# Patient Record
Sex: Male | Born: 1940 | Race: White | Hispanic: No | Marital: Married | Smoking: Former smoker
Health system: Southern US, Community
[De-identification: ages and names within clinical notes are randomized; demographics above are authoritative.]

## PROBLEM LIST (undated history)

## (undated) DIAGNOSIS — I5042 Chronic combined systolic (congestive) and diastolic (congestive) heart failure: Secondary | ICD-10-CM

## (undated) DIAGNOSIS — J439 Emphysema, unspecified: Secondary | ICD-10-CM

## (undated) DIAGNOSIS — F329 Major depressive disorder, single episode, unspecified: Secondary | ICD-10-CM

## (undated) DIAGNOSIS — I2510 Atherosclerotic heart disease of native coronary artery without angina pectoris: Secondary | ICD-10-CM

## (undated) DIAGNOSIS — I429 Cardiomyopathy, unspecified: Secondary | ICD-10-CM

## (undated) DIAGNOSIS — F419 Anxiety disorder, unspecified: Secondary | ICD-10-CM

## (undated) DIAGNOSIS — M109 Gout, unspecified: Secondary | ICD-10-CM

## (undated) DIAGNOSIS — E785 Hyperlipidemia, unspecified: Secondary | ICD-10-CM

## (undated) DIAGNOSIS — N200 Calculus of kidney: Secondary | ICD-10-CM

## (undated) DIAGNOSIS — R102 Pelvic and perineal pain: Secondary | ICD-10-CM

## (undated) DIAGNOSIS — M1990 Unspecified osteoarthritis, unspecified site: Secondary | ICD-10-CM

## (undated) DIAGNOSIS — N183 Chronic kidney disease, stage 3 unspecified: Secondary | ICD-10-CM

## (undated) DIAGNOSIS — R634 Abnormal weight loss: Secondary | ICD-10-CM

## (undated) DIAGNOSIS — I480 Paroxysmal atrial fibrillation: Secondary | ICD-10-CM

## (undated) DIAGNOSIS — E119 Type 2 diabetes mellitus without complications: Secondary | ICD-10-CM

## (undated) DIAGNOSIS — I10 Essential (primary) hypertension: Secondary | ICD-10-CM

## (undated) DIAGNOSIS — C61 Malignant neoplasm of prostate: Secondary | ICD-10-CM

## (undated) DIAGNOSIS — D649 Anemia, unspecified: Secondary | ICD-10-CM

## (undated) DIAGNOSIS — F32A Depression, unspecified: Secondary | ICD-10-CM

## (undated) DIAGNOSIS — K259 Gastric ulcer, unspecified as acute or chronic, without hemorrhage or perforation: Secondary | ICD-10-CM

## (undated) HISTORY — PX: APPENDECTOMY: SHX54

## (undated) HISTORY — PX: GASTRIC BYPASS: SHX52

## (undated) HISTORY — PX: BACK SURGERY: SHX140

## (undated) HISTORY — PX: CHOLECYSTECTOMY: SHX55

## (undated) HISTORY — PX: HEMORRHOID SURGERY: SHX153

## (undated) HISTORY — PX: OTHER SURGICAL HISTORY: SHX169

---

## 2003-11-17 ENCOUNTER — Other Ambulatory Visit: Payer: Self-pay

## 2004-01-22 ENCOUNTER — Ambulatory Visit: Payer: Self-pay | Admitting: Internal Medicine

## 2004-02-22 ENCOUNTER — Ambulatory Visit: Payer: Self-pay | Admitting: Internal Medicine

## 2004-07-31 ENCOUNTER — Ambulatory Visit: Payer: Self-pay | Admitting: Internal Medicine

## 2004-08-21 ENCOUNTER — Ambulatory Visit: Payer: Self-pay | Admitting: Internal Medicine

## 2006-06-25 ENCOUNTER — Other Ambulatory Visit: Payer: Self-pay

## 2006-06-25 ENCOUNTER — Ambulatory Visit: Payer: Self-pay | Admitting: Urology

## 2006-07-03 ENCOUNTER — Inpatient Hospital Stay: Payer: Self-pay | Admitting: Urology

## 2007-05-07 ENCOUNTER — Ambulatory Visit: Payer: Self-pay | Admitting: Rheumatology

## 2007-08-15 ENCOUNTER — Ambulatory Visit: Payer: Self-pay | Admitting: Internal Medicine

## 2007-10-09 ENCOUNTER — Ambulatory Visit: Payer: Self-pay | Admitting: Gastroenterology

## 2008-11-21 ENCOUNTER — Ambulatory Visit: Payer: Self-pay | Admitting: Radiation Oncology

## 2008-12-07 ENCOUNTER — Ambulatory Visit: Payer: Self-pay | Admitting: Urology

## 2008-12-08 ENCOUNTER — Ambulatory Visit: Payer: Self-pay | Admitting: Urology

## 2008-12-13 ENCOUNTER — Ambulatory Visit: Payer: Self-pay | Admitting: Radiation Oncology

## 2008-12-22 ENCOUNTER — Ambulatory Visit: Payer: Self-pay | Admitting: Radiation Oncology

## 2009-01-21 ENCOUNTER — Ambulatory Visit: Payer: Self-pay | Admitting: Radiation Oncology

## 2009-02-21 ENCOUNTER — Ambulatory Visit: Payer: Self-pay | Admitting: Radiation Oncology

## 2009-03-23 ENCOUNTER — Ambulatory Visit: Payer: Self-pay | Admitting: Radiation Oncology

## 2009-04-23 ENCOUNTER — Ambulatory Visit: Payer: Self-pay | Admitting: Radiation Oncology

## 2009-07-22 ENCOUNTER — Ambulatory Visit: Payer: Self-pay | Admitting: Radiation Oncology

## 2009-07-25 ENCOUNTER — Ambulatory Visit: Payer: Self-pay | Admitting: Radiation Oncology

## 2009-08-21 ENCOUNTER — Ambulatory Visit: Payer: Self-pay | Admitting: Radiation Oncology

## 2009-12-22 ENCOUNTER — Ambulatory Visit: Payer: Self-pay | Admitting: Radiation Oncology

## 2010-01-11 ENCOUNTER — Ambulatory Visit: Payer: Self-pay | Admitting: Radiation Oncology

## 2010-01-21 ENCOUNTER — Ambulatory Visit: Payer: Self-pay | Admitting: Radiation Oncology

## 2011-01-10 ENCOUNTER — Ambulatory Visit: Payer: Self-pay | Admitting: Radiation Oncology

## 2011-01-22 ENCOUNTER — Ambulatory Visit: Payer: Self-pay | Admitting: Radiation Oncology

## 2012-01-11 ENCOUNTER — Ambulatory Visit: Payer: Self-pay | Admitting: Radiation Oncology

## 2012-01-22 ENCOUNTER — Ambulatory Visit: Payer: Self-pay | Admitting: Radiation Oncology

## 2013-01-12 ENCOUNTER — Ambulatory Visit: Payer: Self-pay | Admitting: Radiation Oncology

## 2013-01-21 ENCOUNTER — Ambulatory Visit: Payer: Self-pay | Admitting: Radiation Oncology

## 2014-01-13 ENCOUNTER — Ambulatory Visit: Payer: Self-pay | Admitting: Radiation Oncology

## 2014-01-15 LAB — PSA: PSA: <0.1 ng/mL (ref 0.0–4.0)

## 2014-01-21 ENCOUNTER — Ambulatory Visit: Payer: Self-pay | Admitting: Radiation Oncology

## 2014-02-24 ENCOUNTER — Ambulatory Visit: Payer: Self-pay | Admitting: Internal Medicine

## 2015-01-11 ENCOUNTER — Other Ambulatory Visit: Payer: Self-pay | Admitting: *Deleted

## 2015-01-11 DIAGNOSIS — C61 Malignant neoplasm of prostate: Secondary | ICD-10-CM

## 2015-01-13 ENCOUNTER — Other Ambulatory Visit: Payer: Self-pay | Admitting: *Deleted

## 2015-01-13 ENCOUNTER — Encounter: Payer: Self-pay | Admitting: Radiation Oncology

## 2015-01-13 ENCOUNTER — Inpatient Hospital Stay: Payer: Medicare Other | Attending: Radiation Oncology

## 2015-01-13 ENCOUNTER — Ambulatory Visit
Admission: RE | Admit: 2015-01-13 | Discharge: 2015-01-13 | Disposition: A | Discharge status: Home / Self Care | Payer: Medicare Other | Source: Ambulatory Visit | Attending: Radiation Oncology | Admitting: Radiation Oncology

## 2015-01-13 VITALS — BP 155/84 | HR 87 | Temp 97.5°F | Ht 68.0 in | Wt 182.4 lb

## 2015-01-13 DIAGNOSIS — C61 Malignant neoplasm of prostate: Secondary | ICD-10-CM

## 2015-01-13 HISTORY — DX: Type 2 diabetes mellitus without complications: E11.9

## 2015-01-13 HISTORY — DX: Unspecified osteoarthritis, unspecified site: M19.90

## 2015-01-13 HISTORY — DX: Malignant neoplasm of prostate: C61

## 2015-01-13 LAB — PSA: PSA: 0.01 ng/mL (ref 0.00–4.00)

## 2015-01-13 NOTE — Progress Notes (Signed)
Radiation Oncology Follow up Note  Name: Gregory Tyler   Date:   01/13/2015 MRN:  [id number] DOB: [birth year]    This 74 y.o. male presents to the clinic today for follow-up for prostate cancer.  REFERRING Davon Abdelaziz: No ref. 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 He is seen today in routine follow-up and is doing fairly well. His last PSA was less than 0.01 we have repeated that today. His major complaint is some urinary incontinence which seems to be stress related but is worsening over time. He's having no diarrhea..  COMPLICATIONS OF TREATMENT: none  FOLLOW UP COMPLIANCE: keeps appointments   PHYSICAL EXAM:  BP 155/84 mmHg  Pulse 87  Temp(Src) 97.5 F (36.4 C)  Ht 5\' 8"  (1.727 m)  Wt 182 lb 6.9 oz (82.75 kg)  BMI 27.74 kg/m2 On rectal exam rectal sphincter tone is good prostatic fossa is clear without evidence of nodularity or mass no other rectal abnormalities identified. Well-developed well-nourished patient in NAD. HEENT reveals PERLA, EOMI, discs not visualized.  Oral cavity is clear. No oral mucosal lesions are identified. Neck is clear without evidence of cervical or supraclavicular adenopathy. Lungs are clear to A&P. Cardiac examination is essentially unremarkable with regular rate and rhythm without murmur rub or thrill. Abdomen is benign with no organomegaly or masses noted. Motor sensory and DTR levels are equal and symmetric in the upper and lower extremities. Cranial nerves II through XII are grossly intact. Proprioception is intact. No peripheral adenopathy or edema is identified. No motor or sensory levels are noted. Crude visual fields are within normal range.   RADIOLOGY RESULTS: No current films for review  PLAN: Present time he is doing well under good biochemical control of his prostate cancer. I have repeated PSA  level today and will report that separately. I have suggested he sees Dr. Jacqlyn Larsen in follow-up which she has been requested to do to discuss some of his urinary symptoms. Otherwise I've asked to see him back in 1 year for follow-up. Patient knows to call with any concerns.  I would like to take this opportunity for allowing me to participate in the care of your patient.Armstead Peaks., MD

## 2015-02-08 ENCOUNTER — Emergency Department: Payer: Medicare Other

## 2015-02-08 ENCOUNTER — Emergency Department
Admission: EM | Admit: 2015-02-08 | Discharge: 2015-02-08 | Disposition: A | Discharge status: Home / Self Care | Payer: Medicare Other | Attending: Emergency Medicine | Admitting: Emergency Medicine

## 2015-02-08 ENCOUNTER — Encounter: Payer: Self-pay | Admitting: *Deleted

## 2015-02-08 DIAGNOSIS — W268XXA Contact with other sharp object(s), not elsewhere classified, initial encounter: Secondary | ICD-10-CM | POA: Insufficient documentation

## 2015-02-08 DIAGNOSIS — E119 Type 2 diabetes mellitus without complications: Secondary | ICD-10-CM | POA: Insufficient documentation

## 2015-02-08 DIAGNOSIS — S90112A Contusion of left great toe without damage to nail, initial encounter: Secondary | ICD-10-CM | POA: Insufficient documentation

## 2015-02-08 DIAGNOSIS — L03032 Cellulitis of left toe: Secondary | ICD-10-CM | POA: Diagnosis not present

## 2015-02-08 DIAGNOSIS — Y9289 Other specified places as the place of occurrence of the external cause: Secondary | ICD-10-CM | POA: Diagnosis not present

## 2015-02-08 DIAGNOSIS — S99922A Unspecified injury of left foot, initial encounter: Secondary | ICD-10-CM | POA: Diagnosis present

## 2015-02-08 DIAGNOSIS — Y9389 Activity, other specified: Secondary | ICD-10-CM | POA: Diagnosis not present

## 2015-02-08 DIAGNOSIS — Z79899 Other long term (current) drug therapy: Secondary | ICD-10-CM | POA: Insufficient documentation

## 2015-02-08 DIAGNOSIS — I10 Essential (primary) hypertension: Secondary | ICD-10-CM | POA: Diagnosis not present

## 2015-02-08 DIAGNOSIS — Y998 Other external cause status: Secondary | ICD-10-CM | POA: Diagnosis not present

## 2015-02-08 DIAGNOSIS — R0609 Other forms of dyspnea: Secondary | ICD-10-CM | POA: Insufficient documentation

## 2015-02-08 DIAGNOSIS — Z87891 Personal history of nicotine dependence: Secondary | ICD-10-CM | POA: Diagnosis not present

## 2015-02-08 HISTORY — DX: Essential (primary) hypertension: I10

## 2015-02-08 LAB — BASIC METABOLIC PANEL
Anion gap: 8 (ref 5–15)
BUN: 21 mg/dL — AB (ref 6–20)
CHLORIDE: 110 mmol/L (ref 101–111)
CO2: 23 mmol/L (ref 22–32)
CREATININE: 1.33 mg/dL — AB (ref 0.61–1.24)
Calcium: 9.3 mg/dL (ref 8.9–10.3)
GFR calc Af Amer: 59 mL/min — ABNORMAL LOW (ref >60)
GFR calc non Af Amer: 51 mL/min — ABNORMAL LOW (ref >60)
Glucose, Bld: 214 mg/dL — ABNORMAL HIGH (ref 65–99)
Potassium: 4.8 mmol/L (ref 3.5–5.1)
SODIUM: 141 mmol/L (ref 135–145)

## 2015-02-08 LAB — CBC
HCT: 38 % — ABNORMAL LOW (ref 40.0–52.0)
HEMOGLOBIN: 12.4 g/dL — AB (ref 13.0–18.0)
MCH: 30.1 pg (ref 26.0–34.0)
MCHC: 32.6 g/dL (ref 32.0–36.0)
MCV: 92.2 fL (ref 80.0–100.0)
PLATELETS: 203 10*3/uL (ref 150–440)
RBC: 4.13 MIL/uL — ABNORMAL LOW (ref 4.40–5.90)
RDW: 15.2 % — ABNORMAL HIGH (ref 11.5–14.5)
WBC: 6.7 10*3/uL (ref 3.8–10.6)

## 2015-02-08 LAB — BRAIN NATRIURETIC PEPTIDE: B NATRIURETIC PEPTIDE 5: 589 pg/mL — AB (ref 0.0–100.0)

## 2015-02-08 MED ORDER — CLINDAMYCIN HCL 150 MG PO CAPS: 450.0000 mg | ORAL_CAPSULE | Freq: Three times a day (TID) | ORAL | Status: DC

## 2015-02-08 NOTE — Discharge Instructions (Signed)
Cellulitis Cellulitis is an infection of the skin and the tissue beneath it. The infected area is usually red and tender. Cellulitis occurs most often in the arms and lower legs.  CAUSES  Cellulitis is caused by bacteria that enter the skin through cracks or cuts in the skin. The most common types of bacteria that cause cellulitis are staphylococci and streptococci. SIGNS AND SYMPTOMS   Redness and warmth.  Swelling.  Tenderness or pain.  Fever. DIAGNOSIS  Your health care provider can usually determine what is wrong based on a physical exam. Blood tests may also be done. TREATMENT  Treatment usually involves taking an antibiotic medicine. HOME CARE INSTRUCTIONS   Take your antibiotic medicine as directed by your health care provider. Finish the antibiotic even if you start to feel better.  Keep the infected arm or leg elevated to reduce swelling.  Apply a warm cloth to the affected area up to 4 times per day to relieve pain.  Take medicines only as directed by your health care provider.  Keep all follow-up visits as directed by your health care provider. SEEK MEDICAL CARE IF:   You notice red streaks coming from the infected area.  Your red area gets larger or turns dark in color.  Your bone or joint underneath the infected area becomes painful after the skin has healed.  Your infection returns in the same area or another area.  You notice a swollen bump in the infected area.  You develop new symptoms.  You have a fever. SEEK IMMEDIATE MEDICAL CARE IF:   You feel very sleepy.  You develop vomiting or diarrhea.  You have a general ill feeling (malaise) with muscle aches and pains.   This information is not intended to replace advice given to you by your health care provider. Make sure you discuss any questions you have with your health care provider.   Document Released: 01/17/2005 Document Revised: 12/29/2014 Document Reviewed: 06/25/2011 Elsevier Interactive  Patient Education 2016 [place] A contusion is a deep bruise. Contusions are the result of a blunt injury to tissues and muscle fibers under the skin. The injury causes bleeding under the skin. The skin overlying the contusion may turn blue, purple, or yellow. Minor injuries will give you a painless contusion, but more severe contusions may stay painful and swollen for a few weeks.  CAUSES  This condition is usually caused by a blow, trauma, or direct force to an area of the body. SYMPTOMS  Symptoms of this condition include:  Swelling of the injured area.  Pain and tenderness in the injured area.  Discoloration. The area may have redness and then turn blue, purple, or yellow. DIAGNOSIS  This condition is diagnosed based on a physical exam and medical history. An X-ray, CT scan, or MRI may be needed to determine if there are any associated injuries, such as broken bones (fractures). TREATMENT  Specific treatment for this condition depends on what area of the body was injured. In general, the best treatment for a contusion is resting, icing, applying pressure to (compression), and elevating the injured area. This is often called the RICE strategy. Over-the-counter anti-inflammatory medicines may also be recommended for pain control.  HOME CARE INSTRUCTIONS   Rest the injured area.  If directed, apply ice to the injured area:  Put ice in a plastic bag.  Place a towel between your skin and the bag.  Leave the ice on for 20 minutes, 2-3 times per day.  If  directed, apply light compression to the injured area using an elastic bandage. Make sure the bandage is not wrapped too tightly. Remove and reapply the bandage as directed by your health care provider.  If possible, raise (elevate) the injured area above the level of your heart while you are sitting or lying down.  Take over-the-counter and prescription medicines only as told by your health care provider. SEEK  MEDICAL CARE IF:  Your symptoms do not improve after several days of treatment.  Your symptoms get worse.  You have difficulty moving the injured area. SEEK IMMEDIATE MEDICAL CARE IF:   You have severe pain.  You have numbness in a hand or foot.  Your hand or foot turns pale or cold.   This information is not intended to replace advice given to you by your health care provider. Make sure you discuss any questions you have with your health care provider.   Document Released: 01/17/2005 Document Revised: 12/29/2014 Document Reviewed: 08/25/2014 Elsevier Interactive Patient Education 2016 [place] of Breath Shortness of breath means you have trouble breathing. It could also mean that you have a medical problem. You should get immediate medical care for shortness of breath. CAUSES   Not enough oxygen in the air such as with high altitudes or a smoke-filled room.  Certain lung diseases, infections, or problems.  Heart disease or conditions, such as angina or heart failure.  Low red blood cells (anemia).  Poor physical fitness, which can cause shortness of breath when you exercise.  Chest or back injuries or stiffness.  Being overweight.  Smoking.  Anxiety, which can make you feel like you are not getting enough air. DIAGNOSIS  Serious medical problems can often be found during your physical exam. Tests may also be done to determine why you are having shortness of breath. Tests may include:  Chest X-rays.  Lung function tests.  Blood tests.  An electrocardiogram (ECG).  An ambulatory electrocardiogram. An ambulatory ECG records your heartbeat patterns over a 24-hour period.  Exercise testing.  A transthoracic echocardiogram (TTE). During echocardiography, sound waves are used to evaluate how blood flows through your heart.  A transesophageal echocardiogram (TEE).  Imaging scans. Your health care provider may not be able to find a cause for your  shortness of breath after your exam. In this case, it is important to have a follow-up exam with your health care provider as directed.  TREATMENT  Treatment for shortness of breath depends on the cause of your symptoms and can vary greatly. HOME CARE INSTRUCTIONS   Do not smoke. Smoking is a common cause of shortness of breath. If you smoke, ask for help to quit.  Avoid being around chemicals or things that may bother your breathing, such as paint fumes and dust.  Rest as needed. Slowly resume your usual activities.  If medicines were prescribed, take them as directed for the full length of time directed. This includes oxygen and any inhaled medicines.  Keep all follow-up appointments as directed by your health care provider. SEEK MEDICAL CARE IF:   Your condition does not improve in the time expected.  You have a hard time doing your normal activities even with rest.  You have any new symptoms. SEEK IMMEDIATE MEDICAL CARE IF:   Your shortness of breath gets worse.  You feel light-headed, faint, or develop a cough not controlled with medicines.  You start coughing up blood.  You have pain with breathing.  You have chest pain or  pain in your arms, shoulders, or abdomen.  You have a fever.  You are unable to walk up stairs or exercise the way you normally do. MAKE SURE YOU:  Understand these instructions.  Will watch your condition.  Will get help right away if you are not doing well or get worse.   This information is not intended to replace advice given to you by your health care provider. Make sure you discuss any questions you have with your health care provider.   Document Released: 01/02/2001 Document Revised: 04/14/2013 Document Reviewed: 06/25/2011 Elsevier Interactive Patient Education [employer].

## 2015-02-08 NOTE — ED Provider Notes (Signed)
[facility] Emergency Department Provider Note  ____________________________________________  Time seen: 5:25 PM  I have reviewed the triage vital signs and the nursing notes.   HISTORY  Chief Complaint Shortness of Breath and Toe Injury    HPI Gregory Tyler is a 74 y.o. male who complains of pain in the left great toe for the past 3 days. He was driving a stake in the ground with a sledgehammer when he missed and hit his left foot. He was only wearing a tennis shoe at the time. Initially was painful and swollen, which got better, but since yesterday he has had worsening redness swelling and pain in the left great toe. He does have diabetes.  No other acute symptoms. He has chronic exertional dyspnea which has gradually progressed over the last months to year. He does still do yard work and was recently building a Physicist, medical outside. He is a poor historian and unable to quantify any change in exercise tolerance, but does feel like it is subjectively diminished. He sleeps on 3 pillows at home for the past several weeks, but I'm unable to ascertain whether he has paroxysmal nocturnal dyspnea or frank orthopnea     Past Medical History  Diagnosis Date  . Prostate cancer ([place])   . Arthritis   . Diabetes mellitus without complication ([place])   . Hypertension   . CHF (congestive heart failure) ([place])      There are no active problems to display for this patient.    Past Surgical History  Procedure Laterality Date  . Cholecystectomy    . Appendectomy    . Gastric bypass    . Hemorrhoid surgery    . Back surgery    . Pilonidal cyst       Current Outpatient Rx  Name  Route  Sig  Dispense  Refill  . allopurinol (ZYLOPRIM) 300 MG tablet   Oral   Take by mouth.         . benazepril (LOTENSIN) 20 MG tablet   Oral   Take by mouth.         . clindamycin (CLEOCIN) 150 MG capsule   Oral   Take 3 capsules (450 mg total) by mouth 3 (three) times  daily.   90 capsule   0   . HYDROcodone-acetaminophen (NORCO/VICODIN) 5-325 MG per tablet   Oral   Take by mouth.         . hydrocortisone (ANUSOL-HC) 25 MG suppository   Rectal   Place rectally.         . isosorbide mononitrate (IMDUR) 30 MG 24 hr tablet   Oral   Take by mouth.         . magnesium oxide (MAG-OX) 400 MG tablet   Oral   Take by mouth.         . metFORMIN (GLUCOPHAGE) 500 MG tablet   Oral   Take by mouth.         . EXPIRED: metoprolol tartrate (LOPRESSOR) 25 MG tablet   Oral   Take by mouth.         . simvastatin (ZOCOR) 20 MG tablet   Oral   Take by mouth.            Allergies Codeine sulfate   No family history on file.  Social History Social History  Substance Use Topics  . Smoking status: Former Research scientist (life sciences)  . Smokeless tobacco: Current User  . Alcohol Use: No    Review of  Systems  Constitutional:   No fever or chills. No weight changes Eyes:   No blurry vision or double vision.  ENT:   No sore throat. Cardiovascular:   No chest pain. Respiratory:   Chronic dyspnea on exertion. No cough Gastrointestinal:   Negative for abdominal pain, vomiting and diarrhea.  No BRBPR or melena. Genitourinary:   Negative for dysuria, urinary retention, bloody urine, or difficulty urinating. Musculoskeletal:   Negative for back pain. Left great toe pain and swelling as above. Skin:   Negative for rash. Neurological:   Negative for headaches, focal weakness or numbness. Psychiatric:  No anxiety or depression.   Endocrine:  No hot/cold intolerance, changes in energy, or sleep difficulty.  10-point ROS otherwise negative.  ____________________________________________   PHYSICAL EXAM:  VITAL SIGNS: ED Triage Vitals  Enc Vitals Group     BP 02/08/15 1513 161/95 mmHg     Pulse Rate 02/08/15 1513 98     Resp 02/08/15 1513 18     Temp 02/08/15 1513 98.4 F (36.9 C)     Temp Source 02/08/15 1513 Oral     SpO2 02/08/15 1513 98 %      Weight 02/08/15 1513 182 lb (82.555 kg)     Height 02/08/15 1513 5\' 8"  (1.727 m)     Head Cir --      Peak Flow --      Pain Score 02/08/15 1513 10     Pain Loc --      Pain Edu? --      Excl. in [place]? --      Constitutional:   Alert and oriented. Well appearing and in no distress. Eyes:   No scleral icterus. No conjunctival pallor. PERRL. EOMI ENT   Head:   Normocephalic and atraumatic.   Nose:   No congestion/rhinnorhea. No septal hematoma   Mouth/Throat:   MMM, no pharyngeal erythema. No peritonsillar mass. No uvula shift.   Neck:   No stridor. No SubQ emphysema. No meningismus. No JVD, no hepatojugular reflex Hematological/Lymphatic/Immunilogical:   No cervical lymphadenopathy. Cardiovascular:   Irregularly irregular rhythm, rate controlled. Normal and symmetric distal pulses are present in all extremities. No murmurs, rubs, or gallops. Respiratory:   Normal respiratory effort without tachypnea nor retractions. Breath sounds are clear and equal bilaterally. No wheezes/rales/rhonchi. Tolerates lying supine without symptoms or distress. Gastrointestinal:   Soft and nontender. No distention. There is no CVA tenderness.  No rebound, rigidity, or guarding. Genitourinary:   deferred Musculoskeletal:  There is erythema tenderness and warmth of the distal left great toe just at the tip around the pulp. There is diffuse onychomycosis. The left great toenail is dusky and loose and likely to eminently fall off. Full range of motion, no bony tenderness. The rest of the left lower shoulder and other chemistries are unremarkable. Neurologic:   Normal speech and language.  CN 2-10 normal. Motor grossly intact. No pronator drift.  Normal gait. No gross focal neurologic deficits are appreciated.  Skin:    Skin is warm, dry and intact. No rash noted.  No petechiae, purpura, or bullae. Psychiatric:   Mood and affect are normal. Speech and behavior are normal. Patient exhibits appropriate  insight and judgment.  ____________________________________________    LABS (pertinent positives/negatives) (all labs ordered are listed, but only abnormal results are displayed) Labs Reviewed  CBC - Abnormal; Notable for the following:    RBC 4.13 (*)    Hemoglobin 12.4 (*)    HCT 38.0 (*)  RDW 15.2 (*)    All other components within normal limits  BASIC METABOLIC PANEL - Abnormal; Notable for the following:    Glucose, Bld 214 (*)    BUN 21 (*)    Creatinine, Ser 1.33 (*)    GFR calc non Af Amer 51 (*)    GFR calc Af Amer 59 (*)    All other components within normal limits  BRAIN NATRIURETIC PEPTIDE - Abnormal; Notable for the following:    B Natriuretic Peptide 589.0 (*)    All other components within normal limits   ____________________________________________   EKG  Interpreted by me Atrial fibrillation rate of 95. Left axis, normal intervals. Poor R progression in anterior precordial leads. Normal ST segments and T waves  ____________________________________________    RADIOLOGY  Chest x-ray unremarkable X-ray left foot unremarkable  ____________________________________________   PROCEDURES   ____________________________________________   INITIAL IMPRESSION / ASSESSMENT AND PLAN / ED COURSE  Pertinent labs & imaging results that were available during my care of the patient were reviewed by me and considered in my medical decision making (see chart for details).  Patient presents with chronic exertional dyspnea which may be related to some degree of heart failure, but does not have any evidence on exam or workup of acute heart failure or ACS today. Suspicion for PE or pneumothorax or other cardiopulmonary pathology acutely. With his diabetes and worsened symptoms of the left great toe, concerned that he is developing a superimposed cellulitis due to the recent injury. We'll start him on clindamycin. He has a follow-up plan with his primary care doctor,  Dr. Doy Hutching in 6 days, so he went to be reassessed at that point. There is no open laceration or other wound and is to be repaired. The patient that the toenails likely to fall off. No sepsis, otherwise no distress nontoxic well-appearing.     ____________________________________________   FINAL CLINICAL IMPRESSION(S) / ED DIAGNOSES  Final diagnoses:  Contusion of great toe, left, initial encounter  Cellulitis of toe of left foot  DOE (dyspnea on exertion)      Carrie Mew, MD 02/08/15 1751

## 2015-02-08 NOTE — ED Notes (Signed)
Pt reports that over the past two weeks he has felt shortness of breath, when he lays down at night to go to bed and with exertional activities. Pt also hit his left foot great toe with a sledge hammer on Saturday, c/o pain and redness in his foot.

## 2015-04-24 ENCOUNTER — Inpatient Hospital Stay
Admission: EM | Admit: 2015-04-24 | Discharge: 2015-04-27 | DRG: 291 | Disposition: A | Discharge status: Home / Self Care | Payer: Medicare Other | Attending: Specialist | Admitting: Specialist

## 2015-04-24 DIAGNOSIS — Z720 Tobacco use: Secondary | ICD-10-CM

## 2015-04-24 DIAGNOSIS — R0600 Dyspnea, unspecified: Secondary | ICD-10-CM | POA: Diagnosis not present

## 2015-04-24 DIAGNOSIS — Z9884 Bariatric surgery status: Secondary | ICD-10-CM

## 2015-04-24 DIAGNOSIS — I4891 Unspecified atrial fibrillation: Secondary | ICD-10-CM

## 2015-04-24 DIAGNOSIS — I4819 Other persistent atrial fibrillation: Secondary | ICD-10-CM | POA: Insufficient documentation

## 2015-04-24 DIAGNOSIS — Z79899 Other long term (current) drug therapy: Secondary | ICD-10-CM

## 2015-04-24 DIAGNOSIS — I10 Essential (primary) hypertension: Secondary | ICD-10-CM | POA: Insufficient documentation

## 2015-04-24 DIAGNOSIS — I481 Persistent atrial fibrillation: Secondary | ICD-10-CM | POA: Diagnosis present

## 2015-04-24 DIAGNOSIS — E1122 Type 2 diabetes mellitus with diabetic chronic kidney disease: Secondary | ICD-10-CM | POA: Diagnosis present

## 2015-04-24 DIAGNOSIS — Z8711 Personal history of peptic ulcer disease: Secondary | ICD-10-CM

## 2015-04-24 DIAGNOSIS — N183 Chronic kidney disease, stage 3 (moderate): Secondary | ICD-10-CM | POA: Diagnosis present

## 2015-04-24 DIAGNOSIS — Z9111 Patient's noncompliance with dietary regimen: Secondary | ICD-10-CM

## 2015-04-24 DIAGNOSIS — M4800 Spinal stenosis, site unspecified: Secondary | ICD-10-CM | POA: Diagnosis present

## 2015-04-24 DIAGNOSIS — E785 Hyperlipidemia, unspecified: Secondary | ICD-10-CM | POA: Diagnosis present

## 2015-04-24 DIAGNOSIS — I482 Chronic atrial fibrillation, unspecified: Secondary | ICD-10-CM | POA: Diagnosis present

## 2015-04-24 DIAGNOSIS — R0602 Shortness of breath: Secondary | ICD-10-CM | POA: Insufficient documentation

## 2015-04-24 DIAGNOSIS — M1990 Unspecified osteoarthritis, unspecified site: Secondary | ICD-10-CM | POA: Diagnosis present

## 2015-04-24 DIAGNOSIS — J9601 Acute respiratory failure with hypoxia: Secondary | ICD-10-CM | POA: Diagnosis present

## 2015-04-24 DIAGNOSIS — M109 Gout, unspecified: Secondary | ICD-10-CM | POA: Diagnosis present

## 2015-04-24 DIAGNOSIS — R0682 Tachypnea, not elsewhere classified: Secondary | ICD-10-CM | POA: Diagnosis present

## 2015-04-24 DIAGNOSIS — J449 Chronic obstructive pulmonary disease, unspecified: Secondary | ICD-10-CM | POA: Diagnosis present

## 2015-04-24 DIAGNOSIS — N179 Acute kidney failure, unspecified: Secondary | ICD-10-CM | POA: Diagnosis present

## 2015-04-24 DIAGNOSIS — Z8546 Personal history of malignant neoplasm of prostate: Secondary | ICD-10-CM

## 2015-04-24 DIAGNOSIS — I429 Cardiomyopathy, unspecified: Secondary | ICD-10-CM | POA: Diagnosis present

## 2015-04-24 DIAGNOSIS — R000 Tachycardia, unspecified: Secondary | ICD-10-CM | POA: Diagnosis present

## 2015-04-24 DIAGNOSIS — Z9049 Acquired absence of other specified parts of digestive tract: Secondary | ICD-10-CM

## 2015-04-24 DIAGNOSIS — J810 Acute pulmonary edema: Secondary | ICD-10-CM

## 2015-04-24 DIAGNOSIS — I509 Heart failure, unspecified: Secondary | ICD-10-CM

## 2015-04-24 DIAGNOSIS — Z886 Allergy status to analgesic agent status: Secondary | ICD-10-CM

## 2015-04-24 DIAGNOSIS — Z9889 Other specified postprocedural states: Secondary | ICD-10-CM

## 2015-04-24 DIAGNOSIS — I5043 Acute on chronic combined systolic (congestive) and diastolic (congestive) heart failure: Secondary | ICD-10-CM | POA: Diagnosis not present

## 2015-04-24 DIAGNOSIS — Z87442 Personal history of urinary calculi: Secondary | ICD-10-CM

## 2015-04-24 DIAGNOSIS — I1310 Hypertensive heart and chronic kidney disease without heart failure, with stage 1 through stage 4 chronic kidney disease, or unspecified chronic kidney disease: Secondary | ICD-10-CM | POA: Diagnosis present

## 2015-04-24 DIAGNOSIS — I5042 Chronic combined systolic (congestive) and diastolic (congestive) heart failure: Secondary | ICD-10-CM | POA: Diagnosis present

## 2015-04-24 DIAGNOSIS — I2510 Atherosclerotic heart disease of native coronary artery without angina pectoris: Secondary | ICD-10-CM | POA: Diagnosis present

## 2015-04-24 HISTORY — DX: Anxiety disorder, unspecified: F41.9

## 2015-04-24 HISTORY — DX: Major depressive disorder, single episode, unspecified: F32.9

## 2015-04-24 HISTORY — DX: Gout, unspecified: M10.9

## 2015-04-24 HISTORY — DX: Gastric ulcer, unspecified as acute or chronic, without hemorrhage or perforation: K25.9

## 2015-04-24 HISTORY — DX: Calculus of kidney: N20.0

## 2015-04-24 HISTORY — DX: Depression, unspecified: F32.A

## 2015-04-24 HISTORY — DX: Hyperlipidemia, unspecified: E78.5

## 2015-04-24 HISTORY — DX: Anemia, unspecified: D64.9

## 2015-04-24 HISTORY — DX: Emphysema, unspecified: J43.9

## 2015-04-24 HISTORY — DX: Atherosclerotic heart disease of native coronary artery without angina pectoris: I25.10

## 2015-04-24 MED ORDER — FUROSEMIDE 10 MG/ML IJ SOLN
40.0000 mg | Freq: Once | INTRAMUSCULAR | Status: AC
  Administered 2015-04-24: 40 mg via INTRAVENOUS

## 2015-04-25 ENCOUNTER — Inpatient Hospital Stay (HOSPITAL_COMMUNITY)
Admit: 2015-04-25 | Discharge: 2015-04-25 | Disposition: A | Discharge status: Home / Self Care | Payer: Medicare Other | Attending: Cardiovascular Disease | Admitting: Cardiovascular Disease

## 2015-04-25 ENCOUNTER — Emergency Department: Payer: Medicare Other

## 2015-04-25 DIAGNOSIS — Z9884 Bariatric surgery status: Secondary | ICD-10-CM | POA: Diagnosis not present

## 2015-04-25 DIAGNOSIS — M109 Gout, unspecified: Secondary | ICD-10-CM | POA: Diagnosis present

## 2015-04-25 DIAGNOSIS — I4891 Unspecified atrial fibrillation: Secondary | ICD-10-CM | POA: Diagnosis not present

## 2015-04-25 DIAGNOSIS — I481 Persistent atrial fibrillation: Secondary | ICD-10-CM | POA: Diagnosis present

## 2015-04-25 DIAGNOSIS — R0602 Shortness of breath: Secondary | ICD-10-CM | POA: Insufficient documentation

## 2015-04-25 DIAGNOSIS — M1990 Unspecified osteoarthritis, unspecified site: Secondary | ICD-10-CM | POA: Diagnosis present

## 2015-04-25 DIAGNOSIS — Z9049 Acquired absence of other specified parts of digestive tract: Secondary | ICD-10-CM | POA: Diagnosis not present

## 2015-04-25 DIAGNOSIS — N179 Acute kidney failure, unspecified: Secondary | ICD-10-CM | POA: Diagnosis present

## 2015-04-25 DIAGNOSIS — R0600 Dyspnea, unspecified: Secondary | ICD-10-CM

## 2015-04-25 DIAGNOSIS — I2510 Atherosclerotic heart disease of native coronary artery without angina pectoris: Secondary | ICD-10-CM | POA: Diagnosis present

## 2015-04-25 DIAGNOSIS — J9601 Acute respiratory failure with hypoxia: Secondary | ICD-10-CM | POA: Diagnosis present

## 2015-04-25 DIAGNOSIS — I482 Chronic atrial fibrillation, unspecified: Secondary | ICD-10-CM | POA: Diagnosis present

## 2015-04-25 DIAGNOSIS — I10 Essential (primary) hypertension: Secondary | ICD-10-CM

## 2015-04-25 DIAGNOSIS — N183 Chronic kidney disease, stage 3 (moderate): Secondary | ICD-10-CM | POA: Diagnosis present

## 2015-04-25 DIAGNOSIS — R0682 Tachypnea, not elsewhere classified: Secondary | ICD-10-CM | POA: Diagnosis present

## 2015-04-25 DIAGNOSIS — I1310 Hypertensive heart and chronic kidney disease without heart failure, with stage 1 through stage 4 chronic kidney disease, or unspecified chronic kidney disease: Secondary | ICD-10-CM | POA: Diagnosis present

## 2015-04-25 DIAGNOSIS — Z9111 Patient's noncompliance with dietary regimen: Secondary | ICD-10-CM | POA: Diagnosis not present

## 2015-04-25 DIAGNOSIS — Z720 Tobacco use: Secondary | ICD-10-CM | POA: Diagnosis not present

## 2015-04-25 DIAGNOSIS — Z8711 Personal history of peptic ulcer disease: Secondary | ICD-10-CM | POA: Diagnosis not present

## 2015-04-25 DIAGNOSIS — J449 Chronic obstructive pulmonary disease, unspecified: Secondary | ICD-10-CM | POA: Diagnosis present

## 2015-04-25 DIAGNOSIS — Z9889 Other specified postprocedural states: Secondary | ICD-10-CM | POA: Diagnosis not present

## 2015-04-25 DIAGNOSIS — I5043 Acute on chronic combined systolic (congestive) and diastolic (congestive) heart failure: Secondary | ICD-10-CM | POA: Insufficient documentation

## 2015-04-25 DIAGNOSIS — I4819 Other persistent atrial fibrillation: Secondary | ICD-10-CM | POA: Insufficient documentation

## 2015-04-25 DIAGNOSIS — Z87442 Personal history of urinary calculi: Secondary | ICD-10-CM | POA: Diagnosis not present

## 2015-04-25 DIAGNOSIS — Z8546 Personal history of malignant neoplasm of prostate: Secondary | ICD-10-CM | POA: Diagnosis not present

## 2015-04-25 DIAGNOSIS — Z79899 Other long term (current) drug therapy: Secondary | ICD-10-CM | POA: Diagnosis not present

## 2015-04-25 DIAGNOSIS — Z886 Allergy status to analgesic agent status: Secondary | ICD-10-CM | POA: Diagnosis not present

## 2015-04-25 DIAGNOSIS — J810 Acute pulmonary edema: Secondary | ICD-10-CM | POA: Diagnosis not present

## 2015-04-25 DIAGNOSIS — R000 Tachycardia, unspecified: Secondary | ICD-10-CM | POA: Diagnosis present

## 2015-04-25 DIAGNOSIS — E785 Hyperlipidemia, unspecified: Secondary | ICD-10-CM | POA: Diagnosis present

## 2015-04-25 DIAGNOSIS — M4800 Spinal stenosis, site unspecified: Secondary | ICD-10-CM | POA: Diagnosis present

## 2015-04-25 DIAGNOSIS — E1122 Type 2 diabetes mellitus with diabetic chronic kidney disease: Secondary | ICD-10-CM | POA: Diagnosis present

## 2015-04-25 DIAGNOSIS — I429 Cardiomyopathy, unspecified: Secondary | ICD-10-CM | POA: Diagnosis present

## 2015-04-25 DIAGNOSIS — I5042 Chronic combined systolic (congestive) and diastolic (congestive) heart failure: Secondary | ICD-10-CM | POA: Diagnosis present

## 2015-04-25 LAB — CBC
HCT: 37.6 % — ABNORMAL LOW (ref 40.0–52.0)
HEMATOCRIT: 42.6 % (ref 40.0–52.0)
HEMOGLOBIN: 12.3 g/dL — AB (ref 13.0–18.0)
HEMOGLOBIN: 13.4 g/dL (ref 13.0–18.0)
MCH: 28.9 pg (ref 26.0–34.0)
MCH: 29.4 pg (ref 26.0–34.0)
MCHC: 31.6 g/dL — ABNORMAL LOW (ref 32.0–36.0)
MCHC: 32.8 g/dL (ref 32.0–36.0)
MCV: 89.6 fL (ref 80.0–100.0)
MCV: 91.5 fL (ref 80.0–100.0)
Platelets: 215 10*3/uL (ref 150–440)
Platelets: 264 10*3/uL (ref 150–440)
RBC: 4.2 MIL/uL — AB (ref 4.40–5.90)
RBC: 4.65 MIL/uL (ref 4.40–5.90)
RDW: 15.9 % — ABNORMAL HIGH (ref 11.5–14.5)
RDW: 16.1 % — AB (ref 11.5–14.5)
WBC: 10.7 10*3/uL — ABNORMAL HIGH (ref 3.8–10.6)
WBC: 11.6 10*3/uL — AB (ref 3.8–10.6)

## 2015-04-25 LAB — BASIC METABOLIC PANEL
ANION GAP: 10 (ref 5–15)
BUN: 32 mg/dL — ABNORMAL HIGH (ref 6–20)
CALCIUM: 9.2 mg/dL (ref 8.9–10.3)
CHLORIDE: 106 mmol/L (ref 101–111)
CO2: 22 mmol/L (ref 22–32)
Creatinine, Ser: 1.81 mg/dL — ABNORMAL HIGH (ref 0.61–1.24)
GFR calc non Af Amer: 35 mL/min — ABNORMAL LOW (ref >60)
GFR, EST AFRICAN AMERICAN: 40 mL/min — AB (ref >60)
Glucose, Bld: 182 mg/dL — ABNORMAL HIGH (ref 65–99)
Potassium: 4.4 mmol/L (ref 3.5–5.1)
Sodium: 138 mmol/L (ref 135–145)

## 2015-04-25 LAB — GLUCOSE, CAPILLARY
GLUCOSE-CAPILLARY: 172 mg/dL — AB (ref 65–99)
GLUCOSE-CAPILLARY: 116 mg/dL — AB (ref 65–99)
GLUCOSE-CAPILLARY: 170 mg/dL — AB (ref 65–99)
Glucose-Capillary: 167 mg/dL — ABNORMAL HIGH (ref 65–99)
Glucose-Capillary: 145 mg/dL — ABNORMAL HIGH (ref 65–99)

## 2015-04-25 LAB — URINALYSIS COMPLETE WITH MICROSCOPIC (ARMC ONLY)
BACTERIA UA: NONE SEEN
Bilirubin Urine: NEGATIVE
Glucose, UA: NEGATIVE mg/dL
HGB URINE DIPSTICK: NEGATIVE
Ketones, ur: NEGATIVE mg/dL
Leukocytes, UA: NEGATIVE
Nitrite: NEGATIVE
Protein, ur: NEGATIVE mg/dL
SQUAMOUS EPITHELIAL / LPF: NONE SEEN
Specific Gravity, Urine: 1.005 (ref 1.005–1.030)
pH: 5 (ref 5.0–8.0)

## 2015-04-25 LAB — COMPREHENSIVE METABOLIC PANEL
ALBUMIN: 4.4 g/dL (ref 3.5–5.0)
ALK PHOS: 131 U/L — AB (ref 38–126)
ALT: 31 U/L (ref 17–63)
AST: 35 U/L (ref 15–41)
Anion gap: 11 (ref 5–15)
BILIRUBIN TOTAL: 0.9 mg/dL (ref 0.3–1.2)
BUN: 28 mg/dL — AB (ref 6–20)
CO2: 22 mmol/L (ref 22–32)
CREATININE: 2.1 mg/dL — AB (ref 0.61–1.24)
Calcium: 9.2 mg/dL (ref 8.9–10.3)
Chloride: 107 mmol/L (ref 101–111)
GFR calc Af Amer: 34 mL/min — ABNORMAL LOW (ref >60)
GFR calc non Af Amer: 29 mL/min — ABNORMAL LOW (ref >60)
GLUCOSE: 249 mg/dL — AB (ref 65–99)
POTASSIUM: 4.5 mmol/L (ref 3.5–5.1)
Sodium: 140 mmol/L (ref 135–145)
TOTAL PROTEIN: 8.1 g/dL (ref 6.5–8.1)

## 2015-04-25 LAB — BRAIN NATRIURETIC PEPTIDE: B Natriuretic Peptide: 765 pg/mL — ABNORMAL HIGH (ref 0.0–100.0)

## 2015-04-25 LAB — TROPONIN I: Troponin I: 0.04 ng/mL — ABNORMAL HIGH (ref <0.031)

## 2015-04-25 LAB — MRSA PCR SCREENING: MRSA by PCR: NEGATIVE

## 2015-04-25 MED ORDER — DILTIAZEM HCL 60 MG PO TABS
60.0000 mg | ORAL_TABLET | Freq: Three times a day (TID) | ORAL | Status: DC
  Administered 2015-04-25: 60 mg via ORAL
  Filled 2015-04-25: qty 1

## 2015-04-25 MED ORDER — ACETAMINOPHEN 325 MG PO TABS: 650.0000 mg | ORAL_TABLET | Freq: Four times a day (QID) | ORAL | Status: DC | PRN

## 2015-04-25 MED ORDER — FLEET ENEMA 7-19 GM/118ML RE ENEM: 1.0000 | ENEMA | Freq: Once | RECTAL | Status: DC | PRN

## 2015-04-25 MED ORDER — INSULIN ASPART 100 UNIT/ML ~~LOC~~ SOLN: 0.0000 [IU] | Freq: Every day | SUBCUTANEOUS | Status: DC

## 2015-04-25 MED ORDER — FUROSEMIDE 10 MG/ML IJ SOLN
40.0000 mg | Freq: Two times a day (BID) | INTRAMUSCULAR | Status: DC
  Administered 2015-04-25 – 2015-04-27 (×5): 40 mg via INTRAVENOUS
  Filled 2015-04-25 (×5): qty 4

## 2015-04-25 MED ORDER — RIVAROXABAN 15 MG PO TABS
15.0000 mg | ORAL_TABLET | Freq: Every day | ORAL | Status: DC
  Administered 2015-04-25 – 2015-04-26 (×2): 15 mg via ORAL
  Filled 2015-04-25 (×2): qty 1

## 2015-04-25 MED ORDER — SODIUM CHLORIDE 0.9 % IV SOLN: 250.0000 mL | INTRAVENOUS | Status: DC | PRN

## 2015-04-25 MED ORDER — DILTIAZEM HCL ER COATED BEADS 120 MG PO CP24
120.0000 mg | ORAL_CAPSULE | Freq: Every day | ORAL | Status: DC
  Administered 2015-04-25 – 2015-04-26 (×2): 120 mg via ORAL
  Filled 2015-04-25 (×2): qty 1

## 2015-04-25 MED ORDER — ALLOPURINOL 100 MG PO TABS
300.0000 mg | ORAL_TABLET | Freq: Every day | ORAL | Status: DC
  Administered 2015-04-26 – 2015-04-27 (×2): 300 mg via ORAL
  Filled 2015-04-25 (×2): qty 3

## 2015-04-25 MED ORDER — ACETAMINOPHEN 650 MG RE SUPP: 650.0000 mg | Freq: Four times a day (QID) | RECTAL | Status: DC | PRN

## 2015-04-25 MED ORDER — ONDANSETRON HCL 4 MG PO TABS: 4.0000 mg | ORAL_TABLET | Freq: Four times a day (QID) | ORAL | Status: DC | PRN

## 2015-04-25 MED ORDER — SODIUM CHLORIDE 0.9 % IJ SOLN
3.0000 mL | Freq: Two times a day (BID) | INTRAMUSCULAR | Status: DC
  Administered 2015-04-25 (×2): 3 mL via INTRAVENOUS

## 2015-04-25 MED ORDER — CHLORHEXIDINE GLUCONATE 0.12 % MT SOLN
15.0000 mL | Freq: Two times a day (BID) | OROMUCOSAL | Status: DC
  Administered 2015-04-25: 15 mL via OROMUCOSAL

## 2015-04-25 MED ORDER — ISOSORBIDE MONONITRATE ER 30 MG PO TB24: 30.0000 mg | ORAL_TABLET | Freq: Every day | ORAL | Status: DC

## 2015-04-25 MED ORDER — ISOSORBIDE MONONITRATE ER 30 MG PO TB24
30.0000 mg | ORAL_TABLET | Freq: Every day | ORAL | Status: DC
  Administered 2015-04-26 – 2015-04-27 (×2): 30 mg via ORAL
  Filled 2015-04-25 (×2): qty 1

## 2015-04-25 MED ORDER — POLYETHYLENE GLYCOL 3350 17 G PO PACK: 17.0000 g | PACK | Freq: Every day | ORAL | Status: DC | PRN

## 2015-04-25 MED ORDER — IPRATROPIUM-ALBUTEROL 0.5-2.5 (3) MG/3ML IN SOLN: 3.0000 mL | RESPIRATORY_TRACT | Status: DC | PRN

## 2015-04-25 MED ORDER — ONDANSETRON HCL 4 MG/2ML IJ SOLN: 4.0000 mg | Freq: Four times a day (QID) | INTRAMUSCULAR | Status: DC | PRN

## 2015-04-25 MED ORDER — HYDROCODONE-ACETAMINOPHEN 5-325 MG PO TABS: 1.0000 | ORAL_TABLET | ORAL | Status: DC | PRN

## 2015-04-25 MED ORDER — INSULIN ASPART 100 UNIT/ML ~~LOC~~ SOLN
0.0000 [IU] | Freq: Three times a day (TID) | SUBCUTANEOUS | Status: DC
Start: 2015-04-25 — End: 2015-04-27
  Administered 2015-04-25: 3 [IU] via SUBCUTANEOUS
  Administered 2015-04-25 – 2015-04-26 (×2): 2 [IU] via SUBCUTANEOUS
  Administered 2015-04-26: 5 [IU] via SUBCUTANEOUS
  Administered 2015-04-27 (×2): 3 [IU] via SUBCUTANEOUS
  Filled 2015-04-25: qty 3
  Filled 2015-04-25: qty 2
  Filled 2015-04-25: qty 3
  Filled 2015-04-25: qty 2
  Filled 2015-04-25: qty 3
  Filled 2015-04-25: qty 5

## 2015-04-25 MED ORDER — METOPROLOL TARTRATE 50 MG PO TABS
50.0000 mg | ORAL_TABLET | Freq: Two times a day (BID) | ORAL | Status: DC
  Administered 2015-04-25 – 2015-04-27 (×6): 50 mg via ORAL
  Filled 2015-04-25 (×6): qty 1

## 2015-04-25 MED ORDER — MAGNESIUM OXIDE 400 (241.3 MG) MG PO TABS
400.0000 mg | ORAL_TABLET | Freq: Every day | ORAL | Status: DC
  Administered 2015-04-25 – 2015-04-27 (×3): 400 mg via ORAL
  Filled 2015-04-25 (×3): qty 1

## 2015-04-25 MED ORDER — FUROSEMIDE 10 MG/ML IJ SOLN
INTRAMUSCULAR | Status: AC
  Administered 2015-04-24: 40 mg via INTRAVENOUS
  Filled 2015-04-25: qty 4

## 2015-04-25 MED ORDER — BUDESONIDE-FORMOTEROL FUMARATE 160-4.5 MCG/ACT IN AERO
2.0000 | INHALATION_SPRAY | Freq: Two times a day (BID) | RESPIRATORY_TRACT | Status: DC
  Administered 2015-04-25 – 2015-04-27 (×5): 2 via RESPIRATORY_TRACT
  Filled 2015-04-25: qty 6

## 2015-04-25 MED ORDER — BISACODYL 5 MG PO TBEC: 5.0000 mg | DELAYED_RELEASE_TABLET | Freq: Every day | ORAL | Status: DC | PRN

## 2015-04-25 MED ORDER — SIMVASTATIN 20 MG PO TABS: 20.0000 mg | ORAL_TABLET | Freq: Every evening | ORAL | Status: DC

## 2015-04-25 MED ORDER — HEPARIN SODIUM (PORCINE) 5000 UNIT/ML IJ SOLN
5000.0000 [IU] | Freq: Three times a day (TID) | INTRAMUSCULAR | Status: DC
  Administered 2015-04-25: 5000 [IU] via SUBCUTANEOUS
  Filled 2015-04-25: qty 1

## 2015-04-25 MED ORDER — SODIUM CHLORIDE 0.9 % IJ SOLN
3.0000 mL | INTRAMUSCULAR | Status: DC | PRN
  Administered 2015-04-27: 3 mL via INTRAVENOUS
  Filled 2015-04-25: qty 10

## 2015-04-25 MED ORDER — DILTIAZEM HCL 100 MG IV SOLR
5.0000 mg/h | INTRAVENOUS | Status: DC
  Administered 2015-04-25: 5 mg/h via INTRAVENOUS
  Filled 2015-04-25: qty 100

## 2015-04-25 MED ORDER — ASPIRIN EC 81 MG PO TBEC
81.0000 mg | DELAYED_RELEASE_TABLET | Freq: Every day | ORAL | Status: DC
  Administered 2015-04-25: 81 mg via ORAL
  Filled 2015-04-25: qty 1

## 2015-04-25 MED ORDER — DILTIAZEM HCL 25 MG/5ML IV SOLN
10.0000 mg | Freq: Once | INTRAVENOUS | Status: AC
  Administered 2015-04-25: 5 mg via INTRAVENOUS
  Filled 2015-04-25: qty 5

## 2015-04-25 NOTE — ED Notes (Signed)
Md ordered 1" of nitro paste removed. Removed 1" of paste

## 2015-04-25 NOTE — Progress Notes (Signed)
   04/25/15 1000  Clinical Encounter Type  Visited With Patient and family together  Visit Type Initial;Spiritual support  Referral From Nurse  Consult/Referral To Chaplain  Spiritual Encounters  Spiritual Needs Prayer  Stress Factors  Patient Stress Factors Health changes  Responded to order for patient prayer. Spent time talking with patient and son. Patient concerned about wife at home who has Parkinson's. Gregory Tyler

## 2015-04-25 NOTE — Consult Note (Addendum)
Cardiology Consultation Note  Patient ID: Gregory Tyler, MRN: [id number], DOB/AGE: 76-19-1942 76 y.o. Admit date: 04/24/2015   Date of Consult: 04/25/2015 Primary Physician: [name], MD Primary Cardiologist: Patient wants to change to [facility]  Chief Complaint: SOB, wheezing Reason for Consult: Atrial fib with RVR  HPI: 75 y.o. male with h/o atrial fibrillation since at least 01/2014, prior cardiac cath 2015 (results unavailable), h/o acute on chronic diastolic CHF (per the outside notes), HTN, hyperlipidemia, presenting with SOB, wheezing. Cardiology consulted by Dr. Darvin Neighbours for atrial fib with RVR.  He reports feeling well last night, went to his son's house for dinner.  After he got home, had some wheezing, started using his albuterol inhaler, 4 times Shortly after developed acute shortness of breath, worsening wheezing He presented to the emergency room,  Noted to have atrial fibrillation with RVR, severely elevated blood pressure, acute respiratory distress requiring BIPAP. He is given Lasix IV with good urine output, improvement of his symptoms. Started on diltiazem initially IV, changed to by mouth.  This morning feels much better, close to his baseline Reports having clear cough for several days if not several weeks  Previously seen by outside cardiologist, was noted to be in atrial fibrillation October 2015 History of gastric ulcer in the notes but patient and patient's son deny any recent bleeding or any knowledge of ulcer. Decision made October 2015 not to start anticoagulation. No recent falls, but does walk with a cane  Lab work in the ER showing creatinine above his baseline 1.8, 2.0 Previously was normal   Past Medical History  Diagnosis Date  . Prostate cancer ([place])   . Arthritis   . Diabetes mellitus without complication ([place])   . Hypertension   . CHF (congestive heart failure) ([place])   . Emphysema lung ([place])   . Hyperlipidemia   . Gout   . Gastric ulcer     . Anemia   . Nephrolithiasis   . Coronary artery disease   . Anxiety   . Depression       Most Recent Cardiac Studies: Echo prelim shows mildly depressed ejection fraction, 40%, mild diffuse hypokinesis, moderate elevated right heart pressures    Surgical History:  Past Surgical History  Procedure Laterality Date  . Cholecystectomy    . Appendectomy    . Gastric bypass    . Hemorrhoid surgery    . Back surgery    . Pilonidal cyst       Home Meds: Prior to Admission medications   Medication Sig Start Date End Date Taking? Authorizing Provider  allopurinol (ZYLOPRIM) 300 MG tablet Take 300 mg by mouth daily.    Yes Historical Provider, MD  benazepril (LOTENSIN) 20 MG tablet Take 20 mg by mouth daily.  11/12/14  Yes Historical Provider, MD  budesonide-formoterol (SYMBICORT) 160-4.5 MCG/ACT inhaler Inhale 2 puffs into the lungs 2 (two) times daily.   Yes Historical Provider, MD  HYDROcodone-acetaminophen (NORCO/VICODIN) 5-325 MG per tablet Take 1 tablet by mouth every 4 (four) hours as needed for moderate pain.    Yes Historical Provider, MD  isosorbide mononitrate (IMDUR) 30 MG 24 hr tablet Take 30 mg by mouth daily.   Yes Historical Provider, MD  magnesium oxide (MAG-OX) 400 MG tablet Take 400 mg by mouth daily.    Yes Historical Provider, MD  metFORMIN (GLUCOPHAGE) 500 MG tablet Take 500 mg by mouth 2 (two) times daily.    Yes Historical Provider, MD  metoprolol tartrate (LOPRESSOR) 25 MG tablet Take  25 mg by mouth 2 (two) times daily.    Yes Historical Provider, MD  simvastatin (ZOCOR) 20 MG tablet Take 20 mg by mouth every evening.    Yes Historical Provider, MD  clindamycin (CLEOCIN) 150 MG capsule Take 3 capsules (450 mg total) by mouth 3 (three) times daily. Patient not taking: Reported on 04/25/2015 02/08/15   Carrie Mew, MD    Inpatient Medications:  . [START ON 04/26/2015] allopurinol  300 mg Oral Daily  . budesonide-formoterol  2 puff Inhalation BID  . chlorhexidine   15 mL Mouth Rinse BID  . diltiazem  120 mg Oral Daily  . furosemide  40 mg Intravenous BID  . heparin  5,000 Units Subcutaneous 3 times per day  . insulin aspart  0-15 Units Subcutaneous TID WC  . insulin aspart  0-5 Units Subcutaneous QHS  . [START ON 04/26/2015] isosorbide mononitrate  30 mg Oral Daily  . magnesium oxide  400 mg Oral Daily  . metoprolol tartrate  50 mg Oral BID  . rivaroxaban  15 mg Oral Daily  . sodium chloride  3 mL Intravenous Q12H  . sodium chloride  3 mL Intravenous Q12H      Allergies:  Allergies  Allergen Reactions  . Codeine Sulfate     Other reaction(s): Other (See Comments) BLISTERS    Social History   Social History  . Marital Status: Married    Spouse Name: N/A  . Number of Children: N/A  . Years of Education: N/A   Occupational History  . Not on file.   Social History Main Topics  . Smoking status: Former Research scientist (life sciences)  . Smokeless tobacco: Current User  . Alcohol Use: No  . Drug Use: No  . Sexual Activity: Not on file   Other Topics Concern  . Not on file   Social History Narrative     History reviewed. No pertinent family history.   Review of Systems: Review of Systems  Constitutional: Negative.   Respiratory: Positive for cough and shortness of breath.   Cardiovascular: Negative.   Gastrointestinal: Negative.   Musculoskeletal: Negative.   Neurological: Negative.   Psychiatric/Behavioral: Negative.   All other systems reviewed and are negative.   Labs:  Recent Labs  04/25/15 0001  TROPONINI 0.04*   Lab Results  Component Value Date   WBC 10.7* 04/25/2015   HGB 12.3* 04/25/2015   HCT 37.6* 04/25/2015   MCV 89.6 04/25/2015   PLT 215 04/25/2015    Recent Labs Lab 04/25/15 0001 04/25/15 0310  NA 140 138  K 4.5 4.4  CL 107 106  CO2 22 22  BUN 28* 32*  CREATININE 2.10* 1.81*  CALCIUM 9.2 9.2  PROT 8.1  --   BILITOT 0.9  --   ALKPHOS 131*  --   ALT 31  --   AST 35  --   GLUCOSE 249* 182*   No results  found for: CHOL, HDL, LDLCALC, TRIG No results found for: DDIMER  Radiology/Studies:  Dg Chest Portable 1 View  04/25/2015  CLINICAL DATA:  Shortness of breath. EXAM: PORTABLE CHEST 1 VIEW COMPARISON:  02/06/2015 FINDINGS: The heart is enlarged. Bilateral perihilar opacities and indistinct pulmonary vasculature concerning for pulmonary edema, right greater than left. No confluent airspace disease, large pleural effusion or pneumothorax. IMPRESSION: Cardiomegaly and pulmonary edema, most consistent with CHF. Electronically Signed   By: Jeb Levering M.D.   On: 04/25/2015 01:05    EKG: atrial fibrillation with rate 120 bpm, IVCD  Weights:  Filed Weights   04/25/15 0019 04/25/15 0251  Weight: 182 lb (82.555 kg) 180 lb 1.9 oz (81.7 kg)     Physical Exam:tele showing atrial fibrillation, rate 60 to 90 bpm Blood pressure 119/61, pulse 68, temperature 98 F (36.7 C), temperature source Oral, resp. rate 18, height 5\' 8"  (1.727 m), weight 180 lb 1.9 oz (81.7 kg), SpO2 96 %. Body mass index is 27.39 kg/(m^2). General: Well developed, well nourished, in no acute distress. Head: Normocephalic, atraumatic, sclera non-icteric, no xanthomas, nares are without discharge.  Neck: Negative for carotid bruits. JVD not elevated. Lungs: Clear bilaterally to auscultation without wheezes, rales, or rhonchi. Breathing is unlabored. Heart: IRRR, 1+ SEM LSB, No rubs, or gallops appreciated. Abdomen: Soft, non-tender, non-distended with normoactive bowel sounds. No hepatomegaly. No rebound/guarding. No obvious abdominal masses. Msk:  Strength and tone appear normal for age. Extremities: No clubbing or cyanosis. No edema.  Distal pedal pulses are 2+ and equal bilaterally. Neuro: Alert and oriented X 3. No facial asymmetry. No focal deficit. Moves all extremities spontaneously. Psych:  Responds to questions appropriately with a normal affect.    Assessment and Plan:   1. Persistent atrial fibrillation First  noted October 2015, again October 2016, A. fib with RVR on today's visit in the setting of acute respiratory distress. --- Rate improved on diltiazem by mouth We'll continue metoprolol 25 mg twice a day, change diltiazem to 120 g daily Long discussion concerning anticoagulation: Was previously not started by cardiology October 2015 as an outpatient  Patient and family  Denies any gastric ulcer, certainly no bleeding recently  Regino Ramirez or more, would be inclined to start anticoagulation He Discussed risk and benefit with the family, they are where bleeding risk Wife has a stroke, they do not want him to have a stroke. He is the primary caretaker for her  2) acute respiratory distress Likely secondary to acute on chronic diastolic/systolic CHF in the setting of atrial fibrillation with RVR --Symptoms improved with Lasix for diuresis, rate control with diltiazem Did not appear to be on Lasix as an outpatient  3) acute on chronic diastolic and systolic CHF Previously documented in the notes dating back to October 2015, has not been actively managed this year --Will likely need Lasix 20 mg daily with potassium 10 Close outpatient follow-up to monitor renal function  4) acute renal failure: Would hold benazepril for now Close monitoring of renal function on Lasix  5) CAD: Prior cardiac catheterization results unavailable, we'll try to obtain for our system Family denies any knowledge of any blockages Continue statin, hold aspirin  6) Cardiomyopathy Ejection fraction mildly depressed, final echocardiogram report  pending   would obtain cardiac catheterization report   currently no plan for ischemia workup  Will order repeat Troponin this AM   Signed, Esmond Plants, [facility] 04/25/2015, 12:21 PM

## 2015-04-25 NOTE — Progress Notes (Signed)
Patient has been weaned off bipap to nasal cannula. Tolerating well. Will continue to monitor

## 2015-04-25 NOTE — ED Notes (Signed)
Pt's BP decreased to 105/90. MD Dahlia Client ordered holding dose of diltiazem.

## 2015-04-25 NOTE — Progress Notes (Signed)
eLink Physician-Brief Progress Note Patient Name: Gregory Tyler DOB: 1941-01-28 MRN: [id number]   Date of Service  04/25/2015  HPI/Events of Note  75 yo male with PMH of HTN, Hyperlipidemia, DM, Prostate CA, CAD and Diastolic Heart Failure. Admitted with AFIB with RVR and SOB. Current medical regimen includes Cardizem IV infusion, Lasix IV, Metoprolol, ASA, Symbicort, Heparin [place], Novolog SSI, Magnesium PO and BiPAP PRN. BP = 135/95 and HR = 98. Sat = 96% and RR = 23. Management per Hospitalist Service. Cardiology has been consulted.   eICU Interventions  Continue current management.      Intervention Category Evaluation Type: New Patient Evaluation  [name] 04/25/2015, 3:12 AM

## 2015-04-25 NOTE — Progress Notes (Signed)
Patient transferred to the unit. Patient is sitting in bed with no complaints of pain, no SOB. Family at bedside. Patient oriented to room, and aware of safety plan. Patient has no wounds or pressure ulcers noted on exam. Skin assessed with Tanya B.  Tele box verified with Teressa Senter, RN and [place], [place]. Will continue to monitor patient. Horton Finer

## 2015-04-25 NOTE — H&P (Signed)
Gregory Tyler at [place] NAME: Gregory Tyler    MR#:  [id number]  DATE OF BIRTH:  19-May-1940  DATE OF ADMISSION:  04/24/2015  PRIMARY CARE PHYSICIAN: [name], MD   REQUESTING/REFERRING PHYSICIAN: Dr. Dahlia Client  CHIEF COMPLAINT:   Chief Complaint  Patient presents with  . Respiratory Distress    HISTORY OF PRESENT ILLNESS:  Gregory Tyler  is a 75 y.o. male with a known history of hypertension, diabetes, diastolic CHF, COPD who is noncompliant with his diet was in stool the emergency room complaining of acute onset of shortness of breath yesterday night. Patient called EMS and had to be placed on a CPAP brought to the emergency room. He was found to have blood pressure 200/120 heart rate in the 170s in atrial fibrillation. Was given 20 mg IV Cardizem and placed on the Nitropaste by EMS. He complains of orthopnea and cough. Patient is not compliant with his salt or fluid intake as per wife at bedside. Has been taking all his medications regularly. In the emergency room patient is found to be severely tachypneic and hypoxia and tachycardia due to atrial fibrillation in the 140s. He has received total of 30 mg IV Cardizem.  Patient sees Dr. Nehemiah Massed of cardiology but is wanting to switch to a different cardiology group.  PAST MEDICAL HISTORY:   Past Medical History  Diagnosis Date  . Prostate cancer ([place])   . Arthritis   . Diabetes mellitus without complication ([place])   . Hypertension   . CHF (congestive heart failure) ([place])   . Emphysema lung ([place])   . Hyperlipidemia   . Gout   . Gastric ulcer   . Anemia   . Nephrolithiasis   . Coronary artery disease   . Anxiety   . Depression     PAST SURGICAL HISTORY:   Past Surgical History  Procedure Laterality Date  . Cholecystectomy    . Appendectomy    . Gastric bypass    . Hemorrhoid surgery    . Back surgery    . Pilonidal cyst      SOCIAL HISTORY:   Social  History  Substance Use Topics  . Smoking status: Former Research scientist (life sciences)  . Smokeless tobacco: Current User  . Alcohol Use: No    FAMILY HISTORY:  No family history on file.  DRUG ALLERGIES:   Allergies  Allergen Reactions  . Codeine Sulfate     Other reaction(s): Other (See Comments) BLISTERS    REVIEW OF SYSTEMS:   Review of Systems  Constitutional: Positive for malaise/fatigue. Negative for fever and chills.  HENT: Negative for sore throat.   Eyes: Negative for blurred vision, double vision and pain.  Respiratory: Positive for shortness of breath. Negative for cough, hemoptysis and wheezing.   Cardiovascular: Positive for palpitations and orthopnea. Negative for chest pain and leg swelling.  Gastrointestinal: Negative for heartburn, nausea, vomiting, abdominal pain, diarrhea and constipation.  Genitourinary: Negative for dysuria and hematuria.  Musculoskeletal: Negative for back pain and joint pain.  Skin: Negative for rash.  Neurological: Positive for weakness. Negative for sensory change, speech change, focal weakness and headaches.  Endo/Heme/Allergies: Does not bruise/bleed easily.  Psychiatric/Behavioral: Negative for depression. The patient is not nervous/anxious.     MEDICATIONS AT HOME:   Prior to Admission medications   Medication Sig Start Date End Date Taking? Authorizing Provider  allopurinol (ZYLOPRIM) 300 MG tablet Take 300 mg by mouth daily.    Yes Historical Provider, MD  benazepril (LOTENSIN) 20 MG tablet Take 20 mg by mouth daily.  11/12/14  Yes Historical Provider, MD  budesonide-formoterol (SYMBICORT) 160-4.5 MCG/ACT inhaler Inhale 2 puffs into the lungs 2 (two) times daily.   Yes Historical Provider, MD  HYDROcodone-acetaminophen (NORCO/VICODIN) 5-325 MG per tablet Take 1 tablet by mouth every 4 (four) hours as needed for moderate pain.    Yes Historical Provider, MD  isosorbide mononitrate (IMDUR) 30 MG 24 hr tablet Take 30 mg by mouth daily.   Yes Historical  Provider, MD  magnesium oxide (MAG-OX) 400 MG tablet Take 400 mg by mouth daily.    Yes Historical Provider, MD  metFORMIN (GLUCOPHAGE) 500 MG tablet Take 500 mg by mouth 2 (two) times daily.    Yes Historical Provider, MD  metoprolol tartrate (LOPRESSOR) 25 MG tablet Take 25 mg by mouth 2 (two) times daily.    Yes Historical Provider, MD  simvastatin (ZOCOR) 20 MG tablet Take 20 mg by mouth every evening.    Yes Historical Provider, MD  clindamycin (CLEOCIN) 150 MG capsule Take 3 capsules (450 mg total) by mouth 3 (three) times daily. Patient not taking: Reported on 04/25/2015 02/08/15   Carrie Mew, MD      VITAL SIGNS:  Blood pressure 120/103, pulse 98, temperature 97.5 F (36.4 C), temperature source Axillary, resp. rate 23, height 5\' 8"  (1.727 m), weight 82.555 kg (182 lb), SpO2 96 %.  PHYSICAL EXAMINATION:  Physical Exam  GENERAL:  75 y.o.-year-old patient lying in the bed with respiratory distress. Looks critically ill. EYES: Pupils equal, round, reactive to light and accommodation. No scleral icterus. Extraocular muscles intact.  HEENT: Head atraumatic, normocephalic. Oropharynx and nasopharynx clear. No oropharyngeal erythema, moist oral mucosa  NECK:  Supple, no jugular venous distention. No thyroid enlargement, no tenderness.  LUNGS: Basilar Breathing with poor air entry. Bilateral basal crackles. CARDIOVASCULAR: S1, S2 irregularly irregular and tachycardic. No murmurs, rubs, or gallops.  ABDOMEN: Soft, nontender, nondistended. Bowel sounds present. No organomegaly or mass.  EXTREMITIES: No cyanosis, or clubbing. + 2 pedal & radial pulses b/l.  Trace lower extremity edema NEUROLOGIC: Cranial nerves II through XII are intact. No focal Motor or sensory deficits appreciated b/l PSYCHIATRIC: The patient is alert and oriented x 3. Good affect.  SKIN: No obvious rash, lesion, or ulcer.   LABORATORY PANEL:   CBC  Recent Labs Lab 04/25/15 0001  WBC 11.6*  HGB 13.4  HCT  42.6  PLT 264   ------------------------------------------------------------------------------------------------------------------  Chemistries   Recent Labs Lab 04/25/15 0001  NA 140  K 4.5  CL 107  CO2 22  GLUCOSE 249*  BUN 28*  CREATININE 2.10*  CALCIUM 9.2  AST 35  ALT 31  ALKPHOS 131*  BILITOT 0.9   ------------------------------------------------------------------------------------------------------------------  Cardiac Enzymes  Recent Labs Lab 04/25/15 0001  TROPONINI 0.04*   ------------------------------------------------------------------------------------------------------------------  RADIOLOGY:  Dg Chest Portable 1 View  04/25/2015  CLINICAL DATA:  Shortness of breath. EXAM: PORTABLE CHEST 1 VIEW COMPARISON:  02/06/2015 FINDINGS: The heart is enlarged. Bilateral perihilar opacities and indistinct pulmonary vasculature concerning for pulmonary edema, right greater than left. No confluent airspace disease, large pleural effusion or pneumothorax. IMPRESSION: Cardiomegaly and pulmonary edema, most consistent with CHF. Electronically Signed   By: Jeb Levering M.D.   On: 04/25/2015 01:05     IMPRESSION AND PLAN:   * Acute hypoxic respiratory failure due to acute on chronic diastolic congestive heart failure - IV Lasix, Beta blockers - Input and Output - Counseled to limit  fluids and Salt - Monitor Bun/Cr and Potassium -Cardiology consult. Patient was following with Dr. Nehemiah Massed but wants to see a different cardiology group in hospital and for outpatient follow-up  * Atrial fibrillation with rapid ventricular rate Patient has received 30 mg IV Cardizem boluses and continues to have tachycardia into the 120s to 130s. We'll start him on a Cardizem drip to keep his heart rate less than 110.  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  * Diabetes mellitus Home meds along with  sliding scale insulin and diabetic diet. Hold metformin.  * Hypertension Patient has low normal blood pressure, and with renal failure hold his ACEi. We will increase metoprolol to 50 mg twice a day in the morning. Hold Imdur at this time.  * DVT prophylaxis with subcutaneous heparin   All the records are reviewed and case discussed with ED provider. Management plans discussed with the patient, family and they are in agreement.  CODE STATUS: FULL  TOTAL CRITICAL CARE TIME TAKING CARE OF THIS PATIENT: 45 minutes.    Hillary Bow R M.D on 04/25/2015 at 1:51 AM  Between 7am to 6pm - Pager - [phone]  After 6pm go to [email or web address] - password [email or web address] [facility]  Office  [phone]  CC: Primary care physician; [name], MD   Note: This dictation was prepared with Dragon dictation along with smaller phrase technology. Any transcriptional errors that result from this process are unintentional.

## 2015-04-25 NOTE — ED Notes (Signed)
Per EMS: Pt has history of CHF. Pt co of SOB, and is exhibiting bilateral rales. Pt was given 20 cardizem, 2" nitro  Paste. 18 gauge IV in left wrist. Pt BP 200/120, HR 170s before cardizem - decreased to 100s after. Pt placed on CPAP.    Pt currently c/o of SOB, with cough productive cough, with thick secretions.  Pt currently using 3-4 word sentences.

## 2015-04-25 NOTE — Progress Notes (Signed)
Mr. Villari is alert and oriented. No c/o pain. Mood pleasant and cooperative. Pt remains xin a.fib but HR is controlled in 60-70's. IV cardizem discontinued and PO cardizem started. Up to [facility] . One medium BM. Transferred to room 2577. Report called to nurse. All belongings packed and given to his son.

## 2015-04-25 NOTE — Progress Notes (Signed)
[place] at [facility]                                                                                                                                                                                            Patient Demographics   Gregory Tyler, is a 75 y.o. male, DOB - 21-Aug-1940, [id number]  Admit date - 04/24/2015   Admitting Physician Hillary Bow, MD  Outpatient Primary MD for the patient is SPARKS,JEFFREY D, MD   LOS - 0  Subjective: Patient's breathing is improved, heart rate improved denies any chest pain     Review of Systems:   CONSTITUTIONAL: No documented fever. No fatigue, weakness. No weight gain, no weight loss.  EYES: No blurry or double vision.  ENT: No tinnitus. No postnasal drip. No redness of the oropharynx.  RESPIRATORY: No cough, no wheeze, no hemoptysis. Positive dyspnea.  CARDIOVASCULAR: No chest pain. No orthopnea. No palpitations. No syncope.  GASTROINTESTINAL: No nausea, no vomiting or diarrhea. No abdominal pain. No melena or hematochezia.  GENITOURINARY: No dysuria or hematuria.  ENDOCRINE: No polyuria or nocturia. No heat or cold intolerance.  HEMATOLOGY: No anemia. No bruising. No bleeding.  INTEGUMENTARY: No rashes. No lesions.  MUSCULOSKELETAL: No arthritis. No swelling. No gout.  NEUROLOGIC: No numbness, tingling, or ataxia. No seizure-type activity.  PSYCHIATRIC: No anxiety. No insomnia. No ADD.    Vitals:   Filed Vitals:   04/25/15 0800 04/25/15 0900 04/25/15 1000 04/25/15 1120  BP: 121/78 114/96 128/75 119/61  Pulse: 81 98 76 68  Temp:    98 F (36.7 C)  TempSrc:    Oral  Resp: 14 18 20 18   Height:      Weight:      SpO2: 100% 97% 98% 96%    Wt Readings from Last 3 Encounters:  04/25/15 81.7 kg (180 lb 1.9 oz)  02/08/15 82.555 kg (182 lb)  01/13/15 82.75 kg (182 lb 6.9 oz)     Intake/Output Summary (Last 24 hours) at 04/25/15 1431 Last data filed at 04/25/15 1418  Gross per 24  hour  Intake 731.58 ml  Output   2150 ml  Net -1418.42 ml    Physical Exam:   GENERAL: Pleasant-appearing in no apparent distress.  HEAD, EYES, EARS, NOSE AND THROAT: Atraumatic, normocephalic. Extraocular muscles are intact. Pupils equal and reactive to light. Sclerae anicteric. No conjunctival injection. No oro-pharyngeal erythema.  NECK: Supple. There is no jugular venous distention. No bruits, no lymphadenopathy, no thyromegaly.  HEART: irregular irregular heart rhythm. No murmurs, no rubs, no clicks.  LUNGS: Bilateral crackles at the bases.  ABDOMEN:  Soft, flat, nontender, nondistended. Has good bowel sounds. No hepatosplenomegaly appreciated.  EXTREMITIES: No evidence of any cyanosis, clubbing, or peripheral edema.  +2 pedal and radial pulses bilaterally.  NEUROLOGIC: The patient is alert, awake, and oriented x3 with no focal motor or sensory deficits appreciated bilaterally.  SKIN: Moist and warm with no rashes appreciated.  Psych: Not anxious, depressed LN: No inguinal LN enlargement    Antibiotics   Anti-infectives    None      Medications   Scheduled Meds: . [START ON 04/26/2015] allopurinol  300 mg Oral Daily  . budesonide-formoterol  2 puff Inhalation BID  . chlorhexidine  15 mL Mouth Rinse BID  . diltiazem  120 mg Oral Daily  . furosemide  40 mg Intravenous BID  . insulin aspart  0-15 Units Subcutaneous TID WC  . insulin aspart  0-5 Units Subcutaneous QHS  . [START ON 04/26/2015] isosorbide mononitrate  30 mg Oral Daily  . magnesium oxide  400 mg Oral Daily  . metoprolol tartrate  50 mg Oral BID  . rivaroxaban  15 mg Oral Daily  . sodium chloride  3 mL Intravenous Q12H  . sodium chloride  3 mL Intravenous Q12H   Continuous Infusions:  PRN Meds:.sodium chloride, acetaminophen **OR** acetaminophen, bisacodyl, HYDROcodone-acetaminophen, ipratropium-albuterol, ondansetron **OR** ondansetron (ZOFRAN) IV, polyethylene glycol, sodium chloride, sodium  phosphate   Data Review:   Micro Results Recent Results (from the past 240 hour(s))  MRSA PCR Screening     Status: None   Collection Time: 04/25/15  2:40 AM  Result Value Ref Range Status   MRSA by PCR NEGATIVE NEGATIVE Final    Comment:        The GeneXpert MRSA Assay (FDA approved for NASAL specimens only), is one component of a comprehensive MRSA colonization surveillance program. It is not intended to diagnose MRSA infection nor to guide or monitor treatment for MRSA infections.     Radiology Reports Dg Chest Portable 1 View  04/25/2015  CLINICAL DATA:  Shortness of breath. EXAM: PORTABLE CHEST 1 VIEW COMPARISON:  02/06/2015 FINDINGS: The heart is enlarged. Bilateral perihilar opacities and indistinct pulmonary vasculature concerning for pulmonary edema, right greater than left. No confluent airspace disease, large pleural effusion or pneumothorax. IMPRESSION: Cardiomegaly and pulmonary edema, most consistent with CHF. Electronically Signed   By: Jeb Levering M.D.   On: 04/25/2015 01:05     CBC  Recent Labs Lab 04/25/15 0001 04/25/15 0310  WBC 11.6* 10.7*  HGB 13.4 12.3*  HCT 42.6 37.6*  PLT 264 215  MCV 91.5 89.6  MCH 28.9 29.4  MCHC 31.6* 32.8  RDW 16.1* 15.9*    Chemistries   Recent Labs Lab 04/25/15 0001 04/25/15 0310  NA 140 138  K 4.5 4.4  CL 107 106  CO2 22 22  GLUCOSE 249* 182*  BUN 28* 32*  CREATININE 2.10* 1.81*  CALCIUM 9.2 9.2  AST 35  --   ALT 31  --   ALKPHOS 131*  --   BILITOT 0.9  --    ------------------------------------------------------------------------------------------------------------------ estimated creatinine clearance is 34.1 mL/min (by C-G formula based on Cr of 1.81). ------------------------------------------------------------------------------------------------------------------ No results for input(s): HGBA1C in the last 72  hours. ------------------------------------------------------------------------------------------------------------------ No results for input(s): CHOL, HDL, LDLCALC, TRIG, CHOLHDL, LDLDIRECT in the last 72 hours. ------------------------------------------------------------------------------------------------------------------ No results for input(s): TSH, T4TOTAL, T3FREE, THYROIDAB in the last 72 hours.  Invalid input(s): FREET3 ------------------------------------------------------------------------------------------------------------------ No results for input(s): VITAMINB12, FOLATE, FERRITIN, TIBC, IRON, RETICCTPCT in the last 72 hours.  Coagulation profile No results for input(s): INR, PROTIME in the last 168 hours.  No results for input(s): DDIMER in the last 72 hours.  Cardiac Enzymes  Recent Labs Lab 04/25/15 0001  TROPONINI 0.04*   ------------------------------------------------------------------------------------------------------------------ Invalid input(s): POCBNP    Assessment & Plan   *Acute hypoxic respiratory failure due to acute on chronic diastolic congestive heart failure -Continue IV Lasix, Beta blockers - Input and Output - Counseled to limit fluids and Salt - Monitor Bun/Cr and Potassium -Cardiology consult. Appreciated  * Atrial fibrillation with rapid ventricular rate Patient started on long-acting diltiazem continue metoprolol  Started on Xarelto * Acute renal failure over CKD stage III - likely cardiorenal syndrome Renal function improved with IV Lasix  * Diabetes mellitus Continue sliding scale insulin and metformin on hold  * Hypertension Patient has low normal blood pressure, and with renal failure hold his ACEi. Continue metoprolol   * DVT prophylaxis with Xarelto    Code Status Orders        Start     Ordered   04/25/15 0149  Full code   Continuous     04/25/15 0149           Consults  cardiology DVT Prophylaxis   xarelto  Lab Results  Component Value Date   PLT 215 04/25/2015     Time Spent in minutes  52min  Dustin Flock M.D on 04/25/2015 at 2:31 PM  Between 7am to 6pm - Pager - [phone]  After 6pm go to [email or web address] - password [email or web address] [facility]   Office  [phone]

## 2015-04-25 NOTE — Progress Notes (Signed)
Transferred to 257

## 2015-04-25 NOTE — Progress Notes (Signed)
Called elink physician concerning cardizem drip.  Pt's hr now at 65,67, low 70's.  Per elink physician, ok to stop cardizem drip since pt is also on metoprolol.  Passed in report to [place].

## 2015-04-25 NOTE — ED Provider Notes (Signed)
[facility] Emergency Department Provider Note  ____________________________________________  Time seen: Approximately 2350 PM  I have reviewed the triage vital signs and the nursing notes.   HISTORY  Chief Complaint Respiratory Distress  History Limited by patient respiratory distress  HPI Gregory Tyler is a 75 y.o. male comes into the hospital today with shortness of breath. The patient shortness breath started today right before EMS arrived. Per EMS the patient has a history of heart failure but had some blood pressure that was 200/120 and heart rate into the 170s. The patient was in rapid A. fib. The patient's oxygen saturations are 83% and they did bring him into the hospital on BiPAP. EMS called and gave the patient a dose of Cardizem 20 mg IV 1 dose. The patient denies any chest pain at this time. He is on BiPAP and unable to answer questions well.   Past Medical History  Diagnosis Date  . Prostate cancer ([place])   . Arthritis   . Diabetes mellitus without complication ([place])   . Hypertension   . CHF (congestive heart failure) ([place])   . Emphysema lung ([place])   . Hyperlipidemia   . Gout   . Gastric ulcer   . Anemia   . Nephrolithiasis   . Coronary artery disease   . Anxiety   . Depression     Patient Active Problem List   Diagnosis Date Noted  . A-fib ([place]) 04/25/2015  . Acute on chronic diastolic (congestive) heart failure ([place]) 04/25/2015  . Acute respiratory failure with hypoxia ([place]) 04/25/2015    Past Surgical History  Procedure Laterality Date  . Cholecystectomy    . Appendectomy    . Gastric bypass    . Hemorrhoid surgery    . Back surgery    . Pilonidal cyst      No current outpatient prescriptions on file.  Allergies Codeine sulfate  No family history on file.  Social History Social History  Substance Use Topics  . Smoking status: Former Research scientist (life sciences)  . Smokeless tobacco: Current User  . Alcohol Use: No    Review of  Systems Unable to fully assess due to patient respiratory distress ____________________________________________   PHYSICAL EXAM:  VITAL SIGNS: ED Triage Vitals  Enc Vitals Group     BP 04/25/15 0000 162/92 mmHg     Pulse Rate 04/25/15 0000 106     Resp 04/25/15 0000 26     Temp 04/25/15 0019 97.5 F (36.4 C)     Temp Source 04/25/15 0019 Axillary     SpO2 04/25/15 0000 100 %     Weight 04/25/15 0019 182 lb (82.555 kg)     Height 04/25/15 0019 5\' 8"  (1.727 m)     Head Cir --      Peak Flow --      Pain Score 04/25/15 0021 0     Pain Loc --      Pain Edu? --      Excl. in [place]? --     Constitutional: Alert and oriented. Patient in severe respiratory distress Eyes: Conjunctivae are normal. PERRL. EOMI. Head: Atraumatic. Nose: No congestion/rhinnorhea. Mouth/Throat: Mucous membranes are moist.  Oropharynx non-erythematous. Cardiovascular: Tachycardic, irregular rhythm. Good peripheral circulation. Respiratory: Increased respiratory effort.  retractions. Coarse crackles throughout all lung fields Gastrointestinal: Soft and nontender. No distention. Positive bowel sounds Musculoskeletal: Lower extremity edema Neurologic: Patient's speech is normal but halting, no neurologic deficits Skin:  Skin is cool and diaphoretic  Psychiatric: Mood and affect  are normal.   ____________________________________________   LABS (all labs ordered are listed, but only abnormal results are displayed)  Labs Reviewed  CBC - Abnormal; Notable for the following:    WBC 11.6 (*)    MCHC 31.6 (*)    RDW 16.1 (*)    All other components within normal limits  COMPREHENSIVE METABOLIC PANEL - Abnormal; Notable for the following:    Glucose, Bld 249 (*)    BUN 28 (*)    Creatinine, Ser 2.10 (*)    Alkaline Phosphatase 131 (*)    GFR calc non Af Amer 29 (*)    GFR calc Af Amer 34 (*)    All other components within normal limits  TROPONIN I - Abnormal; Notable for the following:    Troponin I  0.04 (*)    All other components within normal limits  BRAIN NATRIURETIC PEPTIDE - Abnormal; Notable for the following:    B Natriuretic Peptide 765.0 (*)    All other components within normal limits  GLUCOSE, CAPILLARY - Abnormal; Notable for the following:    Glucose-Capillary 167 (*)    All other components within normal limits  MRSA PCR SCREENING  BASIC METABOLIC PANEL  CBC  URINALYSIS COMPLETEWITH MICROSCOPIC ([facility] ONLY)   ____________________________________________  EKG  ED ECG REPORT I, Loney Hering, the attending physician, personally viewed and interpreted this ECG.   Date: 04/24/2015  EKG Time: 2355  Rate: 114  Rhythm: normal EKG, normal sinus rhythm, unchanged from previous tracings, atrial fibrillation, rate 114, LBBB  Axis: normal  Intervals:left bundle branch block  ST&T Change: none  ____________________________________________  RADIOLOGY  Chest x-ray: Cardiomegaly and pulmonary edema, most consistent with CHF ____________________________________________   PROCEDURES  Procedure(s) performed: None  Critical Care performed: Yes, see critical care note(s)  CRITICAL CARE Performed by: Charlesetta Ivory P   Total critical care time: 45 minutes  Critical care time was exclusive of separately billable procedures and treating other patients.  Critical care was necessary to treat or prevent imminent or life-threatening deterioration.  Critical care was time spent personally by me on the following activities: development of treatment plan with patient and/or surrogate as well as nursing, discussions with consultants, evaluation of patient's response to treatment, examination of patient, obtaining history from patient or surrogate, ordering and performing treatments and interventions, ordering and review of laboratory studies, ordering and review of radiographic studies, pulse oximetry and re-evaluation of patient's condition.   ____________________________________________   INITIAL IMPRESSION / ASSESSMENT AND PLAN / ED COURSE  Pertinent labs & imaging results that were available during my care of the patient were reviewed by me and considered in my medical decision making (see chart for details).  This is a 75 year old male who comes into the hospital today with acute respiratory distress, hypoxia, A. fib with rapid ventricular rate and congestive heart failure. When the patient arrived his oxygen saturations were 87% on EMS CPAP. The patient was placed on our BiPAP. The patient's heart rate was down into the 110s but his blood pressure was still high into the 170s. The patient did receive Lasix 40 mg IV 1 dose when he initially arrived. We continued to monitor the patient but his breathing improved on the BiPAP. We attempted to give the patient a second dose of diltiazem but after 5 mg of patient's blood pressure dropped into the 80s. The decision was made to hold a bolus of the diltiazem and admit him to the hospital for a diltiazem  drip. The patient also had some increase in his heart rate with standing and we did remove the Nitropaste as his blood pressure did drop into the 100s. The patient will be admitted to the hospital into the intensive care unit for further evaluation of his atrial fibrillation and his CHF exacerbation. ____________________________________________   FINAL CLINICAL IMPRESSION(S) / ED DIAGNOSES  Final diagnoses:  Acute on chronic congestive heart failure, unspecified congestive heart failure type (HCC)  Dyspnea  Atrial fibrillation with rapid ventricular response ([place])  Acute pulmonary edema (HCC)      Loney Hering, MD 04/25/15 [phone]

## 2015-04-25 NOTE — ED Notes (Signed)
Dr. Dahlia Client notified of critical lab value - Troponin 0.04, acknowledged, no new orders.

## 2015-04-25 NOTE — ED Notes (Signed)
MD ordered removal of last 1" of nitro paste. Nitro paste removed

## 2015-04-25 NOTE — ED Notes (Signed)
Pt's wife would like to be contacted for any changes in pt status. [place] [phone]

## 2015-04-25 NOTE — Progress Notes (Signed)
*  PRELIMINARY RESULTS* Echocardiogram 2D Echocardiogram has been performed.  Gregory Tyler 04/25/2015, 11:56 AM

## 2015-04-26 DIAGNOSIS — I482 Chronic atrial fibrillation: Secondary | ICD-10-CM

## 2015-04-26 LAB — CBC
HCT: 37.9 % — ABNORMAL LOW (ref 40.0–52.0)
HEMOGLOBIN: 12.4 g/dL — AB (ref 13.0–18.0)
MCH: 28.3 pg (ref 26.0–34.0)
MCHC: 32.7 g/dL (ref 32.0–36.0)
MCV: 86.7 fL (ref 80.0–100.0)
PLATELETS: 216 10*3/uL (ref 150–440)
RBC: 4.37 MIL/uL — AB (ref 4.40–5.90)
RDW: 15.6 % — ABNORMAL HIGH (ref 11.5–14.5)
WBC: 7.5 10*3/uL (ref 3.8–10.6)

## 2015-04-26 LAB — BASIC METABOLIC PANEL
ANION GAP: 7 (ref 5–15)
BUN: 35 mg/dL — ABNORMAL HIGH (ref 6–20)
CALCIUM: 9.4 mg/dL (ref 8.9–10.3)
CHLORIDE: 103 mmol/L (ref 101–111)
CO2: 28 mmol/L (ref 22–32)
CREATININE: 1.5 mg/dL — AB (ref 0.61–1.24)
GFR calc non Af Amer: 44 mL/min — ABNORMAL LOW (ref >60)
GFR, EST AFRICAN AMERICAN: 51 mL/min — AB (ref >60)
Glucose, Bld: 139 mg/dL — ABNORMAL HIGH (ref 65–99)
Potassium: 3.8 mmol/L (ref 3.5–5.1)
SODIUM: 138 mmol/L (ref 135–145)

## 2015-04-26 LAB — GLUCOSE, CAPILLARY
GLUCOSE-CAPILLARY: 202 mg/dL — AB (ref 65–99)
Glucose-Capillary: 120 mg/dL — ABNORMAL HIGH (ref 65–99)
Glucose-Capillary: 123 mg/dL — ABNORMAL HIGH (ref 65–99)
Glucose-Capillary: 191 mg/dL — ABNORMAL HIGH (ref 65–99)

## 2015-04-26 LAB — TROPONIN I: TROPONIN I: 0.06 ng/mL — AB (ref <0.031)

## 2015-04-26 MED ORDER — ROSUVASTATIN CALCIUM 20 MG PO TABS
20.0000 mg | ORAL_TABLET | Freq: Every day | ORAL | Status: DC
  Administered 2015-04-26: 20 mg via ORAL
  Filled 2015-04-26: qty 1

## 2015-04-26 MED ORDER — ATORVASTATIN CALCIUM 20 MG PO TABS: 40.0000 mg | ORAL_TABLET | Freq: Every day | ORAL | Status: DC

## 2015-04-26 NOTE — Progress Notes (Signed)
Initial appointment made at the [facility] on May 19, 2015 at 9:00am. Thank you

## 2015-04-26 NOTE — Progress Notes (Signed)
DR Rockey Situ and DR Posey Pronto where informed of pt's trop. Level of 0.06, no new order , continue to monitor

## 2015-04-26 NOTE — Progress Notes (Signed)
Patient: Gregory Tyler / Admit Date: 04/24/2015 / Date of Encounter: 04/26/2015, 10:33 AM   Subjective: No complaints. Wants to go home. Minus 5600 mL for the admission.  "Lots on me at home", stress  Review of Systems: Review of Systems  Constitutional: Positive for malaise/fatigue.  Respiratory: Negative.   Cardiovascular: Negative.   Gastrointestinal: Negative.   Musculoskeletal: Negative.   Neurological: Negative.   Psychiatric/Behavioral: Negative.   All other systems reviewed and are negative.   Objective: Telemetry: Afib, 13 beats of NSVT Physical Exam: Blood pressure 140/81, pulse 72, temperature 98.1 F (36.7 C), temperature source Oral, resp. rate 18, height 5\' 8"  (1.727 m), weight 169 lb (76.658 kg), SpO2 96 %. Body mass index is 25.7 kg/(m^2). General: Well developed, well nourished, in no acute distress. Head: Normocephalic, atraumatic, sclera non-icteric, no xanthomas, nares are without discharge. Neck: Negative for carotid bruits. JVP not elevated. Lungs: Clear bilaterally to auscultation without wheezes, rales, or rhonchi. Breathing is unlabored. Heart: Irregularly, irregular, S1 S2 without murmurs, rubs, or gallops.  Abdomen: Soft, non-tender, non-distended with normoactive bowel sounds. No rebound/guarding. Extremities: No clubbing or cyanosis. No edema. Distal pedal pulses are 2+ and equal bilaterally. Neuro: Alert and oriented X 3. Moves all extremities spontaneously. Psych:  Responds to questions appropriately with a normal affect.   Intake/Output Summary (Last 24 hours) at 04/26/15 1033 Last data filed at 04/26/15 1001  Gross per 24 hour  Intake    720 ml  Output   4700 ml  Net  -3980 ml    Inpatient Medications:  . allopurinol  300 mg Oral Daily  . budesonide-formoterol  2 puff Inhalation BID  . diltiazem  120 mg Oral Daily  . furosemide  40 mg Intravenous BID  . insulin aspart  0-15 Units Subcutaneous TID WC  . insulin aspart  0-5 Units  Subcutaneous QHS  . isosorbide mononitrate  30 mg Oral Daily  . magnesium oxide  400 mg Oral Daily  . metoprolol tartrate  50 mg Oral BID  . rivaroxaban  15 mg Oral Daily   Infusions:    Labs:  Recent Labs  04/25/15 0310 04/26/15 0529  NA 138 138  K 4.4 3.8  CL 106 103  CO2 22 28  GLUCOSE 182* 139*  BUN 32* 35*  CREATININE 1.81* 1.50*  CALCIUM 9.2 9.4    Recent Labs  04/25/15 0001  AST 35  ALT 31  ALKPHOS 131*  BILITOT 0.9  PROT 8.1  ALBUMIN 4.4    Recent Labs  04/25/15 0310 04/26/15 0529  WBC 10.7* 7.5  HGB 12.3* 12.4*  HCT 37.6* 37.9*  MCV 89.6 86.7  PLT 215 216    Recent Labs  04/25/15 0001  TROPONINI 0.04*   Invalid input(s): POCBNP No results for input(s): HGBA1C in the last 72 hours.   Weights: Filed Weights   04/25/15 0019 04/25/15 0251 04/26/15 0549  Weight: 182 lb (82.555 kg) 180 lb 1.9 oz (81.7 kg) 169 lb (76.658 kg)     Radiology/Studies:  Dg Chest Portable 1 View  04/25/2015  CLINICAL DATA:  Shortness of breath. EXAM: PORTABLE CHEST 1 VIEW COMPARISON:  02/06/2015 FINDINGS: The heart is enlarged. Bilateral perihilar opacities and indistinct pulmonary vasculature concerning for pulmonary edema, right greater than left. No confluent airspace disease, large pleural effusion or pneumothorax. IMPRESSION: Cardiomegaly and pulmonary edema, most consistent with CHF. Electronically Signed   By: Jeb Levering M.D.   On: 04/25/2015 01:05     Assessment and  Plan   1. Chronic Afib: -Rate controlled -Improved with addition of Cardizem CD 120 mg daily, continue at this time. Can address at outpatient follow up  Lopressor  50 mg bid given ventricular ectopy overnight -Xarelto 15 mg q supper (CrCl 46) -CHADS2VASc at least 5 (CHF, HTN, DM, age x 2) -Risks of bleeding discussed  2. Acute respiratory distress: -Off O2 currently -Likely in the setting of acute on chronic combined CHF 2/2 Afib with RVR -Improved  3. Acute on chronic combined  CHF: -Improved -Lasix 20 mg daily with KCl repletion  4. Acute renal injury: -Improving -Will need close follow up and recheck bmet with PCP in 48 hours  5. CAD/cardiomyopathy: -Note from prior cardiologist on 03/08/2014 documented cardiac cath in 2015 with  CAD with no critical stenosis needing intervention  -?EF improved from 2015 -Medical management as above -Change simvastatin to Lipitor given patient on diltiazem -Continue Imdur  6. Polypharmacy: -Patient would likely benefit from home health aid s/p d/c to help with medication management as he currently has a lot on his plate. D/w with Dr. Rockey Situ   Signed, Christell Faith, PA-C Pager: [phone] 04/26/2015, 10:33 AM   Attending Note Patient seen and examined, agree with detailed note above,  Patient presentation and plan discussed on rounds.   Patient reports he feels well is morning, denies any significant shortness of breath or palpitations Feels he might be ready to go on Somewhat tearful when talking about his stressors at home, "lots of stress" on him, concerning taking care of his wife.  Started on diltiazem, anticoagulation with Xarelto, 15 mg daily We'll need to follow closely as an outpatient Metoprolol increased up to 50 mg twice a day given short run of nonsustained VT  Continues to have depressed ejection fraction 30-35%, We'll try to obtain his prior cardiac catheterization report for our records, last performed 2015  Would likely benefit from Lasix 20 mg daily with potassium 10 given depressed ejection fraction, underlying arrhythmia, high risk of recurrent acute on chronic diastolic/systolic CHF  May need visiting nurses at home to help with polypharmacy, CHF management   Signed: Esmond Plants  M.D., Ph.D. [facility]

## 2015-04-26 NOTE — Care Management (Signed)
Provided patient with Xarelto coupon for 30 free.  His insurance carrier for medications is [employer].  Will check with agency to determine copay for subsequent scripts.  Patient may benefit from home health nurse follow up to assure proper management of disease state.  Patient is primary caregiver for his wife who has parkinson and it sounds as if he may put her needs over his own

## 2015-04-26 NOTE — Progress Notes (Signed)
Normal at [facility]                                                                                                                                                                                            Patient Demographics   Gregory Tyler, is a 75 y.o. male, DOB - Sep 03, 1940, [id number]  Admit date - 04/24/2015   Admitting Physician Hillary Bow, MD  Outpatient Primary MD for the patient is SPARKS,JEFFREY D, MD   LOS - 1  Subjective: Patient's breathing is improved, heart rate improved denies any chest pain     Review of Systems:   CONSTITUTIONAL: No documented fever. No fatigue, weakness. No weight gain, no weight loss.  EYES: No blurry or double vision.  ENT: No tinnitus. No postnasal drip. No redness of the oropharynx.  RESPIRATORY: No cough, no wheeze, no hemoptysis. Positive dyspnea.  CARDIOVASCULAR: No chest pain. No orthopnea. No palpitations. No syncope.  GASTROINTESTINAL: No nausea, no vomiting or diarrhea. No abdominal pain. No melena or hematochezia.  GENITOURINARY: No dysuria or hematuria.  ENDOCRINE: No polyuria or nocturia. No heat or cold intolerance.  HEMATOLOGY: No anemia. No bruising. No bleeding.  INTEGUMENTARY: No rashes. No lesions.  MUSCULOSKELETAL: No arthritis. No swelling. No gout.  NEUROLOGIC: No numbness, tingling, or ataxia. No seizure-type activity.  PSYCHIATRIC: No anxiety. No insomnia. No ADD.    Vitals:   Filed Vitals:   04/25/15 2054 04/26/15 0549 04/26/15 0753 04/26/15 1101  BP: 131/77 137/82 140/81 124/84  Pulse: 79 85 72 87  Temp: 98.5 F (36.9 C) 97.8 F (36.6 C) 98.1 F (36.7 C) 97.9 F (36.6 C)  TempSrc: Oral Oral Oral   Resp: 16 16 18 20   Height:      Weight:  76.658 kg (169 lb)    SpO2: 96% 95% 96% 97%    Wt Readings from Last 3 Encounters:  04/26/15 76.658 kg (169 lb)  02/08/15 82.555 kg (182 lb)  01/13/15 82.75 kg (182 lb 6.9 oz)     Intake/Output Summary (Last 24  hours) at 04/26/15 1509 Last data filed at 04/26/15 1236  Gross per 24 hour  Intake    480 ml  Output   5250 ml  Net  -4770 ml    Physical Exam:   GENERAL: Pleasant-appearing in no apparent distress.  HEAD, EYES, EARS, NOSE AND THROAT: Atraumatic, normocephalic. Extraocular muscles are intact. Pupils equal and reactive to light. Sclerae anicteric. No conjunctival injection. No oro-pharyngeal erythema.  NECK: Supple. There is no jugular venous distention. No bruits, no lymphadenopathy, no thyromegaly.  HEART: irregular irregular heart rhythm. No  murmurs, no rubs, no clicks.  LUNGS: Bilateral crackles at the bases.  ABDOMEN: Soft, flat, nontender, nondistended. Has good bowel sounds. No hepatosplenomegaly appreciated.  EXTREMITIES: No evidence of any cyanosis, clubbing, or peripheral edema.  +2 pedal and radial pulses bilaterally.  NEUROLOGIC: The patient is alert, awake, and oriented x3 with no focal motor or sensory deficits appreciated bilaterally.  SKIN: Moist and warm with no rashes appreciated.  Psych: Not anxious, depressed LN: No inguinal LN enlargement    Antibiotics   Anti-infectives    None      Medications   Scheduled Meds: . allopurinol  300 mg Oral Daily  . atorvastatin  40 mg Oral q1800  . budesonide-formoterol  2 puff Inhalation BID  . diltiazem  120 mg Oral Daily  . furosemide  40 mg Intravenous BID  . insulin aspart  0-15 Units Subcutaneous TID WC  . insulin aspart  0-5 Units Subcutaneous QHS  . isosorbide mononitrate  30 mg Oral Daily  . magnesium oxide  400 mg Oral Daily  . metoprolol tartrate  50 mg Oral BID  . rivaroxaban  15 mg Oral Daily   Continuous Infusions:  PRN Meds:.sodium chloride, acetaminophen **OR** acetaminophen, bisacodyl, HYDROcodone-acetaminophen, ipratropium-albuterol, ondansetron **OR** ondansetron (ZOFRAN) IV, polyethylene glycol, sodium chloride, sodium phosphate   Data Review:   Micro Results Recent Results (from the past  240 hour(s))  MRSA PCR Screening     Status: None   Collection Time: 04/25/15  2:40 AM  Result Value Ref Range Status   MRSA by PCR NEGATIVE NEGATIVE Final    Comment:        The GeneXpert MRSA Assay (FDA approved for NASAL specimens only), is one component of a comprehensive MRSA colonization surveillance program. It is not intended to diagnose MRSA infection nor to guide or monitor treatment for MRSA infections.     Radiology Reports Dg Chest Portable 1 View  04/25/2015  CLINICAL DATA:  Shortness of breath. EXAM: PORTABLE CHEST 1 VIEW COMPARISON:  02/06/2015 FINDINGS: The heart is enlarged. Bilateral perihilar opacities and indistinct pulmonary vasculature concerning for pulmonary edema, right greater than left. No confluent airspace disease, large pleural effusion or pneumothorax. IMPRESSION: Cardiomegaly and pulmonary edema, most consistent with CHF. Electronically Signed   By: Jeb Levering M.D.   On: 04/25/2015 01:05     CBC  Recent Labs Lab 04/25/15 0001 04/25/15 0310 04/26/15 0529  WBC 11.6* 10.7* 7.5  HGB 13.4 12.3* 12.4*  HCT 42.6 37.6* 37.9*  PLT 264 215 216  MCV 91.5 89.6 86.7  MCH 28.9 29.4 28.3  MCHC 31.6* 32.8 32.7  RDW 16.1* 15.9* 15.6*    Chemistries   Recent Labs Lab 04/25/15 0001 04/25/15 0310 04/26/15 0529  NA 140 138 138  K 4.5 4.4 3.8  CL 107 106 103  CO2 22 22 28   GLUCOSE 249* 182* 139*  BUN 28* 32* 35*  CREATININE 2.10* 1.81* 1.50*  CALCIUM 9.2 9.2 9.4  AST 35  --   --   ALT 31  --   --   ALKPHOS 131*  --   --   BILITOT 0.9  --   --    ------------------------------------------------------------------------------------------------------------------ estimated creatinine clearance is 41.2 mL/min (by C-G formula based on Cr of 1.5). ------------------------------------------------------------------------------------------------------------------ No results for input(s): HGBA1C in the last 72  hours. ------------------------------------------------------------------------------------------------------------------ No results for input(s): CHOL, HDL, LDLCALC, TRIG, CHOLHDL, LDLDIRECT in the last 72 hours. ------------------------------------------------------------------------------------------------------------------ No results for input(s): TSH, T4TOTAL, T3FREE, THYROIDAB in the last 72  hours.  Invalid input(s): FREET3 ------------------------------------------------------------------------------------------------------------------ No results for input(s): VITAMINB12, FOLATE, FERRITIN, TIBC, IRON, RETICCTPCT in the last 72 hours.  Coagulation profile No results for input(s): INR, PROTIME in the last 168 hours.  No results for input(s): DDIMER in the last 72 hours.  Cardiac Enzymes  Recent Labs Lab 04/25/15 0001 04/26/15 0529  TROPONINI 0.04* 0.06*   ------------------------------------------------------------------------------------------------------------------ Invalid input(s): POCBNP    Assessment & Plan   *Acute hypoxic respiratory failure due to acute on chronic diastolic congestive heart failure -Continue IV Lasix, Beta blockers - Input and Output - Counseled to limit fluids and Salt - Monitor Bun/Cr and Potassium -Cardiology consult. Appreciated  * Atrial fibrillation with rapid ventricular rate Patient started on long-acting diltiazem continue metoprolol  Started on Xarelto  Oral cardizem -- hr stable  * Acute renal failure over CKD stage III - likely cardiorenal syndrome Renal function improved with IV Lasix, continue to monitor  * Diabetes mellitus Continue sliding scale insulin and metformin on hold  * Hypertension Continue metoprolol, imdure   * DVT prophylaxis with Xarelto    Code Status Orders        Start     Ordered   04/25/15 0149  Full code   Continuous     04/25/15 0149           Consults  cardiology DVT Prophylaxis   xarelto  Lab Results  Component Value Date   PLT 216 04/26/2015     Time Spent in minutes  23min  Sahira Cataldi M.D on 04/26/2015 at 3:09 PM  Between 7am to 6pm - Pager - [phone]  After 6pm go to [email or web address] - password [email or web address] [place]   Office  [phone]

## 2015-04-26 NOTE — Discharge Instructions (Signed)
Heart Failure Clinic appointment on May 19, 2015 at 9:00am with Darylene Price, [place]. Please call [phone] to reschedule.

## 2015-04-27 ENCOUNTER — Telehealth: Payer: Self-pay

## 2015-04-27 ENCOUNTER — Encounter: Payer: Self-pay | Admitting: Student

## 2015-04-27 DIAGNOSIS — Z79899 Other long term (current) drug therapy: Secondary | ICD-10-CM

## 2015-04-27 LAB — CBC
HEMATOCRIT: 43.4 % (ref 40.0–52.0)
HEMOGLOBIN: 14.2 g/dL (ref 13.0–18.0)
MCH: 28.3 pg (ref 26.0–34.0)
MCHC: 32.7 g/dL (ref 32.0–36.0)
MCV: 86.5 fL (ref 80.0–100.0)
Platelets: 253 10*3/uL (ref 150–440)
RBC: 5.02 MIL/uL (ref 4.40–5.90)
RDW: 16 % — ABNORMAL HIGH (ref 11.5–14.5)
WBC: 8.6 10*3/uL (ref 3.8–10.6)

## 2015-04-27 LAB — BASIC METABOLIC PANEL
ANION GAP: 8 (ref 5–15)
BUN: 39 mg/dL — ABNORMAL HIGH (ref 6–20)
CHLORIDE: 98 mmol/L — AB (ref 101–111)
CO2: 32 mmol/L (ref 22–32)
CREATININE: 1.41 mg/dL — AB (ref 0.61–1.24)
Calcium: 9.9 mg/dL (ref 8.9–10.3)
GFR calc non Af Amer: 47 mL/min — ABNORMAL LOW (ref >60)
GFR, EST AFRICAN AMERICAN: 55 mL/min — AB (ref >60)
Glucose, Bld: 161 mg/dL — ABNORMAL HIGH (ref 65–99)
Potassium: 4.9 mmol/L (ref 3.5–5.1)
Sodium: 138 mmol/L (ref 135–145)

## 2015-04-27 LAB — GLUCOSE, CAPILLARY: Glucose-Capillary: 157 mg/dL — ABNORMAL HIGH (ref 65–99)

## 2015-04-27 MED ORDER — FUROSEMIDE 40 MG PO TABS: 40.0000 mg | ORAL_TABLET | Freq: Every day | ORAL | Status: DC

## 2015-04-27 MED ORDER — DILTIAZEM HCL ER COATED BEADS 120 MG PO CP24
120.0000 mg | ORAL_CAPSULE | Freq: Every day | ORAL | Status: DC
Start: 2015-04-27 — End: 2015-08-22

## 2015-04-27 MED ORDER — RIVAROXABAN 15 MG PO TABS: 15.0000 mg | ORAL_TABLET | Freq: Every day | ORAL | Status: DC

## 2015-04-27 MED ORDER — ROSUVASTATIN CALCIUM 20 MG PO TABS: 20.0000 mg | ORAL_TABLET | Freq: Every day | ORAL | Status: DC

## 2015-04-27 NOTE — Telephone Encounter (Signed)
Patient contacted regarding discharge from [facility] on 04/27/15.  Patient understands to follow up with [facility] on 05/09/15 at 9:20 at [facility]. Patient understands discharge instructions? yes Patient understands medications and regiment? yes Patient understands to bring all medications to this visit? yes

## 2015-04-27 NOTE — Progress Notes (Signed)
Pt ambulated around nursing station with Suzzanne Cloud. No complaints or distress. Heart rate stable per telemetry clerk. Will work on discharge per Dr. Edward Jolly orders.

## 2015-04-27 NOTE — Care Management Important Message (Signed)
Important Message  Patient Details  Name: Gregory Tyler MRN: [id number] Date of Birth: 1940/10/03   Medicare Important Message Given:  Yes    [name] 04/27/2015, 9:08 AM

## 2015-04-27 NOTE — Telephone Encounter (Signed)
-----   Message from Blain Pais sent at 04/27/2015  1:44 PM EST ----- Regarding: tcm/ph 05/09/2015 Dr. Rockey Situ 9:20 am

## 2015-04-27 NOTE — Care Management (Signed)
Patient is for discharge today and is agreeable to home health nursing to assist with medications administration and response to med changes.  Agency preference is [facility].  Requested home visit within 24 hours of admission.  Patient does have the Xarelto coupon give yesterday and informed copay would be 45 dollars.  Daughter Sharla Kidney is present.  She resides in [place]  [phone].  Referral called to [facility] with [facility]

## 2015-04-27 NOTE — Discharge Summary (Signed)
[facility] [place] NAME: Gregory Tyler    MR#:  [id number]  DATE OF BIRTH:  November 22, 1940  DATE OF ADMISSION:  04/24/2015 ADMITTING PHYSICIAN: Hillary Bow, MD  DATE OF DISCHARGE: 04/27/2015  2:40 PM  PRIMARY CARE PHYSICIAN: SPARKS,JEFFREY D, MD    ADMISSION DIAGNOSIS:  Dyspnea [R06.00] Acute on chronic congestive heart failure, unspecified congestive heart failure type ([place]) [I50.9]  DISCHARGE DIAGNOSIS:  Active Problems:   Chronic atrial fibrillation (HCC)   Acute on chronic diastolic (congestive) heart failure (HCC)   Acute respiratory failure with hypoxia (HCC)   Persistent atrial fibrillation with rapid ventricular response (HCC)   Acute on chronic combined systolic and diastolic CHF (congestive heart failure) (HCC)   Acute pulmonary edema (HCC)   Dyspnea   Coronary artery disease involving native coronary artery of native heart without angina pectoris   Essential hypertension   Polypharmacy   SECONDARY DIAGNOSIS:   Past Medical History  Diagnosis Date  . Prostate cancer ([place])   . Arthritis   . Diabetes mellitus without complication ([place])   . Hypertension   . CHF (congestive heart failure) (HCC)     Combined Systolic and Diastolic; EF 123456 in [id number]  . Emphysema lung ([place])   . Hyperlipidemia   . Gout   . Gastric ulcer   . Anemia   . Nephrolithiasis   . Coronary artery disease   . Anxiety   . Depression   . Chronic atrial fibrillation (HCC)     a. on Xarelto    HOSPITAL COURSE:   75 year old male with past medical history of prostate cancer, diabetes, hypertension, history of CHF, COPD, gout, chronic anemia who presented to the hospital due to shortness of breath and noted to be in atrial fibrillation with rapid ventricular response and also noted to be in mild CHF.  #1 atrial fibrillation with rapid ventricular response-patient presented to the hospital with heart rate significantly elevated but he has  a previous history of atrial fibrillation. -Initially patient was started on a Cardizem drip and then weaned off of it as his heart rate improved. -He is currently being discharged on oral Cardizem and metoprolol for rate control and also started on long term and a coagulation with Xarelto. Patient was seen by cardiology by Dr. Stevphen Meuse and will cont. Follow up with them as outpatient.   #2 CHF-acute on chronic systolic dysfunction. Patient's recent echocardiogram showed EF of 35-40%. -Patient was diuresed with IV Lasix and responded well to it is about 8.5 L negative since admission. -He is clinically doing well therefore being discharged on oral Lasix, beta blocker. Due to some relative hypotension and mild renal failure he is not being discharged on ACE inhibitor but this can be addressed as an outpatient.  #3 acute renal failure-baseline creatinine of 1.3 and it trended up in the hospital as high as 2.1. -On the day of discharge creatinine is 1.4 and this is probably related to underlying CHF. -Patient's renal function needs to be followed in the next week or so.  #4 COPD-patient had no acute exacerbation. He will continue Symbicort.  #5 hyperlipidemia-patient was switched from simvastatin to Crestor as he was started on Cardizem  #6 history of gout-no acute attack. Patient will continue his allopurinol.  #7 diabetes type 2-patient's metformin was held while in the hospital due to renal failure but is being resumed upon discharge. Patient likely needs to be switched off metformin to something else if his renal  function continues to get worse but this is to be further addressed through his primary care physician.  Patient is being discharged home with home health nursing services.  DISCHARGE CONDITIONS:   Stable  CONSULTS OBTAINED:  Treatment Team:  Minna Merritts, MD  DRUG ALLERGIES:   Allergies  Allergen Reactions  . Codeine Sulfate     Other reaction(s): Other (See  Comments) BLISTERS    DISCHARGE MEDICATIONS:   Discharge Medication List as of 04/27/2015  2:10 PM    START taking these medications   Details  diltiazem (CARDIZEM CD) 120 MG 24 hr capsule Take 1 capsule (120 mg total) by mouth daily., Starting 04/27/2015, Until Discontinued, Print    furosemide (LASIX) 40 MG tablet Take 1 tablet (40 mg total) by mouth daily., Starting 04/28/2015, Until Discontinued, Print    Rivaroxaban (XARELTO) 15 MG TABS tablet Take 1 tablet (15 mg total) by mouth daily., Starting 04/27/2015, Until Discontinued, Print    rosuvastatin (CRESTOR) 20 MG tablet Take 1 tablet (20 mg total) by mouth daily at 6 PM., Starting 04/27/2015, Until Discontinued, Print      CONTINUE these medications which have NOT CHANGED   Details  allopurinol (ZYLOPRIM) 300 MG tablet Take 300 mg by mouth daily. , Until Discontinued, Historical Med    budesonide-formoterol (SYMBICORT) 160-4.5 MCG/ACT inhaler Inhale 2 puffs into the lungs 2 (two) times daily., Until Discontinued, Historical Med    HYDROcodone-acetaminophen (NORCO/VICODIN) 5-325 MG per tablet Take 1 tablet by mouth every 4 (four) hours as needed for moderate pain. , Until Discontinued, Historical Med    isosorbide mononitrate (IMDUR) 30 MG 24 hr tablet Take 30 mg by mouth daily., Until Discontinued, Historical Med    magnesium oxide (MAG-OX) 400 MG tablet Take 400 mg by mouth daily. , Until Discontinued, Historical Med    metFORMIN (GLUCOPHAGE) 500 MG tablet Take 500 mg by mouth 2 (two) times daily. , Until Discontinued, Historical Med    metoprolol tartrate (LOPRESSOR) 25 MG tablet Take 25 mg by mouth 2 (two) times daily. , Until Discontinued, Historical Med      STOP taking these medications     benazepril (LOTENSIN) 20 MG tablet      simvastatin (ZOCOR) 20 MG tablet      clindamycin (CLEOCIN) 150 MG capsule          DISCHARGE INSTRUCTIONS:   DIET:  Cardiac diet and Diabetic diet  DISCHARGE CONDITION:   Stable  ACTIVITY:  Activity as tolerated  OXYGEN:  Home Oxygen: No.   Oxygen Delivery: room air  DISCHARGE LOCATION:  Home with home health nursing   If you experience worsening of your admission symptoms, develop shortness of breath, life threatening emergency, suicidal or homicidal thoughts you must seek medical attention immediately by calling 911 or calling your MD immediately  if symptoms less severe.  You Must read complete instructions/literature along with all the possible adverse reactions/side effects for all the Medicines you take and that have been prescribed to you. Take any new Medicines after you have completely understood and accpet all the possible adverse reactions/side effects.   Please note  You were cared for by a hospitalist during your hospital stay. If you have any questions about your discharge medications or the care you received while you were in the hospital after you are discharged, you can call the unit and asked to speak with the hospitalist on call if the hospitalist that took care of you is not available. Once you are discharged,  your primary care physician will handle any further medical issues. Please note that NO REFILLS for any discharge medications will be authorized once you are discharged, as it is imperative that you return to your primary care physician (or establish a relationship with a primary care physician if you do not have one) for your aftercare needs so that they can reassess your need for medications and monitor your lab values.     Today   Shortness of breath much improved. Heart rate stable. No complaints presently. Family at bedside.  VITAL SIGNS:  Blood pressure 106/70, pulse 80, temperature 97.5 F (36.4 C), temperature source Oral, resp. rate 18, height 5\' 8"  (1.727 m), weight 74.163 kg (163 lb 8 oz), SpO2 95 %.  I/O:   Intake/Output Summary (Last 24 hours) at 04/27/15 1649 Last data filed at 04/27/15 1329  Gross per 24  hour  Intake    480 ml  Output   2325 ml  Net  -1845 ml    PHYSICAL EXAMINATION:  GENERAL:  75 y.o.-year-old patient lying in the bed with no acute distress.  EYES: Pupils equal, round, reactive to light and accommodation. No scleral icterus. Extraocular muscles intact.  HEENT: Head atraumatic, normocephalic. Oropharynx and nasopharynx clear.  NECK:  Supple, no jugular venous distention. No thyroid enlargement, no tenderness.  LUNGS: Normal breath sounds bilaterally, no wheezing, rales,rhonchi. No use of accessory muscles of respiration.  CARDIOVASCULAR: S1, S2 normal. No murmurs, rubs, or gallops.  ABDOMEN: Soft, non-tender, non-distended. Bowel sounds present. No organomegaly or mass.  EXTREMITIES: No pedal edema, cyanosis, or clubbing.  NEUROLOGIC: Cranial nerves II through XII are intact. No focal motor or sensory defecits b/l.  PSYCHIATRIC: The patient is alert and oriented x 3. Good affect.  SKIN: No obvious rash, lesion, or ulcer.   DATA REVIEW:   CBC  Recent Labs Lab 04/27/15 0505  WBC 8.6  HGB 14.2  HCT 43.4  PLT 253    Chemistries   Recent Labs Lab 04/25/15 0001  04/27/15 0505  NA 140  < > 138  K 4.5  < > 4.9  CL 107  < > 98*  CO2 22  < > 32  GLUCOSE 249*  < > 161*  BUN 28*  < > 39*  CREATININE 2.10*  < > 1.41*  CALCIUM 9.2  < > 9.9  AST 35  --   --   ALT 31  --   --   ALKPHOS 131*  --   --   BILITOT 0.9  --   --   < > = values in this interval not displayed.  Cardiac Enzymes  Recent Labs Lab 04/26/15 0529  TROPONINI 0.06*    Microbiology Results  Results for orders placed or performed during the hospital encounter of 04/24/15  MRSA PCR Screening     Status: None   Collection Time: 04/25/15  2:40 AM  Result Value Ref Range Status   MRSA by PCR NEGATIVE NEGATIVE Final    Comment:        The GeneXpert MRSA Assay (FDA approved for NASAL specimens only), is one component of a comprehensive MRSA colonization surveillance program. It is  not intended to diagnose MRSA infection nor to guide or monitor treatment for MRSA infections.     RADIOLOGY:  No results found.    Management plans discussed with the patient, family and they are in agreement.  CODE STATUS:     Code Status Orders  Start     Ordered   04/25/15 0149  Full code   Continuous     04/25/15 0149      TOTAL TIME TAKING CARE OF THIS PATIENT: 40 minutes.    Henreitta Leber M.D on 04/27/2015 at 4:49 PM  Between 7am to 6pm - Pager - [phone]  After 6pm go to [email or web address] - password [email or web address] [place] [facility]  Office  [phone]  CC: Primary care physician; [name], MD

## 2015-04-27 NOTE — Progress Notes (Signed)
Patient given discharge teaching and paperwork regarding medications, diet, follow-up appointments and activity. Patient understanding verbalized. No complaints at this time. IV and telemetry discontinued prior to leaving. Skin assessment as previously charted and vitals are stable; on room air. Patient being discharged to home. Wife and daughter present during discharge teaching. No further needs by Care Management - xarelto coupon given and home health set up. Prescriptions given to patient.

## 2015-04-27 NOTE — Progress Notes (Signed)
  CBG 167 per Candace NT.

## 2015-04-27 NOTE — Progress Notes (Signed)
Patient Name: Gregory Tyler Date of Encounter: 04/27/2015  Active Problems:   A-fib ([place])   Acute on chronic diastolic (congestive) heart failure (HCC)   Acute respiratory failure with hypoxia (HCC)   Persistent atrial fibrillation with rapid ventricular response (HCC)   Acute on chronic combined systolic and diastolic CHF (congestive heart failure) ([place])   Acute pulmonary edema (HCC)   Dyspnea   Coronary artery disease involving native coronary artery of native heart without angina pectoris   Essential hypertension    Primary Cardiologist: New to [facility] - Dr. Rockey Situ Patient Profile: 75 yo male w/ PMH of persistent atrial fibrillation (initally diagnosed in 2015, not placed on anticoagulation at that time), chronic combined systolic and diastolic CHF, HTN, Type 2 DM, and COPD admitted on 04/24/2015 for acute respiratory failure, found to be in atrial fibrillation w/ RVR.  SUBJECTIVE: Denies any shortness of breath at rest, palpitations, or chest pain. Became dizzy this morning with ambulation. Reports having spinal stenosis and had to rest several times this morning while ambulating down the hallway.   OBJECTIVE Filed Vitals:   04/26/15 0753 04/26/15 1101 04/26/15 2028 04/27/15 0513  BP: 140/81 124/84 118/86 113/78  Pulse: 72 87 83 86  Temp: 98.1 F (36.7 C) 97.9 F (36.6 C) 98 F (36.7 C) 97.7 F (36.5 C)  TempSrc: Oral  Oral Oral  Resp: 18 20 16 16   Height:      Weight:    163 lb 8 oz (74.163 kg)  SpO2: 96% 97% 98% 93%    Intake/Output Summary (Last 24 hours) at 04/27/15 0920 Last data filed at 04/27/15 0513  Gross per 24 hour  Intake    240 ml  Output   3150 ml  Net  -2910 ml   Filed Weights   04/25/15 0251 04/26/15 0549 04/27/15 0513  Weight: 180 lb 1.9 oz (81.7 kg) 169 lb (76.658 kg) 163 lb 8 oz (74.163 kg)    PHYSICAL EXAM General: Frail appearing elderly Caucasian male appearing in no acute distress. Head: Normocephalic, atraumatic.  Neck: Supple without  bruits, JVD not elevated. Lungs:  Resp regular and unlabored, CTA without wheezing or rales. Heart: Irregularly irregular, S1, S2, no S3, S4, or murmur; no rub. Abdomen: Soft, non-tender, non-distended with normoactive bowel sounds. No hepatomegaly. No rebound/guarding. No obvious abdominal masses. Extremities: No clubbing, cyanosis, or edema. Distal pedal pulses are 2+ bilaterally. Neuro: Alert and oriented X 3. Moves all extremities spontaneously. Psych: Normal affect.   LABS: CBC: Recent Labs  04/26/15 0529 04/27/15 0505  WBC 7.5 8.6  HGB 12.4* 14.2  HCT 37.9* 43.4  MCV 86.7 86.5  PLT 216 123456   Basic Metabolic Panel: Recent Labs  04/26/15 0529 04/27/15 0505  NA 138 138  K 3.8 4.9  CL 103 98*  CO2 28 32  GLUCOSE 139* 161*  BUN 35* 39*  CREATININE 1.50* 1.41*  CALCIUM 9.4 9.9   Liver Function Tests: Recent Labs  04/25/15 0001  AST 35  ALT 31  ALKPHOS 131*  BILITOT 0.9  PROT 8.1  ALBUMIN 4.4   Cardiac Enzymes: Recent Labs  04/25/15 0001 04/26/15 0529  TROPONINI 0.04* 0.06*   No results for input(s): TROPIPOC in the last 72 hours. BNP:  B NATRIURETIC PEPTIDE  Date/Time Value Ref Range Status  04/25/2015 12:04 AM 765.0* 0.0 - 100.0 pg/mL Final  02/08/2015 03:23 PM 589.0* 0.0 - 100.0 pg/mL Final    TELE:  Atrial fibrillation with rate in 80's - 110's.  No acute events.     ECHO: 04/25/2015 Study Conclusions - Left ventricle: The cavity size was normal. Systolic function was moderately reduced. The estimated ejection fraction was in the range of 35% to 40%. Diffuse hypokinesis. Hypokinesis of the anteroseptal and septal myocardium. The study is not technically sufficient to allow evaluation of LV diastolic function. - Mitral valve: There was mild to moderate regurgitation. - Left atrium: The atrium was mild to moderately dilated. - Right ventricle: The cavity size was mildly dilated. Wall thickness was normal. Systolic function was mildly  reduced. - Right atrium: The atrium was mildly dilated. - Pulmonary arteries: Systolic pressure was mildly elevated. PA peak pressure: 40 mm Hg (S).  Impressions: - Rhythm is atrial fibrillation.   Current Medications:  . allopurinol  300 mg Oral Daily  . budesonide-formoterol  2 puff Inhalation BID  . diltiazem  120 mg Oral Daily  . furosemide  40 mg Intravenous BID  . insulin aspart  0-15 Units Subcutaneous TID WC  . insulin aspart  0-5 Units Subcutaneous QHS  . isosorbide mononitrate  30 mg Oral Daily  . magnesium oxide  400 mg Oral Daily  . metoprolol tartrate  50 mg Oral BID  . rivaroxaban  15 mg Oral Daily  . rosuvastatin  20 mg Oral q1800      ASSESSMENT AND PLAN:  1. Chronic Atrial fibrillation - Rate improved with addition of Cardizem CD 120 mg daily. Would not increase at this time due to decreased EF. Continue Lopressor 50mg  BID. -This patients CHA2DS2-VASc Score and unadjusted Ischemic Stroke Rate (% per year) is equal to 7.2 % stroke rate/year from a score of 5 (CHF, HTN, DM, Age (2)). Continue Xarelto for anticoagulation. Patient is concerned about co-pay for the medication (being addressed by Case Management). May have to switch to Coumadin in the future if finances become an issue.  2. Acute respiratory distress - Off O2 currently, significantly improved. - Likely in the setting of acute on chronic combined CHF 2/2 Afib with RVR  3. Acute on chronic combined CHF - EF of 35-40% on recent echo - Net output of -8.5L this admission. - continue BB with possibility of switching to Toprol-XL in the outpatient setting. - transition IV Lasix to PO Lasix 40mg  daily. Instructed patient he could take additional daily PRN dose for weight gain/increased dyspnea. It is unclear if he understands and this will need to continue to be re-emphasized.   4. Acute renal injury - creatinine 2.10 on arrival. Unsure of baseline - improved to 1.41 on 04/26/2014 - Will need close  follow up and recheck bmet with PCP in 48 hours following discharge  5. CAD/cardiomyopathy - Note from prior cardiologist on 03/08/2014 documented cardiac cath in 2015 withCAD with no critical stenosis needing intervention  - ?EF improved from 2015. Was 25% in 2015. 35-40% on recent echo this admission - Medical management as above with BB, statin, and Imdur. No ACE-I due to elevated creatinine. - Changed from Simvastatin to Rosuvastatin on 04/25/2014 given patient on diltiazem  6. Polypharmacy -Patient would likely benefit from home health aid with medication management.    Appears stable for discharge from a Cardiology perspective. Encouraged the patient to ambulate with his nurse again since he experienced dizziness this AM. Also need to monitor HR with ambulation.   Signed, Erma Heritage , PA-C 9:20 AM 04/27/2015 Pager: [phone]   I have seen, examined and evaluated the patient this morning along with Bernerd Pho, PA-C.  After reviewing all the available data and chart,  I agree with her findings, examination as well as impression recommendations.  75 year old gentleman with persistent/chronic atrial fibrillation for over a year. He also has chronic combined systolic and diastolic heart failure -- from nonischemic cardiomyopathy --  exacerbated by A. fib with RVR. Came in with acute onset dyspnea. Notably improved after IV Lasix. Was felt to be planned for discharge yesterday, however he continues to be here. His rate is likely not as well-controlled this morning on telemetry. However when I went to see him actually his heart rate was in the 80s. He has not done much the way of ambulating, which may be a difficult issue given his spinal stenosis. We need to ensure that he is strong enough to ambulate and have some independence prior to discharge. Per yesterday's recommendation, would switch Lasix to by mouth. Would consider potentially reducing dose to 40 mg once a day with  additional dose when necessary.  He has close follow-up in heart failure clinic schedule. No active anginal symptoms.  He is currently on beta blocker, however this should probably should be converted to Toprol from Lopressor. Reluctantly he is on diltiazem with reduced EF, this is more for rate control.   Anticoagulated with Xarelto. No evidence to suggest recent bleeding issues.   I expect these should be ready for discharge soon. Would need to convert to by mouth Lasix.    Leonie Man, M.D., M.S. Interventional Cardiologist   Pager # [phone]

## 2015-05-09 ENCOUNTER — Ambulatory Visit (INDEPENDENT_AMBULATORY_CARE_PROVIDER_SITE_OTHER): Payer: Medicare Other | Admitting: Cardiovascular Disease

## 2015-05-09 ENCOUNTER — Encounter: Payer: Self-pay | Admitting: Cardiovascular Disease

## 2015-05-09 VITALS — BP 86/62 | HR 91 | Ht 68.0 in | Wt 158.2 lb

## 2015-05-09 DIAGNOSIS — I951 Orthostatic hypotension: Secondary | ICD-10-CM | POA: Insufficient documentation

## 2015-05-09 DIAGNOSIS — I481 Persistent atrial fibrillation: Secondary | ICD-10-CM | POA: Diagnosis not present

## 2015-05-09 DIAGNOSIS — I2510 Atherosclerotic heart disease of native coronary artery without angina pectoris: Secondary | ICD-10-CM

## 2015-05-09 DIAGNOSIS — R0602 Shortness of breath: Secondary | ICD-10-CM

## 2015-05-09 DIAGNOSIS — J9601 Acute respiratory failure with hypoxia: Secondary | ICD-10-CM

## 2015-05-09 DIAGNOSIS — I5033 Acute on chronic diastolic (congestive) heart failure: Secondary | ICD-10-CM

## 2015-05-09 DIAGNOSIS — I420 Dilated cardiomyopathy: Secondary | ICD-10-CM

## 2015-05-09 DIAGNOSIS — I10 Essential (primary) hypertension: Secondary | ICD-10-CM

## 2015-05-09 DIAGNOSIS — I4819 Other persistent atrial fibrillation: Secondary | ICD-10-CM

## 2015-05-09 NOTE — Progress Notes (Signed)
Patient ID: Gregory Tyler, male    DOB: [birth year], 75 y.o.   MRN: [id number]  HPI Comments:  Gregory Tyler is a pleasant 75 year old gentleman  With history of atrial fibrillation , diabetes, previously followed at [facility] ( Patient wanted to change  Cardiology group),  Who presented to the hospital 04/25/2015 with shortness of breath,  Found to have atrial fibrillation with RVR, severe hypertension,  Placed on BiPAP for respiratory distress.  Cardiology was called for consultation for Gregory Tyler atrial fibrillation with RVR.  he presents today for follow-up after recent hospitalization and to establish care in the [place] office.   the day prior to admission,He went to Gregory Tyler son's house for dinner.  After he got home, had some wheezing, started using Gregory Tyler albuterol inhaler, 4 times Shortly after developed acute shortness of breath, worsening wheezing He presented to the emergency room  He was started on  Diltiazem for rate control,  Benazepril was held,  Also started on Lasix for acute  Diastolic and systolic CHF.  Echocardiogram showing ejection fraction 35%.  He was continued on isosorbide and metoprolol,   Currently taking xarelto  For atrial fibrillation   Since Gregory Tyler discharge , he reports Gregory Tyler weight has dramatically decreased.  He is managed by advanced care and has had visiting nurses.  Reports Gregory Tyler weight was 168 when he got home ( was 182 on admission)  Today Gregory Tyler weight is 150 pounds at home, indicating 18 pound weight loss since discharge.  He was told by nurses that Gregory Tyler blood pressure was very low.  Unclear if any physician was contacted for medication adjustment.  Reports he has been very orthostatic , okay when sitting, dizzy when standing.   Blood pressure on today's visit is 86/62, heart rate in the 90s ,   EKG on today's visit shows atrial fibrillation with heart rate 85 bpm,  No significant ST or T-wave changes ( repolarization abnormality)    Allergies  Allergen Reactions  .  Codeine Sulfate     Other reaction(s): Other (See Comments) BLISTERS    Current Outpatient Prescriptions on File Prior to Visit  Medication Sig Dispense Refill  . allopurinol (ZYLOPRIM) 300 MG tablet Take 300 mg by mouth daily.     . budesonide-formoterol (SYMBICORT) 160-4.5 MCG/ACT inhaler Inhale 2 puffs into the lungs 2 (two) times daily.    Marland Kitchen diltiazem (CARDIZEM CD) 120 MG 24 hr capsule Take 1 capsule (120 mg total) by mouth daily. 60 capsule 1  . HYDROcodone-acetaminophen (NORCO/VICODIN) 5-325 MG per tablet Take 1 tablet by mouth every 4 (four) hours as needed for moderate pain.     . isosorbide mononitrate (IMDUR) 30 MG 24 hr tablet Take 30 mg by mouth daily.    . magnesium oxide (MAG-OX) 400 MG tablet Take 400 mg by mouth daily.     . metFORMIN (GLUCOPHAGE) 500 MG tablet Take 500 mg by mouth 2 (two) times daily.     . metoprolol tartrate (LOPRESSOR) 25 MG tablet Take 25 mg by mouth 2 (two) times daily.     . Rivaroxaban (XARELTO) 15 MG TABS tablet Take 1 tablet (15 mg total) by mouth daily. 60 tablet 1  . rosuvastatin (CRESTOR) 20 MG tablet Take 1 tablet (20 mg total) by mouth daily at 6 PM. 60 tablet 1   No current facility-administered medications on file prior to visit.    Past Medical History  Diagnosis Date  . Prostate cancer ([place])   . Arthritis   . Diabetes mellitus  without complication ([place])   . Hypertension   . CHF (congestive heart failure) (HCC)     Combined Systolic and Diastolic; EF 123456 in [id number]  . Emphysema lung ([place])   . Hyperlipidemia   . Gout   . Gastric ulcer   . Anemia   . Nephrolithiasis   . Coronary artery disease   . Anxiety   . Depression   . Chronic atrial fibrillation (HCC)     a. on Xarelto    Past Surgical History  Procedure Laterality Date  . Cholecystectomy    . Appendectomy    . Gastric bypass    . Hemorrhoid surgery    . Back surgery    . Pilonidal cyst      Social History  reports that he has quit smoking. He uses  smokeless tobacco. He reports that he does not drink alcohol or use illicit drugs.  Family History Family history is unknown by patient.   Review of Systems  Constitutional: Negative.   Respiratory: Negative.   Cardiovascular: Negative.   Gastrointestinal: Negative.   Musculoskeletal: Negative.   Neurological: Negative.   Hematological: Negative.   Psychiatric/Behavioral: Negative.   All other systems reviewed and are negative.   BP 86/62 mmHg  Pulse 91  Ht 5\' 8"  (1.727 m)  Wt 158 lb 4 oz (71.782 kg)  BMI 24.07 kg/m2  Physical Exam  Constitutional: He is oriented to person, place, and time. He appears well-developed and well-nourished.  HENT:  Head: Normocephalic.  Nose: Nose normal.  Mouth/Throat: Oropharynx is clear and moist.  Eyes: Conjunctivae are normal. Pupils are equal, round, and reactive to light.  Neck: Normal range of motion. Neck supple. No JVD present.  Cardiovascular: Normal rate, normal heart sounds and intact distal pulses.  An irregularly irregular rhythm present. Exam reveals no gallop and no friction rub.   No murmur heard. Pulmonary/Chest: Effort normal and breath sounds normal. No respiratory distress. He has no wheezes. He has no rales. He exhibits no tenderness.  Abdominal: Soft. Bowel sounds are normal. He exhibits no distension. There is no tenderness.  Musculoskeletal: Normal range of motion. He exhibits no edema or tenderness.  Lymphadenopathy:    He has no cervical adenopathy.  Neurological: He is alert and oriented to person, place, and time. Coordination normal.  Skin: Skin is warm and dry. No rash noted. No erythema.  Psychiatric: He has a normal mood and affect. Gregory Tyler behavior is normal. Judgment and thought content normal.

## 2015-05-09 NOTE — Patient Instructions (Signed)
Blood pressure is low, that is why you are dizzy  Please stop the lasix Stop the isosorbide today, restart in one week (05/15/2015)  Please call us if you have new issues that need to be addressed before your next appt.  Your physician wants you to follow-up in: 1 month.

## 2015-05-09 NOTE — Assessment & Plan Note (Signed)
Recent acute respiratory failure in the setting of atrial fibrillation with RVR, systolic CHF , severe hypertension.  Medication changes as above

## 2015-05-09 NOTE — Assessment & Plan Note (Signed)
Blood pressure is very low today, symptoms of orthostasis  Recommended he stop his Lasix  Also recommended he stop isosorbide for 1 week  Recommended he monitor blood pressure at home , he does have a visiting nurse  We have called the advanced care nurse, left messages for her to call us concerning his blood pressure

## 2015-05-09 NOTE — Assessment & Plan Note (Signed)
Heart rate is relatively well-controlled  recommended he continue diltiazem and metoprolol

## 2015-05-09 NOTE — Assessment & Plan Note (Signed)
Ejection fraction 35% up to 40%  Notes indicate prior cardiac catheterization  we'll try to obtain cardiac catheterization report for our records

## 2015-05-10 ENCOUNTER — Telehealth: Payer: Self-pay

## 2015-05-10 ENCOUNTER — Other Ambulatory Visit
Admission: RE | Admit: 2015-05-10 | Discharge: 2015-05-10 | Disposition: A | Discharge status: Home / Self Care | Payer: Medicare Other | Source: Ambulatory Visit | Attending: Cardiovascular Disease | Admitting: Cardiovascular Disease

## 2015-05-10 ENCOUNTER — Encounter: Payer: Self-pay | Admitting: Emergency Medicine

## 2015-05-10 ENCOUNTER — Other Ambulatory Visit: Payer: Self-pay

## 2015-05-10 ENCOUNTER — Emergency Department
Admission: EM | Admit: 2015-05-10 | Discharge: 2015-05-10 | Disposition: A | Discharge status: Home / Self Care | Payer: Medicare Other | Attending: Emergency Medicine | Admitting: Emergency Medicine

## 2015-05-10 DIAGNOSIS — R799 Abnormal finding of blood chemistry, unspecified: Secondary | ICD-10-CM | POA: Insufficient documentation

## 2015-05-10 DIAGNOSIS — E119 Type 2 diabetes mellitus without complications: Secondary | ICD-10-CM | POA: Insufficient documentation

## 2015-05-10 DIAGNOSIS — E875 Hyperkalemia: Secondary | ICD-10-CM

## 2015-05-10 DIAGNOSIS — Z87891 Personal history of nicotine dependence: Secondary | ICD-10-CM | POA: Insufficient documentation

## 2015-05-10 DIAGNOSIS — I10 Essential (primary) hypertension: Secondary | ICD-10-CM | POA: Insufficient documentation

## 2015-05-10 DIAGNOSIS — N179 Acute kidney failure, unspecified: Secondary | ICD-10-CM | POA: Diagnosis not present

## 2015-05-10 LAB — GLUCOSE, CAPILLARY: GLUCOSE-CAPILLARY: 167 mg/dL — AB (ref 65–99)

## 2015-05-10 LAB — BASIC METABOLIC PANEL
Anion gap: 9 (ref 5–15)
BUN / CREAT RATIO: 30 — AB (ref 10–22)
BUN: 67 mg/dL — AB (ref 8–27)
BUN: 67 mg/dL — AB (ref 6–20)
CHLORIDE: 95 mmol/L — AB (ref 96–106)
CO2: 15 mmol/L — ABNORMAL LOW (ref 18–29)
CO2: 22 mmol/L (ref 22–32)
CREATININE: 2.49 mg/dL — AB (ref 0.61–1.24)
Calcium: 10.4 mg/dL — ABNORMAL HIGH (ref 8.6–10.2)
Calcium: 9.7 mg/dL (ref 8.9–10.3)
Chloride: 97 mmol/L — ABNORMAL LOW (ref 101–111)
Creatinine, Ser: 2.27 mg/dL — ABNORMAL HIGH (ref 0.76–1.27)
GFR calc Af Amer: 31 mL/min/{1.73_m2} — ABNORMAL LOW (ref >59)
GFR calc Af Amer: 28 mL/min — ABNORMAL LOW (ref >60)
GFR calc non Af Amer: 27 mL/min/{1.73_m2} — ABNORMAL LOW (ref >59)
GFR, EST NON AFRICAN AMERICAN: 24 mL/min — AB (ref >60)
GLUCOSE: 290 mg/dL — AB (ref 65–99)
Glucose, Bld: 343 mg/dL — ABNORMAL HIGH (ref 65–99)
Potassium: 6.9 mmol/L (ref 3.5–5.2)
Potassium: 6.3 mmol/L — ABNORMAL HIGH (ref 3.5–5.1)
SODIUM: 133 mmol/L — AB (ref 134–144)
SODIUM: 128 mmol/L — AB (ref 135–145)

## 2015-05-10 NOTE — Telephone Encounter (Signed)
Spoke w/ pt.  Advised him that STAT repeat labs were abnormal: Low Na+, High K+, Low BP (SBP 80s), ARF and Glucose > 300. He reports that he is very weak. Advised him that Dr. Rockey Situ recommends he proceed to the ED, but only if he has someone drive him. Pt reports that his wife is w/ him and he can call his daughter.  He will proceed to [facility] ED now. Notified triage to expect pt.

## 2015-05-10 NOTE — ED Notes (Signed)
Blood work done this morning, blood work was then repeated this afternoon.  PCP's office then called patient and told patient to come to ED for IV fluid.

## 2015-05-11 ENCOUNTER — Inpatient Hospital Stay: Payer: Medicare Other

## 2015-05-11 ENCOUNTER — Inpatient Hospital Stay
Admission: EM | Admit: 2015-05-11 | Discharge: 2015-05-13 | DRG: 683 | Disposition: A | Discharge status: Home / Self Care | Payer: Medicare Other | Attending: Specialist | Admitting: Specialist

## 2015-05-11 DIAGNOSIS — E871 Hypo-osmolality and hyponatremia: Secondary | ICD-10-CM | POA: Diagnosis present

## 2015-05-11 DIAGNOSIS — Z79899 Other long term (current) drug therapy: Secondary | ICD-10-CM | POA: Diagnosis not present

## 2015-05-11 DIAGNOSIS — I429 Cardiomyopathy, unspecified: Secondary | ICD-10-CM | POA: Diagnosis present

## 2015-05-11 DIAGNOSIS — Z7984 Long term (current) use of oral hypoglycemic drugs: Secondary | ICD-10-CM | POA: Diagnosis not present

## 2015-05-11 DIAGNOSIS — Z8546 Personal history of malignant neoplasm of prostate: Secondary | ICD-10-CM

## 2015-05-11 DIAGNOSIS — I2510 Atherosclerotic heart disease of native coronary artery without angina pectoris: Secondary | ICD-10-CM | POA: Diagnosis present

## 2015-05-11 DIAGNOSIS — Z7901 Long term (current) use of anticoagulants: Secondary | ICD-10-CM

## 2015-05-11 DIAGNOSIS — I482 Chronic atrial fibrillation: Secondary | ICD-10-CM | POA: Diagnosis present

## 2015-05-11 DIAGNOSIS — F419 Anxiety disorder, unspecified: Secondary | ICD-10-CM | POA: Diagnosis present

## 2015-05-11 DIAGNOSIS — N182 Chronic kidney disease, stage 2 (mild): Secondary | ICD-10-CM | POA: Diagnosis present

## 2015-05-11 DIAGNOSIS — R0602 Shortness of breath: Secondary | ICD-10-CM | POA: Diagnosis not present

## 2015-05-11 DIAGNOSIS — I130 Hypertensive heart and chronic kidney disease with heart failure and stage 1 through stage 4 chronic kidney disease, or unspecified chronic kidney disease: Secondary | ICD-10-CM | POA: Diagnosis present

## 2015-05-11 DIAGNOSIS — N179 Acute kidney failure, unspecified: Secondary | ICD-10-CM | POA: Diagnosis present

## 2015-05-11 DIAGNOSIS — Z886 Allergy status to analgesic agent status: Secondary | ICD-10-CM | POA: Diagnosis not present

## 2015-05-11 DIAGNOSIS — E875 Hyperkalemia: Secondary | ICD-10-CM | POA: Diagnosis present

## 2015-05-11 DIAGNOSIS — M109 Gout, unspecified: Secondary | ICD-10-CM | POA: Diagnosis present

## 2015-05-11 DIAGNOSIS — J449 Chronic obstructive pulmonary disease, unspecified: Secondary | ICD-10-CM | POA: Diagnosis present

## 2015-05-11 DIAGNOSIS — E785 Hyperlipidemia, unspecified: Secondary | ICD-10-CM | POA: Diagnosis present

## 2015-05-11 DIAGNOSIS — T502X5A Adverse effect of carbonic-anhydrase inhibitors, benzothiadiazides and other diuretics, initial encounter: Secondary | ICD-10-CM | POA: Diagnosis present

## 2015-05-11 DIAGNOSIS — F329 Major depressive disorder, single episode, unspecified: Secondary | ICD-10-CM | POA: Diagnosis present

## 2015-05-11 DIAGNOSIS — M1990 Unspecified osteoarthritis, unspecified site: Secondary | ICD-10-CM | POA: Diagnosis present

## 2015-05-11 DIAGNOSIS — E1122 Type 2 diabetes mellitus with diabetic chronic kidney disease: Secondary | ICD-10-CM | POA: Diagnosis present

## 2015-05-11 DIAGNOSIS — Z885 Allergy status to narcotic agent status: Secondary | ICD-10-CM

## 2015-05-11 DIAGNOSIS — I4891 Unspecified atrial fibrillation: Secondary | ICD-10-CM | POA: Diagnosis not present

## 2015-05-11 DIAGNOSIS — I5042 Chronic combined systolic (congestive) and diastolic (congestive) heart failure: Secondary | ICD-10-CM | POA: Diagnosis present

## 2015-05-11 DIAGNOSIS — I959 Hypotension, unspecified: Secondary | ICD-10-CM | POA: Diagnosis present

## 2015-05-11 DIAGNOSIS — E860 Dehydration: Secondary | ICD-10-CM | POA: Insufficient documentation

## 2015-05-11 LAB — URINALYSIS COMPLETE WITH MICROSCOPIC (ARMC ONLY)
BILIRUBIN URINE: NEGATIVE
Glucose, UA: NEGATIVE mg/dL
KETONES UR: NEGATIVE mg/dL
Leukocytes, UA: NEGATIVE
NITRITE: NEGATIVE
Protein, ur: 30 mg/dL — AB
Specific Gravity, Urine: 1.013 (ref 1.005–1.030)
pH: 5 (ref 5.0–8.0)

## 2015-05-11 LAB — COMPREHENSIVE METABOLIC PANEL
ALK PHOS: 132 U/L — AB (ref 38–126)
ALT: 48 U/L (ref 17–63)
AST: 41 U/L (ref 15–41)
Albumin: 4.4 g/dL (ref 3.5–5.0)
Anion gap: 12 (ref 5–15)
BILIRUBIN TOTAL: 1.3 mg/dL — AB (ref 0.3–1.2)
BUN: 62 mg/dL — AB (ref 6–20)
CALCIUM: 9.9 mg/dL (ref 8.9–10.3)
CHLORIDE: 96 mmol/L — AB (ref 101–111)
CO2: 21 mmol/L — ABNORMAL LOW (ref 22–32)
CREATININE: 2.19 mg/dL — AB (ref 0.61–1.24)
GFR, EST AFRICAN AMERICAN: 32 mL/min — AB (ref >60)
GFR, EST NON AFRICAN AMERICAN: 28 mL/min — AB (ref >60)
Glucose, Bld: 202 mg/dL — ABNORMAL HIGH (ref 65–99)
Potassium: 5.7 mmol/L — ABNORMAL HIGH (ref 3.5–5.1)
Sodium: 129 mmol/L — ABNORMAL LOW (ref 135–145)
TOTAL PROTEIN: 8.5 g/dL — AB (ref 6.5–8.1)

## 2015-05-11 LAB — CBC WITH DIFFERENTIAL/PLATELET
Basophils Absolute: 0 10*3/uL (ref 0–0.1)
EOS ABS: 0.1 10*3/uL (ref 0–0.7)
HCT: 46.9 % (ref 40.0–52.0)
Hemoglobin: 15.3 g/dL (ref 13.0–18.0)
LYMPHS ABS: 2 10*3/uL (ref 1.0–3.6)
Lymphocytes Relative: 16 %
MCH: 28.2 pg (ref 26.0–34.0)
MCHC: 32.7 g/dL (ref 32.0–36.0)
MCV: 86.1 fL (ref 80.0–100.0)
Monocytes Absolute: 1 10*3/uL (ref 0.2–1.0)
Monocytes Relative: 8 %
Neutro Abs: 9.7 10*3/uL — ABNORMAL HIGH (ref 1.4–6.5)
Neutrophils Relative %: 75 %
PLATELETS: 239 10*3/uL (ref 150–440)
RBC: 5.45 MIL/uL (ref 4.40–5.90)
RDW: 15.5 % — ABNORMAL HIGH (ref 11.5–14.5)
WBC: 12.8 10*3/uL — AB (ref 3.8–10.6)

## 2015-05-11 LAB — GLUCOSE, CAPILLARY
Glucose-Capillary: 155 mg/dL — ABNORMAL HIGH (ref 65–99)
Glucose-Capillary: 326 mg/dL — ABNORMAL HIGH (ref 65–99)
Glucose-Capillary: 168 mg/dL — ABNORMAL HIGH (ref 65–99)

## 2015-05-11 LAB — HEMOGLOBIN A1C: Hgb A1c MFr Bld: 8 % — ABNORMAL HIGH (ref 4.0–6.0)

## 2015-05-11 LAB — TROPONIN I
Troponin I: 0.03 ng/mL (ref <0.031)
Troponin I: 0.03 ng/mL (ref <0.031)

## 2015-05-11 MED ORDER — MAGNESIUM OXIDE 400 (241.3 MG) MG PO TABS
ORAL_TABLET | ORAL | Status: AC
  Administered 2015-05-11: 400 mg via ORAL
  Filled 2015-05-11: qty 1

## 2015-05-11 MED ORDER — HEPARIN SODIUM (PORCINE) 5000 UNIT/ML IJ SOLN: 5000.0000 [IU] | Freq: Three times a day (TID) | INTRAMUSCULAR | Status: DC

## 2015-05-11 MED ORDER — ACETAMINOPHEN 325 MG PO TABS
650.0000 mg | ORAL_TABLET | Freq: Four times a day (QID) | ORAL | Status: DC | PRN
Start: 2015-05-11 — End: 2015-05-13

## 2015-05-11 MED ORDER — DILTIAZEM HCL ER COATED BEADS 120 MG PO CP24
120.0000 mg | ORAL_CAPSULE | Freq: Every day | ORAL | Status: DC
  Administered 2015-05-11 – 2015-05-13 (×3): 120 mg via ORAL
  Filled 2015-05-11 (×3): qty 1

## 2015-05-11 MED ORDER — ISOSORBIDE MONONITRATE ER 30 MG PO TB24
30.0000 mg | ORAL_TABLET | Freq: Every day | ORAL | Status: DC
  Administered 2015-05-11 – 2015-05-13 (×3): 30 mg via ORAL
  Filled 2015-05-11 (×3): qty 1

## 2015-05-11 MED ORDER — INSULIN ASPART 100 UNIT/ML ~~LOC~~ SOLN: 0.0000 [IU] | Freq: Every day | SUBCUTANEOUS | Status: DC

## 2015-05-11 MED ORDER — SODIUM CHLORIDE 0.9 % IV SOLN
INTRAVENOUS | Status: DC
Start: 2015-05-11 — End: 2015-05-13
  Administered 2015-05-11 – 2015-05-12 (×3): via INTRAVENOUS

## 2015-05-11 MED ORDER — METOPROLOL TARTRATE 25 MG PO TABS
25.0000 mg | ORAL_TABLET | Freq: Two times a day (BID) | ORAL | Status: DC
  Administered 2015-05-11 – 2015-05-13 (×5): 25 mg via ORAL
  Filled 2015-05-11 (×5): qty 1

## 2015-05-11 MED ORDER — SODIUM CHLORIDE 0.9 % IJ SOLN
3.0000 mL | Freq: Two times a day (BID) | INTRAMUSCULAR | Status: DC
  Administered 2015-05-11 – 2015-05-13 (×4): 3 mL via INTRAVENOUS

## 2015-05-11 MED ORDER — HYDROCODONE-ACETAMINOPHEN 5-325 MG PO TABS: 1.0000 | ORAL_TABLET | ORAL | Status: DC | PRN

## 2015-05-11 MED ORDER — BUDESONIDE-FORMOTEROL FUMARATE 160-4.5 MCG/ACT IN AERO
2.0000 | INHALATION_SPRAY | Freq: Two times a day (BID) | RESPIRATORY_TRACT | Status: DC
  Administered 2015-05-11 – 2015-05-13 (×4): 2 via RESPIRATORY_TRACT
  Filled 2015-05-11: qty 6

## 2015-05-11 MED ORDER — ACETAMINOPHEN 650 MG RE SUPP: 650.0000 mg | Freq: Four times a day (QID) | RECTAL | Status: DC | PRN

## 2015-05-11 MED ORDER — INSULIN ASPART 100 UNIT/ML ~~LOC~~ SOLN
0.0000 [IU] | Freq: Three times a day (TID) | SUBCUTANEOUS | Status: DC
  Administered 2015-05-11: 2 [IU] via SUBCUTANEOUS
  Administered 2015-05-11: 7 [IU] via SUBCUTANEOUS
  Administered 2015-05-12 (×2): 2 [IU] via SUBCUTANEOUS
  Administered 2015-05-12: 1 [IU] via SUBCUTANEOUS
  Filled 2015-05-11: qty 1
  Filled 2015-05-11 (×3): qty 2
  Filled 2015-05-11: qty 7

## 2015-05-11 MED ORDER — RIVAROXABAN 15 MG PO TABS
15.0000 mg | ORAL_TABLET | Freq: Every day | ORAL | Status: DC
  Administered 2015-05-11 – 2015-05-13 (×3): 15 mg via ORAL
  Filled 2015-05-11 (×3): qty 1

## 2015-05-11 MED ORDER — ALBUTEROL SULFATE (2.5 MG/3ML) 0.083% IN NEBU
2.5000 mg | INHALATION_SOLUTION | Freq: Once | RESPIRATORY_TRACT | Status: AC
  Administered 2015-05-11: 2.5 mg via RESPIRATORY_TRACT
  Filled 2015-05-11: qty 3

## 2015-05-11 MED ORDER — ROSUVASTATIN CALCIUM 20 MG PO TABS
20.0000 mg | ORAL_TABLET | Freq: Every day | ORAL | Status: DC
  Administered 2015-05-11 – 2015-05-12 (×2): 20 mg via ORAL
  Filled 2015-05-11 (×2): qty 1

## 2015-05-11 MED ORDER — SODIUM CHLORIDE 0.9 % IV SOLN
Freq: Once | INTRAVENOUS | Status: AC
  Administered 2015-05-11: 11:00:00 via INTRAVENOUS

## 2015-05-11 MED ORDER — MAGNESIUM OXIDE 400 (241.3 MG) MG PO TABS
400.0000 mg | ORAL_TABLET | Freq: Every day | ORAL | Status: DC
  Administered 2015-05-11 – 2015-05-13 (×3): 400 mg via ORAL
  Filled 2015-05-11 (×4): qty 1

## 2015-05-11 NOTE — H&P (Signed)
[place] [facility] [place] NAME: Gregory Tyler    MR#:  [id number]  DATE OF BIRTH:  Jul 03, 1940  DATE OF ADMISSION:  05/11/2015  PRIMARY CARE PHYSICIAN: SPARKS,JEFFREY D, MD   REQUESTING/REFERRING PHYSICIAN: Dr Cinda Quest  CHIEF COMPLAINT:   Chief Complaint  Patient presents with  . Weakness                                 HISTORY OF PRESENT ILLNESS:  Gregory Tyler  is a 75 y.o. male with a known history of congestive heart failure ejection fraction 35-40%, atrial fibrillation chronic, diabetes, coronary artery disease and hypertension who presents today due to abnormal lab values indicating acute renal failure and hyperkalemia. He was hospitalized at [facility] from 1/1 - 1/4 and at that time the congestive heart failure and atrial fibrillation were diagnosed. He also had acute renal failure during that hospitalization which seem to be improving by the time of discharge. Since the time of discharge she reports that he has been very weak. He has been stumbling around his home, dizzy, sometimes unable to see. He has had shortness of breath with any exertion. Appetite is been decreased no nausea vomiting or diarrhea. He has not had any fevers, chills, sputum production or cough. He has had palpitations but no chest pain. He has tried to be compliant with the new medications prescribed since hospitalization and also at his cardiology follow-up appointment 2 days ago, but this has been difficult for him. He does have a home health nurse has been seeing him at home. He is being admitted for acute renal failure and atrial fibrillation with RVR.  PAST MEDICAL HISTORY:   Past Medical History  Diagnosis Date  . Prostate cancer ([place])   . Arthritis   . Diabetes mellitus without complication ([place])   . Hypertension   . CHF (congestive heart failure) (HCC)     Combined Systolic and Diastolic; EF 123456 in [id number]  . Emphysema lung ([place])   . Hyperlipidemia    . Gout   . Gastric ulcer   . Anemia   . Nephrolithiasis   . Coronary artery disease   . Anxiety   . Depression   . Chronic atrial fibrillation (HCC)     a. on Xarelto  . Shortness of breath dyspnea     PAST SURGICAL HISTORY:   Past Surgical History  Procedure Laterality Date  . Cholecystectomy    . Appendectomy    . Gastric bypass    . Hemorrhoid surgery    . Back surgery    . Pilonidal cyst      SOCIAL HISTORY:   Social History  Substance Use Topics  . Smoking status: Former Research scientist (life sciences)  . Smokeless tobacco: Current User  . Alcohol Use: No    FAMILY HISTORY:   Family History  Problem Relation Age of Onset  . CAD Mother   . Stroke Mother   . Diabetes Other     DRUG ALLERGIES:   Allergies  Allergen Reactions  . Aspirin     GI Bleeding  . Codeine Sulfate     Other reaction(s): Other (See Comments) BLISTERS    REVIEW OF SYSTEMS:   Review of Systems  Constitutional: Positive for weight loss and malaise/fatigue. Negative for fever and chills.  HENT: Negative for congestion and hearing loss.   Eyes: Positive for blurred vision. Negative for pain.  Respiratory:  Positive for shortness of breath. Negative for cough, hemoptysis, sputum production and stridor.   Cardiovascular: Positive for palpitations. Negative for chest pain, orthopnea and leg swelling.  Gastrointestinal: Negative for nausea, vomiting, abdominal pain, diarrhea, constipation and blood in stool.  Genitourinary: Negative for dysuria and frequency.  Musculoskeletal: Negative for myalgias, back pain, joint pain and neck pain.  Skin: Negative for rash.  Neurological: Positive for dizziness and weakness. Negative for focal weakness, loss of consciousness and headaches.  Endo/Heme/Allergies: Does not bruise/bleed easily.  Psychiatric/Behavioral: Negative for depression and hallucinations. The patient is not nervous/anxious.     MEDICATIONS AT HOME:   Prior to Admission medications   Medication  Sig Start Date End Date Taking? Authorizing Provider  allopurinol (ZYLOPRIM) 300 MG tablet Take 300 mg by mouth daily.    Yes Historical Provider, MD  budesonide-formoterol (SYMBICORT) 160-4.5 MCG/ACT inhaler Inhale 2 puffs into the lungs 2 (two) times daily.   Yes Historical Provider, MD  diltiazem (CARDIZEM CD) 120 MG 24 hr capsule Take 1 capsule (120 mg total) by mouth daily. 04/27/15  Yes Henreitta Leber, MD  HYDROcodone-acetaminophen (NORCO/VICODIN) 5-325 MG per tablet Take 1 tablet by mouth every 4 (four) hours as needed for moderate pain.    Yes Historical Provider, MD  isosorbide mononitrate (IMDUR) 30 MG 24 hr tablet Take 30 mg by mouth daily.   Yes Historical Provider, MD  magnesium oxide (MAG-OX) 400 MG tablet Take 400 mg by mouth daily.    Yes Historical Provider, MD  metFORMIN (GLUCOPHAGE) 500 MG tablet Take 500 mg by mouth 2 (two) times daily.    Yes Historical Provider, MD  metoprolol tartrate (LOPRESSOR) 25 MG tablet Take 25 mg by mouth 2 (two) times daily.    Yes Historical Provider, MD  Rivaroxaban (XARELTO) 15 MG TABS tablet Take 1 tablet (15 mg total) by mouth daily. 04/27/15  Yes Henreitta Leber, MD  rosuvastatin (CRESTOR) 20 MG tablet Take 1 tablet (20 mg total) by mouth daily at 6 PM. 04/27/15  Yes Henreitta Leber, MD      VITAL SIGNS:  Blood pressure 136/89, pulse 115, temperature 97.6 F (36.4 C), temperature source Oral, resp. rate 18, height 5\' 8"  (1.727 m), weight 68.04 kg (150 lb), SpO2 100 %.  PHYSICAL EXAMINATION:  GENERAL:  75 y.o.-year-old patient lying in the bed with no acute distress. Frail EYES: Pupils equal, round, reactive to light and accommodation. No scleral icterus. Extraocular muscles intact.  HEENT: Head atraumatic, normocephalic. Oropharynx and nasopharynx clear. Mucous membranes pink and moist NECK:  Supple, no jugular venous distention. No thyroid enlargement, no tenderness.  LUNGS: Normal breath sounds bilaterally, no wheezing, rales,rhonchi or  crepitation. No use of accessory muscles of respiration. Has jagged inspiration, seems to occur in several steps. No distress CARDIOVASCULAR: S1, S2 normal. No murmurs, rubs, or gallops. Tachycardic and irregular ABDOMEN: Soft, nontender, nondistended. Bowel sounds present. No organomegaly or mass.  EXTREMITIES: No pedal edema, cyanosis, or clubbing. Pulses 1+ NEUROLOGIC: Cranial nerves II through XII are intact. Muscle strength 5/5 in all extremities. Sensation intact. Gait not checked.  PSYCHIATRIC: The patient is alert and oriented x 3. Tearful and anxious SKIN: No obvious rash, lesion, or ulcer.   LABORATORY PANEL:   CBC  Recent Labs Lab 05/11/15 0922  WBC 12.8*  HGB 15.3  HCT 46.9  PLT 239   ------------------------------------------------------------------------------------------------------------------  Chemistries   Recent Labs Lab 05/11/15 0922  NA 129*  K 5.7*  CL 96*  CO2 21*  GLUCOSE 202*  BUN 62*  CREATININE 2.19*  CALCIUM 9.9  AST 41  ALT 48  ALKPHOS 132*  BILITOT 1.3*   ------------------------------------------------------------------------------------------------------------------  Cardiac Enzymes No results for input(s): TROPONINI in the last 168 hours. ------------------------------------------------------------------------------------------------------------------  RADIOLOGY:  No results found.  EKG:   Orders placed or performed during the hospital encounter of 05/11/15  . EKG 12-Lead  . EKG 12-Lead  . ED EKG  . ED EKG    IMPRESSION AND PLAN:   1. Atrial fibrillation with rapid ventricular rate: During my conversation with this patient and his heart rate went from 105-154. He has not taken his morning medications. We'll go ahead and give oral Cardizem and metoprolol. Monitor on telemetry. We'll administer IV Cardizem if needed. Blood pressure is stable. Consult cardiology to assist with management. Continue Xarelto  2. Acute renal  failure: Likely due to diuresis during admission and decreased oral intake since discharge. Continue gentle hydration. Obtain a UA. Hold nephrotoxins including metformin and diuretics at this time. If renal function does not improve over the next 24 hours would consider nephrology consultation.  3. Hyperkalemia: Potassium has been trending downwards, 2 days ago was 6.9 now is 5.7. He has received albuterol nebs and continues to receive IV fluids. We'll continue to monitor. If remains elevated would give Kayexalate. No peaked T waves or additional EKG changes.  4. Hyponatremia: Likely due to decreased oral intake. Administering IV fluids at this time. We'll repeat labs this afternoon.  5. Congestive heart failure: Ejection fraction 35-40%. No exacerbation is suspected at this time. We'll get a chest x-ray as he continues to be short of breath. We'll check a BNP. I am holding Lasix at this time. We'll be on a low-sodium diet with I's and O monitoring and daily weights.  6. COPD: No wheezing though he does seem short of breath. Obtain chest x-ray. Continue Symbicort  7. Coronary artery disease: No chest pain at this time. We'll cycle cardiac enzymes due to history of coronary artery disease, new congestive heart failure, progressive weakness and shortness of breath. Monitor on telemetry. Continue beta blocker, nitrates, Xarelto.  8. Diabetes mellitus type 2: Hold metformin at this time. Start sliding scale insulin during hospitalization. Check A1c  All the records are reviewed and case discussed with ED provider. Management plans discussed with the patient and they are in agreement.  CODE STATUS: Full code. Patient states that he would not want to be put on long-term life support. He is planning to have a conversation with his primary care physician in the near future about this.  TOTAL TIME TAKING CARE OF THIS PATIENT: 55 minutes.  Greater than 50% of time spent in coordination of care and  counseling.  Myrtis Ser M.D on 05/11/2015 at 11:30 AM  Between 7am to 6pm - Pager - [phone]  After 6pm go to [email or web address] - password [email or web address] [facility]  Office  [phone]  CC: Primary care physician; [name], MD

## 2015-05-11 NOTE — Progress Notes (Signed)
A & O. Up to BR with cane. Room air. A fib. FS are stable. Pt reports no pain. No further concerns at this time.

## 2015-05-11 NOTE — Consult Note (Signed)
Cardiology Consultation Note  Patient ID: Gregory Tyler, MRN: [id number], DOB/AGE: Sep 22, 1940 75 y.o. Admit date: 05/11/2015   Date of Consult: 05/11/2015 Primary Physician: [name], MD Primary Cardiologist: Dr. Rockey Situ, MD  Chief Complaint: Weakness Reason for Consult: Afib with RVR  HPI: 75 y.o. male with h/o CAD with prior cath in 02/2014 not available, chronic Afib since at least 01/2014 on Xarelto, chronic combined CHF, emphysema, HTN, orthostatic hypotension, prostate cancer, and DM2 who was recently admitted to [facility] from 1/1 to 1/4 for acute combined CHF and Afib with RVR returned to [facility] ED on 1/17 with weakness and was found to be volume depleted and in Afib with RVR.   During his admission in early January as above he was starte don diltiazem for rate control, benazepril was held and he was started on Lasix for acute combined CHF. Echo showed an EF of 35-40%, PASP 40 mm Hg. He was started on Xarelto for anticoagulation. In hospital follow up with Dr. Rockey Situ on 05/09/2015 he reported a weight of 168 upon arriving home on 1/4, prior weight of 182 upon admission to [facility] on 1/1. At his OV on 1/16 he weighed 150 pounds. His BP had been running low and was 86/62 at his OV on 1/16. At that time his Lasix and Imdur were held. He reported decreased PO intake. Labs showed a K+ of 6.9, though there were hemolyzed cells, SCr 2.27, BUN 67. Recheck K+ showed a K+ of 6.3, SCr of 2.49, BUN of 67. He was advised to go to the ED. Instead he decided to wait and went to his PCP's office on 1/18 and had a repeat bmet that showed a K+ of 5.7 with a SCr of 2.2, BUN 59. He was again advised to go to the ED.   Upon his arrival the [facility] ED he complained of increasing weakness, generalized fatigue, and dizziness. He was found to be in Afib with RVR with heart rates in the 140s. He denied any chest pain, palpitations, orthopnea, PND, increased cough, presyncope, or syncope. His admission weight was found to be  150 pounds (same as above). He was also found to have SCr 2.19, BUN 62, K+ 5.7. ECG as below, CXR showed no edema or consolidation. He was started on Cardizem and Lopressor. HR has improved to the low 100's. He has received gentle IV hydration for his renal function and albuterol nebs/IV fluids for his hyperkalemia. There have been no concerning ECG changes at this time. Nephrotoxic medications have been held. BP has improved since his OV with cardiology with readings currently ranging in the low [id number] to [id number] systolic. He has received 1L of normal saline in the ED and is not currently receiving IV fluids. He is currently asymptomatic.    Past Medical History  Diagnosis Date  . Prostate cancer ([place])   . Arthritis   . Diabetes mellitus without complication ([place])   . Hypertension   . CHF (congestive heart failure) (HCC)     Combined Systolic and Diastolic; EF 123456 in [id number]  . Emphysema lung ([place])   . Hyperlipidemia   . Gout   . Gastric ulcer   . Anemia   . Nephrolithiasis   . Coronary artery disease   . Anxiety   . Depression   . Chronic atrial fibrillation (HCC)     a. on Xarelto  . Shortness of breath dyspnea       Most Recent Cardiac Studies: Echo 04/25/2015: Study Conclusions - Left ventricle: The  cavity size was normal. Systolic function was moderately reduced. The estimated ejection fraction was in the range of 35% to 40%. Diffuse hypokinesis. Hypokinesis of the anteroseptal and septal myocardium. The study is not technically sufficient to allow evaluation of LV diastolic function. - Mitral valve: There was mild to moderate regurgitation. - Left atrium: The atrium was mild to moderately dilated. - Right ventricle: The cavity size was mildly dilated. Wall thickness was normal. Systolic function was mildly reduced. - Right atrium: The atrium was mildly dilated. - Pulmonary arteries: Systolic pressure was mildly elevated. PA peak pressure: 40 mm Hg  (S). Impressions: - Rhythm is atrial fibrillation.   Surgical History:  Past Surgical History  Procedure Laterality Date  . Cholecystectomy    . Appendectomy    . Gastric bypass    . Hemorrhoid surgery    . Back surgery    . Pilonidal cyst       Home Meds: Prior to Admission medications   Medication Sig Start Date End Date Taking? Authorizing Provider  allopurinol (ZYLOPRIM) 300 MG tablet Take 300 mg by mouth daily.    Yes Historical Provider, MD  budesonide-formoterol (SYMBICORT) 160-4.5 MCG/ACT inhaler Inhale 2 puffs into the lungs 2 (two) times daily.   Yes Historical Provider, MD  diltiazem (CARDIZEM CD) 120 MG 24 hr capsule Take 1 capsule (120 mg total) by mouth daily. 04/27/15  Yes Henreitta Leber, MD  HYDROcodone-acetaminophen (NORCO/VICODIN) 5-325 MG per tablet Take 1 tablet by mouth every 4 (four) hours as needed for moderate pain.    Yes Historical Provider, MD  isosorbide mononitrate (IMDUR) 30 MG 24 hr tablet Take 30 mg by mouth daily.   Yes Historical Provider, MD  magnesium oxide (MAG-OX) 400 MG tablet Take 400 mg by mouth daily.    Yes Historical Provider, MD  metFORMIN (GLUCOPHAGE) 500 MG tablet Take 500 mg by mouth 2 (two) times daily.    Yes Historical Provider, MD  metoprolol tartrate (LOPRESSOR) 25 MG tablet Take 25 mg by mouth 2 (two) times daily.    Yes Historical Provider, MD  Rivaroxaban (XARELTO) 15 MG TABS tablet Take 1 tablet (15 mg total) by mouth daily. 04/27/15  Yes Henreitta Leber, MD  rosuvastatin (CRESTOR) 20 MG tablet Take 1 tablet (20 mg total) by mouth daily at 6 PM. 04/27/15  Yes Henreitta Leber, MD    Inpatient Medications:  . budesonide-formoterol  2 puff Inhalation BID  . diltiazem  120 mg Oral Daily  . insulin aspart  0-5 Units Subcutaneous QHS  . insulin aspart  0-9 Units Subcutaneous TID WC  . isosorbide mononitrate  30 mg Oral Daily  . magnesium oxide  400 mg Oral Daily  . metoprolol tartrate  25 mg Oral BID  . Rivaroxaban  15 mg Oral Daily   . rosuvastatin  20 mg Oral q1800  . sodium chloride  3 mL Intravenous Q12H      Allergies:  Allergies  Allergen Reactions  . Aspirin     GI Bleeding  . Codeine Sulfate     Other reaction(s): Other (See Comments) BLISTERS    Social History   Social History  . Marital Status: Married    Spouse Name: N/A  . Number of Children: N/A  . Years of Education: N/A   Occupational History  . Not on file.   Social History Main Topics  . Smoking status: Former Research scientist (life sciences)  . Smokeless tobacco: Current User  . Alcohol Use: No  . Drug Use:  No  . Sexual Activity: Not on file   Other Topics Concern  . Not on file   Social History Narrative     Family History  Problem Relation Age of Onset  . CAD Mother   . Stroke Mother   . Diabetes Other      Review of Systems: Review of Systems  Constitutional: Positive for weight loss and malaise/fatigue. Negative for fever, chills and diaphoresis.  HENT: Negative for congestion.   Eyes: Negative for discharge and redness.  Respiratory: Positive for shortness of breath and wheezing. Negative for cough, hemoptysis and sputum production.   Cardiovascular: Negative for chest pain, palpitations, orthopnea, claudication, leg swelling and PND.  Gastrointestinal: Negative for heartburn, nausea, vomiting and abdominal pain.  Musculoskeletal: Positive for myalgias. Negative for back pain, joint pain, falls and neck pain.  Skin: Negative for rash.  Neurological: Positive for dizziness and weakness. Negative for tingling, tremors, sensory change, speech change, focal weakness, seizures, loss of consciousness and headaches.  Endo/Heme/Allergies: Does not bruise/bleed easily.  Psychiatric/Behavioral: The patient is not nervous/anxious.   All other systems reviewed and are negative.    Labs: No results for input(s): CKTOTAL, CKMB, TROPONINI in the last 72 hours. Lab Results  Component Value Date   WBC 12.8* 05/11/2015   HGB 15.3 05/11/2015   HCT  46.9 05/11/2015   MCV 86.1 05/11/2015   PLT 239 05/11/2015    Recent Labs Lab 05/11/15 0922  NA 129*  K 5.7*  CL 96*  CO2 21*  BUN 62*  CREATININE 2.19*  CALCIUM 9.9  PROT 8.5*  BILITOT 1.3*  ALKPHOS 132*  ALT 48  AST 41  GLUCOSE 202*   No results found for: CHOL, HDL, LDLCALC, TRIG No results found for: DDIMER  Radiology/Studies:  Dg Chest 2 View  05/11/2015  CLINICAL DATA:  Chronic atrial fibrillation. Apneic episode 3 weeks prior EXAM: CHEST  2 VIEW COMPARISON:  April 25, 2015 FINDINGS: There is no edema or consolidation. There are apparent nipple shadows bilaterally. Heart size and pulmonary vascularity are normal. No adenopathy. There is degenerative change in the thoracic spine. There are multiple surgical clips in the upper abdomen. IMPRESSION: No edema or consolidation. Electronically Signed   By: Lowella Grip III M.D.   On: 05/11/2015 12:03   Dg Chest Portable 1 View  04/25/2015  CLINICAL DATA:  Shortness of breath. EXAM: PORTABLE CHEST 1 VIEW COMPARISON:  02/06/2015 FINDINGS: The heart is enlarged. Bilateral perihilar opacities and indistinct pulmonary vasculature concerning for pulmonary edema, right greater than left. No confluent airspace disease, large pleural effusion or pneumothorax. IMPRESSION: Cardiomegaly and pulmonary edema, most consistent with CHF. Electronically Signed   By: Jeb Levering M.D.   On: 04/25/2015 01:05    EKG: Afib with RVR, 110 bpm, nonspecific anterior st elevation possibly 2/2 early repolarization, lateral TWI  Weights: Filed Weights   05/11/15 0915  Weight: 150 lb (68.04 kg)     Physical Exam: Blood pressure 108/90, pulse 103, temperature 98.3 F (36.8 C), temperature source Oral, resp. rate 20, height 5\' 8"  (1.727 m), weight 150 lb (68.04 kg), SpO2 100 %. Body mass index is 22.81 kg/(m^2). General: Frail appearing, in no acute distress. Head: Normocephalic, atraumatic, sclera non-icteric, no xanthomas, nares are without  discharge.  Neck: Negative for carotid bruits. JVD not elevated. Lungs: Clear bilaterally to auscultation without wheezes, rales, or rhonchi. Breathing is unlabored. Heart: Irregularly-irregular, with S1 S2. No murmurs, rubs, or gallops appreciated. Abdomen: Soft, non-tender, non-distended with normoactive  bowel sounds. No hepatomegaly. No rebound/guarding. No obvious abdominal masses. Msk:  Strength and tone appear normal for age. Extremities: No clubbing or cyanosis. No edema.  Distal pedal pulses are 2+ and equal bilaterally. Neuro: Alert and oriented X 3. No facial asymmetry. No focal deficit. Moves all extremities spontaneously. Psych:  Responds to questions appropriately with a normal affect.    Assessment and Plan:   1. Chronic Afib with RVR: -Improved rate control s/p 1L IV fluids in the ED -As his volume continues to improve his rate is likely to improve as well -In the interim, could use Lopressor 25 mg q 6 hours with hold parameters if needed, otherwise continue Lopressor 25 mg bid -It appears he responds well to Cardizem CD 120 mg daily, continue at this time -CHADS2VASc at least 6 (CHF, age x 2, HTN, vascular dz, DM) -Xarelto 15 mg q dinner given his current CrCl of 29.54 mL/min   2. Chronic combined CHF: -He does not appear volume overloaded at this time. JVD is flat, lungs without crackles, cxr without edema or congestion -Lasix on hold given his Acute on chronic renal injury below -Continue Lopressor as above, consider changing to Toprol XL vs Coreg given his cardiomyopathy with EF of 35-40%, as long as he obtains adequate rate control with these  -If BP and renal function allow, consider either adding low-dose hydralazine to Imdur (once restarted), or low-dose ACEi/ARB vs Entresto. Though would need to be cautious given his history of renal injury, hypotension, and hyperkalemia  -Hold spironolactone at this time given his renal injury and hyperkalemia   3. Acute on CKD  stage II: -Improving s/p IV fluids as above -Will start low-dose IV infusion of normal saline of 75 mL/hr -Monitor -Likely multifactorial including prerenal with over diuresis vs ATN with hypotension  -Avoid nephrotoxic agents    4. Generalized weakness: -Likely multifactorial including his decreased PO intake/dehydration, acute on chronic renal disease, and hypotension -Check a TSH  5. CAD: -Never with any symptoms of angina -Prior cardiac cath in 2015, not available in [place] -Attempt to get record   6. History of orthostatic hypotension: -Check orthostatics -Has received 1L of IV saline in the ED  7. Hyperkalemia: -Improving s/p albuterol nebs  -Given his Afib with RVR, consider using Xopenex inhalers. This has the same efficacy as albuterol in reducing serum potassium levels with less adverse effect profile. Levoalbuterol is as effective as racemic albuterol in lowering serum potassium. Randomized controlled trial. Pancu D, et al. J Emerg Med. 2003.   Melvern Banker, PA-C Pager: [phone] 05/11/2015, 2:26 PM

## 2015-05-11 NOTE — ED Notes (Signed)
Pt sent from Dr. Doy Hutching office with c/o weakness, states pt had recent admission with CHF and feels pt may have been over diuresed and is concerned about renal failure and hyperkalemia, states pt came over yesterday and was in the [facility] and [facility].Marland Kitchen

## 2015-05-11 NOTE — ED Provider Notes (Signed)
[facility] Emergency Department Provider Note  ____________________________________________  Time seen: Approximately 10:10 AM  I have reviewed the triage vital signs and the nursing notes.   HISTORY  Chief Complaint Weakness    HPI Gregory Tyler is a 75 y.o. male patient does not seem to be completely sure why he is here but there is a can tell he was sent here because of worsening renal failure and increasing potassium. He comes with a set of labs from last few days.   Past Medical History  Diagnosis Date  . Prostate cancer ([place])   . Arthritis   . Diabetes mellitus without complication ([place])   . Hypertension   . CHF (congestive heart failure) (HCC)     Combined Systolic and Diastolic; EF 123456 in [id number]  . Emphysema lung ([place])   . Hyperlipidemia   . Gout   . Gastric ulcer   . Anemia   . Nephrolithiasis   . Coronary artery disease   . Anxiety   . Depression   . Chronic atrial fibrillation (HCC)     a. on Xarelto  . Shortness of breath dyspnea     Patient Active Problem List   Diagnosis Date Noted  . Acute renal failure (ARF) ([place]) 05/11/2015  . Atrial fibrillation with RVR ([place]) 05/11/2015  . Orthostatic hypotension 05/09/2015  . Congestive dilated cardiomyopathy ([place]) 05/09/2015  . Polypharmacy 04/27/2015  . Chronic atrial fibrillation ([place]) 04/25/2015  . Acute on chronic diastolic (congestive) heart failure ([place]) 04/25/2015  . Acute respiratory failure with hypoxia ([place]) 04/25/2015  . Persistent atrial fibrillation with rapid ventricular response ([place])   . Acute on chronic combined systolic and diastolic CHF (congestive heart failure) ([place])   . Acute pulmonary edema (HCC)   . Dyspnea   . Coronary artery disease involving native coronary artery of native heart without angina pectoris   . Essential hypertension     Past Surgical History  Procedure Laterality Date  . Cholecystectomy    . Appendectomy    . Gastric bypass    .  Hemorrhoid surgery    . Back surgery    . Pilonidal cyst      No current outpatient prescriptions on file.  Allergies Aspirin and Codeine sulfate  Family History  Problem Relation Age of Onset  . CAD Mother   . Stroke Mother   . Diabetes Other     Social History Social History  Substance Use Topics  . Smoking status: Former Research scientist (life sciences)  . Smokeless tobacco: Current User  . Alcohol Use: No    Review of Systems Constitutional: No fever/chills Eyes: No visual changes. ENT: No sore throat. Cardiovascular: Denies chest pain. Respiratory: Denies shortness of breath. Gastrointestinal: No abdominal pain.  No nausea, no vomiting.  No diarrhea.  No constipation. Genitourinary: Negative for dysuria. Musculoskeletal: Negative for back pain. Skin: Negative for rash. Neurological: Negative for headaches, focal weakness or numbness.  10-point ROS otherwise negative.  ____________________________________________   PHYSICAL EXAM:  VITAL SIGNS: ED Triage Vitals  Enc Vitals Group     BP 05/11/15 0915 136/89 mmHg     Pulse Rate 05/11/15 0915 115     Resp 05/11/15 0915 18     Temp 05/11/15 0915 97.6 F (36.4 C)     Temp Source 05/11/15 0915 Oral     SpO2 05/11/15 0915 100 %     Weight 05/11/15 0915 150 lb (68.04 kg)     Height 05/11/15 0915 5\' 8"  (1.727 m)  Head Cir --      Peak Flow --      Pain Score 05/11/15 0918 0     Pain Loc --      Pain Edu? --      Excl. in [place]? --     Constitutional: Alert and oriented. Well appearing and in no acute distress. Eyes: Conjunctivae are normal. PERRL. EOMI. Head: Atraumatic. Nose: No congestion/rhinnorhea. Mouth/Throat: Mucous membranes are moist.  Oropharynx non-erythematous. Neck: No stridor. Cardiovascular: Normal rate, regular rhythm. Grossly normal heart sounds.  Good peripheral circulation. Respiratory: Normal respiratory effort.  No retractions. Lungs CTAB. Gastrointestinal: Soft and nontender. No distention. No abdominal  bruits. No CVA tenderness. Musculoskeletal: No lower extremity tenderness nor edema.  No joint effusions. Neurologic:  Normal speech and language. No gross focal neurologic deficits are appreciated. No gait instability. Skin:  Skin is warm, dry and intact. No rash noted. Psychiatric: Mood and affect are normal. Speech and behavior are normal.  ____________________________________________   LABS (all labs ordered are listed, but only abnormal results are displayed)  Labs Reviewed  COMPREHENSIVE METABOLIC PANEL - Abnormal; Notable for the following:    Sodium 129 (*)    Potassium 5.7 (*)    Chloride 96 (*)    CO2 21 (*)    Glucose, Bld 202 (*)    BUN 62 (*)    Creatinine, Ser 2.19 (*)    Total Protein 8.5 (*)    Alkaline Phosphatase 132 (*)    Total Bilirubin 1.3 (*)    GFR calc non Af Amer 28 (*)    GFR calc Af Amer 32 (*)    All other components within normal limits  CBC WITH DIFFERENTIAL/PLATELET - Abnormal; Notable for the following:    WBC 12.8 (*)    RDW 15.5 (*)    Neutro Abs 9.7 (*)    All other components within normal limits  URINALYSIS COMPLETEWITH MICROSCOPIC ([facility] ONLY) - Abnormal; Notable for the following:    Color, Urine YELLOW (*)    APPearance CLOUDY (*)    Hgb urine dipstick 3+ (*)    Protein, ur 30 (*)    Bacteria, UA RARE (*)    Squamous Epithelial / LPF 0-5 (*)    All other components within normal limits  HEMOGLOBIN A1C - Abnormal; Notable for the following:    Hgb A1c MFr Bld 8.0 (*)    All other components within normal limits  GLUCOSE, CAPILLARY - Abnormal; Notable for the following:    Glucose-Capillary 155 (*)    All other components within normal limits  GLUCOSE, CAPILLARY - Abnormal; Notable for the following:    Glucose-Capillary 326 (*)    All other components within normal limits  TROPONIN I  TROPONIN I  TROPONIN I   ____________________________________________  EKG  EKG read and interpreted by me shows atrial fibrillation at a  rate of 110 normal axis there are some high peaked T waves in the precordial leads. ____________________________________________  RADIOLOGY   ____________________________________________   Cline Cools ____________________________________________   INITIAL IMPRESSION / ASSESSMENT AND PLAN / ED COURSE  Pertinent labs & imaging results that were available during my care of the patient were reviewed by me and considered in my medical decision making (see chart for details).   ____________________________________________   FINAL CLINICAL IMPRESSION(S) / ED DIAGNOSES  Final diagnoses:  Shortness of breath   final diagnosis also include hyperkalemia and renal failure.    Nena Polio, MD 05/11/15 [phone]

## 2015-05-11 NOTE — Progress Notes (Signed)
Skin checked Amy Dalton RN

## 2015-05-12 LAB — BASIC METABOLIC PANEL
Anion gap: 6 (ref 5–15)
BUN: 52 mg/dL — AB (ref 6–20)
CHLORIDE: 106 mmol/L (ref 101–111)
CO2: 22 mmol/L (ref 22–32)
Calcium: 9 mg/dL (ref 8.9–10.3)
Creatinine, Ser: 1.81 mg/dL — ABNORMAL HIGH (ref 0.61–1.24)
GFR calc Af Amer: 40 mL/min — ABNORMAL LOW (ref >60)
GFR calc non Af Amer: 35 mL/min — ABNORMAL LOW (ref >60)
GLUCOSE: 173 mg/dL — AB (ref 65–99)
POTASSIUM: 5.3 mmol/L — AB (ref 3.5–5.1)
Sodium: 134 mmol/L — ABNORMAL LOW (ref 135–145)

## 2015-05-12 LAB — GLUCOSE, CAPILLARY
GLUCOSE-CAPILLARY: 141 mg/dL — AB (ref 65–99)
GLUCOSE-CAPILLARY: 185 mg/dL — AB (ref 65–99)
Glucose-Capillary: 187 mg/dL — ABNORMAL HIGH (ref 65–99)
Glucose-Capillary: 163 mg/dL — ABNORMAL HIGH (ref 65–99)

## 2015-05-12 LAB — CBC
HCT: 38.8 % — ABNORMAL LOW (ref 40.0–52.0)
HEMOGLOBIN: 13 g/dL (ref 13.0–18.0)
MCH: 29 pg (ref 26.0–34.0)
MCHC: 33.5 g/dL (ref 32.0–36.0)
MCV: 86.6 fL (ref 80.0–100.0)
Platelets: 203 10*3/uL (ref 150–440)
RBC: 4.48 MIL/uL (ref 4.40–5.90)
RDW: 15.6 % — ABNORMAL HIGH (ref 11.5–14.5)
WBC: 10.7 10*3/uL — ABNORMAL HIGH (ref 3.8–10.6)

## 2015-05-12 LAB — TSH: TSH: 1.925 u[IU]/mL (ref 0.350–4.500)

## 2015-05-12 LAB — TROPONIN I: TROPONIN I: 0.03 ng/mL (ref <0.031)

## 2015-05-12 NOTE — Progress Notes (Signed)
[facility]  Patient Status: active  [facility] is providing the following services: SN  If patient discharges after hours, please call [phone].   Gregory Tyler 05/12/2015, 11:16 AM

## 2015-05-12 NOTE — Progress Notes (Signed)
Inpatient Diabetes Program Recommendations  AACE/ADA: New Consensus Statement on Inpatient Glycemic Control (2015)  Target Ranges:  Prepandial:   less than 140 mg/dL      Peak postprandial:   less than 180 mg/dL (1-2 hours)      Critically ill patients:  140 - 180 mg/dL  Results for SONY, HINCH (MRN [id number]) as of 05/12/2015 08:09  Ref. Range 05/11/2015 12:30 05/11/2015 16:06 05/11/2015 20:42 05/12/2015 07:21  Glucose-Capillary Latest Ref Range: 65-99 mg/dL 155 (H) 326 (H) 168 (H) 141 (H)   Review of Glycemic Control  Diabetes history: DM2 Outpatient Diabetes medications: Metformin 500 mg BID Current orders for Inpatient glycemic control: Novolog 0-9 units TID with meals, Novolog 0-5 units HS  Inpatient Diabetes Program Recommendations: Insulin - Meal Coverage: Please consider ordering Novolog 4 units TID with meals for meal coverage (in addition to Novolog correction).  Thanks, Barnie Alderman, RN, MSN, CDE Diabetes Coordinator Inpatient Diabetes Program [phone] (Team Pager from [place] to [place]) [phone] (AP office) [phone] [facility] office) [phone] [facility] office)

## 2015-05-12 NOTE — Progress Notes (Signed)
[place] [facility] [place] NAME: Gregory Tyler    MR#:  [id number]  DATE OF BIRTH:  [birth year]  SUBJECTIVE:   Patient here due to weakness and noted to be in acute on chronic renal failure with hyperkalemia. Also noted to be in atrial fibrillation with rapid ventricular response and heart rate improved now.   REVIEW OF SYSTEMS:    Review of Systems  Constitutional: Negative for fever and chills.  HENT: Negative for congestion and tinnitus.   Eyes: Negative for blurred vision and double vision.  Respiratory: Negative for cough, shortness of breath and wheezing.   Cardiovascular: Negative for chest pain, orthopnea and PND.  Gastrointestinal: Negative for nausea, vomiting, abdominal pain and diarrhea.  Genitourinary: Negative for dysuria and hematuria.  Neurological: Negative for dizziness, sensory change and focal weakness.  All other systems reviewed and are negative.   Nutrition: Heart Healthy Tolerating Diet: Yes Tolerating PT: Await Eval.    DRUG ALLERGIES:   Allergies  Allergen Reactions  . Aspirin     GI Bleeding  . Codeine Sulfate     Other reaction(s): Other (See Comments) BLISTERS    VITALS:  Blood pressure 142/77, pulse 96, temperature 98.1 F (36.7 C), temperature source Oral, resp. rate 16, height 5\' 9"  (1.753 m), weight 70.58 kg (155 lb 9.6 oz), SpO2 99 %.  PHYSICAL EXAMINATION:   Physical Exam  GENERAL:  75 y.o.-year-old patient lying in the bed with no acute distress.  EYES: Pupils equal, round, reactive to light and accommodation. No scleral icterus. Extraocular muscles intact.  HEENT: Head atraumatic, normocephalic. Oropharynx and nasopharynx clear.  NECK:  Supple, no jugular venous distention. No thyroid enlargement, no tenderness.  LUNGS: Normal breath sounds bilaterally, no wheezing, rales, rhonchi. No use of accessory muscles of respiration.  CARDIOVASCULAR: S1, S2 Irregular. No murmurs, rubs, or  gallops.  ABDOMEN: Soft, nontender, nondistended. Bowel sounds present. No organomegaly or mass.  EXTREMITIES: No cyanosis, clubbing or edema b/l.    NEUROLOGIC: Cranial nerves II through XII are intact. No focal Motor or sensory deficits b/l.   PSYCHIATRIC: The patient is alert and oriented x 3. Good affect.  SKIN: No obvious rash, lesion, or ulcer.    LABORATORY PANEL:   CBC  Recent Labs Lab 05/12/15 0425  WBC 10.7*  HGB 13.0  HCT 38.8*  PLT 203   ------------------------------------------------------------------------------------------------------------------  Chemistries   Recent Labs Lab 05/11/15 0922 05/12/15 0425  NA 129* 134*  K 5.7* 5.3*  CL 96* 106  CO2 21* 22  GLUCOSE 202* 173*  BUN 62* 52*  CREATININE 2.19* 1.81*  CALCIUM 9.9 9.0  AST 41  --   ALT 48  --   ALKPHOS 132*  --   BILITOT 1.3*  --    ------------------------------------------------------------------------------------------------------------------  Cardiac Enzymes  Recent Labs Lab 05/12/15 0013  TROPONINI 0.03   ------------------------------------------------------------------------------------------------------------------  RADIOLOGY:  Dg Chest 2 View  05/11/2015  CLINICAL DATA:  Chronic atrial fibrillation. Apneic episode 3 weeks prior EXAM: CHEST  2 VIEW COMPARISON:  April 25, 2015 FINDINGS: There is no edema or consolidation. There are apparent nipple shadows bilaterally. Heart size and pulmonary vascularity are normal. No adenopathy. There is degenerative change in the thoracic spine. There are multiple surgical clips in the upper abdomen. IMPRESSION: No edema or consolidation. Electronically Signed   By: Lowella Grip III M.D.   On: 05/11/2015 12:03     ASSESSMENT AND PLAN:   75 year old male with past medical  history of prostate cancer, diabetes type 2 without complication, osteoarthritis, hypertension, history of CHF, hyperlipidemia, gout, coronary artery disease,  anxiety/depression, who presented to the hospital due to weakness and noted to be in acute on chronic renal failure and also noted to be in atrial fibrillation with RVR.  1. Atrial fibrillation with rapid ventricular rate: HR much improved today.  - cont. Oral Cardizem and PRN IV cardizem as needed.  - appreciate Cardiology input and no further adjustment of meds at this time.  - cont. Xarelto.   2. Acute renal failure: Likely due to diuresis during previous admission and decreased oral intake since discharge.  - Continue gentle hydration and Cr. Is improving and will monitor.  - Hold nephrotoxins including metformin and diuretics at this time.  3. Hyperkalemia: due to ARF.  - improving and will cont. To monitor. No ECG changes.   4. Hyponatremia: Likely due to decreased oral intake.  - improved w/ IV fluid hydration and will monitor.   5. Congestive heart failure: hx of chronic systolic CHF. Ejection fraction 35-40%. No exacerbation is suspected at this time.  - hold Lasix due to ARF.  Cont. B-blocker, Imdur.    6. COPD:  Continue Symbicort - no acute exacerbation.   7. Coronary artery disease: no acute chest pain.  - enzymes X 3 have been (-). Cont. Xarelto, B-blocker, Statin.   8. Diabetes mellitus type 2: Hold metformin at this time. Cont. SSI.  - follow BS.      All the records are reviewed and case discussed with Care Management/Social Workerr. Management plans discussed with the patient, family and they are in agreement.  CODE STATUS: Full  DVT Prophylaxis: Xarelto  TOTAL TIME TAKING CARE OF THIS PATIENT: 30 minutes.   POSSIBLE D/C IN 1-2 DAYS, DEPENDING ON CLINICAL CONDITION.   Henreitta Leber M.D on 05/12/2015 at 4:12 PM  Between 7am to 6pm - Pager - [phone]  After 6pm go to [email or web address] - password [email or web address] [facility]  Office  [phone]  CC: Primary care physician; [name], MD

## 2015-05-12 NOTE — Progress Notes (Signed)
Patient: Gregory Tyler / Admit Date: 05/11/2015 / Date of Encounter: 05/12/2015, 8:21 AM   Subjective: No acute events overnight. Has not ambulated. With gentle IV fluid hydration renal function has improved from 2/19-->1.81. K+ improving from 5.7-->5.3. Na improving from 129-->134. BP stable in the low [id number] systolic.   Review of Systems: Review of Systems  Constitutional: Positive for weight loss and malaise/fatigue. Negative for fever, chills and diaphoresis.  HENT: Negative for congestion.   Eyes: Negative for discharge and redness.  Respiratory: Positive for cough, shortness of breath and wheezing. Negative for hemoptysis and sputum production.   Cardiovascular: Negative for chest pain, palpitations, orthopnea, claudication, leg swelling and PND.  Gastrointestinal: Negative for nausea, vomiting and abdominal pain.  Musculoskeletal: Negative for falls.  Skin: Negative for rash.  Neurological: Positive for dizziness and weakness. Negative for tingling, tremors, sensory change, speech change, focal weakness and loss of consciousness.  Endo/Heme/Allergies: Does not bruise/bleed easily.  Psychiatric/Behavioral: The patient is not nervous/anxious.      Objective: Telemetry: Afib, 60's at rest, 1-teens while eating breakfast Physical Exam: Blood pressure 109/77, pulse 96, temperature 98.1 F (36.7 C), temperature source Oral, resp. rate 18, height 5\' 9"  (1.753 m), weight 155 lb 9.6 oz (70.58 kg), SpO2 97 %. Body mass index is 22.97 kg/(m^2). General: Well developed, well nourished, in no acute distress. Head: Normocephalic, atraumatic, sclera non-icteric, no xanthomas, nares are without discharge. Neck: Negative for carotid bruits. JVP not elevated. Lungs: Clear bilaterally to auscultation without wheezes, rales, or rhonchi. Breathing is unlabored. Heart: Irregularly-irregular, S1 S2 without murmurs, rubs, or gallops.  Abdomen: Soft, non-tender, non-distended with normoactive bowel  sounds. No rebound/guarding. Extremities: No clubbing or cyanosis. No edema. Distal pedal pulses are 2+ and equal bilaterally. Neuro: Alert and oriented X 3. Moves all extremities spontaneously. Psych:  Responds to questions appropriately with a normal affect. Poor insight into his medical conditions.    Intake/Output Summary (Last 24 hours) at 05/12/15 0821 Last data filed at 05/12/15 0700  Gross per 24 hour  Intake   1365 ml  Output   2700 ml  Net  -1335 ml    Inpatient Medications:  . budesonide-formoterol  2 puff Inhalation BID  . diltiazem  120 mg Oral Daily  . insulin aspart  0-5 Units Subcutaneous QHS  . insulin aspart  0-9 Units Subcutaneous TID WC  . isosorbide mononitrate  30 mg Oral Daily  . magnesium oxide  400 mg Oral Daily  . metoprolol tartrate  25 mg Oral BID  . Rivaroxaban  15 mg Oral Daily  . rosuvastatin  20 mg Oral q1800  . sodium chloride  3 mL Intravenous Q12H   Infusions:  . sodium chloride 75 mL/hr at 05/12/15 0651    Labs:  Recent Labs  05/11/15 0922 05/12/15 0425  NA 129* 134*  K 5.7* 5.3*  CL 96* 106  CO2 21* 22  GLUCOSE 202* 173*  BUN 62* 52*  CREATININE 2.19* 1.81*  CALCIUM 9.9 9.0    Recent Labs  05/11/15 0922  AST 41  ALT 48  ALKPHOS 132*  BILITOT 1.3*  PROT 8.5*  ALBUMIN 4.4    Recent Labs  05/11/15 0922 05/12/15 0425  WBC 12.8* 10.7*  NEUTROABS 9.7*  --   HGB 15.3 13.0  HCT 46.9 38.8*  MCV 86.1 86.6  PLT 239 203    Recent Labs  05/11/15 1320 05/11/15 1916 05/12/15 0013  TROPONINI 0.03 0.03 0.03   Invalid input(s): POCBNP  Recent  Labs  05/11/15 1320  HGBA1C 8.0*     Weights: Filed Weights   05/11/15 0915 05/11/15 1317 05/12/15 0500  Weight: 150 lb (68.04 kg) 158 lb 4.8 oz (71.804 kg) 155 lb 9.6 oz (70.58 kg)     Radiology/Studies:  Dg Chest 2 View  05/11/2015  CLINICAL DATA:  Chronic atrial fibrillation. Apneic episode 3 weeks prior EXAM: CHEST  2 VIEW COMPARISON:  April 25, 2015 FINDINGS:  There is no edema or consolidation. There are apparent nipple shadows bilaterally. Heart size and pulmonary vascularity are normal. No adenopathy. There is degenerative change in the thoracic spine. There are multiple surgical clips in the upper abdomen. IMPRESSION: No edema or consolidation. Electronically Signed   By: Lowella Grip III M.D.   On: 05/11/2015 12:03   Dg Chest Portable 1 View  04/25/2015  CLINICAL DATA:  Shortness of breath. EXAM: PORTABLE CHEST 1 VIEW COMPARISON:  02/06/2015 FINDINGS: The heart is enlarged. Bilateral perihilar opacities and indistinct pulmonary vasculature concerning for pulmonary edema, right greater than left. No confluent airspace disease, large pleural effusion or pneumothorax. IMPRESSION: Cardiomegaly and pulmonary edema, most consistent with CHF. Electronically Signed   By: Jeb Levering M.D.   On: 04/25/2015 01:05     Assessment and Plan   1. Chronic Afib with RVR: -Improved rate control s/p IV fluids, though with minimal movement in the bed he becomes tachycardic. Suspect his movement around the house with ambulation is also leading to further increased tachycardia, thus playing a role into his SOB. Cannot rule out a pulmonary component as well as he does have a long smoking history  -As his volume continues to improve his rate is likely to improve as well -In the interim, continue Lopressor 25 mg q 6 hours with hold parameters if needed, otherwise continue Lopressor 25 mg bid -It appears he responds well to Cardizem CD 120 mg daily, continue at this time -His renal function preclude the usage of digoxin for rate control  -CHADS2VASc at least 6 (CHF, age x 2, HTN, vascular dz, DM) -Xarelto 15 mg q dinner given his current CrCl of 29.54 mL/min  -Perhaps it would be beneficial to try and get him out of Afib with a DCCV -He was started on Xarelto on 04/25/15. He feels like he has not missed any doses of this medication, though he later tells me he cannot be  be [id number] certain if he has missed any doses stating "it is possible I may have missed a dose or two." He would need a full 3 weeks of uninterrupted anticoagulation or a TEE/DCCV  2. Chronic combined CHF: -He does not appear volume overloaded at this time. JVD is flat, lungs without crackles, cxr without edema or congestion -Lasix on hold given his Acute on chronic renal injury below -Continue Lopressor as above, consider changing to Toprol XL vs Coreg given his cardiomyopathy with EF of 35-40%, as long as he obtains adequate rate control with these  -If BP and renal function allow, consider either adding low-dose hydralazine to Imdur (once restarted), or low-dose ACEi/ARB vs Entresto. Though would need to be cautious given his history of renal injury, hypotension, and hyperkalemia  -Hold spironolactone at this time given his renal injury and hyperkalemia   3. Acute on CKD stage II: -Improving s/p IV fluids as above -Continue low-dose IV infusion of normal saline of 75 mL/hr -Monitor -Likely multifactorial including prerenal with over diuresis vs ATN with hypotension  -Avoid nephrotoxic agents   4. Generalized  weakness: -Likely multifactorial including his decreased PO intake/dehydration, acute on chronic renal disease, and hypotension -Check a TSH  5. CAD: -Never with any symptoms of angina -Prior cardiac cath in 2015, not available in [place] -Attempt to get record   6. History of orthostatic hypotension: -Check orthostatics -As above  7. Hyperkalemia: -Improving s/p albuterol nebs  -Given his Afib with RVR, consider using Xopenex inhalers. This has the same efficacy as albuterol in reducing serum potassium levels with less adverse effect profile. Levoalbuterol is as effective as racemic albuterol in lowering serum potassium. Randomized controlled trial. Pancu D, et al. J Emerg Med. 2003.    Melvern Banker, PA-C Pager: [phone] 05/12/2015, 8:21 AM

## 2015-05-13 LAB — BASIC METABOLIC PANEL
Anion gap: 5 (ref 5–15)
BUN: 37 mg/dL — ABNORMAL HIGH (ref 6–20)
CHLORIDE: 107 mmol/L (ref 101–111)
CO2: 21 mmol/L — ABNORMAL LOW (ref 22–32)
CREATININE: 1.63 mg/dL — AB (ref 0.61–1.24)
Calcium: 8.7 mg/dL — ABNORMAL LOW (ref 8.9–10.3)
GFR calc non Af Amer: 40 mL/min — ABNORMAL LOW (ref >60)
GFR, EST AFRICAN AMERICAN: 46 mL/min — AB (ref >60)
Glucose, Bld: 149 mg/dL — ABNORMAL HIGH (ref 65–99)
POTASSIUM: 4.8 mmol/L (ref 3.5–5.1)
SODIUM: 133 mmol/L — AB (ref 135–145)

## 2015-05-13 LAB — GLUCOSE, CAPILLARY: Glucose-Capillary: 120 mg/dL — ABNORMAL HIGH (ref 65–99)

## 2015-05-13 MED ORDER — FUROSEMIDE 20 MG PO TABS: 20.0000 mg | ORAL_TABLET | Freq: Every day | ORAL | Status: DC | PRN

## 2015-05-13 NOTE — Care Management Note (Signed)
Case Management Note  Patient Details  Name: Gregory Tyler MRN: [id number] Date of Birth: Feb 08, 1941  Subjective/Objective:     [name] is an active patient of [facility]. Call to [facility] per discharge home today and to resume home health services. [name] has Medicare and a prescription for a new glucometer per his report that his old glucometer is not working well.                Action/Plan:   Expected Discharge Date:                  Expected Discharge Plan:     In-House Referral:     Discharge planning Services     Post Acute Care Choice:    Choice offered to:     DME Arranged:    DME Agency:     HH Arranged:    [place] Agency:     Status of Service:     Medicare Important Message Given:  Yes Date Medicare IM Given:    Medicare IM give by:    Date Additional Medicare IM Given:    Additional Medicare Important Message give by:     If discussed at [place] of Stay Meetings, dates discussed:    Additional Comments:  Meliya Mcconahy A, RN 05/13/2015, 8:38 AM

## 2015-05-13 NOTE — Progress Notes (Signed)
A [facility]. IV and tele removed. Discharge instrctions given. Son to take home. No isse

## 2015-05-13 NOTE — Discharge Summary (Signed)
[facility] NAME: Gregory Tyler    MR#:  [id number]  DATE OF BIRTH:  March 16, 1941  DATE OF ADMISSION:  05/11/2015 ADMITTING PHYSICIAN: Aldean Jewett, MD  DATE OF DISCHARGE: 05/13/2015  9:55 AM  PRIMARY CARE PHYSICIAN: SPARKS,JEFFREY D, MD    ADMISSION DIAGNOSIS:  Shortness of breath [R06.02]  DISCHARGE DIAGNOSIS:  Active Problems:   Acute renal failure (ARF) (HCC)   Atrial fibrillation with RVR (HCC)   Shortness of breath   Hyperkalemia   Dehydration   Cardiomyopathy ([place])   SECONDARY DIAGNOSIS:   Past Medical History  Diagnosis Date  . Prostate cancer ([place])   . Arthritis   . Diabetes mellitus without complication ([place])   . Hypertension   . CHF (congestive heart failure) (HCC)     Combined Systolic and Diastolic; EF 123456 in [id number]  . Emphysema lung ([place])   . Hyperlipidemia   . Gout   . Gastric ulcer   . Anemia   . Nephrolithiasis   . Coronary artery disease   . Anxiety   . Depression   . Chronic atrial fibrillation (HCC)     a. on Xarelto  . Shortness of breath dyspnea     HOSPITAL COURSE:   75 year old male with past medical history of prostate cancer, diabetes type 2 without complication, osteoarthritis, hypertension, history of CHF, hyperlipidemia, gout, coronary artery disease, anxiety/depression, who presented to the hospital due to weakness and noted to be in acute on chronic renal failure and also noted to be in atrial fibrillation with RVR.  1. Atrial fibrillation with rapid ventricular rate: Pt. Was given his home dose of Cardizem and his HR improved.   - appreciate Cardiology input and no further adjustment of meds at this time.  - he will cont. Xarelto.   2. Acute renal failure: Likely due to diuresis during previous admission and decreased oral intake since discharge.  -Patient was given some gentle IV fluid hydration and his BUN/creatinine is improved and is back to  baseline. -At this point patient is being discharged only on diuretics as needed  3. Hyperkalemia: This was due to ARF.  -It has improved and resolved since admission and as his renal function is improved.  4. Hyponatremia: Likely due to decreased oral intake.  -This is also improved and resolved with IV fluid hydration.  5. Congestive heart failure: hx of chronic systolic CHF. Ejection fraction 35-40%. No exacerbation while in the hospital - he will Cont. B-blocker, Imdur and he is also being discharged on Lasix PRN swelling/weight gain.    6. COPD: he will Continue Symbicort - no acute exacerbation.   7. Coronary artery disease: no acute chest pain.  - enzymes X 3 have been (-). He will Cont. Xarelto, B-blocker, Statin.   8. Diabetes mellitus type 2: His metformin was held while in hospital but since his renal function has improved he will resume it upon discharge. He was on SSI while in the hospital.   DISCHARGE CONDITIONS:   Stable  CONSULTS OBTAINED:  Treatment Team:  Minna Merritts, MD  DRUG ALLERGIES:   Allergies  Allergen Reactions  . Aspirin     GI Bleeding  . Codeine Sulfate     Other reaction(s): Other (See Comments) BLISTERS    DISCHARGE MEDICATIONS:   Discharge Medication List as of 05/13/2015  9:36 AM    START taking these medications   Details  furosemide (LASIX) 20 MG tablet Take 1  tablet (20 mg total) by mouth daily as needed for fluid or edema (Weight gain.)., Starting 05/13/2015, Until Discontinued, Print      CONTINUE these medications which have NOT CHANGED   Details  allopurinol (ZYLOPRIM) 300 MG tablet Take 300 mg by mouth daily. , Until Discontinued, Historical Med    budesonide-formoterol (SYMBICORT) 160-4.5 MCG/ACT inhaler Inhale 2 puffs into the lungs 2 (two) times daily., Until Discontinued, Historical Med    diltiazem (CARDIZEM CD) 120 MG 24 hr capsule Take 1 capsule (120 mg total) by mouth daily., Starting 04/27/2015, Until  Discontinued, Print    HYDROcodone-acetaminophen (NORCO/VICODIN) 5-325 MG per tablet Take 1 tablet by mouth every 4 (four) hours as needed for moderate pain. , Until Discontinued, Historical Med    isosorbide mononitrate (IMDUR) 30 MG 24 hr tablet Take 30 mg by mouth daily., Until Discontinued, Historical Med    magnesium oxide (MAG-OX) 400 MG tablet Take 400 mg by mouth daily. , Until Discontinued, Historical Med    metFORMIN (GLUCOPHAGE) 500 MG tablet Take 500 mg by mouth 2 (two) times daily. , Until Discontinued, Historical Med    metoprolol tartrate (LOPRESSOR) 25 MG tablet Take 25 mg by mouth 2 (two) times daily. , Until Discontinued, Historical Med    Rivaroxaban (XARELTO) 15 MG TABS tablet Take 1 tablet (15 mg total) by mouth daily., Starting 04/27/2015, Until Discontinued, Print    rosuvastatin (CRESTOR) 20 MG tablet Take 1 tablet (20 mg total) by mouth daily at 6 PM., Starting 04/27/2015, Until Discontinued, Print         DISCHARGE INSTRUCTIONS:   DIET:  Cardiac diet and Diabetic diet  DISCHARGE CONDITION:  Stable  ACTIVITY:  Activity as tolerated  OXYGEN:  Home Oxygen: No.   Oxygen Delivery: room air  DISCHARGE LOCATION:  Home with home health nursing, PT   If you experience worsening of your admission symptoms, develop shortness of breath, life threatening emergency, suicidal or homicidal thoughts you must seek medical attention immediately by calling 911 or calling your MD immediately  if symptoms less severe.  You Must read complete instructions/literature along with all the possible adverse reactions/side effects for all the Medicines you take and that have been prescribed to you. Take any new Medicines after you have completely understood and accpet all the possible adverse reactions/side effects.   Please note  You were cared for by a hospitalist during your hospital stay. If you have any questions about your discharge medications or the care you received  while you were in the hospital after you are discharged, you can call the unit and asked to speak with the hospitalist on call if the hospitalist that took care of you is not available. Once you are discharged, your primary care physician will handle any further medical issues. Please note that NO REFILLS for any discharge medications will be authorized once you are discharged, as it is imperative that you return to your primary care physician (or establish a relationship with a primary care physician if you do not have one) for your aftercare needs so that they can reassess your need for medications and monitor your lab values.     Today   Overall feels better. Renal function improved. Potassium level is normal. He ambulated and did not feel dizzy or lightheaded.  VITAL SIGNS:  Blood pressure 121/84, pulse 93, temperature 97.8 F (36.6 C), temperature source Oral, resp. rate 18, height 5\' 9"  (1.753 m), weight 71.532 kg (157 lb 11.2 oz), SpO2 93 %.  I/O:   Intake/Output Summary (Last 24 hours) at 05/13/15 1606 Last data filed at 05/13/15 0756  Gross per 24 hour  Intake 2003.75 ml  Output   2750 ml  Net -746.25 ml    PHYSICAL EXAMINATION:   GENERAL: 75 y.o.-year-old patient lying in the bed with no acute distress.  EYES: Pupils equal, round, reactive to light and accommodation. No scleral icterus. Extraocular muscles intact.  HEENT: Head atraumatic, normocephalic. Oropharynx and nasopharynx clear.  NECK: Supple, no jugular venous distention. No thyroid enlargement, no tenderness.  LUNGS: Normal breath sounds bilaterally, no wheezing, rales, rhonchi. No use of accessory muscles of respiration.  CARDIOVASCULAR: S1, S2 Irregular. No murmurs, rubs, or gallops.  ABDOMEN: Soft, nontender, nondistended. Bowel sounds present. No organomegaly or mass.  EXTREMITIES: No cyanosis, clubbing or edema b/l.  NEUROLOGIC: Cranial nerves II through XII are intact. No focal Motor or sensory  deficits b/l.  PSYCHIATRIC: The patient is alert and oriented x 3. Good affect.  SKIN: No obvious rash, lesion, or ulcer.   DATA REVIEW:   CBC  Recent Labs Lab 05/12/15 0425  WBC 10.7*  HGB 13.0  HCT 38.8*  PLT 203    Chemistries   Recent Labs Lab 05/11/15 0922  05/13/15 0426  NA 129*  < > 133*  K 5.7*  < > 4.8  CL 96*  < > 107  CO2 21*  < > 21*  GLUCOSE 202*  < > 149*  BUN 62*  < > 37*  CREATININE 2.19*  < > 1.63*  CALCIUM 9.9  < > 8.7*  AST 41  --   --   ALT 48  --   --   ALKPHOS 132*  --   --   BILITOT 1.3*  --   --   < > = values in this interval not displayed.  Cardiac Enzymes  Recent Labs Lab 05/12/15 0013  TROPONINI 0.03    Microbiology Results  Results for orders placed or performed during the hospital encounter of 04/24/15  MRSA PCR Screening     Status: None   Collection Time: 04/25/15  2:40 AM  Result Value Ref Range Status   MRSA by PCR NEGATIVE NEGATIVE Final    Comment:        The GeneXpert MRSA Assay (FDA approved for NASAL specimens only), is one component of a comprehensive MRSA colonization surveillance program. It is not intended to diagnose MRSA infection nor to guide or monitor treatment for MRSA infections.     RADIOLOGY:  No results found.    Management plans discussed with the patient, family and they are in agreement.  CODE STATUS:  Code Status History    Date Active Date Inactive Code Status Order ID Comments User Context   05/11/2015  1:07 PM 05/13/2015 12:56 PM Full Code [id number]  Aldean Jewett, MD Inpatient   04/25/2015  1:49 AM 04/27/2015  5:41 PM Full Code [id number]  Hillary Bow, MD ED      TOTAL TIME TAKING CARE OF THIS PATIENT: 40 minutes.    Henreitta Leber M.D on 05/13/2015 at 4:06 PM  Between 7am to 6pm - Pager - [phone]  After 6pm go to [email or web address] - password [email or web address] [facility]  Office  [phone]  CC: Primary care physician; [name], MD

## 2015-05-13 NOTE — Care Management Important Message (Signed)
Important Message  Patient Details  Name: Gregory Tyler MRN: [id number] Date of Birth: [birth year]   Medicare Important Message Given:  Yes    Naina Sleeper A, RN 05/13/2015, 8:03 AM

## 2015-05-19 ENCOUNTER — Ambulatory Visit: Payer: Medicare Other | Attending: Family | Admitting: Family

## 2015-05-19 ENCOUNTER — Encounter: Payer: Self-pay | Admitting: Family

## 2015-05-19 VITALS — BP 86/55 | HR 80 | Resp 20 | Ht 68.0 in | Wt 161.0 lb

## 2015-05-19 DIAGNOSIS — F329 Major depressive disorder, single episode, unspecified: Secondary | ICD-10-CM | POA: Diagnosis not present

## 2015-05-19 DIAGNOSIS — Z7901 Long term (current) use of anticoagulants: Secondary | ICD-10-CM | POA: Diagnosis not present

## 2015-05-19 DIAGNOSIS — Z7984 Long term (current) use of oral hypoglycemic drugs: Secondary | ICD-10-CM | POA: Insufficient documentation

## 2015-05-19 DIAGNOSIS — E785 Hyperlipidemia, unspecified: Secondary | ICD-10-CM | POA: Insufficient documentation

## 2015-05-19 DIAGNOSIS — I951 Orthostatic hypotension: Secondary | ICD-10-CM

## 2015-05-19 DIAGNOSIS — Z886 Allergy status to analgesic agent status: Secondary | ICD-10-CM | POA: Diagnosis not present

## 2015-05-19 DIAGNOSIS — Z8249 Family history of ischemic heart disease and other diseases of the circulatory system: Secondary | ICD-10-CM | POA: Diagnosis not present

## 2015-05-19 DIAGNOSIS — I10 Essential (primary) hypertension: Secondary | ICD-10-CM | POA: Diagnosis not present

## 2015-05-19 DIAGNOSIS — Z87891 Personal history of nicotine dependence: Secondary | ICD-10-CM | POA: Insufficient documentation

## 2015-05-19 DIAGNOSIS — Z87442 Personal history of urinary calculi: Secondary | ICD-10-CM | POA: Diagnosis not present

## 2015-05-19 DIAGNOSIS — I2510 Atherosclerotic heart disease of native coronary artery without angina pectoris: Secondary | ICD-10-CM | POA: Diagnosis not present

## 2015-05-19 DIAGNOSIS — I482 Chronic atrial fibrillation, unspecified: Secondary | ICD-10-CM

## 2015-05-19 DIAGNOSIS — Z8546 Personal history of malignant neoplasm of prostate: Secondary | ICD-10-CM | POA: Diagnosis not present

## 2015-05-19 DIAGNOSIS — Z885 Allergy status to narcotic agent status: Secondary | ICD-10-CM | POA: Insufficient documentation

## 2015-05-19 DIAGNOSIS — M1990 Unspecified osteoarthritis, unspecified site: Secondary | ICD-10-CM | POA: Insufficient documentation

## 2015-05-19 DIAGNOSIS — J439 Emphysema, unspecified: Secondary | ICD-10-CM | POA: Insufficient documentation

## 2015-05-19 DIAGNOSIS — M109 Gout, unspecified: Secondary | ICD-10-CM | POA: Insufficient documentation

## 2015-05-19 DIAGNOSIS — I959 Hypotension, unspecified: Secondary | ICD-10-CM | POA: Diagnosis not present

## 2015-05-19 DIAGNOSIS — R42 Dizziness and giddiness: Secondary | ICD-10-CM | POA: Diagnosis not present

## 2015-05-19 DIAGNOSIS — Z79899 Other long term (current) drug therapy: Secondary | ICD-10-CM | POA: Diagnosis not present

## 2015-05-19 DIAGNOSIS — I5022 Chronic systolic (congestive) heart failure: Secondary | ICD-10-CM | POA: Insufficient documentation

## 2015-05-19 DIAGNOSIS — E119 Type 2 diabetes mellitus without complications: Secondary | ICD-10-CM | POA: Diagnosis not present

## 2015-05-19 DIAGNOSIS — Z8719 Personal history of other diseases of the digestive system: Secondary | ICD-10-CM | POA: Insufficient documentation

## 2015-05-19 NOTE — Progress Notes (Signed)
Subjective:    Patient ID: Gregory Tyler, male    DOB: 04/23/1941, 75 y.o.   MRN: [id number]  Congestive Heart Failure Presents for initial visit. The disease course has been stable. Associated symptoms include fatigue and shortness of breath. Pertinent negatives include no abdominal pain, chest pain, edema, orthopnea or palpitations. The symptoms have been stable. Past treatments include beta blockers and salt and fluid restriction. The treatment provided moderate relief. Compliance with prior treatments has been good. His past medical history is significant for anemia, arrhythmia, CAD, chronic lung disease, DM and HTN. He has one 1st degree relative with heart disease. Compliance with total regimen is 76-100%.  Atrial Fibrillation Presents for initial visit. Symptoms include dizziness, hypotension and shortness of breath. Symptoms are negative for bradycardia, chest pain and palpitations. The symptoms have been stable. Past treatments include beta blockers, Ca channel blockers and anticoagulant. Compliance with prior treatments has been good. Past medical history includes atrial fibrillation, CAD, CHF, HTN and hyperlipidemia.    Past Medical History  Diagnosis Date  . Prostate cancer ([place])   . Arthritis   . Diabetes mellitus without complication ([place])   . Hypertension   . CHF (congestive heart failure) (HCC)     Combined Systolic and Diastolic; EF 123456 in [id number]  . Emphysema lung ([place])   . Hyperlipidemia   . Gout   . Gastric ulcer   . Anemia   . Nephrolithiasis   . Coronary artery disease   . Anxiety   . Depression   . Chronic atrial fibrillation (HCC)     a. on Xarelto  . Shortness of breath dyspnea     Past Surgical History  Procedure Laterality Date  . Cholecystectomy    . Appendectomy    . Gastric bypass    . Hemorrhoid surgery    . Back surgery    . Pilonidal cyst      Family History  Problem Relation Age of Onset  . CAD Mother   . Stroke Mother   .  Diabetes Other     Social History  Substance Use Topics  . Smoking status: Former Research scientist (life sciences)  . Smokeless tobacco: Current User  . Alcohol Use: No    Allergies  Allergen Reactions  . Aspirin     GI Bleeding  . Codeine Sulfate     Other reaction(s): Other (See Comments) BLISTERS    Prior to Admission medications   Medication Sig Start Date End Date Taking? Authorizing Provider  allopurinol (ZYLOPRIM) 300 MG tablet Take 300 mg by mouth daily.    Yes Historical Provider, MD  budesonide-formoterol (SYMBICORT) 160-4.5 MCG/ACT inhaler Inhale 2 puffs into the lungs 2 (two) times daily.   Yes Historical Provider, MD  diltiazem (CARDIZEM CD) 120 MG 24 hr capsule Take 1 capsule (120 mg total) by mouth daily. 04/27/15  Yes Henreitta Leber, MD  furosemide (LASIX) 20 MG tablet Take 1 tablet (20 mg total) by mouth daily as needed for fluid or edema (Weight gain.). 05/13/15  Yes Henreitta Leber, MD  HYDROcodone-acetaminophen (NORCO/VICODIN) 5-325 MG per tablet Take 1 tablet by mouth every 4 (four) hours as needed for moderate pain.    Yes Historical Provider, MD  isosorbide mononitrate (IMDUR) 30 MG 24 hr tablet Take 30 mg by mouth daily.   Yes Historical Provider, MD  magnesium oxide (MAG-OX) 400 MG tablet Take 400 mg by mouth daily.    Yes Historical Provider, MD  metFORMIN (GLUCOPHAGE) 500 MG tablet Take 500 mg  by mouth 2 (two) times daily.    Yes Historical Provider, MD  metoprolol tartrate (LOPRESSOR) 25 MG tablet Take 25 mg by mouth 2 (two) times daily.    Yes Historical Provider, MD  Rivaroxaban (XARELTO) 15 MG TABS tablet Take 1 tablet (15 mg total) by mouth daily. 04/27/15  Yes Henreitta Leber, MD  rosuvastatin (CRESTOR) 20 MG tablet Take 1 tablet (20 mg total) by mouth daily at 6 PM. 04/27/15  Yes Henreitta Leber, MD     Review of Systems  Constitutional: Positive for fatigue. Negative for appetite change.  HENT: Positive for hearing loss. Negative for congestion, rhinorrhea and sore throat.    Eyes: Negative.   Respiratory: Positive for shortness of breath. Negative for cough and chest tightness.   Cardiovascular: Negative for chest pain, palpitations and leg swelling.  Gastrointestinal: Negative for abdominal pain and abdominal distention.  Endocrine: Negative.   Genitourinary: Negative.   Musculoskeletal: Positive for back pain. Negative for neck pain.  Skin: Negative.   Allergic/Immunologic: Negative.   Neurological: Positive for dizziness. Negative for light-headedness and headaches.  Hematological: Negative for adenopathy. Does not bruise/bleed easily.  Psychiatric/Behavioral: Positive for sleep disturbance (waking up multiple times to urinate (follows with urology)) and dysphoric mood (due to wife's end stage parkinson's). The patient is not nervous/anxious.        Objective:   Physical Exam  Constitutional: He is oriented to person, place, and time. He appears well-developed and well-nourished.  HENT:  Head: Normocephalic and atraumatic.  Eyes: Conjunctivae are normal. Pupils are equal, round, and reactive to light.  Neck: Normal range of motion. Neck supple.  Cardiovascular: Normal rate.  An irregular rhythm present.  Pulmonary/Chest: Effort normal. He has no wheezes. He has no rales.  Abdominal: Soft. He exhibits no distension. There is no tenderness.  Musculoskeletal: He exhibits no edema or tenderness.  Neurological: He is alert and oriented to person, place, and time.  Skin: Skin is warm and dry.  Psychiatric: He has a normal mood and affect. His behavior is normal. Thought content normal.  Nursing note and vitals reviewed.   BP 86/55 mmHg  Pulse 80  Resp 20  Ht 5\' 8"  (1.727 m)  Wt 161 lb (73.029 kg)  BMI 24.49 kg/m2  SpO2 100%       Assessment & Plan:  1: Chronic heart failure with reduced ejection fraction- Patient presents with fatigue and shortness of breath with exertion. When he does experience symptoms, he will have to stop what he's doing  to rest until his symptoms improve. He denies any swelling in his lower legs. Is already weighing himself daily and says that his weight has been stable. Instructed to call for an overnight weight gain of >2 pounds or a weekly weight gain of >5 pounds. He only takes his furosemide if his weight is >159 pounds which it hasn't been. He does not add any sat to this food and tries to look at food labels. Reviewed the importance of following a 2000mg  sodium diet and written information was given to him about this. Due to his low blood pressure, will be difficult to titrate or add any medication. Has already received his flu vaccine for this season. [facility] went in and reviewed medications with him.  2: Chronic atrial fibrillation- Currently rate controlled with metoprolol and diltiazem. Also taking xarelto. Follows with cardiology. 3: Hypotension- Blood pressure is on the low side and he said that it's been that way since he was  released from the hospital. Certainly his low blood pressure could be contributing to his fatigue and dizziness. Discussed possibly decreasing his diltiazem at his next visit if it remains low. He says that it was higher a couple of days ago when he saw his PCP.  4: Diabetes- Patient says that his glucose was 148 yesterday. Takes metformin twice daily. Follows with his PCP regarding this.   Return here in 1 month or sooner for any questions/problems before then.

## 2015-05-19 NOTE — Patient Instructions (Signed)
Continue weighing daily and call for an overnight weight gain of > 2 pounds or a weekly weight gain of >5 pounds. 

## 2015-05-23 ENCOUNTER — Telehealth: Payer: Self-pay | Admitting: Family

## 2015-05-23 NOTE — Telephone Encounter (Signed)
Spoke with patient's cardiologist Rockey Situ) in regards to patient's low blood pressure. He wants patient to stop the isosorbide since he's not having any chest pain. Patient was advised of this change and verbalizes understanding. Patient says that he tends to get short of breath in the mornings when he's up moving around but it improves as the day goes on. Weight is unchanged and he says that he only takes his furosemide if his weight goes above 159 pounds which it hasn't. Sees cardiology 06/09/15. He is to call back for any change in his symptoms. Home health nurse continues to come.

## 2015-05-23 NOTE — Telephone Encounter (Signed)
Left message on voicemail for patient to call the office regarding a medication change.

## 2015-06-09 ENCOUNTER — Ambulatory Visit (INDEPENDENT_AMBULATORY_CARE_PROVIDER_SITE_OTHER): Payer: Medicare Other | Admitting: Physician Assistant

## 2015-06-09 ENCOUNTER — Encounter: Payer: Self-pay | Admitting: Physician Assistant

## 2015-06-09 VITALS — BP 116/68 | HR 87 | Ht 68.0 in | Wt 153.0 lb

## 2015-06-09 DIAGNOSIS — E43 Unspecified severe protein-calorie malnutrition: Secondary | ICD-10-CM

## 2015-06-09 DIAGNOSIS — I420 Dilated cardiomyopathy: Secondary | ICD-10-CM | POA: Diagnosis not present

## 2015-06-09 DIAGNOSIS — I2510 Atherosclerotic heart disease of native coronary artery without angina pectoris: Secondary | ICD-10-CM | POA: Diagnosis not present

## 2015-06-09 DIAGNOSIS — I482 Chronic atrial fibrillation, unspecified: Secondary | ICD-10-CM

## 2015-06-09 DIAGNOSIS — I10 Essential (primary) hypertension: Secondary | ICD-10-CM

## 2015-06-09 DIAGNOSIS — N182 Chronic kidney disease, stage 2 (mild): Secondary | ICD-10-CM

## 2015-06-09 DIAGNOSIS — R0602 Shortness of breath: Secondary | ICD-10-CM

## 2015-06-09 MED ORDER — METOPROLOL SUCCINATE ER 50 MG PO TB24: 50.0000 mg | ORAL_TABLET | Freq: Every day | ORAL | Status: DC

## 2015-06-09 NOTE — Patient Instructions (Addendum)
Medication Instructions: Please STOP taking your metoprolol. Start Toprol XL 50 mg Once Daily  Please increase your food intake.  Use over the counter nasal saline spray 2 to 3 times a day for dry nose.  Use over the counter Afrin spray as needed for nose bleeds.  Labwork: Today CBC, BMET, PT/INR  Testing/Procedures: Your physician has recommended that you have a Cardioversion (DCCV). Electrical Cardioversion uses a jolt of electricity to your heart either through paddles or wired patches attached to your chest. This is a controlled, usually prescheduled, procedure. Defibrillation is done under light anesthesia in the hospital, and you usually go home the day of the procedure. This is done to get your heart back into a normal rhythm. You are not awake for the procedure. Please see the instruction sheet given to you today.  You are scheduled for a Cardioversion on Friday, Feb 24 with Dr. Fletcher Anon Please arrive at the [facility] at 6:30 a.m. on the day of your procedure.  DIET INSTRUCTIONS:  Nothing to eat or drink after midnight except your medications with a sip of water.  1) Medications:  YOU MAY TAKE ALL of your remaining medications with a small amount of water.  2) Must have a responsible person to drive you home.  3) Bring a current list of your medications and current insurance cards.   If you have any questions after you get home, please call the office at [phone]   Follow-Up: Your physician recommends that you schedule a follow-up appointment in:  Christell Faith PA in one week after cardioversion  Date & time: ________________________________________  If you need a refill on your cardiac medications before your next appointment, please call your pharmacy.  Electrical Cardioversion Electrical cardioversion is the delivery of a jolt of electricity to change the rhythm of the heart. Sticky patches or metal paddles are placed on the chest to deliver the electricity from  a device. This is done to restore a normal rhythm. A rhythm that is too fast or not regular keeps the heart from pumping well. Electrical cardioversion is done in an emergency if:   There is low or no blood pressure as a result of the heart rhythm.   Normal rhythm must be restored as fast as possible to protect the brain and heart from further damage.   It may save a life. Cardioversion may be done for heart rhythms that are not immediately life threatening, such as atrial fibrillation or flutter, in which:   The heart is beating too fast or is not regular.   Medicine to change the rhythm has not worked.   It is safe to wait in order to allow time for preparation.  Symptoms of the abnormal rhythm are bothersome.  The risk of stroke and other serious problems can be reduced. LET [facility] CARE PROVIDER KNOW ABOUT:   Any allergies you have.  All medicines you are taking, including vitamins, herbs, eye drops, creams, and over-the-counter medicines.  Previous problems you or members of your family have had with the use of anesthetics.   Any blood disorders you have.   Previous surgeries you have had.   Medical conditions you have. RISKS AND COMPLICATIONS  Generally, this is a safe procedure. However, problems can occur and include:   Breathing problems related to the anesthetic used.  A blood clot that breaks free and travels to other parts of your body. This could cause a stroke or other problems. The risk of this  is lowered by use of blood-thinning medicine (anticoagulant) prior to the procedure.  Cardiac arrest (rare). BEFORE THE PROCEDURE   You may have tests to detect blood clots in your heart and to evaluate heart function.  You may start taking anticoagulants so your blood does not clot as easily.   Medicines may be given to help stabilize your heart rate and rhythm. PROCEDURE  You will be given medicine through an IV tube to reduce discomfort and make  you sleepy (sedative).   An electrical shock will be delivered. AFTER THE PROCEDURE Your heart rhythm will be watched to make sure it does not change.    This information is not intended to replace advice given to you by your health care provider. Make sure you discuss any questions you have with your health care provider.   Document Released: 03/30/2002 Document Revised: 04/30/2014 Document Reviewed: 10/22/2012 Elsevier Interactive Patient Education 2016 [employer]. Electrical Cardioversion, Care After Refer to this sheet in the next few weeks. These instructions provide you with information on caring for yourself after your procedure. Your health care provider may also give you more specific instructions. Your treatment has been planned according to current medical practices, but problems sometimes occur. Call your health care provider if you have any problems or questions after your procedure. WHAT TO EXPECT AFTER THE PROCEDURE After your procedure, it is typical to have the following sensations:  Some redness on the skin where the shocks were delivered. If this is tender, a sunburn lotion or hydrocortisone cream may help.  Possible return of an abnormal heart rhythm within hours or days after the procedure. HOME CARE INSTRUCTIONS  Take medicines only as directed by your health care provider. Be sure you understand how and when to take your medicine.  Learn how to feel your pulse and check it often.  Limit your activity for 48 hours after the procedure or as directed by your health care provider.  Avoid or minimize caffeine and other stimulants as directed by your health care provider. SEEK MEDICAL CARE IF:  You feel like your heart is beating too fast or your pulse is not regular.  You have any questions about your medicines.  You have bleeding that will not stop. SEEK IMMEDIATE MEDICAL CARE IF:  You are dizzy or feel faint.  It is hard to breathe or you feel short of  breath.  There is a change in discomfort in your chest.  Your speech is slurred or you have trouble moving an arm or leg on one side of your body.  You get a serious muscle cramp that does not go away.  Your fingers or toes turn cold or blue.   This information is not intended to replace advice given to you by your health care provider. Make sure you discuss any questions you have with your health care provider.   Document Released: 01/28/2013 Document Revised: 04/30/2014 Document Reviewed: 01/28/2013 Elsevier Interactive Patient Education 2016 Paint are common. They are due to a crack in the inside lining of your nose (mucous membrane) or from a small blood vessel that starts to bleed. Nosebleeds can be caused by many conditions, such as injury, infections, dry mucous membranes or dry climate, medicines, nose picking, and home heating and cooling systems. Most nosebleeds come from blood vessels in the front of your nose. HOME CARE INSTRUCTIONS   Try controlling your nosebleed by pinching your nostrils gently and continuously for at least 10 minutes.  Avoid  blowing or sniffing your nose for a number of hours after having a nosebleed.  Do not put gauze inside your nose yourself. If your nose was packed by your health care provider, try to maintain the pack inside of your nose until your health care provider removes it.  If a gauze pack was used and it starts to fall out, gently replace it or cut off the end of it.  If a balloon catheter was used to pack your nose, do not cut or remove it unless your health care provider has instructed you to do that.  Avoid lying down while you are having a nosebleed. Sit up and lean forward.  Use a nasal spray decongestant to help with a nosebleed as directed by your health care provider.  Do not use petroleum jelly or mineral oil in your nose. These can drip into your lungs.  Maintain humidity in your home by using  less air conditioning or by using a humidifier.  Aspirinand blood thinners make bleeding more likely. If you are prescribed these medicines and you suffer from nosebleeds, ask your health care provider if you should stop taking the medicines or adjust the dose. Do not stop medicines unless directed by your health care provider  Resume your normal activities as you are able, but avoid straining, lifting, or bending at the waist for several days.  If your nosebleed was caused by dry mucous membranes, use over-the-counter saline nasal spray or gel. This will keep the mucous membranes moist and allow them to heal. If you must use a lubricant, choose the water-soluble variety. Use it only sparingly, and do not use it within several hours of lying down.  Keep all follow-up visits as directed by your health care provider. This is important. SEEK MEDICAL CARE IF:  You have a fever.  You get frequent nosebleeds.  You are getting nosebleeds more often. SEEK IMMEDIATE MEDICAL CARE IF:  Your nosebleed lasts longer than 20 minutes.  Your nosebleed occurs after an injury to your face, and your nose looks crooked or broken.  You have unusual bleeding from other parts of your body.  You have unusual bruising on other parts of your body.  You feel light-headed or you faint.  You become sweaty.  You vomit blood.  Your nosebleed occurs after a head injury.   This information is not intended to replace advice given to you by your health care provider. Make sure you discuss any questions you have with your health care provider.   Document Released: 01/17/2005 Document Revised: 04/30/2014 Document Reviewed: 11/23/2013 Elsevier Interactive Patient Education 2016 Elsevier Inc. High-Protein and High-Calorie Diet Eating high-protein and high-calorie foods can help you to gain weight, heal after an injury, and recover after an illness or surgery.  WHAT IS MY PLAN? The specific amount of daily protein  and calories you need depends on:  Your body weight.  The reason this diet is recommended for you. Generally, a high-protein, high-calorie diet involves:   Eating 250-500 extra calories each day.  Making sure that 10-35% of your daily calories come from protein. Talk to your health care provider about how much protein and how many calories you need each day. Follow the diet as directed by your health care provider.  WHAT DO I NEED TO KNOW ABOUT THIS DIET?  Ask your health care provider if you should take a nutritional supplement.   Try to eat six small meals each day instead of three large meals.  Eat a balanced diet, including one food that is high in protein at each meal.  Keep nutritious snacks handy, such as nuts, trail mixes, dried fruit, and yogurt.   If you have kidney disease or diabetes, eating too much protein may put extra stress on your kidneys. Talk to your health care provider if you have either of those conditions. WHAT ARE SOME HIGH-PROTEIN FOODS? Grains Quinoa. Bulgur wheat. Vegetables Soybeans. Peas. Meats and Other Protein Sources Beef, pork, and poultry. Fish and seafood. Eggs. Tofu. Textured vegetable protein (TVP). Peanut butter. Nuts and seeds. Dried beans. Protein powders. Dairy Whole milk. Whole-milk yogurt. Powdered milk. Cheese. [employer]. Eggnog. Beverages High-protein supplement drinks. Soy milk. Other Protein bars. The items listed above may not be a complete list of recommended foods or beverages. Contact your dietitian for more options. WHAT ARE SOME HIGH-CALORIE FOODS? Grains Pasta. Quick breads. Muffins. Pancakes. Ready-to-eat cereal. Vegetables Vegetables cooked in oil or butter. Fried potatoes. Fruits Dried fruit. Fruit leather. Canned fruit in syrup. Fruit juice. Avocados. Meats and Other Protein Sources Peanut butter. Nuts and seeds. Dairy Heavy cream. Whipped cream. Cream cheese. Sour cream. Ice cream. Custard.  Pudding. Beverages Meal-replacement beverages. Nutrition shakes. Fruit juice. Sugar-sweetened soft drinks. Condiments Salad dressing. Mayonnaise. Alfredo sauce. Fruit preserves or jelly. Honey. Syrup. Sweets/Desserts Cake. Cookies. Pie. Pastries. Candy bars. Chocolate. Fats and Oils Butter or margarine. Oil. Gravy. Other Meal-replacement bars. The items listed above may not be a complete list of recommended foods or beverages. Contact your dietitian for more options. WHAT ARE SOME TIPS FOR INCLUDING HIGH-PROTEIN AND HIGH-CALORIE FOODS IN MY DIET?  Add whole milk, half-and-half, or heavy cream to cereal, pudding, soup, or hot cocoa.  Add whole milk to instant breakfast drinks.  Add peanut butter to oatmeal or smoothies.  Add powdered milk to baked goods, smoothies, or milkshakes.  Add powdered milk, cream, or butter to mashed potatoes.  Add cheese to cooked vegetables.  Make whole-milk yogurt parfaits. Top them with granola, fruit, or nuts.  Add cottage cheese to your fruit.  Add avocados, cheese, or both to sandwiches or salads.  Add meat, poultry, or seafood to rice, pasta, casseroles, salads, and soups.   Use mayonnaise when making egg salad, chicken salad, or tuna salad.   Use peanut butter as a topping for pretzels, celery, or crackers.  Add beans to casseroles, dips, and spreads.  Add pureed beans to sauces and soups.  Replace calorie-free drinks with calorie-containing drinks, such as milk and fruit juice.   This information is not intended to replace advice given to you by your health care provider. Make sure you discuss any questions you have with your health care provider.   Document Released: 04/09/2005 Document Revised: 04/30/2014 Document Reviewed: 09/22/2013 Elsevier Interactive Patient Education [employer].

## 2015-06-09 NOTE — Progress Notes (Signed)
Cardiology Office Note Date:  06/09/2015  Patient ID:  Gregory Tyler, Gregory Tyler 1941/02/08, MRN [id number] PCP:  [name], MD  Cardiologist:  Dr. Rockey Situ, MD    Chief Complaint: Follow up for chronic Afib and chronic combined CHF  History of Present Illness: Gregory Tyler is a 75 y.o. male with history of CAD with prior cath in 02/2014 not available, chronic Afib since at least 01/2014 on Xarelto, chronic combined CHF, emphysema, HTN, orthostatic hypotension, prostate cancer, and DM2 who presents for routine follow up of the above.   Recently admitted in early January for acute combined CHF and Afib with RVR and in mid January with Afib with RVR, found to be volume depleted. During his admission in early January he was started on diltiazem for rate control and started on Lasix for diuresis. Echo showed EF of 35-40%, PASP 40 mm Hg. He was started on Xarelto for anticoagulation. In hospital follow up with Dr. Rockey Situ on 05/09/2015 he reported a weight of 168 upon arriving home on 1/4 from the hospital, prior weight of 182 upon admission to [facility] on 1/1. At his OV on 1/16 he weighed 150 pounds. His BP had been running low and was 86/62 at his OV on 1/16. At that time his Lasix and Imdur were held. He reported decreased PO intake. Labs showed a K+ of 6.9, though there were hemolyzed cells, SCr 2.27, BUN 67. Recheck K+ showed a K+ of 6.3, SCr of 2.49, BUN of 67. He was advised to go to the ED. Instead he decided to wait and went to his PCP's office on 1/18 and had a repeat bmet that showed a K+ of 5.7 with a SCr of 2.2, BUN 59. He was again advised to go to the ED. Upon his admission to the ED on 1/18 he was weak and dizzy. He was in Afib with RR with HR in the 140's to 150's. His admission weight was stable at 150. SCr was 2.19-->1.63, BUN 62, K+ 5.7 which improved prior to discharge to 4.8. He was rate controlled and received IV hydration with improvement in his symptoms. It was felt he was dehyrated  and did not need Lasix on a daily basis.   He continues to note SOB, mostly with exertion. This is not new and unchanged from prior. No chest pain. His weight continues to decline on his at home weight charts with a current at home weight stable at 145. He is not eating much at at these days. He reports eating a couple boiled eggs and 3 to 4 sixteen oz bottles of water daily. Sometimes he will have some salmon and Cheerios. He reports dark urine. No orthopnea or lower extremity swelling. He denies any tachy-palpitations. He has been tacking his Xarelto as directed and has not missed any doses. He has not needed to take any lasix since his discharge. His home oxygen has been drying his nasal passages leading to epistaxis at times.    Past Medical History  Diagnosis Date  . Prostate cancer ([place])   . Arthritis   . Diabetes mellitus without complication ([place])   . Hypertension   . CHF (congestive heart failure) (H[facility])     Combined Systolic and Diastolic; EF 123456 in [id number]  . Emphysema lung ([place])   . Hyperlipidemia   . Gout   . Gastric ulcer   . Anemia   . Nephrolithiasis   . Coronary artery disease   . Anxiety   . Depression   .  Chronic atrial fibrillation ([facility])     a. on Xarelto  . Shortness of breath dyspnea     Past Surgical History  Procedure Laterality Date  . Cholecystectomy    . Appendectomy    . Gastric bypass    . Hemorrhoid surgery    . Back surgery    . Pilonidal cyst      Current Outpatient Prescriptions  Medication Sig Dispense Refill  . allopurinol (ZYLOPRIM) 300 MG tablet Take 300 mg by mouth daily.     . budesonide-formoterol (SYMBICORT) 160-4.5 MCG/ACT inhaler Inhale 2 puffs into the lungs 2 (two) times daily.    Marland Kitchen diltiazem (CARDIZEM CD) 120 MG 24 hr capsule Take 1 capsule (120 mg total) by mouth daily. 60 capsule 1  . HYDROcodone-acetaminophen (NORCO/VICODIN) 5-325 MG per tablet Take 1 tablet by mouth every 4 (four) hours as needed for moderate pain.     .  magnesium oxide (MAG-OX) 400 MG tablet Take 400 mg by mouth daily.     . metFORMIN (GLUCOPHAGE) 500 MG tablet Take 500 mg by mouth 2 (two) times daily.     . Rivaroxaban (XARELTO) 15 MG TABS tablet Take 1 tablet (15 mg total) by mouth daily. 60 tablet 1  . rosuvastatin (CRESTOR) 20 MG tablet Take 1 tablet (20 mg total) by mouth daily at 6 PM. 60 tablet 1  . metoprolol succinate (TOPROL-XL) 50 MG 24 hr tablet Take 1 tablet (50 mg total) by mouth daily. Take with or immediately following a meal. 30 tablet 11   No current facility-administered medications for this visit.    Allergies:   Aspirin and Codeine sulfate   Social History:  The patient  reports that he has quit smoking. He uses smokeless tobacco. He reports that he does not drink alcohol or use illicit drugs.   Family History:  The patient's family history includes CAD in his mother; Diabetes in his other; Stroke in his mother.  ROS:   Review of Systems  Constitutional: Positive for weight loss and malaise/fatigue. Negative for fever, chills and diaphoresis.  HENT: Positive for nosebleeds. Negative for congestion.   Eyes: Negative for discharge and redness.  Respiratory: Positive for cough, sputum production and shortness of breath. Negative for hemoptysis and wheezing.        Rare cough, rare white sputum  Cardiovascular: Negative for chest pain, palpitations, orthopnea, claudication, leg swelling and PND.  Gastrointestinal: Negative for nausea, vomiting and abdominal pain.  Musculoskeletal: Negative for myalgias and falls.  Skin: Negative for rash.  Neurological: Positive for weakness. Negative for dizziness, sensory change, speech change and focal weakness.  Endo/Heme/Allergies: Does not bruise/bleed easily.  Psychiatric/Behavioral: The patient is not nervous/anxious.   All other systems reviewed and are negative.     PHYSICAL EXAM:  VS:  BP 116/68 mmHg  Pulse 87  Ht 5\' 8"  (1.727 m)  Wt 153 lb (69.4 kg)  BMI 23.27 kg/m2  BMI: Body mass index is 23.27 kg/(m^2). Well nourished, well developed, in no acute distress HEENT: normocephalic, atraumatic Neck: no JVD, carotid bruits or masses Cardiac: irregularly-irregular, S1, S2; RRR; no murmurs, rubs, or gallops Lungs:  clear to auscultation bilaterally, no wheezing, rhonchi or rales Abd: soft, nontender, no hepatomegaly, + BS MS: no deformity or atrophy Ext: no edema Skin: warm and dry, no rash Neuro:  moves all extremities spontaneously, no focal abnormalities noted, follows commands Psych: euthymic mood, full affect   EKG:  Was ordered today. Shows Afib, 87 bpm, nonspecific lateral st/t  changes   Recent Labs: 04/25/2015: B Natriuretic Peptide 765.0* 05/11/2015: ALT 48 05/12/2015: Hemoglobin 13.0; Platelets 203; TSH 1.925 05/13/2015: BUN 37*; Creatinine, Ser 1.63*; Potassium 4.8; Sodium 133*  No results found for requested labs within last 365 days.   CrCl cannot be calculated (Patient has no serum creatinine result on file.).   Wt Readings from Last 3 Encounters:  06/09/15 153 lb (69.4 kg)  05/19/15 161 lb (73.029 kg)  05/13/15 157 lb 11.2 oz (71.532 kg)     Other studies reviewed: Additional studies/records reviewed today include: summarized above  ASSESSMENT AND PLAN:  1. Chronic Afib: Schedule DCCV in an attempt to restore normal sinus rhythm. He has not missed any doses of Xarelto. Based on his CrCl of 38.44 mL/min Xarelto 15 mg q dinner is the correct dosing. Change Lopressor to Toprol XL 50 mg daily given his cardiomyopathy. Continue Cardizem CD 120 mg daily, for now. Perhaps this can be discontinued if sinus rhythm is restored given his cardiomyopathy. CHADS2VASc at least 4 (CHF, HTN, age x 2).  2. Chronic combined CHF: He does not appear to be volume overloaded at this time. Change Lopressor to Toprol XL 50 mg daily given his cardiomyopathy. Continue prn Lasix for weight gain of >159 pounds. Limit salt intake.   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 Check bmet. He may need to slightly increase his water intake.   4. CAD: No symptoms concerning for angina. Continue current medications. No ischemic work up at this time. On Xarelto in place of aspirin.   5. Emphysema: He may benefit from a pulmonary evaluation.   6. HTN: Well controlled. Continue current medications as above.  7. Malnutrition: Increase PO food consumption. Supplement with Boost or Ensure.   Disposition: F/u with Dr. Rockey Situ, MD s/p DCCV  Current medicines are reviewed at length with the patient today.  The patient did not have any concerns regarding medicines.  Melvern Banker PA-C 06/09/2015 12:52 PM     [facility] [address], [place] [address] [phone]

## 2015-06-10 ENCOUNTER — Emergency Department
Admission: EM | Admit: 2015-06-10 | Discharge: 2015-06-10 | Disposition: A | Discharge status: Home / Self Care | Payer: Medicare Other | Attending: Emergency Medicine | Admitting: Emergency Medicine

## 2015-06-10 ENCOUNTER — Emergency Department: Payer: Medicare Other

## 2015-06-10 ENCOUNTER — Encounter: Payer: Self-pay | Admitting: Emergency Medicine

## 2015-06-10 DIAGNOSIS — Y9289 Other specified places as the place of occurrence of the external cause: Secondary | ICD-10-CM | POA: Insufficient documentation

## 2015-06-10 DIAGNOSIS — I10 Essential (primary) hypertension: Secondary | ICD-10-CM | POA: Insufficient documentation

## 2015-06-10 DIAGNOSIS — Z7984 Long term (current) use of oral hypoglycemic drugs: Secondary | ICD-10-CM | POA: Insufficient documentation

## 2015-06-10 DIAGNOSIS — I482 Chronic atrial fibrillation: Secondary | ICD-10-CM | POA: Diagnosis not present

## 2015-06-10 DIAGNOSIS — Z79899 Other long term (current) drug therapy: Secondary | ICD-10-CM | POA: Diagnosis not present

## 2015-06-10 DIAGNOSIS — Z7951 Long term (current) use of inhaled steroids: Secondary | ICD-10-CM | POA: Insufficient documentation

## 2015-06-10 DIAGNOSIS — E119 Type 2 diabetes mellitus without complications: Secondary | ICD-10-CM | POA: Insufficient documentation

## 2015-06-10 DIAGNOSIS — Z87891 Personal history of nicotine dependence: Secondary | ICD-10-CM | POA: Diagnosis not present

## 2015-06-10 DIAGNOSIS — R0600 Dyspnea, unspecified: Secondary | ICD-10-CM | POA: Diagnosis not present

## 2015-06-10 DIAGNOSIS — R05 Cough: Secondary | ICD-10-CM | POA: Insufficient documentation

## 2015-06-10 DIAGNOSIS — Y998 Other external cause status: Secondary | ICD-10-CM | POA: Insufficient documentation

## 2015-06-10 DIAGNOSIS — Z7901 Long term (current) use of anticoagulants: Secondary | ICD-10-CM | POA: Insufficient documentation

## 2015-06-10 DIAGNOSIS — T457X4A Poisoning by anticoagulant antagonists, vitamin K and other coagulants, undetermined, initial encounter: Secondary | ICD-10-CM | POA: Diagnosis not present

## 2015-06-10 DIAGNOSIS — Y9389 Activity, other specified: Secondary | ICD-10-CM | POA: Diagnosis not present

## 2015-06-10 DIAGNOSIS — R059 Cough, unspecified: Secondary | ICD-10-CM

## 2015-06-10 DIAGNOSIS — R0602 Shortness of breath: Secondary | ICD-10-CM | POA: Diagnosis present

## 2015-06-10 DIAGNOSIS — T45514A Poisoning by anticoagulants, undetermined, initial encounter: Secondary | ICD-10-CM

## 2015-06-10 LAB — CBC WITH DIFFERENTIAL/PLATELET
BASOS: 0 %
Basophils Absolute: 0 10*3/uL (ref 0.0–0.2)
Basophils Absolute: 0 10*3/uL (ref 0–0.1)
Basophils Relative: 0 %
EOS (ABSOLUTE): 0.1 10*3/uL (ref 0.0–0.4)
EOS ABS: 0 10*3/uL (ref 0–0.7)
EOS: 1 %
Eosinophils Relative: 0 %
HCT: 40.7 % (ref 40.0–52.0)
HEMOGLOBIN: 13.3 g/dL (ref 13.0–18.0)
Hematocrit: 43.1 % (ref 37.5–51.0)
Hemoglobin: 14.3 g/dL (ref 12.6–17.7)
IMMATURE GRANS (ABS): 0 10*3/uL (ref 0.0–0.1)
IMMATURE GRANULOCYTES: 0 %
LYMPHS ABS: 1.4 10*3/uL (ref 1.0–3.6)
LYMPHS PCT: 18 %
LYMPHS: 27 %
Lymphocytes Absolute: 1.9 10*3/uL (ref 0.7–3.1)
MCH: 29.8 pg (ref 26.6–33.0)
MCH: 29.3 pg (ref 26.0–34.0)
MCHC: 33.2 g/dL (ref 31.5–35.7)
MCHC: 32.7 g/dL (ref 32.0–36.0)
MCV: 90 fL (ref 79–97)
MCV: 89.7 fL (ref 80.0–100.0)
MONOS ABS: 0.7 10*3/uL (ref 0.1–0.9)
Monocytes Absolute: 0.5 10*3/uL (ref 0.2–1.0)
Monocytes Relative: 7 %
Monocytes: 10 %
NEUTROS PCT: 62 %
NEUTROS PCT: 75 %
Neutro Abs: 5.8 10*3/uL (ref 1.4–6.5)
Neutrophils Absolute: 4.3 10*3/uL (ref 1.4–7.0)
PLATELETS: 230 10*3/uL (ref 150–379)
Platelets: 185 10*3/uL (ref 150–440)
RBC: 4.8 x10E6/uL (ref 4.14–5.80)
RBC: 4.53 MIL/uL (ref 4.40–5.90)
RDW: 17.8 % — AB (ref 12.3–15.4)
RDW: 18.6 % — ABNORMAL HIGH (ref 11.5–14.5)
WBC: 6.9 10*3/uL (ref 3.4–10.8)
WBC: 7.8 10*3/uL (ref 3.8–10.6)

## 2015-06-10 LAB — BASIC METABOLIC PANEL
ANION GAP: 10 (ref 5–15)
BUN / CREAT RATIO: 19 (ref 10–22)
BUN: 33 mg/dL — AB (ref 8–27)
BUN: 33 mg/dL — ABNORMAL HIGH (ref 6–20)
CALCIUM: 10 mg/dL (ref 8.6–10.2)
CALCIUM: 9.6 mg/dL (ref 8.9–10.3)
CHLORIDE: 100 mmol/L (ref 96–106)
CO2: 16 mmol/L — ABNORMAL LOW (ref 18–29)
CO2: 21 mmol/L — ABNORMAL LOW (ref 22–32)
CREATININE: 1.75 mg/dL — AB (ref 0.76–1.27)
Chloride: 105 mmol/L (ref 101–111)
Creatinine, Ser: 1.67 mg/dL — ABNORMAL HIGH (ref 0.61–1.24)
GFR calc non Af Amer: 37 mL/min/{1.73_m2} — ABNORMAL LOW (ref >59)
GFR calc non Af Amer: 38 mL/min — ABNORMAL LOW (ref >60)
GFR, EST AFRICAN AMERICAN: 43 mL/min/{1.73_m2} — AB (ref >59)
GFR, EST AFRICAN AMERICAN: 45 mL/min — AB (ref >60)
Glucose, Bld: 184 mg/dL — ABNORMAL HIGH (ref 65–99)
Glucose: 134 mg/dL — ABNORMAL HIGH (ref 65–99)
POTASSIUM: 5.2 mmol/L — AB (ref 3.5–5.1)
Potassium: 6.3 mmol/L — ABNORMAL HIGH (ref 3.5–5.2)
SODIUM: 136 mmol/L (ref 135–145)
Sodium: 140 mmol/L (ref 134–144)

## 2015-06-10 LAB — FIBRIN DERIVATIVES D-DIMER (ARMC ONLY): Fibrin derivatives D-dimer (ARMC): 523 — ABNORMAL HIGH (ref 0–499)

## 2015-06-10 LAB — BRAIN NATRIURETIC PEPTIDE: B Natriuretic Peptide: 231 pg/mL — ABNORMAL HIGH (ref 0.0–100.0)

## 2015-06-10 LAB — TROPONIN I: Troponin I: 0.05 ng/mL — ABNORMAL HIGH (ref <0.031)

## 2015-06-10 LAB — PROTIME-INR
INR: 1.5 — AB (ref 0.8–1.2)
INR: 4.3 — AB
Prothrombin Time: 15.2 s — ABNORMAL HIGH (ref 9.1–12.0)
Prothrombin Time: 40.1 seconds — ABNORMAL HIGH (ref 11.4–15.0)

## 2015-06-10 MED ORDER — IOHEXOL 350 MG/ML SOLN
60.0000 mL | Freq: Once | INTRAVENOUS | Status: AC | PRN
  Administered 2015-06-10: 60 mL via INTRAVENOUS

## 2015-06-10 NOTE — ED Notes (Signed)
Lab called regarding CBC add on, states "lost the second purple that was sent down, needs to be drawn again" RN verbalized understanding at this time, will re draw another purple top.

## 2015-06-10 NOTE — ED Notes (Signed)
Pt sent over per cardiologist for further eval of abnormal labs.

## 2015-06-10 NOTE — ED Notes (Signed)
Patient transported to CT 

## 2015-06-10 NOTE — Discharge Instructions (Signed)
Cough, Adult Coughing is a reflex that clears your throat and your airways. Coughing helps to heal and protect your lungs. It is normal to cough occasionally, but a cough that happens with other symptoms or lasts a long time may be a sign of a condition that needs treatment. A cough may last only 2-3 weeks (acute), or it may last longer than 8 weeks (chronic). CAUSES Coughing is commonly caused by:  Breathing in substances that irritate your lungs.  A viral or bacterial respiratory infection.  Allergies.  Asthma.  Postnasal drip.  Smoking.  Acid backing up from the stomach into the esophagus (gastroesophageal reflux).  Certain medicines.  Chronic lung problems, including COPD (or rarely, lung cancer).  Other medical conditions such as heart failure. HOME CARE INSTRUCTIONS  Pay attention to any changes in your symptoms. Take these actions to help with your discomfort:  Take medicines only as told by your health care provider.  If you were prescribed an antibiotic medicine, take it as told by your health care provider. Do not stop taking the antibiotic even if you start to feel better.  Talk with your health care provider before you take a cough suppressant medicine.  Drink enough fluid to keep your urine clear or pale yellow.  If the air is dry, use a cold steam vaporizer or humidifier in your bedroom or your home to help loosen secretions.  Avoid anything that causes you to cough at work or at home.  If your cough is worse at night, try sleeping in a semi-upright position.  Avoid cigarette smoke. If you smoke, quit smoking. If you need help quitting, ask your health care provider.  Avoid caffeine.  Avoid alcohol.  Rest as needed. SEEK MEDICAL CARE IF:   You have new symptoms.  You cough up pus.  Your cough does not get better after 2-3 weeks, or your cough gets worse.  You cannot control your cough with suppressant medicines and you are losing sleep.  You  develop pain that is getting worse or pain that is not controlled with pain medicines.  You have a fever.  You have unexplained weight loss.  You have night sweats. SEEK IMMEDIATE MEDICAL CARE IF:  You cough up blood.  You have difficulty breathing.  Your heartbeat is very fast.   This information is not intended to replace advice given to you by your health care provider. Make sure you discuss any questions you have with your health care provider.   Document Released: 10/06/2010 Document Revised: 12/29/2014 Document Reviewed: 06/16/2014 Elsevier Interactive Patient Education [employer].  Please return immediately if condition worsens. Please contact her primary physician or the physician you were given for referral. If you have any specialist physicians involved in her treatment and plan please also contact them. Thank you for using [facility]. Please hold her Coumadin (warfarin) for 1 day. Do not take this medication in the morning. He may continue with the following day. You will need a recheck of your blood work on Monday.

## 2015-06-10 NOTE — ED Provider Notes (Signed)
Time Seen: Approximately 1100 I have reviewed the triage notes  Chief Complaint: Shortness of Breath   History of Present Illness: Gregory Tyler is a 75 y.o. male who was referred here from his cardiologist office for evaluation of some abnormal laboratory work. Patient had laboratory done yesterday which showed an elevated potassium. He states he's had some persistent shortness of breath and is been seen by his cardiologist and evaluated. He has chronic atrial fibrillation and is currently on Coumadin. Patient was previously on Xeralto. Patient states his shortness of breath is been relatively persistent and there's plan for cardioversion with his atrial fibrillation. She was recently switched on his antiarrhythmic medication. By description it sounds like he was switched from Meteprolol Toprol-XL.   Past Medical History  Diagnosis Date  . Prostate cancer ([place])   . Arthritis   . Diabetes mellitus without complication ([place])   . Hypertension   . CHF (congestive heart failure) (HCC)     Combined Systolic and Diastolic; EF 123456 in [id number]  . Emphysema lung ([place])   . Hyperlipidemia   . Gout   . Gastric ulcer   . Anemia   . Nephrolithiasis   . Coronary artery disease   . Anxiety   . Depression   . Chronic atrial fibrillation (HCC)     a. on Xarelto  . Shortness of breath dyspnea     Patient Active Problem List   Diagnosis Date Noted  . Chronic systolic heart failure ([place]) 05/19/2015  . Diabetes ([place]) 05/19/2015  . Acute renal failure (ARF) ([place]) 05/11/2015  . Hyperkalemia   . Dehydration   . Cardiomyopathy ([place])   . Orthostatic hypotension 05/09/2015  . Congestive dilated cardiomyopathy ([place]) 05/09/2015  . Polypharmacy 04/27/2015  . Chronic atrial fibrillation ([place]) 04/25/2015  . Persistent atrial fibrillation with rapid ventricular response ([place])   . Dyspnea   . Coronary artery disease involving native coronary artery of native heart without angina pectoris   .  Essential hypertension     Past Surgical History  Procedure Laterality Date  . Cholecystectomy    . Appendectomy    . Gastric bypass    . Hemorrhoid surgery    . Back surgery    . Pilonidal cyst      Past Surgical History  Procedure Laterality Date  . Cholecystectomy    . Appendectomy    . Gastric bypass    . Hemorrhoid surgery    . Back surgery    . Pilonidal cyst      Current Outpatient Rx  Name  Route  Sig  Dispense  Refill  . allopurinol (ZYLOPRIM) 300 MG tablet   Oral   Take 300 mg by mouth daily.          . budesonide-formoterol (SYMBICORT) 160-4.5 MCG/ACT inhaler   Inhalation   Inhale 2 puffs into the lungs 2 (two) times daily.         Marland Kitchen diltiazem (CARDIZEM CD) 120 MG 24 hr capsule   Oral   Take 1 capsule (120 mg total) by mouth daily.   60 capsule   1   . HYDROcodone-acetaminophen (NORCO/VICODIN) 5-325 MG per tablet   Oral   Take 1 tablet by mouth every 4 (four) hours as needed for moderate pain.          . magnesium oxide (MAG-OX) 400 MG tablet   Oral   Take 400 mg by mouth daily.          . metFORMIN (GLUCOPHAGE) 500  MG tablet   Oral   Take 500 mg by mouth 2 (two) times daily.          . metoprolol succinate (TOPROL-XL) 50 MG 24 hr tablet   Oral   Take 1 tablet (50 mg total) by mouth daily. Take with or immediately following a meal.   30 tablet   11   . Rivaroxaban (XARELTO) 15 MG TABS tablet   Oral   Take 1 tablet (15 mg total) by mouth daily.   60 tablet   1   . rosuvastatin (CRESTOR) 20 MG tablet   Oral   Take 1 tablet (20 mg total) by mouth daily at 6 PM.   60 tablet   1     Allergies:  Aspirin and Codeine sulfate  Family History: Family History  Problem Relation Age of Onset  . CAD Mother   . Stroke Mother   . Diabetes Other     Social History: Social History  Substance Use Topics  . Smoking status: Former Research scientist (life sciences)  . Smokeless tobacco: Current User  . Alcohol Use: No     Review of Systems:   10 point  review of systems was performed and was otherwise negative:  Constitutional: No fever Eyes: No visual disturbances ENT: No sore throat, ear pain Cardiac: No chest pain Respiratory: Patient describes a 2 month history of shortness of breath and cough Abdomen: No abdominal pain, no vomiting, No diarrhea Endocrine: No weight loss, No night sweats Extremities: No peripheral edema, cyanosis Skin: No rashes, easy bruising Neurologic: No focal weakness, trouble with speech or swollowing Urologic: No dysuria, Hematuria, or urinary frequency   Physical Exam:  ED Triage Vitals  Enc Vitals Group     BP 06/10/15 1046 128/87 mmHg     Pulse Rate 06/10/15 1046 52     Resp 06/10/15 1046 22     Temp 06/10/15 1046 97.8 F (36.6 C)     Temp Source 06/10/15 1046 Oral     SpO2 06/10/15 1046 100 %     Weight 06/10/15 1046 145 lb (65.772 kg)     Height 06/10/15 1046 5\' 8"  (1.727 m)     Head Cir --      Peak Flow --      Pain Score --      Pain Loc --      Pain Edu? --      Excl. in [place]? --     General: Awake , Alert , and Oriented times 3; GCS 15 Head: Normal cephalic , atraumatic Eyes: Pupils equal , round, reactive to light Nose/Throat: No nasal drainage, patent upper airway without erythema or exudate.  Neck: Supple, Full range of motion, No anterior adenopathy or palpable thyroid masses Lungs: Clear to ascultation without wheezes , rhonchi, or rales Heart: Regular rate, irregular without murmurs , gallops , or rubs Abdomen: Soft, non tender without rebound, guarding , or rigidity; bowel sounds positive and symmetric in all 4 quadrants. No organomegaly .        Extremities: 2 plus symmetric pulses. No edema, clubbing or cyanosis Neurologic: normal ambulation, Motor symmetric without deficits, sensory intact Skin: warm, dry, no rashes   Labs:   All laboratory work was reviewed including any pertinent negatives or positives listed below:  Labs Reviewed  BASIC METABOLIC PANEL - Abnormal;  Notable for the following:    Potassium 5.2 (*)    CO2 21 (*)    Glucose, Bld 184 (*)    BUN  33 (*)    Creatinine, Ser 1.67 (*)    GFR calc non Af Amer 38 (*)    GFR calc Af Amer 45 (*)    All other components within normal limits  TROPONIN I - Abnormal; Notable for the following:    Troponin I 0.05 (*)    All other components within normal limits  PROTIME-INR - Abnormal; Notable for the following:    Prothrombin Time 40.1 (*)    INR 4.30 (*)    All other components within normal limits  BRAIN NATRIURETIC PEPTIDE - Abnormal; Notable for the following:    B Natriuretic Peptide 231.0 (*)    All other components within normal limits  FIBRIN DERIVATIVES D-DIMER ([facility] ONLY) - Abnormal; Notable for the following:    Fibrin derivatives D-dimer ([facility]) 523 (*)    All other components within normal limits  CBC WITH DIFFERENTIAL/PLATELET - Abnormal; Notable for the following:    RDW 18.6 (*)    All other components within normal limits   patient's D-dimer test is slightly elevated. He also has an elevated INR.  EKG: * ED ECG REPORT I, Daymon Larsen, the attending physician, personally viewed and interpreted this ECG.  Date: 06/10/2015 EKG Time: 1057 Rate: 95 Rhythm: Atrial fibrillation with incomplete right bundle branch block QRS Axis: normal Intervals: normal ST/T Wave abnormalities: normal Conduction Disturbances: none Narrative Interpretation: unremarkable No acute ischemic changes are noted   Radiology:  EXAM: CHEST 2 VIEW  COMPARISON: 05/11/2015  FINDINGS: Normal heart size and mediastinal contours. Nipple shadow noted at the left base, also seen previously and confirmed in the lateral projection. No acute infiltrate or edema. No effusion or pneumothorax. Thoracic spondylosis. No acute osseous findings.  IMPRESSION: No active cardiopulmonary disease.   Electronically Signed By: Monte Fantasia M.D. On: 06/10/2015 11:42    EXAM: CT ANGIOGRAPHY  CHEST WITH CONTRAST  TECHNIQUE: Multidetector CT imaging of the chest was performed using the standard protocol during bolus administration of intravenous contrast. Multiplanar CT image reconstructions and MIPs were obtained to evaluate the vascular anatomy.  CONTRAST: 6mL OMNIPAQUE IOHEXOL 350 MG/ML SOLN  COMPARISON: CT abdomen 12/08/2008  FINDINGS: There are no filling defects in the pulmonary arterial tree to suggest acute pulmonary thromboembolism.  No abnormal mediastinal adenopathy. Three vessel coronary calcifications.  No pneumothorax. No pleural effusion.  3 mm left lower lobe pulmonary nodule on image 93 is stable in retrospect. See image 7 of series 5 on the prior study.  No destructive bone lesion. Postop changes in the region of the stomach are suspected.  Review of the MIP images confirms the above findings.  IMPRESSION: No evidence of acute pulmonary thromboembolism.   Electronically Signed By: Marybelle Killings M.D. On: 06/10/2015 13:45    I personally reviewed the radiologic studies   ED Course: * Patient's stay here was uneventful. Patient does not appear to have a pulmonary embolism and I felt with a negative chest CT we could stop the evaluation at this point. Since INR is elevated and he was advised to hold his Coumadin dosage in the morning and he'll have to have his INR rechecked on Monday. I felt based on his description that the cardiologist likely once he reaches stabilization on his anticoagulation therapy and then attempted elective cardioversion. Patient does not appear to have pulmonary edema other causes first persistent shortness of breath and he currently has rate controlled atrial fibrillation. I felt again we could treat the patient on outpatient basis and he was discharged  with the above recommendation and advised continue all of his other current medications. He was advised by radiology due to the contrast load to hold his  metformin.    Assessment: Dyspnea   Final Clinical Impression: \ Final diagnoses:  Cough  Coumadin toxicity, undetermined intent, initial encounter     Plan:  Outpatient Patient was advised to return immediately if condition worsens. Patient was advised to follow up with their primary care physician or other specialized physicians involved in their outpatient care            Daymon Larsen, MD 06/10/15 1521

## 2015-06-13 NOTE — Progress Notes (Signed)
Cardiology Office Note Date:  06/14/2015  Patient ID:  Gregory, Tyler [birth year], MRN [id number] PCP:  [name], MD  Cardiologist:  Dr. Rockey Situ, MD    Chief Complaint: ED follow up  History of Present Illness: Gregory Tyler is a 75 y.o. male with history of CAD with prior cath in 02/2014 not available, chronic Afib since at least 01/2014 on Xarelto, chronic combined CHF, emphysema, HTN, orthostatic hypotension, prostate cancer, and DM2 who presents for ED follow of acute renal failure 2/2 dehydration.   Recently admitted in early January for acute combined CHF and Afib with RVR and in mid January with Afib with RVR, found to be volume depleted. During his admission in early January he was started on diltiazem for rate control and started on Lasix for diuresis. Echo showed EF of 35-40%, PASP 40 mm Hg. He was started on Xarelto for anticoagulation. In hospital follow up with Dr. Rockey Situ on 05/09/2015 he reported a weight of 168 upon arriving home on 1/4 from the hospital, prior weight of 182 upon admission to [facility] on 1/1. At his OV on 1/16 he weighed 150 pounds. His BP had been running low and was 86/62 at his OV on 1/16. At that time his Lasix and Imdur were held. He reported decreased PO intake. Labs showed a K+ of 6.9, though there were hemolyzed cells, SCr 2.27, BUN 67. Recheck K+ showed a K+ of 6.3, SCr of 2.49, BUN of 67. He was advised to go to the ED. Instead he decided to wait and went to his PCP's office on 1/18 and had a repeat bmet that showed a K+ of 5.7 with a SCr of 2.2, BUN 59. He was again advised to go to the ED. Upon his admission to the ED on 1/18 he was weak and dizzy. He was in Afib with RR with HR in the 140's to 150's. His admission weight was stable at 150. SCr was 2.19-->1.63, BUN 62, K+ 5.7 which improved prior to discharge to 4.8. He was rate controlled and received IV hydration with improvement in his symptoms. It was felt he was dehyrated and did not need  Lasix on a daily basis.   At his hospital follow up on 2/16 he continued to note SOB, mostly with exertion. This was not new and unchanged from prior. No chest pain. His weight continued to decline on his at home weight charts with a home weight stable at 145 on 2/16. He was not eating much. He reported dark urine. No orthopnea or lower extremity swelling. He denies any tachy-palpitations. He has been taking his Xarelto as directed and has not missed any doses. He has not needed to take any lasix since his discharge. His home oxygen has been drying his nasal passages leading to epistaxis at times. Hospital follow up labs were checked and resulted on 2/17 and he was found to be hyperkalemic at 6.3 in the setting of acute on CKD stage III. His INR was also elevated at 1.50, though he is not on Coumadin. He was sent to the ED for further evaluation and treatment of his acute on CKD and hyperkalemia. BMET showed a slightly down trending SCr at 1.67 with a K+ of 5.2, BNP 231. INR further increased to 4.30. CXR was negative. D-dimer was mildly elevated at 523, thus he had a CTA chest that was negative for PE. In the ED it was felt like he had cough and Coumadin toxicity with further increase in  his INR to 4.30. Of note, the patient is not on Coumadin, he is on Xarelto and patient never noted a cough. He was advised to hold Coumadin, a medication which he does not take. No interventions were noted in the ED. He was discharged with outpatient follow up.   He held a dose of Xarelto after his ED visit, but has since resumed this medication. His SOB is unchanged. His weight on his home scale is unchanged and has been 145 since he was last seen here until today when his weight was 144 pounds. He denies any cough, chest pain, palpitations, lower extremity edema, orthopnea, or early satiety. He is trying to eat better and slightly increase his fluids. He has been scheduled for outpatient DCCV on 2/24 previously based on his  last cardiology office visit.   Past Medical History  Diagnosis Date  . Prostate cancer ([place])   . Arthritis   . Diabetes mellitus without complication ([place])   . Hypertension   . CHF (congestive heart failure) (HCC)     Combined Systolic and Diastolic; EF 54-65% in 68/1275  . Emphysema lung ([place])   . Hyperlipidemia   . Gout   . Gastric ulcer   . Anemia   . Nephrolithiasis   . Coronary artery disease   . Anxiety   . Depression   . Chronic atrial fibrillation (HCC)     a. on Xarelto  . Shortness of breath dyspnea     Past Surgical History  Procedure Laterality Date  . Cholecystectomy    . Appendectomy    . Gastric bypass    . Hemorrhoid surgery    . Back surgery    . Pilonidal cyst      Current Outpatient Prescriptions  Medication Sig Dispense Refill  . allopurinol (ZYLOPRIM) 300 MG tablet Take 300 mg by mouth daily.     Marland Kitchen BAYER CONTOUR TEST test strip as directed.  12  . BAYER MICROLET LANCETS lancets 2 (two) times daily. for testing  0  . Blood Glucose Monitoring Suppl (BAYER CONTOUR MONITOR) w/Device KIT 2 (two) times daily. for testing  0  . budesonide-formoterol (SYMBICORT) 160-4.5 MCG/ACT inhaler Inhale 2 puffs into the lungs 2 (two) times daily.    Marland Kitchen diltiazem (CARDIZEM CD) 120 MG 24 hr capsule Take 1 capsule (120 mg total) by mouth daily. 60 capsule 1  . HYDROcodone-acetaminophen (NORCO/VICODIN) 5-325 MG per tablet Take 1 tablet by mouth every 4 (four) hours as needed for moderate pain.     . magnesium oxide (MAG-OX) 400 MG tablet Take 400 mg by mouth daily.     . metFORMIN (GLUCOPHAGE) 500 MG tablet Take 500 mg by mouth 2 (two) times daily.     . metoprolol succinate (TOPROL-XL) 50 MG 24 hr tablet Take 1 tablet (50 mg total) by mouth daily. Take with or immediately following a meal. 30 tablet 11  . Rivaroxaban (XARELTO) 15 MG TABS tablet Take 1 tablet (15 mg total) by mouth daily. 60 tablet 1  . rosuvastatin (CRESTOR) 20 MG tablet Take 1 tablet (20 mg total) by  mouth daily at 6 PM. 60 tablet 1   No current facility-administered medications for this visit.    Allergies:   Aspirin and Codeine sulfate   Social History:  The patient  reports that he has quit smoking. He uses smokeless tobacco. He reports that he does not drink alcohol or use illicit drugs.   Family History:  The patient's family history includes CAD in  his mother; Diabetes in his other; Stroke in his mother.  ROS:   Review of Systems  Constitutional: Positive for malaise/fatigue. Negative for fever, chills, weight loss and diaphoresis.  HENT: Negative for congestion.   Eyes: Negative for discharge and redness.  Respiratory: Positive for shortness of breath. Negative for cough, hemoptysis, sputum production and wheezing.   Cardiovascular: Negative for chest pain, palpitations, orthopnea, claudication, leg swelling and PND.  Gastrointestinal: Negative for heartburn, nausea, vomiting, abdominal pain, blood in stool and melena.  Musculoskeletal: Negative for myalgias and falls.  Skin: Negative for rash.  Neurological: Negative for dizziness, tingling, tremors, sensory change, speech change, focal weakness, seizures, loss of consciousness and weakness.  Endo/Heme/Allergies: Does not bruise/bleed easily.  Psychiatric/Behavioral: Negative for substance abuse. The patient is not nervous/anxious.   All other systems reviewed and are negative.    PHYSICAL EXAM:  VS:  BP 98/74 mmHg  Pulse 85  Ht '5\' 8"'$  (1.727 m)  Wt 154 lb 12.8 oz (70.217 kg)  BMI 23.54 kg/m2 BMI: Body mass index is 23.54 kg/(m^2). Well nourished, well developed, in no acute distress HEENT: normocephalic, atraumatic Neck: no JVD, carotid bruits or masses Cardiac: irregularly-irregular, S1, S2; RRR; no murmurs, rubs, or gallops Lungs:  clear to auscultation bilaterally, no wheezing, rhonchi or rales Abd: soft, nontender, no hepatomegaly, + BS MS: no deformity or atrophy Ext: no edema Skin: warm and dry, no  rash Neuro:  moves all extremities spontaneously, no focal abnormalities noted, follows commands Psych: euthymic mood, full affect   EKG:  Was ordered today. Shows Afib, 85 bpm, peaked T waves, lateral TWI  Recent Labs: 05/11/2015: ALT 48 05/12/2015: TSH 1.925 06/10/2015: B Natriuretic Peptide 231.0* 06/14/2015: BUN 38*; Creatinine, Ser 1.83*; Hemoglobin 13.8; Platelets 225; Potassium 5.2*; Sodium 134*  No results found for requested labs within last 365 days.   Estimated Creatinine Clearance: 33.7 mL/min (by C-G formula based on Cr of 1.83).   Wt Readings from Last 3 Encounters:  06/14/15 154 lb 12.8 oz (70.217 kg)  06/10/15 145 lb (65.772 kg)  06/09/15 153 lb (69.4 kg)     Other studies reviewed: Additional studies/records reviewed today include: summarized above  ASSESSMENT AND PLAN:  1. Chronic Afib: He is currently rate controlled. It is unclear why the patient was told to hold Coumadin in the ED on 2/17 leading to him missing one dose just prior to his DCCV when he has not been on Coumadin. INR levels are not accurate or diagnostic in a patient on Xarelto. Because of this missed dose he will now have to undergo a TEE/DCCV on 06/17/15 to ensure there is not a clot in the left atrial appendage. He has resumed his Xarelto 15 mg q dinner based on his CrCl of 34.46 mL/min after missing that one dose and is tolerating this without any issues. His CBC is stable and he does not have any bruising. CHADS2VASc at least 4 (CHF, HTN, age x 2).   2. Chronic combined CHF: He does not appear to be volume overloaded at this time.   3. CKD stage III: Has had issues with hyperkalemia given dehydration and acute on chronic kidney injury. He has been working on hydration. BMET is pending. If he is found to be severely hyperkalemic today he states he will not go to the ED. If he needs inpatient treatment he would then be directly admitted. Metformin has been held. He needs a nephrologist.   4. Elevated  INR: His elevated INR's are non-diagnostic given  that he is on Xarelto 15 mg q dinner. He is not on Coumadin.   5. CAD: No symptoms concerning for angina at this time. Continue current medications. On Xarelto in place of aspirin. No plans for ischemic work up at this time.   6. Emphysema/SOB: He may benefit from a pulmonary evaluation. I suspect his SOB is multifactorial including his chronic Afib, HF, and emphysema. He does have poor insight into all of these issues. Continue inhalers per PCP.   7. HTN: Well controlled. Decrease Toprol-XL to 25 mg daily given his slightly soft BP today. Continue Cardizem as above.   8. Malnutrition: He is working to increase his po food consumption and supplementing with Boost or Ensure.   Disposition: F/u with Dr. Fletcher Anon, MD status post cardioversion on 06/17/15  Current medicines are reviewed at length with the patient today.  The patient did not have any concerns regarding medicines.  Melvern Banker PA-C 06/14/2015 3:42 PM     [address] [place] [place], [place] [address] [phone]

## 2015-06-14 ENCOUNTER — Telehealth: Payer: Self-pay

## 2015-06-14 ENCOUNTER — Ambulatory Visit (INDEPENDENT_AMBULATORY_CARE_PROVIDER_SITE_OTHER): Payer: Medicare Other | Admitting: Physician Assistant

## 2015-06-14 ENCOUNTER — Encounter: Payer: Self-pay | Admitting: Physician Assistant

## 2015-06-14 ENCOUNTER — Other Ambulatory Visit
Admission: RE | Admit: 2015-06-14 | Discharge: 2015-06-14 | Disposition: A | Discharge status: Home / Self Care | Payer: Medicare Other | Source: Ambulatory Visit | Attending: Cardiovascular Disease | Admitting: Cardiovascular Disease

## 2015-06-14 VITALS — BP 98/74 | HR 85 | Ht 68.0 in | Wt 154.8 lb

## 2015-06-14 DIAGNOSIS — Z7901 Long term (current) use of anticoagulants: Secondary | ICD-10-CM | POA: Diagnosis not present

## 2015-06-14 DIAGNOSIS — T8182XD Emphysema (subcutaneous) resulting from a procedure, subsequent encounter: Secondary | ICD-10-CM

## 2015-06-14 DIAGNOSIS — E875 Hyperkalemia: Secondary | ICD-10-CM

## 2015-06-14 DIAGNOSIS — Z01818 Encounter for other preprocedural examination: Secondary | ICD-10-CM | POA: Insufficient documentation

## 2015-06-14 DIAGNOSIS — Z01812 Encounter for preprocedural laboratory examination: Secondary | ICD-10-CM | POA: Diagnosis not present

## 2015-06-14 DIAGNOSIS — I482 Chronic atrial fibrillation, unspecified: Secondary | ICD-10-CM

## 2015-06-14 DIAGNOSIS — I2510 Atherosclerotic heart disease of native coronary artery without angina pectoris: Secondary | ICD-10-CM

## 2015-06-14 DIAGNOSIS — N183 Chronic kidney disease, stage 3 unspecified: Secondary | ICD-10-CM

## 2015-06-14 DIAGNOSIS — I5042 Chronic combined systolic (congestive) and diastolic (congestive) heart failure: Secondary | ICD-10-CM

## 2015-06-14 DIAGNOSIS — I10 Essential (primary) hypertension: Secondary | ICD-10-CM

## 2015-06-14 LAB — BASIC METABOLIC PANEL
Anion gap: 8 (ref 5–15)
BUN: 38 mg/dL — AB (ref 6–20)
CALCIUM: 9.5 mg/dL (ref 8.9–10.3)
CHLORIDE: 106 mmol/L (ref 101–111)
CO2: 20 mmol/L — ABNORMAL LOW (ref 22–32)
CREATININE: 1.83 mg/dL — AB (ref 0.61–1.24)
GFR calc Af Amer: 40 mL/min — ABNORMAL LOW (ref >60)
GFR calc non Af Amer: 34 mL/min — ABNORMAL LOW (ref >60)
Glucose, Bld: 140 mg/dL — ABNORMAL HIGH (ref 65–99)
Potassium: 5.2 mmol/L — ABNORMAL HIGH (ref 3.5–5.1)
SODIUM: 134 mmol/L — AB (ref 135–145)

## 2015-06-14 LAB — CBC
HCT: 42.1 % (ref 40.0–52.0)
Hemoglobin: 13.8 g/dL (ref 13.0–18.0)
MCH: 29.1 pg (ref 26.0–34.0)
MCHC: 32.8 g/dL (ref 32.0–36.0)
MCV: 88.8 fL (ref 80.0–100.0)
Platelets: 225 10*3/uL (ref 150–440)
RBC: 4.74 MIL/uL (ref 4.40–5.90)
RDW: 19 % — ABNORMAL HIGH (ref 11.5–14.5)
WBC: 7.2 10*3/uL (ref 3.8–10.6)

## 2015-06-14 LAB — PROTIME-INR
INR: 3.18
PROTHROMBIN TIME: 32 s — AB (ref 11.4–15.0)

## 2015-06-14 NOTE — Telephone Encounter (Signed)
Pt TEE and DCCV time changed to 8:30am, arrival 7:30am S/w pt of new time. Pt verbalized understanding and is agreeable w/plan.

## 2015-06-14 NOTE — Patient Instructions (Addendum)
Medication Instructions:  Your physician recommends that you continue on your current medications as directed. Please refer to the Current Medication list given to you today.   Labwork: BMET, CBC, PT/INR at [facility] lab today  Testing/Procedures: Your physician has requested that you have a TEE. During a TEE, sound waves are used to create images of your heart. It provides your doctor with information about the size and shape of your heart and how well your heart's chambers and valves are working. In this test, a transducer is attached to the end of a flexible tube that's guided down your throat and into your esophagus (the tube leading from you mouth to your stomach) to get a more detailed image of your heart. You are not awake for the procedure. Please see the instruction sheet given to you today. For further information please visit [email or web address].  Friday, Feb 24 before your cardioversion Please refer to instructions given to your for your cardioversion   Follow-Up: Keep your followup appointment with Dr. Fletcher Anon.   Any Other Special Instructions Will Be Listed Below (If Applicable).     If you need a refill on your cardiac medications before your next appointment, please call your pharmacy.  Transesophageal Echocardiogram Transesophageal echocardiography (TEE) is a special type of test that produces images of the heart by using sound waves (echocardiogram). This type of echocardiography can obtain better images of the heart than standard echocardiography. TEE is done by passing a flexible tube down the esophagus. The heart is located in front of the esophagus. Because the heart and esophagus are close to one another, your health care provider can take very clear, detailed pictures of the heart via ultrasound waves. TEE may be done:  If your health care provider needs more information based on standard echocardiography findings.  If you had a stroke. This might have happened because  a clot formed in your heart. TEE can visualize different areas of the heart and check for clots.  To check valve anatomy and function.  To check for infection on the inside of your heart (endocarditis).  To evaluate the dividing wall (septum) of the heart and presence of a hole that did not close after birth (patent foramen ovale or atrial septal defect).  To help diagnose a tear in the wall of the aorta (aortic dissection).  During cardiac valve surgery. This allows the surgeon to assess the valve repair before closing the chest.  During a variety of other cardiac procedures to guide positioning of catheters.  Sometimes before a cardioversion, which is a shock to convert heart rhythm back to normal. LET [facility] CARE PROVIDER KNOW ABOUT:   Any allergies you have.  All medicines you are taking, including vitamins, herbs, eye drops, creams, and over-the-counter medicines.  Previous problems you or members of your family have had with the use of anesthetics.  Any blood disorders you have.  Previous surgeries you have had.  Medical conditions you have.  Swallowing difficulties.  An esophageal obstruction. RISKS AND COMPLICATIONS  Generally, TEE is a safe procedure. However, as with any procedure, complications can occur. Possible complications include an esophageal tear (rupture). BEFORE THE PROCEDURE   Do not eat or drink for 6 hours before the procedure or as directed by your health care provider.  Arrange for someone to drive you home after the procedure. Do not drive yourself home. During the procedure, you will be given medicines that can continue to make you feel drowsy and can impair your reflexes.  An IV access tube will be started in the arm. PROCEDURE   A medicine to help you relax (sedative) will be given through the IV access tube.  A medicine may be sprayed or gargled to numb the back of the throat.  Your blood pressure, heart rate, and breathing (vital  signs) will be monitored during the procedure.  The TEE probe is a long, flexible tube. The tip of the probe is placed into the back of the mouth, and you will be asked to swallow. This helps to pass the tip of the probe into the esophagus. Once the tip of the probe is in the correct area, your health care provider can take pictures of the heart.  TEE is usually not a painful procedure. You may feel the probe press against the back of the throat. The probe does not enter the trachea and does not affect your breathing. AFTER THE PROCEDURE   You will be in bed, resting, until you have fully returned to consciousness.  When you first awaken, your throat may feel slightly sore and will probably still feel numb. This will improve slowly over time.  You will not be allowed to eat or drink until it is clear that the numbness has improved.  Once you have been able to drink, urinate, and sit on the edge of the bed without feeling sick to your stomach (nausea) or dizzy, you may be cleared to go home.  You should have a friend or family member with you for the next 24 hours after your procedure.   This information is not intended to replace advice given to you by your health care provider. Make sure you discuss any questions you have with your health care provider.   Document Released: 06/30/2002 Document Revised: 04/14/2013 Document Reviewed: 10/09/2012 Elsevier Interactive Patient Education [employer].

## 2015-06-15 ENCOUNTER — Ambulatory Visit: Payer: Medicare Other | Attending: Family | Admitting: Family

## 2015-06-15 ENCOUNTER — Telehealth: Payer: Self-pay

## 2015-06-15 ENCOUNTER — Ambulatory Visit: Payer: Medicare Other | Admitting: Physician Assistant

## 2015-06-15 ENCOUNTER — Encounter: Payer: Self-pay | Admitting: Family

## 2015-06-15 ENCOUNTER — Other Ambulatory Visit: Payer: Self-pay

## 2015-06-15 VITALS — BP 132/89 | HR 98 | Resp 18 | Ht 68.0 in | Wt 154.0 lb

## 2015-06-15 DIAGNOSIS — I5022 Chronic systolic (congestive) heart failure: Secondary | ICD-10-CM | POA: Diagnosis not present

## 2015-06-15 DIAGNOSIS — F419 Anxiety disorder, unspecified: Secondary | ICD-10-CM | POA: Insufficient documentation

## 2015-06-15 DIAGNOSIS — Z8249 Family history of ischemic heart disease and other diseases of the circulatory system: Secondary | ICD-10-CM | POA: Diagnosis not present

## 2015-06-15 DIAGNOSIS — E119 Type 2 diabetes mellitus without complications: Secondary | ICD-10-CM | POA: Insufficient documentation

## 2015-06-15 DIAGNOSIS — F329 Major depressive disorder, single episode, unspecified: Secondary | ICD-10-CM | POA: Insufficient documentation

## 2015-06-15 DIAGNOSIS — Z833 Family history of diabetes mellitus: Secondary | ICD-10-CM | POA: Insufficient documentation

## 2015-06-15 DIAGNOSIS — D649 Anemia, unspecified: Secondary | ICD-10-CM | POA: Diagnosis not present

## 2015-06-15 DIAGNOSIS — I482 Chronic atrial fibrillation, unspecified: Secondary | ICD-10-CM

## 2015-06-15 DIAGNOSIS — Z87891 Personal history of nicotine dependence: Secondary | ICD-10-CM | POA: Insufficient documentation

## 2015-06-15 DIAGNOSIS — I10 Essential (primary) hypertension: Secondary | ICD-10-CM | POA: Diagnosis not present

## 2015-06-15 DIAGNOSIS — Z8719 Personal history of other diseases of the digestive system: Secondary | ICD-10-CM | POA: Insufficient documentation

## 2015-06-15 DIAGNOSIS — I2510 Atherosclerotic heart disease of native coronary artery without angina pectoris: Secondary | ICD-10-CM | POA: Insufficient documentation

## 2015-06-15 DIAGNOSIS — E785 Hyperlipidemia, unspecified: Secondary | ICD-10-CM | POA: Diagnosis not present

## 2015-06-15 DIAGNOSIS — R0602 Shortness of breath: Secondary | ICD-10-CM | POA: Insufficient documentation

## 2015-06-15 DIAGNOSIS — J439 Emphysema, unspecified: Secondary | ICD-10-CM | POA: Diagnosis not present

## 2015-06-15 DIAGNOSIS — Z8546 Personal history of malignant neoplasm of prostate: Secondary | ICD-10-CM | POA: Insufficient documentation

## 2015-06-15 DIAGNOSIS — Z7901 Long term (current) use of anticoagulants: Secondary | ICD-10-CM | POA: Insufficient documentation

## 2015-06-15 DIAGNOSIS — N179 Acute kidney failure, unspecified: Secondary | ICD-10-CM

## 2015-06-15 DIAGNOSIS — Z823 Family history of stroke: Secondary | ICD-10-CM | POA: Insufficient documentation

## 2015-06-15 DIAGNOSIS — Z87442 Personal history of urinary calculi: Secondary | ICD-10-CM | POA: Insufficient documentation

## 2015-06-15 DIAGNOSIS — M109 Gout, unspecified: Secondary | ICD-10-CM | POA: Diagnosis not present

## 2015-06-15 DIAGNOSIS — I509 Heart failure, unspecified: Secondary | ICD-10-CM | POA: Diagnosis present

## 2015-06-15 NOTE — Patient Instructions (Signed)
Continue weighing daily and call for an overnight weight gain of > 2 pounds or a weekly weight gain of >5 pounds. 

## 2015-06-15 NOTE — Progress Notes (Signed)
Subjective:    Patient ID: Gregory Tyler, male    DOB: [birth year], 75 y.o.   MRN: [id number]  Congestive Heart Failure Presents for follow-up visit. The disease course has been stable. Associated symptoms include fatigue and shortness of breath (worse in the morning). Pertinent negatives include no abdominal pain, chest pain, edema, orthopnea or palpitations. The symptoms have been stable. Past treatments include beta blockers and salt and fluid restriction. The treatment provided mild relief. Compliance with prior treatments has been good. His past medical history is significant for anemia, arrhythmia, CAD, chronic lung disease, DM and HTN. He has one 1st degree relative with heart disease. Compliance with total regimen is 76-100%.  Hypertension This is a chronic problem. The current episode started more than 1 year ago. The problem is controlled. Associated symptoms include malaise/fatigue and shortness of breath (worse in the morning). Pertinent negatives include no chest pain, headaches, palpitations or peripheral edema. There are no associated agents to hypertension. Risk factors for coronary artery disease include diabetes mellitus, family history and male gender. Past treatments include beta blockers, calcium channel blockers, diuretics and lifestyle changes. The current treatment provides moderate improvement. Compliance problems include diet.  Hypertensive end-organ damage includes heart failure.    Past Medical History  Diagnosis Date  . Prostate cancer ([place])   . Arthritis   . Diabetes mellitus without complication ([place])   . Hypertension   . CHF (congestive heart failure) (H[facility])     Combined Systolic and Diastolic; EF 03-50% in 12/3816  . Emphysema lung ([place])   . Hyperlipidemia   . Gout   . Gastric ulcer   . Anemia   . Nephrolithiasis   . Coronary artery disease   . Anxiety   . Depression   . Chronic atrial fibrillation (H[facility])     a. on Xarelto  . Shortness of breath  dyspnea     Past Surgical History  Procedure Laterality Date  . Cholecystectomy    . Appendectomy    . Gastric bypass    . Hemorrhoid surgery    . Back surgery    . Pilonidal cyst      Family History  Problem Relation Age of Onset  . CAD Mother   . Stroke Mother   . Diabetes Other     Social History  Substance Use Topics  . Smoking status: Former Research scientist (life sciences)  . Smokeless tobacco: Current User  . Alcohol Use: No    Allergies  Allergen Reactions  . Aspirin     GI Bleeding  . Codeine Sulfate     Other reaction(s): Other (See Comments) BLISTERS    Prior to Admission medications   Medication Sig Start Date End Date Taking? Authorizing Provider  allopurinol (ZYLOPRIM) 300 MG tablet Take 300 mg by mouth daily.    Yes Historical Provider, MD  BAYER CONTOUR TEST test strip as directed. 05/17/15  Yes Historical Provider, MD  BAYER MICROLET LANCETS lancets 2 (two) times daily. for testing 05/13/15  Yes Historical Provider, MD  Blood Glucose Monitoring Suppl (BAYER CONTOUR MONITOR) w/Device KIT 2 (two) times daily. for testing 05/13/15  Yes Historical Provider, MD  budesonide-formoterol (SYMBICORT) 160-4.5 MCG/ACT inhaler Inhale 2 puffs into the lungs 2 (two) times daily.   Yes Historical Provider, MD  diltiazem (CARDIZEM CD) 120 MG 24 hr capsule Take 1 capsule (120 mg total) by mouth daily. 04/27/15  Yes Henreitta Leber, MD  HYDROcodone-acetaminophen (NORCO/VICODIN) 5-325 MG per tablet Take 1 tablet by mouth every 4 (  four) hours as needed for moderate pain.    Yes Historical Provider, MD  magnesium oxide (MAG-OX) 400 MG tablet Take 400 mg by mouth daily.    Yes Historical Provider, MD  metoprolol succinate (TOPROL-XL) 50 MG 24 hr tablet Take 1 tablet (50 mg total) by mouth daily. Take with or immediately following a meal. 06/09/15  Yes Ryan M Dunn, PA-C  Rivaroxaban (XARELTO) 15 MG TABS tablet Take 1 tablet (15 mg total) by mouth daily. 04/27/15  Yes Henreitta Leber, MD  rosuvastatin  (CRESTOR) 20 MG tablet Take 1 tablet (20 mg total) by mouth daily at 6 PM. 04/27/15  Yes Henreitta Leber, MD     Review of Systems  Constitutional: Positive for malaise/fatigue and fatigue. Negative for appetite change.  HENT: Positive for nosebleeds (infrequent). Negative for congestion, postnasal drip and sore throat.   Eyes: Positive for visual disturbance (blurry vision). Negative for itching.  Respiratory: Positive for shortness of breath (worse in the morning). Negative for cough, chest tightness and wheezing.   Cardiovascular: Negative for chest pain, palpitations and leg swelling.  Gastrointestinal: Negative for abdominal pain and abdominal distention.  Endocrine: Negative.   Genitourinary: Negative.   Musculoskeletal: Negative.   Skin: Negative.   Allergic/Immunologic: Negative.   Neurological: Positive for dizziness (when changing positions quickly), weakness and light-headedness. Negative for headaches.  Hematological: Negative for adenopathy. Bruises/bleeds easily.  Psychiatric/Behavioral: Negative for sleep disturbance and dysphoric mood. The patient is nervous/anxious.        Objective:   Physical Exam  Constitutional: He is oriented to person, place, and time. He appears well-developed and well-nourished.  HENT:  Head: Normocephalic and atraumatic.  Eyes: Conjunctivae are normal. Pupils are equal, round, and reactive to light.  Neck: Normal range of motion. Neck supple.  Cardiovascular: An irregular rhythm present. Tachycardia present.   Pulmonary/Chest: Effort normal. He has no wheezes. He has no rales.  Abdominal: Soft. He exhibits no distension. There is no tenderness.  Musculoskeletal: He exhibits no edema or tenderness.  Neurological: He is alert and oriented to person, place, and time.  Skin: Skin is warm and dry.  Psychiatric: He has a normal mood and affect. His behavior is normal. Thought content normal.  Nursing note and vitals reviewed.   BP 132/89 mmHg   Pulse 98  Resp 18  Ht _0  (1.727 m)  Wt 154 lb (69.854 kg)  BMI 23.42 kg/m2  SpO2 98%       Assessment & Plan:  1: Chronic heart failure with reduced ejection fraction- Patient presents with fatigue and shortness of breath with little exertion. He says that he was short of breath upon walking into the office today but once he sat down for a few minutes to rest, his breathing improved. Denies any swelling in his legs or abdomen. Continues to weigh daily and notes a gradual weight loss. By our scale, he's lost 7 pounds since he was last here on 05/19/15. Reminded to call for an overnight weight gain of >2 pounds or a weekly weight gain of >5 pounds. He is not adding any salt to his food and is trying to eat low sodium foods but says that he's really not very hungry anymore. Could consider adding entresto but he's not interested at this time so will continue to discuss at future visits. [facility] went in and reviewed medications with the patient.  2: Atrial fibrillation- He is going to have to have a TEE on 06/16/15 due to him  missing one dose of xarelto due to an ER visit. He is supposed to be having a cardioversion soon but now has to have the TEE first.  3: HTN- Blood pressure has recovered nicely since stopping the isosorbide. He does report some dizziness when changing positions quickly or when first getting up in the mornings. Would like a walker for stability so a prescription was written for a rollator walker with seat and brakes.   Return here in 3 months or sooner for any questions/problems before then.

## 2015-06-15 NOTE — Telephone Encounter (Signed)
S/w Mandi at [employer]. Per Christell Faith, PA-C referral, pt has appt Monday Feb 27, 11am. Office notes and labs sent to [phone] S/w pt with regarding appt. Pt agreeable with plan

## 2015-06-17 ENCOUNTER — Ambulatory Visit: Payer: Medicare Other | Admitting: Anesthesiology

## 2015-06-17 ENCOUNTER — Encounter: Payer: Self-pay | Admitting: *Deleted

## 2015-06-17 ENCOUNTER — Ambulatory Visit (HOSPITAL_BASED_OUTPATIENT_CLINIC_OR_DEPARTMENT_OTHER)
Admission: RE | Admit: 2015-06-17 | Discharge: 2015-06-17 | Disposition: A | Discharge status: Home / Self Care | Payer: Medicare Other | Source: Ambulatory Visit | Attending: Cardiovascular Disease | Admitting: Cardiovascular Disease

## 2015-06-17 ENCOUNTER — Encounter: Admission: RE | Payer: Self-pay | Source: Ambulatory Visit

## 2015-06-17 ENCOUNTER — Ambulatory Visit
Admission: RE | Admit: 2015-06-17 | Discharge: 2015-06-17 | Disposition: A | Discharge status: Home / Self Care | Payer: Medicare Other | Source: Ambulatory Visit | Attending: Cardiovascular Disease | Admitting: Cardiovascular Disease

## 2015-06-17 ENCOUNTER — Ambulatory Visit: Admission: RE | Admit: 2015-06-17 | Payer: Medicare Other | Source: Ambulatory Visit | Admitting: Cardiovascular Disease

## 2015-06-17 ENCOUNTER — Encounter
Admission: RE | Disposition: A | Discharge status: Home / Self Care | Payer: Self-pay | Source: Ambulatory Visit | Attending: Cardiovascular Disease

## 2015-06-17 DIAGNOSIS — Z7982 Long term (current) use of aspirin: Secondary | ICD-10-CM | POA: Diagnosis not present

## 2015-06-17 DIAGNOSIS — I110 Hypertensive heart disease with heart failure: Secondary | ICD-10-CM | POA: Diagnosis not present

## 2015-06-17 DIAGNOSIS — R0602 Shortness of breath: Secondary | ICD-10-CM | POA: Diagnosis not present

## 2015-06-17 DIAGNOSIS — I2510 Atherosclerotic heart disease of native coronary artery without angina pectoris: Secondary | ICD-10-CM | POA: Insufficient documentation

## 2015-06-17 DIAGNOSIS — Z886 Allergy status to analgesic agent status: Secondary | ICD-10-CM | POA: Diagnosis not present

## 2015-06-17 DIAGNOSIS — I4819 Other persistent atrial fibrillation: Secondary | ICD-10-CM | POA: Insufficient documentation

## 2015-06-17 DIAGNOSIS — I482 Chronic atrial fibrillation: Secondary | ICD-10-CM | POA: Insufficient documentation

## 2015-06-17 DIAGNOSIS — I951 Orthostatic hypotension: Secondary | ICD-10-CM | POA: Insufficient documentation

## 2015-06-17 DIAGNOSIS — I5042 Chronic combined systolic (congestive) and diastolic (congestive) heart failure: Secondary | ICD-10-CM | POA: Insufficient documentation

## 2015-06-17 DIAGNOSIS — Z9884 Bariatric surgery status: Secondary | ICD-10-CM | POA: Diagnosis not present

## 2015-06-17 DIAGNOSIS — D649 Anemia, unspecified: Secondary | ICD-10-CM | POA: Diagnosis not present

## 2015-06-17 DIAGNOSIS — J439 Emphysema, unspecified: Secondary | ICD-10-CM | POA: Diagnosis not present

## 2015-06-17 DIAGNOSIS — Z87442 Personal history of urinary calculi: Secondary | ICD-10-CM | POA: Insufficient documentation

## 2015-06-17 DIAGNOSIS — Z79899 Other long term (current) drug therapy: Secondary | ICD-10-CM | POA: Diagnosis not present

## 2015-06-17 DIAGNOSIS — Z885 Allergy status to narcotic agent status: Secondary | ICD-10-CM | POA: Insufficient documentation

## 2015-06-17 DIAGNOSIS — Z7901 Long term (current) use of anticoagulants: Secondary | ICD-10-CM | POA: Diagnosis not present

## 2015-06-17 DIAGNOSIS — Z8546 Personal history of malignant neoplasm of prostate: Secondary | ICD-10-CM | POA: Insufficient documentation

## 2015-06-17 DIAGNOSIS — F418 Other specified anxiety disorders: Secondary | ICD-10-CM | POA: Insufficient documentation

## 2015-06-17 DIAGNOSIS — F329 Major depressive disorder, single episode, unspecified: Secondary | ICD-10-CM | POA: Diagnosis not present

## 2015-06-17 DIAGNOSIS — Z7984 Long term (current) use of oral hypoglycemic drugs: Secondary | ICD-10-CM | POA: Diagnosis not present

## 2015-06-17 DIAGNOSIS — E785 Hyperlipidemia, unspecified: Secondary | ICD-10-CM | POA: Diagnosis not present

## 2015-06-17 DIAGNOSIS — R0600 Dyspnea, unspecified: Secondary | ICD-10-CM | POA: Diagnosis not present

## 2015-06-17 DIAGNOSIS — I481 Persistent atrial fibrillation: Secondary | ICD-10-CM

## 2015-06-17 DIAGNOSIS — E119 Type 2 diabetes mellitus without complications: Secondary | ICD-10-CM | POA: Insufficient documentation

## 2015-06-17 DIAGNOSIS — Z9049 Acquired absence of other specified parts of digestive tract: Secondary | ICD-10-CM | POA: Diagnosis not present

## 2015-06-17 DIAGNOSIS — I4891 Unspecified atrial fibrillation: Secondary | ICD-10-CM

## 2015-06-17 DIAGNOSIS — M1990 Unspecified osteoarthritis, unspecified site: Secondary | ICD-10-CM | POA: Insufficient documentation

## 2015-06-17 HISTORY — PX: TEE WITHOUT CARDIOVERSION: SHX5443

## 2015-06-17 HISTORY — PX: ELECTROPHYSIOLOGIC STUDY: SHX172A

## 2015-06-17 HISTORY — DX: Abnormal weight loss: R63.4

## 2015-06-17 LAB — GLUCOSE, CAPILLARY: Glucose-Capillary: 125 mg/dL — ABNORMAL HIGH (ref 65–99)

## 2015-06-17 SURGERY — CARDIOVERSION (CATH LAB)
Anesthesia: General

## 2015-06-17 SURGERY — ECHOCARDIOGRAM, TRANSESOPHAGEAL
Anesthesia: Moderate Sedation

## 2015-06-17 MED ORDER — PROPOFOL 10 MG/ML IV BOLUS
INTRAVENOUS | Status: DC | PRN
  Administered 2015-06-17 (×3): 20 mg via INTRAVENOUS

## 2015-06-17 MED ORDER — LIDOCAINE VISCOUS 2 % MT SOLN
OROMUCOSAL | Status: AC
  Filled 2015-06-17: qty 15

## 2015-06-17 MED ORDER — BUTAMBEN-TETRACAINE-BENZOCAINE 2-2-14 % EX AERO
INHALATION_SPRAY | CUTANEOUS | Status: AC
  Filled 2015-06-17: qty 20

## 2015-06-17 MED ORDER — SODIUM CHLORIDE 0.9 % IV SOLN
INTRAVENOUS | Status: DC
  Administered 2015-06-17: 08:00:00 via INTRAVENOUS

## 2015-06-17 NOTE — Anesthesia Preprocedure Evaluation (Addendum)
Anesthesia Evaluation  Patient identified by MRN, date of birth, ID band Patient awake    Reviewed: Allergy & Precautions, NPO status , Patient's Chart, lab work & pertinent test results, reviewed documented beta blocker date and time   Airway Mallampati: II  TM Distance: >3 FB     Dental   Pulmonary shortness of breath and with exertion, COPD, former smoker,    Pulmonary exam normal        Cardiovascular hypertension, Pt. on medications and Pt. on home beta blockers + CAD and +CHF  Normal cardiovascular exam+ dysrhythmias Atrial Fibrillation      Neuro/Psych Anxiety Depression    GI/Hepatic PUD,   Endo/Other  diabetes, Well Controlled, Type 2, Oral Hypoglycemic Agents  Renal/GU Renal InsufficiencyRenal diseasestones  negative genitourinary   Musculoskeletal  (+) Arthritis , Osteoarthritis,    Abdominal Normal abdominal exam  (+)   Peds negative pediatric ROS (+)  Hematology  (+) anemia ,   Anesthesia Other Findings   Reproductive/Obstetrics                           Anesthesia Physical Anesthesia Plan  ASA: III  Anesthesia Plan: General   Post-op Pain Management:    Induction: Intravenous  Airway Management Planned: Nasal Cannula  Additional Equipment:   Intra-op Plan:   Post-operative Plan:   Informed Consent: I have reviewed the patients History and Physical, chart, labs and discussed the procedure including the risks, benefits and alternatives for the proposed anesthesia with the patient or authorized representative who has indicated his/her understanding and acceptance.   Dental advisory given  Plan Discussed with: CRNA and Surgeon  Anesthesia Plan Comments:         Anesthesia Quick Evaluation

## 2015-06-17 NOTE — Discharge Instructions (Signed)
Electrical Cardioversion, Care After °Refer to this sheet in the next few weeks. These instructions provide you with information on caring for yourself after your procedure. Your health care provider may also give you more specific instructions. Your treatment has been planned according to current medical practices, but problems sometimes occur. Call your health care provider if you have any problems or questions after your procedure. °WHAT TO EXPECT AFTER THE PROCEDURE °After your procedure, it is typical to have the following sensations: °· Some redness on the skin where the shocks were delivered. If this is tender, a sunburn lotion or hydrocortisone cream may help. °· Possible return of an abnormal heart rhythm within hours or days after the procedure. °HOME CARE INSTRUCTIONS °· Take medicines only as directed by your health care provider. Be sure you understand how and when to take your medicine. °· Learn how to feel your pulse and check it often. °· Limit your activity for 48 hours after the procedure or as directed by your health care provider. °· Avoid or minimize caffeine and other stimulants as directed by your health care provider. °SEEK MEDICAL CARE IF: °· You feel like your heart is beating too fast or your pulse is not regular. °· You have any questions about your medicines. °· You have bleeding that will not stop. °SEEK IMMEDIATE MEDICAL CARE IF: °· You are dizzy or feel faint. °· It is hard to breathe or you feel short of breath. °· There is a change in discomfort in your chest. °· Your speech is slurred or you have trouble moving an arm or leg on one side of your body. °· You get a serious muscle cramp that does not go away. °· Your fingers or toes turn cold or blue. °  °This information is not intended to replace advice given to you by your health care provider. Make sure you discuss any questions you have with your health care provider. °  °Document Released: 01/28/2013 Document Revised: 04/30/2014  Document Reviewed: 01/28/2013 °Elsevier Interactive Patient Education ©2016 Elsevier Inc. ° °

## 2015-06-17 NOTE — Interval H&P Note (Signed)
History and Physical Interval Note:  06/17/2015 9:08 AM  Gregory Tyler  has presented today for surgery, with the diagnosis of A-fib    OK for 8:30a per Denny Peon in OR       TEE added  Called [facility]  The various methods of treatment have been discussed with the patient and family. After consideration of risks, benefits and other options for treatment, the patient has consented to  Procedure(s): CARDIOVERSION (N/A) TRANSESOPHAGEAL ECHOCARDIOGRAM (TEE) (N/A) as a surgical intervention .  The patient's history has been reviewed, patient examined, no change in status, stable for surgery.  I have reviewed the patient's chart and labs.  Questions were answered to the patient's satisfaction.     Gregory Tyler

## 2015-06-17 NOTE — CV Procedure (Signed)
Cardioversion note: A standard informed consent was obtained. Timeout was performed. The pads were placed in the anterior posterior fashion. The patient was given propofol by the anesthesia team. Transesophageal echocardiogram was done which showed no evidence of intracardiac thrombus. Successful cardioversion was performed with a 200 J. The patient converted to sinus rhythm. Pre-and post EKGs were reviewed. The patient tolerated the procedure with no immediate complications.  Recommendations: Continue same medications and follow-up in 2-3 weeks.

## 2015-06-17 NOTE — Anesthesia Postprocedure Evaluation (Signed)
Anesthesia Post Note  Patient: Gregory Tyler  Procedure(s) Performed: Procedure(s) (LRB): CARDIOVERSION (N/A) TRANSESOPHAGEAL ECHOCARDIOGRAM (TEE) (N/A)  Patient location during evaluation: Other Anesthesia Type: General Level of consciousness: awake and alert and oriented Pain management: pain level controlled Vital Signs Assessment: post-procedure vital signs reviewed and stable Respiratory status: spontaneous breathing Cardiovascular status: blood pressure returned to baseline Anesthetic complications: no    Last Vitals:  Filed Vitals:   06/17/15 0915 06/17/15 0930  BP: 108/71 105/69  Pulse: 63 59  Temp:    Resp: 16 14    Last Pain: There were no vitals filed for this visit.               Scotty Weigelt

## 2015-06-17 NOTE — H&P (View-Only) (Signed)
Cardiology Office Note Date:  06/14/2015  Patient ID:  Gregory, Tyler [birth year], MRN [id number] PCP:  [name], MD  Cardiologist:  Dr. Rockey Situ, MD    Chief Complaint: ED follow up  History of Present Illness: KIJANA Tyler is a 75 y.o. male with history of CAD with prior cath in 02/2014 not available, chronic Afib since at least 01/2014 on Xarelto, chronic combined CHF, emphysema, HTN, orthostatic hypotension, prostate cancer, and DM2 who presents for ED follow of acute renal failure 2/2 dehydration.   Recently admitted in early January for acute combined CHF and Afib with RVR and in mid January with Afib with RVR, found to be volume depleted. During his admission in early January he was started on diltiazem for rate control and started on Lasix for diuresis. Echo showed EF of 35-40%, PASP 40 mm Hg. He was started on Xarelto for anticoagulation. In hospital follow up with Dr. Rockey Situ on 05/09/2015 he reported a weight of 168 upon arriving home on 1/4 from the hospital, prior weight of 182 upon admission to [facility] on 1/1. At his OV on 1/16 he weighed 150 pounds. His BP had been running low and was 86/62 at his OV on 1/16. At that time his Lasix and Imdur were held. He reported decreased PO intake. Labs showed a K+ of 6.9, though there were hemolyzed cells, SCr 2.27, BUN 67. Recheck K+ showed a K+ of 6.3, SCr of 2.49, BUN of 67. He was advised to go to the ED. Instead he decided to wait and went to his PCP's office on 1/18 and had a repeat bmet that showed a K+ of 5.7 with a SCr of 2.2, BUN 59. He was again advised to go to the ED. Upon his admission to the ED on 1/18 he was weak and dizzy. He was in Afib with RR with HR in the 140's to 150's. His admission weight was stable at 150. SCr was 2.19-->1.63, BUN 62, K+ 5.7 which improved prior to discharge to 4.8. He was rate controlled and received IV hydration with improvement in his symptoms. It was felt he was dehyrated and did not need  Lasix on a daily basis.   At his hospital follow up on 2/16 he continued to note SOB, mostly with exertion. This was not new and unchanged from prior. No chest pain. His weight continued to decline on his at home weight charts with a home weight stable at 145 on 2/16. He was not eating much. He reported dark urine. No orthopnea or lower extremity swelling. He denies any tachy-palpitations. He has been taking his Xarelto as directed and has not missed any doses. He has not needed to take any lasix since his discharge. His home oxygen has been drying his nasal passages leading to epistaxis at times. Hospital follow up labs were checked and resulted on 2/17 and he was found to be hyperkalemic at 6.3 in the setting of acute on CKD stage III. His INR was also elevated at 1.50, though he is not on Coumadin. He was sent to the ED for further evaluation and treatment of his acute on CKD and hyperkalemia. BMET showed a slightly down trending SCr at 1.67 with a K+ of 5.2, BNP 231. INR further increased to 4.30. CXR was negative. D-dimer was mildly elevated at 523, thus he had a CTA chest that was negative for PE. In the ED it was felt like he had cough and Coumadin toxicity with further increase in  his INR to 4.30. Of note, the patient is not on Coumadin, he is on Xarelto and patient never noted a cough. He was advised to hold Coumadin, a medication which he does not take. No interventions were noted in the ED. He was discharged with outpatient follow up.   He held a dose of Xarelto after his ED visit, but has since resumed this medication. His SOB is unchanged. His weight on his home scale is unchanged and has been 145 since he was last seen here until today when his weight was 144 pounds. He denies any cough, chest pain, palpitations, lower extremity edema, orthopnea, or early satiety. He is trying to eat better and slightly increase his fluids. He has been scheduled for outpatient DCCV on 2/24 previously based on his  last cardiology office visit.   Past Medical History  Diagnosis Date  . Prostate cancer ([place])   . Arthritis   . Diabetes mellitus without complication ([place])   . Hypertension   . CHF (congestive heart failure) (HCC)     Combined Systolic and Diastolic; EF 54-65% in 68/1275  . Emphysema lung ([place])   . Hyperlipidemia   . Gout   . Gastric ulcer   . Anemia   . Nephrolithiasis   . Coronary artery disease   . Anxiety   . Depression   . Chronic atrial fibrillation (HCC)     a. on Xarelto  . Shortness of breath dyspnea     Past Surgical History  Procedure Laterality Date  . Cholecystectomy    . Appendectomy    . Gastric bypass    . Hemorrhoid surgery    . Back surgery    . Pilonidal cyst      Current Outpatient Prescriptions  Medication Sig Dispense Refill  . allopurinol (ZYLOPRIM) 300 MG tablet Take 300 mg by mouth daily.     Marland Kitchen BAYER CONTOUR TEST test strip as directed.  12  . BAYER MICROLET LANCETS lancets 2 (two) times daily. for testing  0  . Blood Glucose Monitoring Suppl (BAYER CONTOUR MONITOR) w/Device KIT 2 (two) times daily. for testing  0  . budesonide-formoterol (SYMBICORT) 160-4.5 MCG/ACT inhaler Inhale 2 puffs into the lungs 2 (two) times daily.    Marland Kitchen diltiazem (CARDIZEM CD) 120 MG 24 hr capsule Take 1 capsule (120 mg total) by mouth daily. 60 capsule 1  . HYDROcodone-acetaminophen (NORCO/VICODIN) 5-325 MG per tablet Take 1 tablet by mouth every 4 (four) hours as needed for moderate pain.     . magnesium oxide (MAG-OX) 400 MG tablet Take 400 mg by mouth daily.     . metFORMIN (GLUCOPHAGE) 500 MG tablet Take 500 mg by mouth 2 (two) times daily.     . metoprolol succinate (TOPROL-XL) 50 MG 24 hr tablet Take 1 tablet (50 mg total) by mouth daily. Take with or immediately following a meal. 30 tablet 11  . Rivaroxaban (XARELTO) 15 MG TABS tablet Take 1 tablet (15 mg total) by mouth daily. 60 tablet 1  . rosuvastatin (CRESTOR) 20 MG tablet Take 1 tablet (20 mg total) by  mouth daily at 6 PM. 60 tablet 1   No current facility-administered medications for this visit.    Allergies:   Aspirin and Codeine sulfate   Social History:  The patient  reports that he has quit smoking. He uses smokeless tobacco. He reports that he does not drink alcohol or use illicit drugs.   Family History:  The patient's family history includes CAD in  his mother; Diabetes in his other; Stroke in his mother.  ROS:   Review of Systems  Constitutional: Positive for malaise/fatigue. Negative for fever, chills, weight loss and diaphoresis.  HENT: Negative for congestion.   Eyes: Negative for discharge and redness.  Respiratory: Positive for shortness of breath. Negative for cough, hemoptysis, sputum production and wheezing.   Cardiovascular: Negative for chest pain, palpitations, orthopnea, claudication, leg swelling and PND.  Gastrointestinal: Negative for heartburn, nausea, vomiting, abdominal pain, blood in stool and melena.  Musculoskeletal: Negative for myalgias and falls.  Skin: Negative for rash.  Neurological: Negative for dizziness, tingling, tremors, sensory change, speech change, focal weakness, seizures, loss of consciousness and weakness.  Endo/Heme/Allergies: Does not bruise/bleed easily.  Psychiatric/Behavioral: Negative for substance abuse. The patient is not nervous/anxious.   All other systems reviewed and are negative.    PHYSICAL EXAM:  VS:  BP 98/74 mmHg  Pulse 85  Ht 5\' 8"  (1.727 m)  Wt 154 lb 12.8 oz (70.217 kg)  BMI 23.54 kg/m2 BMI: Body mass index is 23.54 kg/(m^2). Well nourished, well developed, in no acute distress HEENT: normocephalic, atraumatic Neck: no JVD, carotid bruits or masses Cardiac: irregularly-irregular, S1, S2; RRR; no murmurs, rubs, or gallops Lungs:  clear to auscultation bilaterally, no wheezing, rhonchi or rales Abd: soft, nontender, no hepatomegaly, + BS MS: no deformity or atrophy Ext: no edema Skin: warm and dry, no  rash Neuro:  moves all extremities spontaneously, no focal abnormalities noted, follows commands Psych: euthymic mood, full affect   EKG:  Was ordered today. Shows Afib, 85 bpm, peaked T waves, lateral TWI  Recent Labs: 05/11/2015: ALT 48 05/12/2015: TSH 1.925 06/10/2015: B Natriuretic Peptide 231.0* 06/14/2015: BUN 38*; Creatinine, Ser 1.83*; Hemoglobin 13.8; Platelets 225; Potassium 5.2*; Sodium 134*  No results found for requested labs within last 365 days.   Estimated Creatinine Clearance: 33.7 mL/min (by C-G formula based on Cr of 1.83).   Wt Readings from Last 3 Encounters:  06/14/15 154 lb 12.8 oz (70.217 kg)  06/10/15 145 lb (65.772 kg)  06/09/15 153 lb (69.4 kg)     Other studies reviewed: Additional studies/records reviewed today include: summarized above  ASSESSMENT AND PLAN:  1. Chronic Afib: He is currently rate controlled. It is unclear why the patient was told to hold Coumadin in the ED on 2/17 leading to him missing one dose just prior to his DCCV when he has not been on Coumadin. INR levels are not accurate or diagnostic in a patient on Xarelto. Because of this missed dose he will now have to undergo a TEE/DCCV on 06/17/15 to ensure there is not a clot in the left atrial appendage. He has resumed his Xarelto 15 mg q dinner based on his CrCl of 34.46 mL/min after missing that one dose and is tolerating this without any issues. His CBC is stable and he does not have any bruising. CHADS2VASc at least 4 (CHF, HTN, age x 2).   2. Chronic combined CHF: He does not appear to be volume overloaded at this time.   3. CKD stage III: Has had issues with hyperkalemia given dehydration and acute on chronic kidney injury. He has been working on hydration. BMET is pending. If he is found to be severely hyperkalemic today he states he will not go to the ED. If he needs inpatient treatment he would then be directly admitted. Metformin has been held. He needs a nephrologist.   4. Elevated  INR: His elevated INR's are non-diagnostic given  that he is on Xarelto 15 mg q dinner. He is not on Coumadin.   5. CAD: No symptoms concerning for angina at this time. Continue current medications. On Xarelto in place of aspirin. No plans for ischemic work up at this time.   6. Emphysema/SOB: He may benefit from a pulmonary evaluation. I suspect his SOB is multifactorial including his chronic Afib, HF, and emphysema. He does have poor insight into all of these issues. Continue inhalers per PCP.   7. HTN: Well controlled. Decrease Toprol-XL to 25 mg daily given his slightly soft BP today. Continue Cardizem as above.   8. Malnutrition: He is working to increase his po food consumption and supplementing with Boost or Ensure.   Disposition: F/u with Dr. Fletcher Anon, MD status post cardioversion on 06/17/15  Current medicines are reviewed at length with the patient today.  The patient did not have any concerns regarding medicines.  Melvern Banker PA-C 06/14/2015 3:42 PM     [address] [place], [place] [address] [phone]

## 2015-06-17 NOTE — Transfer of Care (Signed)
Immediate Anesthesia Transfer of Care Note  Patient: Gregory Tyler  Procedure(s) Performed: Procedure(s): CARDIOVERSION (N/A) TRANSESOPHAGEAL ECHOCARDIOGRAM (TEE) (N/A)  Patient Location: Short Stay  Anesthesia Type:General  Level of Consciousness: awake, alert , oriented and patient cooperative  Airway & Oxygen Therapy: Patient Spontanous Breathing  Post-op Assessment: Report given to RN, Post -op Vital signs reviewed and stable and Patient moving all extremities X 4  Post vital signs: Reviewed and stable  Last Vitals:  Filed Vitals:   06/17/15 0806 06/17/15 0909  BP: 118/85 122/72  Pulse: 74 65  Temp:  36 C  Resp: 13 17    Complications: No apparent anesthesia complications

## 2015-06-17 NOTE — Progress Notes (Signed)
*  PRELIMINARY RESULTS* Echocardiogram 2D Echocardiogram has been performed.  Laqueta Jean Hege 06/17/2015, 9:11 AM

## 2015-06-22 ENCOUNTER — Telehealth: Payer: Self-pay | Admitting: Cardiovascular Disease

## 2015-06-22 NOTE — Telephone Encounter (Signed)
Pt calling stating last night he had another bad episode of nose bleeding.  It got so bad he called 911 and they got it to stop But now its starting back again Thinks it may be his BP medication  Would like some advise.  Please call.

## 2015-06-22 NOTE — Telephone Encounter (Signed)
Spoke w/ pt.  He has a h/o nosebleeds, was advised at last ov w/ Ryan to use saline spray & afrin prn. He has been using both of these w/ little relief, reports a nosebleed last night that was so bad that called 911. EMTs came out and applied ice packs, recommended pt go to ED, but he refused. He was advised at that time that his "blood thinner might be too strong." Pt currently takes Xarelto 15 mg daily, has not taken this am. He states that Thurmond Butts told him that he "was not going to send me back to the emergency room for anything else". Advised pt that if bleeding returns and persists, to call 911 and allow them to take him to ED for tx. Advised him that I will make Dr. Rockey Situ aware and call him back w/ his recommendation; pt prefers not to resume xarelto until he hears back from our office.

## 2015-06-23 NOTE — Telephone Encounter (Signed)
Spoke w/ pt.  Advised him of Dr. Donivan Scull recommendation. He states that he resumed his Xarelto this am.  He would like to hold off on setting up appt, as he has several doctor appts in the next 2 weeks and "they are wearing me out". He has an appt w/ Dr. Sandra Cockayne tomorrow @ [facility] and will discuss w/ him, as well. Asked him to call back if we can be of further assistance.

## 2015-06-23 NOTE — Telephone Encounter (Signed)
Can we help make an appointment with ear nose throat? May need cauterization of a blood vessel Stopping the blood thinner not a good option long-term as this may lead to a stroke which is permanent Best option is to fix the bleed

## 2015-07-01 ENCOUNTER — Ambulatory Visit (INDEPENDENT_AMBULATORY_CARE_PROVIDER_SITE_OTHER): Payer: Medicare Other | Admitting: Cardiovascular Disease

## 2015-07-01 ENCOUNTER — Encounter: Payer: Self-pay | Admitting: Cardiovascular Disease

## 2015-07-01 VITALS — BP 112/70 | HR 57 | Ht 68.0 in | Wt 157.5 lb

## 2015-07-01 DIAGNOSIS — I10 Essential (primary) hypertension: Secondary | ICD-10-CM

## 2015-07-01 DIAGNOSIS — I482 Chronic atrial fibrillation, unspecified: Secondary | ICD-10-CM

## 2015-07-01 DIAGNOSIS — I2510 Atherosclerotic heart disease of native coronary artery without angina pectoris: Secondary | ICD-10-CM

## 2015-07-01 MED ORDER — APIXABAN 5 MG PO TABS: 5.0000 mg | ORAL_TABLET | Freq: Two times a day (BID) | ORAL | Status: DC

## 2015-07-01 NOTE — Patient Instructions (Signed)
Medication Instructions:  Your physician has recommended you make the following change in your medication:  STOP taking xarelto START taking Eliquis 5mg  twice daily   Labwork: BMET, CBC in one month  Testing/Procedures: none  Follow-Up: Your physician recommends that you schedule a follow-up appointment in: one month with Dr. Rockey Situ.    Any Other Special Instructions Will Be Listed Below (If Applicable).     If you need a refill on your cardiac medications before your next appointment, please call your pharmacy.

## 2015-07-01 NOTE — Progress Notes (Signed)
Cardiology Office Note   Date:  07/01/2015   ID:  Gregory Tyler, DOB 04-13-1941, MRN [id number]  PCP:  [name], MD  Cardiologist:  Dr. Rockey Situ  Chief Complaint  Patient presents with  . other    F/U from Cardioversion. Meds reviewed by the pt.'s med list. "doing well." Pt. c/o frequent nosebleeds; thinks it could be the xarelto.       History of Present Illness: Gregory Tyler is a 75 y.o. male who presents for A follow-up visit regarding persistent atrial fibrillation status post recent cardioversion. He has known history of coronary artery disease, persistent atrial fibrillation, combined systolic and diastolic heart failure, COPD, hypertension, prostate cancer and type 2 diabetes. Recent echocardiogram showed an ejection fraction of 35-40%. The patient was started on anticoagulation with Xarelto in January. He underwent recent TEE guided cardioversion successfully and he has been maintaining in sinus rhythm since then. His biggest complaint is recurrent nosebleed that required him to call EMS one time because he could not stop the bleeding. Of note, his INR is significantly elevated on Xarelto. He reports improvement in dyspnea. He denies any chest pain.    Past Medical History  Diagnosis Date  . Prostate cancer ([place])   . Arthritis   . Diabetes mellitus without complication ([place])   . Hypertension   . CHF (congestive heart failure) (HCC)     Combined Systolic and Diastolic; EF 53-66% in 44/0347  . Emphysema lung ([place])   . Hyperlipidemia   . Gout   . Gastric ulcer   . Anemia   . Nephrolithiasis   . Coronary artery disease   . Anxiety   . Depression   . Chronic atrial fibrillation (HCC)     a. on Xarelto  . Shortness of breath dyspnea   . Dysrhythmia   . Weight loss     50 lb since Apr 24, 2015    Past Surgical History  Procedure Laterality Date  . Cholecystectomy    . Appendectomy    . Gastric bypass    . Hemorrhoid surgery    . Back surgery      . Pilonidal cyst    . Electrophysiologic study N/A 06/17/2015    Procedure: CARDIOVERSION;  Surgeon: Wellington Hampshire, MD;  Location: [facility] ORS;  Service: Cardiovascular;  Laterality: N/A;  . Tee without cardioversion N/A 06/17/2015    Procedure: TRANSESOPHAGEAL ECHOCARDIOGRAM (TEE);  Surgeon: Wellington Hampshire, MD;  Location: [facility] ORS;  Service: Cardiovascular;  Laterality: N/A;     Current Outpatient Prescriptions  Medication Sig Dispense Refill  . allopurinol (ZYLOPRIM) 300 MG tablet Take 300 mg by mouth daily.     Marland Kitchen BAYER CONTOUR TEST test strip as directed.  12  . BAYER MICROLET LANCETS lancets 2 (two) times daily. for testing  0  . Blood Glucose Monitoring Suppl (BAYER CONTOUR MONITOR) w/Device KIT 2 (two) times daily. for testing  0  . budesonide-formoterol (SYMBICORT) 160-4.5 MCG/ACT inhaler Inhale 2 puffs into the lungs 2 (two) times daily.    Marland Kitchen diltiazem (CARDIZEM CD) 120 MG 24 hr capsule Take 1 capsule (120 mg total) by mouth daily. 60 capsule 1  . HYDROcodone-acetaminophen (NORCO/VICODIN) 5-325 MG per tablet Take 1 tablet by mouth every 4 (four) hours as needed for moderate pain (pt reports only takes half pill).     . magnesium oxide (MAG-OX) 400 MG tablet Take 400 mg by mouth daily.     . metoprolol succinate (TOPROL-XL) 50 MG 24  hr tablet Take 1 tablet (50 mg total) by mouth daily. Take with or immediately following a meal. 30 tablet 11  . Rivaroxaban (XARELTO) 15 MG TABS tablet Take 1 tablet (15 mg total) by mouth daily. 60 tablet 1  . rosuvastatin (CRESTOR) 20 MG tablet Take 1 tablet (20 mg total) by mouth daily at 6 PM. 60 tablet 1   No current facility-administered medications for this visit.    Allergies:   Aspirin and Codeine sulfate    Social History:  The patient  reports that he has quit smoking. He uses smokeless tobacco. He reports that he does not drink alcohol or use illicit drugs.   Family History:  The patient's family history includes CAD in his mother;  Diabetes in his other; Stroke in his mother.    ROS:  Please see the history of present illness.   Otherwise, review of systems are positive for none.   All other systems are reviewed and negative.    PHYSICAL EXAM: VS:  BP 112/70 mmHg  Pulse 57  Ht _0  (1.727 m)  Wt 157 lb 8 oz (71.442 kg)  BMI 23.95 kg/m2 , BMI Body mass index is 23.95 kg/(m^2). GEN: Well nourished, well developed, in no acute distress HEENT: normal Neck: no JVD, carotid bruits, or masses Cardiac: RRR; no  rubs, or gallops,no edema . There is 2/6 holosystolic murmur at the left sternal border Respiratory:  clear to auscultation bilaterally, normal work of breathing GI: soft, nontender, nondistended, + BS MS: no deformity or atrophy Skin: warm and dry, no rash Neuro:  Strength and sensation are intact Psych: euthymic mood, full affect   EKG:  EKG is ordered today. The ekg ordered today demonstrates sinus bradycardia with nonspecific IVCD and T wave changes.   Recent Labs: 05/11/2015: ALT 48 05/12/2015: TSH 1.925 06/10/2015: B Natriuretic Peptide 231.0* 06/14/2015: BUN 38*; Creatinine, Ser 1.83*; Hemoglobin 13.8; Platelets 225; Potassium 5.2*; Sodium 134*    Lipid Panel No results found for: CHOL, TRIG, HDL, CHOLHDL, VLDL, LDLCALC, LDLDIRECT    Wt Readings from Last 3 Encounters:  07/01/15 157 lb 8 oz (71.442 kg)  06/15/15 154 lb (69.854 kg)  06/14/15 154 lb 12.8 oz (70.217 kg)      Other studies Reviewed: Additional studies/ records that were reviewed today include: Nephrology follow-up for acute on chronic renal failure.. Review of the above records demonstrates: Most recent creatinine was 1.8.   ASSESSMENT AND PLAN:  1.  Persistent atrial fibrillation: Status post recent successful cardioversion to sinus rhythm. He continues to have recurrent nosebleed since then. He has declined ENT referral for cauterization. It is highly possible that he might have an exaggerated response to Xarelto especially  with his significantly abnormal coagulation studies. Due to that, I elected to switch him to Eliquis as a trial especially that he has underlying chronic kidney disease. If he continues to have recurrent nosebleed, I advised him to follow-up with ENT. I will schedule him for routine labs in one month.  2. Chronic systolic heart failure: He appears to be euvolemic at the present time. Blood pressure is low and does not allow adding an ACE inhibitor. It's also not a good time given his recent acute on chronic renal failure.  3. Coronary artery disease: He appears to be stable with no anginal symptoms.       Disposition:   FU with Dr. Rockey Situ in 1 month  Signed,  Kathlyn Sacramento, MD  07/01/2015 11:32 AM    [facility]

## 2015-08-08 ENCOUNTER — Ambulatory Visit: Payer: Medicare Other | Admitting: Cardiovascular Disease

## 2015-08-08 ENCOUNTER — Other Ambulatory Visit (INDEPENDENT_AMBULATORY_CARE_PROVIDER_SITE_OTHER): Payer: Medicare Other

## 2015-08-08 ENCOUNTER — Ambulatory Visit (INDEPENDENT_AMBULATORY_CARE_PROVIDER_SITE_OTHER): Payer: Medicare Other | Admitting: Cardiovascular Disease

## 2015-08-08 ENCOUNTER — Encounter: Payer: Self-pay | Admitting: Cardiovascular Disease

## 2015-08-08 ENCOUNTER — Other Ambulatory Visit: Payer: Medicare Other

## 2015-08-08 VITALS — BP 116/60 | HR 57 | Ht 68.0 in | Wt 161.4 lb

## 2015-08-08 DIAGNOSIS — I482 Chronic atrial fibrillation, unspecified: Secondary | ICD-10-CM

## 2015-08-08 DIAGNOSIS — I2510 Atherosclerotic heart disease of native coronary artery without angina pectoris: Secondary | ICD-10-CM

## 2015-08-08 DIAGNOSIS — I481 Persistent atrial fibrillation: Secondary | ICD-10-CM | POA: Diagnosis not present

## 2015-08-08 DIAGNOSIS — I4819 Other persistent atrial fibrillation: Secondary | ICD-10-CM

## 2015-08-08 DIAGNOSIS — E785 Hyperlipidemia, unspecified: Secondary | ICD-10-CM

## 2015-08-08 DIAGNOSIS — I5022 Chronic systolic (congestive) heart failure: Secondary | ICD-10-CM

## 2015-08-08 MED ORDER — APIXABAN 5 MG PO TABS: 5.0000 mg | ORAL_TABLET | Freq: Two times a day (BID) | ORAL | Status: DC

## 2015-08-08 MED ORDER — ALBUTEROL SULFATE HFA 108 (90 BASE) MCG/ACT IN AERS: 2.0000 | INHALATION_SPRAY | Freq: Four times a day (QID) | RESPIRATORY_TRACT | Status: DC | PRN

## 2015-08-08 MED ORDER — ATORVASTATIN CALCIUM 20 MG PO TABS: 20.0000 mg | ORAL_TABLET | Freq: Every day | ORAL | Status: DC

## 2015-08-08 NOTE — Patient Instructions (Addendum)
You are doing well.  Stop the crestor when you run out (expensive) Change to atorvastatin one a day  Please call us if you have new issues that need to be addressed before your next appt.  Your physician wants you to follow-up in: 6 months.  You will receive a reminder letter in the mail two months in advance. If you don't receive a letter, please call our office to schedule the follow-up appointment.

## 2015-08-08 NOTE — Assessment & Plan Note (Signed)
Recent cardioversion, maintaining normal sinus rhythm We'll continue him on his current medication regimen Prescription sent for eliquis, samples provided. He will check the price

## 2015-08-08 NOTE — Assessment & Plan Note (Signed)
Unable to afford Crestor, recommended he try Lipitor 20 mg daily

## 2015-08-08 NOTE — Assessment & Plan Note (Signed)
He appears euvolemic on today's visit Lasix previously held for hypotension He does have shortness of breath symptoms, possibly from prior smoking history and symptoms worse since he stopped his Symbicort. Recommended he closely watch his weight, restart Lasix for leg swelling

## 2015-08-08 NOTE — Assessment & Plan Note (Signed)
Currently with no symptoms of angina. No further workup at this time. Continue current medication regimen. 

## 2015-08-08 NOTE — Progress Notes (Signed)
Patient ID: Gregory Tyler, male    DOB: [birth year], 75 y.o.   MRN: [id number]  HPI Comments:  Gregory Tyler is a pleasant 75 year old gentleman  With history of paroxysmal atrial fibrillation , diabetes, previously followed at [facility] ( Patient wanted to change  Cardiology group),  Who presented to the hospital 04/25/2015 with shortness of breath,  Found to have atrial fibrillation with RVR, severe hypertension,  Placed on BiPAP for respiratory distress.  Recent cardioversion 06/17/2015 which was successful He presents for routine follow-up of his paroxysmal atrial fibrillation following cardioversion  On his last clinic visit in January 2017, blood pressure was 80 systolic Lasix was held He reports on previous office visit February 2017, metoprolol tartrate was changed to metoprolol succinate. This was more expensive for him. He has difficulty affording his inhalers, has not had to fill the eliquis is worried this is expensive. Also reports the Crestor is expensive for him Wife has very high medical bills they are having difficulty paying for everything He reports having some mild shortness of breath on exertion, did not notice much difference following the cardioversion. Symptoms of shortness of breath perhaps worse after he stopped his Symbicort 2 weeks ago. Does not have the money to refill at this time.  Previous echocardiogram June or 2017 with ejection fraction 35-40% while in atrial fibrillation  EKG on today's visit showing normal sinus rhythm with rate 57 bpm, nonspecific ST and T wave abnormality anterolateral leads  Unchanged from EKG 06/14/2015     Allergies  Allergen Reactions  . Aspirin     GI Bleeding  . Codeine Sulfate     Other reaction(s): Other (See Comments) BLISTERS    Current Outpatient Prescriptions on File Prior to Visit  Medication Sig Dispense Refill  . allopurinol (ZYLOPRIM) 300 MG tablet Take 300 mg by mouth daily.     Marland Kitchen BAYER CONTOUR TEST test  strip as directed.  12  . BAYER MICROLET LANCETS lancets 2 (two) times daily. for testing  0  . Blood Glucose Monitoring Suppl (BAYER CONTOUR MONITOR) w/Device KIT 2 (two) times daily. for testing  0  . budesonide-formoterol (SYMBICORT) 160-4.5 MCG/ACT inhaler Inhale 2 puffs into the lungs 2 (two) times daily.    Marland Kitchen diltiazem (CARDIZEM CD) 120 MG 24 hr capsule Take 1 capsule (120 mg total) by mouth daily. 60 capsule 1  . HYDROcodone-acetaminophen (NORCO/VICODIN) 5-325 MG per tablet Take 1 tablet by mouth every 4 (four) hours as needed for moderate pain (pt reports only takes half pill).     . magnesium oxide (MAG-OX) 400 MG tablet Take 400 mg by mouth daily.     . metoprolol succinate (TOPROL-XL) 50 MG 24 hr tablet Take 1 tablet (50 mg total) by mouth daily. Take with or immediately following a meal. 30 tablet 11   No current facility-administered medications on file prior to visit.    Past Medical History  Diagnosis Date  . Prostate cancer ([place])   . Arthritis   . Diabetes mellitus without complication ([place])   . Hypertension   . CHF (congestive heart failure) (HCC)     Combined Systolic and Diastolic; EF 06-23% in 76/2831  . Emphysema lung ([place])   . Hyperlipidemia   . Gout   . Gastric ulcer   . Anemia   . Nephrolithiasis   . Coronary artery disease   . Anxiety   . Depression   . Chronic atrial fibrillation (HCC)     a. on Xarelto  .  Shortness of breath dyspnea   . Dysrhythmia   . Weight loss     50 lb since Apr 24, 2015    Past Surgical History  Procedure Laterality Date  . Cholecystectomy    . Appendectomy    . Gastric bypass    . Hemorrhoid surgery    . Back surgery    . Pilonidal cyst    . Electrophysiologic study N/A 06/17/2015    Procedure: CARDIOVERSION;  Surgeon: Wellington Hampshire, MD;  Location: [facility] ORS;  Service: Cardiovascular;  Laterality: N/A;  . Tee without cardioversion N/A 06/17/2015    Procedure: TRANSESOPHAGEAL ECHOCARDIOGRAM (TEE);  Surgeon: Wellington Hampshire, MD;  Location: [facility] ORS;  Service: Cardiovascular;  Laterality: N/A;    Social History  reports that he has quit smoking. He uses smokeless tobacco. He reports that he does not drink alcohol or use illicit drugs.  Family History family history includes CAD in his mother; Diabetes in his other; Stroke in his mother.   Review of Systems  Constitutional: Negative.   Respiratory: Negative.   Cardiovascular: Negative.   Gastrointestinal: Negative.   Musculoskeletal: Negative.   Neurological: Negative.   Hematological: Negative.   Psychiatric/Behavioral: Negative.   All other systems reviewed and are negative.   BP 116/60 mmHg  Pulse 57  Ht 5' 8" (1.727 m)  Wt 161 lb 6.4 oz (73.211 kg)  BMI 24.55 kg/m2  SpO2 96%  Physical Exam  Constitutional: He is oriented to person, place, and time. He appears well-developed and well-nourished.  HENT:  Head: Normocephalic.  Nose: Nose normal.  Mouth/Throat: Oropharynx is clear and moist.  Eyes: Conjunctivae are normal. Pupils are equal, round, and reactive to light.  Neck: Normal range of motion. Neck supple. No JVD present.  Cardiovascular: Normal rate, normal heart sounds and intact distal pulses.  An irregularly irregular rhythm present. Exam reveals no gallop and no friction rub.   No murmur heard. Pulmonary/Chest: Effort normal and breath sounds normal. No respiratory distress. He has no wheezes. He has no rales. He exhibits no tenderness.  Abdominal: Soft. Bowel sounds are normal. He exhibits no distension. There is no tenderness.  Musculoskeletal: Normal range of motion. He exhibits no edema or tenderness.  Lymphadenopathy:    He has no cervical adenopathy.  Neurological: He is alert and oriented to person, place, and time. Coordination normal.  Skin: Skin is warm and dry. No rash noted. No erythema.  Psychiatric: He has a normal mood and affect. His behavior is normal. Judgment and thought content normal.

## 2015-08-09 LAB — BASIC METABOLIC PANEL
BUN / CREAT RATIO: 15 (ref 10–24)
BUN: 18 mg/dL (ref 8–27)
CO2: 20 mmol/L (ref 18–29)
Calcium: 9.6 mg/dL (ref 8.6–10.2)
Chloride: 102 mmol/L (ref 96–106)
Creatinine, Ser: 1.23 mg/dL (ref 0.76–1.27)
GFR calc Af Amer: 66 mL/min/{1.73_m2} (ref >59)
GFR calc non Af Amer: 57 mL/min/{1.73_m2} — ABNORMAL LOW (ref >59)
GLUCOSE: 154 mg/dL — AB (ref 65–99)
POTASSIUM: 5.5 mmol/L — AB (ref 3.5–5.2)
SODIUM: 140 mmol/L (ref 134–144)

## 2015-08-09 LAB — CBC
Hematocrit: 39.2 % (ref 37.5–51.0)
Hemoglobin: 12.9 g/dL (ref 12.6–17.7)
MCH: 31.3 pg (ref 26.6–33.0)
MCHC: 32.9 g/dL (ref 31.5–35.7)
MCV: 95 fL (ref 79–97)
Platelets: 230 10*3/uL (ref 150–379)
RBC: 4.12 x10E6/uL — ABNORMAL LOW (ref 4.14–5.80)
RDW: 16 % — AB (ref 12.3–15.4)
WBC: 6.3 10*3/uL (ref 3.4–10.8)

## 2015-08-10 ENCOUNTER — Telehealth: Payer: Self-pay | Admitting: Cardiovascular Disease

## 2015-08-10 ENCOUNTER — Other Ambulatory Visit: Payer: Self-pay

## 2015-08-10 DIAGNOSIS — E875 Hyperkalemia: Secondary | ICD-10-CM

## 2015-08-10 NOTE — Telephone Encounter (Signed)
Pt requiring PA for Eliquis.  PA has been forwarded thru cover my meds. Awaiting approval.

## 2015-08-10 NOTE — Telephone Encounter (Signed)
Pt is calling stating he called his insurance  And they told him that we needed to do a prior authorization on Eliquis

## 2015-08-11 NOTE — Telephone Encounter (Signed)
Spoke with pharmacist and PA was processed. She mentioned pt would have to pay 198.07 when picking up medication due to co-pay. She also mentioned that pt insurance co-pay may increase through time putting pt in Donut hole. Pt is aware and said that he can't pay that much and is on a limited income. He is wanting to know what are his alternatives. He mentioned that if he does not have any he will stop taking Eliquis himself. Please advise.

## 2015-08-11 NOTE — Telephone Encounter (Signed)
Pt has been approved for Eliquis 05/12/15-08/09/16.

## 2015-08-12 ENCOUNTER — Other Ambulatory Visit
Admission: RE | Admit: 2015-08-12 | Discharge: 2015-08-12 | Disposition: A | Discharge status: Home / Self Care | Payer: Medicare Other | Source: Ambulatory Visit | Attending: Cardiovascular Disease | Admitting: Cardiovascular Disease

## 2015-08-12 DIAGNOSIS — E875 Hyperkalemia: Secondary | ICD-10-CM

## 2015-08-12 LAB — BASIC METABOLIC PANEL
ANION GAP: 5 (ref 5–15)
BUN: 29 mg/dL — AB (ref 6–20)
CHLORIDE: 108 mmol/L (ref 101–111)
CO2: 26 mmol/L (ref 22–32)
Calcium: 9.2 mg/dL (ref 8.9–10.3)
Creatinine, Ser: 1.22 mg/dL (ref 0.61–1.24)
GFR calc Af Amer: >60 mL/min (ref >60)
GFR calc non Af Amer: 56 mL/min — ABNORMAL LOW (ref >60)
GLUCOSE: 175 mg/dL — AB (ref 65–99)
POTASSIUM: 5.2 mmol/L — AB (ref 3.5–5.1)
Sodium: 139 mmol/L (ref 135–145)

## 2015-08-17 ENCOUNTER — Telehealth: Payer: Self-pay | Admitting: *Deleted

## 2015-08-17 MED ORDER — APIXABAN 5 MG PO TABS: 5.0000 mg | ORAL_TABLET | Freq: Two times a day (BID) | ORAL | Status: DC

## 2015-08-17 NOTE — Telephone Encounter (Signed)
Talked with patient today regarding some results and he reported that he can no longer afford the eliquis and will run out next week. He was previously on xarelto and had some bleeding issues with this and that is why they switched him to eliquis. He had mentioned that someone called regarding prior authorization but did not see where any other options were discussed. Patient wants to know if there is something else that he can take that is more affordable. Let him know that I would forward this for review.

## 2015-08-17 NOTE — Telephone Encounter (Signed)
See results note, this was a duplicate entry

## 2015-08-17 NOTE — Telephone Encounter (Signed)
Patient unable to afford his eliquis prescription. Printed out application for assistance and placed that with samples up front for patient to pick up.

## 2015-08-17 NOTE — Telephone Encounter (Signed)
-----   Message from Rise Mu, PA-C sent at 08/17/2015 10:22 AM EDT ----- Yes, there is a patient assistance form he can fill out. Please have him fill this out. He can have samples until he hears back from the drug company.

## 2015-08-22 ENCOUNTER — Other Ambulatory Visit: Payer: Self-pay | Admitting: *Deleted

## 2015-08-22 ENCOUNTER — Telehealth: Payer: Self-pay | Admitting: Cardiovascular Disease

## 2015-08-22 MED ORDER — DILTIAZEM HCL ER COATED BEADS 120 MG PO CP24: 120.0000 mg | ORAL_CAPSULE | Freq: Every day | ORAL | Status: DC

## 2015-08-22 NOTE — Telephone Encounter (Signed)
Requested Prescriptions   Signed Prescriptions Disp Refills  . diltiazem (CARDIZEM CD) 120 MG 24 hr capsule 60 capsule 3    Sig: Take 1 capsule (120 mg total) by mouth daily.    Authorizing Provider: Minna Merritts    Ordering User: Britt Bottom

## 2015-08-22 NOTE — Telephone Encounter (Signed)
Cardizem 120 mg #60 R#3 sent to [facility] for Refill.

## 2015-08-22 NOTE — Telephone Encounter (Signed)
°*  STAT* If patient is at the pharmacy, call can be transferred to refill team.   1. Which medications need to be refilled? (please list name of each medication and dose if known)  Cardizem 120 mg po  Once daily   2. Which pharmacy/location (including street and city if local pharmacy) is medication to be sent to?  [address]   3. Do they need a 30 day or 90 day supply? 60 if possible worried about cost

## 2015-08-29 ENCOUNTER — Telehealth: Payer: Self-pay

## 2015-08-29 NOTE — Telephone Encounter (Signed)
See message below °

## 2015-08-29 NOTE — Telephone Encounter (Signed)
Patient is denied for patient assistance on Eliquis 5 mg.  Please advise what other medication he can take that is cheaper. He can't afford the Eliquis.

## 2015-08-31 NOTE — Telephone Encounter (Signed)
Spoke w/ pt.  Advised him of Dr. Donivan Scull recommendation. He is agreeable to starting coumadin, as "it looks like it's my only option". He has enough Eliquis to last him through the end of the month.  Advised him that I will call him back w/ dosage and that we will need to set him up in the coumadin clinic.  He is appreciative and will await a call back.

## 2015-08-31 NOTE — Telephone Encounter (Signed)
Cheapest option will be warfarin He could either have the level checked through primary care or our office, whichever is cheapest  We could investigate patient assistance with xarelto, though suspect it might be the same

## 2015-09-01 NOTE — Telephone Encounter (Signed)
Would set up appt with anticoag clinic to start

## 2015-09-02 NOTE — Telephone Encounter (Signed)
Can we set Gregory Tyler up to see [name]? He has enough Eliquis to last Gregory Tyler til the end of the month, so he will need his appt before he runs out.

## 2015-09-12 ENCOUNTER — Ambulatory Visit: Payer: Medicare Other | Admitting: Family

## 2015-09-12 NOTE — Telephone Encounter (Signed)
Pt is coming 09/21/15 at 130 pm for coumadin

## 2015-09-15 ENCOUNTER — Encounter: Payer: Self-pay | Admitting: Family

## 2015-09-15 ENCOUNTER — Ambulatory Visit: Payer: Medicare Other | Attending: Family | Admitting: Family

## 2015-09-15 VITALS — BP 123/78 | HR 68 | Resp 18 | Ht 68.0 in | Wt 163.0 lb

## 2015-09-15 DIAGNOSIS — M109 Gout, unspecified: Secondary | ICD-10-CM | POA: Diagnosis not present

## 2015-09-15 DIAGNOSIS — R42 Dizziness and giddiness: Secondary | ICD-10-CM | POA: Insufficient documentation

## 2015-09-15 DIAGNOSIS — E785 Hyperlipidemia, unspecified: Secondary | ICD-10-CM | POA: Diagnosis not present

## 2015-09-15 DIAGNOSIS — F329 Major depressive disorder, single episode, unspecified: Secondary | ICD-10-CM | POA: Insufficient documentation

## 2015-09-15 DIAGNOSIS — Z7901 Long term (current) use of anticoagulants: Secondary | ICD-10-CM | POA: Insufficient documentation

## 2015-09-15 DIAGNOSIS — E119 Type 2 diabetes mellitus without complications: Secondary | ICD-10-CM

## 2015-09-15 DIAGNOSIS — R634 Abnormal weight loss: Secondary | ICD-10-CM | POA: Insufficient documentation

## 2015-09-15 DIAGNOSIS — I482 Chronic atrial fibrillation, unspecified: Secondary | ICD-10-CM

## 2015-09-15 DIAGNOSIS — M1990 Unspecified osteoarthritis, unspecified site: Secondary | ICD-10-CM | POA: Insufficient documentation

## 2015-09-15 DIAGNOSIS — Z8546 Personal history of malignant neoplasm of prostate: Secondary | ICD-10-CM | POA: Diagnosis not present

## 2015-09-15 DIAGNOSIS — Z9884 Bariatric surgery status: Secondary | ICD-10-CM | POA: Insufficient documentation

## 2015-09-15 DIAGNOSIS — I5022 Chronic systolic (congestive) heart failure: Secondary | ICD-10-CM | POA: Diagnosis present

## 2015-09-15 DIAGNOSIS — R5383 Other fatigue: Secondary | ICD-10-CM | POA: Diagnosis not present

## 2015-09-15 DIAGNOSIS — Z885 Allergy status to narcotic agent status: Secondary | ICD-10-CM | POA: Insufficient documentation

## 2015-09-15 DIAGNOSIS — F419 Anxiety disorder, unspecified: Secondary | ICD-10-CM | POA: Insufficient documentation

## 2015-09-15 DIAGNOSIS — N289 Disorder of kidney and ureter, unspecified: Secondary | ICD-10-CM | POA: Insufficient documentation

## 2015-09-15 DIAGNOSIS — Z8249 Family history of ischemic heart disease and other diseases of the circulatory system: Secondary | ICD-10-CM | POA: Diagnosis not present

## 2015-09-15 DIAGNOSIS — Z87891 Personal history of nicotine dependence: Secondary | ICD-10-CM | POA: Insufficient documentation

## 2015-09-15 DIAGNOSIS — Z886 Allergy status to analgesic agent status: Secondary | ICD-10-CM | POA: Diagnosis not present

## 2015-09-15 DIAGNOSIS — I10 Essential (primary) hypertension: Secondary | ICD-10-CM | POA: Diagnosis not present

## 2015-09-15 DIAGNOSIS — I2510 Atherosclerotic heart disease of native coronary artery without angina pectoris: Secondary | ICD-10-CM | POA: Insufficient documentation

## 2015-09-15 DIAGNOSIS — J439 Emphysema, unspecified: Secondary | ICD-10-CM | POA: Diagnosis not present

## 2015-09-15 DIAGNOSIS — J45909 Unspecified asthma, uncomplicated: Secondary | ICD-10-CM | POA: Diagnosis not present

## 2015-09-15 DIAGNOSIS — Z87442 Personal history of urinary calculi: Secondary | ICD-10-CM | POA: Insufficient documentation

## 2015-09-15 DIAGNOSIS — Z8719 Personal history of other diseases of the digestive system: Secondary | ICD-10-CM | POA: Diagnosis not present

## 2015-09-15 DIAGNOSIS — Z9049 Acquired absence of other specified parts of digestive tract: Secondary | ICD-10-CM | POA: Insufficient documentation

## 2015-09-15 DIAGNOSIS — I951 Orthostatic hypotension: Secondary | ICD-10-CM

## 2015-09-15 NOTE — Patient Instructions (Signed)
Continue weighing daily and call for an overnight weight gain of > 2 pounds or a weekly weight gain of >5 pounds. 

## 2015-09-15 NOTE — Progress Notes (Signed)
Subjective:    Patient ID: Gregory Tyler, male    DOB: 08/23/1940, 75 y.o.   MRN: [id number]  Congestive Heart Failure Presents for follow-up visit. The disease course has been stable. Associated symptoms include fatigue and shortness of breath. Pertinent negatives include no abdominal pain, chest pain, edema, orthopnea or palpitations. The symptoms have been stable. Past treatments include salt and fluid restriction and beta blockers. The treatment provided mild relief. Compliance with prior treatments has been good. His past medical history is significant for arrhythmia, CAD, chronic lung disease, DM and HTN. There is no history of CVA. He has one 1st degree relative with heart disease.  Atrial Fibrillation Presents for follow-up visit. Symptoms include dizziness (off-balance), hypotension and shortness of breath. Symptoms are negative for bradycardia, chest pain and palpitations. The symptoms have been stable. Past treatments include Ca channel blockers, beta blockers and anticoagulant. Compliance with prior treatments has been good. Past medical history includes atrial fibrillation, CAD, CHF and HTN. Medication compliance problems include medication cost.   Past Medical History  Diagnosis Date  . Prostate cancer ([place])   . Arthritis   . Diabetes mellitus without complication ([place])   . Hypertension   . CHF (congestive heart failure) (HCC)     Combined Systolic and Diastolic; EF 60-10% in 93/2355  . Emphysema lung ([place])   . Hyperlipidemia   . Gout   . Gastric ulcer   . Anemia   . Nephrolithiasis   . Coronary artery disease   . Anxiety   . Depression   . Chronic atrial fibrillation (HCC)     a. on Xarelto  . Shortness of breath dyspnea   . Dysrhythmia   . Weight loss     50 lb since Apr 24, 2015    Past Surgical History  Procedure Laterality Date  . Cholecystectomy    . Appendectomy    . Gastric bypass    . Hemorrhoid surgery    . Back surgery    . Pilonidal cyst    .  Electrophysiologic study N/A 06/17/2015    Procedure: CARDIOVERSION;  Surgeon: Wellington Hampshire, MD;  Location: [facility] ORS;  Service: Cardiovascular;  Laterality: N/A;  . Tee without cardioversion N/A 06/17/2015    Procedure: TRANSESOPHAGEAL ECHOCARDIOGRAM (TEE);  Surgeon: Wellington Hampshire, MD;  Location: [facility] ORS;  Service: Cardiovascular;  Laterality: N/A;    Family History  Problem Relation Age of Onset  . CAD Mother   . Stroke Mother   . Diabetes Other     Social History  Substance Use Topics  . Smoking status: Former Research scientist (life sciences)  . Smokeless tobacco: Current User  . Alcohol Use: No    Allergies  Allergen Reactions  . Aspirin     GI Bleeding  . Codeine Sulfate     Other reaction(s): Other (See Comments) BLISTERS    Prior to Admission medications   Medication Sig Start Date End Date Taking? Authorizing Provider  albuterol (PROVENTIL HFA;VENTOLIN HFA) 108 (90 Base) MCG/ACT inhaler Inhale 2 puffs into the lungs every 6 (six) hours as needed for wheezing or shortness of breath. 08/08/15  Yes Minna Merritts, MD  apixaban (ELIQUIS) 5 MG TABS tablet Take 1 tablet (5 mg total) by mouth 2 (two) times daily. 08/17/15  Yes Minna Merritts, MD  atorvastatin (LIPITOR) 20 MG tablet Take 1 tablet (20 mg total) by mouth daily. 08/08/15  Yes Minna Merritts, MD  BAYER CONTOUR TEST test strip as directed. 05/17/15  Yes  Historical Provider, MD  BAYER MICROLET LANCETS lancets 2 (two) times daily. for testing 05/13/15  Yes Historical Provider, MD  Blood Glucose Monitoring Suppl (BAYER CONTOUR MONITOR) w/Device KIT 2 (two) times daily. for testing 05/13/15  Yes Historical Provider, MD  diltiazem (CARDIZEM CD) 120 MG 24 hr capsule Take 1 capsule (120 mg total) by mouth daily. 08/22/15  Yes Minna Merritts, MD  HYDROcodone-acetaminophen (NORCO/VICODIN) 5-325 MG per tablet Take 1 tablet by mouth every 4 (four) hours as needed for moderate pain (pt reports only takes half pill).    Yes Historical Provider, MD   magnesium oxide (MAG-OX) 400 MG tablet Take 400 mg by mouth daily.    Yes Historical Provider, MD  metoprolol tartrate (LOPRESSOR) 25 MG tablet Take 25 mg by mouth 2 (two) times daily.   Yes Historical Provider, MD      Review of Systems  Constitutional: Positive for fatigue. Negative for appetite change.  HENT: Negative for congestion, rhinorrhea and sore throat.   Eyes: Negative.   Respiratory: Positive for shortness of breath. Negative for cough, chest tightness and wheezing.   Cardiovascular: Negative for chest pain, palpitations and leg swelling.  Gastrointestinal: Negative for abdominal pain and abdominal distention.  Endocrine: Negative.   Genitourinary: Negative.   Musculoskeletal: Positive for back pain. Negative for neck pain.  Skin: Negative.   Allergic/Immunologic: Negative.   Neurological: Positive for dizziness (off-balance). Negative for light-headedness and headaches.  Hematological: Negative for adenopathy. Bruises/bleeds easily.  Psychiatric/Behavioral: Negative for sleep disturbance (sleeping on 2 pillows) and dysphoric mood. The patient is not nervous/anxious.        Objective:   Physical Exam  Constitutional: He is oriented to person, place, and time. He appears well-developed and well-nourished.  HENT:  Head: Normocephalic and atraumatic.  Eyes: Conjunctivae are normal. Pupils are equal, round, and reactive to light.  Neck: Normal range of motion. Neck supple.  Cardiovascular: Normal rate.  An irregular rhythm present.  Pulmonary/Chest: Effort normal. He has no wheezes. He has no rales.  Abdominal: Soft. He exhibits no distension. There is no tenderness.  Musculoskeletal: He exhibits no edema or tenderness.  Neurological: He is alert and oriented to person, place, and time.  Skin: Skin is warm and dry.  Psychiatric: He has a normal mood and affect. His behavior is normal. Thought content normal.  Nursing note and vitals reviewed.  BP 123/78 mmHg  Pulse  68  Resp 18  Ht 5' 8"  (1.727 m)  Wt 163 lb (73.936 kg)  BMI 24.79 kg/m2  SpO2 100%        Assessment & Plan:  1: Chronic heart failure with reduced ejection fraction- Patient presents with fatigue and shortness of breath upon exertion. He was a little short of breath upon walking into the office but once he sat down for a few minutes, his breathing improved although he says that he gets a little short of breath even at rest at times. He continues to weigh himself and says that his weight has been stable. By our scale, he has gained 9 pounds since he was last here on 06/15/15. Reminded to call for an overnight weight gain of >2 pounds or a weekly weight gain of >5 pounds. He is not adding any salt to his food and does try to eat low sodium foods. [facility] PharmD went in and reviewed medications with the patient. 2: Atrial fibrillation- Patient is currently rate controlled with diltiazem and metoprolol (twice daily dosing). Is currently on eliquis because  he had nose bleeding with xarelto but says that he can't afford the eliquis either. He says that the plan is for him to start going to the coumadin clinic and be placed on warfarin.  3: Orthostatic hypotension- Patient does have some dizziness especially with position changes. Encouraged him to change positions slowly. Unable to begin ACE-i/ARB due to underlying renal insufficiency. 4: Diabetes- He says that his most recent glucose level was 254. He says that his metformin was stopped due to his renal disease. Encouraged him to follow-up closely with his PCP regarding his diabetes.   Medication bottles were reviewed. He says that he was taking symbicort but had albuterol started and he was under the impression that he was to stop the symbicort because "you don't use 2 inhalers". Discussed that you can use those 2 inhalers together but that he needed to call his cardiologist who placed him on the albuterol to confirm whether he is to stop the symbicort or  not.   Return here in 3 months or sooner for any questions/problems before then.

## 2015-09-21 ENCOUNTER — Ambulatory Visit (INDEPENDENT_AMBULATORY_CARE_PROVIDER_SITE_OTHER): Payer: Medicare Other

## 2015-09-21 ENCOUNTER — Encounter (INDEPENDENT_AMBULATORY_CARE_PROVIDER_SITE_OTHER): Payer: Self-pay

## 2015-09-21 DIAGNOSIS — I4819 Other persistent atrial fibrillation: Secondary | ICD-10-CM

## 2015-09-21 DIAGNOSIS — Z7189 Other specified counseling: Secondary | ICD-10-CM | POA: Insufficient documentation

## 2015-09-21 DIAGNOSIS — I481 Persistent atrial fibrillation: Secondary | ICD-10-CM

## 2015-09-21 DIAGNOSIS — Z7901 Long term (current) use of anticoagulants: Secondary | ICD-10-CM | POA: Diagnosis not present

## 2015-09-21 MED ORDER — WARFARIN SODIUM 5 MG PO TABS: ORAL_TABLET | ORAL | Status: DC

## 2015-09-21 NOTE — Patient Instructions (Signed)

## 2015-10-12 ENCOUNTER — Ambulatory Visit (INDEPENDENT_AMBULATORY_CARE_PROVIDER_SITE_OTHER): Payer: Medicare Other

## 2015-10-12 DIAGNOSIS — Z7901 Long term (current) use of anticoagulants: Secondary | ICD-10-CM

## 2015-10-12 DIAGNOSIS — I481 Persistent atrial fibrillation: Secondary | ICD-10-CM

## 2015-10-12 DIAGNOSIS — I4819 Other persistent atrial fibrillation: Secondary | ICD-10-CM

## 2015-10-12 LAB — POCT INR: INR: 2.1

## 2015-10-19 ENCOUNTER — Ambulatory Visit (INDEPENDENT_AMBULATORY_CARE_PROVIDER_SITE_OTHER): Payer: Medicare Other | Admitting: *Deleted

## 2015-10-19 DIAGNOSIS — I481 Persistent atrial fibrillation: Secondary | ICD-10-CM

## 2015-10-19 DIAGNOSIS — I4819 Other persistent atrial fibrillation: Secondary | ICD-10-CM

## 2015-10-19 DIAGNOSIS — Z7901 Long term (current) use of anticoagulants: Secondary | ICD-10-CM

## 2015-10-19 LAB — POCT INR: INR: 1.5

## 2015-10-26 ENCOUNTER — Ambulatory Visit (INDEPENDENT_AMBULATORY_CARE_PROVIDER_SITE_OTHER): Payer: Medicare Other | Admitting: Pharmacist

## 2015-10-26 DIAGNOSIS — Z7901 Long term (current) use of anticoagulants: Secondary | ICD-10-CM | POA: Diagnosis not present

## 2015-10-26 DIAGNOSIS — I481 Persistent atrial fibrillation: Secondary | ICD-10-CM

## 2015-10-26 DIAGNOSIS — I4819 Other persistent atrial fibrillation: Secondary | ICD-10-CM

## 2015-10-26 LAB — POCT INR: INR: 1.6

## 2015-11-02 ENCOUNTER — Ambulatory Visit (INDEPENDENT_AMBULATORY_CARE_PROVIDER_SITE_OTHER): Payer: Medicare Other | Admitting: *Deleted

## 2015-11-02 DIAGNOSIS — I481 Persistent atrial fibrillation: Secondary | ICD-10-CM

## 2015-11-02 DIAGNOSIS — Z7901 Long term (current) use of anticoagulants: Secondary | ICD-10-CM

## 2015-11-02 DIAGNOSIS — I4819 Other persistent atrial fibrillation: Secondary | ICD-10-CM

## 2015-11-02 LAB — POCT INR: INR: 2.3

## 2015-11-09 ENCOUNTER — Ambulatory Visit (INDEPENDENT_AMBULATORY_CARE_PROVIDER_SITE_OTHER): Payer: Medicare Other

## 2015-11-09 DIAGNOSIS — Z7901 Long term (current) use of anticoagulants: Secondary | ICD-10-CM

## 2015-11-09 DIAGNOSIS — I481 Persistent atrial fibrillation: Secondary | ICD-10-CM | POA: Diagnosis not present

## 2015-11-09 DIAGNOSIS — I4819 Other persistent atrial fibrillation: Secondary | ICD-10-CM

## 2015-11-09 LAB — POCT INR: INR: 2.7

## 2015-11-09 MED ORDER — WARFARIN SODIUM 5 MG PO TABS: ORAL_TABLET | ORAL | Status: DC

## 2015-11-23 ENCOUNTER — Ambulatory Visit (INDEPENDENT_AMBULATORY_CARE_PROVIDER_SITE_OTHER): Payer: Medicare Other

## 2015-11-23 DIAGNOSIS — Z7901 Long term (current) use of anticoagulants: Secondary | ICD-10-CM

## 2015-11-23 DIAGNOSIS — I481 Persistent atrial fibrillation: Secondary | ICD-10-CM

## 2015-11-23 DIAGNOSIS — I4819 Other persistent atrial fibrillation: Secondary | ICD-10-CM

## 2015-11-23 LAB — POCT INR: INR: 3.4

## 2015-12-07 ENCOUNTER — Ambulatory Visit (INDEPENDENT_AMBULATORY_CARE_PROVIDER_SITE_OTHER): Payer: Medicare Other

## 2015-12-07 ENCOUNTER — Telehealth: Payer: Self-pay | Admitting: Cardiovascular Disease

## 2015-12-07 DIAGNOSIS — I481 Persistent atrial fibrillation: Secondary | ICD-10-CM | POA: Diagnosis not present

## 2015-12-07 DIAGNOSIS — Z7901 Long term (current) use of anticoagulants: Secondary | ICD-10-CM

## 2015-12-07 DIAGNOSIS — I4819 Other persistent atrial fibrillation: Secondary | ICD-10-CM

## 2015-12-07 LAB — POCT INR: INR: 3.1

## 2015-12-07 NOTE — Telephone Encounter (Signed)
Patient was just in the office this morning for coumadin check and now c/o bleeding with BM.  Wants to talk to [place].    Attempted to contact erika no response.  Will tx call to triage nurse pam.

## 2015-12-07 NOTE — Telephone Encounter (Signed)
Patient states that he has not had any problems with bleeding and then once he got home he had a large amount of blood in his stool. He states that he was dizzy earlier when he was here. Instructed him to go to emergency room to be evaluated for the bleeding and he refused at this time stating that he did not want to sit there all day. Told him the symptoms he should monitor for and he again refused to go to ED at this time. He states that he will just continue to monitor himself and that if it gets worse then he will not have a choice but to go. Let him know that I would let the coumadin nurse and MD aware of the bleeding. He verbalized understanding of our conversation and had no further questions at this time.

## 2015-12-07 NOTE — Telephone Encounter (Signed)
Called spoke with pt, pt states he had 2 episodes of dark red blood from rectum today after returning home from INR check.  Pt refuses to go to ED for assessment secondary to having to wait for hours to be seen.  Advised pt INR was 3.1, his blood was a little thin, but not thin enough to be the cause of his bleeding.  Advised pt is bleeding reoccurs or persists he should go to the ED to be assessed and find out why he is bleeding and where it is coming from.  Pt verbalized understanding and stated he would go somewhere to be assessed if it happens again.  Pt states his lower stomach was hurting earlier today, but feel better now.  Pt states he does have a hx of hemorrhoids and diverticulitis. Pt states his BM was not black in color or tarry.  Pt will continue to monitor understands risks of bleeding internally.

## 2015-12-07 NOTE — Telephone Encounter (Signed)
Would send a message to Dr. Merry Proud sparks Patient may need outpt GI workup

## 2015-12-08 NOTE — Telephone Encounter (Signed)
Spoke with patient to check on current bleeding. Patient reports that his bleeding has gotten lighter but he does have lower abdominal pain. Let him know that I am going to send a note to Dr. Doy Hutching to make him aware and Dr. Rockey Situ recommends possibly seeing GI for outpatient workup. Instructed patient again that going to ED for any active bleeding but he refuses at this time. Instructed him to call back if we can be of any further help. He verbalized understanding of our conversation and had no further questions.

## 2015-12-16 ENCOUNTER — Encounter: Payer: Self-pay | Admitting: Family

## 2015-12-16 ENCOUNTER — Ambulatory Visit: Payer: Medicare Other | Attending: Family | Admitting: Family

## 2015-12-16 VITALS — BP 122/85 | HR 108 | Resp 18 | Ht 68.0 in | Wt 170.0 lb

## 2015-12-16 DIAGNOSIS — Z833 Family history of diabetes mellitus: Secondary | ICD-10-CM | POA: Diagnosis not present

## 2015-12-16 DIAGNOSIS — Z87891 Personal history of nicotine dependence: Secondary | ICD-10-CM | POA: Insufficient documentation

## 2015-12-16 DIAGNOSIS — I482 Chronic atrial fibrillation, unspecified: Secondary | ICD-10-CM

## 2015-12-16 DIAGNOSIS — I5022 Chronic systolic (congestive) heart failure: Secondary | ICD-10-CM | POA: Insufficient documentation

## 2015-12-16 DIAGNOSIS — I2510 Atherosclerotic heart disease of native coronary artery without angina pectoris: Secondary | ICD-10-CM | POA: Insufficient documentation

## 2015-12-16 DIAGNOSIS — Z8711 Personal history of peptic ulcer disease: Secondary | ICD-10-CM | POA: Diagnosis not present

## 2015-12-16 DIAGNOSIS — Z823 Family history of stroke: Secondary | ICD-10-CM | POA: Diagnosis not present

## 2015-12-16 DIAGNOSIS — Z886 Allergy status to analgesic agent status: Secondary | ICD-10-CM | POA: Diagnosis not present

## 2015-12-16 DIAGNOSIS — Z9049 Acquired absence of other specified parts of digestive tract: Secondary | ICD-10-CM | POA: Diagnosis not present

## 2015-12-16 DIAGNOSIS — Z9884 Bariatric surgery status: Secondary | ICD-10-CM | POA: Insufficient documentation

## 2015-12-16 DIAGNOSIS — I110 Hypertensive heart disease with heart failure: Secondary | ICD-10-CM | POA: Insufficient documentation

## 2015-12-16 DIAGNOSIS — J439 Emphysema, unspecified: Secondary | ICD-10-CM | POA: Insufficient documentation

## 2015-12-16 DIAGNOSIS — E1142 Type 2 diabetes mellitus with diabetic polyneuropathy: Secondary | ICD-10-CM | POA: Insufficient documentation

## 2015-12-16 DIAGNOSIS — M109 Gout, unspecified: Secondary | ICD-10-CM | POA: Insufficient documentation

## 2015-12-16 DIAGNOSIS — M1990 Unspecified osteoarthritis, unspecified site: Secondary | ICD-10-CM | POA: Insufficient documentation

## 2015-12-16 DIAGNOSIS — Z8546 Personal history of malignant neoplasm of prostate: Secondary | ICD-10-CM | POA: Insufficient documentation

## 2015-12-16 DIAGNOSIS — Z885 Allergy status to narcotic agent status: Secondary | ICD-10-CM | POA: Insufficient documentation

## 2015-12-16 DIAGNOSIS — D649 Anemia, unspecified: Secondary | ICD-10-CM | POA: Insufficient documentation

## 2015-12-16 DIAGNOSIS — Z8249 Family history of ischemic heart disease and other diseases of the circulatory system: Secondary | ICD-10-CM | POA: Diagnosis not present

## 2015-12-16 DIAGNOSIS — I10 Essential (primary) hypertension: Secondary | ICD-10-CM

## 2015-12-16 NOTE — Patient Instructions (Signed)
Continue weighing daily and call for an overnight weight gain of > 2 pounds or a weekly weight gain of >5 pounds. 

## 2015-12-16 NOTE — Progress Notes (Signed)
Subjective:    Patient ID: Gregory Tyler, male    DOB: 26-May-1940, 75 y.o.   MRN: [id number]  Congestive Heart Failure  Presents for follow-up visit. The disease course has been stable. Associated symptoms include fatigue and shortness of breath. Pertinent negatives include no abdominal pain, chest pain, edema, orthopnea or palpitations. The symptoms have been stable. Past treatments include beta blockers and salt and fluid restriction. The treatment provided moderate relief. Compliance with prior treatments has been good. His past medical history is significant for anemia, arrhythmia, CAD, chronic lung disease, DM and HTN. He has multiple 1st degree relatives with heart disease.  Hypertension  This is a chronic problem. The current episode started more than 1 year ago. The problem is unchanged. The problem is controlled. Associated symptoms include shortness of breath. Pertinent negatives include no chest pain, neck pain, palpitations or peripheral edema. There are no associated agents to hypertension. Risk factors for coronary artery disease include diabetes mellitus, dyslipidemia, family history and male gender. Past treatments include beta blockers, calcium channel blockers and lifestyle changes. The current treatment provides moderate improvement. There are no compliance problems.  Hypertensive end-organ damage includes kidney disease, CAD/MI and heart failure.   Past Medical History:  Diagnosis Date  . Anemia   . Anxiety   . Arthritis   . CHF (congestive heart failure) (HCC)    Combined Systolic and Diastolic; EF 15-37% in 94/3276  . Chronic atrial fibrillation (HCC)    a. on Xarelto  . Coronary artery disease   . Depression   . Diabetes mellitus without complication ([place])   . Dysrhythmia   . Emphysema lung ([place])   . Gastric ulcer   . Gout   . Hyperlipidemia   . Hypertension   . Nephrolithiasis   . Prostate cancer ([place])   . Shortness of breath dyspnea   . Weight loss    50  lb since Apr 24, 2015    Past Surgical History:  Procedure Laterality Date  . APPENDECTOMY    . BACK SURGERY    . CHOLECYSTECTOMY    . ELECTROPHYSIOLOGIC STUDY N/A 06/17/2015   Procedure: CARDIOVERSION;  Surgeon: Wellington Hampshire, MD;  Location: [facility] ORS;  Service: Cardiovascular;  Laterality: N/A;  . GASTRIC BYPASS    . HEMORRHOID SURGERY    . pilonidal cyst    . TEE WITHOUT CARDIOVERSION N/A 06/17/2015   Procedure: TRANSESOPHAGEAL ECHOCARDIOGRAM (TEE);  Surgeon: Wellington Hampshire, MD;  Location: [facility] ORS;  Service: Cardiovascular;  Laterality: N/A;    Family History  Problem Relation Age of Onset  . CAD Mother   . Stroke Mother   . Diabetes Other     Social History  Substance Use Topics  . Smoking status: Former Research scientist (life sciences)  . Smokeless tobacco: Current User  . Alcohol use No    Allergies  Allergen Reactions  . Aspirin     GI Bleeding  . Codeine Sulfate     Other reaction(s): Other (See Comments) BLISTERS    Prior to Admission medications   Medication Sig Start Date End Date Taking? Authorizing Provider  albuterol (PROVENTIL HFA;VENTOLIN HFA) 108 (90 Base) MCG/ACT inhaler Inhale 2 puffs into the lungs every 6 (six) hours as needed for wheezing or shortness of breath. 08/08/15  Yes Minna Merritts, MD  allopurinol (ZYLOPRIM) 300 MG tablet Take 100 mg by mouth every other day.   Yes Historical Provider, MD  atorvastatin (LIPITOR) 20 MG tablet Take 20 mg by mouth daily.  Yes Historical Provider, MD  BAYER CONTOUR TEST test strip as directed. 05/17/15  Yes Historical Provider, MD  BAYER MICROLET LANCETS lancets 2 (two) times daily. for testing 05/13/15  Yes Historical Provider, MD  Blood Glucose Monitoring Suppl (BAYER CONTOUR MONITOR) w/Device KIT 2 (two) times daily. for testing 05/13/15  Yes Historical Provider, MD  diltiazem (CARDIZEM CD) 120 MG 24 hr capsule Take 1 capsule (120 mg total) by mouth daily. 08/22/15  Yes Minna Merritts, MD  HYDROcodone-acetaminophen  (NORCO/VICODIN) 5-325 MG tablet Take 1 tablet by mouth every 4 (four) hours as needed for moderate pain.   Yes Historical Provider, MD  magnesium oxide (MAG-OX) 400 MG tablet Take 400 mg by mouth daily.    Yes Historical Provider, MD  metoprolol tartrate (LOPRESSOR) 25 MG tablet Take 25 mg by mouth 2 (two) times daily.   Yes Historical Provider, MD  warfarin (COUMADIN) 5 MG tablet Take as directed by [facility] 11/09/15  Yes Minna Merritts, MD      Review of Systems  Constitutional: Positive for fatigue. Negative for appetite change.  HENT: Negative for congestion, postnasal drip and sore throat.   Eyes: Positive for visual disturbance ("blurry vision"). Negative for pain.  Respiratory: Positive for cough and shortness of breath. Negative for chest tightness.   Cardiovascular: Negative for chest pain, palpitations and leg swelling.  Gastrointestinal: Negative for abdominal distention and abdominal pain.  Endocrine: Negative.   Genitourinary: Negative.   Musculoskeletal: Positive for back pain ("at times"). Negative for neck pain.  Skin: Negative.   Allergic/Immunologic: Negative.   Neurological: Positive for light-headedness and numbness (tingling in feet). Negative for dizziness.  Hematological: Negative for adenopathy. Bruises/bleeds easily.  Psychiatric/Behavioral: Negative for dysphoric mood and sleep disturbance. The patient is not nervous/anxious.        Objective:   Physical Exam  Constitutional: He is oriented to person, place, and time. He appears well-developed and well-nourished.  HENT:  Head: Normocephalic and atraumatic.  Eyes: Conjunctivae are normal. Pupils are equal, round, and reactive to light.  Neck: Normal range of motion. Neck supple.  Cardiovascular: An irregular rhythm present. Tachycardia present.   Pulmonary/Chest: Effort normal. He has no wheezes. He has no rales.  Abdominal: Soft. He exhibits no distension. There is no tenderness.  Musculoskeletal:  He exhibits no edema or tenderness.  Neurological: He is alert and oriented to person, place, and time.  Skin: Skin is warm and dry.  Psychiatric: He has a normal mood and affect. His behavior is normal. Thought content normal.  Nursing note and vitals reviewed.   BP 122/85   Pulse (!) 108   Resp 18   Ht 5' 8"  (1.727 m)   Wt 170 lb (77.1 kg)   SpO2 99%   BMI 25.85 kg/m        Assessment & Plan:  1: Chronic heart failure with reduced ejection fraction- Patient presents with chronic fatigue and shortness of breath with minimal exertion (Class III). Symptoms do improve quickly upon rest. He denies any swelling in his legs or abdomen. He continues to weigh himself and says that he's gradually gained weight because he's been eating more "junk" food trying to put some weight on. By our scale, he's gained 7.8 pounds since he was last here on 09/15/15. Reminded to call for an overnight weight gain of >2 pounds or a weekly weight gain of >5 pounds. He is not adding any salt to his food and even with eating "junk" food, he's trying  to still watch his sodium intake. Could consider adding entresto but would need to watch renal function closely. Patient is not interested at this time.  2: HTN- Blood pressure looks good today. Continue medications at this time. 3: Atrial fibrillation- Heart rate in the 100's today and he continues to take diltiazem and lopressor BID. Is now on warfarin and had a lot of concerns about warfarin so the [facility] went in and reviewed medications with him. Could consider increasing his lopressor in the future.  4: Diabetes- He remains off all diabetic medications due to renal insufficiency. Says that yesterday morning his glucose was 99 and yesterday evening it was 251. Sees his PCP on 01/05/16.  Medication bottles were reviewed.  Return in 3 months or sooner for any questions/problems before then.

## 2015-12-28 ENCOUNTER — Ambulatory Visit (INDEPENDENT_AMBULATORY_CARE_PROVIDER_SITE_OTHER): Payer: Medicare Other

## 2015-12-28 DIAGNOSIS — Z7901 Long term (current) use of anticoagulants: Secondary | ICD-10-CM | POA: Diagnosis not present

## 2015-12-28 DIAGNOSIS — I481 Persistent atrial fibrillation: Secondary | ICD-10-CM

## 2015-12-28 DIAGNOSIS — I4819 Other persistent atrial fibrillation: Secondary | ICD-10-CM

## 2015-12-28 LAB — POCT INR: INR: 2.6

## 2016-01-11 ENCOUNTER — Other Ambulatory Visit: Payer: Self-pay | Admitting: *Deleted

## 2016-01-12 ENCOUNTER — Inpatient Hospital Stay: Payer: Medicare Other | Attending: Radiation Oncology

## 2016-01-12 ENCOUNTER — Encounter: Payer: Self-pay | Admitting: Radiation Oncology

## 2016-01-12 ENCOUNTER — Ambulatory Visit
Admission: RE | Admit: 2016-01-12 | Discharge: 2016-01-12 | Disposition: A | Discharge status: Home / Self Care | Payer: Medicare Other | Source: Ambulatory Visit | Attending: Radiation Oncology | Admitting: Radiation Oncology

## 2016-01-12 VITALS — BP 130/78 | HR 72 | Temp 98.0°F | Ht 68.0 in | Wt 171.3 lb

## 2016-01-12 DIAGNOSIS — C61 Malignant neoplasm of prostate: Secondary | ICD-10-CM

## 2016-01-12 DIAGNOSIS — Z923 Personal history of irradiation: Secondary | ICD-10-CM | POA: Diagnosis not present

## 2016-01-12 DIAGNOSIS — Z9079 Acquired absence of other genital organ(s): Secondary | ICD-10-CM | POA: Diagnosis not present

## 2016-01-12 LAB — PSA: PSA: 0.01 ng/mL (ref 0.00–4.00)

## 2016-01-12 NOTE — Progress Notes (Signed)
Radiation Oncology Follow up Note  Name: Gregory Tyler   Date:   01/12/2016 MRN:  [id number] DOB: 20-Aug-1940    This 75 y.o. male presents to the clinic today for 7 year follow-up for prostate cancer.  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 He is seen today in routine follow-up continues to do well. His most recent PSA was undetectable a 0.01 performed in January 2017. He specifically denies diarrhea dysuria or any other GI/GU complaints..  COMPLICATIONS OF TREATMENT: none  FOLLOW UP COMPLIANCE: keeps appointments   PHYSICAL EXAM:  BP 130/78   Pulse 72   Temp 98 F (36.7 C)   Ht 5\' 8"  (1.727 m)   Wt 171 lb 4.8 oz (77.7 kg)   BMI 26.05 kg/m  On rectal exam rectal sphincter tone is good. Prostate is smooth contracted without evidence of nodularity or mass. Sulcus is preserved bilaterally. No discrete nodularity is identified. No other rectal abnormalities are noted. Well-developed well-nourished patient in NAD. HEENT reveals PERLA, EOMI, discs not visualized.  Oral cavity is clear. No oral mucosal lesions are identified. Neck is clear without evidence of cervical or supraclavicular adenopathy. Lungs are clear to A&P. Cardiac examination is essentially unremarkable with regular rate and rhythm without murmur rub or thrill. Abdomen is benign with no organomegaly or masses noted. Motor sensory and DTR levels are equal and symmetric in the upper and lower extremities. Cranial nerves II through XII are grossly intact. Proprioception is intact. No peripheral adenopathy or edema is identified. No motor or sensory levels are noted. Crude visual fields are within normal range.  RADIOLOGY RESULTS: No current films for review  PLAN: Present time patient is 7 years out biochemically free of disease. I'm going to discontinue  follow-up care on the patient. I am please was overall progress. Patient knows to call anytime with any concerns.  I would like to take this opportunity to thank you for allowing me to participate in the care of your patient.Armstead Peaks., MD

## 2016-01-25 ENCOUNTER — Ambulatory Visit (INDEPENDENT_AMBULATORY_CARE_PROVIDER_SITE_OTHER): Payer: Medicare Other

## 2016-01-25 DIAGNOSIS — I481 Persistent atrial fibrillation: Secondary | ICD-10-CM

## 2016-01-25 DIAGNOSIS — Z7901 Long term (current) use of anticoagulants: Secondary | ICD-10-CM | POA: Diagnosis not present

## 2016-01-25 DIAGNOSIS — I4819 Other persistent atrial fibrillation: Secondary | ICD-10-CM

## 2016-01-25 LAB — POCT INR: INR: 3.8

## 2016-02-06 ENCOUNTER — Encounter: Payer: Self-pay | Admitting: Cardiovascular Disease

## 2016-02-06 ENCOUNTER — Ambulatory Visit (INDEPENDENT_AMBULATORY_CARE_PROVIDER_SITE_OTHER): Payer: Medicare Other | Admitting: *Deleted

## 2016-02-06 ENCOUNTER — Ambulatory Visit (INDEPENDENT_AMBULATORY_CARE_PROVIDER_SITE_OTHER): Payer: Medicare Other | Admitting: Cardiovascular Disease

## 2016-02-06 VITALS — BP 158/82 | HR 75 | Ht 68.0 in | Wt 182.5 lb

## 2016-02-06 DIAGNOSIS — I4819 Other persistent atrial fibrillation: Secondary | ICD-10-CM

## 2016-02-06 DIAGNOSIS — I2510 Atherosclerotic heart disease of native coronary artery without angina pectoris: Secondary | ICD-10-CM | POA: Diagnosis not present

## 2016-02-06 DIAGNOSIS — Z7901 Long term (current) use of anticoagulants: Secondary | ICD-10-CM | POA: Diagnosis not present

## 2016-02-06 DIAGNOSIS — E1142 Type 2 diabetes mellitus with diabetic polyneuropathy: Secondary | ICD-10-CM

## 2016-02-06 DIAGNOSIS — I481 Persistent atrial fibrillation: Secondary | ICD-10-CM | POA: Diagnosis not present

## 2016-02-06 DIAGNOSIS — I10 Essential (primary) hypertension: Secondary | ICD-10-CM

## 2016-02-06 DIAGNOSIS — I5031 Acute diastolic (congestive) heart failure: Secondary | ICD-10-CM

## 2016-02-06 DIAGNOSIS — I480 Paroxysmal atrial fibrillation: Secondary | ICD-10-CM | POA: Diagnosis not present

## 2016-02-06 DIAGNOSIS — E7800 Pure hypercholesterolemia, unspecified: Secondary | ICD-10-CM

## 2016-02-06 DIAGNOSIS — R0609 Other forms of dyspnea: Secondary | ICD-10-CM

## 2016-02-06 DIAGNOSIS — Z7189 Other specified counseling: Secondary | ICD-10-CM

## 2016-02-06 LAB — POCT INR: INR: 2.2

## 2016-02-06 MED ORDER — FUROSEMIDE 40 MG PO TABS: 40.0000 mg | ORAL_TABLET | Freq: Every day | ORAL | 3 refills | Status: DC

## 2016-02-06 NOTE — Patient Instructions (Addendum)
Medication Instructions:   Please start furosemide/ lasix one a day for swelling in the morning  When the leg swelling has resolved, Then cut the pill in 1/2 daily  Labwork:  No new labs needed  Testing/Procedures:  No further testing at this time   Follow-Up: It was a pleasure seeing you in the office today. Please call us if you have new issues that need to be addressed before your next appt.  [phone]  Your physician wants you to follow-up in: 3 to 4 weeks  If you need a refill on your cardiac medications before your next appointment, please call your pharmacy.

## 2016-02-06 NOTE — Progress Notes (Signed)
Cardiology Office Note  Date:  02/06/2016   ID:  Gregory Tyler, DOB 12/26/1940, MRN [id number]  PCP:  Gregory Crouch, MD   Chief Complaint  Patient presents with  . other    6 mo f/u.  B/l LE edema since Sat.  Pt does not weight daily. EKG shows afib.     HPI:  Gregory Tyler is a pleasant 75 year old gentleman  With history of paroxysmal atrial fibrillation , diabetes,  Who presented to the hospital 04/25/2015 with shortness of breath,  Found to have atrial fibrillation with RVR, severe hypertension,  Placed on BiPAP for respiratory distress.  Recent cardioversion 06/17/2015 which was successful He presents for routine follow-up of his paroxysmal atrial fibrillation   In follow-up today, weight is up 20 pounds He feels short of breath, severe scrotal swelling, leg edema Has high fluid intake Almost went to the emergency room for swelling of his penis  EKG on today's visit showing atrial fibrillation with ventricular rate 75 bpm, nonspecific T wave abnormality  Atrial fib in 12/16/2015 in CHF clinic per the exam note , rate >100   back in April 2017 was sinus rhythm   Other past medical history reviewed  in January 2017, blood pressure was 80 systolic, Lasix was held Symptoms of shortness of breath perhaps worse after he stopped his Symbicort 2 weeks ago. Does not have the money to refill   Previous echocardiogram June or 2017 with ejection fraction 35-40% while in atrial fibrillation   PMH:   has a past medical history of Anemia; Anxiety; Arthritis; CHF (congestive heart failure) ([place]); Chronic atrial fibrillation ([place]); Coronary artery disease; Depression; Diabetes mellitus without complication ([place]); Dysrhythmia; Emphysema lung ([place]); Gastric ulcer; Gout; Hyperlipidemia; Hypertension; Nephrolithiasis; Prostate cancer ([place]); Shortness of breath dyspnea; and Weight loss.  PSH:    Past Surgical History:  Procedure Laterality Date  . APPENDECTOMY    . BACK SURGERY    .  CHOLECYSTECTOMY    . ELECTROPHYSIOLOGIC STUDY N/A 06/17/2015   Procedure: CARDIOVERSION;  Surgeon: Gregory Hampshire, MD;  Location: [facility] ORS;  Service: Cardiovascular;  Laterality: N/A;  . GASTRIC BYPASS    . HEMORRHOID SURGERY    . pilonidal cyst    . TEE WITHOUT CARDIOVERSION N/A 06/17/2015   Procedure: TRANSESOPHAGEAL ECHOCARDIOGRAM (TEE);  Surgeon: Gregory Hampshire, MD;  Location: [facility] ORS;  Service: Cardiovascular;  Laterality: N/A;    Current Outpatient Prescriptions  Medication Sig Dispense Refill  . albuterol (PROVENTIL HFA;VENTOLIN HFA) 108 (90 Base) MCG/ACT inhaler Inhale 2 puffs into the lungs every 6 (six) hours as needed for wheezing or shortness of breath. 1 Inhaler 6  . allopurinol (ZYLOPRIM) 300 MG tablet Take 100 mg by mouth every other day.    Marland Kitchen atorvastatin (LIPITOR) 20 MG tablet Take 20 mg by mouth daily.    Marland Kitchen BAYER CONTOUR TEST test strip as directed.  12  . BAYER MICROLET LANCETS lancets 2 (two) times daily. for testing  0  . Blood Glucose Monitoring Suppl (BAYER CONTOUR MONITOR) w/Device KIT 2 (two) times daily. for testing  0  . diltiazem (CARDIZEM CD) 120 MG 24 hr capsule Take 1 capsule (120 mg total) by mouth daily. 60 capsule 3  . FLUZONE HIGH-DOSE 0.5 ML SUSY TO BE ADMINISTERED BY PHARMACIST FOR IMMUNIZATION  0  . HYDROcodone-acetaminophen (NORCO/VICODIN) 5-325 MG tablet Take 1 tablet by mouth every 4 (four) hours as needed for moderate pain.    . magnesium oxide (MAG-OX) 400 MG tablet Take 400 mg  by mouth daily.     . metoprolol tartrate (LOPRESSOR) 25 MG tablet Take 25 mg by mouth 2 (two) times daily.    . SYMBICORT 160-4.5 MCG/ACT inhaler     . warfarin (COUMADIN) 5 MG tablet Take as directed by [facility] 30 tablet 3  . furosemide (LASIX) 40 MG tablet Take 1 tablet (40 mg total) by mouth daily. 90 tablet 3   No current facility-administered medications for this visit.      Allergies:   Aspirin and Codeine sulfate   Social History:  The patient   reports that he has quit smoking. He uses smokeless tobacco. He reports that he does not drink alcohol or use drugs.   Family History:   family history includes CAD in his mother; Diabetes in his other; Stroke in his mother.    Review of Systems: Review of Systems  Constitutional: Negative.   Respiratory: Negative.   Cardiovascular: Positive for leg swelling.  Gastrointestinal: Negative.   Genitourinary:       Scrotal swelling, penis swelling  Musculoskeletal: Negative.   Neurological: Negative.   Psychiatric/Behavioral: Negative.   All other systems reviewed and are negative.    PHYSICAL EXAM: VS:  BP (!) 158/82 (BP Location: Left Arm, Patient Position: Sitting, Cuff Size: Normal)   Pulse 75   Ht _0  (1.727 m)   Wt 182 lb 8 oz (82.8 kg)   BMI 27.75 kg/m  , BMI Body mass index is 27.75 kg/m. GEN: Well nourished, well developed, in no acute distress  HEENT: normal  Neck: no JVD, carotid bruits, or masses Cardiac: Irregularly irregular , no murmurs, rubs, or gallops, 1-2+ pitting edema to the thighs  Respiratory:  clear to auscultation bilaterally, normal work of breathing GI: soft, nontender, nondistended, + BS MS: no deformity or atrophy  Skin: warm and dry, no rash Neuro:  Strength and sensation are intact Psych: euthymic mood, full affect    Recent Labs: 05/11/2015: ALT 48 05/12/2015: TSH 1.925 06/10/2015: B Natriuretic Peptide 231.0 06/14/2015: Hemoglobin 13.8 08/08/2015: Platelets 230 08/12/2015: BUN 29; Creatinine, Ser 1.22; Potassium 5.2; Sodium 139    Lipid Panel No results found for: CHOL, HDL, LDLCALC, TRIG    Wt Readings from Last 3 Encounters:  02/06/16 182 lb 8 oz (82.8 kg)  01/12/16 171 lb 4.8 oz (77.7 kg)  12/16/15 170 lb (77.1 kg)       ASSESSMENT AND PLAN:  Paroxysmal atrial fibrillation ([place]) - Plan: EKG 12-Lead Previously in normal sinus rhythm April 2017, now in atrial fibrillation Likely contributing to his acute diastolic  CHF Recommended he start Lasix 40 mg daily Weight is up 20 pounds  Essential hypertension Blood pressure is well controlled on today's visit. No changes made to the medications. We'll start Lasix as above  Coronary artery disease involving native coronary artery of native heart without angina pectoris Currently with no symptoms of angina. No further workup at this time. Continue current medication regimen.  Pure hypercholesterolemia Currently on Lipitor daily  Dyspnea on exertion Lungs clear on exam, clear signs of fluid overload. We'll start Lasix daily  Type 2 diabetes mellitus with diabetic polyneuropathy, without long-term current use of insulin ([place]) We have encouraged continued exercise, careful diet management in an effort to lose weight.  Encounter for anticoagulation discussion and counseling Doing well on warfarin, INR 2.2 today  Acute diastolic CHF (congestive heart failure) (HCC) As above, dramatic weight gain, leg edema, scrotal edema Drinking large amounts of fluids, now in atrial fibrillation  We'll start Lasix daily, try to restore normal sinus rhythm at a later date   Total encounter time more than 25 minutes  Greater than 50% was spent in counseling and coordination of care with the patient   Disposition:   F/U  1 month    Orders Placed This Encounter  Procedures  . EKG 12-Lead     Signed, Esmond Plants, M.D., Ph.D. 02/06/2016  [place], [place]

## 2016-03-07 ENCOUNTER — Telehealth: Payer: Self-pay | Admitting: Cardiovascular Disease

## 2016-03-07 ENCOUNTER — Ambulatory Visit (INDEPENDENT_AMBULATORY_CARE_PROVIDER_SITE_OTHER): Payer: Medicare Other

## 2016-03-07 ENCOUNTER — Encounter: Payer: Self-pay | Admitting: Cardiovascular Disease

## 2016-03-07 ENCOUNTER — Ambulatory Visit (INDEPENDENT_AMBULATORY_CARE_PROVIDER_SITE_OTHER): Payer: Medicare Other | Admitting: Cardiovascular Disease

## 2016-03-07 VITALS — BP 110/68 | HR 58 | Ht 68.0 in | Wt 155.0 lb

## 2016-03-07 DIAGNOSIS — I2510 Atherosclerotic heart disease of native coronary artery without angina pectoris: Secondary | ICD-10-CM

## 2016-03-07 DIAGNOSIS — I481 Persistent atrial fibrillation: Secondary | ICD-10-CM

## 2016-03-07 DIAGNOSIS — Z7189 Other specified counseling: Secondary | ICD-10-CM

## 2016-03-07 DIAGNOSIS — I4819 Other persistent atrial fibrillation: Secondary | ICD-10-CM

## 2016-03-07 DIAGNOSIS — Z7901 Long term (current) use of anticoagulants: Secondary | ICD-10-CM | POA: Diagnosis not present

## 2016-03-07 LAB — POCT INR: INR: 2.1

## 2016-03-07 MED ORDER — AMIODARONE HCL 200 MG PO TABS: 200.0000 mg | ORAL_TABLET | Freq: Two times a day (BID) | ORAL | 2 refills | Status: DC

## 2016-03-07 MED ORDER — FUROSEMIDE 20 MG PO TABS: 20.0000 mg | ORAL_TABLET | Freq: Every day | ORAL | 3 refills | Status: DC | PRN

## 2016-03-07 NOTE — Telephone Encounter (Signed)
I need about 2 weeks on the Amio before I need to see him INR check and appt with me Dec 6th? thx TG

## 2016-03-07 NOTE — Telephone Encounter (Signed)
Patient states that he will not be able to start the amiodarone until next week because he doesn't have the money. He wanted me to make [facility] aware of this as well. He had no further questions at this time.

## 2016-03-07 NOTE — Progress Notes (Signed)
Cardiology Office Note  Date:  03/07/2016   ID:  Gregory Tyler, DOB 01-Jan-1941, MRN [id number]  PCP:  Gregory Crouch, MD   Chief Complaint  Patient presents with  . Follow-up    CHF, AFIB    HPI:  [name] is a pleasant 75 year old gentleman  With history of paroxysmal atrial fibrillation , diabetes,  Who presented to the hospital 04/25/2015 with shortness of breath,  Found to have atrial fibrillation with RVR, severe hypertension,  Placed on BiPAP for respiratory distress.  Recent cardioversion 06/17/2015 which was successful He presents for routine follow-up of his paroxysmal atrial fibrillation   On his last clinic visit he was back in atrial fibrillation, 20 pound weight gain from CHF He had scrotal edema, severe leg edema almost went to the emergency room for swelling of his penis Started on Lasix daily  In follow-up today his Weight is down 25 pounds Feels much better still very SOB, weak, legs don't work Still on lasix 40 daily He is interested in restoring normal sinus rhythm Denies any chest pain symptoms, no lightheadedness or dizziness  Reports having difficulty affording diltiazem  EKG on today's visit showing atrial fibrillation with ventricular rate 58 bpm, nonspecific T wave abnormality  Atrial fib in 12/16/2015 in CHF clinic per the exam note , rate >100   back in April 2017 was sinus rhythm   Other past medical history reviewed  in January 2017, blood pressure was 80 systolic, Lasix was held Symptoms of shortness of breath perhaps worse after he stopped his Symbicort 2 weeks ago. Does not have the money to refill   Previous echocardiogram June or 2017 with ejection fraction 35-40% while in atrial fibrillation   PMH:   has a past medical history of Anemia; Anxiety; Arthritis; CHF (congestive heart failure) ([place]); Chronic atrial fibrillation ([place]); Coronary artery disease; Depression; Diabetes mellitus without complication ([place]); Dysrhythmia;  Emphysema lung ([place]); Gastric ulcer; Gout; Hyperlipidemia; Hypertension; Nephrolithiasis; Prostate cancer ([place]); Shortness of breath dyspnea; and Weight loss.  PSH:    Past Surgical History:  Procedure Laterality Date  . APPENDECTOMY    . BACK SURGERY    . CHOLECYSTECTOMY    . ELECTROPHYSIOLOGIC STUDY N/A 06/17/2015   Procedure: CARDIOVERSION;  Surgeon: Gregory Hampshire, MD;  Location: [facility] ORS;  Service: Cardiovascular;  Laterality: N/A;  . GASTRIC BYPASS    . HEMORRHOID SURGERY    . pilonidal cyst    . TEE WITHOUT CARDIOVERSION N/A 06/17/2015   Procedure: TRANSESOPHAGEAL ECHOCARDIOGRAM (TEE);  Surgeon: Gregory Hampshire, MD;  Location: [facility] ORS;  Service: Cardiovascular;  Laterality: N/A;    Current Outpatient Prescriptions  Medication Sig Dispense Refill  . albuterol (PROVENTIL HFA;VENTOLIN HFA) 108 (90 Base) MCG/ACT inhaler Inhale 2 puffs into the lungs every 6 (six) hours as needed for wheezing or shortness of breath. 1 Inhaler 6  . allopurinol (ZYLOPRIM) 300 MG tablet Take 100 mg by mouth every other day.    Marland Kitchen atorvastatin (LIPITOR) 20 MG tablet Take 20 mg by mouth daily.    Marland Kitchen BAYER CONTOUR TEST test strip as directed.  12  . BAYER MICROLET LANCETS lancets 2 (two) times daily. for testing  0  . Blood Glucose Monitoring Suppl (BAYER CONTOUR MONITOR) w/Device KIT 2 (two) times daily. for testing  0  . diltiazem (CARDIZEM CD) 120 MG 24 hr capsule Take 1 capsule (120 mg total) by mouth daily. 60 capsule 3  . FLUZONE HIGH-DOSE 0.5 ML SUSY TO BE ADMINISTERED BY PHARMACIST FOR  IMMUNIZATION  0  . furosemide (LASIX) 40 MG tablet Take 1 tablet (40 mg total) by mouth daily. 90 tablet 3  . HYDROcodone-acetaminophen (NORCO/VICODIN) 5-325 MG tablet Take 1 tablet by mouth every 4 (four) hours as needed for moderate pain.    . magnesium oxide (MAG-OX) 400 MG tablet Take 400 mg by mouth daily.     . metoprolol tartrate (LOPRESSOR) 25 MG tablet Take 25 mg by mouth 2 (two) times daily.    . SYMBICORT  160-4.5 MCG/ACT inhaler     . warfarin (COUMADIN) 5 MG tablet Take as directed by [facility] Clinic 30 tablet 3   No current facility-administered medications for this visit.      Allergies:   Aspirin and Codeine sulfate   Social History:  The patient  reports that he has quit smoking. He uses smokeless tobacco. He reports that he does not drink alcohol or use drugs.   Family History:   family history includes CAD in his mother; Diabetes in his other; Stroke in his mother.    Review of Systems: Review of Systems  Constitutional: Positive for malaise/fatigue.  Respiratory: Positive for shortness of breath.   Cardiovascular: Negative.   Gastrointestinal: Negative.   Musculoskeletal: Negative.   Neurological: Positive for weakness.  Psychiatric/Behavioral: Negative.   All other systems reviewed and are negative.    PHYSICAL EXAM: VS:  BP 110/68   Pulse (!) 58   Ht _0  (1.727 m)   Wt 155 lb (70.3 kg)   BMI 23.57 kg/m  , BMI Body mass index is 23.57 kg/m. GEN: Well nourished, well developed, in no acute distress  HEENT: normal  Neck: no JVD, carotid bruits, or masses Cardiac: Irregularly irregular , no murmurs, rubs, or gallops, no significant edema  Respiratory:  clear to auscultation bilaterally, normal work of breathing GI: soft, nontender, nondistended, + BS MS: no deformity or atrophy  Skin: warm and dry, no rash Neuro:  Strength and sensation are intact Psych: euthymic mood, full affect    Recent Labs: 05/11/2015: ALT 48 05/12/2015: TSH 1.925 06/10/2015: B Natriuretic Peptide 231.0 06/14/2015: Hemoglobin 13.8 08/08/2015: Platelets 230 08/12/2015: BUN 29; Creatinine, Ser 1.22; Potassium 5.2; Sodium 139    Lipid Panel No results found for: CHOL, HDL, LDLCALC, TRIG    Wt Readings from Last 3 Encounters:  03/07/16 155 lb (70.3 kg)  02/06/16 182 lb 8 oz (82.8 kg)  01/12/16 171 lb 4.8 oz (77.7 kg)       ASSESSMENT AND PLAN:  Paroxysmal atrial fibrillation  ([place]) - Plan: EKG 12-Lead Previously in normal sinus rhythm April 2017,  Continues to be in atrial fibrillation Improved CHF symptoms, rate well controlled though he is symptomatic with shortness of breath, weakness Recommended we try to restore normal sinus rhythm. INR has been therapeutic for one month and again today We will hold the metoprolol given low heart rate, start amiodarone load 400 mg twice a day for 5 days then 200 mg twice a day He will come back in next week for INR check 9:40 AM If he does not convert to normal sinus rhythm on amiodarone will schedule cardioversion  Essential hypertension Blood pressure adequate, we will hold metoprolol to make room for amiodarone  Coronary artery disease involving native coronary artery of native heart without angina pectoris Currently with no symptoms of angina. No further workup at this time. Continue current medication regimen.  Pure hypercholesterolemia Currently on Lipitor daily  Dyspnea on exertion Recommended he decrease Lasix down to  20 mg every other day We'll check basic metabolic panel  Type 2 diabetes mellitus with diabetic polyneuropathy, without long-term current use of insulin ([place]) We have encouraged continued exercise, careful diet management in an effort to lose weight.  Encounter for anticoagulation discussion and counseling Doing well on warfarin, INR 2.2 today  Acute diastolic CHF (congestive heart failure) (HCC) Fluid status markedly better, we'll order BMP   Total encounter time more than 25 minutes  Greater than 50% was spent in counseling and coordination of care with the patient   Disposition:   F/U 2 to  3 weeks     Signed, Esmond Plants, M.D., Ph.D. 03/07/2016  [place], [place]

## 2016-03-07 NOTE — Telephone Encounter (Signed)
Patient called and stated he has discussed care plan with wife and wants to go ahead and do a cardioversion.  Patient says he has been taking that medicine for a week now and he now wants to discuss cardioversion . Patient said he would stop taking medication that's not working then said he would continue taking.  Unable to get a clear answer after asking to clarify stopping or continuing this medication.  Please call to discuss.

## 2016-03-07 NOTE — Patient Instructions (Addendum)
Medication Instructions:   Please take 1/2 lasix/furosemide every other day  Stop the metoprolol  Start amiodarone two pill  twice a day for 5 days (Am and PM) Then down to one pill twice a day  Follow up in 2 to 3 weeks If still in atrial fibrillation at that time, we will schedule a cardioversion  Labwork:  BMP   Testing/Procedures:  No further testing at this time   I recommend watching educational videos on topics of interest to you at:       [email or web address]  Enter code: HEARTCARE    Follow-Up: It was a pleasure seeing you in the office today. Please call us if you have new issues that need to be addressed before your next appt.  [phone]  Your physician wants you to follow-up in: 2 to 3 weeks   If you need a refill on your cardiac medications before your next appointment, please call your pharmacy.

## 2016-03-07 NOTE — Telephone Encounter (Signed)
Called spoke with pt, changed appt for INR check from 03/14/16 to 03/22/16 since pt states he cannot afford to start on Amiodarone until he gets another check on 03/14/16.  Pt aware of new appt date and time, we will check his INR on 03/22/16 when he sees Dr Rockey Situ.

## 2016-03-08 NOTE — Telephone Encounter (Signed)
Pt is coming to see Dr Rockey Situ on 12/5

## 2016-03-14 ENCOUNTER — Encounter: Payer: Self-pay | Admitting: Family

## 2016-03-14 ENCOUNTER — Ambulatory Visit: Payer: Medicare Other | Attending: Family | Admitting: Family

## 2016-03-14 VITALS — BP 110/60 | HR 85 | Resp 18 | Ht 68.0 in | Wt 158.0 lb

## 2016-03-14 DIAGNOSIS — Z9049 Acquired absence of other specified parts of digestive tract: Secondary | ICD-10-CM | POA: Diagnosis not present

## 2016-03-14 DIAGNOSIS — I110 Hypertensive heart disease with heart failure: Secondary | ICD-10-CM | POA: Insufficient documentation

## 2016-03-14 DIAGNOSIS — Z7901 Long term (current) use of anticoagulants: Secondary | ICD-10-CM | POA: Diagnosis not present

## 2016-03-14 DIAGNOSIS — Z79899 Other long term (current) drug therapy: Secondary | ICD-10-CM | POA: Insufficient documentation

## 2016-03-14 DIAGNOSIS — E119 Type 2 diabetes mellitus without complications: Secondary | ICD-10-CM | POA: Insufficient documentation

## 2016-03-14 DIAGNOSIS — E1142 Type 2 diabetes mellitus with diabetic polyneuropathy: Secondary | ICD-10-CM

## 2016-03-14 DIAGNOSIS — I4819 Other persistent atrial fibrillation: Secondary | ICD-10-CM

## 2016-03-14 DIAGNOSIS — R5383 Other fatigue: Secondary | ICD-10-CM | POA: Diagnosis not present

## 2016-03-14 DIAGNOSIS — I4891 Unspecified atrial fibrillation: Secondary | ICD-10-CM | POA: Insufficient documentation

## 2016-03-14 DIAGNOSIS — Z9889 Other specified postprocedural states: Secondary | ICD-10-CM | POA: Diagnosis not present

## 2016-03-14 DIAGNOSIS — R0602 Shortness of breath: Secondary | ICD-10-CM | POA: Diagnosis not present

## 2016-03-14 DIAGNOSIS — Z87891 Personal history of nicotine dependence: Secondary | ICD-10-CM | POA: Insufficient documentation

## 2016-03-14 DIAGNOSIS — I10 Essential (primary) hypertension: Secondary | ICD-10-CM

## 2016-03-14 DIAGNOSIS — I5022 Chronic systolic (congestive) heart failure: Secondary | ICD-10-CM | POA: Diagnosis not present

## 2016-03-14 DIAGNOSIS — Z0001 Encounter for general adult medical examination with abnormal findings: Secondary | ICD-10-CM | POA: Insufficient documentation

## 2016-03-14 NOTE — Progress Notes (Signed)
 Patient ID: Gregory Tyler, male    DOB: 12/23/1940, 75 y.o.   MRN: [id number]  HPI Gregory Tyler is a 75 y/o male with a history of prostate cancer, HTN, hyperlipidemia, gout, gastric ulcer, emphysema, DM, CAD, depression, chronic atrial fibrillation, anxiety, anemia, remote tobacco use and chronic heart failure.  Last echo was done 04/25/15 and showed an EF of 35-40% along with mild/moderate Gregory and mildly elevated PA pressure of 40 mm Hg.   Was last admitted on 05/11/15 due to weakness and was noted to be in rapid atrial fibrillation. Cardiology consult was obtained. Also had some renal failure which improved with gentle IV hydration. TEE with cardioversion was done 06/17/15.  He presents today for a follow-up visit with fatigue and shortness of breath with little exertion. Symptoms do improve upon rest. Continues to weigh himself and has lost quite a bit of weight since he was last here. Is now taking 20mg furosemide every other day if needed for weight gain and/or swelling. Has had his amiodarone increased for the next 5 days with possible repeat cardioversion in the future.   Past Medical History:  Diagnosis Date  . Anemia   . Anxiety   . Arthritis   . CHF (congestive heart failure) (HCC)    Combined Systolic and Diastolic; EF 35-40% in 04/2015  . Chronic atrial fibrillation (HCC)    a. on Xarelto  . Coronary artery disease   . Depression   . Diabetes mellitus without complication (HCC)   . Dysrhythmia   . Emphysema lung (HCC)   . Gastric ulcer   . Gout   . Hyperlipidemia   . Hypertension   . Nephrolithiasis   . Prostate cancer (HCC)   . Shortness of breath dyspnea   . Weight loss    50 lb since Apr 24, 2015    Past Surgical History:  Procedure Laterality Date  . APPENDECTOMY    . BACK SURGERY    . CHOLECYSTECTOMY    . ELECTROPHYSIOLOGIC STUDY N/A 06/17/2015   Procedure: CARDIOVERSION;  Surgeon: Muhammad A Arida, MD;  Location: [facility] ORS;  Service: Cardiovascular;   Laterality: N/A;  . GASTRIC BYPASS    . HEMORRHOID SURGERY    . pilonidal cyst    . TEE WITHOUT CARDIOVERSION N/A 06/17/2015   Procedure: TRANSESOPHAGEAL ECHOCARDIOGRAM (TEE);  Surgeon: Muhammad A Arida, MD;  Location: [facility] ORS;  Service: Cardiovascular;  Laterality: N/A;    Family History  Problem Relation Age of Onset  . CAD Mother   . Stroke Mother   . Diabetes Other     Social History  Substance Use Topics  . Smoking status: Former Smoker  . Smokeless tobacco: Current User  . Alcohol use No    Allergies  Allergen Reactions  . Aspirin     GI Bleeding  . Codeine Sulfate     Other reaction(s): Other (See Comments) BLISTERS    Prior to Admission medications   Medication Sig Start Date End Date Taking? Authorizing Provider  albuterol (PROVENTIL HFA;VENTOLIN HFA) 108 (90 Base) MCG/ACT inhaler Inhale 2 puffs into the lungs every 6 (six) hours as needed for wheezing or shortness of breath. 08/08/15   Timothy J Gollan, MD  allopurinol (ZYLOPRIM) 300 MG tablet Take 100 mg by mouth every other day.    Historical Provider, MD  amiodarone (PACERONE) 200 MG tablet Take 1 tablet (200 mg total) by mouth 2 (two) times daily. 03/07/16   Timothy J Gollan, MD  atorvastatin (LIPITOR) 20 MG   tablet Take 20 mg by mouth daily.    Historical Provider, MD  BAYER CONTOUR TEST test strip as directed. 05/17/15   Historical Provider, MD  BAYER MICROLET LANCETS lancets 2 (two) times daily. for testing 05/13/15   Historical Provider, MD  Blood Glucose Monitoring Suppl (BAYER CONTOUR MONITOR) w/Device KIT 2 (two) times daily. for testing 05/13/15   Historical Provider, MD  diltiazem (CARDIZEM CD) 120 MG 24 hr capsule Take 1 capsule (120 mg total) by mouth daily. 08/22/15   Minna Merritts, MD         furosemide (LASIX) 20 MG tablet Take 1 tablet (20 mg total) by mouth daily as needed. 03/07/16 03/07/17  Minna Merritts, MD         magnesium oxide (MAG-OX) 400 MG tablet Take 400 mg by mouth daily.      Historical Provider, MD  [facility] 160-4.5 MCG/ACT inhaler  12/21/15   Historical Provider, MD  warfarin (COUMADIN) 5 MG tablet Take as directed by Coumadin Clinic 11/09/15   Minna Merritts, MD    Review of Systems  Constitutional: Positive for appetite change and fatigue.  HENT: Negative for congestion, postnasal drip and sore throat.   Eyes: Negative.   Respiratory: Positive for shortness of breath. Negative for cough and chest tightness.   Cardiovascular: Negative for chest pain and leg swelling.  Gastrointestinal: Negative for abdominal distention and abdominal pain.  Endocrine: Negative.   Genitourinary: Negative.   Musculoskeletal: Negative for back pain and neck pain.  Skin: Negative.   Allergic/Immunologic: Negative.   Neurological: Positive for numbness (left fingers numb at times). Negative for dizziness and light-headedness.  Hematological: Negative for adenopathy. Bruises/bleeds easily.  Psychiatric/Behavioral: Positive for sleep disturbance (sleeping on 2-3 pillows). Negative for dysphoric mood. The patient is not nervous/anxious.    Vitals:   03/14/16 1231  BP: 110/60  Pulse: 85  Resp: 18  SpO2: 100%  Weight: 158 lb (71.7 kg)  Height: 5' 8" (1.727 m)    Wt Readings from Last 3 Encounters:  03/14/16 158 lb (71.7 kg)  03/07/16 155 lb (70.3 kg)  02/06/16 182 lb 8 oz (82.8 kg)   Lab Results  Component Value Date   CREATININE 1.22 08/12/2015   CREATININE 1.23 08/08/2015   CREATININE 1.83 (H) 06/14/2015   Physical Exam  Constitutional: He is oriented to person, place, and time. He appears well-developed and well-nourished.  HENT:  Head: Normocephalic and atraumatic.  Eyes: Conjunctivae are normal. Pupils are equal, round, and reactive to light.  Neck: Normal range of motion. Neck supple. No JVD present.  Cardiovascular: Normal rate.  An irregular rhythm present.  Pulmonary/Chest: Effort normal. He has no wheezes. He has no rales.  Abdominal: Soft. He exhibits  no distension. There is no tenderness.  Musculoskeletal: He exhibits no edema or tenderness.  Neurological: He is alert and oriented to person, place, and time.  Skin: Skin is warm and dry.  Psychiatric: He has a normal mood and affect. His behavior is normal. Thought content normal.  Vitals reviewed.   Assessment & Plan:  1: Chronic heart failure with reduced ejection fraction- - NYHA class III - euvolemic - weight down 11.4 pounds since he was last here on 12/16/15. Reminded to call for an overnight weight gain of >2 pounds or a weekly weight gain of >5 pounds. Not adding salt to his food and says that it's difficult for him to find something to eat that doesn't have a lot of salt in it. -  received his flu vaccine for this season - saw cardiologist (Gollan) 03/07/16 and will see him again 03/27/16 - patient continues to not be interested in adding entresto  2: HTN- - BP looks good today - continue medications at this time  3: Atrial fibrillation- - metoprolol stopped today as he's being loaded with more amiodarone. For the next five days he'll take 400mg amiodarone BID and then go back to 200mg BID - may need another cardioversion if he doesn't convert back to NSR - continues to be followed in the coumadin clinic and goes back to them next week  4: Diabetes- - reports glucose running 118-120's in the mornings - sees PCP ([place]) on 04/04/16  Return here in 3 months or sooner for any questions/problems before then.   

## 2016-03-14 NOTE — Patient Instructions (Signed)
Continue weighing daily and call for an overnight weight gain of > 2 pounds or a weekly weight gain of >5 pounds. 

## 2016-03-22 ENCOUNTER — Telehealth: Payer: Self-pay | Admitting: *Deleted

## 2016-03-22 ENCOUNTER — Encounter (INDEPENDENT_AMBULATORY_CARE_PROVIDER_SITE_OTHER): Payer: Medicare Other

## 2016-03-22 ENCOUNTER — Ambulatory Visit: Payer: Medicare Other | Admitting: Cardiovascular Disease

## 2016-03-22 DIAGNOSIS — I481 Persistent atrial fibrillation: Secondary | ICD-10-CM | POA: Diagnosis not present

## 2016-03-22 DIAGNOSIS — Z7901 Long term (current) use of anticoagulants: Secondary | ICD-10-CM

## 2016-03-22 NOTE — Telephone Encounter (Signed)
Patient returning call. I apologized for the delay in his appt and that we did not check his coumadin prior to him needing to leave. Patient stated, "Do not worry about it one bit. My wife was calling and asking me to come back to her so I had to leave." Discussed with patient that he needed to have INR checked one week after starting Amiodarone per pharmacy and if he could come in later this afternoon or tomorrow morning. Patient stated it is hard bc he has to find someone to stay with his wife, no family in town this week.  He said he could come tomorrow at 0930. Appt scheduled for 03/22/16 at 0930 for INR check. Patient verbalized understanding.

## 2016-03-22 NOTE — Telephone Encounter (Signed)
Patient was here for appt to have Coumadin check.  Nurse was delayed in getting patient back to be checked so patient left prior to being seen by nurse.  Attempted to call to discuss. Left message to call us back. Patient needs to reschedule INR check for this afternoon or tomorrow.

## 2016-03-23 ENCOUNTER — Ambulatory Visit (INDEPENDENT_AMBULATORY_CARE_PROVIDER_SITE_OTHER): Payer: Medicare Other | Admitting: *Deleted

## 2016-03-23 DIAGNOSIS — Z7189 Other specified counseling: Secondary | ICD-10-CM

## 2016-03-23 DIAGNOSIS — I481 Persistent atrial fibrillation: Secondary | ICD-10-CM

## 2016-03-23 DIAGNOSIS — I2510 Atherosclerotic heart disease of native coronary artery without angina pectoris: Secondary | ICD-10-CM | POA: Diagnosis not present

## 2016-03-23 DIAGNOSIS — I4819 Other persistent atrial fibrillation: Secondary | ICD-10-CM

## 2016-03-23 LAB — POCT INR: INR: 3.2

## 2016-03-27 ENCOUNTER — Encounter: Payer: Self-pay | Admitting: Cardiovascular Disease

## 2016-03-27 ENCOUNTER — Ambulatory Visit (INDEPENDENT_AMBULATORY_CARE_PROVIDER_SITE_OTHER): Payer: Medicare Other

## 2016-03-27 ENCOUNTER — Ambulatory Visit (INDEPENDENT_AMBULATORY_CARE_PROVIDER_SITE_OTHER): Payer: Medicare Other | Admitting: Cardiovascular Disease

## 2016-03-27 ENCOUNTER — Ambulatory Visit: Payer: Medicare Other | Admitting: Cardiovascular Disease

## 2016-03-27 VITALS — BP 138/72 | HR 76 | Ht 68.0 in | Wt 161.5 lb

## 2016-03-27 DIAGNOSIS — E7800 Pure hypercholesterolemia, unspecified: Secondary | ICD-10-CM

## 2016-03-27 DIAGNOSIS — I481 Persistent atrial fibrillation: Secondary | ICD-10-CM

## 2016-03-27 DIAGNOSIS — I420 Dilated cardiomyopathy: Secondary | ICD-10-CM

## 2016-03-27 DIAGNOSIS — Z7189 Other specified counseling: Secondary | ICD-10-CM

## 2016-03-27 DIAGNOSIS — E1142 Type 2 diabetes mellitus with diabetic polyneuropathy: Secondary | ICD-10-CM | POA: Diagnosis not present

## 2016-03-27 DIAGNOSIS — Z7901 Long term (current) use of anticoagulants: Secondary | ICD-10-CM

## 2016-03-27 DIAGNOSIS — I10 Essential (primary) hypertension: Secondary | ICD-10-CM

## 2016-03-27 DIAGNOSIS — I2510 Atherosclerotic heart disease of native coronary artery without angina pectoris: Secondary | ICD-10-CM

## 2016-03-27 DIAGNOSIS — I4819 Other persistent atrial fibrillation: Secondary | ICD-10-CM

## 2016-03-27 LAB — POCT INR: INR: 4.1

## 2016-03-27 NOTE — Patient Instructions (Addendum)
Medication Instructions:   Please increase the amiodarone up to twice a day Please restart metoprolol 1/2 pill twice a day Stay on diltiazem  If weight goes up more than 3 pounds, take a full lasix daily until weight goes back down, then go back on 1/2 pill every other day   Labwork:  No new labs needed  Testing/Procedures:  We will schedule a cardioversion You are scheduled for a Cardioversion on Friday, December 8ith Dr. Rockey Situ Please arrive at the [facility] at 6:45 a.m. on the day of your procedure.  DIET INSTRUCTIONS:  Nothing to eat or drink after midnight except your medications with a sip of water.       Labs: BMET, CBC, PT/INR  1) Medications:  YOU MAY TAKE ALL of your remaining medications with a small amount of water.  2) Must have a responsible person to drive you home.  3) Bring a current list of your medications and current insurance cards.    If you have any questions after you get home, please call the office at [phone]    I recommend watching educational videos on topics of interest to you at:       [email or web address]  Enter code: HEARTCARE    Follow-Up: It was a pleasure seeing you in the office today. Please call us if you have new issues that need to be addressed before your next appt.  [phone]  Your physician wants you to follow-up in: 1 month.    If you need a refill on your cardiac medications before your next appointment, please call your pharmacy.     Electrical Cardioversion Electrical cardioversion is the delivery of a jolt of electricity to restore a normal rhythm to the heart. A rhythm that is too fast or is not regular keeps the heart from pumping well. In this procedure, sticky patches or metal paddles are placed on the chest to deliver electricity to the heart from a device. This procedure may be done in an emergency if:  There is low or no blood pressure as a result of the heart rhythm.  Normal rhythm must be  restored as fast as possible to protect the brain and heart from further damage.  It may save a life. This procedure may also be done for irregular or fast heart rhythms that are not immediately life-threatening. Tell a health care provider about:  Any allergies you have.  All medicines you are taking, including vitamins, herbs, eye drops, creams, and over-the-counter medicines.  Any problems you or family members have had with anesthetic medicines.  Any blood disorders you have.  Any surgeries you have had.  Any medical conditions you have.  Whether you are pregnant or may be pregnant. What are the risks? Generally, this is a safe procedure. However, problems may occur, including:  Allergic reactions to medicines.  A blood clot that breaks free and travels to other parts of your body.  The possible return of an abnormal heart rhythm within hours or days after the procedure.  Your heart stopping (cardiac arrest). This is rare. What happens before the procedure? Medicines  Your health care provider may have you start taking:  Blood-thinning medicines (anticoagulants) so your blood does not clot as easily.  Medicines may be given to help stabilize your heart rate and rhythm.  Ask your health care provider about changing or stopping your regular medicines. This is especially important if you are taking diabetes medicines or blood thinners. General instructions  Plan to have someone take you home from the hospital or clinic.  If you will be going home right after the procedure, plan to have someone with you for 24 hours.  Follow instructions from your health care provider about eating or drinking restrictions. What happens during the procedure?  To lower your risk of infection:  Your health care team will wash or sanitize their hands.  Your skin will be washed with soap.  An IV tube will be inserted into one of your veins.  You will be given a medicine to help you  relax (sedative).  Sticky patches (electrodes) or metal paddles may be placed on your chest.  An electrical shock will be delivered. The procedure may vary among health care providers and hospitals. What happens after the procedure?  Your blood pressure, heart rate, breathing rate, and blood oxygen level will be monitored until the medicines you were given have worn off.  Do not drive for 24 hours if you were given a sedative.  Your heart rhythm will be watched to make sure it does not change. This information is not intended to replace advice given to you by your health care provider. Make sure you discuss any questions you have with your health care provider. Document Released: 03/30/2002 Document Revised: 12/07/2015 Document Reviewed: 10/14/2015 Elsevier Interactive Patient Education  2017 [employer].  Electrical Cardioversion, Care After This sheet gives you information about how to care for yourself after your procedure. Your health care provider may also give you more specific instructions. If you have problems or questions, contact your health care provider. What can I expect after the procedure? After the procedure, it is common to have:  Some redness on the skin where the shocks were given. Follow these instructions at home:  Do not drive for 24 hours if you were given a medicine to help you relax (sedative).  Take over-the-counter and prescription medicines only as told by your health care provider.  Ask your health care provider how to check your pulse. Check it often.  Rest for 48 hours after the procedure or as told by your health care provider.  Avoid or limit your caffeine use as told by your health care provider. Contact a health care provider if:  You feel like your heart is beating too quickly or your pulse is not regular.  You have a serious muscle cramp that does not go away. Get help right away if:  You have discomfort in your chest.  You are dizzy  or you feel faint.  You have trouble breathing or you are short of breath.  Your speech is slurred.  You have trouble moving an arm or leg on one side of your body.  Your fingers or toes turn cold or blue. This information is not intended to replace advice given to you by your health care provider. Make sure you discuss any questions you have with your health care provider. Document Released: 01/28/2013 Document Revised: 11/11/2015 Document Reviewed: 10/14/2015 Elsevier Interactive Patient Education  2017 [employer].

## 2016-03-27 NOTE — Progress Notes (Signed)
Cardiology Office Note  Date:  03/27/2016   ID:  Gregory Tyler, DOB October 19, 1940, MRN [id number]  PCP:  [name], MD   Chief Complaint  Patient presents with  . other    Afib c/o sob and coughing. Meds reviewed verbally with pt.    HPI:  Gregory Tyler is a pleasant 75 year old gentleman With history of paroxysmal atrial fibrillation , diabetes, Who presented to the hospital 04/25/2015 with shortness of breath, Found to have atrial fibrillation with RVR, severe hypertension, Placed on BiPAP for respiratory distress. Recent cardioversion 06/17/2015 which was successful He presents for routine follow-up of his paroxysmal atrial fibrillation   Still SOB on exertion, Only taking amiodarone one a day Instead of two a day Weight stable, taking 1/2 pill every other day Up 2 pounds Wants cardioversion  On his last clinic visit he was back in atrial fibrillation, 20 pound weight gain from CHF He had scrotal edema, severe leg edema almost went to the emergency room for swelling of his penis  Denies any chest pain symptoms, no lightheadedness or dizziness  EKG on today's visit showing atrial fibrillation with ventricular rate 76 bpm, nonspecific T wave abnormality  Atrial fib in 12/16/2015 in CHF clinic per the exam note , rate >100   back in April 2017 was sinus rhythm   Other past medical history reviewed  in January 2017, blood pressure was 80 systolic, Lasix was held Symptoms of shortness of breath perhaps worse after he stopped his Symbicort 2 weeks ago. Does not have the money to refill   Previous echocardiogram June or 2017 with ejection fraction 35-40% while in atrial fibrillation   PMH:   has a past medical history of Anemia; Anxiety; Arthritis; CHF (congestive heart failure) ([place]); Chronic atrial fibrillation ([place]); Coronary artery disease; Depression; Diabetes mellitus without complication ([place]); Dysrhythmia; Emphysema lung ([place]); Gastric ulcer; Gout;  Hyperlipidemia; Hypertension; Nephrolithiasis; Prostate cancer ([place]); Shortness of breath dyspnea; and Weight loss.  PSH:    Past Surgical History:  Procedure Laterality Date  . APPENDECTOMY    . BACK SURGERY    . CHOLECYSTECTOMY    . ELECTROPHYSIOLOGIC STUDY N/A 06/17/2015   Procedure: CARDIOVERSION;  Surgeon: Wellington Hampshire, MD;  Location: [facility] ORS;  Service: Cardiovascular;  Laterality: N/A;  . GASTRIC BYPASS    . HEMORRHOID SURGERY    . pilonidal cyst    . TEE WITHOUT CARDIOVERSION N/A 06/17/2015   Procedure: TRANSESOPHAGEAL ECHOCARDIOGRAM (TEE);  Surgeon: Wellington Hampshire, MD;  Location: [facility] ORS;  Service: Cardiovascular;  Laterality: N/A;    Current Outpatient Prescriptions  Medication Sig Dispense Refill  . albuterol (PROVENTIL HFA;VENTOLIN HFA) 108 (90 Base) MCG/ACT inhaler Inhale 2 puffs into the lungs every 6 (six) hours as needed for wheezing or shortness of breath. 1 Inhaler 6  . allopurinol (ZYLOPRIM) 300 MG tablet Take 300 mg by mouth daily.     Marland Kitchen amiodarone (PACERONE) 200 MG tablet Take 1 tablet (200 mg total) by mouth 2 (two) times daily. 70 tablet 2  . atorvastatin (LIPITOR) 20 MG tablet Take 20 mg by mouth daily.    Marland Kitchen BAYER CONTOUR TEST test strip as directed.  12  . BAYER MICROLET LANCETS lancets 2 (two) times daily. for testing  0  . Blood Glucose Monitoring Suppl (BAYER CONTOUR MONITOR) w/Device KIT 2 (two) times daily. for testing  0  . diltiazem (CARDIZEM CD) 120 MG 24 hr capsule Take 1 capsule (120 mg total) by mouth daily. 60 capsule 3  .  furosemide (LASIX) 20 MG tablet Take 1 tablet (20 mg total) by mouth daily as needed. 90 tablet 3  . HYDROcodone-acetaminophen (NORCO/VICODIN) 5-325 MG tablet Take 1 tablet by mouth every 4 (four) hours as needed for moderate pain.    . magnesium oxide (MAG-OX) 400 MG tablet Take 400 mg by mouth daily.     . SYMBICORT 160-4.5 MCG/ACT inhaler Inhale 2 puffs into the lungs 2 (two) times daily.     Marland Kitchen warfarin (COUMADIN) 5 MG  tablet Take as directed by [facility] Clinic 30 tablet 3   No current facility-administered medications for this visit.      Allergies:   Aspirin and Codeine sulfate   Social History:  The patient  reports that he has quit smoking. He uses smokeless tobacco. He reports that he does not drink alcohol or use drugs.   Family History:   family history includes CAD in his mother; Diabetes in his other; Stroke in his mother.    Review of Systems: Review of Systems  Constitutional: Positive for malaise/fatigue.  Respiratory: Positive for shortness of breath.   Cardiovascular: Negative.   Gastrointestinal: Negative.   Musculoskeletal: Negative.   Neurological: Positive for weakness.  Psychiatric/Behavioral: Negative.   All other systems reviewed and are negative.    PHYSICAL EXAM: VS:  BP 138/72 (BP Location: Left Arm, Patient Position: Sitting, Cuff Size: Normal)   Pulse 76   Ht _0  (1.727 m)   Wt 161 lb 8 oz (73.3 kg)   BMI 24.56 kg/m  , BMI Body mass index is 24.56 kg/m. GEN: Well nourished, well developed, in no acute distress  HEENT: normal  Neck: no JVD, carotid bruits, or masses Cardiac: IRRR,  no murmurs, rubs, or gallops,no edema  Respiratory:  clear to auscultation bilaterally, normal work of breathing GI: soft, nontender, nondistended, + BS MS: no deformity or atrophy  Skin: warm and dry, no rash Neuro:  Strength and sensation are intact Psych: euthymic mood, full affect    Recent Labs: 05/11/2015: ALT 48 05/12/2015: TSH 1.925 06/10/2015: B Natriuretic Peptide 231.0 06/14/2015: Hemoglobin 13.8 08/08/2015: Platelets 230 08/12/2015: BUN 29; Creatinine, Ser 1.22; Potassium 5.2; Sodium 139    Lipid Panel No results found for: CHOL, HDL, LDLCALC, TRIG    Wt Readings from Last 3 Encounters:  03/27/16 161 lb 8 oz (73.3 kg)  03/14/16 158 lb (71.7 kg)  03/07/16 155 lb (70.3 kg)       ASSESSMENT AND PLAN: Persistent atrial fibrillation Will schedule a  cardioversion, this Friday Dec 8th Still with SOB. Sx may improve with restoring NSR  Coronary artery disease involving native coronary artery of native heart without angina pectoris Currently with no symptoms of angina. No further workup at this time. Continue current medication regimen.  Essential hypertension Blood pressure is well controlled on today's visit. No changes made to the medications. Congestive dilated cardiomyopathy (HCC) Appears euvolemic, no med changes Weight stable  Pure hypercholesterolemia On lipitor  Type 2 diabetes mellitus with diabetic polyneuropathy, without long-term current use of insulin ([place]) We have encouraged continued exercise, careful diet management in an effort to lose weight. Medications stopped  Encounter for anticoagulation discussion and counseling INR 4 today   Total encounter time more than 45 minutes  Greater than 50% was spent in counseling and coordination of care with the patient   Disposition:   F/U  1 month   Orders Placed This Encounter  Procedures  . EKG 12-Lead     Signed, Esmond Plants,  M.D., Ph.D. 03/27/2016  [place], [place]

## 2016-03-28 LAB — CBC WITH DIFFERENTIAL/PLATELET
BASOS ABS: 0 10*3/uL (ref 0.0–0.2)
Basos: 0 %
EOS (ABSOLUTE): 0.1 10*3/uL (ref 0.0–0.4)
Eos: 2 %
Hematocrit: 37.6 % (ref 37.5–51.0)
Hemoglobin: 12.2 g/dL — ABNORMAL LOW (ref 13.0–17.7)
Immature Grans (Abs): 0 10*3/uL (ref 0.0–0.1)
Immature Granulocytes: 0 %
LYMPHS ABS: 1.1 10*3/uL (ref 0.7–3.1)
Lymphs: 17 %
MCH: 27.7 pg (ref 26.6–33.0)
MCHC: 32.4 g/dL (ref 31.5–35.7)
MCV: 85 fL (ref 79–97)
MONOCYTES: 10 %
MONOS ABS: 0.7 10*3/uL (ref 0.1–0.9)
NEUTROS ABS: 4.6 10*3/uL (ref 1.4–7.0)
Neutrophils: 71 %
PLATELETS: 243 10*3/uL (ref 150–379)
RBC: 4.41 x10E6/uL (ref 4.14–5.80)
RDW: 16.1 % — AB (ref 12.3–15.4)
WBC: 6.5 10*3/uL (ref 3.4–10.8)

## 2016-03-28 LAB — BASIC METABOLIC PANEL
BUN / CREAT RATIO: 20 (ref 10–24)
BUN: 29 mg/dL — AB (ref 8–27)
CHLORIDE: 104 mmol/L (ref 96–106)
CO2: 21 mmol/L (ref 18–29)
Calcium: 8.9 mg/dL (ref 8.6–10.2)
Creatinine, Ser: 1.44 mg/dL — ABNORMAL HIGH (ref 0.76–1.27)
GFR calc Af Amer: 54 mL/min/{1.73_m2} — ABNORMAL LOW (ref >59)
GFR calc non Af Amer: 47 mL/min/{1.73_m2} — ABNORMAL LOW (ref >59)
GLUCOSE: 212 mg/dL — AB (ref 65–99)
POTASSIUM: 4.9 mmol/L (ref 3.5–5.2)
Sodium: 139 mmol/L (ref 134–144)

## 2016-03-28 LAB — PROTIME-INR
INR: 4.1 — AB (ref 0.8–1.2)
PROTHROMBIN TIME: 39.8 s — AB (ref 9.1–12.0)

## 2016-03-29 ENCOUNTER — Other Ambulatory Visit: Payer: Self-pay | Admitting: Cardiovascular Disease

## 2016-03-30 ENCOUNTER — Ambulatory Visit: Payer: Medicare Other | Admitting: Anesthesiology

## 2016-03-30 ENCOUNTER — Ambulatory Visit
Admission: RE | Admit: 2016-03-30 | Discharge: 2016-03-30 | Disposition: A | Discharge status: Home / Self Care | Payer: Medicare Other | Source: Ambulatory Visit | Attending: Cardiovascular Disease | Admitting: Cardiovascular Disease

## 2016-03-30 ENCOUNTER — Encounter
Admission: RE | Disposition: A | Discharge status: Home / Self Care | Payer: Self-pay | Source: Ambulatory Visit | Attending: Cardiovascular Disease

## 2016-03-30 ENCOUNTER — Encounter: Payer: Self-pay | Admitting: Cardiovascular Disease

## 2016-03-30 DIAGNOSIS — I509 Heart failure, unspecified: Secondary | ICD-10-CM | POA: Diagnosis not present

## 2016-03-30 DIAGNOSIS — Z8546 Personal history of malignant neoplasm of prostate: Secondary | ICD-10-CM | POA: Diagnosis not present

## 2016-03-30 DIAGNOSIS — E1142 Type 2 diabetes mellitus with diabetic polyneuropathy: Secondary | ICD-10-CM | POA: Diagnosis not present

## 2016-03-30 DIAGNOSIS — I2510 Atherosclerotic heart disease of native coronary artery without angina pectoris: Secondary | ICD-10-CM | POA: Diagnosis not present

## 2016-03-30 DIAGNOSIS — I481 Persistent atrial fibrillation: Secondary | ICD-10-CM | POA: Diagnosis not present

## 2016-03-30 DIAGNOSIS — Z7901 Long term (current) use of anticoagulants: Secondary | ICD-10-CM | POA: Diagnosis not present

## 2016-03-30 DIAGNOSIS — Z87891 Personal history of nicotine dependence: Secondary | ICD-10-CM | POA: Diagnosis not present

## 2016-03-30 DIAGNOSIS — R531 Weakness: Secondary | ICD-10-CM

## 2016-03-30 DIAGNOSIS — I480 Paroxysmal atrial fibrillation: Secondary | ICD-10-CM | POA: Diagnosis not present

## 2016-03-30 DIAGNOSIS — Z823 Family history of stroke: Secondary | ICD-10-CM | POA: Insufficient documentation

## 2016-03-30 DIAGNOSIS — I482 Chronic atrial fibrillation: Secondary | ICD-10-CM | POA: Diagnosis not present

## 2016-03-30 DIAGNOSIS — E7800 Pure hypercholesterolemia, unspecified: Secondary | ICD-10-CM | POA: Diagnosis not present

## 2016-03-30 DIAGNOSIS — J439 Emphysema, unspecified: Secondary | ICD-10-CM | POA: Diagnosis not present

## 2016-03-30 DIAGNOSIS — Z79899 Other long term (current) drug therapy: Secondary | ICD-10-CM | POA: Insufficient documentation

## 2016-03-30 DIAGNOSIS — M109 Gout, unspecified: Secondary | ICD-10-CM | POA: Diagnosis not present

## 2016-03-30 DIAGNOSIS — Z8249 Family history of ischemic heart disease and other diseases of the circulatory system: Secondary | ICD-10-CM | POA: Diagnosis not present

## 2016-03-30 DIAGNOSIS — I110 Hypertensive heart disease with heart failure: Secondary | ICD-10-CM | POA: Insufficient documentation

## 2016-03-30 HISTORY — PX: ELECTROPHYSIOLOGIC STUDY: SHX172A

## 2016-03-30 SURGERY — CARDIOVERSION (CATH LAB)
Anesthesia: General

## 2016-03-30 MED ORDER — SODIUM CHLORIDE 0.9 % IV SOLN
INTRAVENOUS | Status: DC | PRN
  Administered 2016-03-30: 08:00:00 via INTRAVENOUS

## 2016-03-30 MED ORDER — PROPOFOL 10 MG/ML IV BOLUS
INTRAVENOUS | Status: DC | PRN
  Administered 2016-03-30: 50 mg via INTRAVENOUS

## 2016-03-30 NOTE — Anesthesia Postprocedure Evaluation (Signed)
Anesthesia Post Note  Patient: Gregory Tyler  Procedure(s) Performed: Procedure(s) (LRB): CARDIOVERSION (N/A)  Patient location during evaluation: Other Anesthesia Type: General Level of consciousness: awake and alert and oriented Pain management: pain level controlled Vital Signs Assessment: post-procedure vital signs reviewed and stable Respiratory status: spontaneous breathing, nonlabored ventilation and respiratory function stable Cardiovascular status: blood pressure returned to baseline and stable Postop Assessment: no signs of nausea or vomiting Anesthetic complications: no    Last Vitals:  Vitals:   03/30/16 0815 03/30/16 0830  BP: 106/84 111/62  Pulse: (!) 50   Resp: 18 16  Temp:      Last Pain:  Vitals:   03/30/16 0648  TempSrc: Oral                 Tacara Hadlock

## 2016-03-30 NOTE — Discharge Instructions (Signed)
Electrical Cardioversion, Care After °This sheet gives you information about how to care for yourself after your procedure. Your health care provider may also give you more specific instructions. If you have problems or questions, contact your health care provider. °What can I expect after the procedure? °After the procedure, it is common to have: °· Some redness on the skin where the shocks were given. °Follow these instructions at home: °· Do not drive for 24 hours if you were given a medicine to help you relax (sedative). °· Take over-the-counter and prescription medicines only as told by your health care provider. °· Ask your health care provider how to check your pulse. Check it often. °· Rest for 48 hours after the procedure or as told by your health care provider. °· Avoid or limit your caffeine use as told by your health care provider. °Contact a health care provider if: °· You feel like your heart is beating too quickly or your pulse is not regular. °· You have a serious muscle cramp that does not go away. °Get help right away if: °· You have discomfort in your chest. °· You are dizzy or you feel faint. °· You have trouble breathing or you are short of breath. °· Your speech is slurred. °· You have trouble moving an arm or leg on one side of your body. °· Your fingers or toes turn cold or blue. °This information is not intended to replace advice given to you by your health care provider. Make sure you discuss any questions you have with your health care provider. °Document Released: 01/28/2013 Document Revised: 11/11/2015 Document Reviewed: 10/14/2015 °Elsevier Interactive Patient Education © 2017 [employer] ° °

## 2016-03-30 NOTE — Progress Notes (Signed)
Pt post cardioversion to sinus brady, clinically stable with vitals stable, son @ bedside. Dr Rockey Situ spoke with son with questions answered,

## 2016-03-30 NOTE — CV Procedure (Signed)
Cardioversion procedure note For atrial fibrillation, persistent.  Procedure Details:  Consent: Risks of procedure as well as the alternatives and risks of each were explained to the (patient/caregiver). Consent for procedure obtained.  Time Out: Verified patient identification, verified procedure, site/side was marked, verified correct patient position, special equipment/implants available, medications/allergies/relevent history reviewed, required imaging and test results available. Performed  Patient placed on cardiac monitor, pulse oximetry, supplemental oxygen as necessary.  Sedation given: propofol IV, Dr. Penwarden Pacer pads placed anterior and posterior chest.   Cardioverted 1 time(s).  Cardioverted at  150 J. Synchronized biphasic Converted to NSR   Evaluation: Findings: Post procedure EKG shows: NSR Complications: None Patient did tolerate procedure well.  Time Spent Directly with the Patient:  45 minutes   Tim Riley Papin, M.D., Ph.D. 

## 2016-03-30 NOTE — Transfer of Care (Signed)
Immediate Anesthesia Transfer of Care Note  Patient: Gregory Tyler  Procedure(s) Performed: Procedure(s): CARDIOVERSION (N/A)  Patient Location: PACU and Short Stay  Anesthesia Type:General  Level of Consciousness: awake, alert  and oriented  Airway & Oxygen Therapy: Patient Spontanous Breathing and Patient connected to nasal cannula oxygen  Post-op Assessment: Report given to RN and Post -op Vital signs reviewed and stable  Post vital signs: Reviewed and stable  Last Vitals:  Vitals:   03/30/16 0748 03/30/16 0749  BP: 107/62 107/62  Pulse: (!) 47 (!) 47  Resp: 12 13  Temp:      Complications: No apparent anesthesia complications

## 2016-03-30 NOTE — Anesthesia Preprocedure Evaluation (Signed)
Anesthesia Evaluation  Patient identified by MRN, date of birth, ID band Patient awake    Reviewed: Allergy & Precautions, NPO status , Patient's Chart, lab work & pertinent test results  History of Anesthesia Complications Negative for: history of anesthetic complications  Airway Mallampati: II  TM Distance: >3 FB Neck ROM: Full    Dental  (+) Edentulous Upper, Missing   Pulmonary shortness of breath, neg sleep apnea, COPD,  COPD inhaler, former smoker,    breath sounds clear to auscultation- rhonchi (-) wheezing      Cardiovascular hypertension, Pt. on medications + CAD and +CHF  + dysrhythmias Atrial Fibrillation  Rhythm:Regular Rate:Normal - Systolic murmurs and - Diastolic murmurs Echo 09/22/24: - Left ventricle: The cavity size was normal. Systolic function was   moderately reduced. The estimated ejection fraction was in the   range of 35% to 40%. Diffuse hypokinesis. Hypokinesis of the   anteroseptal and septal myocardium. The study is not technically   sufficient to allow evaluation of LV diastolic function. - Mitral valve: There was mild to moderate regurgitation. - Left atrium: The atrium was mild to moderately dilated. - Right ventricle: The cavity size was mildly dilated. Wall   thickness was normal. Systolic function was mildly reduced. - Right atrium: The atrium was mildly dilated. - Pulmonary arteries: Systolic pressure was mildly elevated. PA   peak pressure: 40 mm Hg (S).    Neuro/Psych Anxiety Depression negative neurological ROS     GI/Hepatic Neg liver ROS, PUD,   Endo/Other  diabetes (diet controlled), Type 2  Renal/GU Renal disease: hx of nephrolithiasis.     Musculoskeletal  (+) Arthritis ,   Abdominal (+) - obese,   Peds  Hematology  (+) anemia ,   Anesthesia Other Findings Past Medical History: No date: Anemia No date: Anxiety No date: Arthritis No date: CHF (congestive heart  failure) (HCC)     Comment: Combined Systolic and Diastolic; EF 33-35% in               04/2015 No date: Chronic atrial fibrillation (HCC)     Comment: a. on Xarelto No date: Coronary artery disease No date: Depression No date: Diabetes mellitus without complication (HCC) No date: Dysrhythmia No date: Emphysema lung (HCC) No date: Gastric ulcer No date: Gout No date: Hyperlipidemia No date: Hypertension No date: Nephrolithiasis No date: Prostate cancer ([place]) No date: Shortness of breath dyspnea No date: Weight loss     Comment: 50 lb since Apr 24, 2015   Reproductive/Obstetrics negative OB ROS                            Anesthesia Physical Anesthesia Plan  ASA: III  Anesthesia Plan: General   Post-op Pain Management:    Induction: Intravenous  Airway Management Planned: Natural Airway  Additional Equipment:   Intra-op Plan:   Post-operative Plan:   Informed Consent: I have reviewed the patients History and Physical, chart, labs and discussed the procedure including the risks, benefits and alternatives for the proposed anesthesia with the patient or authorized representative who has indicated his/her understanding and acceptance.   Dental advisory given  Plan Discussed with: CRNA and Anesthesiologist  Anesthesia Plan Comments:         Anesthesia Quick Evaluation

## 2016-04-04 ENCOUNTER — Ambulatory Visit (INDEPENDENT_AMBULATORY_CARE_PROVIDER_SITE_OTHER): Payer: Medicare Other

## 2016-04-04 DIAGNOSIS — I2510 Atherosclerotic heart disease of native coronary artery without angina pectoris: Secondary | ICD-10-CM | POA: Diagnosis not present

## 2016-04-04 DIAGNOSIS — Z7189 Other specified counseling: Secondary | ICD-10-CM | POA: Diagnosis not present

## 2016-04-04 DIAGNOSIS — I481 Persistent atrial fibrillation: Secondary | ICD-10-CM

## 2016-04-04 DIAGNOSIS — I4819 Other persistent atrial fibrillation: Secondary | ICD-10-CM

## 2016-04-04 DIAGNOSIS — Z7901 Long term (current) use of anticoagulants: Secondary | ICD-10-CM | POA: Diagnosis not present

## 2016-04-04 LAB — POCT INR: INR: 3

## 2016-04-11 ENCOUNTER — Telehealth: Payer: Self-pay

## 2016-04-11 ENCOUNTER — Ambulatory Visit (INDEPENDENT_AMBULATORY_CARE_PROVIDER_SITE_OTHER): Payer: Medicare Other

## 2016-04-11 DIAGNOSIS — Z7901 Long term (current) use of anticoagulants: Secondary | ICD-10-CM

## 2016-04-11 DIAGNOSIS — I4819 Other persistent atrial fibrillation: Secondary | ICD-10-CM

## 2016-04-11 DIAGNOSIS — I2510 Atherosclerotic heart disease of native coronary artery without angina pectoris: Secondary | ICD-10-CM

## 2016-04-11 DIAGNOSIS — I481 Persistent atrial fibrillation: Secondary | ICD-10-CM

## 2016-04-11 DIAGNOSIS — Z7189 Other specified counseling: Secondary | ICD-10-CM | POA: Diagnosis not present

## 2016-04-11 LAB — POCT INR: INR: 3.4

## 2016-04-11 NOTE — Telephone Encounter (Signed)
Pt was seen in Coumadin Clinic today, pt states he has a follow-up appt with Dr Rockey Situ s/p DCCV on 04/27/16.  Pt is on Amiodarone at the present, pt states Dr Rockey Situ told him he would be stopping the Amiodarone post DCCV.  Pt will run out of current Amiodarone rx/supply prior to f/u appt on 04/27/16, and doesn't want to refill secondary to cost if Dr Rockey Situ is going to stop at [place] on 04/27/16.  Please call pt and advise if he should refill Amiodarone rx or stop it.  Thanks

## 2016-04-12 NOTE — Telephone Encounter (Signed)
Reviewed instructions to refill amiodarone and continue taking 200mg  qd. Pt verbalized understanding and will pick up at [facility].

## 2016-04-12 NOTE — Telephone Encounter (Signed)
Gregory Tyler, I will have office nurses call He should stay on amiodarone 200 mg once a day He has recurrent atrial fib, drop in EF to 35% and acute CHF when he goes back into atrial fib Would renew medication

## 2016-04-27 ENCOUNTER — Ambulatory Visit (INDEPENDENT_AMBULATORY_CARE_PROVIDER_SITE_OTHER): Payer: Medicare Other | Admitting: Cardiovascular Disease

## 2016-04-27 ENCOUNTER — Encounter: Payer: Self-pay | Admitting: Cardiovascular Disease

## 2016-04-27 ENCOUNTER — Ambulatory Visit (INDEPENDENT_AMBULATORY_CARE_PROVIDER_SITE_OTHER): Payer: Medicare Other

## 2016-04-27 VITALS — BP 128/60 | HR 59 | Ht 68.0 in | Wt 169.8 lb

## 2016-04-27 DIAGNOSIS — I480 Paroxysmal atrial fibrillation: Secondary | ICD-10-CM | POA: Diagnosis not present

## 2016-04-27 DIAGNOSIS — R0602 Shortness of breath: Secondary | ICD-10-CM | POA: Diagnosis not present

## 2016-04-27 DIAGNOSIS — Z7189 Other specified counseling: Secondary | ICD-10-CM

## 2016-04-27 DIAGNOSIS — L299 Pruritus, unspecified: Secondary | ICD-10-CM

## 2016-04-27 DIAGNOSIS — E1142 Type 2 diabetes mellitus with diabetic polyneuropathy: Secondary | ICD-10-CM | POA: Diagnosis not present

## 2016-04-27 DIAGNOSIS — Z7901 Long term (current) use of anticoagulants: Secondary | ICD-10-CM | POA: Diagnosis not present

## 2016-04-27 DIAGNOSIS — I4819 Other persistent atrial fibrillation: Secondary | ICD-10-CM

## 2016-04-27 DIAGNOSIS — I481 Persistent atrial fibrillation: Secondary | ICD-10-CM | POA: Diagnosis not present

## 2016-04-27 DIAGNOSIS — I2510 Atherosclerotic heart disease of native coronary artery without angina pectoris: Secondary | ICD-10-CM

## 2016-04-27 LAB — POCT INR: INR: 3.3

## 2016-04-27 MED ORDER — AMIODARONE HCL 200 MG PO TABS: 200.0000 mg | ORAL_TABLET | Freq: Every day | ORAL | 3 refills | Status: DC

## 2016-04-27 MED ORDER — FUROSEMIDE 20 MG PO TABS: 20.0000 mg | ORAL_TABLET | Freq: Every day | ORAL | 3 refills | Status: DC | PRN

## 2016-04-27 MED ORDER — WARFARIN SODIUM 2.5 MG PO TABS: 2.5000 mg | ORAL_TABLET | Freq: Every day | ORAL | 3 refills | Status: DC

## 2016-04-27 NOTE — Patient Instructions (Addendum)
Medication Instructions:   Please decrease the amiodarone down to one a day  Please increase the lasix up to a whole pill  Monday, Wednesday, Friday, Saturday   Labwork:  No new labs needed  Testing/Procedures:  No further testing at this time   I recommend watching educational videos on topics of interest to you at:       [email or web address]  Enter code: [email or web address]    Follow-Up: It was a pleasure seeing you in the office today. Please call us if you have new issues that need to be addressed before your next appt.  [phone]  Your physician wants you to follow-up in: 6 months.  You will receive a reminder letter in the mail two months in advance. If you don't receive a letter, please call our office to schedule the follow-up appointment.  If you need a refill on your cardiac medications before your next appointment, please call your pharmacy.

## 2016-04-27 NOTE — Progress Notes (Signed)
Cardiology Office Note  Date:  04/27/2016   ID:  Gregory Tyler, DOB 10/15/1940, MRN [id number]  PCP:  [name], MD   Chief Complaint  Patient presents with  . other    F/U cardioversion. Meds reviewed by the pt. verbally. Pt. c/o dizziness.     HPI:  Gregory Tyler is a pleasant 76 year old gentleman With history of paroxysmal atrial fibrillation On warfarin, diabetes, Who presented to the hospital 04/25/2015 with shortness of breath, Found to have atrial fibrillation with RVR, severe hypertension, Placed on BiPAP for respiratory distress. Recent cardioversion 06/17/2015 which was successful He presents for routine follow-up of his paroxysmal atrial fibrillation , recent cardioversion 03/30/2016  On his last clinic visit we recommended he increase amiodarone up to twice a day dosing Cardioversion was scheduled December 8 This was successful in restoring normal sinus rhythm, Reports that his shortness of breath has dramatically improved, able to walk to get his mail without any significant symptoms  Main complaint is itching the past 3 days,  legs  Feels he may be retaining some fluid Legs and fingers feel tight Knees swollen He is taking Lasix one half pill every other day Previously noted to have elevated BUN/creatinine with overdiuresis 03/27/2016, Lasix dose was decreased. Was previously taking Lasix daily Reports his scrotal edema has resolved  Denies any chest pain symptoms, no lightheadedness or dizziness  EKG on today's visit showing normal sinus rhythm with intraventricular conduction delay rate 59 bpm  Other past medical history reviewed Atrial fib in 12/16/2015 in CHF clinic per the exam note , rate >100  back in April 2017 was sinus rhythm   Other past medical history reviewed in January 2017, blood pressure was 80 systolic, Lasix was held Symptoms of shortness of breath perhaps worse after he stopped his Symbicort 2 weeks ago. Does not have  the money to refill   Previous echocardiogram June or 2017 with ejection fraction 35-40% while in atrial fibrillation   PMH:   has a past medical history of Anemia; Anxiety; Arthritis; CHF (congestive heart failure) ([place]); Chronic atrial fibrillation ([place]); Coronary artery disease; Depression; Diabetes mellitus without complication ([place]); Dysrhythmia; Emphysema lung ([place]); Gastric ulcer; Gout; Hyperlipidemia; Hypertension; Nephrolithiasis; Prostate cancer ([place]); Shortness of breath dyspnea; and Weight loss.  PSH:    Past Surgical History:  Procedure Laterality Date  . APPENDECTOMY    . BACK SURGERY    . CHOLECYSTECTOMY    . ELECTROPHYSIOLOGIC STUDY N/A 06/17/2015   Procedure: CARDIOVERSION;  Surgeon: Wellington Hampshire, MD;  Location: [facility];  Service: Cardiovascular;  Laterality: N/A;  . ELECTROPHYSIOLOGIC STUDY N/A 03/30/2016   Procedure: CARDIOVERSION;  Surgeon: Minna Merritts, MD;  Location: [facility];  Service: Cardiovascular;  Laterality: N/A;  . GASTRIC BYPASS    . HEMORRHOID SURGERY    . pilonidal cyst    . TEE WITHOUT CARDIOVERSION N/A 06/17/2015   Procedure: TRANSESOPHAGEAL ECHOCARDIOGRAM (TEE);  Surgeon: Wellington Hampshire, MD;  Location: [facility];  Service: Cardiovascular;  Laterality: N/A;    Current Outpatient Prescriptions  Medication Sig Dispense Refill  . albuterol (PROVENTIL HFA;VENTOLIN HFA) 108 (90 Base) MCG/ACT inhaler Inhale 2 puffs into the lungs every 6 (six) hours as needed for wheezing or shortness of breath. 1 Inhaler 6  . allopurinol (ZYLOPRIM) 100 MG tablet Take 100 mg by mouth every other day.    Marland Kitchen amiodarone (PACERONE) 200 MG tablet Take 1 tablet (200 mg total) by mouth daily. 90 tablet 3  . diltiazem (CARDIZEM CD) 120  MG 24 hr capsule Take 1 capsule (120 mg total) by mouth daily. 60 capsule 3  . furosemide (LASIX) 20 MG tablet Take 1 tablet (20 mg total) by mouth daily as needed. 90 tablet 3  . HYDROcodone-acetaminophen (NORCO/VICODIN) 5-325 MG tablet Take  1 tablet by mouth every 4 (four) hours as needed for moderate pain.    . magnesium oxide (MAG-OX) 400 MG tablet Take 400 mg by mouth daily.     . metFORMIN (GLUCOPHAGE) 500 MG tablet Take by mouth 2 (two) times daily with a meal.    . metoprolol tartrate (LOPRESSOR) 25 MG tablet Take 12.5 mg by mouth 2 (two) times daily.    . sodium chloride (OCEAN) 0.65 % SOLN nasal spray Place 1 spray into both nostrils as needed for congestion.    . SYMBICORT 160-4.5 MCG/ACT inhaler Inhale 2 puffs into the lungs 2 (two) times daily.     Marland Kitchen warfarin (COUMADIN) 2.5 MG tablet Take 1 tablet (2.5 mg total) by mouth daily. 40 tablet 3   No current facility-administered medications for this visit.      Allergies:   Aspirin and Codeine sulfate   Social History:  The patient  reports that he has quit smoking. He uses smokeless tobacco. He reports that he does not drink alcohol or use drugs.   Family History:   family history includes CAD in his mother; Diabetes in his other; Stroke in his mother.    Review of Systems: Review of Systems  Constitutional: Negative.   Respiratory: Negative.   Cardiovascular: Positive for leg swelling.  Gastrointestinal: Negative.   Musculoskeletal: Negative.   Skin: Positive for itching.  Neurological: Negative.   Psychiatric/Behavioral: Negative.   All other systems reviewed and are negative.    PHYSICAL EXAM: VS:  BP 128/60 (BP Location: Left Arm, Patient Position: Sitting, Cuff Size: Normal)   Pulse (!) 59   Ht 5\' 8"  (1.727 m)   Wt 169 lb 12 oz (77 kg)   BMI 25.81 kg/m  , BMI Body mass index is 25.81 kg/m. GEN: Well nourished, well developed, in no acute distress  HEENT: normal  Neck: no JVD, carotid bruits, or masses Cardiac: RRR; 2+ SEM RSB,  no rubs, or gallops,no edema  Respiratory:  clear to auscultation bilaterally, normal work of breathing GI: soft, nontender, nondistended, + BS MS: no deformity or atrophy  Skin: warm and dry, no rash Neuro:  Strength  and sensation are intact Psych: euthymic mood, full affect    Recent Labs: 05/11/2015: ALT 48 05/12/2015: TSH 1.925 06/10/2015: B Natriuretic Peptide 231.0 06/14/2015: Hemoglobin 13.8 03/27/2016: BUN 29; Creatinine, Ser 1.44; Platelets 243; Potassium 4.9; Sodium 139    Lipid Panel No results found for: CHOL, HDL, LDLCALC, TRIG    Wt Readings from Last 3 Encounters:  04/27/16 169 lb 12 oz (77 kg)  03/30/16 161 lb (73 kg)  03/27/16 161 lb 8 oz (73.3 kg)       ASSESSMENT AND PLAN:  Coronary artery disease involving native coronary artery of native heart without angina pectoris - Plan: EKG 12-Lead Currently with no symptoms of angina. No further workup at this time. Continue current medication regimen.  Encounter for anticoagulation discussion and counseling  INR checked today Continue warfarin indefinitely  Type 2 diabetes mellitus with diabetic polyneuropathy, without long-term current use of insulin ([place]) We have encouraged continued exercise, careful diet management   SOB (shortness of breath) - Plan: EKG 12-Lead Dramatic improvement in his shortness of breath. Restoring normal  sinus rhythm  Some signs of acute on chronic diastolic CHF with worsening leg swelling today  We'll increase Lasix up to Monday, Wednesday, Friday and Saturday 20 mg pill   Paroxysmal atrial fibrillation (HCC) - Plan: EKG 12-Lead Maintaining oral sinus rhythm Recommended he decrease his amiodarone down to 200 mg daily  Rash/itching Etiology unclear We'll decrease amiodarone   Total encounter time more than 25 minutes  Greater than 50% was spent in counseling and coordination of care with the patient   Disposition:   F/U  6 months   Orders Placed This Encounter  Procedures  . EKG 12-Lead     Signed, Esmond Plants, M.D., Ph.D. 04/27/2016  [place], [place]

## 2016-04-29 ENCOUNTER — Other Ambulatory Visit: Payer: Self-pay | Admitting: Cardiovascular Disease

## 2016-05-16 ENCOUNTER — Ambulatory Visit (INDEPENDENT_AMBULATORY_CARE_PROVIDER_SITE_OTHER): Payer: Medicare Other | Admitting: *Deleted

## 2016-05-16 DIAGNOSIS — Z7901 Long term (current) use of anticoagulants: Secondary | ICD-10-CM

## 2016-05-16 DIAGNOSIS — I481 Persistent atrial fibrillation: Secondary | ICD-10-CM | POA: Diagnosis not present

## 2016-05-16 DIAGNOSIS — I2510 Atherosclerotic heart disease of native coronary artery without angina pectoris: Secondary | ICD-10-CM

## 2016-05-16 DIAGNOSIS — Z7189 Other specified counseling: Secondary | ICD-10-CM | POA: Diagnosis not present

## 2016-05-16 DIAGNOSIS — I4819 Other persistent atrial fibrillation: Secondary | ICD-10-CM

## 2016-05-16 LAB — POCT INR: INR: 2.4

## 2016-06-06 ENCOUNTER — Ambulatory Visit (INDEPENDENT_AMBULATORY_CARE_PROVIDER_SITE_OTHER): Payer: Medicare Other

## 2016-06-06 DIAGNOSIS — Z7189 Other specified counseling: Secondary | ICD-10-CM

## 2016-06-06 DIAGNOSIS — Z7901 Long term (current) use of anticoagulants: Secondary | ICD-10-CM

## 2016-06-06 DIAGNOSIS — I2510 Atherosclerotic heart disease of native coronary artery without angina pectoris: Secondary | ICD-10-CM

## 2016-06-06 DIAGNOSIS — I481 Persistent atrial fibrillation: Secondary | ICD-10-CM

## 2016-06-06 DIAGNOSIS — I4819 Other persistent atrial fibrillation: Secondary | ICD-10-CM

## 2016-06-06 LAB — POCT INR: INR: 2.2

## 2016-06-14 ENCOUNTER — Ambulatory Visit: Payer: Medicare Other | Attending: Family | Admitting: Family

## 2016-06-14 ENCOUNTER — Encounter: Payer: Self-pay | Admitting: Family

## 2016-06-14 VITALS — BP 129/63 | HR 53 | Resp 18 | Ht 68.0 in | Wt 167.0 lb

## 2016-06-14 DIAGNOSIS — Z7901 Long term (current) use of anticoagulants: Secondary | ICD-10-CM | POA: Diagnosis not present

## 2016-06-14 DIAGNOSIS — Z87891 Personal history of nicotine dependence: Secondary | ICD-10-CM | POA: Diagnosis not present

## 2016-06-14 DIAGNOSIS — Z823 Family history of stroke: Secondary | ICD-10-CM | POA: Diagnosis not present

## 2016-06-14 DIAGNOSIS — I5022 Chronic systolic (congestive) heart failure: Secondary | ICD-10-CM | POA: Diagnosis not present

## 2016-06-14 DIAGNOSIS — Z8719 Personal history of other diseases of the digestive system: Secondary | ICD-10-CM | POA: Insufficient documentation

## 2016-06-14 DIAGNOSIS — Z8546 Personal history of malignant neoplasm of prostate: Secondary | ICD-10-CM | POA: Diagnosis not present

## 2016-06-14 DIAGNOSIS — I10 Essential (primary) hypertension: Secondary | ICD-10-CM

## 2016-06-14 DIAGNOSIS — Z79899 Other long term (current) drug therapy: Secondary | ICD-10-CM | POA: Insufficient documentation

## 2016-06-14 DIAGNOSIS — Z9889 Other specified postprocedural states: Secondary | ICD-10-CM | POA: Diagnosis not present

## 2016-06-14 DIAGNOSIS — I110 Hypertensive heart disease with heart failure: Secondary | ICD-10-CM | POA: Insufficient documentation

## 2016-06-14 DIAGNOSIS — Z8711 Personal history of peptic ulcer disease: Secondary | ICD-10-CM | POA: Diagnosis not present

## 2016-06-14 DIAGNOSIS — Z7984 Long term (current) use of oral hypoglycemic drugs: Secondary | ICD-10-CM | POA: Insufficient documentation

## 2016-06-14 DIAGNOSIS — Z5189 Encounter for other specified aftercare: Secondary | ICD-10-CM | POA: Insufficient documentation

## 2016-06-14 DIAGNOSIS — Z8249 Family history of ischemic heart disease and other diseases of the circulatory system: Secondary | ICD-10-CM | POA: Diagnosis not present

## 2016-06-14 DIAGNOSIS — Z9049 Acquired absence of other specified parts of digestive tract: Secondary | ICD-10-CM | POA: Insufficient documentation

## 2016-06-14 DIAGNOSIS — E119 Type 2 diabetes mellitus without complications: Secondary | ICD-10-CM | POA: Insufficient documentation

## 2016-06-14 DIAGNOSIS — I482 Chronic atrial fibrillation, unspecified: Secondary | ICD-10-CM

## 2016-06-14 DIAGNOSIS — I4891 Unspecified atrial fibrillation: Secondary | ICD-10-CM | POA: Insufficient documentation

## 2016-06-14 DIAGNOSIS — E1142 Type 2 diabetes mellitus with diabetic polyneuropathy: Secondary | ICD-10-CM

## 2016-06-14 DIAGNOSIS — Z833 Family history of diabetes mellitus: Secondary | ICD-10-CM | POA: Diagnosis not present

## 2016-06-14 NOTE — Patient Instructions (Signed)
Continue weighing daily and call for an overnight weight gain of > 2 pounds or a weekly weight gain of >5 pounds. 

## 2016-06-14 NOTE — Progress Notes (Signed)
Patient ID: Gregory Tyler, male    DOB: 1941-03-25, 76 y.o.   MRN: [id number]  HPI  Gregory Tyler is a 76 y/o male with a history of prostate cancer, HTN, hyperlipidemia, gout, gastric ulcer, emphysema, DM, CAD, depression, chronic atrial fibrillation, anxiety, anemia, remote tobacco use and chronic heart failure.  Last echo was done 04/25/15 and showed an EF of 35-40% along with mild/moderate Gregory and mildly elevated PA pressure of 40 mm Hg.   Had cardioversion done 03/30/16. TEE with cardioversion was done 06/17/15. Was last admitted on 05/11/15 due to weakness and was noted to be in rapid atrial fibrillation. Cardiology consult was obtained. Also had some renal failure which improved with gentle IV hydration.   He presents today for a follow-up visit with fatigue with little exertion. Fatigue improves quickly upon rest. No shortness of breath since the cardioversion. Continues to weigh himself and has put back on some weight. Is now taking 20mg  furosemide every day as needed for weight gain and/or swelling.   Past Medical History:  Diagnosis Date  . Anemia   . Anxiety   . Arthritis   . CHF (congestive heart failure) (HCC)    Combined Systolic and Diastolic; EF 16-60% in 63/0160  . Chronic atrial fibrillation (HCC)    a. on Xarelto  . Coronary artery disease   . Depression   . Diabetes mellitus without complication ([place])   . Dysrhythmia   . Emphysema lung ([place])   . Gastric ulcer   . Gout   . Hyperlipidemia   . Hypertension   . Nephrolithiasis   . Prostate cancer ([place])   . Shortness of breath dyspnea   . Weight loss    50 lb since Apr 24, 2015   Past Surgical History:  Procedure Laterality Date  . APPENDECTOMY    . BACK SURGERY    . CHOLECYSTECTOMY    . ELECTROPHYSIOLOGIC STUDY N/A 06/17/2015   Procedure: CARDIOVERSION;  Surgeon: Wellington Hampshire, MD;  Location: [facility];  Service: Cardiovascular;  Laterality: N/A;  . ELECTROPHYSIOLOGIC STUDY N/A 03/30/2016   Procedure:  CARDIOVERSION;  Surgeon: Minna Merritts, MD;  Location: [facility];  Service: Cardiovascular;  Laterality: N/A;  . GASTRIC BYPASS    . HEMORRHOID SURGERY    . pilonidal cyst    . TEE WITHOUT CARDIOVERSION N/A 06/17/2015   Procedure: TRANSESOPHAGEAL ECHOCARDIOGRAM (TEE);  Surgeon: Wellington Hampshire, MD;  Location: [facility];  Service: Cardiovascular;  Laterality: N/A;   Family History  Problem Relation Age of Onset  . CAD Mother   . Stroke Mother   . Diabetes Other    Social History  Substance Use Topics  . Smoking status: Former Research scientist (life sciences)  . Smokeless tobacco: Former Systems developer  . Alcohol use No   Allergies  Allergen Reactions  . Aspirin     GI Bleeding  . Codeine Sulfate     Other reaction(s): Other (See Comments) BLISTERS   Prior to Admission medications   Medication Sig Start Date End Date Taking? Authorizing Provider  albuterol (PROVENTIL HFA;VENTOLIN HFA) 108 (90 Base) MCG/ACT inhaler Inhale 2 puffs into the lungs every 6 (six) hours as needed for wheezing or shortness of breath. 08/08/15  Yes Minna Merritts, MD  amiodarone (PACERONE) 200 MG tablet Take 1 tablet (200 mg total) by mouth daily. 04/27/16  Yes Minna Merritts, MD  atorvastatin (LIPITOR) 20 MG tablet Take 20 mg by mouth daily.   Yes Historical Provider, MD  CARTIA XT 120  MG 24 hr capsule TAKE ONE CAPSULE BY MOUTH ONCE DAILY 04/30/16  Yes Minna Merritts, MD  furosemide (LASIX) 20 MG tablet Take 1 tablet (20 mg total) by mouth daily as needed. 04/27/16 04/27/17 Yes Minna Merritts, MD  HYDROcodone-acetaminophen (NORCO/VICODIN) 5-325 MG tablet Take 1 tablet by mouth every 4 (four) hours as needed for moderate pain.   Yes Historical Provider, MD  magnesium oxide (MAG-OX) 400 MG tablet Take 400 mg by mouth daily.    Yes Historical Provider, MD  metFORMIN (GLUCOPHAGE) 500 MG tablet Take by mouth 2 (two) times daily with a meal.   Yes Historical Provider, MD  metoprolol tartrate (LOPRESSOR) 25 MG tablet Take 12.5 mg by mouth 2 (two)  times daily. 02/27/16  Yes Historical Provider, MD  sodium chloride (OCEAN) 0.65 % SOLN nasal spray Place 1 spray into both nostrils as needed for congestion.   Yes Historical Provider, MD  SYMBICORT 160-4.5 MCG/ACT inhaler Inhale 2 puffs into the lungs 2 (two) times daily.  12/21/15  Yes Historical Provider, MD  warfarin (COUMADIN) 2.5 MG tablet Take 1 tablet (2.5 mg total) by mouth daily. 04/27/16  Yes Minna Merritts, MD  allopurinol (ZYLOPRIM) 100 MG tablet Take 100 mg by mouth every other day.    Historical Provider, MD     Review of Systems  Constitutional: Positive for fatigue. Negative for appetite change.  HENT: Negative for congestion and postnasal drip.   Eyes: Negative.   Respiratory: Negative for chest tightness and shortness of breath.   Cardiovascular: Negative for chest pain, palpitations and leg swelling.  Gastrointestinal: Negative for abdominal distention and abdominal pain.  Endocrine: Negative.   Genitourinary: Negative.   Musculoskeletal: Positive for back pain. Negative for neck pain.  Skin: Negative.   Allergic/Immunologic: Negative.   Neurological: Positive for light-headedness and numbness (tingling in feet). Negative for dizziness.  Hematological: Negative for adenopathy. Bruises/bleeds easily.  Psychiatric/Behavioral: Positive for sleep disturbance (due to wife's sleep pattern). Negative for dysphoric mood. The patient is not nervous/anxious.    Vitals:   06/14/16 1257  BP: 129/63  Pulse: (!) 53  Resp: 18  SpO2: 98%  Weight: 167 lb (75.8 kg)  Height: 5\' 8"  (1.727 m)   Wt Readings from Last 3 Encounters:  06/14/16 167 lb (75.8 kg)  04/27/16 169 lb 12 oz (77 kg)  03/30/16 161 lb (73 kg)   Lab Results  Component Value Date   CREATININE 1.44 (H) 03/27/2016   CREATININE 1.22 08/12/2015   CREATININE 1.23 08/08/2015    Physical Exam  Constitutional: He is oriented to person, place, and time. He appears well-developed and well-nourished.  HENT:  Head:  Normocephalic and atraumatic.  Eyes: Conjunctivae are normal. Pupils are equal, round, and reactive to light.  Neck: Normal range of motion. Neck supple. No JVD present.  Cardiovascular: Regular rhythm.  Bradycardia present.   Pulmonary/Chest: Effort normal. He has no wheezes. He has no rales.  Abdominal: Soft. He exhibits no distension. There is no tenderness.  Musculoskeletal: He exhibits no edema or tenderness.  Neurological: He is alert and oriented to person, place, and time.  Skin: Skin is warm and dry.  Psychiatric: He has a normal mood and affect. His behavior is normal. Thought content normal.  Nursing note and vitals reviewed.  Assessment & Plan:  1: Chronic heart failure with reduced ejection fraction- - NYHA class III - euvolemic - weight up 9 pounds since he was last here on 03/14/16. Reminded to call for an  overnight weight gain of >2 pounds or a weekly weight gain of >5 pounds. Not adding salt to his food although he says that it's difficult for him to find something to eat that doesn't have a lot of salt in it. Tends to eat out often due to his wife's health. - received his flu vaccine for this season - saw cardiologist Rockey Situ) 04/27/16 - patient continues to not be interested in adding entresto  2: HTN- - BP looks good today - continue medications at this time  3: Atrial fibrillation- - successfully cardioverted December 2017 - taking amiodarone, cartia XT, metoprolol and warfarin - continues to be followed in the coumadin clinic and goes back to them next week  4: Diabetes- - glucose was 132 this morning - saw PCP ([place]) on 04/04/17 and returns to him 07/09/16  Medication bottles were reviewed.  Return here in 3 months or sooner for any questions/problems before then.

## 2016-06-18 ENCOUNTER — Encounter: Payer: Self-pay | Admitting: Family

## 2016-07-04 ENCOUNTER — Ambulatory Visit (INDEPENDENT_AMBULATORY_CARE_PROVIDER_SITE_OTHER): Payer: Medicare Other

## 2016-07-04 DIAGNOSIS — Z7189 Other specified counseling: Secondary | ICD-10-CM

## 2016-07-04 DIAGNOSIS — I2510 Atherosclerotic heart disease of native coronary artery without angina pectoris: Secondary | ICD-10-CM

## 2016-07-04 DIAGNOSIS — I481 Persistent atrial fibrillation: Secondary | ICD-10-CM | POA: Diagnosis not present

## 2016-07-04 DIAGNOSIS — Z7901 Long term (current) use of anticoagulants: Secondary | ICD-10-CM | POA: Diagnosis not present

## 2016-07-04 DIAGNOSIS — I4819 Other persistent atrial fibrillation: Secondary | ICD-10-CM

## 2016-07-04 LAB — POCT INR: INR: 1.6

## 2016-07-18 ENCOUNTER — Ambulatory Visit (INDEPENDENT_AMBULATORY_CARE_PROVIDER_SITE_OTHER): Payer: Medicare Other

## 2016-07-18 DIAGNOSIS — I481 Persistent atrial fibrillation: Secondary | ICD-10-CM | POA: Diagnosis not present

## 2016-07-18 DIAGNOSIS — Z7189 Other specified counseling: Secondary | ICD-10-CM

## 2016-07-18 DIAGNOSIS — I4819 Other persistent atrial fibrillation: Secondary | ICD-10-CM

## 2016-07-18 DIAGNOSIS — Z7901 Long term (current) use of anticoagulants: Secondary | ICD-10-CM

## 2016-07-18 DIAGNOSIS — I2510 Atherosclerotic heart disease of native coronary artery without angina pectoris: Secondary | ICD-10-CM

## 2016-07-18 LAB — POCT INR: INR: 2.8

## 2016-08-15 ENCOUNTER — Ambulatory Visit (INDEPENDENT_AMBULATORY_CARE_PROVIDER_SITE_OTHER): Payer: Medicare Other

## 2016-08-15 DIAGNOSIS — I481 Persistent atrial fibrillation: Secondary | ICD-10-CM | POA: Diagnosis not present

## 2016-08-15 DIAGNOSIS — I2510 Atherosclerotic heart disease of native coronary artery without angina pectoris: Secondary | ICD-10-CM

## 2016-08-15 DIAGNOSIS — Z7189 Other specified counseling: Secondary | ICD-10-CM

## 2016-08-15 DIAGNOSIS — Z7901 Long term (current) use of anticoagulants: Secondary | ICD-10-CM | POA: Diagnosis not present

## 2016-08-15 DIAGNOSIS — I4819 Other persistent atrial fibrillation: Secondary | ICD-10-CM

## 2016-08-15 LAB — POCT INR: INR: 1.8

## 2016-08-27 ENCOUNTER — Other Ambulatory Visit: Payer: Self-pay | Admitting: Cardiovascular Disease

## 2016-09-09 ENCOUNTER — Other Ambulatory Visit: Payer: Self-pay | Admitting: Cardiovascular Disease

## 2016-09-12 ENCOUNTER — Ambulatory Visit (INDEPENDENT_AMBULATORY_CARE_PROVIDER_SITE_OTHER): Payer: Medicare Other

## 2016-09-12 DIAGNOSIS — Z7189 Other specified counseling: Secondary | ICD-10-CM

## 2016-09-12 DIAGNOSIS — Z7901 Long term (current) use of anticoagulants: Secondary | ICD-10-CM

## 2016-09-12 DIAGNOSIS — I481 Persistent atrial fibrillation: Secondary | ICD-10-CM | POA: Diagnosis not present

## 2016-09-12 DIAGNOSIS — I4819 Other persistent atrial fibrillation: Secondary | ICD-10-CM

## 2016-09-12 DIAGNOSIS — I2510 Atherosclerotic heart disease of native coronary artery without angina pectoris: Secondary | ICD-10-CM

## 2016-09-12 LAB — POCT INR: INR: 1.9

## 2016-09-12 MED ORDER — WARFARIN SODIUM 2.5 MG PO TABS: 2.5000 mg | ORAL_TABLET | Freq: Every day | ORAL | 3 refills | Status: DC

## 2016-09-13 ENCOUNTER — Ambulatory Visit: Payer: Medicare Other | Admitting: Family

## 2016-10-10 ENCOUNTER — Ambulatory Visit (INDEPENDENT_AMBULATORY_CARE_PROVIDER_SITE_OTHER): Payer: Medicare Other | Admitting: *Deleted

## 2016-10-10 DIAGNOSIS — I481 Persistent atrial fibrillation: Secondary | ICD-10-CM | POA: Diagnosis not present

## 2016-10-10 DIAGNOSIS — Z7189 Other specified counseling: Secondary | ICD-10-CM

## 2016-10-10 DIAGNOSIS — I2510 Atherosclerotic heart disease of native coronary artery without angina pectoris: Secondary | ICD-10-CM

## 2016-10-10 DIAGNOSIS — Z7901 Long term (current) use of anticoagulants: Secondary | ICD-10-CM

## 2016-10-10 DIAGNOSIS — I4819 Other persistent atrial fibrillation: Secondary | ICD-10-CM

## 2016-10-10 LAB — POCT INR: INR: 2.6

## 2016-10-25 DIAGNOSIS — I480 Paroxysmal atrial fibrillation: Secondary | ICD-10-CM | POA: Insufficient documentation

## 2016-10-25 NOTE — Progress Notes (Signed)
Cardiology Office Note  Date:  10/26/2016   ID:  Gregory Tyler, DOB 01/20/1941, MRN [id number]  PCP:  [name], MD   Chief Complaint  Patient presents with  . other    6 month f/u back pain due to fall. Meds reviewed verbally with pt.    HPI:   [name] is a pleasant 76 year old gentleman With history of  paroxysmal atrial fibrillation On warfarin,  diabetes,  hospital 04/25/2015 with shortness of breath,  Found to have atrial fibrillation with RVR, severe hypertension,  Placed on BiPAP for respiratory distress.  cardioversion 06/17/2015 which was successful He presents for routine follow-up of his paroxysmal atrial fibrillation , cardioversion 03/30/2016  Wife fell, tried to pick her up He fell, hurt his back Still hurting, slow to get better Denies SOB Skipping lasix   retaining some fluid, ankle swelling Legs and fingers feel tight Knees swollen  "can't hold my water" Was supposed to see Dr. Jacqlyn Larsen   elevated BUN/creatinine with overdiuresis 03/27/2016, Lasix dose was decreased. Was previously taking Lasix daily  Denies any chest pain symptoms, no lightheadedness or dizziness  EKG on today's visit showing normal sinus rhythm with intraventricular conduction delay rate 55 bpm  Other past medical history reviewed Atrial fib in 12/16/2015 in CHF clinic per the exam note , rate >100  back in April 2017 was sinus rhythm   Other past medical history reviewed in January 2017, blood pressure was 80 systolic, Lasix was held Symptoms of shortness of breath perhaps worse after he stopped his Symbicort 2 weeks ago. Does not have the money to refill   Previous echocardiogram June or 2017 with ejection fraction 35-40% while in atrial fibrillation   PMH:   has a past medical history of Anemia; Anxiety; Arthritis; CHF (congestive heart failure) ([place]); Chronic atrial fibrillation ([place]); Coronary artery disease; Depression; Diabetes mellitus without  complication ([place]); Dysrhythmia; Emphysema lung ([place]); Gastric ulcer; Gout; Hyperlipidemia; Hypertension; Nephrolithiasis; Prostate cancer ([place]); Shortness of breath dyspnea; and Weight loss.  PSH:    Past Surgical History:  Procedure Laterality Date  . APPENDECTOMY    . BACK SURGERY    . CHOLECYSTECTOMY    . ELECTROPHYSIOLOGIC STUDY N/A 06/17/2015   Procedure: CARDIOVERSION;  Surgeon: Wellington Hampshire, MD;  Location: [facility];  Service: Cardiovascular;  Laterality: N/A;  . ELECTROPHYSIOLOGIC STUDY N/A 03/30/2016   Procedure: CARDIOVERSION;  Surgeon: Minna Merritts, MD;  Location: [facility];  Service: Cardiovascular;  Laterality: N/A;  . GASTRIC BYPASS    . HEMORRHOID SURGERY    . pilonidal cyst    . TEE WITHOUT CARDIOVERSION N/A 06/17/2015   Procedure: TRANSESOPHAGEAL ECHOCARDIOGRAM (TEE);  Surgeon: Wellington Hampshire, MD;  Location: [facility];  Service: Cardiovascular;  Laterality: N/A;    Current Outpatient Prescriptions  Medication Sig Dispense Refill  . albuterol (PROVENTIL HFA;VENTOLIN HFA) 108 (90 Base) MCG/ACT inhaler Inhale 2 puffs into the lungs every 6 (six) hours as needed for wheezing or shortness of breath. 1 Inhaler 6  . allopurinol (ZYLOPRIM) 100 MG tablet Take 100 mg by mouth every other day.    Marland Kitchen amiodarone (PACERONE) 200 MG tablet Take 1 tablet (200 mg total) by mouth daily. 90 tablet 3  . atorvastatin (LIPITOR) 20 MG tablet Take 20 mg by mouth daily.    Marland Kitchen CARTIA XT 120 MG 24 hr capsule TAKE ONE CAPSULE BY MOUTH ONCE DAILY 60 capsule 3  . furosemide (LASIX) 20 MG tablet Take 1 tablet (20 mg total) by mouth  daily as needed. 90 tablet 3  . HYDROcodone-acetaminophen (NORCO/VICODIN) 5-325 MG tablet Take 1 tablet by mouth every 4 (four) hours as needed for moderate pain.    . magnesium oxide (MAG-OX) 400 MG tablet Take 400 mg by mouth daily.     . metFORMIN (GLUCOPHAGE) 500 MG tablet Take by mouth 2 (two) times daily with a meal.    . metoprolol tartrate (LOPRESSOR) 25 MG  tablet Take 12.5 mg by mouth 2 (two) times daily.    . sodium chloride (OCEAN) 0.65 % SOLN nasal spray Place 1 spray into both nostrils as needed for congestion.    . SYMBICORT 160-4.5 MCG/ACT inhaler Inhale 2 puffs into the lungs 2 (two) times daily.     Marland Kitchen warfarin (COUMADIN) 2.5 MG tablet Take 1 tablet (2.5 mg total) by mouth daily. 40 tablet 3   No current facility-administered medications for this visit.      Allergies:   Aspirin and Codeine sulfate   Social History:  The patient  reports that he has quit smoking. He has quit using smokeless tobacco. He reports that he does not drink alcohol or use drugs.   Family History:   family history includes CAD in his mother; Diabetes in his other; Stroke in his mother.    Review of Systems: Review of Systems  Constitutional: Negative.   Respiratory: Negative.   Cardiovascular: Positive for leg swelling.  Gastrointestinal: Negative.   Musculoskeletal: Positive for back pain.  Neurological: Negative.   Psychiatric/Behavioral: Negative.   All other systems reviewed and are negative.    PHYSICAL EXAM: VS:  BP (!) 151/72 (BP Location: Left Arm, Patient Position: Sitting, Cuff Size: Normal)   Pulse (!) 55   Ht 5\' 8"  (1.727 m)   Wt 165 lb (74.8 kg)   BMI 25.09 kg/m  , BMI Body mass index is 25.09 kg/m.  GEN: Well nourished, well developed,Having back pain, worse with any movement Difficulty getting down off the exam table in walking with a cane HEENT: normal  Neck: no JVD, carotid bruits, or masses Cardiac: RRR; 1+ SEM RSB,  no rubs, or gallops, trace pitting ankle edema  Respiratory:  clear to auscultation bilaterally, normal work of breathing GI: soft, nontender, nondistended, + BS MS: no deformity or atrophy  Skin: warm and dry, no rash Neuro:  Strength and sensation are intact Psych: euthymic mood, full affect    Recent Labs: 03/27/2016: BUN 29; Creatinine, Ser 1.44; Hemoglobin 12.2; Platelets 243; Potassium 4.9; Sodium  139    Lipid Panel No results found for: CHOL, HDL, LDLCALC, TRIG    Wt Readings from Last 3 Encounters:  10/26/16 165 lb (74.8 kg)  06/14/16 167 lb (75.8 kg)  04/27/16 169 lb 12 oz (77 kg)       ASSESSMENT AND PLAN:   Coronary artery disease involving native coronary artery of native heart without angina pectoris - Plan: EKG 12-Lead Currently with no symptoms of angina. No further workup at this time. Continue current medication regimen.  Encounter for anticoagulation discussion and counseling Continue warfarin  Type 2 diabetes mellitus with diabetic polyneuropathy, without long-term current use of insulin (HCC) Continued strict diet  SOB (shortness of breath) - Plan: EKG 12-Lead He is taking Lasix 3 times per week,  We recommended he increase Lasix up to Monday, Wednesday, Friday and Saturday 20 mg pill  No significant SOB, some ankle swelling  Paroxysmal atrial fibrillation (HCC) - Plan: EKG 12-Lead Maintaining normal sinus rhythm amiodarone 200 mg daily on  anticoagulation  Back pain Suspect traumatic injury, possible vertebral fracture He does not want to see back surgeon at this time Difficulty ambulating, uses a cane, high fall risk given debilitated state We offered wheelchair to the car    Total encounter time more than 25 minutes  Greater than 50% was spent in counseling and coordination of care with the patient   Disposition:   F/U  6 months   Orders Placed This Encounter  Procedures  . EKG 12-Lead     Signed, Esmond Plants, M.D., Ph.D. 10/26/2016  [place], [place]

## 2016-10-26 ENCOUNTER — Encounter: Payer: Self-pay | Admitting: Cardiovascular Disease

## 2016-10-26 ENCOUNTER — Ambulatory Visit (INDEPENDENT_AMBULATORY_CARE_PROVIDER_SITE_OTHER): Payer: Medicare Other | Admitting: Cardiovascular Disease

## 2016-10-26 VITALS — BP 151/72 | HR 55 | Ht 68.0 in | Wt 165.0 lb

## 2016-10-26 DIAGNOSIS — E1142 Type 2 diabetes mellitus with diabetic polyneuropathy: Secondary | ICD-10-CM

## 2016-10-26 DIAGNOSIS — Z7189 Other specified counseling: Secondary | ICD-10-CM | POA: Diagnosis not present

## 2016-10-26 DIAGNOSIS — I2510 Atherosclerotic heart disease of native coronary artery without angina pectoris: Secondary | ICD-10-CM | POA: Diagnosis not present

## 2016-10-26 DIAGNOSIS — I480 Paroxysmal atrial fibrillation: Secondary | ICD-10-CM | POA: Diagnosis not present

## 2016-10-26 DIAGNOSIS — I5032 Chronic diastolic (congestive) heart failure: Secondary | ICD-10-CM | POA: Diagnosis not present

## 2016-10-26 MED ORDER — METOPROLOL TARTRATE 25 MG PO TABS: 12.5000 mg | ORAL_TABLET | Freq: Two times a day (BID) | ORAL | 3 refills | Status: DC

## 2016-10-26 NOTE — Patient Instructions (Signed)

## 2016-11-07 ENCOUNTER — Ambulatory Visit (INDEPENDENT_AMBULATORY_CARE_PROVIDER_SITE_OTHER): Payer: Medicare Other | Admitting: *Deleted

## 2016-11-07 ENCOUNTER — Other Ambulatory Visit
Admission: RE | Admit: 2016-11-07 | Discharge: 2016-11-07 | Disposition: A | Discharge status: Home / Self Care | Payer: Medicare Other | Source: Ambulatory Visit | Attending: Cardiovascular Disease | Admitting: Cardiovascular Disease

## 2016-11-07 DIAGNOSIS — I481 Persistent atrial fibrillation: Secondary | ICD-10-CM | POA: Diagnosis not present

## 2016-11-07 DIAGNOSIS — I4819 Other persistent atrial fibrillation: Secondary | ICD-10-CM

## 2016-11-07 DIAGNOSIS — Z7189 Other specified counseling: Secondary | ICD-10-CM | POA: Diagnosis not present

## 2016-11-07 DIAGNOSIS — Z7901 Long term (current) use of anticoagulants: Secondary | ICD-10-CM | POA: Diagnosis not present

## 2016-11-07 DIAGNOSIS — I2510 Atherosclerotic heart disease of native coronary artery without angina pectoris: Secondary | ICD-10-CM

## 2016-11-07 LAB — POCT INR: INR: 6.8

## 2016-11-07 LAB — PROTIME-INR
INR: 4.09
Prothrombin Time: 40.7 seconds — ABNORMAL HIGH (ref 11.4–15.2)

## 2016-11-21 ENCOUNTER — Ambulatory Visit (INDEPENDENT_AMBULATORY_CARE_PROVIDER_SITE_OTHER): Payer: Medicare Other

## 2016-11-21 ENCOUNTER — Other Ambulatory Visit: Payer: Self-pay

## 2016-11-21 DIAGNOSIS — I480 Paroxysmal atrial fibrillation: Secondary | ICD-10-CM

## 2016-11-21 DIAGNOSIS — I481 Persistent atrial fibrillation: Secondary | ICD-10-CM | POA: Diagnosis not present

## 2016-11-21 DIAGNOSIS — Z7189 Other specified counseling: Secondary | ICD-10-CM | POA: Diagnosis not present

## 2016-11-21 DIAGNOSIS — I2510 Atherosclerotic heart disease of native coronary artery without angina pectoris: Secondary | ICD-10-CM

## 2016-11-21 DIAGNOSIS — Z7901 Long term (current) use of anticoagulants: Secondary | ICD-10-CM | POA: Diagnosis not present

## 2016-11-21 DIAGNOSIS — I4819 Other persistent atrial fibrillation: Secondary | ICD-10-CM

## 2016-11-21 LAB — PROTIME-INR
INR: 7.3 — AB (ref 0.8–1.2)
PROTHROMBIN TIME: 67.6 s — AB (ref 9.1–12.0)

## 2016-11-21 LAB — POCT INR: INR: >8

## 2016-11-28 ENCOUNTER — Ambulatory Visit (INDEPENDENT_AMBULATORY_CARE_PROVIDER_SITE_OTHER): Payer: Medicare Other

## 2016-11-28 DIAGNOSIS — Z7189 Other specified counseling: Secondary | ICD-10-CM | POA: Diagnosis not present

## 2016-11-28 DIAGNOSIS — I2510 Atherosclerotic heart disease of native coronary artery without angina pectoris: Secondary | ICD-10-CM

## 2016-11-28 DIAGNOSIS — Z7901 Long term (current) use of anticoagulants: Secondary | ICD-10-CM | POA: Diagnosis not present

## 2016-11-28 DIAGNOSIS — I481 Persistent atrial fibrillation: Secondary | ICD-10-CM | POA: Diagnosis not present

## 2016-11-28 DIAGNOSIS — I4819 Other persistent atrial fibrillation: Secondary | ICD-10-CM

## 2016-11-28 LAB — POCT INR: INR: 2.3

## 2016-12-05 ENCOUNTER — Ambulatory Visit (INDEPENDENT_AMBULATORY_CARE_PROVIDER_SITE_OTHER): Payer: Medicare Other

## 2016-12-05 DIAGNOSIS — I4819 Other persistent atrial fibrillation: Secondary | ICD-10-CM

## 2016-12-05 DIAGNOSIS — I481 Persistent atrial fibrillation: Secondary | ICD-10-CM

## 2016-12-05 DIAGNOSIS — I2510 Atherosclerotic heart disease of native coronary artery without angina pectoris: Secondary | ICD-10-CM

## 2016-12-05 DIAGNOSIS — Z7189 Other specified counseling: Secondary | ICD-10-CM | POA: Diagnosis not present

## 2016-12-05 DIAGNOSIS — Z7901 Long term (current) use of anticoagulants: Secondary | ICD-10-CM | POA: Diagnosis not present

## 2016-12-05 LAB — POCT INR: INR: 2.3

## 2016-12-19 ENCOUNTER — Ambulatory Visit (INDEPENDENT_AMBULATORY_CARE_PROVIDER_SITE_OTHER): Payer: Medicare Other

## 2016-12-19 DIAGNOSIS — I4819 Other persistent atrial fibrillation: Secondary | ICD-10-CM

## 2016-12-19 DIAGNOSIS — Z7901 Long term (current) use of anticoagulants: Secondary | ICD-10-CM | POA: Diagnosis not present

## 2016-12-19 DIAGNOSIS — I2510 Atherosclerotic heart disease of native coronary artery without angina pectoris: Secondary | ICD-10-CM

## 2016-12-19 DIAGNOSIS — I481 Persistent atrial fibrillation: Secondary | ICD-10-CM

## 2016-12-19 DIAGNOSIS — Z7189 Other specified counseling: Secondary | ICD-10-CM

## 2016-12-19 LAB — POCT INR: INR: 1.9

## 2017-01-02 ENCOUNTER — Other Ambulatory Visit: Payer: Self-pay | Admitting: Cardiovascular Disease

## 2017-01-07 ENCOUNTER — Ambulatory Visit (INDEPENDENT_AMBULATORY_CARE_PROVIDER_SITE_OTHER): Payer: Medicare Other

## 2017-01-07 DIAGNOSIS — Z7901 Long term (current) use of anticoagulants: Secondary | ICD-10-CM

## 2017-01-07 DIAGNOSIS — Z7189 Other specified counseling: Secondary | ICD-10-CM | POA: Diagnosis not present

## 2017-01-07 DIAGNOSIS — I481 Persistent atrial fibrillation: Secondary | ICD-10-CM | POA: Diagnosis not present

## 2017-01-07 DIAGNOSIS — I4819 Other persistent atrial fibrillation: Secondary | ICD-10-CM

## 2017-01-07 LAB — POCT INR: INR: 2

## 2017-01-07 MED ORDER — WARFARIN SODIUM 2.5 MG PO TABS: 2.5000 mg | ORAL_TABLET | Freq: Every day | ORAL | 3 refills | Status: DC

## 2017-01-28 ENCOUNTER — Ambulatory Visit (INDEPENDENT_AMBULATORY_CARE_PROVIDER_SITE_OTHER): Payer: Medicare Other

## 2017-01-28 DIAGNOSIS — Z7901 Long term (current) use of anticoagulants: Secondary | ICD-10-CM

## 2017-01-28 DIAGNOSIS — I4819 Other persistent atrial fibrillation: Secondary | ICD-10-CM

## 2017-01-28 DIAGNOSIS — Z7189 Other specified counseling: Secondary | ICD-10-CM | POA: Diagnosis not present

## 2017-01-28 DIAGNOSIS — I481 Persistent atrial fibrillation: Secondary | ICD-10-CM

## 2017-01-28 LAB — POCT INR: INR: 2.6

## 2017-03-04 ENCOUNTER — Ambulatory Visit (INDEPENDENT_AMBULATORY_CARE_PROVIDER_SITE_OTHER): Payer: Medicare Other

## 2017-03-04 DIAGNOSIS — I4819 Other persistent atrial fibrillation: Secondary | ICD-10-CM

## 2017-03-04 DIAGNOSIS — Z7189 Other specified counseling: Secondary | ICD-10-CM

## 2017-03-04 DIAGNOSIS — Z7901 Long term (current) use of anticoagulants: Secondary | ICD-10-CM | POA: Diagnosis not present

## 2017-03-04 DIAGNOSIS — I481 Persistent atrial fibrillation: Secondary | ICD-10-CM | POA: Diagnosis not present

## 2017-03-04 LAB — POCT INR: INR: 2.5

## 2017-04-08 ENCOUNTER — Ambulatory Visit (INDEPENDENT_AMBULATORY_CARE_PROVIDER_SITE_OTHER): Payer: Medicare Other

## 2017-04-08 DIAGNOSIS — Z7901 Long term (current) use of anticoagulants: Secondary | ICD-10-CM

## 2017-04-08 DIAGNOSIS — I481 Persistent atrial fibrillation: Secondary | ICD-10-CM

## 2017-04-08 DIAGNOSIS — I4819 Other persistent atrial fibrillation: Secondary | ICD-10-CM

## 2017-04-08 DIAGNOSIS — Z7189 Other specified counseling: Secondary | ICD-10-CM | POA: Diagnosis not present

## 2017-04-08 LAB — POCT INR: INR: 1.7

## 2017-04-08 NOTE — Patient Instructions (Signed)
Please start taking 1 tablet daily except 1 & 1/2 on Mondays, Wednesdays and Fridays Recheck in 2 weeks.

## 2017-04-22 ENCOUNTER — Ambulatory Visit (INDEPENDENT_AMBULATORY_CARE_PROVIDER_SITE_OTHER): Payer: Medicare Other

## 2017-04-22 DIAGNOSIS — Z7189 Other specified counseling: Secondary | ICD-10-CM

## 2017-04-22 DIAGNOSIS — I481 Persistent atrial fibrillation: Secondary | ICD-10-CM | POA: Diagnosis not present

## 2017-04-22 DIAGNOSIS — Z7901 Long term (current) use of anticoagulants: Secondary | ICD-10-CM | POA: Diagnosis not present

## 2017-04-22 DIAGNOSIS — I4819 Other persistent atrial fibrillation: Secondary | ICD-10-CM

## 2017-04-22 LAB — POCT INR: INR: 2.5

## 2017-04-22 NOTE — Patient Instructions (Signed)
Please continue taking 1 tablet daily except 1 & 1/2 on Mondays, Wednesdays and Fridays Recheck in 4 weeks.

## 2017-05-20 ENCOUNTER — Ambulatory Visit (INDEPENDENT_AMBULATORY_CARE_PROVIDER_SITE_OTHER): Payer: Medicare Other

## 2017-05-20 DIAGNOSIS — Z7189 Other specified counseling: Secondary | ICD-10-CM | POA: Diagnosis not present

## 2017-05-20 DIAGNOSIS — I4819 Other persistent atrial fibrillation: Secondary | ICD-10-CM

## 2017-05-20 DIAGNOSIS — Z7901 Long term (current) use of anticoagulants: Secondary | ICD-10-CM | POA: Diagnosis not present

## 2017-05-20 DIAGNOSIS — I481 Persistent atrial fibrillation: Secondary | ICD-10-CM

## 2017-05-20 LAB — POCT INR: INR: 3.1

## 2017-05-20 NOTE — Patient Instructions (Signed)
Please take 1 tablet today, then continue taking 1 tablet daily except 1 & 1/2 on Mondays, Wednesdays and Fridays Recheck in 4 weeks.

## 2017-05-26 NOTE — Progress Notes (Signed)
Cardiology Office Note  Date:  05/29/2017   ID:  Gregory Tyler, DOB July 16, 1940, MRN [id number]  PCP:  [name], MD   Chief Complaint  Patient presents with  . other    6 month follow up. Meds reviewed by the pt. verbally. Pt. c/o shortness of breath, abdominal pain and LE edema. Pt. c/o not getting any sleep due to taking care of his wife who has Parkinson's disease.     HPI:  [name] is a pleasant 77 year old gentleman With history of  paroxysmal atrial fibrillation On warfarin,  diabetes,  hospital 04/25/2015 with shortness of breath,  Found to have atrial fibrillation with RVR, severe hypertension,  Placed on BiPAP for respiratory distress.  cardioversion 06/17/2015 which was successful He presents for routine follow-up of his paroxysmal atrial fibrillation , cardioversion 03/30/2016  In follow-up today he has significant stressors at home Poor sleep, "I have not slept in days" Wife won't sleep, confused, dementia Dr. Doy Hutching is in her main doctor Wife having falls, seen in the Er numerous times, wife not taking meds, up all night Has a visiting nurse once a week Reports he is at his wits end  Having some SOB, does not feel it is a problem, feels stable Main issue is the home stress taking care of his wife, unable to leave her alone for any length of time Stress is pushing his blood pressure  Skips Lasix dosing here and there  "Cannot hold his water" Seen by Dr. Jacqlyn Larsen Currently takes his half dose Lasix daily, higher dose causes prerenal state Denies any chest pain symptoms, no lightheadedness or dizziness  EKG personally reviewed by myself on todays visit Shows NSR rate 62 bpm, intraventricular conduction delay  Other past medical history reviewed  elevated BUN/creatinine with overdiuresis 03/27/2016, Lasix dose was decreased. Was previously taking Lasix daily  Atrial fib in 12/16/2015 in CHF clinic per the exam note , rate >100  back in April  2017 was sinus rhythm   in January 2017, blood pressure was 80 systolic, Lasix was held Symptoms of shortness of breath perhaps worse after he stopped his Symbicort 2 weeks ago. Does not have the money to refill   Previous echocardiogram June or 2017 with ejection fraction 35-40% while in atrial fibrillation   PMH:   has a past medical history of Anemia, Anxiety, Arthritis, CHF (congestive heart failure) ([place]), Chronic atrial fibrillation ([place]), Coronary artery disease, Depression, Diabetes mellitus without complication ([place]), Dysrhythmia, Emphysema lung ([place]), Gastric ulcer, Gout, Hyperlipidemia, Hypertension, Nephrolithiasis, Prostate cancer ([place]), Shortness of breath dyspnea, and Weight loss.  PSH:    Past Surgical History:  Procedure Laterality Date  . APPENDECTOMY    . BACK SURGERY    . CHOLECYSTECTOMY    . ELECTROPHYSIOLOGIC STUDY N/A 06/17/2015   Procedure: CARDIOVERSION;  Surgeon: Wellington Hampshire, MD;  Location: [facility];  Service: Cardiovascular;  Laterality: N/A;  . ELECTROPHYSIOLOGIC STUDY N/A 03/30/2016   Procedure: CARDIOVERSION;  Surgeon: Minna Merritts, MD;  Location: [facility] OR[facility];  Service: Cardiovascular;  Laterality: N/A;  . GASTRIC BYPASS    . HEMORRHOID SURGERY    . pilonidal cyst    . TEE WITHOUT CARDIOVERSION N/A 06/17/2015   Procedure: TRANSESOPHAGEAL ECHOCARDIOGRAM (TEE);  Surgeon: Wellington Hampshire, MD;  Location: [facility] OR[facility];  Service: Cardiovascular;  Laterality: N/A;    Current Outpatient Medications  Medication Sig Dispense Refill  . albuterol (PROVENTIL HFA;VENTOLIN HFA) 108 (90 Base) MCG/ACT inhaler Inhale 2 puffs into the lungs every  6 (six) hours as needed for wheezing or shortness of breath. 1 Inhaler 6  . allopurinol (ZYLOPRIM) 100 MG tablet Take 100 mg by mouth every other day.    Marland Kitchen amiodarone (PACERONE) 200 MG tablet Take 1 tablet (200 mg total) by mouth daily. 90 tablet 3  . atorvastatin (LIPITOR) 20 MG tablet Take 20 mg by mouth daily.    Marland Kitchen CARTIA  XT 120 MG 24 hr capsule TAKE ONE CAPSULE BY MOUTH ONCE DAILY 180 capsule 3  . furosemide (LASIX) 20 MG tablet Take 1 tablet (20 mg total) by mouth daily as needed. 90 tablet 3  . HYDROcodone-acetaminophen (NORCO/VICODIN) 5-325 MG tablet Take 1 tablet by mouth every 4 (four) hours as needed for moderate pain.    . magnesium oxide (MAG-OX) 400 MG tablet Take 400 mg by mouth daily.     . metFORMIN (GLUCOPHAGE) 500 MG tablet Take by mouth 2 (two) times daily with a meal.    . metoprolol tartrate (LOPRESSOR) 25 MG tablet Take 0.5 tablets (12.5 mg total) by mouth 2 (two) times daily. 90 tablet 3  . sodium chloride (OCEAN) 0.65 % SOLN nasal spray Place 1 spray into both nostrils as needed for congestion.    . SYMBICORT 160-4.5 MCG/ACT inhaler Inhale 2 puffs into the lungs 2 (two) times daily.     Marland Kitchen warfarin (COUMADIN) 2.5 MG tablet Take 1 tablet (2.5 mg total) by mouth daily. 40 tablet 3   No current facility-administered medications for this visit.      Allergies:   Aspirin and Codeine sulfate   Social History:  The patient  reports that he has quit smoking. He has quit using smokeless tobacco. He reports that he does not drink alcohol or use drugs.   Family History:   family history includes CAD in his mother; Diabetes in his other; Stroke in his mother.    Review of Systems: Review of Systems  Constitutional: Negative.   Respiratory: Negative.   Cardiovascular: Positive for leg swelling.  Gastrointestinal: Negative.   Musculoskeletal: Positive for joint pain.  Neurological: Negative.   Psychiatric/Behavioral: Negative.   All other systems reviewed and are negative.    PHYSICAL EXAM: VS:  BP (!) 158/68 (BP Location: Left Arm, Patient Position: Sitting, Cuff Size: Normal)   Pulse 62   Ht 5\' 8"  (1.727 m)   Wt 166 lb 8 oz (75.5 kg)   BMI 25.32 kg/m  , BMI Body mass index is 25.32 kg/m.  Constitutional:  oriented to person, place, and time. No distress.  HENT:  Head: Normocephalic  and atraumatic.  Eyes:  no discharge. No scleral icterus.  Neck: Normal range of motion. Neck supple. No JVD present.  Cardiovascular:RRR; 1+ SEM RSB,  no rubs, or gallops, 1+ pitting LE edema  Pulmonary/Chest: Effort normal and breath sounds normal. No stridor. No respiratory distress.  no wheezes.  no rales.  no tenderness.  Abdominal: Soft.  no distension.  no tenderness.  Musculoskeletal: Normal range of motion.  no  tenderness or deformity.  Neurological:  normal muscle tone. Coordination normal. No atrophy Skin: Skin is warm and dry. No rash noted. not diaphoretic.  Psychiatric:  normal mood and affect. behavior is normal. Thought content normal.   Recent Labs: No results found for requested labs within last 8760 hours.    Lipid Panel No results found for: CHOL, HDL, LDLCALC, TRIG    Wt Readings from Last 3 Encounters:  05/29/17 166 lb 8 oz (75.5 kg)  10/26/16 165  lb (74.8 kg)  06/14/16 167 lb (75.8 kg)       ASSESSMENT AND PLAN:   Coronary artery disease involving native coronary artery of native heart without angina pectoris - Plan: EKG 12-Lead Currently with no symptoms of angina. No further workup at this time. Continue current medication regimen.  Stable  Encounter for anticoagulation discussion and counseling Continue warfarin, no recent falls  Type 2 diabetes mellitus with diabetic polyneuropathy, without long-term current use of insulin (HCC) Continue strict diet, weight stable  SOB (shortness of breath) - Plan: EKG 12-Lead Leg swelling is worse, he is not taking his Lasix half dose on a regular basis Reports his wife lost his medication Lasix refilled, recommended he take half pill daily until leg edema improves We will try to avoid overdiuresis Some of the leg swelling could be secondary to calcium channel blocker  Paroxysmal atrial fibrillation ([place]) - Plan: EKG 12-Lead Maintaining normal sinus rhythm amiodarone 200 mg daily on anticoagulation  Back  pain Improved Now with bilateral shoulder discomfort   Total encounter time more than 25 minutes  Greater than 50% was spent in counseling and coordination of care with the patient   Disposition:   F/U  6 months   Orders Placed This Encounter  Procedures  . EKG 12-Lead     Signed, Esmond Plants, M.D., Ph.D. 05/29/2017  [place], [place]

## 2017-05-29 ENCOUNTER — Encounter: Payer: Self-pay | Admitting: Cardiovascular Disease

## 2017-05-29 ENCOUNTER — Ambulatory Visit (INDEPENDENT_AMBULATORY_CARE_PROVIDER_SITE_OTHER): Payer: Medicare Other | Admitting: Cardiovascular Disease

## 2017-05-29 VITALS — BP 158/68 | HR 62 | Ht 68.0 in | Wt 166.5 lb

## 2017-05-29 DIAGNOSIS — E1142 Type 2 diabetes mellitus with diabetic polyneuropathy: Secondary | ICD-10-CM

## 2017-05-29 DIAGNOSIS — I481 Persistent atrial fibrillation: Secondary | ICD-10-CM

## 2017-05-29 DIAGNOSIS — I5032 Chronic diastolic (congestive) heart failure: Secondary | ICD-10-CM | POA: Diagnosis not present

## 2017-05-29 DIAGNOSIS — I5022 Chronic systolic (congestive) heart failure: Secondary | ICD-10-CM | POA: Diagnosis not present

## 2017-05-29 DIAGNOSIS — I10 Essential (primary) hypertension: Secondary | ICD-10-CM

## 2017-05-29 DIAGNOSIS — I4819 Other persistent atrial fibrillation: Secondary | ICD-10-CM

## 2017-05-29 MED ORDER — FUROSEMIDE 20 MG PO TABS: 20.0000 mg | ORAL_TABLET | Freq: Every day | ORAL | 3 refills | Status: DC | PRN

## 2017-05-29 NOTE — Patient Instructions (Addendum)
Medication Instructions:   No medication changes made  Continue to take lasix as needed for leg swelling  Labwork:  No new labs needed  Testing/Procedures:  No further testing at this time   Follow-Up: It was a pleasure seeing you in the office today. Please call us if you have new issues that need to be addressed before your next appt.  [phone]  Your physician wants you to follow-up in: 6 months.  You will receive a reminder letter in the mail two months in advance. If you don't receive a letter, please call our office to schedule the follow-up appointment.  If you need a refill on your cardiac medications before your next appointment, please call your pharmacy.

## 2017-06-17 ENCOUNTER — Ambulatory Visit (INDEPENDENT_AMBULATORY_CARE_PROVIDER_SITE_OTHER): Payer: Medicare Other

## 2017-06-17 DIAGNOSIS — I482 Chronic atrial fibrillation, unspecified: Secondary | ICD-10-CM

## 2017-06-17 DIAGNOSIS — Z5181 Encounter for therapeutic drug level monitoring: Secondary | ICD-10-CM | POA: Diagnosis not present

## 2017-06-17 DIAGNOSIS — I481 Persistent atrial fibrillation: Secondary | ICD-10-CM | POA: Diagnosis not present

## 2017-06-17 DIAGNOSIS — Z7189 Other specified counseling: Secondary | ICD-10-CM | POA: Diagnosis not present

## 2017-06-17 DIAGNOSIS — I4819 Other persistent atrial fibrillation: Secondary | ICD-10-CM

## 2017-06-17 DIAGNOSIS — Z7901 Long term (current) use of anticoagulants: Secondary | ICD-10-CM | POA: Diagnosis not present

## 2017-06-17 LAB — POCT INR: INR: 3

## 2017-06-17 NOTE — Patient Instructions (Signed)
Please continue taking 1 tablet daily except 1 & 1/2 on Mondays, Wednesdays and Fridays Recheck in 4 weeks.

## 2017-06-23 ENCOUNTER — Other Ambulatory Visit: Payer: Self-pay | Admitting: Cardiovascular Disease

## 2017-06-24 NOTE — Telephone Encounter (Signed)
Refill Request.  

## 2017-07-15 ENCOUNTER — Ambulatory Visit (INDEPENDENT_AMBULATORY_CARE_PROVIDER_SITE_OTHER): Payer: Medicare Other

## 2017-07-15 DIAGNOSIS — I481 Persistent atrial fibrillation: Secondary | ICD-10-CM

## 2017-07-15 DIAGNOSIS — I4819 Other persistent atrial fibrillation: Secondary | ICD-10-CM

## 2017-07-15 DIAGNOSIS — Z7901 Long term (current) use of anticoagulants: Secondary | ICD-10-CM

## 2017-07-15 DIAGNOSIS — Z7189 Other specified counseling: Secondary | ICD-10-CM | POA: Diagnosis not present

## 2017-07-15 LAB — POCT INR: INR: 3.7

## 2017-07-15 NOTE — Patient Instructions (Signed)
Please take 1/2 tablet tonight, then continue taking 1 tablet daily except 1 & 1/2 on Mondays, Wednesdays and Fridays Recheck in 3 weeks.

## 2017-08-05 ENCOUNTER — Ambulatory Visit (INDEPENDENT_AMBULATORY_CARE_PROVIDER_SITE_OTHER): Payer: Medicare Other

## 2017-08-05 DIAGNOSIS — Z7901 Long term (current) use of anticoagulants: Secondary | ICD-10-CM | POA: Diagnosis not present

## 2017-08-05 DIAGNOSIS — Z7189 Other specified counseling: Secondary | ICD-10-CM | POA: Diagnosis not present

## 2017-08-05 DIAGNOSIS — I481 Persistent atrial fibrillation: Secondary | ICD-10-CM

## 2017-08-05 DIAGNOSIS — I4819 Other persistent atrial fibrillation: Secondary | ICD-10-CM

## 2017-08-05 LAB — POCT INR: INR: 3.1

## 2017-08-05 MED ORDER — METOPROLOL TARTRATE 25 MG PO TABS: 50.0000 mg | ORAL_TABLET | Freq: Two times a day (BID) | ORAL | 3 refills | Status: DC

## 2017-08-05 NOTE — Patient Instructions (Addendum)
Please take 1 tablet tonight, then continue taking 1 tablet daily except 1 & 1/2 on Mondays, Wednesdays and Fridays Recheck in 3 weeks.     Per Dr. Rockey Situ for your swelling:  Please take your Lasix (furosdemide - your water pill) as directed. Please STOP Cartia (diltiazem) Please INCREASE your metoprolol to 50 mg twice daily   Monitor your BP and bring record to your appt w/ Dr. Rockey Situ in 1 month on 09/02/17 @ 10:40

## 2017-08-26 ENCOUNTER — Other Ambulatory Visit: Payer: Self-pay | Admitting: Cardiovascular Disease

## 2017-08-26 NOTE — Telephone Encounter (Signed)
Patient is requesting a refill on Metoprolol tart 25 mg (1/2) tablet twice a day.  The patient's instructions from Dr. Donivan Scull last office notes reads Metoprolol Tart 25 mg take two tablet twice a day.  Please review chart and send in correct instructions for Metoprolol Tart.

## 2017-08-27 NOTE — Telephone Encounter (Signed)
Notified patient regarding Metoprolol Tartrate dosing.  The patient stated, he is taking the Metoprolol Tart 25 mg 2 tablets (50mg )  twice a day per Dr. Donivan Scull instructions.

## 2017-08-27 NOTE — Telephone Encounter (Signed)
Patient last saw Dr. Rockey Situ on 05/29/17 and was taking metoprolol tartrate 25 mg- 1/2 tablet (12.5 mg) BID.  Per [facility] clinic note on 08/05/17:  "Please take 1 tablet tonight, then continue taking 1 tablet daily except 1 & 1/2 on Mondays, Wednesdays and Fridays Recheck in 3 weeks.     Per Dr. Rockey Situ for your swelling:  Please take your Lasix (furosdemide - your water pill) as directed. Please STOP Cartia (diltiazem) Please INCREASE your metoprolol to 50 mg twice daily   Monitor your BP and bring record to your appt w/ Dr. Rockey Situ in 1 month on 09/02/17 @ 10:40"  Can you please call the patient to confirm if he is actually taking metoprolol tartrate 25 mg- 2 tablets (50 mg) BID? If so, ok to refill at that dose.

## 2017-09-01 NOTE — Progress Notes (Signed)
Cardiology Office Note  Date:  09/02/2017   ID:  Gregory Tyler, DOB May 01, 1940, MRN [id number]  PCP:  [name], MD   Chief Complaint  Patient presents with  . other    Pt. c/o bilateral leg edema, weakness and unable to walk at times. Meds reviewed by the pt. verbally.     HPI:  Gregory Tyler is a pleasant 77 year old gentleman With history of  paroxysmal atrial fibrillation On warfarin,  diabetes,  hospital 04/25/2015 with shortness of breath,  Found to have atrial fibrillation with RVR, severe hypertension,  Placed on BiPAP for respiratory distress.  cardioversion 06/17/2015 which was successful Gregory Tyler presents for routine follow-up of his paroxysmal atrial fibrillation , cardioversion 03/30/2016  Unable to stand today Has not been taking his Lasix on a regular basis as on previous visits Gregory Tyler is blaming his wife and her doctor visits for not taking his Lasix She has dementia, confused, want sleep Reports Gregory Tyler has not slept in days as on his previous office visit Son is helping but Gregory Tyler is not here today  Gregory Tyler is having scrotal swelling, pitting edema "Have plenty of Lasix", Gregory Tyler is just not taking a regular basis On previous visits reports Gregory Tyler is bothered by frequent urination Could not hold his water and was seen Dr. Jacqlyn Larsen Often was cutting the Lasix in half, and skipping doses  EKG personally reviewed by myself on todays visit Shows NSR rate 54 bpm, intraventricular conduction delay  Other past medical history reviewed  elevated BUN/creatinine with overdiuresis 03/27/2016, Lasix dose was decreased. Was previously taking Lasix daily  Atrial fib in 12/16/2015 in CHF clinic per the exam note , rate >100  back in April 2017 was sinus rhythm   in January 2017, blood pressure was 80 systolic, Lasix was held Symptoms of shortness of breath perhaps worse after Gregory Tyler stopped his Symbicort 2 weeks ago. Does not have the money to refill   Previous echocardiogram June or  2017 with ejection fraction 35-40% while in atrial fibrillation   PMH:   has a past medical history of Anemia, Anxiety, Arthritis, CHF (congestive heart failure) ([place]), Chronic atrial fibrillation ([place]), Coronary artery disease, Depression, Diabetes mellitus without complication ([place]), Dysrhythmia, Emphysema lung ([place]), Gastric ulcer, Gout, Hyperlipidemia, Hypertension, Nephrolithiasis, Prostate cancer ([place]), Shortness of breath dyspnea, and Weight loss.  PSH:    Past Surgical History:  Procedure Laterality Date  . APPENDECTOMY    . BACK SURGERY    . CHOLECYSTECTOMY    . ELECTROPHYSIOLOGIC STUDY N/A 06/17/2015   Procedure: CARDIOVERSION;  Surgeon: Wellington Hampshire, MD;  Location: [facility];  Service: Cardiovascular;  Laterality: N/A;  . ELECTROPHYSIOLOGIC STUDY N/A 03/30/2016   Procedure: CARDIOVERSION;  Surgeon: Minna Merritts, MD;  Location: [facility];  Service: Cardiovascular;  Laterality: N/A;  . GASTRIC BYPASS    . HEMORRHOID SURGERY    . pilonidal cyst    . TEE WITHOUT CARDIOVERSION N/A 06/17/2015   Procedure: TRANSESOPHAGEAL ECHOCARDIOGRAM (TEE);  Surgeon: Wellington Hampshire, MD;  Location: [facility];  Service: Cardiovascular;  Laterality: N/A;    Current Outpatient Medications  Medication Sig Dispense Refill  . albuterol (PROVENTIL HFA;VENTOLIN HFA) 108 (90 Base) MCG/ACT inhaler Inhale 2 puffs into the lungs every 6 (six) hours as needed for wheezing or shortness of breath. 1 Inhaler 6  . amiodarone (PACERONE) 200 MG tablet Take 1 tablet (200 mg total) by mouth daily. 90 tablet 3  . atorvastatin (LIPITOR) 20 MG tablet Take 20 mg by mouth  daily.    . furosemide (LASIX) 20 MG tablet Take 1 tablet (20 mg total) by mouth daily as needed. 90 tablet 3  . HYDROcodone-acetaminophen (NORCO/VICODIN) 5-325 MG tablet Take 1 tablet by mouth every 4 (four) hours as needed for moderate pain.    . metFORMIN (GLUCOPHAGE) 500 MG tablet Take by mouth 2 (two) times daily with a meal.    . metoprolol  tartrate (LOPRESSOR) 25 MG tablet Take 2 tablets (50 mg total) by mouth 2 (two) times daily. 90 tablet 3  . sodium chloride (OCEAN) 0.65 % SOLN nasal spray Place 1 spray into both nostrils as needed for congestion.    . SYMBICORT 160-4.5 MCG/ACT inhaler Inhale 2 puffs into the lungs 2 (two) times daily.     Marland Kitchen warfarin (COUMADIN) 2.5 MG tablet TAKE 1 TABLET (2.5 MG TOTAL) BY MOUTH DAILY 40 tablet 3   No current facility-administered medications for this visit.      Allergies:   Aspirin; Codeine; and Codeine sulfate   Social History:  The patient  reports that Gregory Tyler has quit smoking. Gregory Tyler has quit using smokeless tobacco. Gregory Tyler reports that Gregory Tyler does not drink alcohol or use drugs.   Family History:   family history includes CAD in his mother; Diabetes in his other; Stroke in his mother.    Review of Systems: Review of Systems  Constitutional: Negative.   Respiratory: Negative.   Cardiovascular: Positive for leg swelling.  Gastrointestinal: Negative.   Genitourinary:        scrotal swelling  Musculoskeletal: Positive for joint pain.  Neurological: Negative.   Psychiatric/Behavioral: Negative.   All other systems reviewed and are negative.    PHYSICAL EXAM: VS:  BP 130/60 (BP Location: Left Arm, Patient Position: Sitting, Cuff Size: Normal)   Pulse (!) 54   Ht 5\' 8"  (1.727 m)   BMI 25.32 kg/m  , BMI Body mass index is 25.32 kg/m.  Constitutional:  oriented to person, place, and time. No distress. presenting in a wheelchair HENT:  Head: Normocephalic and atraumatic.  Eyes:  no discharge. No scleral icterus.  Neck: Normal range of motion. Neck supple. No JVD present.  Cardiovascular:RRR; 1+ SEM RSB,  no rubs, or gallops, 2+ pitting LE edema , extending up to his scrotum Pulmonary/Chest: Effort normal and breath sounds normal. No stridor. No respiratory distress.  no wheezes.  no rales.  no tenderness.  Abdominal: Soft.  no distension.  no tenderness.  Musculoskeletal: Normal range of  motion.  no  tenderness or deformity.  Neurological:  normal muscle tone. Coordination normal. No atrophy Skin: Skin is warm and dry. No rash noted. not diaphoretic.  Psychiatric:  normal mood and affect. behavior is normal. Thought content normal.   Recent Labs: No results found for requested labs within last 8760 hours.    Lipid Panel No results found for: CHOL, HDL, LDLCALC, TRIG    Wt Readings from Last 3 Encounters:  05/29/17 166 lb 8 oz (75.5 kg)  10/26/16 165 lb (74.8 kg)  06/14/16 167 lb (75.8 kg)       ASSESSMENT AND PLAN:  Lower extremity edema Recommended Gregory Tyler be compliant with his Lasix Previously only taking half dose sometimes bothered by incontinence and urination issues Given the magnitude of his swelling including scrotal  Swelling, and his discomfort we will increase Lasix up to 40 twice a day and strongly recommended Gregory Tyler take the pills  I will see him in one week to make sure symptoms are improving  Chronic  diastolic And systolic CHF Previous echocardiogram January 2017 with low ejection fraction but I was in the setting of atrial fibrillation Worsening lower extremity edema and scrotal swelling.  Noncompliant with his Lasix Stressed importance of taking his diuretic and minimizing his fluid intake  Coronary artery disease involving native coronary artery of native heart without angina pectoris - Plan: EKG 12-Lead Currently with no symptoms of angina. No further workup at this time. Continue current medication regimen.  Stable  Encounter for anticoagulation discussion and counseling iNR greater than 8 Stat INR has been drawn today We'll hold Coumadin until numbers return Possibly exacerbated in the setting of congestive heart failure, unable to scoot change in his diet  Type 2 diabetes mellitus with diabetic polyneuropathy, without long-term current use of insulin (HCC) Continue recommended low salt diet, low, hydrates  SOB (shortness of breath) - Plan:  EKG 12-Lead Worsening leg swelling from noncompliance with his Lasix lan as above  Paroxysmal atrial fibrillation (HCC) - Plan: EKG 12-Lead Maintaining normal sinus rhythm amiodarone 200 mg daily on anticoagulation stable  Back pain Improved Now with bilateral shoulder discomfort   Total encounter time more than 45 minutes  Greater than 50% was spent in counseling and coordination of care with the patient   Disposition:   F/U  1 months  I will see him next week when Gregory Tyler comes in for Coumadin check   Orders Placed This Encounter  Procedures  . INR/PT  . EKG 12-Lead     Signed, Esmond Plants, M.D., Ph.D. 09/02/2017  [place], [place]

## 2017-09-02 ENCOUNTER — Ambulatory Visit (INDEPENDENT_AMBULATORY_CARE_PROVIDER_SITE_OTHER): Payer: Medicare Other

## 2017-09-02 ENCOUNTER — Encounter: Payer: Self-pay | Admitting: Cardiovascular Disease

## 2017-09-02 ENCOUNTER — Ambulatory Visit (INDEPENDENT_AMBULATORY_CARE_PROVIDER_SITE_OTHER): Payer: Medicare Other | Admitting: Cardiovascular Disease

## 2017-09-02 VITALS — BP 130/60 | HR 54 | Ht 68.0 in

## 2017-09-02 DIAGNOSIS — Z7901 Long term (current) use of anticoagulants: Secondary | ICD-10-CM | POA: Diagnosis not present

## 2017-09-02 DIAGNOSIS — R791 Abnormal coagulation profile: Secondary | ICD-10-CM

## 2017-09-02 DIAGNOSIS — I5043 Acute on chronic combined systolic (congestive) and diastolic (congestive) heart failure: Secondary | ICD-10-CM

## 2017-09-02 DIAGNOSIS — I480 Paroxysmal atrial fibrillation: Secondary | ICD-10-CM

## 2017-09-02 DIAGNOSIS — E1142 Type 2 diabetes mellitus with diabetic polyneuropathy: Secondary | ICD-10-CM | POA: Diagnosis not present

## 2017-09-02 DIAGNOSIS — I420 Dilated cardiomyopathy: Secondary | ICD-10-CM | POA: Diagnosis not present

## 2017-09-02 DIAGNOSIS — I4819 Other persistent atrial fibrillation: Secondary | ICD-10-CM

## 2017-09-02 DIAGNOSIS — Z7189 Other specified counseling: Secondary | ICD-10-CM

## 2017-09-02 DIAGNOSIS — I481 Persistent atrial fibrillation: Secondary | ICD-10-CM | POA: Diagnosis not present

## 2017-09-02 LAB — PROTIME-INR

## 2017-09-02 LAB — POCT INR: INR: >8

## 2017-09-02 NOTE — Patient Instructions (Signed)
STAT INR TODAY. DO NOT TAKE ANY COUMADIN UNTIL I CALL YOU WITH FURTHER INSTRUCTIONS.

## 2017-09-02 NOTE — Progress Notes (Signed)
Received call from Herbert Deaner at [facility].  She reports that their lab received blood sample from our office, but the anticoag tube was not filled up all the way to the line and they could not process the sample. Called pt and advised him to hold coumadin tonight and tomorrow, have a large serving of greens tonight and come back to the office on Wednesday for repeat INR check here.  He reports difficulty in getting over here due to his wife having dementia and him having limited resources to help care for her while he is gone.  Advised him to come over anytime on Wednesday and I will work him in for INR check.  He verbalizes understanding, is agreeable and will call back if he will be unable to get here.

## 2017-09-02 NOTE — Patient Instructions (Addendum)
Medication Instructions:   This week Please take lasix 2 pills (40 mg) in the morning and  2 pills (40 mg) after lunch  Next week take lasix 1 pill (20 mg) Am and 1 pill (20 mg) after lunch   Labwork:  STAT PT/ INR lab draw today- hold your coumadin (warfarin) until you hear back from our office  Testing/Procedures:  No further testing at this time   Follow-Up: It was a pleasure seeing you in the office today. Please call us if you have new issues that need to be addressed before your next appt.  [phone]  Your physician wants you to follow-up in: 2 to 3 weeks (ok to use a 7 day hold)   If you need a refill on your cardiac medications before your next appointment, please call your pharmacy.  For educational health videos Log in to : [email or web address].com Or : [email or web address], password : [email or web address]

## 2017-09-04 ENCOUNTER — Ambulatory Visit (INDEPENDENT_AMBULATORY_CARE_PROVIDER_SITE_OTHER): Payer: Medicare Other

## 2017-09-04 DIAGNOSIS — Z7901 Long term (current) use of anticoagulants: Secondary | ICD-10-CM

## 2017-09-04 DIAGNOSIS — I481 Persistent atrial fibrillation: Secondary | ICD-10-CM

## 2017-09-04 DIAGNOSIS — I4819 Other persistent atrial fibrillation: Secondary | ICD-10-CM

## 2017-09-04 DIAGNOSIS — Z7189 Other specified counseling: Secondary | ICD-10-CM

## 2017-09-04 LAB — POCT INR: INR: 6.2

## 2017-09-04 NOTE — Patient Instructions (Signed)
Please hold coumadin through Saturday.  On Sunday, resume previous dosage of 1 tablet every day except 1.5 tablets on Mondays, Wednesdays and Fridays.  Recheck in 1 week.

## 2017-09-11 ENCOUNTER — Encounter: Payer: Self-pay | Admitting: Emergency Medicine

## 2017-09-11 ENCOUNTER — Ambulatory Visit (INDEPENDENT_AMBULATORY_CARE_PROVIDER_SITE_OTHER): Payer: Medicare Other

## 2017-09-11 ENCOUNTER — Inpatient Hospital Stay
Admission: EM | Admit: 2017-09-11 | Discharge: 2017-09-15 | DRG: 291 | Disposition: A | Discharge status: Home Health | Payer: Medicare Other | Attending: Internal Medicine | Admitting: Internal Medicine

## 2017-09-11 ENCOUNTER — Other Ambulatory Visit: Payer: Self-pay

## 2017-09-11 ENCOUNTER — Emergency Department: Payer: Medicare Other

## 2017-09-11 DIAGNOSIS — I480 Paroxysmal atrial fibrillation: Secondary | ICD-10-CM | POA: Diagnosis not present

## 2017-09-11 DIAGNOSIS — I447 Left bundle-branch block, unspecified: Secondary | ICD-10-CM | POA: Diagnosis present

## 2017-09-11 DIAGNOSIS — R202 Paresthesia of skin: Secondary | ICD-10-CM | POA: Diagnosis not present

## 2017-09-11 DIAGNOSIS — I4581 Long QT syndrome: Secondary | ICD-10-CM | POA: Diagnosis present

## 2017-09-11 DIAGNOSIS — T501X6A Underdosing of loop [high-ceiling] diuretics, initial encounter: Secondary | ICD-10-CM | POA: Diagnosis present

## 2017-09-11 DIAGNOSIS — I429 Cardiomyopathy, unspecified: Secondary | ICD-10-CM | POA: Diagnosis present

## 2017-09-11 DIAGNOSIS — D631 Anemia in chronic kidney disease: Secondary | ICD-10-CM | POA: Diagnosis present

## 2017-09-11 DIAGNOSIS — J439 Emphysema, unspecified: Secondary | ICD-10-CM | POA: Diagnosis present

## 2017-09-11 DIAGNOSIS — G8929 Other chronic pain: Secondary | ICD-10-CM | POA: Diagnosis present

## 2017-09-11 DIAGNOSIS — N183 Chronic kidney disease, stage 3 (moderate): Secondary | ICD-10-CM | POA: Diagnosis present

## 2017-09-11 DIAGNOSIS — Z885 Allergy status to narcotic agent status: Secondary | ICD-10-CM

## 2017-09-11 DIAGNOSIS — Z7984 Long term (current) use of oral hypoglycemic drugs: Secondary | ICD-10-CM

## 2017-09-11 DIAGNOSIS — Z886 Allergy status to analgesic agent status: Secondary | ICD-10-CM

## 2017-09-11 DIAGNOSIS — I482 Chronic atrial fibrillation: Secondary | ICD-10-CM | POA: Diagnosis present

## 2017-09-11 DIAGNOSIS — Z8711 Personal history of peptic ulcer disease: Secondary | ICD-10-CM

## 2017-09-11 DIAGNOSIS — Z87891 Personal history of nicotine dependence: Secondary | ICD-10-CM

## 2017-09-11 DIAGNOSIS — R102 Pelvic and perineal pain: Secondary | ICD-10-CM | POA: Diagnosis present

## 2017-09-11 DIAGNOSIS — M1990 Unspecified osteoarthritis, unspecified site: Secondary | ICD-10-CM | POA: Diagnosis present

## 2017-09-11 DIAGNOSIS — I509 Heart failure, unspecified: Secondary | ICD-10-CM

## 2017-09-11 DIAGNOSIS — Z6823 Body mass index (BMI) 23.0-23.9, adult: Secondary | ICD-10-CM

## 2017-09-11 DIAGNOSIS — Z5181 Encounter for therapeutic drug level monitoring: Secondary | ICD-10-CM

## 2017-09-11 DIAGNOSIS — Z7901 Long term (current) use of anticoagulants: Secondary | ICD-10-CM | POA: Diagnosis not present

## 2017-09-11 DIAGNOSIS — N5089 Other specified disorders of the male genital organs: Secondary | ICD-10-CM | POA: Diagnosis not present

## 2017-09-11 DIAGNOSIS — I2510 Atherosclerotic heart disease of native coronary artery without angina pectoris: Secondary | ICD-10-CM | POA: Diagnosis present

## 2017-09-11 DIAGNOSIS — Z9884 Bariatric surgery status: Secondary | ICD-10-CM

## 2017-09-11 DIAGNOSIS — Z7951 Long term (current) use of inhaled steroids: Secondary | ICD-10-CM

## 2017-09-11 DIAGNOSIS — R531 Weakness: Secondary | ICD-10-CM | POA: Diagnosis present

## 2017-09-11 DIAGNOSIS — R001 Bradycardia, unspecified: Secondary | ICD-10-CM | POA: Diagnosis present

## 2017-09-11 DIAGNOSIS — I340 Nonrheumatic mitral (valve) insufficiency: Secondary | ICD-10-CM | POA: Diagnosis not present

## 2017-09-11 DIAGNOSIS — Z7189 Other specified counseling: Secondary | ICD-10-CM | POA: Diagnosis not present

## 2017-09-11 DIAGNOSIS — I5033 Acute on chronic diastolic (congestive) heart failure: Secondary | ICD-10-CM | POA: Diagnosis present

## 2017-09-11 DIAGNOSIS — E440 Moderate protein-calorie malnutrition: Secondary | ICD-10-CM | POA: Diagnosis present

## 2017-09-11 DIAGNOSIS — Z79899 Other long term (current) drug therapy: Secondary | ICD-10-CM

## 2017-09-11 DIAGNOSIS — E8779 Other fluid overload: Secondary | ICD-10-CM

## 2017-09-11 DIAGNOSIS — I420 Dilated cardiomyopathy: Secondary | ICD-10-CM | POA: Diagnosis not present

## 2017-09-11 DIAGNOSIS — I5043 Acute on chronic combined systolic (congestive) and diastolic (congestive) heart failure: Secondary | ICD-10-CM | POA: Diagnosis not present

## 2017-09-11 DIAGNOSIS — R1031 Right lower quadrant pain: Secondary | ICD-10-CM | POA: Diagnosis not present

## 2017-09-11 DIAGNOSIS — I130 Hypertensive heart and chronic kidney disease with heart failure and stage 1 through stage 4 chronic kidney disease, or unspecified chronic kidney disease: Principal | ICD-10-CM | POA: Diagnosis present

## 2017-09-11 DIAGNOSIS — R609 Edema, unspecified: Secondary | ICD-10-CM

## 2017-09-11 DIAGNOSIS — E1122 Type 2 diabetes mellitus with diabetic chronic kidney disease: Secondary | ICD-10-CM | POA: Diagnosis present

## 2017-09-11 DIAGNOSIS — E785 Hyperlipidemia, unspecified: Secondary | ICD-10-CM | POA: Diagnosis present

## 2017-09-11 DIAGNOSIS — N5082 Scrotal pain: Secondary | ICD-10-CM | POA: Diagnosis present

## 2017-09-11 DIAGNOSIS — R208 Other disturbances of skin sensation: Secondary | ICD-10-CM | POA: Diagnosis not present

## 2017-09-11 HISTORY — DX: Chronic combined systolic (congestive) and diastolic (congestive) heart failure: I50.42

## 2017-09-11 HISTORY — DX: Pelvic and perineal pain: R10.2

## 2017-09-11 HISTORY — DX: Chronic kidney disease, stage 3 unspecified: N18.30

## 2017-09-11 HISTORY — DX: Paroxysmal atrial fibrillation: I48.0

## 2017-09-11 HISTORY — DX: Cardiomyopathy, unspecified: I42.9

## 2017-09-11 HISTORY — DX: Chronic kidney disease, stage 3 (moderate): N18.3

## 2017-09-11 LAB — COMPREHENSIVE METABOLIC PANEL
ALK PHOS: 93 U/L (ref 38–126)
ALT: 20 U/L (ref 17–63)
AST: 27 U/L (ref 15–41)
Albumin: 3.1 g/dL — ABNORMAL LOW (ref 3.5–5.0)
Anion gap: 6 (ref 5–15)
BILIRUBIN TOTAL: 0.3 mg/dL (ref 0.3–1.2)
BUN: 28 mg/dL — ABNORMAL HIGH (ref 6–20)
CALCIUM: 8.4 mg/dL — AB (ref 8.9–10.3)
CO2: 22 mmol/L (ref 22–32)
CREATININE: 1.55 mg/dL — AB (ref 0.61–1.24)
Chloride: 111 mmol/L (ref 101–111)
GFR calc Af Amer: 48 mL/min — ABNORMAL LOW (ref >60)
GFR, EST NON AFRICAN AMERICAN: 41 mL/min — AB (ref >60)
Glucose, Bld: 138 mg/dL — ABNORMAL HIGH (ref 65–99)
Potassium: 4.6 mmol/L (ref 3.5–5.1)
Sodium: 139 mmol/L (ref 135–145)
TOTAL PROTEIN: 6.2 g/dL — AB (ref 6.5–8.1)

## 2017-09-11 LAB — GLUCOSE, CAPILLARY: Glucose-Capillary: 203 mg/dL — ABNORMAL HIGH (ref 65–99)

## 2017-09-11 LAB — CBC WITH DIFFERENTIAL/PLATELET
BASOS ABS: 0 10*3/uL (ref 0–0.1)
Basophils Relative: 0 %
Eosinophils Absolute: 0 10*3/uL (ref 0–0.7)
Eosinophils Relative: 1 %
HEMATOCRIT: 35.3 % — AB (ref 40.0–52.0)
Hemoglobin: 11.5 g/dL — ABNORMAL LOW (ref 13.0–18.0)
LYMPHS ABS: 1.2 10*3/uL (ref 1.0–3.6)
LYMPHS PCT: 19 %
MCH: 28.3 pg (ref 26.0–34.0)
MCHC: 32.6 g/dL (ref 32.0–36.0)
MCV: 86.7 fL (ref 80.0–100.0)
Monocytes Absolute: 0.5 10*3/uL (ref 0.2–1.0)
Monocytes Relative: 8 %
NEUTROS ABS: 4.5 10*3/uL (ref 1.4–6.5)
Neutrophils Relative %: 72 %
Platelets: 258 10*3/uL (ref 150–440)
RBC: 4.07 MIL/uL — AB (ref 4.40–5.90)
RDW: 15.8 % — ABNORMAL HIGH (ref 11.5–14.5)
WBC: 6.2 10*3/uL (ref 3.8–10.6)

## 2017-09-11 LAB — POCT INR: INR: 2.5 (ref 2.0–3.0)

## 2017-09-11 LAB — TROPONIN I
Troponin I: 0.03 ng/mL (ref <0.03)
Troponin I: 0.03 ng/mL (ref <0.03)

## 2017-09-11 LAB — BRAIN NATRIURETIC PEPTIDE: B NATRIURETIC PEPTIDE 5: 1046 pg/mL — AB (ref 0.0–100.0)

## 2017-09-11 LAB — TSH: TSH: 7.089 u[IU]/mL — ABNORMAL HIGH (ref 0.350–4.500)

## 2017-09-11 MED ORDER — ATORVASTATIN CALCIUM 20 MG PO TABS
20.0000 mg | ORAL_TABLET | Freq: Every day | ORAL | Status: DC
  Administered 2017-09-11 – 2017-09-15 (×5): 20 mg via ORAL
  Filled 2017-09-11 (×5): qty 1

## 2017-09-11 MED ORDER — SODIUM CHLORIDE 0.9 % IV SOLN: 250.0000 mL | INTRAVENOUS | Status: DC | PRN

## 2017-09-11 MED ORDER — WARFARIN - PHARMACIST DOSING INPATIENT
Freq: Every day | Status: DC
  Administered 2017-09-12: 19:00:00

## 2017-09-11 MED ORDER — FUROSEMIDE 10 MG/ML IJ SOLN: 40.0000 mg | Freq: Once | INTRAMUSCULAR | Status: DC

## 2017-09-11 MED ORDER — SODIUM CHLORIDE 0.9% FLUSH: 3.0000 mL | INTRAVENOUS | Status: DC | PRN

## 2017-09-11 MED ORDER — SALINE SPRAY 0.65 % NA SOLN
1.0000 | NASAL | Status: DC | PRN
  Filled 2017-09-11: qty 44

## 2017-09-11 MED ORDER — INSULIN ASPART 100 UNIT/ML ~~LOC~~ SOLN
0.0000 [IU] | Freq: Three times a day (TID) | SUBCUTANEOUS | Status: DC
  Administered 2017-09-12: 2 [IU] via SUBCUTANEOUS
  Administered 2017-09-12: 1 [IU] via SUBCUTANEOUS
  Administered 2017-09-13 – 2017-09-14 (×3): 3 [IU] via SUBCUTANEOUS
  Filled 2017-09-11 (×6): qty 1

## 2017-09-11 MED ORDER — ONDANSETRON HCL 4 MG PO TABS: 4.0000 mg | ORAL_TABLET | Freq: Four times a day (QID) | ORAL | Status: DC | PRN

## 2017-09-11 MED ORDER — FUROSEMIDE 10 MG/ML IJ SOLN
60.0000 mg | Freq: Once | INTRAMUSCULAR | Status: AC
  Administered 2017-09-11: 60 mg via INTRAVENOUS
  Filled 2017-09-11: qty 8

## 2017-09-11 MED ORDER — SODIUM CHLORIDE 0.9% FLUSH
3.0000 mL | Freq: Two times a day (BID) | INTRAVENOUS | Status: DC
  Administered 2017-09-11 – 2017-09-14 (×6): 3 mL via INTRAVENOUS

## 2017-09-11 MED ORDER — METOPROLOL TARTRATE 50 MG PO TABS
50.0000 mg | ORAL_TABLET | Freq: Two times a day (BID) | ORAL | Status: DC
  Administered 2017-09-11 – 2017-09-12 (×2): 50 mg via ORAL
  Filled 2017-09-11 (×2): qty 1

## 2017-09-11 MED ORDER — HYDROCODONE-ACETAMINOPHEN 5-325 MG PO TABS
1.0000 | ORAL_TABLET | ORAL | Status: DC | PRN
  Administered 2017-09-11 – 2017-09-15 (×4): 1 via ORAL
  Filled 2017-09-11 (×4): qty 1

## 2017-09-11 MED ORDER — MEDICAL COMPRESSION THIGH HIGH MISC: 1.0000 "application " | Freq: Every day | 0 refills | Status: DC

## 2017-09-11 MED ORDER — INSULIN GLARGINE 100 UNIT/ML ~~LOC~~ SOLN
7.0000 [IU] | Freq: Every day | SUBCUTANEOUS | Status: DC
  Administered 2017-09-12 – 2017-09-15 (×4): 7 [IU] via SUBCUTANEOUS
  Filled 2017-09-11 (×5): qty 0.07

## 2017-09-11 MED ORDER — POLYETHYLENE GLYCOL 3350 17 G PO PACK: 17.0000 g | PACK | Freq: Every day | ORAL | Status: DC | PRN

## 2017-09-11 MED ORDER — WARFARIN SODIUM 5 MG PO TABS
5.0000 mg | ORAL_TABLET | Freq: Every day | ORAL | Status: DC
  Administered 2017-09-11 – 2017-09-14 (×4): 5 mg via ORAL
  Filled 2017-09-11 (×4): qty 1

## 2017-09-11 MED ORDER — ACETAMINOPHEN 325 MG PO TABS
650.0000 mg | ORAL_TABLET | Freq: Four times a day (QID) | ORAL | Status: DC | PRN
  Administered 2017-09-13: 650 mg via ORAL
  Filled 2017-09-11: qty 2

## 2017-09-11 MED ORDER — ACETAMINOPHEN 650 MG RE SUPP: 650.0000 mg | Freq: Four times a day (QID) | RECTAL | Status: DC | PRN

## 2017-09-11 MED ORDER — ONDANSETRON HCL 4 MG/2ML IJ SOLN: 4.0000 mg | Freq: Four times a day (QID) | INTRAMUSCULAR | Status: DC | PRN

## 2017-09-11 MED ORDER — AMIODARONE HCL 200 MG PO TABS
200.0000 mg | ORAL_TABLET | Freq: Every day | ORAL | Status: DC
  Administered 2017-09-12 – 2017-09-15 (×4): 200 mg via ORAL
  Filled 2017-09-11 (×4): qty 1

## 2017-09-11 MED ORDER — MOMETASONE FURO-FORMOTEROL FUM 200-5 MCG/ACT IN AERO
2.0000 | INHALATION_SPRAY | Freq: Two times a day (BID) | RESPIRATORY_TRACT | Status: DC
  Administered 2017-09-11 – 2017-09-15 (×8): 2 via RESPIRATORY_TRACT
  Filled 2017-09-11: qty 8.8

## 2017-09-11 MED ORDER — FUROSEMIDE 10 MG/ML IJ SOLN
40.0000 mg | Freq: Two times a day (BID) | INTRAMUSCULAR | Status: DC
  Administered 2017-09-12 – 2017-09-13 (×4): 40 mg via INTRAVENOUS
  Filled 2017-09-11 (×4): qty 4

## 2017-09-11 NOTE — ED Notes (Signed)
MD Alford [place] reports cardiac monitor is not needed at this time. Pt assisted onto stretcher. Pt reports swelling to lower legs and penis/testicles. VSS. Pt denies CP or SOB. Pt reports his cardiologist sent him here "to get all that fluid out of me."

## 2017-09-11 NOTE — ED Notes (Signed)
Date and time results received: 09/11/17 1500 (use smartphrase ".now" to insert current time)  Test: troponin Critical Value: 0.03  Name of Provider Notified: norman

## 2017-09-11 NOTE — ED Notes (Signed)
Report called to [place] RN on floor and pt/family updated with plan of care.

## 2017-09-11 NOTE — Progress Notes (Signed)
Family Meeting Note  Advance Directive:no  Today a meeting took place with the Patient.and daughter    The following clinical team members were present during this meeting:MD  The following were discussed:Patient's diagnosis: Acute on chronic combined systolic and diastolic heart failure, Patient's progosis: Unable to determine and Goals for treatment: Full Code  Additional follow-up to be provided: Chaplain consulted for advanced directives.  Time spent during discussion:16 minutes  Rozalyn Osland, MD

## 2017-09-11 NOTE — H&P (Signed)
[facility] NAME: Gregory Tyler    MR#:  [id number]  DATE OF BIRTH:  16-Feb-1941  DATE OF ADMISSION:  09/11/2017  PRIMARY CARE PHYSICIAN: [name], MD   REQUESTING/REFERRING PHYSICIAN: dr Quentin Cornwall  CHIEF COMPLAINT:   Sob and weight gain HISTORY OF PRESENT ILLNESS:  Judith Campillo  is a 77 y.o. 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  Over the past several weeks patient has had increasing shortness of breath, PND, orthopnea, dyspnea exertion and 11 to 12 pound weight gain. He denies chest pain, travel history, fever or chills.  PAST MEDICAL HISTORY:   Past Medical History:  Diagnosis Date  . Anemia   . Anxiety   . Arthritis   . CHF (congestive heart failure) (HCC)    Combined Systolic and Diastolic; EF 35-57% in 32/2025  . Chronic atrial fibrillation (HCC)    a. on Xarelto  . Coronary artery disease   . Depression   . Diabetes mellitus without complication ([place])   . Dysrhythmia   . Emphysema lung ([place])   . Gastric ulcer   . Gout   . Hyperlipidemia   . Hypertension   . Nephrolithiasis   . Prostate cancer ([place])   . Shortness of breath dyspnea   . Weight loss    50 lb since Apr 24, 2015    PAST SURGICAL HISTORY:   Past Surgical History:  Procedure Laterality Date  . APPENDECTOMY    . BACK SURGERY    . CHOLECYSTECTOMY    . ELECTROPHYSIOLOGIC STUDY N/A 06/17/2015   Procedure: CARDIOVERSION;  Surgeon: Wellington Hampshire, MD;  Location: [facility] ORS;  Service: Cardiovascular;  Laterality: N/A;  . ELECTROPHYSIOLOGIC STUDY N/A 03/30/2016   Procedure: CARDIOVERSION;  Surgeon: Minna Merritts, MD;  Location: [facility] ORS;  Service: Cardiovascular;  Laterality: N/A;  . GASTRIC BYPASS    . HEMORRHOID SURGERY    .  pilonidal cyst    . TEE WITHOUT CARDIOVERSION N/A 06/17/2015   Procedure: TRANSESOPHAGEAL ECHOCARDIOGRAM (TEE);  Surgeon: Wellington Hampshire, MD;  Location: [facility] ORS;  Service: Cardiovascular;  Laterality: N/A;    SOCIAL HISTORY:   Social History   Tobacco Use  . Smoking status: Former Research scientist (life sciences)  . Smokeless tobacco: Former Network engineer Use Topics  . Alcohol use: No    FAMILY HISTORY:   Family History  Problem Relation Age of Onset  . CAD Mother   . Stroke Mother   . Diabetes Other     DRUG ALLERGIES:   Allergies  Allergen Reactions  . Aspirin     GI Bleeding  . Codeine Other (See Comments)    BLISTERS Other reaction(s): Other (See Comments) BLISTERS   . Codeine Sulfate Other (See Comments)    Other reaction(s): Other (See Comments) BLISTERS BLISTERS    REVIEW OF SYSTEMS:   Review of Systems  Constitutional: Positive for malaise/fatigue. Negative for chills and fever.  HENT: Negative.  Negative for ear discharge, ear pain, hearing loss, nosebleeds and sore throat.   Eyes: Negative.  Negative for blurred vision and pain.  Respiratory: Positive for shortness of breath. Negative for cough, hemoptysis and wheezing.   Cardiovascular: Positive for orthopnea, leg swelling and PND. Negative for chest pain and palpitations.  Gastrointestinal: Negative.  Negative for  abdominal pain, blood in stool, diarrhea, nausea and vomiting.  Genitourinary: Negative.  Negative for dysuria.  Musculoskeletal: Negative.  Negative for back pain.  Skin: Negative.   Neurological: Negative for dizziness, tremors, speech change, focal weakness, seizures and headaches.  Endo/Heme/Allergies: Negative.  Does not bruise/bleed easily.  Psychiatric/Behavioral: Negative.  Negative for depression, hallucinations and suicidal ideas.    MEDICATIONS AT HOME:   Prior to Admission medications   Medication Sig Start Date End Date Taking? Authorizing Provider  amiodarone (PACERONE) 200 MG tablet Take 1  tablet (200 mg total) by mouth daily. 04/27/16  Yes Minna Merritts, MD  atorvastatin (LIPITOR) 20 MG tablet Take 20 mg by mouth daily.   Yes [provider]  metoprolol tartrate (LOPRESSOR) 25 MG tablet Take 2 tablets (50 mg total) by mouth 2 (two) times daily. 08/05/17  Yes Minna Merritts, MD  warfarin (COUMADIN) 2.5 MG tablet TAKE 1 TABLET (2.5 MG TOTAL) BY MOUTH DAILY Patient taking differently: Take 5 mg by mouth daily.  06/24/17  Yes Gollan, Kathlene November, MD  albuterol (PROVENTIL HFA;VENTOLIN HFA) 108 (90 Base) MCG/ACT inhaler Inhale 2 puffs into the lungs every 6 (six) hours as needed for wheezing or shortness of breath. 08/08/15   Minna Merritts, MD  Elastic Bandages & Supports (MEDICAL COMPRESSION THIGH HIGH) MISC 1 application by Does not apply route daily. 09/11/17   Merlyn Lot, MD  furosemide (LASIX) 20 MG tablet Take 1 tablet (20 mg total) by mouth daily as needed. 05/29/17   Minna Merritts, MD  metFORMIN (GLUCOPHAGE) 500 MG tablet Take by mouth 2 (two) times daily with a meal.    [provider]  sodium chloride (OCEAN) 0.65 % SOLN nasal spray Place 1 spray into both nostrils as needed for congestion.    [provider]  SYMBICORT 160-4.5 MCG/ACT inhaler Inhale 2 puffs into the lungs 2 (two) times daily.  12/21/15   [provider]      VITAL SIGNS:  Blood pressure (!) 183/93, pulse 65, temperature 98.3 F (36.8 C), temperature source Oral, resp. rate 18, height 5\' 8"  (1.727 m), weight 81.6 kg (180 lb), SpO2 98 %.  PHYSICAL EXAMINATION:   Physical Exam  Constitutional: He is oriented to person, place, and time. No distress.  HENT:  Head: Normocephalic.  Eyes: No scleral icterus.  Neck: Normal range of motion. Neck supple. JVD present. No tracheal deviation present.  Cardiovascular: Normal rate and regular rhythm. Exam reveals no gallop and no friction rub.  Murmur heard. Pulmonary/Chest: Effort normal and breath sounds normal. No  respiratory distress. He has no wheezes. He has no rales. He exhibits no tenderness.  Abdominal: Soft. Bowel sounds are normal. He exhibits no distension and no mass. There is no tenderness. There is no rebound and no guarding.  Genitourinary:  Genitourinary Comments: Scrotal edema  Musculoskeletal: Normal range of motion. He exhibits edema.  Lower extremity extends through the scrotum  Neurological: He is alert and oriented to person, place, and time.  Skin: Skin is warm. No rash noted. No erythema.  Psychiatric: Judgment normal.      LABORATORY PANEL:   CBC Recent Labs  Lab 09/11/17 1351  WBC 6.2  HGB 11.5*  HCT 35.3*  PLT 258   ------------------------------------------------------------------------------------------------------------------  Chemistries  Recent Labs  Lab 09/11/17 1351  NA 139  K 4.6  CL 111  CO2 22  GLUCOSE 138*  BUN 28*  CREATININE 1.55*  CALCIUM 8.4*  AST 27  ALT 20  ALKPHOS 93  BILITOT 0.3   ------------------------------------------------------------------------------------------------------------------  Cardiac Enzymes Recent Labs  Lab 09/11/17 1351  TROPONINI 0.03*   ------------------------------------------------------------------------------------------------------------------  RADIOLOGY:  Dg Chest 2 View  Result Date: 09/11/2017 CLINICAL DATA:  Cough. EXAM: CHEST - 2 VIEW COMPARISON:  Chest x-ray dated 06/10/2015 FINDINGS: The heart size and mediastinal contours are within normal limits. Both lungs are clear. The visualized skeletal structures are unremarkable. IMPRESSION: No active cardiopulmonary disease. Electronically Signed   By: Lorriane Shire M.D.   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   1.  Acute on chronic combined systolic and diastolic heart failure: With shortness of breath, lower extremity edema, weight gain, JVD, elevated BNP  Lasix 40 mg IV every 12 h Monitor intake and output with daily weight Repeat echocardiogram as last one was in 2017 [facility] consultation requested CHF clinic upon discharge Check TSH  2 chronic atrial fibrillation currently in sinus bradycardia Coumadin as per pharmacy consultation Continue amiodarone and metoprolol for heart rate control  3.  Diabetes: Hold metformin in its place use low-dose Lantus with sliding scale and ADA diet  4.  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 Management plans discussed with the patient and daughter and they are in agreement  CODE STATUS: Full  TOTAL TIME TAKING CARE OF THIS PATIENT: 39 minutes.    Raye Slyter M.D on 09/11/2017 at 6:11 PM  Between 7am to 6pm - Pager - [phone]  After 6pm go to [email or web address] - password [email or web address]  Office  [phone]  CC: Primary care physician; [name], MD

## 2017-09-11 NOTE — Progress Notes (Signed)
ANTICOAGULATION CONSULT NOTE - Initial Consult  Pharmacy Consult for warfarin Indication: atrial fibrillation  Allergies  Allergen Reactions  . Aspirin     GI Bleeding  . Codeine Other (See Comments)    BLISTERS Other reaction(s): Other (See Comments) BLISTERS   . Codeine Sulfate Other (See Comments)    Other reaction(s): Other (See Comments) BLISTERS BLISTERS    Patient Measurements: Height: 5\' 8"  (172.7 cm) Weight: 180 lb (81.6 kg) IBW/kg (Calculated) : 68.4  Vital Signs: Temp: 98.3 F (36.8 C) (05/22 1349) Temp Source: Oral (05/22 1349) BP: 183/93 (05/22 1700) Pulse Rate: 65 (05/22 1700)  Labs: Recent Labs    09/11/17 1303 09/11/17 1351  HGB  --  11.5*  HCT  --  35.3*  PLT  --  258  INR 2.5  --   CREATININE  --  1.55*  TROPONINI  --  0.03*    Estimated Creatinine Clearance: 38.6 mL/min (A) (by C-G formula based on SCr of 1.55 mg/dL (H)).   Medical History: Past Medical History:  Diagnosis Date  . Anemia   . Anxiety   . Arthritis   . CHF (congestive heart failure) (HCC)    Combined Systolic and Diastolic; EF 56-38% in 75/6433  . Chronic atrial fibrillation (HCC)    a. on Xarelto  . Coronary artery disease   . Depression   . Diabetes mellitus without complication ([place])   . Dysrhythmia   . Emphysema lung ([place])   . Gastric ulcer   . Gout   . Hyperlipidemia   . Hypertension   . Nephrolithiasis   . Prostate cancer ([place])   . Shortness of breath dyspnea   . Weight loss    50 lb since Apr 24, 2015    Medications:   (Not in a hospital admission) Scheduled:  . warfarin  5 mg Oral q1800  . [START ON 09/12/2017] Warfarin - Pharmacist Dosing Inpatient   Does not apply q1800   Infusions:   PRN:  Anti-infectives (From admission, onward)   None      Assessment: 77 year old male requiring warfarin therapy for afib. Patient presents therapeutic on warfarin with inr 2.5. Will continue on warfarin 5mg  po daily like home dose, but will monitor  daily initially. Has had some supratherapeutic INRs in recent stays. Drug interaction with amiodarone which he is also on at home.   Goal of Therapy:  INR 2-3 Monitor platelets by anticoagulation protocol: Yes   Plan:  Warfarin 5mg  po daily. INR daily and CBC per protocol , Pharmacist to follow.   Donna Christen Kaitlyn Skowron 09/11/2017,6:16 PM

## 2017-09-11 NOTE — ED Triage Notes (Addendum)
Pt arrived after being sent by cardiologist for swelling. Pt reports swelling from waist down for last "few months." Pt states he has been seen for this by "so many doctors" but has yet to find out the reason. Pt states his groin and testicles are "so swollen" and causing him pain. Pt denies any chest pain. Pt states when he sits still he falls asleep, pt falling asleep in triage between questions. Pt is easily aroused to voice and is alert and oriented x 4.

## 2017-09-11 NOTE — Patient Instructions (Signed)
Please START NEW dosage of 1 tablet every day. Recheck in 2 weeks.

## 2017-09-11 NOTE — Discharge Instructions (Signed)
Please be sure to take your Lasix daily and I do recommend that you increase your Lasix to 40 mg every morning for the past 3 days.

## 2017-09-11 NOTE — ED Notes (Signed)
Pt up to [facility] to urinate and have BM, pt is 1 person assist.

## 2017-09-11 NOTE — ED Triage Notes (Signed)
Pt alert and watching TV. Unlabored at this time.

## 2017-09-11 NOTE — ED Provider Notes (Signed)
[facility] Emergency Department Provider Note    First MD Initiated Contact with Patient 09/11/17 1539     (approximate)  I have reviewed the triage vital signs and the nursing notes.   HISTORY  Chief Complaint Leg Swelling    HPI Gregory Tyler is a 77 y.o. male with a history of multiple chronic medical conditions currently on Coumadin for history of A. fib as well as combined systolic and diastolic heart failure with a history of noncompliance with his p.o. Lasix.  Patient has been having lower extremity swelling for the past several months and scrotal swelling as well.  Patient denies any shortness of breath or chest pain.  Was reportedly told to come to the ER by his cardiologist however documentation shows that he was scheduled for follow-up in 2 to 3 weeks with plan to increase his p.o. Lasix.  Past Medical History:  Diagnosis Date  . Anemia   . Anxiety   . Arthritis   . CHF (congestive heart failure) (HCC)    Combined Systolic and Diastolic; EF 37-34% in 28/7681  . Chronic atrial fibrillation (HCC)    a. on Xarelto  . Coronary artery disease   . Depression   . Diabetes mellitus without complication ([place])   . Dysrhythmia   . Emphysema lung ([place])   . Gastric ulcer   . Gout   . Hyperlipidemia   . Hypertension   . Nephrolithiasis   . Prostate cancer ([place])   . Shortness of breath dyspnea   . Weight loss    50 lb since Apr 24, 2015   Family History  Problem Relation Age of Onset  . CAD Mother   . Stroke Mother   . Diabetes Other    Past Surgical History:  Procedure Laterality Date  . APPENDECTOMY    . BACK SURGERY    . CHOLECYSTECTOMY    . ELECTROPHYSIOLOGIC STUDY N/A 06/17/2015   Procedure: CARDIOVERSION;  Surgeon: Wellington Hampshire, MD;  Location: [facility] ORS;  Service: Cardiovascular;  Laterality: N/A;  . ELECTROPHYSIOLOGIC STUDY N/A 03/30/2016   Procedure: CARDIOVERSION;  Surgeon: Minna Merritts, MD;  Location: [facility] ORS;   Service: Cardiovascular;  Laterality: N/A;  . GASTRIC BYPASS    . HEMORRHOID SURGERY    . pilonidal cyst    . TEE WITHOUT CARDIOVERSION N/A 06/17/2015   Procedure: TRANSESOPHAGEAL ECHOCARDIOGRAM (TEE);  Surgeon: Wellington Hampshire, MD;  Location: [facility] ORS;  Service: Cardiovascular;  Laterality: N/A;   Patient Active Problem List   Diagnosis Date Noted  . Elevated INR 09/02/2017  . Paroxysmal atrial fibrillation ([place]) 10/25/2016  . Chronic fatigue   . Encounter for anticoagulation discussion and counseling 09/21/2015  . Hyperlipidemia 08/08/2015  . Chronic systolic heart failure ([place]) 05/19/2015  . Diabetes ([place]) 05/19/2015  . Hyperkalemia   . Dehydration   . Orthostatic hypotension 05/09/2015  . Congestive dilated cardiomyopathy ([place]) 05/09/2015  . Polypharmacy 04/27/2015  . Chronic diastolic CHF (congestive heart failure) ([place]) 04/25/2015  . Acute on chronic combined systolic and diastolic CHF (congestive heart failure) ([place])   . SOB (shortness of breath)   . Coronary artery disease involving native coronary artery of native heart without angina pectoris   . Essential hypertension       Prior to Admission medications   Medication Sig Start Date End Date Taking? Authorizing Provider  albuterol (PROVENTIL HFA;VENTOLIN HFA) 108 (90 Base) MCG/ACT inhaler Inhale 2 puffs into the lungs every 6 (six) hours as needed for  wheezing or shortness of breath. 08/08/15   Minna Merritts, MD  amiodarone (PACERONE) 200 MG tablet Take 1 tablet (200 mg total) by mouth daily. 04/27/16   Minna Merritts, MD  atorvastatin (LIPITOR) 20 MG tablet Take 20 mg by mouth daily.    [provider]  furosemide (LASIX) 20 MG tablet Take 1 tablet (20 mg total) by mouth daily as needed. 05/29/17   Minna Merritts, MD  HYDROcodone-acetaminophen (NORCO/VICODIN) 5-325 MG tablet Take 1 tablet by mouth every 4 (four) hours as needed for moderate pain.    [provider]  metFORMIN (GLUCOPHAGE) 500 MG  tablet Take by mouth 2 (two) times daily with a meal.    [provider]  metoprolol tartrate (LOPRESSOR) 25 MG tablet Take 2 tablets (50 mg total) by mouth 2 (two) times daily. 08/05/17   Minna Merritts, MD  sodium chloride (OCEAN) 0.65 % SOLN nasal spray Place 1 spray into both nostrils as needed for congestion.    [provider]  SYMBICORT 160-4.5 MCG/ACT inhaler Inhale 2 puffs into the lungs 2 (two) times daily.  12/21/15   [provider]  warfarin (COUMADIN) 2.5 MG tablet TAKE 1 TABLET (2.5 MG TOTAL) BY MOUTH DAILY 06/24/17   Minna Merritts, MD    Allergies Aspirin; Codeine; and Codeine sulfate    Social History Social History   Tobacco Use  . Smoking status: Former Research scientist (life sciences)  . Smokeless tobacco: Former Network engineer Use Topics  . Alcohol use: No  . Drug use: No    Review of Systems Patient denies headaches, rhinorrhea, blurry vision, numbness, shortness of breath, chest pain, edema, cough, abdominal pain, nausea, vomiting, diarrhea, dysuria, fevers, rashes or hallucinations unless otherwise stated above in HPI. ____________________________________________   PHYSICAL EXAM:  VITAL SIGNS: Vitals:   09/11/17 1349  BP: (!) 159/62  Pulse: (!) 50  Resp: 18  Temp: 98.3 F (36.8 C)  SpO2: 100%    Constitutional: Alert and oriented. Frail and elderly appearing and in no acute distress. Eyes: Conjunctivae are normal.  Head: Atraumatic. Nose: No congestion/rhinnorhea. Mouth/Throat: Mucous membranes are moist.   Neck: No stridor. Painless ROM.  Cardiovascular: Normal rate, regular rhythm. Grossly normal heart sounds.  Good peripheral circulation. Respiratory: Normal respiratory effort.  No retractions. Lungs CTAB. Gastrointestinal: Soft and nontender. No distention. No abdominal bruits. Reducible right inguinal herniaNo CVA tenderness. Genitourinary: large swollen, scrotum with edema but no erythema, crepitus or evidence of infectious  process Musculoskeletal: No lower extremity tenderness 2+ BLE edema.  No joint effusions. Neurologic:  Normal speech and language. No gross focal neurologic deficits are appreciated. No facial droop Skin:  Skin is warm, dry and intact. No rash noted. Psychiatric: Mood and affect are normal. Speech and behavior are normal.  ____________________________________________   LABS (all labs ordered are listed, but only abnormal results are displayed)  Results for orders placed or performed during the hospital encounter of 09/11/17 (from the past 24 hour(s))  CBC with Differential     Status: Abnormal   Collection Time: 09/11/17  1:51 PM  Result Value Ref Range   WBC 6.2 3.8 - 10.6 K/uL   RBC 4.07 (L) 4.40 - 5.90 MIL/uL   Hemoglobin 11.5 (L) 13.0 - 18.0 g/dL   HCT 35.3 (L) 40.0 - 52.0 %   MCV 86.7 80.0 - 100.0 fL   MCH 28.3 26.0 - 34.0 pg   MCHC 32.6 32.0 - 36.0 g/dL   RDW 15.8 (H) 11.5 -  14.5 %   Platelets 258 150 - 440 K/uL   Neutrophils Relative % 72 %   Neutro Abs 4.5 1.4 - 6.5 K/uL   Lymphocytes Relative 19 %   Lymphs Abs 1.2 1.0 - 3.6 K/uL   Monocytes Relative 8 %   Monocytes Absolute 0.5 0.2 - 1.0 K/uL   Eosinophils Relative 1 %   Eosinophils Absolute 0.0 0 - 0.7 K/uL   Basophils Relative 0 %   Basophils Absolute 0.0 0 - 0.1 K/uL  Comprehensive metabolic panel     Status: Abnormal   Collection Time: 09/11/17  1:51 PM  Result Value Ref Range   Sodium 139 135 - 145 mmol/L   Potassium 4.6 3.5 - 5.1 mmol/L   Chloride 111 101 - 111 mmol/L   CO2 22 22 - 32 mmol/L   Glucose, Bld 138 (H) 65 - 99 mg/dL   BUN 28 (H) 6 - 20 mg/dL   Creatinine, Ser 1.55 (H) 0.61 - 1.24 mg/dL   Calcium 8.4 (L) 8.9 - 10.3 mg/dL   Total Protein 6.2 (L) 6.5 - 8.1 g/dL   Albumin 3.1 (L) 3.5 - 5.0 g/dL   AST 27 15 - 41 U/L   ALT 20 17 - 63 U/L   Alkaline Phosphatase 93 38 - 126 U/L   Total Bilirubin 0.3 0.3 - 1.2 mg/dL   GFR calc non Af Amer 41 (L) >60 mL/min   GFR calc Af Amer 48 (L) >60 mL/min    Anion gap 6 5 - 15  Troponin I     Status: Abnormal   Collection Time: 09/11/17  1:51 PM  Result Value Ref Range   Troponin I 0.03 (HH) <0.03 ng/mL   ____________________________________________  EKG My review and personal interpretation at Time: 14:13   Indication: edema  Rate: 55  Rhythm: sinus Axis: left Other: lbbb, no stemi, otherwise normal intervals ____________________________________________  RADIOLOGY  I personally reviewed all radiographic images ordered to evaluate for the above acute complaints and reviewed radiology reports and findings.  These findings were personally discussed with the patient.  Please see medical record for radiology report.  ____________________________________________   PROCEDURES  Procedure(s) performed:  Procedures    Critical Care performed: no ____________________________________________   INITIAL IMPRESSION / ASSESSMENT AND PLAN / ED COURSE  Pertinent labs & imaging results that were available during my care of the patient were reviewed by me and considered in my medical decision making (see chart for details).  DDX: anasarca, chf, edema, dvt, lymphedema  SUYASH AMORY is a 77 y.o. who presents to the ED with symptoms as described above.  Patient nontoxic but is frail appearing.  I do suspect a large component of his presentation secondary to noncompliance after discussing case with Dr. Rockey Situ.  Mild elevation of troponin likely secondary to underlying CHF and mild CKD.  Will give IV lasix.  No evidence of pulmonary edema at this time.    Clinical Course as of Sep 12 1803  Wed Sep 11, 2017  1756 Patient is having adequate and active diuresis after IV Lasix.  Again denies any shortness of breath or chest pain.  No hypoxia.  Does not appear profoundly overloaded at this point is most likely secondary to noncompliance.  Patient also noted to have some right lower quadrant abdominal pain that he states is been intermittent for the  past several months.  On exam patient does have a reducible inguinal hernia.  Daughter is at bedside and states that she  has been having issues with patient being noncompliant.  Does seem to have a significant stressor at home due to significant a required by his aging wife.  She has home health but he does not.  Discussed option of admission the hospital for IV diuresis versus outpatient follow-up for continued diuresis blood pressure check as well as referral to home health.  Patient was weighed and typically weighs 1 69-1 70 and is now 181.  Seems to be a 10 pound weight gain over the past few weeks.   [PR]  1610   This point do feel patient would benefit from admission for additional diuresis and reassessment.   [PR]    Clinical Course User Index [PR] Merlyn Lot, MD     As part of my medical decision making, I reviewed the following data within the [place] notes reviewed and incorporated, Labs reviewed, notes from prior ED visits and [place] Controlled Substance Database   ____________________________________________   FINAL CLINICAL IMPRESSION(S) / ED DIAGNOSES  Final diagnoses:  Peripheral edema  Other hypervolemia      NEW MEDICATIONS STARTED DURING THIS VISIT:  New Prescriptions   No medications on file     Note:  This document was prepared using Dragon voice recognition software and may include unintentional dictation errors.    Merlyn Lot, MD 09/11/17 1810

## 2017-09-12 ENCOUNTER — Encounter: Payer: Self-pay | Admitting: Nurse Practitioner

## 2017-09-12 ENCOUNTER — Inpatient Hospital Stay (HOSPITAL_COMMUNITY)
Admit: 2017-09-12 | Discharge: 2017-09-12 | Disposition: A | Discharge status: Home / Self Care | Payer: Medicare Other | Attending: Internal Medicine | Admitting: Internal Medicine

## 2017-09-12 DIAGNOSIS — N5089 Other specified disorders of the male genital organs: Secondary | ICD-10-CM

## 2017-09-12 DIAGNOSIS — I5043 Acute on chronic combined systolic (congestive) and diastolic (congestive) heart failure: Secondary | ICD-10-CM

## 2017-09-12 DIAGNOSIS — R609 Edema, unspecified: Secondary | ICD-10-CM

## 2017-09-12 DIAGNOSIS — I340 Nonrheumatic mitral (valve) insufficiency: Secondary | ICD-10-CM

## 2017-09-12 DIAGNOSIS — I420 Dilated cardiomyopathy: Secondary | ICD-10-CM

## 2017-09-12 LAB — GLUCOSE, CAPILLARY
Glucose-Capillary: 155 mg/dL — ABNORMAL HIGH (ref 65–99)
Glucose-Capillary: 135 mg/dL — ABNORMAL HIGH (ref 65–99)

## 2017-09-12 LAB — BASIC METABOLIC PANEL
ANION GAP: 5 (ref 5–15)
BUN: 31 mg/dL — ABNORMAL HIGH (ref 6–20)
CHLORIDE: 110 mmol/L (ref 101–111)
CO2: 25 mmol/L (ref 22–32)
Calcium: 8 mg/dL — ABNORMAL LOW (ref 8.9–10.3)
Creatinine, Ser: 1.43 mg/dL — ABNORMAL HIGH (ref 0.61–1.24)
GFR calc non Af Amer: 46 mL/min — ABNORMAL LOW (ref >60)
GFR, EST AFRICAN AMERICAN: 53 mL/min — AB (ref >60)
Glucose, Bld: 173 mg/dL — ABNORMAL HIGH (ref 65–99)
POTASSIUM: 4 mmol/L (ref 3.5–5.1)
Sodium: 140 mmol/L (ref 135–145)

## 2017-09-12 LAB — HEMOGLOBIN A1C
Hgb A1c MFr Bld: 6.8 % — ABNORMAL HIGH (ref 4.8–5.6)
Mean Plasma Glucose: 148.46 mg/dL

## 2017-09-12 LAB — CBC
HEMATOCRIT: 31.7 % — AB (ref 40.0–52.0)
HEMOGLOBIN: 10.4 g/dL — AB (ref 13.0–18.0)
MCH: 28.1 pg (ref 26.0–34.0)
MCHC: 32.9 g/dL (ref 32.0–36.0)
MCV: 85.6 fL (ref 80.0–100.0)
Platelets: 225 10*3/uL (ref 150–440)
RBC: 3.71 MIL/uL — ABNORMAL LOW (ref 4.40–5.90)
RDW: 15.8 % — ABNORMAL HIGH (ref 11.5–14.5)
WBC: 5.4 10*3/uL (ref 3.8–10.6)

## 2017-09-12 LAB — TROPONIN I
Troponin I: 0.03 ng/mL (ref <0.03)
Troponin I: 0.03 ng/mL (ref <0.03)

## 2017-09-12 LAB — PROTIME-INR
INR: 2.31
Prothrombin Time: 25.2 seconds — ABNORMAL HIGH (ref 11.4–15.2)

## 2017-09-12 MED ORDER — CARVEDILOL 6.25 MG PO TABS
6.2500 mg | ORAL_TABLET | Freq: Two times a day (BID) | ORAL | Status: DC
  Administered 2017-09-12 – 2017-09-13 (×2): 6.25 mg via ORAL
  Filled 2017-09-12 (×2): qty 1

## 2017-09-12 MED ORDER — LISINOPRIL 5 MG PO TABS
5.0000 mg | ORAL_TABLET | Freq: Every day | ORAL | Status: DC
  Administered 2017-09-12 – 2017-09-15 (×4): 5 mg via ORAL
  Filled 2017-09-12 (×4): qty 1

## 2017-09-12 NOTE — Clinical Social Work Note (Signed)
CSW received consult that patient needs assistance with paying for his medications, CSW notified case manager who can assist with this, CSW to sign off.  Jones Broom. [place], MSW, [place]  09/12/2017 12:13 PM

## 2017-09-12 NOTE — Progress Notes (Signed)
[facility] [place] NAME: Gregory Tyler    MR#:  [id number]  DATE OF BIRTH:  [birth year]  SUBJECTIVE:  Patient seen and evaluated today  has swelling in the scrotum and lower extremities Decreased shortness of breath No chest pain No fever and chills  REVIEW OF SYSTEMS:    ROS  CONSTITUTIONAL: No documented fever. No fatigue, weakness. No weight gain, no weight loss.  EYES: No blurry or double vision.  ENT: No tinnitus. No postnasal drip. No redness of the oropharynx.  RESPIRATORY: No cough, no wheeze, no hemoptysis. No dyspnea.  CARDIOVASCULAR: No chest pain. No orthopnea. No palpitations. No syncope.  GASTROINTESTINAL: No nausea, no vomiting or diarrhea. No abdominal pain. No melena or hematochezia.  GENITOURINARY: No dysuria or hematuria.  ENDOCRINE: No polyuria or nocturia. No heat or cold intolerance.  HEMATOLOGY: No anemia. No bruising. No bleeding.  INTEGUMENTARY: No rashes. No lesions.  MUSCULOSKELETAL: No arthritis. Has swelling of lower extremities. No gout.  NEUROLOGIC: No numbness, tingling, or ataxia. No seizure-type activity.  PSYCHIATRIC: No anxiety. No insomnia. No ADD.   DRUG ALLERGIES:   Allergies  Allergen Reactions  . Aspirin     GI Bleeding  . Codeine Other (See Comments)    BLISTERS Other reaction(s): Other (See Comments) BLISTERS   . Codeine Sulfate Other (See Comments)    Other reaction(s): Other (See Comments) BLISTERS BLISTERS    VITALS:  Blood pressure 124/67, pulse (!) 45, temperature 98.2 F (36.8 C), temperature source Oral, resp. rate 18, height 5\' 8"  (1.727 m), weight 73.3 kg (161 lb 11.2 oz), SpO2 97 %.  PHYSICAL EXAMINATION:   Physical Exam  GENERAL:  77 y.o.-year-old patient lying in the bed with no acute distress.  EYES: Pupils equal, round, reactive to light and accommodation. No scleral icterus. Extraocular muscles intact.  HEENT: Head atraumatic, normocephalic. Oropharynx and  nasopharynx clear.  NECK:  Supple, no jugular venous distention. No thyroid enlargement, no tenderness.  LUNGS: Decreased breath sounds bilaterally, bibasilar crepitations decreased. No use of accessory muscles of respiration.  CARDIOVASCULAR: S1, S2 normal. No murmurs, rubs, or gallops.  ABDOMEN: Soft, nontender, nondistended. Bowel sounds present. No organomegaly or mass.   GU : Has scrotal edema EXTREMITIES: No cyanosis, clubbing  Bilateral lower extremity edema NEUROLOGIC: Cranial nerves II through XII are intact. No focal Motor or sensory deficits b/l.   PSYCHIATRIC: The patient is alert and oriented x 3.  SKIN: No obvious rash, lesion, or ulcer.   LABORATORY PANEL:   CBC Recent Labs  Lab 09/12/17 0143  WBC 5.4  HGB 10.4*  HCT 31.7*  PLT 225   ------------------------------------------------------------------------------------------------------------------ Chemistries  Recent Labs  Lab 09/11/17 1351 09/12/17 0143  NA 139 140  K 4.6 4.0  CL 111 110  CO2 22 25  GLUCOSE 138* 173*  BUN 28* 31*  CREATININE 1.55* 1.43*  CALCIUM 8.4* 8.0*  AST 27  --   ALT 20  --   ALKPHOS 93  --   BILITOT 0.3  --    ------------------------------------------------------------------------------------------------------------------  Cardiac Enzymes Recent Labs  Lab 09/12/17 0804  TROPONINI 0.03*   ------------------------------------------------------------------------------------------------------------------  RADIOLOGY:  Dg Chest 2 View  Result Date: 09/11/2017 CLINICAL DATA:  Cough. EXAM: CHEST - 2 VIEW COMPARISON:  Chest x-ray dated 06/10/2015 FINDINGS: The heart size and mediastinal contours are within normal limits. Both lungs are clear. The visualized skeletal structures are unremarkable. IMPRESSION: No active cardiopulmonary disease. Electronically Signed   By: Jeneen Rinks  Maxwell M.D.   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  -Acute on chronic combined systolic diastolic heart failure Follow-up echocardiogram report Continue diuresis with IV Lasix 40 mg every 12 hourly Cardiology consultation follow-up CHF clinic upon discharge Daily body weights Input output chart Monitor electrolytes  -Chronic atrial fibrillation  currently on Coumadin for anticoagulation INR is therapeutic  -COPD stable continue home dose inhalers  -Diabetes mellitus type 2  currently on sliding scale insulin And Lantus insulin low-dose continue diabetic diet  -DVT prophylaxis On Coumadin for anticoagulation   All the records are reviewed and case discussed with Care Management/Social Worker. Management plans discussed with the patient, family and they are in agreement.  CODE STATUS: Full Code  DVT Prophylaxis: SCDs  TOTAL TIME TAKING CARE OF THIS PATIENT: 35 minutes.   POSSIBLE D/C IN 2 to 3 DAYS, DEPENDING ON CLINICAL CONDITION.  Saundra Shelling M.D on 09/12/2017 at 4:11 PM  Between 7am to 6pm - Pager - [phone]  After 6pm go to [email or web address] - password [email or web address] [facility]  Office  [phone]  CC: Primary care physician; [name], MD  Note: This dictation was prepared with Dragon dictation along with smaller phrase technology. Any transcriptional errors that result from this process are unintentional.

## 2017-09-12 NOTE — Progress Notes (Signed)
*  PRELIMINARY RESULTS* Echocardiogram 2D Echocardiogram has been performed.  Gregory Tyler 09/12/2017, 1:57 PM

## 2017-09-12 NOTE — Progress Notes (Signed)
Cardiovascular and Pulmonary Nurse Navigator Note:  77 year old gentleman with hx of CM, P-Afib, diastolic and systolic CHF, DM, HTN, who presented to the Cardiology Clinc with worsening leg swelling and scrotal edema.    Patient reports missing many doses of Lasix at home as he is the primary caregiver for his wife.  Patient also reports having difficulty paying for his wife's medications (#13) as well as his own.  Care Management Consultation placed.      Dietitian Consultation placed.    CHF Education:?? Educational session with patient completed.  Provided patient with "Living Better with Heart Failure" packet. Briefly reviewed definition of heart failure and signs and symptoms of an exacerbation.?Explained to patient that HF is a chronic illness which requires self-assessment / self-management along with help from the cardiologist/PCP.?? *Reviewed importance of and reason behind checking weight daily in the AM, after using the bathroom, but before getting dressed.  Encouraged patient to start weighing himself again and explained the importance of doing so. ?Patient needs scales. Patient stated he does not have scales and cannot afford to purchase them this month.  Patient further stated that he might be able to purchase them next month, but is not certain.    Reviewed the following information with patient:  *Discussed when to call the Dr= weight gain of >2-3lb overnight of 5lb in a week,  *Discussed yellow zone= call MD: weight gain of >2-3lb overnight of 5lb in a week, increased swelling, increased SOB when lying down, chest discomfort, dizziness, increased fatigue *Red Zone= call 911: struggle to breath, fainting or near fainting, significant chest pain   *Reviewed low sodium diet-provided handout of recommended and not recommended foods.??Reviewed reading labels with patient. Discussed fluid intake with patient as well. Patient not currently on a fluid restriction, but advised no more than  8-8 ounces glass of fluids per day.?Dietitian Consultation entered.    *Instructed patient to take medications as prescribed for heart failure. Explained briefly why pt is on the medications (either make you feel better, live longer or keep you out of the hospital) and discussed monitoring and side effects. Patient informed this RN he does not have enough money to purchase his wife's medications at times and as a result he does not take his medications. Patient stated he makes sure his wife has her medications.  Patient also reported that his wife's social security check each month does not even cover all of her medications.  CM RN notified of these concerns.  This RN stressed the importance of patient taking lasix and other medications in order to prevent fluid retention, CHF exacerbation, and hospitalization.    *Smoking Cessation?- Patient is a former smoker.?  *Discussed exercise.?Patient describes himself as being active, as he cares for his wife 24/7, who has Parkinson's.  Patient ambulates with a cane. Patient encouraged to remain as active as possible.    [place] [facility] - Patient reports having been seen in the [facility] in the past.  Patient asked this RN if I felt if he should be followed there in addition to Dr. Donivan Scull office.  This RN replied, "Yes."  The role of the Heart Failure Clinic was explained.  Patient has an appointment in the [facility] on 09/18/2017 at 9:40 a.m.    Additional appointments: Coumadin Clinic - 09/25/2017 @ 1:30 p.m. Dr. Rockey Situ - 10/03/2017 @ 10:40 a.m.    Once again, the 5 Steps to Living Better with Heart Failure were reviewed.  ?  Patient thanked me for providing the above information. ?   Roanna Epley, RN, BSN, [facility]? [place] [facility] [facility]  Cardiovascular &?Pulmonary Nurse Navigator  Direct Line: [phone]  Department Phone #: [phone] Fax: [phone]? Email Address: [email or web address]?

## 2017-09-12 NOTE — Consult Note (Signed)
Cardiology Consult    Patient ID: Gregory Tyler MRN: [id number], DOB/AGE: Aug 05, 1940   Admit date: 09/11/2017 Date of Consult: 09/12/2017  Primary Physician: [name], MD Primary Cardiologist: Ida Rogue, MD Requesting Provider: Earnest Bailey, MD  Patient Profile    Gregory Tyler is a 77 y.o. male with a history of PAF, HTN, HL, combined CHF, cardiomyopathy, DM, HL, prostate CA, CKD III, and chronic pelvic pain who is being seen today for the evaluation of CHF at the request of Dr. Doy Hutching.  Past Medical History   Past Medical History:  Diagnosis Date  . Anemia   . Anxiety   . Arthritis   . Cardiomyopathy ([place])    a. 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  Marland Kitchen Chronic combined systolic (congestive) and diastolic (congestive) heart failure ([place])    a. 05/2015 TEE: EF 35-40% (in setting of Afib).  . Chronic Pelvic pain   . Coronary artery disease    a. 05/2015 CTA Chest: 3 vessel coronary Ca2+.  . Depression   . Diabetes mellitus without complication ([place])   . Emphysema lung ([place])   . Gastric ulcer   . Gout   . Hyperlipidemia   . Hypertension   . Nephrolithiasis   . PAF (paroxysmal atrial fibrillation) ([place])    a. 05/2015 s/p DCCV; b. 03/2016 s/p DCCV; c. CHA2DS2VASc = 6 (coumadin).  . Prostate cancer ([place])   . Stage III chronic kidney disease ([place])   . Weight loss    50 lb since Apr 24, 2015    Past Surgical History:  Procedure Laterality Date  . APPENDECTOMY    . BACK SURGERY    . CHOLECYSTECTOMY    . ELECTROPHYSIOLOGIC STUDY N/A 06/17/2015   Procedure: CARDIOVERSION;  Surgeon: Wellington Hampshire, MD;  Location: [facility] ORS;  Service: Cardiovascular;  Laterality: N/A;  . ELECTROPHYSIOLOGIC STUDY N/A 03/30/2016   Procedure: CARDIOVERSION;  Surgeon: Minna Merritts, MD;  Location: [facility] ORS;  Service: Cardiovascular;  Laterality: N/A;  . GASTRIC BYPASS    . HEMORRHOID SURGERY    . pilonidal cyst    . TEE WITHOUT CARDIOVERSION N/A 06/17/2015    Procedure: TRANSESOPHAGEAL ECHOCARDIOGRAM (TEE);  Surgeon: Wellington Hampshire, MD;  Location: [facility] ORS;  Service: Cardiovascular;  Laterality: N/A;     Allergies  Allergies  Allergen Reactions  . Aspirin     GI Bleeding  . Codeine Other (See Comments)    BLISTERS Other reaction(s): Other (See Comments) BLISTERS   . Codeine Sulfate Other (See Comments)    Other reaction(s): Other (See Comments) BLISTERS BLISTERS    History of Present Illness    77 year old male with the above complex past medical history including paroxysmal atrial fibrillation status post cardioversions in February 2017 and December 2017.  He is on chronic Coumadin.  Other history includes chronic combined systolic and diastolic congestive heart failure with an EF of 35 to 40% by transesophageal echocardiogram in February 2017, hypertension, hyperlipidemia, diabetes, CKD III, anemia, arthritis, depression, gout, prostate cancer, and chronic pelvic pain.  He was last seen in clinic by Dr. Rockey Situ on May 13 with complaints of significant lower extremity and scrotal swelling in the setting of noncompliance with Lasix.  Patient lives with his disabled wife in [place] and is her primary caregiver.  In that setting, he will frequently skip doses of Lasix because it disrupts his day.  As a result, he has had significant scrotal and lower extremity  edema.  He also has chronic dyspnea on exertion with minimal activity.  He was seen in clinic on May 13 and was noted to be volume overloaded.  Initial recommendation was for hospitalization however, patient preferred to go home and take his Lasix.  He was advised to double up his dose, with plan for early follow-up.  He says that he frequently missed the p.m. dose of Lasix and continued to have significant dyspnea, and lower extremity/scrotal edema.  He was seen in Coumadin clinic yesterday and was noted to be dyspneic and volume overloaded and referred to the emergency department for  admission.  In the ER, he was in sinus rhythm.  Chest x-ray showed no acute cardiopulmonary disease.  His BNP was elevated at 1046.  Troponin was minimally elevated at 0.033.  He was placed on IV Lasix and admitted for further evaluation.  He feels as though his lower extremity swelling is down this morning.  He is minus 950 mL's and down 3 pounds overnight.  Inpatient Medications    . amiodarone  200 mg Oral Daily  . atorvastatin  20 mg Oral Daily  . furosemide  40 mg Intravenous BID  . insulin aspart  0-9 Units Subcutaneous TID WC  . insulin glargine  7 Units Subcutaneous Daily  . metoprolol tartrate  50 mg Oral BID  . mometasone-formoterol  2 puff Inhalation BID  . sodium chloride flush  3 mL Intravenous Q12H  . warfarin  5 mg Oral q1800  . Warfarin - Pharmacist Dosing Inpatient   Does not apply q1800    Family History    Family History  Problem Relation Age of Onset  . CAD Mother   . Stroke Mother   . Diabetes Other    indicated that his mother is deceased. He indicated that his father is deceased. He indicated that the status of his other is unknown.   Social History    Social History   Socioeconomic History  . Marital status: Married    Spouse name: Not on file  . Number of children: Not on file  . Years of education: Not on file  . Highest education level: Not on file  Occupational History  . Not on file  Social Needs  . Financial resource strain: Not on file  . Food insecurity:    Worry: Not on file    Inability: Not on file  . Transportation needs:    Medical: Not on file    Non-medical: Not on file  Tobacco Use  . Smoking status: Former Research scientist (life sciences)  . Smokeless tobacco: Former Network engineer and Sexual Activity  . Alcohol use: No  . Drug use: No  . Sexual activity: Not on file  Lifestyle  . Physical activity:    Days per week: Not on file    Minutes per session: Not on file  . Stress: Not on file  Relationships  . Social connections:    Talks on  phone: Not on file    Gets together: Not on file    Attends religious service: Not on file    Active member of club or organization: Not on file    Attends meetings of clubs or organizations: Not on file    Relationship status: Not on file  . Intimate partner violence:    Fear of current or ex partner: Not on file    Emotionally abused: Not on file    Physically abused: Not on file    Forced sexual  activity: Not on file  Other Topics Concern  . Not on file  Social History Narrative   Retired.  Lives in [place].  Primary caregiver for his wife who is disabled.     Review of Systems    General:  No chills, fever, night sweats or weight changes.  Cardiovascular:  occas chest pain - generally @ rest, few x/wk, lasts a few mins and gets better with tapping his chest/epigastric area.  He thinks it's indigestion.  +++ dyspnea on exertion, +++ lower ext and scrotal edema, +++ orthopnea, no palpitations, paroxysmal nocturnal dyspnea. Dermatological: No rash, lesions/masses Respiratory: No cough, +++ dyspnea Urologic: No hematuria, dysuria Abdominal:   No nausea, vomiting, diarrhea, bright red blood per rectum, melena, or hematemesis. Chronic, intermittent lower abd/pelvix pain. Neurologic:  No visual changes, wkns, changes in mental status. All other systems reviewed and are otherwise negative except as noted above.  Physical Exam    Blood pressure 124/67, pulse (!) 45, temperature 98.2 F (36.8 C), temperature source Oral, resp. rate 18, height 5\' 8"  (1.727 m), weight 161 lb 11.2 oz (73.3 kg), SpO2 97 %.  General: Pleasant, NAD Psych: flat affect. Neuro: Alert and oriented X 3. Moves all extremities spontaneously. HEENT: Normal  Neck: Supple without bruits. JVP mod elevated. Lungs:  Resp regular and unlabored, CTA. Heart: RRR, distant, no s3, s4, or murmurs. Abdomen: Soft, non-distended, diffusely tender, BS + x 4.  Extremities: No clubbing, cyanosis.  Scrotal and 1+ bilat LE edema.  DP/PT/Radials 1+ and equal bilaterally.  Labs     Recent Labs    09/11/17 1351 09/11/17 1952 09/12/17 0143  TROPONINI 0.03* 0.03* 0.03*   Lab Results  Component Value Date   WBC 5.4 09/12/2017   HGB 10.4 (L) 09/12/2017   HCT 31.7 (L) 09/12/2017   MCV 85.6 09/12/2017   PLT 225 09/12/2017    Recent Labs  Lab 09/11/17 1351 09/12/17 0143  NA 139 140  K 4.6 4.0  CL 111 110  CO2 22 25  BUN 28* 31*  CREATININE 1.55* 1.43*  CALCIUM 8.4* 8.0*  PROT 6.2*  --   BILITOT 0.3  --   ALKPHOS 93  --   ALT 20  --   AST 27  --   GLUCOSE 138* 173*    Lab Results  Component Value Date   INR 2.31 09/12/2017   INR 2.5 09/11/2017   INR 6.2 09/04/2017    Radiology Studies    Dg Chest 2 View  Result Date: 09/11/2017 CLINICAL DATA:  Cough. EXAM: CHEST - 2 VIEW COMPARISON:  Chest x-ray dated 06/10/2015 FINDINGS: The heart size and mediastinal contours are within normal limits. Both lungs are clear. The visualized skeletal structures are unremarkable. IMPRESSION: No active cardiopulmonary disease. Electronically Signed   By: Lorriane Shire M.D.   On: 09/11/2017 14:38    ECG & Cardiac Imaging    Sinus bradyc, 54, LAD, LAFB, IVCD, lat twi, prolonged QT  Assessment & Plan    1.  Acute on chronic combined systolic and diastolic congestive heart failure: Patient with previously known EF of 35 to 40% in the setting of atrial fibrillation in February 2017.  He is on chronic Lasix therapy and is often noncompliant secondary to disruption in his day related to polyuria.  He says when he takes Lasix, he frequently just dribbles all day long.  He was seen in clinic approximately 10 days ago and was advised to increase his Lasix.  When seen in  Coumadin clinic yesterday, he remained volume overloaded.  He also has chronic dyspnea on exertion with minimal activity.  As result, he was referred to the emergency department.  There, BNP was approximately 1000.  Chest x-ray without acute cardiopulmonary  disease.  Troponin minimally elevated with flat trend.  He has responded well to intravenous Lasix and is -950 mL's since admission.  He continues to have volume overload with JVD, scrotal, and lower extremity edema.  Continue IV diuresis.  Follow-up echo as EF has not been reevaluated since his atrial fibrillation episode in February 2017.  Continue beta-blocker.  Not previously on ACE inhibitor/ARB, presumably secondary to chronic kidney disease.  Blood pressure stable.  2.  Elevated troponin: Minimal troponin elevation of 0.033.  He has a history of coronary calcification noted on CT in 2017 but no previous formal ischemic evaluation.  Follow-up echocardiogram and consider ischemic evaluation for LV dysfunction.  He does report intermittent chest discomfort but this is frequently at rest and short-lived.  He says it is more like indigestion.  Continue beta-blocker.  He has a prior intolerance to aspirin secondary to GI bleeding.  Continue statin therapy.  3.  Paroxysmal atrial fibrillation: He is on chronic amiodarone and Coumadin.  4.  Essential hypertension: Stable.  INR therapeutic.  5.  Hyperlipidemia: Continue statin.  Follow-up fasting lipids in the a.m. Nl LFTs.  6.  Chronic pelvic pain: Previously evaluated by urology.  Consider imaging.  Defer to primary team.  7.  Normocytic anemia: Stable.  8.  Stage III chronic kidney disease: Stable.  Follow creatinine with diuresis.  Signed, Murray Hodgkins, NP 09/12/2017, 8:35 AM  For questions or updates, please contact   Please consult [email or web address] for contact info under Cardiology/STEMI.

## 2017-09-12 NOTE — Progress Notes (Signed)
Dietary:  Patient may have diet coke.  Thank you.  Phillis Knack, RN

## 2017-09-12 NOTE — Progress Notes (Signed)
ANTICOAGULATION CONSULT NOTE - Initial Consult  Pharmacy Consult for warfarin Indication: atrial fibrillation  Allergies  Allergen Reactions  . Aspirin     GI Bleeding  . Codeine Other (See Comments)    BLISTERS Other reaction(s): Other (See Comments) BLISTERS   . Codeine Sulfate Other (See Comments)    Other reaction(s): Other (See Comments) BLISTERS BLISTERS    Patient Measurements: Height: 5\' 8"  (172.7 cm) Weight: 161 lb 11.2 oz (73.3 kg) IBW/kg (Calculated) : 68.4  Vital Signs: Temp: 98.1 F (36.7 C) (05/23 0540) Temp Source: Oral (05/22 2009) BP: 112/59 (05/23 0540) Pulse Rate: 45 (05/23 0540)  Labs: Recent Labs    09/11/17 1303 09/11/17 1351 09/11/17 1952 09/12/17 0143  HGB  --  11.5*  --  10.4*  HCT  --  35.3*  --  31.7*  PLT  --  258  --  225  LABPROT  --   --   --  25.2*  INR 2.5  --   --  2.31  CREATININE  --  1.55*  --  1.43*  TROPONINI  --  0.03* 0.03* 0.03*    Estimated Creatinine Clearance: 41.9 mL/min (A) (by C-G formula based on SCr of 1.43 mg/dL (H)).   Medical History: Past Medical History:  Diagnosis Date  . Anemia   . Anxiety   . Arthritis   . CHF (congestive heart failure) ([facility])    Combined Systolic and Diastolic; EF 65-68% in 03/7516  . Chronic atrial fibrillation ([facility])    a. on Xarelto  . Coronary artery disease   . Depression   . Diabetes mellitus without complication ([place])   . Dysrhythmia   . Emphysema lung ([place])   . Gastric ulcer   . Gout   . Hyperlipidemia   . Hypertension   . Nephrolithiasis   . Prostate cancer ([place])   . Shortness of breath dyspnea   . Weight loss    50 lb since Apr 24, 2015    Assessment: 77 year old male requiring warfarin therapy for afib. Patient presents therapeutic on warfarin with INR 2.5 on admission. Patient's home dose is warfarin 5mg  po daily.   Patient has had some supratherapeutic INRs in recent stays. Drug interaction with amiodarone which he is also on at  home.  DATE INR DOSE 5/22 2.5 5mg   5/23 2.31   Goal of Therapy:  INR 2-3 Monitor platelets by anticoagulation protocol: Yes   Plan:  Continue Warfarin 5mg  po daily. Will check INR with AM labs. Pharmacist to continue to follow per consult.   Pernell Dupre, PharmD, BCPS Clinical Pharmacist 09/12/2017 7:33 AM

## 2017-09-13 ENCOUNTER — Telehealth: Payer: Self-pay | Admitting: *Deleted

## 2017-09-13 DIAGNOSIS — I5033 Acute on chronic diastolic (congestive) heart failure: Secondary | ICD-10-CM

## 2017-09-13 DIAGNOSIS — R1031 Right lower quadrant pain: Secondary | ICD-10-CM

## 2017-09-13 DIAGNOSIS — E440 Moderate protein-calorie malnutrition: Secondary | ICD-10-CM

## 2017-09-13 DIAGNOSIS — I480 Paroxysmal atrial fibrillation: Secondary | ICD-10-CM

## 2017-09-13 LAB — GLUCOSE, CAPILLARY
GLUCOSE-CAPILLARY: 115 mg/dL — AB (ref 65–99)
GLUCOSE-CAPILLARY: 209 mg/dL — AB (ref 65–99)
GLUCOSE-CAPILLARY: 220 mg/dL — AB (ref 65–99)
Glucose-Capillary: 100 mg/dL — ABNORMAL HIGH (ref 65–99)
Glucose-Capillary: 204 mg/dL — ABNORMAL HIGH (ref 65–99)
Glucose-Capillary: 118 mg/dL — ABNORMAL HIGH (ref 65–99)

## 2017-09-13 LAB — BASIC METABOLIC PANEL
ANION GAP: 8 (ref 5–15)
BUN: 31 mg/dL — ABNORMAL HIGH (ref 6–20)
CO2: 26 mmol/L (ref 22–32)
CREATININE: 1.49 mg/dL — AB (ref 0.61–1.24)
Calcium: 8.2 mg/dL — ABNORMAL LOW (ref 8.9–10.3)
Chloride: 105 mmol/L (ref 101–111)
GFR, EST AFRICAN AMERICAN: 50 mL/min — AB (ref >60)
GFR, EST NON AFRICAN AMERICAN: 44 mL/min — AB (ref >60)
Glucose, Bld: 114 mg/dL — ABNORMAL HIGH (ref 65–99)
Potassium: 4.1 mmol/L (ref 3.5–5.1)
SODIUM: 139 mmol/L (ref 135–145)

## 2017-09-13 LAB — LIPID PANEL
Cholesterol: 92 mg/dL (ref 0–200)
HDL: 44 mg/dL (ref >40)
LDL Cholesterol: 37 mg/dL (ref 0–99)
Total CHOL/HDL Ratio: 2.1 RATIO
Triglycerides: 57 mg/dL (ref <150)
VLDL: 11 mg/dL (ref 0–40)

## 2017-09-13 LAB — PROTIME-INR
INR: 2.63
Prothrombin Time: 27.9 seconds — ABNORMAL HIGH (ref 11.4–15.2)

## 2017-09-13 LAB — ECHOCARDIOGRAM COMPLETE
HEIGHTINCHES: 68 in
WEIGHTICAEL: 2587.2 [oz_av]

## 2017-09-13 MED ORDER — ADULT MULTIVITAMIN W/MINERALS CH
1.0000 | ORAL_TABLET | Freq: Every day | ORAL | Status: DC
  Administered 2017-09-13 – 2017-09-15 (×3): 1 via ORAL
  Filled 2017-09-13 (×2): qty 1

## 2017-09-13 MED ORDER — AMMONIUM LACTATE 12 % EX LOTN
TOPICAL_LOTION | Freq: Two times a day (BID) | CUTANEOUS | Status: DC
  Administered 2017-09-13 – 2017-09-15 (×5): via TOPICAL
  Filled 2017-09-13: qty 400

## 2017-09-13 MED ORDER — ENSURE ENLIVE PO LIQD
237.0000 mL | Freq: Two times a day (BID) | ORAL | Status: DC
  Administered 2017-09-13 – 2017-09-15 (×4): 237 mL via ORAL

## 2017-09-13 MED ORDER — FUROSEMIDE 20 MG PO TABS
20.0000 mg | ORAL_TABLET | Freq: Every day | ORAL | Status: DC
  Administered 2017-09-14 – 2017-09-15 (×2): 20 mg via ORAL
  Filled 2017-09-13 (×2): qty 1

## 2017-09-13 MED ORDER — METOPROLOL SUCCINATE ER 25 MG PO TB24
12.5000 mg | ORAL_TABLET | Freq: Every day | ORAL | Status: DC
  Administered 2017-09-13 – 2017-09-14 (×2): 12.5 mg via ORAL
  Filled 2017-09-13 (×2): qty 1

## 2017-09-13 MED ORDER — GABAPENTIN 100 MG PO CAPS
100.0000 mg | ORAL_CAPSULE | Freq: Every day | ORAL | Status: DC
  Administered 2017-09-13 – 2017-09-14 (×2): 100 mg via ORAL
  Filled 2017-09-13 (×2): qty 1

## 2017-09-13 NOTE — Telephone Encounter (Signed)
-----   Message from Blain Pais sent at 09/13/2017 12:50 PM EDT ----- Regarding: tcm/ph 6/13 10:40 Dr. Rockey Situ

## 2017-09-13 NOTE — Care Management (Signed)
Patient admitted for CHF.  Patient lives at home with his wife who he is the primary care giver for.  RNCM consult for medications needs.  RNCM confirmed with [facility] on [address] that patient does have Medicare part D (Patient states, "I also have a silvers[employer] plan").  RNCM confirmed that patient picked up all of his medications last month.  Pharmacy provided a date that patient picked up his medications, and price that he paid.  When RNCM discussed medication needs with patient, he denied any issues obtaining his medication.  Patient states "I used to have trouble getting my medication, but I paid my rent off last month and now I have money to get them."  Patient clarifies and states that he paid his last house payment last month.  Patient also denies issues obtaining his wife's medications.  Patient states "they used to cost to much when they wrote them for 3 months, but now that they write them for one month at a time I don't have any problem".  Patient states that he lives 2 miles from the pharmacy and denies issues getting there.  Patient does state that he only takes his lasix in the morning, not as prescribed.  Patient states " I have plenty of the pills at the house, but if I take them in the evening I pee all the time and can't take care of my wife"  Patient states that his wife does have Home health services coming out to the home, but does not recall the name of the agency.  RNCM discussed with the patient setting up home health for him, for medication management.  Patient declines for home health services to be arranged at discharge.    Patients states that he has a cane, RW, bsc in the home if needed.  Patient does not have access to scales. Set of scales provided to patient.   RNCM was notified that there was potential concerns that patient may have limited income to purchase food.  Patient was provided information on Me[employer] on Whe[employer], and local food pantries.  Patient states "I  have plenty of food at home.  I do not need that information, someone from [employer] Called me last week and its not something I need".  RNCM confirmed all areas of concerns were addressed, and patient either denied issues/declined additional services.  Bedside RN, and Cardiovascular &?Pulmonary Nurse Navigator updated.

## 2017-09-13 NOTE — Care Management Important Message (Signed)
Copy of signed IM left in patient's room.    

## 2017-09-13 NOTE — Progress Notes (Signed)
Patient ID: Gregory Tyler, male   DOB: 05-Dec-1940, 77 y.o.   MRN: [id number]  [facility] PROGRESS NOTE  Gregory Tyler FIE:[id number] DOB: 05-20-1940 DOA: 09/11/2017 PCP: [name], MD  HPI/Subjective: Patient having some right groin pain.  Urinating quite a bit.  Feels  weak.    Objective: Vitals:   09/13/17 0358 09/13/17 0750  BP: (!) 113/57 119/60  Pulse: (!) 47 (!) 48  Resp: 18 16  Temp: 98.8 F (37.1 C) 98.4 F (36.9 C)  SpO2: 97% 95%    Filed Weights   09/11/17 2009 09/12/17 0540 09/13/17 0358  Weight: 74.6 kg (164 lb 6.4 oz) 73.3 kg (161 lb 11.2 oz) 70.9 kg (156 lb 6.4 oz)    ROS: Review of Systems  Constitutional: Negative for chills and fever.  Eyes: Negative for blurred vision.  Respiratory: Negative for cough and shortness of breath.   Cardiovascular: Negative for chest pain.  Gastrointestinal: Positive for abdominal pain. Negative for constipation, diarrhea, nausea and vomiting.  Genitourinary: Negative for dysuria.  Musculoskeletal: Negative for joint pain.  Neurological: Negative for dizziness and headaches.   Exam: Physical Exam  Constitutional: He is oriented to person, place, and time.  HENT:  Nose: No mucosal edema.  Mouth/Throat: No oropharyngeal exudate or posterior oropharyngeal edema.  Eyes: Pupils are equal, round, and reactive to light. Conjunctivae, EOM and lids are normal.  Neck: No JVD present. Carotid bruit is not present. No edema present. No thyroid mass and no thyromegaly present.  Cardiovascular: S1 normal and S2 normal. Exam reveals no gallop.  No murmur heard. Pulses:      Dorsalis pedis pulses are 2+ on the right side, and 2+ on the left side.  Respiratory: No respiratory distress. He has no wheezes. He has no rhonchi. He has no rales.  GI: Soft. Bowel sounds are normal. There is no tenderness.  Musculoskeletal:       Right ankle: He exhibits swelling.       Left ankle: He exhibits swelling.  Lymphadenopathy:     He has no cervical adenopathy.  Neurological: He is alert and oriented to person, place, and time. No cranial nerve deficit.  Skin: Skin is warm. No rash noted. Nails show no clubbing.  Psychiatric: He has a normal mood and affect.      Data Reviewed: Basic Metabolic Panel: Recent Labs  Lab 09/11/17 1351 09/12/17 0143 09/13/17 0349  NA 139 140 139  K 4.6 4.0 4.1  CL 111 110 105  CO2 22 25 26   GLUCOSE 138* 173* 114*  BUN 28* 31* 31*  CREATININE 1.55* 1.43* 1.49*  CALCIUM 8.4* 8.0* 8.2*   Liver Function Tests: Recent Labs  Lab 09/11/17 1351  AST 27  ALT 20  ALKPHOS 93  BILITOT 0.3  PROT 6.2*  ALBUMIN 3.1*   No results for input(s): LIPASE, AMYLASE in the last 168 hours. No results for input(s): AMMONIA in the last 168 hours. CBC: Recent Labs  Lab 09/11/17 1351 09/12/17 0143  WBC 6.2 5.4  NEUTROABS 4.5  --   HGB 11.5* 10.4*  HCT 35.3* 31.7*  MCV 86.7 85.6  PLT 258 225   Cardiac Enzymes: Recent Labs  Lab 09/11/17 1351 09/11/17 1952 09/12/17 0143 09/12/17 0804  TROPONINI 0.03* 0.03* 0.03* 0.03*   BNP (last 3 results) Recent Labs    09/11/17 1352  BNP 1,046.0*    ProBNP (last 3 results) No results for input(s): PROBNP in the last 8760 hours.  CBG: Recent  Labs  Lab 09/12/17 0739 09/12/17 1132 09/12/17 1648 09/12/17 2030 09/13/17 1136  GLUCAP 115* 155* 135* 209* 204*    No results found for this or any previous visit (from the past 240 hour(s)).   Studies: No results found.  Scheduled Meds: . amiodarone  200 mg Oral Daily  . ammonium lactate   Topical BID  . atorvastatin  20 mg Oral Daily  . feeding supplement (ENSURE ENLIVE)  237 mL Oral BID BM  . furosemide  40 mg Intravenous BID  . gabapentin  100 mg Oral QHS  . insulin aspart  0-9 Units Subcutaneous TID WC  . insulin glargine  7 Units Subcutaneous Daily  . lisinopril  5 mg Oral Daily  . metoprolol succinate  12.5 mg Oral Daily  . mometasone-formoterol  2 puff Inhalation  BID  . multivitamin with minerals  1 tablet Oral Daily  . sodium chloride flush  3 mL Intravenous Q12H  . warfarin  5 mg Oral q1800  . Warfarin - Pharmacist Dosing Inpatient   Does not apply q1800   Continuous Infusions: . sodium chloride      Assessment/Plan:  1. Acute on chronic diastolic congestive heart failure.  Patient on metoprolol, lisinopril and IV Lasix. 2. Chronic atrial fibrillation on amiodarone, metoprolol and warfarin.  INR therapeutic 3. COPD.  Respiratory status stable. 4. Right groin pain he said ER physician reduced her hernia but he still having pain.  I was unable to palpate a hernia.  I asked him if he wanted me to get a surgeon to see him.  He said he would rather hold off. 5. Hyperlipidemia unspecified on atorvastatin 6. Type 2 diabetes mellitus on sliding scale and Lantus  Code Status:     Code Status Orders  (From admission, onward)        Start     Ordered   09/11/17 1952  Full code  Continuous     09/11/17 1951    Code Status History    Date Active Date Inactive Code Status Order ID Comments User Context   05/11/2015 1307 05/13/2015 1256 Full Code [id number]  Aldean Jewett, MD Inpatient   04/25/2015 0149 04/27/2015 1741 Full Code [id number]  Hillary Bow, MD ED      Disposition Plan: Potentially home tomorrow  Consultants:  Cardiology  Time spent: 28 minutes  Gregory Tyler

## 2017-09-13 NOTE — Progress Notes (Addendum)
Progress Note  Patient Name: Gregory Tyler Date of Encounter: 09/13/2017  Primary Cardiologist: Ida Rogue, MD   Subjective   Numerous complaints this morning Feet burn, tingle Scrotal pain, right inguinal area Leg swelling dramatically improved  Echocardiogram with normal ejection minimally elevated right heart pressures  Does not think he can go home today,  Does not have anyone to watch his  Wife tomorrow night so will need to go home tomorrow  Inpatient Medications    Scheduled Meds: . amiodarone  200 mg Oral Daily  . atorvastatin  20 mg Oral Daily  . furosemide  40 mg Intravenous BID  . insulin aspart  0-9 Units Subcutaneous TID WC  . insulin glargine  7 Units Subcutaneous Daily  . lisinopril  5 mg Oral Daily  . metoprolol succinate  12.5 mg Oral Daily  . mometasone-formoterol  2 puff Inhalation BID  . sodium chloride flush  3 mL Intravenous Q12H  . warfarin  5 mg Oral q1800  . Warfarin - Pharmacist Dosing Inpatient   Does not apply q1800   Continuous Infusions: . sodium chloride     PRN Meds: sodium chloride, acetaminophen **OR** acetaminophen, HYDROcodone-acetaminophen, ondansetron **OR** ondansetron (ZOFRAN) IV, polyethylene glycol, sodium chloride, sodium chloride flush   Vital Signs    Vitals:   09/12/17 1650 09/12/17 1919 09/13/17 0358 09/13/17 0750  BP: 132/66 (!) 101/56 (!) 113/57 119/60  Pulse: (!) 49 (!) 51 (!) 47 (!) 48  Resp: 18 18 18 16   Temp: 98.2 F (36.8 C) 98.4 F (36.9 C) 98.8 F (37.1 C) 98.4 F (36.9 C)  TempSrc: Oral Oral Oral Oral  SpO2: 98% 98% 97% 95%  Weight:   156 lb 6.4 oz (70.9 kg)   Height:        Intake/Output Summary (Last 24 hours) at 09/13/2017 1059 Last data filed at 09/13/2017 0942 Gross per 24 hour  Intake 240 ml  Output 3325 ml  Net -3085 ml   Filed Weights   09/11/17 2009 09/12/17 0540 09/13/17 0358  Weight: 164 lb 6.4 oz (74.6 kg) 161 lb 11.2 oz (73.3 kg) 156 lb 6.4 oz (70.9 kg)    Telemetry    NSR - Personally Reviewed  ECG     - Personally Reviewed  Physical Exam   GEN: No acute distress.   Neck: No JVD Cardiac: RRR, bradycardic, no murmurs, rubs, or gallops.  Respiratory: Clear to auscultation bilaterally. GI: Soft, nontender, non-distended  MS: No edema; No deformity. Neuro:  Nonfocal  Psych: Normal affect   Labs    Chemistry Recent Labs  Lab 09/11/17 1351 09/12/17 0143 09/13/17 0349  NA 139 140 139  K 4.6 4.0 4.1  CL 111 110 105  CO2 22 25 26   GLUCOSE 138* 173* 114*  BUN 28* 31* 31*  CREATININE 1.55* 1.43* 1.49*  CALCIUM 8.4* 8.0* 8.2*  PROT 6.2*  --   --   ALBUMIN 3.1*  --   --   AST 27  --   --   ALT 20  --   --   ALKPHOS 93  --   --   BILITOT 0.3  --   --   GFRNONAA 41* 46* 44*  GFRAA 48* 53* 50*  ANIONGAP 6 5 8      Hematology Recent Labs  Lab 09/11/17 1351 09/12/17 0143  WBC 6.2 5.4  RBC 4.07* 3.71*  HGB 11.5* 10.4*  HCT 35.3* 31.7*  MCV 86.7 85.6  MCH 28.3 28.1  MCHC 32.6  32.9  RDW 15.8* 15.8*  PLT 258 225    Cardiac Enzymes Recent Labs  Lab 09/11/17 1351 09/11/17 1952 09/12/17 0143 09/12/17 0804  TROPONINI 0.03* 0.03* 0.03* 0.03*   No results for input(s): TROPIPOC in the last 168 hours.   BNP Recent Labs  Lab 09/11/17 1352  BNP 1,046.0*     DDimer No results for input(s): DDIMER in the last 168 hours.   Radiology    Dg Chest 2 View  Result Date: 09/11/2017 CLINICAL DATA:  Cough. EXAM: CHEST - 2 VIEW COMPARISON:  Chest x-ray dated 06/10/2015 FINDINGS: The heart size and mediastinal contours are within normal limits. Both lungs are clear. The visualized skeletal structures are unremarkable. IMPRESSION: No active cardiopulmonary disease. Electronically Signed   By: Lorriane Shire M.D.   On: 09/11/2017 14:38    Cardiac Studies     Patient Profile     77 year old gentleman known to me from cardiology clinic with history of cardiomyopathy, paroxysmal atrial fibrillation, diastolic and systolic CHF ,  diabetes, hypertension , who presents to the hospital from cardiology clinic with worsening leg swelling, scrotal edema  Assessment & Plan    A/P  acute on chronic diastolic and systolic CHF Previous ejection fraction 35%, in 2017 in the setting of atrial fibrillation New echocardiogram this admission normal ejection fraction only with now mildly elevated right heart pressures Medication noncompliance at home with his Lasix Would continue low-dose lisinopril low-dose metoprolol succinate  12.5 daily Could switch from IV Lasix to oral dosing 20 daily Stressed importance of compliance of his diuretic  Paroxysmal atrial fibrillation Continue amiodarone warfarin low-dose metoprolol  Chronic kidney disease Creatinine stable 1.4 Low-dose ACE inhibitor  DISPO: Social stressors Take care of wife with dementia He is having difficulty taking care of himself   Discussed with nursing, significant CHF education provided  Total encounter time more than 35 minutes  Greater than 50% was spent in counseling and coordination of care with the patient   For questions or updates, please contact [place] Please consult [email or web address] for contact info under Cardiology/STEMI.      Signed, Ida Rogue, MD  09/13/2017, 10:59 AM

## 2017-09-13 NOTE — Progress Notes (Addendum)
Initial Nutrition Assessment  DOCUMENTATION CODES:   Non-severe (moderate) malnutrition in context of chronic illness  INTERVENTION:   Pt at high risk for nutrient deficiencies r/t his gastric surgery. Recommend follow up with PCP to check thiamine, B12, folate, iron, calcium, zinc, copper, and vitamins D, A, E, & K labs as an outpatient.  Ensure Enlive po BID, each supplement provides 350 kcal and 20 grams of protein  MVI daily  Liberalize diet  NUTRITION DIAGNOSIS:   Moderate Malnutrition related to chronic illness(CHF, h/o gastric bypass ) as evidenced by moderate fat depletion, moderate muscle depletion.  GOAL:   Patient will meet greater than or equal to 90% of their needs  MONITOR:   PO intake, Supplement acceptance, Labs, Weight trends, Skin, I & O's  REASON FOR ASSESSMENT:   Consult Diet education  ASSESSMENT:   77 year old male patient with history of combined systolic and diastolic heart failure with EF of 35 to 40%, chronic atrial fibrillation on anticoagulation admitted under hospitalist service for fluid overload and shortness of breath.   Met with pt in room today. Pt reports good appetite pta and currently. Pt eating 100% of meals in hospital. Pt drank an strawberry Ensure with breakfast this morning. Pt lives at home with his wife whom has parkinson's dementia. Pt reports that he takes care of his wife and prepares all the meals at home. Pt reports that he does try to avoid cooking with salt but reports that he does use a lot of items from a can. Pt also reports difficulty getting access to foods sometimes and reports financial struggles. Pt and his wife do drink Ensure Light sometimes but not regularly. Pt reports difficulty chewing r/t he only has bottom teeth but he declines a soft diet today. Per chart, pt has lost 13lbs(8%) in the past year. Pt reports a history of gastric bypass surgery "many many years ago" but is unsure what type of surgery he had. Pt  reports a 150lb wt loss secondary to his surgery. Pt does not follow any particular diet at home and reports that he does not take any vitamins. Pt at high risk for nutrient deficiencies r/t his gastric surgery. Recommend follow up with PCP to check thiamine, B12, folate, iron, calcium, zinc, copper, and vitamins D, A, E, & K labs as an outpatient. Pt reports tingling/pain in his hands and feet r/t diabetic neuropathy; this could also be related to B12 deficiency. RD will order supplements and vitamins to help pt meet his estimated needs. Pt provided with low sodium diet education today.   Medications reviewed and include: lasix, insulin, coumadin  Labs reviewed: BUN 31(H), creat 1.49(H), Ca 8.2(L) BNP- 1046(H)- 5/22 cbgs- 203, 115, 155, 135, 209 x 24hrs AIC 6.8- 5/22  NUTRITION - FOCUSED PHYSICAL EXAM:    Most Recent Value  Orbital Region  Moderate depletion  Upper Arm Region  Moderate depletion  Thoracic and Lumbar Region  Moderate depletion  Buccal Region  Mild depletion  Temple Region  Moderate depletion  Clavicle Bone Region  Moderate depletion  Clavicle and Acromion Bone Region  Moderate depletion  Scapular Bone Region  Moderate depletion  Dorsal Hand  Mild depletion  Patellar Region  Moderate depletion  Anterior Thigh Region  Moderate depletion  Posterior Calf Region  Moderate depletion  Edema (RD Assessment)  Mild [BLE]  Hair  Reviewed  Eyes  Reviewed  Mouth  Reviewed  Skin  Reviewed  Nails  Reviewed     Diet Order:  Diet Order           Diet heart healthy/carb modified Room service appropriate? Yes; Fluid consistency: Thin  Diet effective now         EDUCATION NEEDS:   Education needs have been addressed  Skin:  Skin Assessment: Reviewed RN Assessment  Last BM:  5/23- type 6  Height:   Ht Readings from Last 1 Encounters:  09/11/17 5' 8"  (1.727 m)    Weight:   Wt Readings from Last 1 Encounters:  09/13/17 156 lb 6.4 oz (70.9 kg)    Ideal Body  Weight:  70 kg  BMI:  Body mass index is 23.78 kg/m.  Estimated Nutritional Needs:   Kcal:  1800-2100kcal/day   Protein:  92-106g/day   Fluid:  per MD  Koleen Distance MS, RD, LDN Pager #- [phone] Office#- [phone] After Hours Pager: [phone]

## 2017-09-13 NOTE — Plan of Care (Signed)
Nutrition Education Note  RD consulted for nutrition education regarding CHF.  RD provided "Low Sodium Nutrition Therapy" handout from the Academy of [employer]etics. Reviewed patient's dietary recall. Provided examples on ways to decrease sodium intake in diet. Discouraged intake of processed foods and use of salt shaker. Encouraged fresh fruits and vegetables as well as whole grain sources of carbohydrates to maximize fiber intake.   RD discussed why it is important for patient to adhere to diet recommendations, and emphasized the role of fluids, foods to avoid, and importance of weighing self daily. Teach back method used.  Expect fair compliance.  Body mass index is 23.78 kg/m. Pt meets criteria for normal weight based on current BMI.  Current diet order is HH/CHO, patient is consuming approximately 100% of meals at this time.   RD following this pt   Gregory Distance MS, RD, LDN Pager #[phone] Office#- [phone] After Hours Pager: [phone]

## 2017-09-13 NOTE — Telephone Encounter (Signed)
The patient is currently admitted.

## 2017-09-13 NOTE — Progress Notes (Signed)
ANTICOAGULATION CONSULT NOTE - Initial Consult  Pharmacy Consult for warfarin Indication: atrial fibrillation  Allergies  Allergen Reactions  . Aspirin     GI Bleeding  . Codeine Other (See Comments)    BLISTERS Other reaction(s): Other (See Comments) BLISTERS   . Codeine Sulfate Other (See Comments)    Other reaction(s): Other (See Comments) BLISTERS BLISTERS    Patient Measurements: Height: 5\' 8"  (172.7 cm) Weight: 156 lb 6.4 oz (70.9 kg) IBW/kg (Calculated) : 68.4  Vital Signs: Temp: 98.4 F (36.9 C) (05/24 0750) Temp Source: Oral (05/24 0750) BP: 119/60 (05/24 0750) Pulse Rate: 48 (05/24 0750)  Labs: Recent Labs    09/11/17 1303  09/11/17 1351 09/11/17 1952 09/12/17 0143 09/12/17 0804 09/13/17 0349  HGB  --   --  11.5*  --  10.4*  --   --   HCT  --   --  35.3*  --  31.7*  --   --   PLT  --   --  258  --  225  --   --   LABPROT  --   --   --   --  25.2*  --  27.9*  INR 2.5  --   --   --  2.31  --  2.63  CREATININE  --   --  1.55*  --  1.43*  --  1.49*  TROPONINI  --    < > 0.03* 0.03* 0.03* 0.03*  --    < > = values in this interval not displayed.    Estimated Creatinine Clearance: 40.2 mL/min (A) (by C-G formula based on SCr of 1.49 mg/dL (H)).   Medical History: Past Medical History:  Diagnosis Date  . Anemia   . Anxiety   . Arthritis   . Cardiomyopathy ([place])    a. 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  Marland Kitchen Chronic combined systolic (congestive) and diastolic (congestive) heart failure ([place])    a. 05/2015 TEE: EF 35-40% (in setting of Afib).  . Chronic Pelvic pain   . Coronary artery disease    a. 05/2015 CTA Chest: 3 vessel coronary Ca2+.  . Depression   . Diabetes mellitus without complication ([place])   . Emphysema lung ([name])   . Gastric ulcer   . Gout   . Hyperlipidemia   . Hypertension   . Nephrolithiasis   . PAF (paroxysmal atrial fibrillation) ([place])    a. 05/2015 s/p DCCV; b. 03/2016 s/p DCCV; c. CHA2DS2VASc = 6  (coumadin).  . Prostate cancer ([place])   . Stage III chronic kidney disease ([place])   . Weight loss    50 lb since Apr 24, 2015    Assessment: 77 year old male requiring warfarin therapy for afib. Patient presents therapeutic on warfarin with INR 2.5 on admission. Patient's home dose is warfarin 5mg  po daily.   Patient has had some supratherapeutic INRs in recent stays. Drug interaction with amiodarone which he is also on at home.  DATE INR DOSE 5/22 2.5 5mg   5/23 2.31 5mg  5/24 2.63   Goal of Therapy:  INR 2-3 Monitor platelets by anticoagulation protocol: Yes   Plan:  Continue Warfarin 5mg  po daily. Will check INR with AM labs since there was a >0.3 increase in INR. Pharmacist to continue to follow per consult.   Pernell Dupre, PharmD, BCPS Clinical Pharmacist 09/13/2017 7:53 AM

## 2017-09-14 LAB — GLUCOSE, CAPILLARY
GLUCOSE-CAPILLARY: 108 mg/dL — AB (ref 65–99)
Glucose-Capillary: 137 mg/dL — ABNORMAL HIGH (ref 65–99)
Glucose-Capillary: 216 mg/dL — ABNORMAL HIGH (ref 65–99)
Glucose-Capillary: 96 mg/dL (ref 65–99)

## 2017-09-14 LAB — PROTIME-INR
INR: 2.75
PROTHROMBIN TIME: 28.9 s — AB (ref 11.4–15.2)

## 2017-09-14 NOTE — Progress Notes (Signed)
Progress Note  Patient Name: Gregory Tyler Date of Encounter: 09/14/2017  Primary Cardiologist: Ida Rogue, MD   Subjective   Feeling well.  No chest pain or shortness of breath. Ambulated without difficulty.  Inpatient Medications    Scheduled Meds: . amiodarone  200 mg Oral Daily  . ammonium lactate   Topical BID  . atorvastatin  20 mg Oral Daily  . feeding supplement (ENSURE ENLIVE)  237 mL Oral BID BM  . furosemide  20 mg Oral Daily  . gabapentin  100 mg Oral QHS  . insulin aspart  0-9 Units Subcutaneous TID WC  . insulin glargine  7 Units Subcutaneous Daily  . lisinopril  5 mg Oral Daily  . metoprolol succinate  12.5 mg Oral Daily  . mometasone-formoterol  2 puff Inhalation BID  . multivitamin with minerals  1 tablet Oral Daily  . sodium chloride flush  3 mL Intravenous Q12H  . warfarin  5 mg Oral q1800  . Warfarin - Pharmacist Dosing Inpatient   Does not apply q1800   Continuous Infusions: . sodium chloride     PRN Meds: sodium chloride, acetaminophen **OR** acetaminophen, HYDROcodone-acetaminophen, ondansetron **OR** ondansetron (ZOFRAN) IV, polyethylene glycol, sodium chloride, sodium chloride flush   Vital Signs    Vitals:   09/13/17 1604 09/13/17 1938 09/14/17 0341 09/14/17 0814  BP: (!) 109/53 (!) 94/51 125/61 130/68  Pulse: (!) 51 (!) 52 (!) 51 (!) 57  Resp:  19 18   Temp: 98.3 F (36.8 C) 98.4 F (36.9 C) 98 F (36.7 C) 99.3 F (37.4 C)  TempSrc: Oral Oral Oral Oral  SpO2: 95% 99% 98% 99%  Weight:   154 lb 9.6 oz (70.1 kg)   Height:        Intake/Output Summary (Last 24 hours) at 09/14/2017 1442 Last data filed at 09/14/2017 1403 Gross per 24 hour  Intake 960 ml  Output 1900 ml  Net -940 ml   Filed Weights   09/12/17 0540 09/13/17 0358 09/14/17 0341  Weight: 161 lb 11.2 oz (73.3 kg) 156 lb 6.4 oz (70.9 kg) 154 lb 9.6 oz (70.1 kg)    Telemetry    Sinus brady/sinus rhythm.  Rate 40s-80s - Personally Reviewed  ECG    n/a -  Personally Reviewed  Physical Exam   VS:  BP 130/68   Pulse (!) 57   Temp 99.3 F (37.4 C) (Oral)   Resp 18   Ht 5\' 8"  (1.727 m)   Wt 154 lb 9.6 oz (70.1 kg)   SpO2 99%   BMI 23.51 kg/m  , BMI Body mass index is 23.51 kg/m. GENERAL:  Chronically ill-appearing HEENT: Pupils equal round and reactive, fundi not visualized, oral mucosa unremarkable NECK:  No jugular venous distention, waveform within normal limits, carotid upstroke brisk and symmetric, no bruits LUNGS:  Clear to auscultation bilaterally HEART:  RRR.  PMI not displaced or sustained,S1 and S2 within normal limits, no S3, no S4, no clicks, no rubs, III/VI systolic murmurs ABD:  Flat, positive bowel sounds normal in frequency in pitch, no bruits, no rebound, no guarding, no midline pulsatile mass, no hepatomegaly, no splenomegaly EXT:  2 plus pulses throughout, no edema, no cyanosis no clubbing SKIN:  No rashes no nodules NEURO:  Cranial nerves II through XII grossly intact, motor grossly intact throughout [facility]:  Cognitively intact, oriented to person place and time   [place]  Lab 09/11/17 1351 09/12/17 0143 09/13/17 8850  NA 139 140 139  K 4.6 4.0 4.1  CL 111 110 105  CO2 22 25 26   GLUCOSE 138* 173* 114*  BUN 28* 31* 31*  CREATININE 1.55* 1.43* 1.49*  CALCIUM 8.4* 8.0* 8.2*  PROT 6.2*  --   --   ALBUMIN 3.1*  --   --   AST 27  --   --   ALT 20  --   --   ALKPHOS 93  --   --   BILITOT 0.3  --   --   GFRNONAA 41* 46* 44*  GFRAA 48* 53* 50*  ANIONGAP 6 5 8      Hematology Recent Labs  Lab 09/11/17 1351 09/12/17 0143  WBC 6.2 5.4  RBC 4.07* 3.71*  HGB 11.5* 10.4*  HCT 35.3* 31.7*  MCV 86.7 85.6  MCH 28.3 28.1  MCHC 32.6 32.9  RDW 15.8* 15.8*  PLT 258 225    Cardiac Enzymes Recent Labs  Lab 09/11/17 1351 09/11/17 1952 09/12/17 0143 09/12/17 0804  TROPONINI 0.03* 0.03* 0.03* 0.03*   No results for input(s): TROPIPOC in the last 168 hours.   BNP Recent Labs    Lab 09/11/17 1352  BNP 1,046.0*     DDimer No results for input(s): DDIMER in the last 168 hours.   Radiology    No results found.  Cardiac Studies   Echo 09/12/17: Study Conclusions  - Left ventricle: The cavity size was normal. Systolic function was   normal. The estimated ejection fraction was in the range of 55%   to 60%. Wall motion was normal; there were no regional wall   motion abnormalities. Features are consistent with a pseudonormal   left ventricular filling pattern, with concomitant abnormal   relaxation and increased filling pressure (grade 2 diastolic   dysfunction). - Aortic valve: Peak velocity (S): 192 cm/s. Mean gradient (S): 8   mm Hg. - Mitral valve: There was mild regurgitation. - Left atrium: The atrium was mildly dilated. - Right ventricle: Systolic function was normal. - Pulmonary with chronic rteries: Systolic pressure was mildly elevated. PA   peak pressure: 35 mm Hg (S).  Patient Profile     77 y.o. male systolic and diastolic heart failure (LVEF improved on this admission) paroxysmal atrial fibrillation, diabetes, and hypertension here with acute on chronic heart failure in the setting of noncompliance.  Assessment & Plan    # Acute diastolic heart failure: LVEF normal this admission which is increased from prior.  He appears euvolemic on exam and is tolerating oral Lasix. Stressed the importance of taking his medications as prescribed.  Continue lasix 20mg  po daily 42 and lisinopril.  Ordered 40 mg IV Lasix given him his 20mg  and his blood pressure and then continue with the 40 changes   # PAF: Currently in sinus rhythm.  Continue amiodarone.  INR is therapuetic on warfarin.  # CKD: Creatinine stable.  # Hyperlipidemia: Continue atorvastatin.   Dispo:  Stable from a cardiac standpoint.  His main concern is his wife at home who has dementia and needs 24-hour care.  SNF recommended but he does not think he can is able to do this.  We will  sign off at this time.  For questions or updates, please contact [place] Please consult [email or web address] for contact info under Cardiology/STEMI.      Signed, Skeet Latch, MD  09/14/2017, 2:42 PM

## 2017-09-14 NOTE — Progress Notes (Signed)
Patient ID: Gregory Tyler, male   DOB: 10-25-1940, 77 y.o.   MRN: [id number]  [facility] PROGRESS NOTE  Gregory Tyler DOB: 1940/12/25 DOA: 09/11/2017 PCP: [name], MD  HPI/Subjective: Patient did not do well with physical therapy today.  Still feels weak.  Did not want me to do anything with his right groin pain.  Objective: Vitals:   09/14/17 0341 09/14/17 0814  BP: 125/61 130/68  Pulse: (!) 51 (!) 57  Resp: 18   Temp: 98 F (36.7 C) 99.3 F (37.4 C)  SpO2: 98% 99%    Filed Weights   09/12/17 0540 09/13/17 0358 09/14/17 0341  Weight: 73.3 kg (161 lb 11.2 oz) 70.9 kg (156 lb 6.4 oz) 70.1 kg (154 lb 9.6 oz)    ROS: Review of Systems  Constitutional: Negative for chills and fever.  Eyes: Negative for blurred vision.  Respiratory: Negative for cough and shortness of breath.   Cardiovascular: Negative for chest pain.  Gastrointestinal: Positive for abdominal pain. Negative for constipation, diarrhea, nausea and vomiting.  Genitourinary: Negative for dysuria.  Musculoskeletal: Negative for joint pain.  Neurological: Negative for dizziness and headaches.   Exam: Physical Exam  Constitutional: He is oriented to person, place, and time.  HENT:  Nose: No mucosal edema.  Mouth/Throat: No oropharyngeal exudate or posterior oropharyngeal edema.  Eyes: Pupils are equal, round, and reactive to light. Conjunctivae, EOM and lids are normal.  Neck: No JVD present. Carotid bruit is not present. No edema present. No thyroid mass and no thyromegaly present.  Cardiovascular: S1 normal and S2 normal. Exam reveals no gallop.  No murmur heard. Pulses:      Dorsalis pedis pulses are 2+ on the right side, and 2+ on the left side.  Respiratory: No respiratory distress. He has no wheezes. He has no rhonchi. He has no rales.  GI: Soft. Bowel sounds are normal. There is no tenderness.  Musculoskeletal:       Right ankle: He exhibits swelling.       Left  ankle: He exhibits swelling.  Lymphadenopathy:    He has no cervical adenopathy.  Neurological: He is alert and oriented to person, place, and time. No cranial nerve deficit.  Skin: Skin is warm. No rash noted. Nails show no clubbing.  Psychiatric: He has a normal mood and affect.      Data Reviewed: Basic Metabolic Panel: Recent Labs  Lab 09/11/17 1351 09/12/17 0143 09/13/17 0349  NA 139 140 139  K 4.6 4.0 4.1  CL 111 110 105  CO2 22 25 26   GLUCOSE 138* 173* 114*  BUN 28* 31* 31*  CREATININE 1.55* 1.43* 1.49*  CALCIUM 8.4* 8.0* 8.2*   Liver Function Tests: Recent Labs  Lab 09/11/17 1351  AST 27  ALT 20  ALKPHOS 93  BILITOT 0.3  PROT 6.2*  ALBUMIN 3.1*   CBC: Recent Labs  Lab 09/11/17 1351 09/12/17 0143  WBC 6.2 5.4  NEUTROABS 4.5  --   HGB 11.5* 10.4*  HCT 35.3* 31.7*  MCV 86.7 85.6  PLT 258 225   Cardiac Enzymes: Recent Labs  Lab 09/11/17 1351 09/11/17 1952 09/12/17 0143 09/12/17 0804  TROPONINI 0.03* 0.03* 0.03* 0.03*   BNP (last 3 results) Recent Labs    09/11/17 1352  BNP 1,046.0*     CBG: Recent Labs  Lab 09/13/17 1136 09/13/17 1606 09/13/17 2103 09/14/17 0812 09/14/17 1145  GLUCAP 204* 220* 118* 96 216*    Scheduled Meds: . amiodarone  200 mg Oral Daily  . ammonium lactate   Topical BID  . atorvastatin  20 mg Oral Daily  . feeding supplement (ENSURE ENLIVE)  237 mL Oral BID BM  . furosemide  20 mg Oral Daily  . gabapentin  100 mg Oral QHS  . insulin aspart  0-9 Units Subcutaneous TID WC  . insulin glargine  7 Units Subcutaneous Daily  . lisinopril  5 mg Oral Daily  . metoprolol succinate  12.5 mg Oral Daily  . mometasone-formoterol  2 puff Inhalation BID  . multivitamin with minerals  1 tablet Oral Daily  . sodium chloride flush  3 mL Intravenous Q12H  . warfarin  5 mg Oral q1800  . Warfarin - Pharmacist Dosing Inpatient   Does not apply q1800   Continuous Infusions: . sodium chloride       Assessment/Plan:  1. Acute on chronic diastolic congestive heart failure.  Patient on metoprolol, lisinopril and  p.o. Lasix 2. Chronic atrial fibrillation on amiodarone, metoprolol and warfarin.  INR therapeutic.  3. COPD.  Respiratory status stable. 4. Right groin pain he said ER physician reduced her hernia but he still having pain.  I was unable to palpate a hernia.  Patient does not want surgical evaluation. 5. Hyperlipidemia unspecified on atorvastatin 6. Type 2 diabetes mellitus on sliding scale and Lantus 7. Weakness.  PT recommend rehab  Code Status:     Code Status Orders  (From admission, onward)        Start     Ordered   09/11/17 1952  Full code  Continuous     09/11/17 1951    Code Status History    Date Active Date Inactive Code Status Order ID Comments User Context   05/11/2015 1307 05/13/2015 1256 Full Code [id number]  Aldean Jewett, MD Inpatient   04/25/2015 0149 04/27/2015 1741 Full Code [id number]  Hillary Bow, MD ED      Disposition Plan: Potential out to rehab tomorrow versus home with home health depending on how he does with physical therapy  Consultants:  Cardiology  Time spent:  35 minutes.  Case discussed with daughter on the phone.  They will have a family discussion on what to do with the patient and his wife with dementia.  The patient may refuse to go to rehab anyway.  Gregory Tyler [employer]

## 2017-09-14 NOTE — Progress Notes (Signed)
[place] for warfarin Indication: atrial fibrillation  Allergies  Allergen Reactions  . Aspirin     GI Bleeding  . Codeine Other (See Comments)    BLISTERS Other reaction(s): Other (See Comments) BLISTERS   . Codeine Sulfate Other (See Comments)    Other reaction(s): Other (See Comments) BLISTERS BLISTERS   Labs: Recent Labs    09/11/17 1351 09/11/17 1952 09/12/17 0143 09/12/17 0804 09/13/17 0349 09/14/17 0429  HGB 11.5*  --  10.4*  --   --   --   HCT 35.3*  --  31.7*  --   --   --   PLT 258  --  225  --   --   --   LABPROT  --   --  25.2*  --  27.9* 28.9*  INR  --   --  2.31  --  2.63 2.75  CREATININE 1.55*  --  1.43*  --  1.49*  --   TROPONINI 0.03* 0.03* 0.03* 0.03*  --   --     Estimated Creatinine Clearance: 40.2 mL/min (A) (by C-G formula based on SCr of 1.49 mg/dL (H)).   Medical History: Past Medical History:  Diagnosis Date  . Anemia   . Anxiety   . Arthritis   . Cardiomyopathy ([place])    a. 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  Marland Kitchen Chronic combined systolic (congestive) and diastolic (congestive) heart failure ([place])    a. 05/2015 TEE: EF 35-40% (in setting of Afib).  . Chronic Pelvic pain   . Coronary artery disease    a. 05/2015 CTA Chest: 3 vessel coronary Ca2+.  . Depression   . Diabetes mellitus without complication ([place])   . Emphysema lung ([place])   . Gastric ulcer   . Gout   . Hyperlipidemia   . Hypertension   . Nephrolithiasis   . PAF (paroxysmal atrial fibrillation) ([place])    a. 05/2015 s/p DCCV; b. 03/2016 s/p DCCV; c. CHA2DS2VASc = 6 (coumadin).  . Prostate cancer ([place])   . Stage III chronic kidney disease ([place])   . Weight loss    50 lb since Apr 24, 2015    Assessment: 77 year old male requiring warfarin therapy for afib. Patient presents therapeutic on warfarin with INR 2.5 on admission. Patient's home dose is warfarin 5mg  po daily.   Patient has had some supratherapeutic INRs  in recent stays. Drug interaction with amiodarone which he is also on at home.  DATE INR DOSE 5/22 2.5 5mg   5/23 2.31 5mg   5/24 2.63 5 mg 5/25     2.75   Goal of Therapy:  INR 2-3 Monitor platelets by anticoagulation protocol: Yes   Plan:  Continue Warfarin 5mg  po daily. Will check INR with AM labs.  Pharmacist will continue to follow per consult.   Olivia Canter [facility] Clinical Pharmacist 09/14/2017 9:14 AM

## 2017-09-14 NOTE — Evaluation (Signed)
Physical Therapy Evaluation Patient Details Name: Gregory Tyler MRN: [id number] DOB: [birth year] Today's Date: 09/14/2017   History of Present Illness  Pt is a 77y/o male presenting to ED with SOB and weight gain, and inability to void, pt admitted to the hospital for CHF with PMH including: a-fib, CHF, emphsema, and DM. Please H&P MD note for further PMH.   Clinical Impression  Pt reported he has experienced at least 4 falls in the last 6 months, while ambulating. Pt is the primary caregiver for his wife, who has dementia. Pt has limited family support, as his children work. Pt reported he has been having more difficulty with bathing, dressing, and ambulation over last few months. Pt no longer able to amb. To the mailbox 2/2 poor balance and decr. Strength. Pt reports hx of B rotator cuff tears and is unable to reach overhead, and has moved all items in kitchen to lower cabinets. Pt required min-mod A during bed mobility, min A during txfs with RW, and min A during gait with RW. PT discussed SNF placement for pt, to improve strength, endurance, balance, and gait deviations. Pt reluctant but does state he's been having a hard time lately. Pt would benefit from skilled PT to address above deficits and promote optimal return to PLOF.    Follow Up Recommendations SNF    Equipment Recommendations  None recommended by PT    Recommendations for Other Services       Precautions / Restrictions Precautions Precautions: Fall;Other (comment)(pt reports B rotator cuff tears, unable to reach overhead) Restrictions Weight Bearing Restrictions: No      Mobility  Bed Mobility Overal bed mobility: Needs Assistance Bed Mobility: Supine to Sit;Sit to Supine     Supine to sit: Mod assist Sit to supine: Min assist   General bed mobility comments: Mod A to guide trunk and one LE off EOB during supine to sit with HOB elevated and pt utilizing bedrails. Min A to guide one LE into bed, with pt utilizing  bedrails and cane to guide trunk and LEs into bed.   Transfers Overall transfer level: Needs assistance Equipment used: Rolling walker (2 wheeled) Transfers: Sit to/from Stand Sit to Stand: Min assist         General transfer comment: Cues for sequencing and hand placement during sit to stand txf with RW. Decr. eccentric control noted during stand to sit txf.   Ambulation/Gait Ambulation/Gait assistance: Min assist Ambulation Distance (Feet): 30 Feet Assistive device: Rolling walker (2 wheeled) Gait Pattern/deviations: Step-through pattern;Decreased stride length;Decreased dorsiflexion - left;Decreased dorsiflexion - right;Shuffle;Trunk flexed     General Gait Details: Cues to stay within RW and to improve posture and stride length. Min A to maintain balance during amb. with RW, attempted with tripod (hurrycane) but unable to 2/2 incr. postural sway and pt reporting LLE felt like it was going to buckle ("like someone hit it").   Stairs            Wheelchair Mobility    Modified Rankin (Stroke Patients Only)       Balance Overall balance assessment: Needs assistance Sitting-balance support: Feet supported;Bilateral upper extremity supported Sitting balance-Leahy Scale: Fair     Standing balance support: Bilateral upper extremity supported Standing balance-Leahy Scale: Poor Standing balance comment: with RW                             Pertinent Vitals/Pain Pain Assessment: No/denies pain Pain Score:  0-No pain    Home Living Family/patient expects to be discharged to:: Private residence Living Arrangements: Spouse/significant other Available Help at Discharge: Available PRN/intermittently Type of Home: [place]: One [place]: Environmental consultant - 2 wheels;Walker - 4 wheels;Cane - single point      Prior Function Level of Independence: Independent with assistive device(s)               Hand  Dominance        Extremity/Trunk Assessment   Upper Extremity Assessment Upper Extremity Assessment: Generalized weakness(unable to reach overhead 2/2 hx of rotator cuff tears per pt)    Lower Extremity Assessment Lower Extremity Assessment: Generalized weakness;RLE deficits/detail;LLE deficits/detail RLE Deficits / Details: BLE: RLE grossly 3+/5, with limited ankle DF, lacking approx. 5 degrees of knee ext. LLE knee contracture: lacking approx. 20 degrees of knee ext, grossly 3+/5, limited ankle ROM LLE Deficits / Details: see RLE for LLE details    Cervical / Trunk Assessment Cervical / Trunk Assessment: Kyphotic;Lordotic(Tx: kyphotic, Cx: lordotic, extreme FHP)  Communication   Communication: HOH  Cognition Arousal/Alertness: Awake/alert Behavior During Therapy: WFL for tasks assessed/performed                                          General Comments      Exercises     Assessment/Plan    PT Assessment Patient needs continued PT services  PT Problem List Decreased strength;Decreased range of motion;Decreased activity tolerance;Decreased balance;Decreased mobility;Decreased knowledge of use of DME       PT Treatment Interventions DME instruction;Gait training;Functional mobility training;Therapeutic activities;Therapeutic exercise;Balance training;Neuromuscular re-education;Patient/family education    PT Goals (Current goals can be found in the Care Plan section)  Acute Rehab PT Goals Patient Stated Goal: To go home. PT Goal Formulation: With patient Time For Goal Achievement: 09/28/17 Potential to Achieve Goals: Good    Frequency Min 2X/week   Barriers to discharge Decreased caregiver support Pt cares for his wife, who has dementia. Pt has limited support as his children work.     Co-evaluation               AM-PAC PT "6 Clicks" Daily Activity  Outcome Measure Difficulty turning over in bed (including adjusting bedclothes, sheets and  blankets)?: A Little Difficulty moving from lying on back to sitting on the side of the bed? : A Little Difficulty sitting down on and standing up from a chair with arms (e.g., wheelchair, bedside commode, etc,.)?: A Lot Help needed moving to and from a bed to chair (including a wheelchair)?: A Little Help needed walking in hospital room?: A Little Help needed climbing 3-5 steps with a railing? : Total 6 Click Score: 15    End of Session Equipment Utilized During Treatment: Gait belt Activity Tolerance: No increased pain;Patient limited by fatigue(pt required 3 seated rest breaks 2/2 fatigue) Patient left: in bed;with nursing/sitter in room Nurse Communication: Mobility status(MD and RN informed of SNF recommendation and safety concerns) PT Visit Diagnosis: Unsteadiness on feet (R26.81);Other abnormalities of gait and mobility (R26.89);Repeated falls (R29.6);History of falling (Z91.81);Muscle weakness (generalized) (M62.81)    Time: 1002(pt with MD until 1010)-1038 PT Time Calculation (min) (ACUTE ONLY): 36 min   Charges:   PT Evaluation $PT Eval Moderate Complexity: 1 Mod PT Treatments $Gait Training: 8-22 mins   PT G Codes:  Geoffry Paradise, PT,DPT 09/14/17 11:10 AM Phone: [phone] Fax: [phone]     Tanaia Hawkey L 09/14/2017, 11:07 AM

## 2017-09-14 NOTE — Clinical Social Work Note (Signed)
CSW attempted to assess the patient at bedside due to PT recommendation for SNF. The patient was not in the room. CSW will attempt again, tomorrow. CSW is following.  Santiago Bumpers, MSW, Latanya Presser [phone]

## 2017-09-15 LAB — PROTIME-INR
INR: 3.36
Prothrombin Time: 33.8 seconds — ABNORMAL HIGH (ref 11.4–15.2)

## 2017-09-15 LAB — GLUCOSE, CAPILLARY
Glucose-Capillary: 93 mg/dL (ref 65–99)
Glucose-Capillary: 199 mg/dL — ABNORMAL HIGH (ref 65–99)

## 2017-09-15 MED ORDER — METOPROLOL SUCCINATE ER 25 MG PO TB24: 12.5000 mg | ORAL_TABLET | Freq: Every day | ORAL | 0 refills | Status: DC

## 2017-09-15 MED ORDER — FUROSEMIDE 20 MG PO TABS: 20.0000 mg | ORAL_TABLET | Freq: Every day | ORAL | 3 refills | Status: DC

## 2017-09-15 MED ORDER — AMMONIUM LACTATE 12 % EX LOTN: TOPICAL_LOTION | CUTANEOUS | 0 refills | Status: DC

## 2017-09-15 MED ORDER — LISINOPRIL 5 MG PO TABS: 5.0000 mg | ORAL_TABLET | Freq: Every day | ORAL | 0 refills | Status: DC

## 2017-09-15 MED ORDER — WARFARIN SODIUM 4 MG PO TABS: 4.0000 mg | ORAL_TABLET | Freq: Every day | ORAL | 0 refills | Status: DC

## 2017-09-15 MED ORDER — GABAPENTIN 100 MG PO CAPS: 100.0000 mg | ORAL_CAPSULE | Freq: Every day | ORAL | 0 refills | Status: DC

## 2017-09-15 MED ORDER — ACETAMINOPHEN 325 MG PO TABS: 650.0000 mg | ORAL_TABLET | Freq: Four times a day (QID) | ORAL | Status: AC | PRN

## 2017-09-15 MED ORDER — ENSURE ENLIVE PO LIQD: 237.0000 mL | Freq: Two times a day (BID) | ORAL | 0 refills | Status: DC

## 2017-09-15 NOTE — Clinical Social Work Note (Signed)
CSW is signing off as the patient has decided to discharge home with home health services. Please consult should needs arise.  Santiago Bumpers, MSW, Latanya Presser [phone]

## 2017-09-15 NOTE — Discharge Summary (Signed)
[facility] NAME: Gregory Tyler    MR#:  [id number]  DATE OF BIRTH:  [birth year]  DATE OF ADMISSION:  09/11/2017 ADMITTING PHYSICIAN: Bettey Costa, MD  DATE OF DISCHARGE: 09/15/2017 12:42 PM  PRIMARY CARE PHYSICIAN: [name], MD    ADMISSION DIAGNOSIS:  Peripheral edema [R60.9] Other hypervolemia [E87.79]  DISCHARGE DIAGNOSIS:  Active Problems:   CHF (congestive heart failure) (HCC)   Malnutrition of moderate degree   SECONDARY DIAGNOSIS:   Past Medical History:  Diagnosis Date  . Anemia   . Anxiety   . Arthritis   . Cardiomyopathy ([place])    a. 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  Marland Kitchen Chronic combined systolic (congestive) and diastolic (congestive) heart failure ([place])    a. 05/2015 TEE: EF 35-40% (in setting of Afib).  . Chronic Pelvic pain   . Coronary artery disease    a. 05/2015 CTA Chest: 3 vessel coronary Ca2+.  . Depression   . Diabetes mellitus without complication ([place])   . Emphysema lung ([place])   . Gastric ulcer   . Gout   . Hyperlipidemia   . Hypertension   . Nephrolithiasis   . PAF (paroxysmal atrial fibrillation) ([place])    a. 05/2015 s/p DCCV; b. 03/2016 s/p DCCV; c. CHA2DS2VASc = 6 (coumadin).  . Prostate cancer ([place])   . Stage III chronic kidney disease ([place])   . Weight loss    50 lb since Apr 24, 2015    HOSPITAL COURSE:   1.  Acute on chronic diastolic congestive heart failure with lower extremity edema and scrotal edema.  The patient was diuresed with IV Lasix during the hospital course.  Patient on metoprolol and lisinopril.  Switch over to p.o. Lasix upon going home.  The issue he has he takes care of his wife and does not take his p.o. Lasix. 2.  Chronic atrial fibrillation on amiodarone, metoprolol and warfarin.  INR starting to creep up and his Coumadin was decreased down to 4 mg daily.  His metoprolol was decreased during the hospital course. 3.  COPD.  Respiratory  status stable.  Continue usual inhalers 4.  Right groin pain.  He said the ER physician reduced her hernia.  He was still having pain on and off during the hospital course.  I was unable to palpate a hernia.  Patient did not want me to get the surgeon to evaluate him. 5.  Hyperlipidemia unspecified on atorvastatin 6.  Increase QTC on EKG.  Case discussed with cardiology and he is not on any medications that can cause this.  It is close to his normal.  Continue to monitor as outpatient. 7.  Type 2 diabetes mellitus on metformin as outpatient 8.  Weakness.  Physical therapy recommends rehab.  The patient refused rehab.  I spoke with the patient's daughter.  She stated I need to send him home today.  She is working on having people stay with them during the day.  She will try to stay with them at night.  I will set up home health.  They are looking into assisted living for the both of them.  Case also discussed with Dr. Doy Hutching his primary care physician   DISCHARGE CONDITIONS:   Fair   CONSULTS OBTAINED:  Treatment Team:  Minna Merritts, MD  DRUG ALLERGIES:   Allergies  Allergen Reactions  . Aspirin     GI Bleeding  . Codeine  Other (See Comments)    BLISTERS Other reaction(s): Other (See Comments) BLISTERS   . Codeine Sulfate Other (See Comments)    Other reaction(s): Other (See Comments) BLISTERS BLISTERS    DISCHARGE MEDICATIONS:   Allergies as of 09/15/2017      Reactions   Aspirin    GI Bleeding   Codeine Other (See Comments)   BLISTERS Other reaction(s): Other (See Comments) BLISTERS   Codeine Sulfate Other (See Comments)   Other reaction(s): Other (See Comments) BLISTERS BLISTERS      Medication List    STOP taking these medications   metoprolol tartrate 25 MG tablet Commonly known as:  LOPRESSOR     TAKE these medications   acetaminophen 325 MG tablet Commonly known as:  TYLENOL Take 2 tablets (650 mg total) by mouth every 6 (six) hours as needed for  mild pain (or Fever >/= 101).   albuterol 108 (90 Base) MCG/ACT inhaler Commonly known as:  PROVENTIL HFA;VENTOLIN HFA Inhale 2 puffs into the lungs every 6 (six) hours as needed for wheezing or shortness of breath.   amiodarone 200 MG tablet Commonly known as:  PACERONE Take 1 tablet (200 mg total) by mouth daily.   ammonium lactate 12 % lotion Commonly known as:  LAC-HYDRIN Apply twice a day to feet   atorvastatin 20 MG tablet Commonly known as:  LIPITOR Take 20 mg by mouth daily.   feeding supplement (ENSURE ENLIVE) Liqd Take 237 mLs by mouth 2 (two) times daily between meals.   furosemide 20 MG tablet Commonly known as:  LASIX Take 1 tablet (20 mg total) by mouth daily. What changed:    when to take this  reasons to take this   gabapentin 100 MG capsule Commonly known as:  NEURONTIN Take 1 capsule (100 mg total) by mouth at bedtime.   lisinopril 5 MG tablet Commonly known as:  PRINIVIL,ZESTRIL Take 1 tablet (5 mg total) by mouth daily.   Medical Compression Thigh High Misc 1 application by Does not apply route daily.   metFORMIN 500 MG tablet Commonly known as:  GLUCOPHAGE Take by mouth 2 (two) times daily with a meal.   metoprolol succinate 25 MG 24 hr tablet Commonly known as:  TOPROL-XL Take 0.5 tablets (12.5 mg total) by mouth daily.   sodium chloride 0.65 % Soln nasal spray Commonly known as:  OCEAN Place 1 spray into both nostrils as needed for congestion.   SYMBICORT 160-4.5 MCG/ACT inhaler Generic drug:  budesonide-formoterol Inhale 2 puffs into the lungs 2 (two) times daily.   warfarin 4 MG tablet Commonly known as:  COUMADIN Take as directed. If you are unsure how to take this medication, talk to your nurse or doctor. Original instructions:  Take 1 tablet (4 mg total) by mouth daily. What changed:    medication strength  how much to take            Durable Medical Equipment  (From admission, onward)        Start     Ordered    09/11/17 0000  Heart failure home health orders  (Heart failure home health orders / Face to face)    Comments:  Heart Failure Follow-up Care:  Verify follow-up appointments per Patient Discharge Instructions. Confirm transportation arranged. Reconcile home medications with discharge medication list. Remove discontinued medications from use. Assist patient/caregiver to manage medications using pill box. Reinforce low sodium food selection Assessments: Vital signs and oxygen saturation at each visit. Assess home environment for  safety concerns, caregiver support and availability of low-sodium foods. Consult Education officer, museum, PT/OT, Dietitian, and CNA based on assessments. Perform comprehensive cardiopulmonary assessment. Notify MD for any change in condition or weight gain of 3 pounds in one day or 5 pounds in one week with symptoms. Daily Weights and Symptom Monitoring: Ensure patient has access to scales. Teach patient/caregiver to weigh daily before breakfast and after voiding using same scale and record.    Teach patient/caregiver to track weight and symptoms and when to notify Provider. Activity: Develop individualized activity plan with patient/caregiver.   Question Answer Comment  Heart Failure Follow-up Care Or per Doctor (see comments)   Obtain the following labs Basic Metabolic Panel   Lab frequency Weekly   Fax lab results to [facility] Clinic at [phone]   Diet Low Sodium Heart Healthy   Fluid restrictions: See other comments per cardiology  Consult: Case manager   Consult: Social work      09/11/17 1744       DISCHARGE INSTRUCTIONS:   Follow up PMD 6 days Follow-up at CHF clinic  follow-up with Dr. Rockey Situ   If you experience worsening of your admission symptoms, develop shortness of breath, life threatening emergency, suicidal or homicidal thoughts you must seek medical attention immediately by calling 911 or calling your MD immediately  if symptoms less severe.  You  Must read complete instructions/literature along with all the possible adverse reactions/side effects for all the Medicines you take and that have been prescribed to you. Take any new Medicines after you have completely understood and accept all the possible adverse reactions/side effects.   Please note  You were cared for by a hospitalist during your hospital stay. If you have any questions about your discharge medications or the care you received while you were in the hospital after you are discharged, you can call the unit and asked to speak with the hospitalist on call if the hospitalist that took care of you is not available. Once you are discharged, your primary care physician will handle any further medical issues. Please note that NO REFILLS for any discharge medications will be authorized once you are discharged, as it is imperative that you return to your primary care physician (or establish a relationship with a primary care physician if you do not have one) for your aftercare needs so that they can reassess your need for medications and monitor your lab values.    Today   CHIEF COMPLAINT:   Chief Complaint  Patient presents with  . Leg Swelling    HISTORY OF PRESENT ILLNESS:  Gregory Tyler  is a 77 y.o. male presented with leg swelling   VITAL SIGNS:  Blood pressure (!) 118/59, pulse (!) 53, temperature 98.5 F (36.9 C), temperature source Oral, resp. rate 14, height 5\' 8"  (1.727 m), weight 70.4 kg (155 lb 3.2 oz), SpO2 97 %.    PHYSICAL EXAMINATION:  GENERAL:  77 y.o.-year-old patient lying in the bed with no acute distress.  EYES: Pupils equal, round, reactive to light and accommodation. No scleral icterus. Extraocular muscles intact.  HEENT: Head atraumatic, normocephalic. Oropharynx and nasopharynx clear.  NECK:  Supple, no jugular venous distention. No thyroid enlargement, no tenderness.  LUNGS: Normal breath sounds bilaterally, no wheezing, rales,rhonchi or  crepitation. No use of accessory muscles of respiration.  CARDIOVASCULAR: S1, S2 normal. No murmurs, rubs, or gallops.  ABDOMEN: Soft, non-tender, non-distended. Bowel sounds present. No organomegaly or mass.  EXTREMITIES: 1+ pedal edema, no cyanosis, or clubbing.  NEUROLOGIC: Cranial nerves II through XII are intact. Muscle strength 5/5 in all extremities. Sensation intact. Gait not checked.  PSYCHIATRIC: The patient is alert and oriented x 3.  SKIN:  scaling bilateral feet  DATA REVIEW:   CBC Recent Labs  Lab 09/12/17 0143  WBC 5.4  HGB 10.4*  HCT 31.7*  PLT 225    Chemistries  Recent Labs  Lab 09/11/17 1351  09/13/17 0349  NA 139   < > 139  K 4.6   < > 4.1  CL 111   < > 105  CO2 22   < > 26  GLUCOSE 138*   < > 114*  BUN 28*   < > 31*  CREATININE 1.55*   < > 1.49*  CALCIUM 8.4*   < > 8.2*  AST 27  --   --   ALT 20  --   --   ALKPHOS 93  --   --   BILITOT 0.3  --   --    < > = values in this interval not displayed.    Cardiac Enzymes Recent Labs  Lab 09/12/17 0804  TROPONINI 0.03*    Microbiology Results  Results for orders placed or performed during the hospital encounter of 04/24/15  MRSA PCR Screening     Status: None   Collection Time: 04/25/15  2:40 AM  Result Value Ref Range Status   MRSA by PCR NEGATIVE NEGATIVE Final    Comment:        The GeneXpert MRSA Assay (FDA approved for NASAL specimens only), is one component of a comprehensive MRSA colonization surveillance program. It is not intended to diagnose MRSA infection nor to guide or monitor treatment for MRSA infections.      Management plans discussed with the patient, family and they are in agreement.  Case also discussed with primary care physician.  CODE STATUS:     Code Status Orders  (From admission, onward)        Start     Ordered   09/11/17 1952  Full code  Continuous     09/11/17 1951    Code Status History    Date Active Date Inactive Code Status Order ID  Comments User Context   05/11/2015 1307 05/13/2015 1256 Full Code [id number]  Aldean Jewett, MD Inpatient   04/25/2015 0149 04/27/2015 1741 Full Code [id number]  Hillary Bow, MD ED      TOTAL TIME TAKING CARE OF THIS PATIENT: 40 minutes.    Loletha Grayer M.D on 09/15/2017 at 1:54 PM  Between 7am to 6pm - Pager - [phone]  After 6pm go to [email or web address] - password [email or web address] [facility]  [phone]  CC: Primary care physician; [name], MD

## 2017-09-15 NOTE — Progress Notes (Signed)
CCMD notified RN of QTC >500. MD notified. Orders for EKG given. I will continue to assess.

## 2017-09-15 NOTE — Care Management Note (Signed)
Case Management Note  Patient Details  Name: Gregory Tyler MRN: [id number] Date of Birth: 1940-11-11  Subjective/Objective:  Met with patient and his son at bedside to discuss discharge planning. Patient is receptive to home health care. He states his wife has [facility] and he prefers to use them as well. Referral to [facility] for RN, PT, OT, HHA and SW. He lives with his wife who has parkinson's and has 24 hour care. He has 2 walkers and several rolators. No DME needs. Patient discharging today                    Action/Plan:   Expected Discharge Date:  09/15/17               Expected Discharge Plan:  [place]  In-House Referral:     Discharge planning Services  CM Consult  Post Acute Care Choice:  Home Health Choice offered to:  Patient  DME Arranged:    DME Agency:     HH Arranged:  RN, PT, OT, Nurse's Aide, Social Work [employer] Agency:  [facility]  Status of Service:  Completed, signed off  If discussed at [employer], dates discussed:    Additional Comments:  Jolly Mango, RN 09/15/2017, 11:48 AM

## 2017-09-15 NOTE — Progress Notes (Signed)
[place] for warfarin Indication: atrial fibrillation  Allergies  Allergen Reactions  . Aspirin     GI Bleeding  . Codeine Other (See Comments)    BLISTERS Other reaction(s): Other (See Comments) BLISTERS   . Codeine Sulfate Other (See Comments)    Other reaction(s): Other (See Comments) BLISTERS BLISTERS   Labs: Recent Labs    09/13/17 0349 09/14/17 0429 09/15/17 0435  LABPROT 27.9* 28.9* 33.8*  INR 2.63 2.75 3.36  CREATININE 1.49*  --   --     Estimated Creatinine Clearance: 40.2 mL/min (A) (by C-G formula based on SCr of 1.49 mg/dL (H)).   Medical History: Past Medical History:  Diagnosis Date  . Anemia   . Anxiety   . Arthritis   . Cardiomyopathy ([place])    a. 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  Marland Kitchen Chronic combined systolic (congestive) and diastolic (congestive) heart failure ([place])    a. 05/2015 TEE: EF 35-40% (in setting of Afib).  . Chronic Pelvic pain   . Coronary artery disease    a. 05/2015 CTA Chest: 3 vessel coronary Ca2+.  . Depression   . Diabetes mellitus without complication ([place])   . Emphysema lung ([place])   . Gastric ulcer   . Gout   . Hyperlipidemia   . Hypertension   . Nephrolithiasis   . PAF (paroxysmal atrial fibrillation) ([place])    a. 05/2015 s/p DCCV; b. 03/2016 s/p DCCV; c. CHA2DS2VASc = 6 (coumadin).  . Prostate cancer ([place])   . Stage III chronic kidney disease ([place])   . Weight loss    50 lb since Apr 24, 2015    Assessment: 77 year old male requiring warfarin therapy for afib. Patient presents therapeutic on warfarin with INR 2.5 on admission. Patient's home dose is warfarin 5mg  po daily.   Patient has had some supratherapeutic INRs in recent stays. Drug interaction with amiodarone which he is also on at home.  DATE INR DOSE 5/22 2.5 5mg   5/23 2.31 5mg   5/24 2.63 5 mg 5/25     2.75     5 mg 5/26     3.26  Goal of Therapy:  INR 2-3 Monitor platelets by  anticoagulation protocol: Yes   Plan:  Hold tonight's warfarin dose. Will check INR with AM labs.  Pharmacist will continue to follow per consult.   Olivia Canter [facility] Clinical Pharmacist 09/15/2017 9:08 AM

## 2017-09-16 NOTE — Progress Notes (Deleted)
Patient ID: Gregory Tyler, male    DOB: 06-17-1940, 77 y.o.   MRN: [id number]  HPI  Gregory Tyler is a 77 y/o male with a history of prostate cancer, HTN, hyperlipidemia, gout, gastric ulcer, emphysema, DM, CAD, depression, chronic atrial fibrillation, anxiety, anemia, remote tobacco use and chronic heart failure.  Echo done 09/12/17 reviewed and showed an EF of 55-60% along with mild Gregory and a slightly elevated PA pressure of 35 mm Hg. Echo done 04/25/15 and showed an EF of 35-40% along with mild/moderate Gregory and mildly elevated PA pressure of 40 mm Hg.   Admitted 09/11/17 due to acute on chronic HF. Cardiology consult obtained. Initially received IV lasix and then transitioned back to oral diuretics. Discharged home with home health after 4 days.    He presents today for a follow-up visit with    Past Medical History:  Diagnosis Date  . Anemia   . Anxiety   . Arthritis   . Cardiomyopathy ([place])    a. 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  Marland Kitchen Chronic combined systolic (congestive) and diastolic (congestive) heart failure ([place])    a. 05/2015 TEE: EF 35-40% (in setting of Afib).  . Chronic Pelvic pain   . Coronary artery disease    a. 05/2015 CTA Chest: 3 vessel coronary Ca2+.  . Depression   . Diabetes mellitus without complication ([place])   . Emphysema lung ([place])   . Gastric ulcer   . Gout   . Hyperlipidemia   . Hypertension   . Nephrolithiasis   . PAF (paroxysmal atrial fibrillation) ([place])    a. 05/2015 s/p DCCV; b. 03/2016 s/p DCCV; c. CHA2DS2VASc = 6 (coumadin).  . Prostate cancer ([place])   . Stage III chronic kidney disease ([place])   . Weight loss    50 lb since Apr 24, 2015   Past Surgical History:  Procedure Laterality Date  . APPENDECTOMY    . BACK SURGERY    . CHOLECYSTECTOMY    . ELECTROPHYSIOLOGIC STUDY N/A 06/17/2015   Procedure: CARDIOVERSION;  Surgeon: Wellington Hampshire, MD;  Location: [facility] ORS;  Service: Cardiovascular;  Laterality: N/A;  .  ELECTROPHYSIOLOGIC STUDY N/A 03/30/2016   Procedure: CARDIOVERSION;  Surgeon: Minna Merritts, MD;  Location: [facility] ORS;  Service: Cardiovascular;  Laterality: N/A;  . GASTRIC BYPASS    . HEMORRHOID SURGERY    . pilonidal cyst    . TEE WITHOUT CARDIOVERSION N/A 06/17/2015   Procedure: TRANSESOPHAGEAL ECHOCARDIOGRAM (TEE);  Surgeon: Wellington Hampshire, MD;  Location: [facility] ORS;  Service: Cardiovascular;  Laterality: N/A;   Family History  Problem Relation Age of Onset  . CAD Mother   . Stroke Mother   . Diabetes Other    Social History   Tobacco Use  . Smoking status: Former Research scientist (life sciences)  . Smokeless tobacco: Former Network engineer Use Topics  . Alcohol use: No   Allergies  Allergen Reactions  . Aspirin     GI Bleeding  . Codeine Other (See Comments)    BLISTERS Other reaction(s): Other (See Comments) BLISTERS   . Codeine Sulfate Other (See Comments)    Other reaction(s): Other (See Comments) BLISTERS BLISTERS      Review of Systems  Constitutional: Positive for fatigue. Negative for appetite change.  HENT: Negative for congestion and postnasal drip.   Eyes: Negative.   Respiratory: Negative for chest tightness and shortness of breath.   Cardiovascular: Negative for chest pain, palpitations and leg  swelling.  Gastrointestinal: Negative for abdominal distention and abdominal pain.  Endocrine: Negative.   Genitourinary: Negative.   Musculoskeletal: Positive for back pain. Negative for neck pain.  Skin: Negative.   Allergic/Immunologic: Negative.   Neurological: Positive for light-headedness and numbness (tingling in feet). Negative for dizziness.  Hematological: Negative for adenopathy. Bruises/bleeds easily.  Psychiatric/Behavioral: Positive for sleep disturbance (due to wife's sleep pattern). Negative for dysphoric mood. The patient is not nervous/anxious.      Physical Exam  Constitutional: He is oriented to person, place, and time. He appears well-developed and  well-nourished.  HENT:  Head: Normocephalic and atraumatic.  Eyes: Pupils are equal, round, and reactive to light. Conjunctivae are normal.  Neck: Normal range of motion. Neck supple. No JVD present.  Cardiovascular: Regular rhythm. Bradycardia present.  Pulmonary/Chest: Effort normal. He has no wheezes. He has no rales.  Abdominal: Soft. He exhibits no distension. There is no tenderness.  Musculoskeletal: He exhibits no edema or tenderness.  Neurological: He is alert and oriented to person, place, and time.  Skin: Skin is warm and dry.  Psychiatric: He has a normal mood and affect. His behavior is normal. Thought content normal.  Nursing note and vitals reviewed.  Assessment & Plan:  1: Chronic heart failure with preserved ejection fraction- - NYHA class III - euvolemic - weight up 9 pounds since he was last here on 03/14/16. Reminded to call for an overnight weight gain of >2 pounds or a weekly weight gain of >5 pounds.  - Not adding salt to his food although he says that it's difficult for him to find something to eat that doesn't have a lot of salt in it. Tends to eat out often due to his wife's health. -  - saw cardiologist Gregory Tyler) 09/02/17 - EF has markedly improved so not a candidate for entresto at this time - BNP 09/11/17 was 1046.0  2: HTN- - BP looks good today - continue medications at this time - BMP 09/13/17 reviewed and showed sodium 138, potassium 4.4 and GFR 44  3: Atrial fibrillation- - successfully cardioverted December 2017 - taking amiodarone, cartia XT, metoprolol and warfarin - was at the coumadin clinic 09/11/17  4: Diabetes- - glucose was  - saw PCP (Gregory Tyler) 08/14/17  Medication bottles were reviewed.

## 2017-09-18 ENCOUNTER — Ambulatory Visit: Payer: Medicare Other | Admitting: Family

## 2017-09-18 NOTE — Telephone Encounter (Signed)
Patient contacted regarding discharge from [facility] on 09/15/17.   Patient understands to follow up with provider ? On 10/03/17 at 10:40am at [facility].  Patient understands discharge instructions? Yes Patient understands medications and regiment? Yes  Patient understands to bring all medications to this visit? Yes

## 2017-09-23 ENCOUNTER — Other Ambulatory Visit: Payer: Self-pay | Admitting: Cardiovascular Disease

## 2017-10-02 ENCOUNTER — Other Ambulatory Visit: Payer: Self-pay | Admitting: Cardiovascular Disease

## 2017-10-02 NOTE — Progress Notes (Signed)
Cardiology Office Note  Date:  10/03/2017   ID:  Gregory Tyler, DOB 1940/06/21, MRN [id number]  PCP:  [name], MD   Chief Complaint  Patient presents with  . OTHER    F/u hospital due to CHF and Peripheral edema c/o edema grion/legs ankles. Meds reviewed verbally with pt.    HPI:  Gregory Tyler is a pleasant 77 year old gentleman With history of  paroxysmal atrial fibrillation On warfarin,  diabetes,  hospital 04/25/2015 with shortness of breath,  Found to have atrial fibrillation with RVR, severe hypertension,  Placed on BiPAP for respiratory distress.  cardioversion 06/17/2015 which was successful He presents for routine follow-up of his paroxysmal atrial fibrillation , cardioversion 03/30/2016, recent hospital admission for acute on chronic diastolic CHF  Worsening lower extremity edema, scrotal swelling and inguinal hernia Admitted to the hospital Hospital d/c 09/15/2017, aggressive diuresis with improvement of his leg edema Reports that current weight at home 151 pounds Taking lasix 40 mg daily Creatinine 1.5 BUN 31 at the time of discharge  Hernia still bothering him, still with pain Having home issues, getting a ride, needs to take care of his wife He presents today in a wheelchair Still with mild edema lower extremities Denies significant shortness of breath  Son is helping, he is here today  EKG personally reviewed by myself on todays visit Shows NSR rate 61 bpm, intraventricular conduction delay  Other past medical history reviewed  elevated BUN/creatinine with overdiuresis 03/27/2016, Lasix dose was decreased. Was previously taking Lasix daily  Atrial fib in 12/16/2015 in CHF clinic per the exam note , rate >100  back in April 2017 was sinus rhythm   in January 2017, blood pressure was 80 systolic, Lasix was held Symptoms of shortness of breath perhaps worse after he stopped his Symbicort 2 weeks ago. Does not have the money to refill    Previous echocardiogram June or 2017 with ejection fraction 35-40% while in atrial fibrillation   PMH:   has a past medical history of Anemia, Anxiety, Arthritis, Cardiomyopathy ([place]), Chronic combined systolic (congestive) and diastolic (congestive) heart failure ([place]), Chronic Pelvic pain, Coronary artery disease, Depression, Diabetes mellitus without complication ([place]), Emphysema lung ([place]), Gastric ulcer, Gout, Hyperlipidemia, Hypertension, Nephrolithiasis, PAF (paroxysmal atrial fibrillation) ([place]), Prostate cancer ([place]), Stage III chronic kidney disease ([place]), and Weight loss.  PSH:    Past Surgical History:  Procedure Laterality Date  . APPENDECTOMY    . BACK SURGERY    . CHOLECYSTECTOMY    . ELECTROPHYSIOLOGIC STUDY N/A 06/17/2015   Procedure: CARDIOVERSION;  Surgeon: Wellington Hampshire, MD;  Location: [facility] ORS;  Service: Cardiovascular;  Laterality: N/A;  . ELECTROPHYSIOLOGIC STUDY N/A 03/30/2016   Procedure: CARDIOVERSION;  Surgeon: Minna Merritts, MD;  Location: [facility] ORS;  Service: Cardiovascular;  Laterality: N/A;  . GASTRIC BYPASS    . HEMORRHOID SURGERY    . pilonidal cyst    . TEE WITHOUT CARDIOVERSION N/A 06/17/2015   Procedure: TRANSESOPHAGEAL ECHOCARDIOGRAM (TEE);  Surgeon: Wellington Hampshire, MD;  Location: [facility] ORS;  Service: Cardiovascular;  Laterality: N/A;    Current Outpatient Medications  Medication Sig Dispense Refill  . acetaminophen (TYLENOL) 325 MG tablet Take 2 tablets (650 mg total) by mouth every 6 (six) hours as needed for mild pain (or Fever >/= 101).    Marland Kitchen albuterol (PROVENTIL HFA;VENTOLIN HFA) 108 (90 Base) MCG/ACT inhaler Inhale 2 puffs into the lungs every 6 (six) hours as needed for wheezing or shortness of breath. 1 Inhaler 6  .  amiodarone (PACERONE) 200 MG tablet TAKE 1 TABLET BY MOUTH ONCE DAILY 90 tablet 3  . ammonium lactate (LAC-HYDRIN) 12 % lotion Apply twice a day to feet 400 g 0  . atorvastatin (LIPITOR) 20 MG tablet TAKE 1 TABLET BY MOUTH  ONCE DAILY 90 tablet 3  . Elastic Bandages & Supports (MEDICAL COMPRESSION THIGH HIGH) MISC 1 application by Does not apply route daily. 4 each 0  . feeding supplement, ENSURE ENLIVE, (ENSURE ENLIVE) LIQD Take 237 mLs by mouth 2 (two) times daily between meals. 60 Bottle 0  . furosemide (LASIX) 20 MG tablet Take 1 tablet (20 mg total) by mouth daily. 90 tablet 3  . gabapentin (NEURONTIN) 100 MG capsule Take 1 capsule (100 mg total) by mouth at bedtime. 30 capsule 0  . HYDROcodone-acetaminophen (NORCO/VICODIN) 5-325 MG tablet Take by mouth.    Marland Kitchen lisinopril (PRINIVIL,ZESTRIL) 5 MG tablet Take 1 tablet (5 mg total) by mouth daily. 30 tablet 0  . metFORMIN (GLUCOPHAGE) 500 MG tablet Take by mouth 2 (two) times daily with a meal.    . metoprolol succinate (TOPROL-XL) 25 MG 24 hr tablet Take 0.5 tablets (12.5 mg total) by mouth daily. 15 tablet 0  . sodium chloride (OCEAN) 0.65 % SOLN nasal spray Place 1 spray into both nostrils as needed for congestion.    . SYMBICORT 160-4.5 MCG/ACT inhaler Inhale 2 puffs into the lungs 2 (two) times daily.     Marland Kitchen warfarin (COUMADIN) 4 MG tablet Take 1 tablet (4 mg total) by mouth daily. 30 tablet 0   No current facility-administered medications for this visit.      Allergies:   Aspirin; Codeine; and Codeine sulfate   Social History:  The patient  reports that he has quit smoking. His smokeless tobacco use includes chew. He reports that he does not drink alcohol or use drugs.   Family History:   family history includes CAD in his mother; Diabetes in his other; Stroke in his mother.    Review of Systems: Review of Systems  Constitutional: Negative.   Respiratory: Negative.   Cardiovascular: Positive for leg swelling.  Gastrointestinal: Negative.   Genitourinary:        scrotal swelling  Musculoskeletal: Positive for joint pain.  Neurological: Negative.   Psychiatric/Behavioral: Negative.   All other systems reviewed and are negative.    PHYSICAL  EXAM: VS:  BP 110/60 (BP Location: Left Arm, Patient Position: Sitting, Cuff Size: Normal)   Pulse 61   Ht 5\' 8"  (1.727 m)   Wt 155 lb 8 oz (70.5 kg)   BMI 23.64 kg/m  , BMI Body mass index is 23.64 kg/m.  Constitutional:  oriented to person, place, and time. No distress. presenting in a wheelchair HENT:  Head: Normocephalic and atraumatic.  Eyes:  no discharge. No scleral icterus.  Neck: Normal range of motion. Neck supple. No JVD present.  Cardiovascular:RRR; 1+ SEM RSB,  no rubs, or gallops, trace to 1+ pitting bilateralLE edema  Pulmonary/Chest: Effort normal and breath sounds normal. No stridor. No respiratory distress.  no wheezes.  no rales.  no tenderness.  Abdominal: Soft.  no distension.  no tenderness.  Musculoskeletal: Normal range of motion.  no  tenderness or deformity.  Neurological:  normal muscle tone. Coordination normal. No atrophy Skin: Skin is warm and dry. No rash noted. not diaphoretic.  Psychiatric:  normal mood and affect. behavior is normal. Thought content normal.   Recent Labs: 09/11/2017: ALT 20; B Natriuretic Peptide 1,046.0; TSH 7.089  09/12/2017: Hemoglobin 10.4; Platelets 225 09/13/2017: BUN 31; Creatinine, Ser 1.49; Potassium 4.1; Sodium 139    Lipid Panel Lab Results  Component Value Date   CHOL 92 09/13/2017   HDL 44 09/13/2017   LDLCALC 37 09/13/2017   TRIG 57 09/13/2017      Wt Readings from Last 3 Encounters:  10/03/17 155 lb 8 oz (70.5 kg)  09/15/17 155 lb 3.2 oz (70.4 kg)  05/29/17 166 lb 8 oz (75.5 kg)       ASSESSMENT AND PLAN:  Lower extremity edema Recommended compliance with his Lasix, increase Lasix up to 40 mg in the morning 20 mg after lunch. For any weight gain would increase Lasix up to 40 twice a day Previously only taking half dose sometimes bothered by incontinence and urination issues  Chronic diastolic CHF Normal ejection fraction Stressed the importance of staying on his Lasix and calling us for any significant  weight gain 3 pounds or more  Coronary artery disease involving native coronary artery of native heart without angina pectoris - Plan: EKG 12-Lead Currently with no symptoms of angina. No further workup at this time. Continue current medication regimen.  Stable Normal ejection fraction  Encounter for anticoagulation discussion and counseling INR drawn today and adjusted  Type 2 diabetes mellitus with diabetic polyneuropathy, without long-term current use of insulin ([place]) Managed by primary care Slowly losing weight   Paroxysmal atrial fibrillation ([place]) - Plan: EKG 12-Lead Maintaining normal sinus rhythm amiodarone 200 mg daily on anticoagulation stable  Inguinal hernia We have offered referral to surgical service, he has declined at this time Continues to report having pain   Total encounter time more than 45 minutes  Greater than 50% was spent in counseling and coordination of care with the patient  Disposition:   F/U  6 months    Orders Placed This Encounter  Procedures  . EKG 12-Lead     Signed, Esmond Plants, M.D., Ph.D. 10/03/2017  [place], [place]

## 2017-10-03 ENCOUNTER — Ambulatory Visit (INDEPENDENT_AMBULATORY_CARE_PROVIDER_SITE_OTHER): Payer: Medicare Other | Admitting: Cardiovascular Disease

## 2017-10-03 ENCOUNTER — Encounter: Payer: Self-pay | Admitting: Cardiovascular Disease

## 2017-10-03 VITALS — BP 110/60 | HR 61 | Ht 68.0 in | Wt 155.5 lb

## 2017-10-03 DIAGNOSIS — I481 Persistent atrial fibrillation: Secondary | ICD-10-CM

## 2017-10-03 DIAGNOSIS — I480 Paroxysmal atrial fibrillation: Secondary | ICD-10-CM

## 2017-10-03 DIAGNOSIS — Z7189 Other specified counseling: Secondary | ICD-10-CM

## 2017-10-03 DIAGNOSIS — R0602 Shortness of breath: Secondary | ICD-10-CM

## 2017-10-03 DIAGNOSIS — R5382 Chronic fatigue, unspecified: Secondary | ICD-10-CM

## 2017-10-03 DIAGNOSIS — E1142 Type 2 diabetes mellitus with diabetic polyneuropathy: Secondary | ICD-10-CM

## 2017-10-03 DIAGNOSIS — Z7901 Long term (current) use of anticoagulants: Secondary | ICD-10-CM

## 2017-10-03 DIAGNOSIS — I5032 Chronic diastolic (congestive) heart failure: Secondary | ICD-10-CM | POA: Diagnosis not present

## 2017-10-03 DIAGNOSIS — I420 Dilated cardiomyopathy: Secondary | ICD-10-CM

## 2017-10-03 DIAGNOSIS — E440 Moderate protein-calorie malnutrition: Secondary | ICD-10-CM

## 2017-10-03 DIAGNOSIS — I4819 Other persistent atrial fibrillation: Secondary | ICD-10-CM

## 2017-10-03 LAB — POCT INR: INR: 3.2 — AB (ref 2.0–3.0)

## 2017-10-03 MED ORDER — METOPROLOL SUCCINATE ER 25 MG PO TB24: 12.5000 mg | ORAL_TABLET | Freq: Every day | ORAL | 4 refills | Status: DC

## 2017-10-03 MED ORDER — FUROSEMIDE 20 MG PO TABS: 40.0000 mg | ORAL_TABLET | Freq: Two times a day (BID) | ORAL | 3 refills | Status: DC

## 2017-10-03 MED ORDER — LISINOPRIL 5 MG PO TABS: 5.0000 mg | ORAL_TABLET | Freq: Every day | ORAL | 4 refills | Status: DC

## 2017-10-03 MED ORDER — FUROSEMIDE 20 MG PO TABS: ORAL_TABLET | ORAL | Status: DC

## 2017-10-03 NOTE — Patient Instructions (Signed)
Description   Skip today's dose, then change your dose to 1 pill everyday except 1/2 pill on Sundays and Thursdays. Recheck in 2 weeks.

## 2017-10-03 NOTE — Patient Instructions (Addendum)
Medication Instructions:   Goal weight <150 Take furosemide /lasix 2 in the morning, Take one in the afternoon  Labwork:  No new labs needed  Testing/Procedures:  No further testing at this time   Follow-Up: It was a pleasure seeing you in the office today. Please call us if you have new issues that need to be addressed before your next appt.  [phone]  Your physician wants you to follow-up in: 6 months.  You will receive a reminder letter in the mail two months in advance. If you don't receive a letter, please call our office to schedule the follow-up appointment.  If you need a refill on your cardiac medications before your next appointment, please call your pharmacy.  For educational health videos Log in to : [email or web address] Or : [email or web address], password : [email or web address]

## 2017-10-16 ENCOUNTER — Ambulatory Visit (INDEPENDENT_AMBULATORY_CARE_PROVIDER_SITE_OTHER): Payer: Medicare Other

## 2017-10-16 DIAGNOSIS — Z7189 Other specified counseling: Secondary | ICD-10-CM

## 2017-10-16 LAB — POCT INR: INR: 4.7 — AB (ref 2.0–3.0)

## 2017-10-16 NOTE — Patient Instructions (Signed)
Please skip coumadin today & tomorrow, then resume your dose to 1 pill everyday except 1/2 pill on Sundays and Thursdays.  Recheck in 3 weeks.

## 2017-11-04 ENCOUNTER — Telehealth: Payer: Self-pay | Admitting: Cardiovascular Disease

## 2017-11-04 NOTE — Telephone Encounter (Signed)
Tiffany with [facility] calling stating patient has a pressure ulcer on his heel  And he's having issues walking  Tiffany is asking if we can give orders for her to come back on Wednesday to draw patient coumadin   Please advise

## 2017-11-04 NOTE — Telephone Encounter (Signed)
Spoke w/ Tiffany.  Gave verbal order for her to draw pt's INR on Wednesday and call my direct # w/ results. She advised pt that she will be able to do this until his pressure ulcer starts to heal and he is able to leave his home for appts.

## 2017-11-06 ENCOUNTER — Ambulatory Visit (INDEPENDENT_AMBULATORY_CARE_PROVIDER_SITE_OTHER): Payer: Medicare Other

## 2017-11-06 DIAGNOSIS — Z7189 Other specified counseling: Secondary | ICD-10-CM

## 2017-11-06 LAB — POCT INR: INR: 8 — AB (ref 2.0–3.0)

## 2017-11-06 NOTE — Patient Instructions (Signed)
Spoke w/ Jonelle Sidle, [facility] RN w/ Lajean Manes.   She is unable to have venipuncture resulted if she draws today. Advised her to hold coumadin until recheck on Monday, as pt has not been eating.

## 2017-11-13 ENCOUNTER — Ambulatory Visit (INDEPENDENT_AMBULATORY_CARE_PROVIDER_SITE_OTHER): Payer: Medicare Other

## 2017-11-13 DIAGNOSIS — I481 Persistent atrial fibrillation: Secondary | ICD-10-CM

## 2017-11-13 DIAGNOSIS — Z7189 Other specified counseling: Secondary | ICD-10-CM

## 2017-11-13 DIAGNOSIS — Z7901 Long term (current) use of anticoagulants: Secondary | ICD-10-CM

## 2017-11-13 DIAGNOSIS — Z5181 Encounter for therapeutic drug level monitoring: Secondary | ICD-10-CM | POA: Diagnosis not present

## 2017-11-13 DIAGNOSIS — I4819 Other persistent atrial fibrillation: Secondary | ICD-10-CM

## 2017-11-13 LAB — POCT INR
INR: 2.4 (ref 2.0–3.0)
INR: 2.4 (ref 2.0–3.0)

## 2017-11-13 NOTE — Patient Instructions (Signed)
Spoke w/ Jonelle Sidle, [facility] RN w/ Lajean Manes.   She reports pt's INR on Monday was 3.8, but it was after hours when she checked, so she advised pt to hold coumadin until today. Since pt is not eating, advised her to START NEW DOSAGE of 1/2 tablet daily and recheck in 1 week.

## 2017-11-15 ENCOUNTER — Encounter: Payer: Self-pay | Admitting: Internal Medicine

## 2017-11-15 ENCOUNTER — Emergency Department: Payer: Medicare Other

## 2017-11-15 ENCOUNTER — Other Ambulatory Visit: Payer: Self-pay

## 2017-11-15 ENCOUNTER — Inpatient Hospital Stay
Admission: EM | Admit: 2017-11-15 | Discharge: 2017-11-19 | DRG: 871 | Disposition: A | Discharge status: Skilled Nursing Facility | Payer: Medicare Other | Source: Ambulatory Visit | Attending: Internal Medicine | Admitting: Internal Medicine

## 2017-11-15 DIAGNOSIS — Z9114 Patient's other noncompliance with medication regimen: Secondary | ICD-10-CM | POA: Diagnosis not present

## 2017-11-15 DIAGNOSIS — K529 Noninfective gastroenteritis and colitis, unspecified: Secondary | ICD-10-CM | POA: Diagnosis present

## 2017-11-15 DIAGNOSIS — I428 Other cardiomyopathies: Secondary | ICD-10-CM | POA: Diagnosis present

## 2017-11-15 DIAGNOSIS — R571 Hypovolemic shock: Secondary | ICD-10-CM | POA: Diagnosis present

## 2017-11-15 DIAGNOSIS — E875 Hyperkalemia: Secondary | ICD-10-CM | POA: Diagnosis present

## 2017-11-15 DIAGNOSIS — N179 Acute kidney failure, unspecified: Secondary | ICD-10-CM | POA: Diagnosis present

## 2017-11-15 DIAGNOSIS — N183 Chronic kidney disease, stage 3 (moderate): Secondary | ICD-10-CM | POA: Diagnosis present

## 2017-11-15 DIAGNOSIS — Z823 Family history of stroke: Secondary | ICD-10-CM

## 2017-11-15 DIAGNOSIS — Z7901 Long term (current) use of anticoagulants: Secondary | ICD-10-CM | POA: Diagnosis not present

## 2017-11-15 DIAGNOSIS — Z9049 Acquired absence of other specified parts of digestive tract: Secondary | ICD-10-CM | POA: Diagnosis not present

## 2017-11-15 DIAGNOSIS — I130 Hypertensive heart and chronic kidney disease with heart failure and stage 1 through stage 4 chronic kidney disease, or unspecified chronic kidney disease: Secondary | ICD-10-CM | POA: Diagnosis present

## 2017-11-15 DIAGNOSIS — Z87891 Personal history of nicotine dependence: Secondary | ICD-10-CM

## 2017-11-15 DIAGNOSIS — R6521 Severe sepsis with septic shock: Secondary | ICD-10-CM | POA: Diagnosis present

## 2017-11-15 DIAGNOSIS — E785 Hyperlipidemia, unspecified: Secondary | ICD-10-CM | POA: Diagnosis present

## 2017-11-15 DIAGNOSIS — Z7984 Long term (current) use of oral hypoglycemic drugs: Secondary | ICD-10-CM

## 2017-11-15 DIAGNOSIS — L89622 Pressure ulcer of left heel, stage 2: Secondary | ICD-10-CM | POA: Diagnosis present

## 2017-11-15 DIAGNOSIS — E872 Acidosis: Secondary | ICD-10-CM | POA: Diagnosis present

## 2017-11-15 DIAGNOSIS — J439 Emphysema, unspecified: Secondary | ICD-10-CM | POA: Diagnosis present

## 2017-11-15 DIAGNOSIS — M1990 Unspecified osteoarthritis, unspecified site: Secondary | ICD-10-CM | POA: Diagnosis present

## 2017-11-15 DIAGNOSIS — I5042 Chronic combined systolic (congestive) and diastolic (congestive) heart failure: Secondary | ICD-10-CM | POA: Diagnosis present

## 2017-11-15 DIAGNOSIS — Z9884 Bariatric surgery status: Secondary | ICD-10-CM

## 2017-11-15 DIAGNOSIS — Z885 Allergy status to narcotic agent status: Secondary | ICD-10-CM

## 2017-11-15 DIAGNOSIS — Z8546 Personal history of malignant neoplasm of prostate: Secondary | ICD-10-CM | POA: Diagnosis not present

## 2017-11-15 DIAGNOSIS — I480 Paroxysmal atrial fibrillation: Secondary | ICD-10-CM | POA: Diagnosis present

## 2017-11-15 DIAGNOSIS — Z833 Family history of diabetes mellitus: Secondary | ICD-10-CM

## 2017-11-15 DIAGNOSIS — Z825 Family history of asthma and other chronic lower respiratory diseases: Secondary | ICD-10-CM

## 2017-11-15 DIAGNOSIS — R531 Weakness: Secondary | ICD-10-CM

## 2017-11-15 DIAGNOSIS — E1122 Type 2 diabetes mellitus with diabetic chronic kidney disease: Secondary | ICD-10-CM | POA: Diagnosis present

## 2017-11-15 DIAGNOSIS — Z66 Do not resuscitate: Secondary | ICD-10-CM | POA: Diagnosis not present

## 2017-11-15 DIAGNOSIS — N189 Chronic kidney disease, unspecified: Secondary | ICD-10-CM

## 2017-11-15 DIAGNOSIS — Z886 Allergy status to analgesic agent status: Secondary | ICD-10-CM

## 2017-11-15 DIAGNOSIS — I2510 Atherosclerotic heart disease of native coronary artery without angina pectoris: Secondary | ICD-10-CM | POA: Diagnosis present

## 2017-11-15 DIAGNOSIS — A419 Sepsis, unspecified organism: Secondary | ICD-10-CM | POA: Diagnosis present

## 2017-11-15 DIAGNOSIS — Z8249 Family history of ischemic heart disease and other diseases of the circulatory system: Secondary | ICD-10-CM | POA: Diagnosis not present

## 2017-11-15 DIAGNOSIS — Z87442 Personal history of urinary calculi: Secondary | ICD-10-CM

## 2017-11-15 DIAGNOSIS — Z8711 Personal history of peptic ulcer disease: Secondary | ICD-10-CM | POA: Diagnosis not present

## 2017-11-15 DIAGNOSIS — D72825 Bandemia: Secondary | ICD-10-CM

## 2017-11-15 DIAGNOSIS — L8990 Pressure ulcer of unspecified site, unspecified stage: Secondary | ICD-10-CM

## 2017-11-15 DIAGNOSIS — D631 Anemia in chronic kidney disease: Secondary | ICD-10-CM | POA: Diagnosis present

## 2017-11-15 DIAGNOSIS — M109 Gout, unspecified: Secondary | ICD-10-CM | POA: Diagnosis present

## 2017-11-15 DIAGNOSIS — I447 Left bundle-branch block, unspecified: Secondary | ICD-10-CM | POA: Diagnosis present

## 2017-11-15 LAB — URINALYSIS, COMPLETE (UACMP) WITH MICROSCOPIC
Bilirubin Urine: NEGATIVE
GLUCOSE, UA: 50 mg/dL — AB
Ketones, ur: NEGATIVE mg/dL
Nitrite: NEGATIVE
PROTEIN: 30 mg/dL — AB
Specific Gravity, Urine: 1.013 (ref 1.005–1.030)
pH: 5 (ref 5.0–8.0)

## 2017-11-15 LAB — COMPREHENSIVE METABOLIC PANEL
ALK PHOS: 79 U/L (ref 38–126)
ALT: 26 U/L (ref 0–44)
AST: 29 U/L (ref 15–41)
Albumin: 3 g/dL — ABNORMAL LOW (ref 3.5–5.0)
Anion gap: 9 (ref 5–15)
BUN: 48 mg/dL — ABNORMAL HIGH (ref 8–23)
CALCIUM: 8.2 mg/dL — AB (ref 8.9–10.3)
CO2: 16 mmol/L — AB (ref 22–32)
CREATININE: 2.04 mg/dL — AB (ref 0.61–1.24)
Chloride: 109 mmol/L (ref 98–111)
GFR calc Af Amer: 34 mL/min — ABNORMAL LOW (ref >60)
GFR calc non Af Amer: 30 mL/min — ABNORMAL LOW (ref >60)
GLUCOSE: 187 mg/dL — AB (ref 70–99)
Potassium: 5.6 mmol/L — ABNORMAL HIGH (ref 3.5–5.1)
SODIUM: 134 mmol/L — AB (ref 135–145)
Total Bilirubin: 0.8 mg/dL (ref 0.3–1.2)
Total Protein: 6 g/dL — ABNORMAL LOW (ref 6.5–8.1)

## 2017-11-15 LAB — CBC
HCT: 40.6 % (ref 40.0–52.0)
Hemoglobin: 13.1 g/dL (ref 13.0–18.0)
MCH: 28.9 pg (ref 26.0–34.0)
MCHC: 32.2 g/dL (ref 32.0–36.0)
MCV: 89.8 fL (ref 80.0–100.0)
Platelets: 254 10*3/uL (ref 150–440)
RBC: 4.52 MIL/uL (ref 4.40–5.90)
RDW: 22.5 % — ABNORMAL HIGH (ref 11.5–14.5)
WBC: 25.5 10*3/uL — ABNORMAL HIGH (ref 3.8–10.6)

## 2017-11-15 LAB — HEMOGLOBIN A1C
HEMOGLOBIN A1C: 6.6 % — AB (ref 4.8–5.6)
MEAN PLASMA GLUCOSE: 142.72 mg/dL

## 2017-11-15 LAB — CK: Total CK: 101 U/L (ref 49–397)

## 2017-11-15 LAB — PROTIME-INR
INR: 2.07
Prothrombin Time: 23.1 seconds — ABNORMAL HIGH (ref 11.4–15.2)

## 2017-11-15 LAB — GLUCOSE, CAPILLARY: Glucose-Capillary: 81 mg/dL (ref 70–99)

## 2017-11-15 LAB — LIPASE, BLOOD: Lipase: 49 U/L (ref 11–51)

## 2017-11-15 MED ORDER — SODIUM CHLORIDE 0.9 % IV BOLUS
1000.0000 mL | Freq: Once | INTRAVENOUS | Status: AC
  Administered 2017-11-15: 1000 mL via INTRAVENOUS

## 2017-11-15 MED ORDER — INSULIN ASPART 100 UNIT/ML ~~LOC~~ SOLN
0.0000 [IU] | Freq: Every day | SUBCUTANEOUS | Status: DC
  Administered 2017-11-16: 2 [IU] via SUBCUTANEOUS
  Filled 2017-11-15 (×2): qty 1

## 2017-11-15 MED ORDER — WARFARIN SODIUM 2.5 MG PO TABS
2.5000 mg | ORAL_TABLET | Freq: Every day | ORAL | Status: DC
  Filled 2017-11-15 (×2): qty 1

## 2017-11-15 MED ORDER — ENSURE ENLIVE PO LIQD
237.0000 mL | Freq: Two times a day (BID) | ORAL | Status: DC
  Administered 2017-11-16: 237 mL via ORAL

## 2017-11-15 MED ORDER — ONDANSETRON HCL 4 MG PO TABS: 4.0000 mg | ORAL_TABLET | Freq: Four times a day (QID) | ORAL | Status: DC | PRN

## 2017-11-15 MED ORDER — SALINE SPRAY 0.65 % NA SOLN
1.0000 | NASAL | Status: DC | PRN
  Filled 2017-11-15: qty 44

## 2017-11-15 MED ORDER — WARFARIN - PHARMACIST DOSING INPATIENT: Freq: Every day | Status: DC

## 2017-11-15 MED ORDER — ACETAMINOPHEN 500 MG PO TABS
1000.0000 mg | ORAL_TABLET | Freq: Once | ORAL | Status: AC
  Administered 2017-11-15: 1000 mg via ORAL
  Filled 2017-11-15: qty 2

## 2017-11-15 MED ORDER — SODIUM CHLORIDE 0.9 % IV SOLN
INTRAVENOUS | Status: DC
  Administered 2017-11-15 – 2017-11-16 (×3): via INTRAVENOUS

## 2017-11-15 MED ORDER — SODIUM CHLORIDE 0.9 % IV SOLN
INTRAVENOUS | Status: AC
  Administered 2017-11-15: 17:00:00
  Filled 2017-11-15: qty 1

## 2017-11-15 MED ORDER — ACETAMINOPHEN 325 MG PO TABS: 650.0000 mg | ORAL_TABLET | Freq: Four times a day (QID) | ORAL | Status: DC | PRN

## 2017-11-15 MED ORDER — INSULIN ASPART 100 UNIT/ML ~~LOC~~ SOLN
0.0000 [IU] | Freq: Three times a day (TID) | SUBCUTANEOUS | Status: DC
  Administered 2017-11-16: 7 [IU] via SUBCUTANEOUS
  Administered 2017-11-16 – 2017-11-17 (×2): 1 [IU] via SUBCUTANEOUS
  Administered 2017-11-17: 2 [IU] via SUBCUTANEOUS
  Administered 2017-11-17: 3 [IU] via SUBCUTANEOUS
  Administered 2017-11-18: 1 [IU] via SUBCUTANEOUS
  Administered 2017-11-18: 3 [IU] via SUBCUTANEOUS
  Administered 2017-11-18: 1 [IU] via SUBCUTANEOUS
  Filled 2017-11-15 (×8): qty 1

## 2017-11-15 MED ORDER — MOMETASONE FURO-FORMOTEROL FUM 200-5 MCG/ACT IN AERO
2.0000 | INHALATION_SPRAY | Freq: Two times a day (BID) | RESPIRATORY_TRACT | Status: DC
  Administered 2017-11-16 – 2017-11-19 (×7): 2 via RESPIRATORY_TRACT
  Filled 2017-11-15: qty 8.8

## 2017-11-15 MED ORDER — DEXTROSE 50 % IV SOLN
25.0000 mL | Freq: Once | INTRAVENOUS | Status: AC
  Administered 2017-11-15: 25 mL via INTRAVENOUS
  Filled 2017-11-15: qty 50

## 2017-11-15 MED ORDER — ALBUTEROL SULFATE (2.5 MG/3ML) 0.083% IN NEBU
3.0000 mL | INHALATION_SOLUTION | Freq: Four times a day (QID) | RESPIRATORY_TRACT | Status: DC | PRN
Start: 2017-11-15 — End: 2017-11-19

## 2017-11-15 MED ORDER — METOPROLOL SUCCINATE ER 25 MG PO TB24
12.5000 mg | ORAL_TABLET | Freq: Every day | ORAL | Status: DC
  Administered 2017-11-16: 12.5 mg via ORAL
  Filled 2017-11-15 (×2): qty 0.5

## 2017-11-15 MED ORDER — SODIUM BICARBONATE 8.4 % IV SOLN
50.0000 meq | Freq: Once | INTRAVENOUS | Status: AC
  Administered 2017-11-15: 50 meq via INTRAVENOUS
  Filled 2017-11-15: qty 50

## 2017-11-15 MED ORDER — AMIODARONE HCL 200 MG PO TABS
200.0000 mg | ORAL_TABLET | Freq: Every day | ORAL | Status: DC
  Administered 2017-11-16 – 2017-11-18 (×3): 200 mg via ORAL
  Filled 2017-11-15 (×3): qty 1

## 2017-11-15 MED ORDER — CIPROFLOXACIN IN D5W 200 MG/100ML IV SOLN
200.0000 mg | Freq: Two times a day (BID) | INTRAVENOUS | Status: DC
  Administered 2017-11-15 – 2017-11-18 (×6): 200 mg via INTRAVENOUS
  Filled 2017-11-15 (×7): qty 100

## 2017-11-15 MED ORDER — GABAPENTIN 100 MG PO CAPS
100.0000 mg | ORAL_CAPSULE | Freq: Every day | ORAL | Status: DC
  Administered 2017-11-16 – 2017-11-18 (×3): 100 mg via ORAL
  Filled 2017-11-15 (×3): qty 1

## 2017-11-15 MED ORDER — METRONIDAZOLE IN NACL 5-0.79 MG/ML-% IV SOLN
500.0000 mg | Freq: Three times a day (TID) | INTRAVENOUS | Status: DC
  Administered 2017-11-15 – 2017-11-18 (×9): 500 mg via INTRAVENOUS
  Filled 2017-11-15 (×10): qty 100

## 2017-11-15 MED ORDER — INSULIN ASPART 100 UNIT/ML ~~LOC~~ SOLN
10.0000 [IU] | Freq: Once | SUBCUTANEOUS | Status: AC
  Administered 2017-11-15: 10 [IU] via INTRAVENOUS
  Filled 2017-11-15: qty 1

## 2017-11-15 MED ORDER — PIPERACILLIN-TAZOBACTAM 3.375 G IVPB 30 MIN
3.3750 g | Freq: Once | INTRAVENOUS | Status: DC
  Filled 2017-11-15: qty 50

## 2017-11-15 MED ORDER — METRONIDAZOLE IN NACL 5-0.79 MG/ML-% IV SOLN
INTRAVENOUS | Status: AC
  Filled 2017-11-15: qty 100

## 2017-11-15 MED ORDER — HYDROCODONE-ACETAMINOPHEN 5-325 MG PO TABS
1.0000 | ORAL_TABLET | ORAL | Status: DC | PRN
  Administered 2017-11-16 – 2017-11-17 (×2): 1 via ORAL
  Filled 2017-11-15 (×2): qty 1

## 2017-11-15 MED ORDER — SODIUM CHLORIDE 0.9 % IV BOLUS: 500.0000 mL | Freq: Once | INTRAVENOUS | Status: DC

## 2017-11-15 MED ORDER — AMMONIUM LACTATE 12 % EX LOTN
TOPICAL_LOTION | Freq: Two times a day (BID) | CUTANEOUS | Status: DC
  Administered 2017-11-16 – 2017-11-19 (×7): via TOPICAL
  Filled 2017-11-15: qty 400

## 2017-11-15 MED ORDER — SODIUM CHLORIDE 0.9 % IV BOLUS: 1000.0000 mL | Freq: Once | INTRAVENOUS | Status: DC

## 2017-11-15 MED ORDER — ACETAMINOPHEN 650 MG RE SUPP: 650.0000 mg | Freq: Four times a day (QID) | RECTAL | Status: DC | PRN

## 2017-11-15 MED ORDER — ONDANSETRON HCL 4 MG/2ML IJ SOLN: 4.0000 mg | Freq: Four times a day (QID) | INTRAMUSCULAR | Status: DC | PRN

## 2017-11-15 NOTE — H&P (Signed)
[place] [facility] [place] NAME: Gregory Tyler    MR#:  [id number]  DATE OF BIRTH:  1940-10-12  DATE OF ADMISSION:  11/15/2017  PRIMARY CARE PHYSICIAN: [name], MD   REQUESTING/REFERRING PHYSICIAN: Dr Gonzella Lex  CHIEF COMPLAINT:   Chief Complaint  Patient presents with  . Abdominal Pain    HISTORY OF PRESENT ILLNESS:  Gregory Tyler  is a 77 y.o. male stated Dr. Doy Hutching sent him to the hospital.  Patient complaining of 200 out of 10 abdominal pain.  The abdominal pain goes on and off but lasted all night last night.  He had one episode of where he felt like he was going to vomit.  The pain then eases off.  He has been unable to walk for the last 3 or 4 days.  Complains of some back and leg pain.  Had 6 episodes of diarrhea.  He is living by himself since his wife is at [employer].  3 months ago he did have an antibiotic.  Today he forgot to eat breakfast and take his medications.  In the ER, he was found to have an enteritis on CT scan and a white blood cell count of 25,000 so hospitalist services were contacted for further evaluation.  PAST MEDICAL HISTORY:   Past Medical History:  Diagnosis Date  . Anemia   . Anxiety   . Arthritis   . Cardiomyopathy ([place])    a. 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  Marland Kitchen Chronic combined systolic (congestive) and diastolic (congestive) heart failure ([place])    a. 05/2015 TEE: EF 35-40% (in setting of Afib).  . Chronic Pelvic pain   . Coronary artery disease    a. 05/2015 CTA Chest: 3 vessel coronary Ca2+.  . Depression   . Diabetes mellitus without complication ([place])   . Emphysema lung ([place])   . Gastric ulcer   . Gout   . Hyperlipidemia   . Hypertension   . Nephrolithiasis   . PAF (paroxysmal atrial fibrillation) ([place])    a. 05/2015 s/p DCCV; b. 03/2016 s/p DCCV; c. CHA2DS2VASc = 6 (coumadin).  . Prostate cancer ([place])   . Stage III chronic kidney  disease ([place])   . Weight loss    50 lb since Apr 24, 2015    PAST SURGICAL HISTORY:   Past Surgical History:  Procedure Laterality Date  . APPENDECTOMY    . BACK SURGERY    . CHOLECYSTECTOMY    . ELECTROPHYSIOLOGIC STUDY N/A 06/17/2015   Procedure: CARDIOVERSION;  Surgeon: Wellington Hampshire, MD;  Location: [facility] ORS;  Service: Cardiovascular;  Laterality: N/A;  . ELECTROPHYSIOLOGIC STUDY N/A 03/30/2016   Procedure: CARDIOVERSION;  Surgeon: Minna Merritts, MD;  Location: [facility] ORS;  Service: Cardiovascular;  Laterality: N/A;  . GASTRIC BYPASS    . HEMORRHOID SURGERY    . pilonidal cyst    . TEE WITHOUT CARDIOVERSION N/A 06/17/2015   Procedure: TRANSESOPHAGEAL ECHOCARDIOGRAM (TEE);  Surgeon: Wellington Hampshire, MD;  Location: [facility] ORS;  Service: Cardiovascular;  Laterality: N/A;    SOCIAL HISTORY:   Social History   Tobacco Use  . Smoking status: Former Research scientist (life sciences)  . Smokeless tobacco: Current User    Types: Chew  Substance Use Topics  . Alcohol use: No    FAMILY HISTORY:   Family History  Problem Relation Age of Onset  . CAD Mother   . Stroke Mother   . COPD  Mother   . Heart failure Mother   . Diabetes Other     DRUG ALLERGIES:   Allergies  Allergen Reactions  . Aspirin     GI Bleeding  . Codeine Other (See Comments)    BLISTERS Other reaction(s): Other (See Comments) BLISTERS   . Codeine Sulfate Other (See Comments)    Other reaction(s): Other (See Comments) BLISTERS BLISTERS    REVIEW OF SYSTEMS:  CONSTITUTIONAL: No fever, positive for cold feeling and chills.  Positive for fatigue.  EYES: Cannot see out of his right eye and left eye has poor vision.  Has cataracts EARS, NOSE, AND THROAT: Decreased hearing.  Positive for runny nose and postnasal drip.  Positive for food getting caught in his throat.  RESPIRATORY: No cough.  some shortness of breath.  No wheezing or hemoptysis.  CARDIOVASCULAR: No chest pain.   GASTROINTESTINAL: Positive for nausea, vomiting,  diarrhea and abdominal pain. No blood in bowel movements GENITOURINARY: No dysuria, hematuria.  ENDOCRINE: No polyuria, nocturia,  HEMATOLOGY: No anemia, easy bruising or bleeding SKIN: Positive for sore on his feet. MUSCULOSKELETAL: Positive for back pain NEUROLOGIC: No tingling, numbness, weakness.  PSYCHIATRY: No anxiety or depression.   MEDICATIONS AT HOME:   Prior to Admission medications   Medication Sig Start Date End Date Taking? Authorizing Provider  amiodarone (PACERONE) 200 MG tablet TAKE 1 TABLET BY MOUTH ONCE DAILY 09/23/17  Yes Gollan, Kathlene November, MD  atorvastatin (LIPITOR) 20 MG tablet TAKE 1 TABLET BY MOUTH ONCE DAILY 10/02/17  Yes Gollan, Kathlene November, MD  furosemide (LASIX) 20 MG tablet Take 40mg  in the morning and 20 mg in the afternoon 10/03/17  Yes Gollan, Kathlene November, MD  gabapentin (NEURONTIN) 100 MG capsule Take 1 capsule (100 mg total) by mouth at bedtime. 09/15/17  Yes Deundra Bard, MD  lisinopril (PRINIVIL,ZESTRIL) 5 MG tablet Take 1 tablet (5 mg total) by mouth daily. 10/03/17  Yes Gollan, Kathlene November, MD  metFORMIN (GLUCOPHAGE) 500 MG tablet Take 500 mg by mouth 2 (two) times daily with a meal.    Yes [provider]  metoprolol succinate (TOPROL-XL) 25 MG 24 hr tablet Take 0.5 tablets (12.5 mg total) by mouth daily. 10/03/17  Yes Gollan, Kathlene November, MD  sodium chloride (OCEAN) 0.65 % SOLN nasal spray Place 1 spray into both nostrils as needed for congestion.   Yes [provider]  SYMBICORT 160-4.5 MCG/ACT inhaler Inhale 2 puffs into the lungs 2 (two) times daily.  12/21/15  Yes [provider]  warfarin (COUMADIN) 4 MG tablet Take 1 tablet (4 mg total) by mouth daily. Patient taking differently: Take 2.5 mg by mouth daily.  09/15/17 09/15/18 Yes Shourya Macpherson, MD  acetaminophen (TYLENOL) 325 MG tablet Take 2 tablets (650 mg total) by mouth every 6 (six) hours as needed for mild pain (or Fever >/= 101). 09/15/17   Loletha Grayer, MD  albuterol  (PROVENTIL HFA;VENTOLIN HFA) 108 (90 Base) MCG/ACT inhaler Inhale 2 puffs into the lungs every 6 (six) hours as needed for wheezing or shortness of breath. 08/08/15   Minna Merritts, MD  ammonium lactate (LAC-HYDRIN) 12 % lotion Apply twice a day to feet 09/15/17   Loletha Grayer, MD  Elastic Bandages & Supports (MEDICAL COMPRESSION THIGH HIGH) MISC 1 application by Does not apply route daily. 09/11/17   Merlyn Lot, MD  feeding supplement, ENSURE ENLIVE, (ENSURE ENLIVE) LIQD Take 237 mLs by mouth 2 (two) times daily between meals. 09/15/17   Loletha Grayer, MD  HYDROcodone-acetaminophen (NORCO/VICODIN) 5-325 MG tablet Take 1 tablet by mouth every 4 (four) hours as needed for moderate pain.  09/25/17   [provider]      VITAL SIGNS:  Blood pressure 124/83, pulse 85, temperature 98.5 F (36.9 C), temperature source Oral, resp. rate 20, height 5\' 8"  (1.727 m), weight 68.8 kg (151 lb 10.8 oz).  PHYSICAL EXAMINATION:  GENERAL:  77 y.o.-year-old patient lying in the bed with no acute distress.  EYES: Pupils equal, round, reactive to light and accommodation. No scleral icterus. Extraocular muscles intact.  HEENT: Head atraumatic, normocephalic. Oropharynx and nasopharynx clear.  NECK:  Supple, no jugular venous distention. No thyroid enlargement, no tenderness.  LUNGS: Normal breath sounds bilaterally, no wheezing, rales,rhonchi or crepitation. No use of accessory muscles of respiration.  CARDIOVASCULAR: S1, S2 normal. No murmurs, rubs, or gallops.  ABDOMEN: Soft, tender, nondistended. Bowel sounds present. No organomegaly or mass.  EXTREMITIES:  Trace pedal edema. No cyanosis, or clubbing.  NEUROLOGIC: Cranial nerves II through XII are intact. Muscle strength 5/5 in all extremities. Sensation intact. Gait not checked.  PSYCHIATRIC: The patient is alert and oriented x 3.  SKIN: Small left heel decubiti no signs of infection  LABORATORY PANEL:   CBC Recent Labs  Lab  11/15/17 1352  WBC 25.5*  HGB 13.1  HCT 40.6  PLT 254   ------------------------------------------------------------------------------------------------------------------  Chemistries  Recent Labs  Lab 11/15/17 1352  NA 134*  K 5.6*  CL 109  CO2 16*  GLUCOSE 187*  BUN 48*  CREATININE 2.04*  CALCIUM 8.2*  AST 29  ALT 26  ALKPHOS 79  BILITOT 0.8   ------------------------------------------------------------------------------------------------------------------    RADIOLOGY:  Ct Abdomen Pelvis Wo Contrast  Result Date: 11/15/2017 CLINICAL DATA:  Abdominal pain. Swelling in the right lower quadrant/groin. Chronic diarrhea intermittently. History of prostate cancer, kidney stones, diverticulitis, gastric bypass, and cholecystectomy. EXAM: CT ABDOMEN AND PELVIS WITHOUT CONTRAST TECHNIQUE: Multidetector CT imaging of the abdomen and pelvis was performed following the standard protocol without IV contrast. COMPARISON:  CT scan December 08, 2008 FINDINGS: Lower chest: No acute abnormality. Hepatobiliary: Previous cholecystectomy. Limited views of the liver are unremarkable. Pancreas: Unremarkable. No pancreatic ductal dilatation or surrounding inflammatory changes. Spleen: Normal in size without focal abnormality. Adrenals/Urinary Tract: Adrenal glands are normal. No stones in the right kidney. Multiple stones are seen in the left kidney with the largest in the midpole measuring 5.2 mm. No hydronephrosis or perinephric stranding. No identified ureteral stones. The bladder is poorly distended but unremarkable. Stomach/Bowel: Evaluation of the bowel is limited without oral contrast. The distal duodenum is normal. The patient is status post gastric bypass. The small bowel loops are poorly distended limiting evaluation. However, there is fecal material in a distal small bowel loop best seen on coronal image 46. There is also suggested small bowel wall thickening in some locations such as on coronal  is 65 in the left abdomen and coronal image 62 in the pelvis. Colonic diverticulosis is seen without diverticulitis. The appendix is not visualized but there is no secondary evidence of appendicitis. Vascular/Lymphatic: Atherosclerosis is seen in the nonaneurysmal aorta. Atherosclerosis extends into the iliac and femoral vessels. No adenopathy. Reproductive: Previous prostatectomy. Other: Increased attenuation in the intra and extra abdominal fat diffusely consistent with edema. Musculoskeletal: No bony metastatic disease noted. There is scalloping of the superior endplate of L1 which is age indeterminate but favored to be chronic. No definitive acute fracture. IMPRESSION: 1. Small bowel wall thickening. Given the diffuse  edema elsewhere, the small bowel wall thickening could be due to volume overload or hypo proteinuria. Enteritis is not excluded. Recommend clinical correlation. 2. Fecal material in the distal small bowel suggest slow transit. 3. Left renal stones without obstruction. 4. Diverticulosis without diverticulitis. 5. Atherosclerosis throughout the aorta, iliac vessels, and femoral vessels. 6. Increased attenuation in the intra and extra abdominal fat diffusely consistent with edema which could be due to volume overload or hypo proteinuria. 7. Scalloping of the superior endplate of L1 is age indeterminate but may not be acute. Recommend clinical correlation. Electronically Signed   By: Dorise Bullion III M.D   On: 11/15/2017 14:51    EKG:   Atrial fibrillation 85 bpm, left bundle branch block  IMPRESSION AND PLAN:   1.  Acute enteritis, leukocytosis.  Send off stool studies.  Empiric Cipro and Flagyl.  PRN pain medication and nausea medication.  Gentle IV fluid hydration.  Supportive care.  Clear liquid diet. 2.  Acute kidney injury.  Hold nephrotoxic medications.  Gentle IV fluid hydration 3.  Hyperkalemia secondary to acute kidney injury.  Hold any potassium supplementation. 4.  Chronic  atrial fibrillation on Coumadin which is therapeutic.  Continue amiodarone and Toprol 5.  Type 2 diabetes mellitus.  Put on sliding scale and hold Glucophage 6.  History of diastolic congestive heart failure.  Hold diuretics.  Watch closely with IV fluid hydration  7.  Weakness.  Physical therapy evaluation  8.  Hyperlipidemia unspecified on atorvastatin I will hold that also. 9.  Stage II decubiti left heel.  Heel protectors.  Wound care consultation  All the records are reviewed and case discussed with ED provider. Management plans discussed with the patient, family and they are in agreement.  CODE STATUS: Full code  TOTAL TIME TAKING CARE OF THIS PATIENT: 55 minutes including ACP time,    Loletha Grayer M.D on 11/15/2017 at 3:37 PM  Between 7am to 6pm - Pager - [phone]  After 6pm call admission pager [phone]  [facility] Office  [phone]  CC: Primary care physician; [name], MD

## 2017-11-15 NOTE — ED Provider Notes (Signed)
[facility] Emergency Department Provider Note  ____________________________________________  Time seen: Approximately 2:04 PM  I have reviewed the triage vital signs and the nursing notes.   HISTORY  Chief Complaint Abdominal Pain  Level 5 caveat:  Portions of the history and physical were unable to be obtained due to poor historian   HPI Gregory Tyler is a 77 y.o. male with a history of CHF, chronic atrial fibrillation on Coumadin, diabetes, COPD, hypertension, hyperlipidemia, prostate cancer, PUD, gastric bypass, and chronic kidney disease who presents from his primary care doctor's office for evaluation of abdominal pain and generalized weakness.  Patient reports several weeks of intermittent lower sharp abdominal pain.  Patient is a very poor historian.  Also reports testicular swelling for several weeks.  He does have a history of CHF.  He endorses intermittent nausea and diarrhea for the last few days.  He reports being off of his Coumadin for 2 weeks due to supratherapeutic INR.  He denies vomiting, fever, chills, dysuria.  He reports that his pain is in his suprapubic region.  He denies pain at this time.  For the last 24 hours patient has been unable to stand up and walk due to severe generalized weakness.  He lives alone.  He went to see his primary care doctor today and was found to be hypotensive and sent to the emergency room for evaluation.  He denies chest pain, shortness of breath or cough.   Past Medical History:  Diagnosis Date  . Anemia   . Anxiety   . Arthritis   . Cardiomyopathy ([place])    a. 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  Marland Kitchen Chronic combined systolic (congestive) and diastolic (congestive) heart failure ([place])    a. 05/2015 TEE: EF 35-40% (in setting of Afib).  . Chronic Pelvic pain   . Coronary artery disease    a. 05/2015 CTA Chest: 3 vessel coronary Ca2+.  . Depression   . Diabetes mellitus without  complication ([place])   . Emphysema lung ([place])   . Gastric ulcer   . Gout   . Hyperlipidemia   . Hypertension   . Nephrolithiasis   . PAF (paroxysmal atrial fibrillation) ([place])    a. 05/2015 s/p DCCV; b. 03/2016 s/p DCCV; c. CHA2DS2VASc = 6 (coumadin).  . Prostate cancer ([place])   . Stage III chronic kidney disease ([place])   . Weight loss    50 lb since Apr 24, 2015    Patient Active Problem List   Diagnosis Date Noted  . Enteritis 11/15/2017  . Malnutrition of moderate degree 09/13/2017  . Elevated INR 09/02/2017  . Paroxysmal atrial fibrillation ([place]) 10/25/2016  . Chronic fatigue   . Encounter for anticoagulation discussion and counseling 09/21/2015  . Hyperlipidemia 08/08/2015  . Diabetes ([place]) 05/19/2015  . Hyperkalemia   . Dehydration   . Orthostatic hypotension 05/09/2015  . Congestive dilated cardiomyopathy ([place]) 05/09/2015  . Polypharmacy 04/27/2015  . Chronic diastolic CHF (congestive heart failure) ([place]) 04/25/2015  . SOB (shortness of breath)   . Coronary artery disease involving native coronary artery of native heart without angina pectoris   . Essential hypertension     Past Surgical History:  Procedure Laterality Date  . APPENDECTOMY    . BACK SURGERY    . CHOLECYSTECTOMY    . ELECTROPHYSIOLOGIC STUDY N/A 06/17/2015   Procedure: CARDIOVERSION;  Surgeon: Wellington Hampshire, MD;  Location: [facility] ORS;  Service: Cardiovascular;  Laterality: N/A;  .  ELECTROPHYSIOLOGIC STUDY N/A 03/30/2016   Procedure: CARDIOVERSION;  Surgeon: Minna Merritts, MD;  Location: [facility] ORS;  Service: Cardiovascular;  Laterality: N/A;  . GASTRIC BYPASS    . HEMORRHOID SURGERY    . pilonidal cyst    . TEE WITHOUT CARDIOVERSION N/A 06/17/2015   Procedure: TRANSESOPHAGEAL ECHOCARDIOGRAM (TEE);  Surgeon: Wellington Hampshire, MD;  Location: [facility] ORS;  Service: Cardiovascular;  Laterality: N/A;    Prior to Admission medications   Medication Sig Start Date End Date Taking? Authorizing Provider    amiodarone (PACERONE) 200 MG tablet TAKE 1 TABLET BY MOUTH ONCE DAILY 09/23/17  Yes Gollan, Kathlene November, MD  atorvastatin (LIPITOR) 20 MG tablet TAKE 1 TABLET BY MOUTH ONCE DAILY 10/02/17  Yes Gollan, Kathlene November, MD  furosemide (LASIX) 20 MG tablet Take 40mg  in the morning and 20 mg in the afternoon 10/03/17  Yes Gollan, Kathlene November, MD  gabapentin (NEURONTIN) 100 MG capsule Take 1 capsule (100 mg total) by mouth at bedtime. 09/15/17  Yes Wieting, Richard, MD  lisinopril (PRINIVIL,ZESTRIL) 5 MG tablet Take 1 tablet (5 mg total) by mouth daily. 10/03/17  Yes Gollan, Kathlene November, MD  metFORMIN (GLUCOPHAGE) 500 MG tablet Take 500 mg by mouth 2 (two) times daily with a meal.    Yes [provider]  metoprolol succinate (TOPROL-XL) 25 MG 24 hr tablet Take 0.5 tablets (12.5 mg total) by mouth daily. 10/03/17  Yes Gollan, Kathlene November, MD  sodium chloride (OCEAN) 0.65 % SOLN nasal spray Place 1 spray into both nostrils as needed for congestion.   Yes [provider]  SYMBICORT 160-4.5 MCG/ACT inhaler Inhale 2 puffs into the lungs 2 (two) times daily.  12/21/15  Yes [provider]  warfarin (COUMADIN) 4 MG tablet Take 1 tablet (4 mg total) by mouth daily. Patient taking differently: Take 2.5 mg by mouth daily.  09/15/17 09/15/18 Yes Wieting, Richard, MD  acetaminophen (TYLENOL) 325 MG tablet Take 2 tablets (650 mg total) by mouth every 6 (six) hours as needed for mild pain (or Fever >/= 101). 09/15/17   Loletha Grayer, MD  albuterol (PROVENTIL HFA;VENTOLIN HFA) 108 (90 Base) MCG/ACT inhaler Inhale 2 puffs into the lungs every 6 (six) hours as needed for wheezing or shortness of breath. 08/08/15   Minna Merritts, MD  ammonium lactate (LAC-HYDRIN) 12 % lotion Apply twice a day to feet 09/15/17   Loletha Grayer, MD  Elastic Bandages & Supports (MEDICAL COMPRESSION THIGH HIGH) MISC 1 application by Does not apply route daily. 09/11/17   Merlyn Lot, MD  feeding supplement, ENSURE ENLIVE,  (ENSURE ENLIVE) LIQD Take 237 mLs by mouth 2 (two) times daily between meals. 09/15/17   Loletha Grayer, MD  HYDROcodone-acetaminophen (NORCO/VICODIN) 5-325 MG tablet Take 1 tablet by mouth every 4 (four) hours as needed for moderate pain.  09/25/17   [provider]    Allergies Aspirin; Codeine; and Codeine sulfate  Family History  Problem Relation Age of Onset  . CAD Mother   . Stroke Mother   . COPD Mother   . Heart failure Mother   . Diabetes Other     Social History Social History   Tobacco Use  . Smoking status: Former Research scientist (life sciences)  . Smokeless tobacco: Current User    Types: Chew  Substance Use Topics  . Alcohol use: No  . Drug use: No    Review of Systems  Constitutional: Negative for fever. + Generalized weakness Eyes: Negative for visual changes. ENT: Negative for sore  throat. Neck: No neck pain  Cardiovascular: Negative for chest pain. Respiratory: Negative for shortness of breath. Gastrointestinal: + lower abdominal pain, nausea, and diarrhea. Genitourinary: Negative for dysuria. Musculoskeletal: Negative for back pain. Skin: Negative for rash. Neurological: Negative for headaches, weakness or numbness. Psych: No SI or HI  ____________________________________________   PHYSICAL EXAM:  VITAL SIGNS: ED Triage Vitals  Enc Vitals Group     BP 11/15/17 1345 (!) 134/112     Pulse Rate 11/15/17 1345 85     Resp 11/15/17 1345 20     Temp 11/15/17 1345 98.5 F (36.9 C)     Temp Source 11/15/17 1345 Oral     SpO2 --      Weight 11/15/17 1346 151 lb 10.8 oz (68.8 kg)     Height 11/15/17 1346 5\' 8"  (1.727 m)     Head Circumference --      Peak Flow --      Pain Score 11/15/17 1346 5     Pain Loc --      Pain Edu? --      Excl. in [place]? --     Constitutional: Alert and oriented. Well appearing and in no apparent distress. HEENT:      Head: Normocephalic and atraumatic.         Eyes: Conjunctivae are normal. Sclera is non-icteric.        Mouth/Throat: Mucous membranes are moist.       Neck: Supple with no signs of meningismus. Cardiovascular: Regular rate and rhythm. No murmurs, gallops, or rubs. 2+ symmetrical distal pulses are present in all extremities. No JVD. Respiratory: Normal respiratory effort. Lungs are clear to auscultation bilaterally. No wheezes, crackles, or rhonchi.  Gastrointestinal: Soft, tender to palpation diffusely on the lower quadrants, palpable mass on the suprapubic region, bladder scan showed 25cc, and non distended with positive bowel sounds. No rebound or guarding. GU: Bilateral testicles are descended with no tenderness to palpation, bilateral positive cremasteric reflexes are present, diffuse pitting edema of the scrotum. No evidence of inguinal hernia. Musculoskeletal: Nontender with normal range of motion in all extremities. No edema, cyanosis, or erythema of extremities. Neurologic: Normal speech and language. Face is symmetric. Moving all extremities. No gross focal neurologic deficits are appreciated. Skin: Skin is warm, dry and intact. No rash noted. Psychiatric: Mood and affect are normal. Speech and behavior are normal.  ____________________________________________   LABS (all labs ordered are listed, but only abnormal results are displayed)  Labs Reviewed  COMPREHENSIVE METABOLIC PANEL - Abnormal; Notable for the following components:      Result Value   Sodium 134 (*)    Potassium 5.6 (*)    CO2 16 (*)    Glucose, Bld 187 (*)    BUN 48 (*)    Creatinine, Ser 2.04 (*)    Calcium 8.2 (*)    Total Protein 6.0 (*)    Albumin 3.0 (*)    GFR calc non Af Amer 30 (*)    GFR calc Af Amer 34 (*)    All other components within normal limits  CBC - Abnormal; Notable for the following components:   WBC 25.5 (*)    RDW 22.5 (*)    All other components within normal limits  PROTIME-INR - Abnormal; Notable for the following components:   Prothrombin Time 23.1 (*)    All other components  within normal limits  C DIFFICILE QUICK SCREEN W PCR REFLEX  GASTROINTESTINAL PANEL BY PCR, STOOL (REPLACES STOOL CULTURE)  LIPASE, BLOOD  CK  URINALYSIS, COMPLETE (UACMP) WITH MICROSCOPIC  OCCULT BLOOD X 1 CARD TO LAB, STOOL  HEMOGLOBIN A1C   ____________________________________________  EKG  ED ECG REPORT I, Rudene Re, the attending physician, personally viewed and interpreted this ECG.  Normal sinus rhythm with a left bundle branch block, prolonged QTC, normal axis, no ST elevations or depressions.  Unchanged from prior from June 2019  16:48 -sinus tachycardia, rate of 120, left bundle branch block, normal QTC, normal axis, no ST elevations or depressions. ____________________________________________  RADIOLOGY  I have personally reviewed the images performed during this visit and I agree with the Radiologist's read.   Interpretation by Radiologist:  Ct Abdomen Pelvis Wo Contrast  Result Date: 11/15/2017 CLINICAL DATA:  Abdominal pain. Swelling in the right lower quadrant/groin. Chronic diarrhea intermittently. History of prostate cancer, kidney stones, diverticulitis, gastric bypass, and cholecystectomy. EXAM: CT ABDOMEN AND PELVIS WITHOUT CONTRAST TECHNIQUE: Multidetector CT imaging of the abdomen and pelvis was performed following the standard protocol without IV contrast. COMPARISON:  CT scan December 08, 2008 FINDINGS: Lower chest: No acute abnormality. Hepatobiliary: Previous cholecystectomy. Limited views of the liver are unremarkable. Pancreas: Unremarkable. No pancreatic ductal dilatation or surrounding inflammatory changes. Spleen: Normal in size without focal abnormality. Adrenals/Urinary Tract: Adrenal glands are normal. No stones in the right kidney. Multiple stones are seen in the left kidney with the largest in the midpole measuring 5.2 mm. No hydronephrosis or perinephric stranding. No identified ureteral stones. The bladder is poorly distended but  unremarkable. Stomach/Bowel: Evaluation of the bowel is limited without oral contrast. The distal duodenum is normal. The patient is status post gastric bypass. The small bowel loops are poorly distended limiting evaluation. However, there is fecal material in a distal small bowel loop best seen on coronal image 46. There is also suggested small bowel wall thickening in some locations such as on coronal is 65 in the left abdomen and coronal image 62 in the pelvis. Colonic diverticulosis is seen without diverticulitis. The appendix is not visualized but there is no secondary evidence of appendicitis. Vascular/Lymphatic: Atherosclerosis is seen in the nonaneurysmal aorta. Atherosclerosis extends into the iliac and femoral vessels. No adenopathy. Reproductive: Previous prostatectomy. Other: Increased attenuation in the intra and extra abdominal fat diffusely consistent with edema. Musculoskeletal: No bony metastatic disease noted. There is scalloping of the superior endplate of L1 which is age indeterminate but favored to be chronic. No definitive acute fracture. IMPRESSION: 1. Small bowel wall thickening. Given the diffuse edema elsewhere, the small bowel wall thickening could be due to volume overload or hypo proteinuria. Enteritis is not excluded. Recommend clinical correlation. 2. Fecal material in the distal small bowel suggest slow transit. 3. Left renal stones without obstruction. 4. Diverticulosis without diverticulitis. 5. Atherosclerosis throughout the aorta, iliac vessels, and femoral vessels. 6. Increased attenuation in the intra and extra abdominal fat diffusely consistent with edema which could be due to volume overload or hypo proteinuria. 7. Scalloping of the superior endplate of L1 is age indeterminate but may not be acute. Recommend clinical correlation. Electronically Signed   By: Dorise Bullion III M.D   On: 11/15/2017 14:51       ____________________________________________   PROCEDURES  Procedure(s) performed: None Procedures Critical Care performed:  None ____________________________________________   INITIAL IMPRESSION / ASSESSMENT AND PLAN / ED COURSE   77 y.o. male with a history of CHF, chronic atrial fibrillation on Coumadin, diabetes, COPD, hypertension, hyperlipidemia, prostate cancer, PUD, gastric bypass, and chronic kidney disease  who presents from his primary care doctor's office for evaluation of abdominal pain and generalized weakness.  Patient is alert and oriented x3, hemodynamically stable, abdomen has diffuse tenderness on the lower quadrants with a palpable mass in the suprapubic region that initially I thought it was a bladder distention.  Bedside bladder scan showing 25 cc of urine.  Labs and CT scan pending.  Differential diagnoses including but not limited to colitis versus diverticulitis versus constipation versus malignancy versus SBO versus UTI versus pyelonephritis  CT concerning for enteritis. Labs showing WBC 25. Patient given zosyn and IVF. Does not otherwise meet sepsis criteria. Discuss with Dr. Bonita Quin for admission.   Clinical Course as of Nov 16 1650  Fri Nov 15, 2017  1650 Patient now tachycardic. Repeat EKG shows sinus tachycardia with known LBBB. Patient has low grade temp of 70F. Will give another IVF bolus and tylenol. Most likely from the enteritis.    [CV]    Clinical Course User Index [CV] Alfred Levins [place], MD     As part of my medical decision making, I reviewed the following data within the [place] History obtained from family, Nursing notes reviewed and incorporated, Labs reviewed , EKG interpreted , Old chart reviewed, Radiograph reviewed , Discussed with admitting physician , Notes from prior ED visits and [place] Controlled Substance Database    Pertinent labs & imaging results that were available during my care of the patient were reviewed by  me and considered in my medical decision making (see chart for details).    ____________________________________________   FINAL CLINICAL IMPRESSION(S) / ED DIAGNOSES  Final diagnoses:  Acute kidney injury superimposed on chronic kidney disease ([place])  Enteritis  Bandemia  Generalized weakness      NEW MEDICATIONS STARTED DURING THIS VISIT:  ED Discharge Orders    None       Note:  This document was prepared using Dragon voice recognition software and may include unintentional dictation errors.    Alfred Levins, [place], MD 11/15/17 [phone]

## 2017-11-15 NOTE — Progress Notes (Signed)
Patient ID: Gregory Tyler, male   DOB: 1940/10/27, 77 y.o.   MRN: [id number]  ACP note  Patient and son at the bedside  Diagnosis: Acute enteritis with leukocytosis, acute kidney injury, hyperkalemia, chronic atrial fibrillation, type 2 diabetes mellitus, history of diastolic congestive heart failure, weakness, hyperlipidemia, stage II decubiti left heel  CODE STATUS discussed.  Patient wants to be a full code at this point in time  Plan.  Supportive care with enteritis and send off stool studies.  Empiric Cipro and Flagyl.  IV fluid hydration with acute kidney injury.  Continue to follow closely.  Time spent on ACP discussion 17 minutes Dr. Loletha Grayer

## 2017-11-15 NOTE — Progress Notes (Signed)
ANTICOAGULATION CONSULT NOTE - Initial Consult  Pharmacy Consult for continuation of Warfarin Indication: atrial fibrillation  Allergies  Allergen Reactions  . Aspirin     GI Bleeding  . Codeine Other (See Comments)    BLISTERS Other reaction(s): Other (See Comments) BLISTERS   . Codeine Sulfate Other (See Comments)    Other reaction(s): Other (See Comments) BLISTERS BLISTERS    Patient Measurements: Height: 5\' 8"  (172.7 cm) Weight: 151 lb 10.8 oz (68.8 kg) IBW/kg (Calculated) : 68.4 Heparin Dosing Weight:    Vital Signs: Temp: 98.5 F (36.9 C) (07/26 1345) Temp Source: Oral (07/26 1345) BP: 129/69 (07/26 1400) Pulse Rate: 85 (07/26 1345)  Labs: Recent Labs    11/13/17 11/13/17 1659 11/15/17 1352 11/15/17 1436  HGB  --   --  13.1  --   HCT  --   --  40.6  --   PLT  --   --  254  --   LABPROT  --   --   --  23.1*  INR 2.4 2.4  --  2.07  CREATININE  --   --  2.04*  --     Estimated Creatinine Clearance: 29.3 mL/min (A) (by C-G formula based on SCr of 2.04 mg/dL (H)).   Medical History: Past Medical History:  Diagnosis Date  . Anemia   . Anxiety   . Arthritis   . Cardiomyopathy ([place])    a. 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  Marland Kitchen Chronic combined systolic (congestive) and diastolic (congestive) heart failure ([place])    a. 05/2015 TEE: EF 35-40% (in setting of Afib).  . Chronic Pelvic pain   . Coronary artery disease    a. 05/2015 CTA Chest: 3 vessel coronary Ca2+.  . Depression   . Diabetes mellitus without complication ([place])   . Emphysema lung ([place])   . Gastric ulcer   . Gout   . Hyperlipidemia   . Hypertension   . Nephrolithiasis   . PAF (paroxysmal atrial fibrillation) ([place])    a. 05/2015 s/p DCCV; b. 03/2016 s/p DCCV; c. CHA2DS2VASc = 6 (coumadin).  . Prostate cancer ([place])   . Stage III chronic kidney disease ([place])   . Weight loss    50 lb since Apr 24, 2015    Medications:  Patient states that he is taking Warfarin 2.5mg   daily (just restarted on Wednesday)  Assessment: Patient is a 77yo male being admitted for acute enteritis. Pharmacy is consulted for management of Warfarin. INR on admission is 2.07. Noted that patient will be receiving Cipro and Flagyl.  Goal of Therapy:  INR 2-3 Monitor platelets by anticoagulation protocol: Yes   Plan:  Patient is currently therapeutic on home dose of Warfarin 2.5mg  daily. Will continue this dose for now but anticipate that dose will need to be decreased if he continues on Cipro. Will check daily INR.   Paulina Fusi, PharmD, BCPS 11/15/2017 4:01 PM

## 2017-11-15 NOTE — ED Triage Notes (Addendum)
Pt here with lower abd pain, worse recently, states that the rlq is swollen and pt is touching his groin area. Pt also states that he may have an infection in his feet, family remember states that a home health nurse has been coming out to check his feet and reports he has a hx of diabetes. Pt was sent over from Dr Doy Hutching office with low bp. Pt is axox4. Pt reports diarrhea for his entire life, states that he will have it for 3-4 days then not

## 2017-11-16 DIAGNOSIS — L8990 Pressure ulcer of unspecified site, unspecified stage: Secondary | ICD-10-CM

## 2017-11-16 DIAGNOSIS — R6521 Severe sepsis with septic shock: Secondary | ICD-10-CM

## 2017-11-16 DIAGNOSIS — A419 Sepsis, unspecified organism: Principal | ICD-10-CM

## 2017-11-16 LAB — CBC
HEMATOCRIT: 36.5 % — AB (ref 40.0–52.0)
HEMOGLOBIN: 11.9 g/dL — AB (ref 13.0–18.0)
MCH: 28.7 pg (ref 26.0–34.0)
MCHC: 32.5 g/dL (ref 32.0–36.0)
MCV: 88.2 fL (ref 80.0–100.0)
Platelets: 187 10*3/uL (ref 150–440)
RBC: 4.14 MIL/uL — AB (ref 4.40–5.90)
RDW: 21.8 % — AB (ref 11.5–14.5)
WBC: 20.1 10*3/uL — AB (ref 3.8–10.6)

## 2017-11-16 LAB — C DIFFICILE QUICK SCREEN W PCR REFLEX
C DIFFICILE (CDIFF) INTERP: NOT DETECTED
C DIFFICILE (CDIFF) TOXIN: NEGATIVE
C Diff antigen: NEGATIVE

## 2017-11-16 LAB — BASIC METABOLIC PANEL
ANION GAP: 5 (ref 5–15)
BUN: 47 mg/dL — AB (ref 8–23)
CO2: 17 mmol/L — ABNORMAL LOW (ref 22–32)
Calcium: 7.2 mg/dL — ABNORMAL LOW (ref 8.9–10.3)
Chloride: 116 mmol/L — ABNORMAL HIGH (ref 98–111)
Creatinine, Ser: 1.84 mg/dL — ABNORMAL HIGH (ref 0.61–1.24)
GFR, EST AFRICAN AMERICAN: 39 mL/min — AB (ref >60)
GFR, EST NON AFRICAN AMERICAN: 34 mL/min — AB (ref >60)
Glucose, Bld: 85 mg/dL (ref 70–99)
POTASSIUM: 4.3 mmol/L (ref 3.5–5.1)
SODIUM: 138 mmol/L (ref 135–145)

## 2017-11-16 LAB — GASTROINTESTINAL PANEL BY PCR, STOOL (REPLACES STOOL CULTURE)

## 2017-11-16 LAB — GLUCOSE, CAPILLARY
GLUCOSE-CAPILLARY: 55 mg/dL — AB (ref 70–99)
GLUCOSE-CAPILLARY: 127 mg/dL — AB (ref 70–99)
GLUCOSE-CAPILLARY: 215 mg/dL — AB (ref 70–99)
Glucose-Capillary: 81 mg/dL (ref 70–99)
Glucose-Capillary: 113 mg/dL — ABNORMAL HIGH (ref 70–99)
Glucose-Capillary: 327 mg/dL — ABNORMAL HIGH (ref 70–99)

## 2017-11-16 LAB — MRSA PCR SCREENING: MRSA BY PCR: NEGATIVE

## 2017-11-16 LAB — PROTIME-INR
INR: 2.49
Prothrombin Time: 26.7 seconds — ABNORMAL HIGH (ref 11.4–15.2)

## 2017-11-16 LAB — OCCULT BLOOD X 1 CARD TO LAB, STOOL: Fecal Occult Bld: NEGATIVE

## 2017-11-16 MED ORDER — SODIUM CHLORIDE 0.9 % IV BOLUS
1000.0000 mL | Freq: Once | INTRAVENOUS | Status: AC
  Administered 2017-11-16: 1000 mL via INTRAVENOUS

## 2017-11-16 MED ORDER — DEXTROSE-NACL 5-0.45 % IV SOLN
INTRAVENOUS | Status: DC
  Administered 2017-11-16 – 2017-11-17 (×2): via INTRAVENOUS

## 2017-11-16 MED ORDER — SODIUM CHLORIDE 0.9 % IV SOLN
INTRAVENOUS | Status: DC | PRN
  Administered 2017-11-16 – 2017-11-17 (×2): via INTRAVENOUS

## 2017-11-16 MED ORDER — SODIUM CHLORIDE 0.9 % IV SOLN: Freq: Once | INTRAVENOUS | Status: DC

## 2017-11-16 MED ORDER — DEXTROSE 50 % IV SOLN
INTRAVENOUS | Status: AC
  Filled 2017-11-16: qty 50

## 2017-11-16 MED ORDER — WARFARIN SODIUM 2.5 MG PO TABS: 2.5000 mg | ORAL_TABLET | Freq: Every day | ORAL | Status: DC

## 2017-11-16 MED ORDER — DEXTROSE 50 % IV SOLN
1.0000 | Freq: Once | INTRAVENOUS | Status: AC
  Administered 2017-11-16: 25 mL via INTRAVENOUS

## 2017-11-16 MED ORDER — PHENYLEPHRINE HCL 10 MG/ML IJ SOLN
0.0000 ug/min | Freq: Once | INTRAVENOUS | Status: DC
  Filled 2017-11-16: qty 1

## 2017-11-16 MED ORDER — WARFARIN 0.5 MG HALF TABLET
1.5000 mg | ORAL_TABLET | Freq: Once | ORAL | Status: AC
  Administered 2017-11-16: 1.5 mg via ORAL
  Filled 2017-11-16: qty 1

## 2017-11-16 MED ORDER — SODIUM CHLORIDE 0.9 % IV BOLUS: 500.0000 mL | Freq: Once | INTRAVENOUS | Status: DC

## 2017-11-16 MED ORDER — PNEUMOCOCCAL VAC POLYVALENT 25 MCG/0.5ML IJ INJ: 0.5000 mL | INJECTION | INTRAMUSCULAR | Status: DC

## 2017-11-16 MED ORDER — PHENYLEPHRINE HCL-NACL 10-0.9 MG/250ML-% IV SOLN
0.0000 ug/min | INTRAVENOUS | Status: DC
  Administered 2017-11-17: 20 ug/min via INTRAVENOUS
  Filled 2017-11-16: qty 250

## 2017-11-16 MED ORDER — DEXTROSE 5 % AND 0.45 % NACL IV BOLUS
500.0000 mL | Freq: Once | INTRAVENOUS | Status: AC
  Administered 2017-11-16: 500 mL via INTRAVENOUS

## 2017-11-16 NOTE — Progress Notes (Signed)
eLink Physician-Brief Progress Note Patient Name: Gregory Tyler DOB: 1940/10/03 MRN: [id number]   Date of Service  11/16/2017  HPI/Events of Note  Pt was admitted over night with severe enteritis and hypovolemic shock. He is significantly volume depleted. K+ 5.6  eICU Interventions  1000 ml 0.9 % Saline bolus over 1 hour.        [name] [name] 11/16/2017, 2:19 AM

## 2017-11-16 NOTE — ED Notes (Signed)
Pt back to room and placed on monitor at this time

## 2017-11-16 NOTE — NC FL2 (Addendum)
[place] LEVEL OF CARE SCREENING TOOL     IDENTIFICATION  Patient Name: Gregory Tyler Birthdate: 1940-06-23 Sex: male Admission Date (Current Location): 11/15/2017  [place] and [place] Number:  Engineering geologist and Address:  [facility], [address], [place], [place] [address]      Provider Number: [id number]  Attending Physician Name and Address:  Saundra Shelling, MD  Relative Name and Phone Number:  Caylin Raby- son [phone]    Current Level of Care: Hospital Recommended Level of Care: [place] Prior Approval Number:    Date Approved/Denied:   PASRR Number:   [id number]  Discharge Plan: SNF    Current Diagnoses: Patient Active Problem List   Diagnosis Date Noted  . Pressure injury of skin 11/16/2017  . Enteritis 11/15/2017  . Malnutrition of moderate degree 09/13/2017  . Elevated INR 09/02/2017  . Paroxysmal atrial fibrillation ([place]) 10/25/2016  . Chronic fatigue   . Encounter for anticoagulation discussion and counseling 09/21/2015  . Hyperlipidemia 08/08/2015  . Diabetes ([place]) 05/19/2015  . Hyperkalemia   . Dehydration   . Orthostatic hypotension 05/09/2015  . Congestive dilated cardiomyopathy ([place]) 05/09/2015  . Polypharmacy 04/27/2015  . Chronic diastolic CHF (congestive heart failure) ([place]) 04/25/2015  . SOB (shortness of breath)   . Coronary artery disease involving native coronary artery of native heart without angina pectoris   . Essential hypertension     Orientation RESPIRATION BLADDER Height & Weight     Self, Time, Place  O2(2 liters ) Incontinent Weight: 151 lb 10.8 oz (68.8 kg) Height:  5\' 8"  (172.7 cm)  BEHAVIORAL SYMPTOMS/MOOD NEUROLOGICAL BOWEL NUTRITION STATUS  (none) (none) Continent Diet(Regular Diet )  AMBULATORY STATUS COMMUNICATION OF NEEDS Skin   Extensive Assist Verbally Other (Comment)(Stage 2 on left heel )                       Personal Care  Assistance Level of Assistance  Bathing, Feeding, Dressing Bathing Assistance: Limited assistance Feeding assistance: Independent Dressing Assistance: Limited assistance     Functional Limitations Info  Sight, Hearing, Speech Sight Info: Adequate Hearing Info: Adequate Speech Info: Adequate    SPECIAL CARE FACTORS FREQUENCY  PT (By licensed PT), OT (By licensed OT)     PT Frequency: 5 OT Frequency: 5            Contractures Contractures Info: Not present    Additional Factors Info  Code Status, Allergies Code Status Info: Full Code  Allergies Info: Aspirin, Codeine, Codeine Sulfate           Current Medications (11/16/2017):  This is the current hospital active medication list Current Facility-Administered Medications  Medication Dose Route Frequency Provider Last Rate Last Dose  . 0.9 %  sodium chloride infusion   Intravenous PRN Tukov-Yual, Magdalene S, NP 10 mL/hr at 11/16/17 0322    . acetaminophen (TYLENOL) tablet 650 mg  650 mg Oral Q6H PRN Loletha Grayer, MD       Or  . acetaminophen (TYLENOL) suppository 650 mg  650 mg Rectal Q6H PRN Wieting, Richard, MD      . albuterol (PROVENTIL) (2.5 MG/3ML) 0.083% nebulizer solution 3 mL  3 mL Inhalation Q6H PRN Wieting, Richard, MD      . amiodarone (PACERONE) tablet 200 mg  200 mg Oral Daily Loletha Grayer, MD   200 mg at 11/16/17 0950  . ammonium lactate (LAC-HYDRIN) 12 % lotion   Topical BID Wieting,  Richard, MD      . ciprofloxacin (CIPRO) IVPB 200 mg  200 mg Intravenous Q12H Loletha Grayer, MD 100 mL/hr at 11/16/17 1500 200 mg at 11/16/17 1500  . dextrose 5 %-0.45 % sodium chloride infusion   Intravenous Continuous Conforti, John, DO 50 mL/hr at 11/16/17 1231    . dextrose 50 % solution           . feeding supplement (ENSURE ENLIVE) (ENSURE ENLIVE) liquid 237 mL  237 mL Oral BID BM Loletha Grayer, MD   237 mL at 11/16/17 0951  . gabapentin (NEURONTIN) capsule 100 mg  100 mg Oral QHS Wieting, Richard, MD       . HYDROcodone-acetaminophen (NORCO/VICODIN) 5-325 MG per tablet 1 tablet  1 tablet Oral Q4H PRN Wieting, Richard, MD      . insulin aspart (novoLOG) injection 0-5 Units  0-5 Units Subcutaneous QHS Wieting, Richard, MD      . insulin aspart (novoLOG) injection 0-9 Units  0-9 Units Subcutaneous TID WC Loletha Grayer, MD   1 Units at 11/16/17 1232  . metoprolol succinate (TOPROL-XL) 24 hr tablet 12.5 mg  12.5 mg Oral Daily Loletha Grayer, MD   12.5 mg at 11/16/17 0950  . metroNIDAZOLE (FLAGYL) IVPB 500 mg  500 mg Intravenous Q8H Loletha Grayer, MD   Stopped at 11/16/17 1352  . mometasone-formoterol (DULERA) 200-5 MCG/ACT inhaler 2 puff  2 puff Inhalation BID Loletha Grayer, MD   2 puff at 11/16/17 0807  . ondansetron (ZOFRAN) tablet 4 mg  4 mg Oral Q6H PRN Loletha Grayer, MD       Or  . ondansetron (ZOFRAN) injection 4 mg  4 mg Intravenous Q6H PRN Wieting, Richard, MD      . phenylephrine (NEOSYNEPHRINE) 10-0.9 MG/250ML-% infusion  0-400 mcg/min Intravenous Titrated Tukov-Yual, Magdalene S, NP      . [START ON 11/17/2017] pneumococcal 23 valent vaccine (PNU-IMMUNE) injection 0.5 mL  0.5 mL Intramuscular Tomorrow-1000 Tukov-Yual, Magdalene S, NP      . sodium chloride (OCEAN) 0.65 % nasal spray 1 spray  1 spray Each Nare PRN Wieting, Richard, MD      . warfarin (COUMADIN) tablet 1.5 mg  1.5 mg Oral ONCE-1800 Larene Beach, RPH      . [START ON 11/17/2017] warfarin (COUMADIN) tablet 2.5 mg  2.5 mg Oral q1800 Larene Beach, [facility]      . Warfarin - Pharmacist Dosing Inpatient   Does not apply q1800 Loletha Grayer, MD         Discharge Medications: Please see discharge summary for a list of discharge medications.  Relevant Imaging Results:  Relevant Lab Results:   Additional Information SSN [id number]        Annamaria Boots, [place]

## 2017-11-16 NOTE — Progress Notes (Signed)
[place] at [place] NAME: Gregory Tyler    MR#:  [id number]  DATE OF BIRTH:  06-28-1940  SUBJECTIVE:  CHIEF COMPLAINT:   Chief Complaint  Patient presents with  . Abdominal Pain  Patient seen and evaluated today Has decreased abdominal pain No diarrhea  decreased nausea Not on any IV pressor medication   REVIEW OF SYSTEMS:    ROS  CONSTITUTIONAL: No documented fever. No fatigue, weakness. No weight gain, no weight loss.  EYES: No blurry or double vision.  ENT: No tinnitus. No postnasal drip. No redness of the oropharynx.  RESPIRATORY: No cough, no wheeze, no hemoptysis. No dyspnea.  CARDIOVASCULAR: No chest pain. No orthopnea. No palpitations. No syncope.  GASTROINTESTINAL: No nausea, no vomiting, had diarrhea. Had abdominal pain. No melena or hematochezia.  GENITOURINARY: No dysuria or hematuria.  ENDOCRINE: No polyuria or nocturia. No heat or cold intolerance.  HEMATOLOGY: No anemia. No bruising. No bleeding.  INTEGUMENTARY: No rashes. No lesions.  MUSCULOSKELETAL: No arthritis. No swelling. No gout.  NEUROLOGIC: No numbness, tingling, or ataxia. No seizure-type activity.  PSYCHIATRIC: No anxiety. No insomnia. No ADD.   DRUG ALLERGIES:   Allergies  Allergen Reactions  . Aspirin     GI Bleeding  . Codeine Other (See Comments)    BLISTERS Other reaction(s): Other (See Comments) BLISTERS   . Codeine Sulfate Other (See Comments)    Other reaction(s): Other (See Comments) BLISTERS BLISTERS    VITALS:  Blood pressure 111/65, pulse 78, temperature (!) 97.3 F (36.3 C), temperature source Oral, resp. rate 20, height 5\' 8"  (1.727 m), weight 68.8 kg (151 lb 10.8 oz), SpO2 98 %.  PHYSICAL EXAMINATION:   Physical Exam  GENERAL:  77 y.o.-year-old patient lying in the bed with no acute distress.  EYES: Pupils equal, round, reactive to light and accommodation. No scleral icterus. Extraocular muscles intact.  HEENT: Head  atraumatic, normocephalic. Oropharynx and nasopharynx clear.  NECK:  Supple, no jugular venous distention. No thyroid enlargement, no tenderness.  LUNGS: Normal breath sounds bilaterally, no wheezing, rales, rhonchi. No use of accessory muscles of respiration.  CARDIOVASCULAR: S1, S2 normal. No murmurs, rubs, or gallops.  ABDOMEN: Soft, nontender, nondistended. Bowel sounds present. No organomegaly or mass.  EXTREMITIES: No cyanosis, clubbing or edema b/l.    NEUROLOGIC: Cranial nerves II through XII are intact. No focal Motor or sensory deficits b/l.   PSYCHIATRIC: The patient is alert and oriented x 3.  SKIN: No obvious rash, lesion, or ulcer.   LABORATORY PANEL:   CBC Recent Labs  Lab 11/16/17 0443  WBC 20.1*  HGB 11.9*  HCT 36.5*  PLT 187   ------------------------------------------------------------------------------------------------------------------ Chemistries  Recent Labs  Lab 11/15/17 1352 11/16/17 0443  NA 134* 138  K 5.6* 4.3  CL 109 116*  CO2 16* 17*  GLUCOSE 187* 85  BUN 48* 47*  CREATININE 2.04* 1.84*  CALCIUM 8.2* 7.2*  AST 29  --   ALT 26  --   ALKPHOS 79  --   BILITOT 0.8  --    ------------------------------------------------------------------------------------------------------------------  Cardiac Enzymes No results for input(s): TROPONINI in the last 168 hours. ------------------------------------------------------------------------------------------------------------------  RADIOLOGY:  Ct Abdomen Pelvis Wo Contrast  Result Date: 11/15/2017 CLINICAL DATA:  Abdominal pain. Swelling in the right lower quadrant/groin. Chronic diarrhea intermittently. History of prostate cancer, kidney stones, diverticulitis, gastric bypass, and cholecystectomy. EXAM: CT ABDOMEN AND PELVIS WITHOUT CONTRAST TECHNIQUE: Multidetector CT imaging of the abdomen and pelvis was performed following  the standard protocol without IV contrast. COMPARISON:  CT scan December 08, 2008 FINDINGS: Lower chest: No acute abnormality. Hepatobiliary: Previous cholecystectomy. Limited views of the liver are unremarkable. Pancreas: Unremarkable. No pancreatic ductal dilatation or surrounding inflammatory changes. Spleen: Normal in size without focal abnormality. Adrenals/Urinary Tract: Adrenal glands are normal. No stones in the right kidney. Multiple stones are seen in the left kidney with the largest in the midpole measuring 5.2 mm. No hydronephrosis or perinephric stranding. No identified ureteral stones. The bladder is poorly distended but unremarkable. Stomach/Bowel: Evaluation of the bowel is limited without oral contrast. The distal duodenum is normal. The patient is status post gastric bypass. The small bowel loops are poorly distended limiting evaluation. However, there is fecal material in a distal small bowel loop best seen on coronal image 46. There is also suggested small bowel wall thickening in some locations such as on coronal is 65 in the left abdomen and coronal image 62 in the pelvis. Colonic diverticulosis is seen without diverticulitis. The appendix is not visualized but there is no secondary evidence of appendicitis. Vascular/Lymphatic: Atherosclerosis is seen in the nonaneurysmal aorta. Atherosclerosis extends into the iliac and femoral vessels. No adenopathy. Reproductive: Previous prostatectomy. Other: Increased attenuation in the intra and extra abdominal fat diffusely consistent with edema. Musculoskeletal: No bony metastatic disease noted. There is scalloping of the superior endplate of L1 which is age indeterminate but favored to be chronic. No definitive acute fracture. IMPRESSION: 1. Small bowel wall thickening. Given the diffuse edema elsewhere, the small bowel wall thickening could be due to volume overload or hypo proteinuria. Enteritis is not excluded. Recommend clinical correlation. 2. Fecal material in the distal small bowel suggest slow transit. 3. Left renal  stones without obstruction. 4. Diverticulosis without diverticulitis. 5. Atherosclerosis throughout the aorta, iliac vessels, and femoral vessels. 6. Increased attenuation in the intra and extra abdominal fat diffusely consistent with edema which could be due to volume overload or hypo proteinuria. 7. Scalloping of the superior endplate of L1 is age indeterminate but may not be acute. Recommend clinical correlation. Electronically Signed   By: Dorise Bullion III M.D   On: 11/15/2017 14:51     ASSESSMENT AND PLAN:   77 year old male patient with history of coronary artery disease, type 2 diabetes mellitus, emphysema of lung, gout, hyperlipidemia, hypertension, systolic and diastolic heart failure currently under hospitalist service for gastroenteritis, dehydration  -Acute gastroenteritis improving IV fluid hydration Advance diet  -Acute kidney injury secondary to dehydration Improving with IV fluids  -Hypotension improved with IV fluids  -Chronic atrial fibrillation on anticoagulation with Coumadin Continue amiodarone and Toprol for rate control  -Stable chronic combined systolic and diastolic heart failure  -Hyperlipidemia continue statin medication  -Stage II decubitus left heel Follow-up with wound care and heel protectors to continue  -Type 2 diabetes mellitus  diabetic diet with sliding scale coverage with insulin  -Patient will be transferred to medical floor today   All the records are reviewed and case discussed with Care Management/Social Worker. Management plans discussed with the patient, family and they are in agreement.  CODE STATUS: Full code  DVT Prophylaxis: SCDs  TOTAL TIME TAKING CARE OF THIS PATIENT: 34 minutes.   POSSIBLE D/C IN 2 to 3 DAYS, DEPENDING ON CLINICAL CONDITION.  Saundra Shelling M.D on 11/16/2017 at 11:54 AM  Between 7am to 6pm - Pager - [phone]  After 6pm go to [email or web address] - password [email or web address] [facility]  Office   [phone]  CC: Primary care physician; [name], MD  Note: This dictation was prepared with Dragon dictation along with smaller phrase technology. Any transcriptional errors that result from this process are unintentional.

## 2017-11-16 NOTE — Evaluation (Signed)
Physical Therapy Evaluation Patient Details Name: Gregory Tyler MRN: [id number] DOB: [birth year] Today's Date: 11/16/2017   History of Present Illness  Pt is a 77 year old male admitted for enteritis s/p c/o abdominal pain.  Pt also has a stage 2 ulcer on L heel.  PMH includes stage III CKD, prostate CA, PAF, gout, cardiomyopathy and chronic pelvic pain.  Clinical Impression  Patient is a 77 year old male who lives in an ALF and is ambulatory with a RW at baseline.  He is in bed upon PT arrival and requires mod-total assistance for all bed mobility.  Pt unable to sit at EOB for MMT due to O2 desat to 70% range.  Pt recovered within 10 min of returning to supine.  PT and pt's son assisted pt in positioning in bed and PT introduced supine exercises to pt, instructing him to perform exercises between therapy sessions.  He presents with generalized weakness of bilateral UE and L LE weakness compared to R LE.  Pt reports no sensation loss.  Pt able to complete there ex with VC's for proper form and some manual assistance due to apprehension and weakness on L LE and pain in R hip.  Pt will continue to benefit from skilled PT with focus on strength, pain management, tolerance to activity and safe mobility with proper AD.  He will benefit from a skilled nursing setting at this time due to above stated deficits.    Follow Up Recommendations SNF    Equipment Recommendations  None recommended by PT(TBD at next venue of care.  Pt expressing interest in [facility].)    Recommendations for Other Services       Precautions / Restrictions Precautions Precautions: Fall Restrictions Weight Bearing Restrictions: No(Per RN, no WB restrictions.)      Mobility  Bed Mobility Overal bed mobility: Needs Assistance Bed Mobility: Supine to Sit;Sit to Supine     Supine to sit: Max assist Sit to supine: Max assist   General bed mobility comments: Assistance to bring LE's over EOB and sit upright with hand held  assist.  Desat into 70% range when sitting so pt was assisted back to supine.  Pt's son assisted PT in scooting pt up in bed.  PT provided education regarding body mechanics for efficient bed mobility.  Transfers Overall transfer level: (Deferred due to O2 desaturation.)                  Ambulation/Gait                Stairs            Wheelchair Mobility    Modified Rankin (Stroke Patients Only)       Balance Overall balance assessment: Mild deficits observed, not formally tested                                           Pertinent Vitals/Pain Pain Assessment: Faces Faces Pain Scale: Hurts little more Pain Location: L heel Pain Descriptors / Indicators: Aching Pain Intervention(s): Limited activity within patient's tolerance    Home Living Family/patient expects to be discharged to:: Skilled nursing facility Living Arrangements: Alone                    Prior Function Level of Independence: Needs assistance   Gait / Transfers Assistance Needed: Uses RW for ambulation but  states that he has not been able to walk as much lately.           Hand Dominance        Extremity/Trunk Assessment   Upper Extremity Assessment Upper Extremity Assessment: Generalized weakness    Lower Extremity Assessment Lower Extremity Assessment: Generalized weakness;LLE deficits/detail LLE Deficits / Details: L LE presents with weakness compared to R LE, possibly due to hx of CVA or apprehension, per RN. LLE: Unable to fully assess due to pain LLE Sensation: WNL    Cervical / Trunk Assessment Cervical / Trunk Assessment: Kyphotic  Communication   Communication: HOH  Cognition Arousal/Alertness: Awake/alert Behavior During Therapy: Restless Overall Cognitive Status: Within Functional Limits for tasks assessed                                 General Comments: Alert to self and situation.  Follows commands consistently  but very apprehensive. Perseverates on needing to go home and making sure his wife, who has just been placed in assisted living, is taken care of.      General Comments      Exercises General Exercises - Lower Extremity Quad Sets: Strengthening;Both;10 reps;Supine Short Arc Quad: Strengthening;Both;10 reps;Supine Hip ABduction/ADduction: AAROM;Strengthening;Both;10 reps;Supine(Assisted in keeping heels off bed due to ulceration and pads applied to bilateral heels.  Pt apprehensive with movement of R hip due to pain increase.)   Assessment/Plan    PT Assessment Patient needs continued PT services  PT Problem List Decreased strength;Cardiopulmonary status limiting activity;Decreased balance;Decreased mobility;Decreased activity tolerance       PT Treatment Interventions DME instruction;Therapeutic exercise;Gait training;Balance training;Stair training;Neuromuscular re-education;Functional mobility training;Cognitive remediation;Therapeutic activities;Patient/family education    PT Goals (Current goals can be found in the Care Plan section)  Acute Rehab PT Goals Patient Stated Goal: To return home as soon as possible. PT Goal Formulation: With patient Time For Goal Achievement: 12/07/17 Potential to Achieve Goals: Fair    Frequency Min 2X/week   Barriers to discharge        Co-evaluation               AM-PAC PT "6 Clicks" Daily Activity  Outcome Measure Difficulty turning over in bed (including adjusting bedclothes, sheets and blankets)?: A Lot Difficulty moving from lying on back to sitting on the side of the bed? : A Lot Difficulty sitting down on and standing up from a chair with arms (e.g., wheelchair, bedside commode, etc,.)?: Unable Help needed moving to and from a bed to chair (including a wheelchair)?: Total Help needed walking in hospital room?: Total Help needed climbing 3-5 steps with a railing? : Total 6 Click Score: 8    End of Session Equipment Utilized  During Treatment: Gait belt Activity Tolerance: Treatment limited secondary to medical complications (Comment) Patient left: in bed;with call bell/phone within reach Nurse Communication: Mobility status;Weight bearing status PT Visit Diagnosis: Unsteadiness on feet (R26.81);Muscle weakness (generalized) (M62.81)    Time: 0277-4128 PT Time Calculation (min) (ACUTE ONLY): 27 min   Charges:   PT Evaluation $PT Eval Moderate Complexity: 1 Mod PT Treatments $Therapeutic Exercise: 8-22 mins        Roxanne Gates, PT, DPT   Roxanne Gates 11/16/2017, 9:40 AM

## 2017-11-16 NOTE — Progress Notes (Signed)
ANTICOAGULATION CONSULT NOTE - [place] for continuation of Warfarin Indication: atrial fibrillation  Allergies  Allergen Reactions  . Aspirin     GI Bleeding  . Codeine Other (See Comments)    BLISTERS Other reaction(s): Other (See Comments) BLISTERS   . Codeine Sulfate Other (See Comments)    Other reaction(s): Other (See Comments) BLISTERS BLISTERS    Patient Measurements: Height: 5\' 8"  (172.7 cm) Weight: 151 lb 10.8 oz (68.8 kg) IBW/kg (Calculated) : 68.4 Heparin Dosing Weight:    Vital Signs: Temp: 97.3 F (36.3 C) (07/27 0807) Temp Source: Oral (07/27 0807) BP: 111/65 (07/27 0900) Pulse Rate: 78 (07/27 0929)  Labs: Recent Labs    11/13/17 1659 11/15/17 1352 11/15/17 1436 11/16/17 0443  HGB  --  13.1  --  11.9*  HCT  --  40.6  --  36.5*  PLT  --  254  --  187  LABPROT  --   --  23.1* 26.7*  INR 2.4  --  2.07 2.49  CREATININE  --  2.04*  --  1.84*  CKTOTAL  --  101  --   --     Estimated Creatinine Clearance: 32.5 mL/min (A) (by C-G formula based on SCr of 1.84 mg/dL (H)).   Medical History: Past Medical History:  Diagnosis Date  . Anemia   . Anxiety   . Arthritis   . Cardiomyopathy ([place])    a. 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  [place] Chronic combined systolic (congestive) and diastolic (congestive) heart failure ([place])    a. 05/2015 TEE: EF 35-40% (in setting of Afib).  . Chronic Pelvic pain   . Coronary artery disease    a. 05/2015 CTA Chest: 3 vessel coronary Ca2+.  . Depression   . Diabetes mellitus without complication ([place])   . Emphysema lung ([place])   . Gastric ulcer   . Gout   . Hyperlipidemia   . Hypertension   . Nephrolithiasis   . PAF (paroxysmal atrial fibrillation) ([place])    a. 05/2015 s/p DCCV; b. 03/2016 s/p DCCV; c. CHA2DS2VASc = 6 (coumadin).  . Prostate cancer [facility])    Prostate  . Stage III chronic kidney disease ([place])   . Weight loss    50 lb since Apr 24, 2015     Medications:  Patient states that he is taking Warfarin 2.5mg  daily (just restarted on Wednesday)  Assessment: Patient is a 77yo male being admitted for acute enteritis. Pharmacy is consulted for management of Warfarin. INR on admission is 2.07. Noted that patient will be receiving Cipro and Flagyl.  Goal of Therapy:  INR 2-3 Monitor platelets by anticoagulation protocol: Yes   Plan:  INR increased from 2.07 to 2.49. Will decrease warfarin dose from 2.5 to 1.5 mg x 1 dose due to increase in INR overnight. Will recheck INR with am labs.    Larene Beach, PharmD  11/16/2017 10:37 AM

## 2017-11-16 NOTE — Progress Notes (Signed)
   11/16/17 1340  Clinical Encounter Type  Visited With Patient  Visit Type Initial  Referral From Nurse   Patient states his son has an appointment Monday with an Elder Care attorney in order to draw up Power of Attorney paperwork so he's not sure he still needs an AD.  Son was not present but Chaplain left AD paperwork in room so they could discuss it together.  Patient instructed to ask for chaplain if he would like to receive education about AD or to complete the process.

## 2017-11-16 NOTE — Consult Note (Signed)
PULMONARY / CRITICAL CARE MEDICINE   Name: Gregory Tyler MRN: [id number] DOB: 29-Jan-1941    ADMISSION DATE:  11/15/2017   CONSULTATION DATE:  11/16/2017  REFERRING MD:  Dr Leslye Peer  REASON: Hypovolemic shock  HISTORY OF PRESENT ILLNESS:   77 y/o male with chronic A. fib on Coumadin, COPD, type 2 diabetes, congestive heart failure, hypertension, prostate cancer, hyperlipidemia, peptic ulcer disease, chronic kidney disease, and gastric bypass sent form his PCP'S office for evaluation of hypotension.  Patient had gone for a visit at [facility] his primary care office with abdominal pain, generalized weakness and a right leg swelling.  At his PCPs office, he was noted to be hypotensive hence he was sent to the ED for evaluation.  Patient has a history of chronic intermittent diarrhea this time he experienced abdominal pain and generalized weakness.  He described abdominal pain as sharp, intermittent and has been present for several weeks.  He is unable to state exactly if he took any remedies or not.  He notes intermittent nausea as well as diarrhea.  At the ED, his blood pressure 80/42 with a map of 55.  He was given IV fluids but his blood pressure remained low hence he is being admitted to the ICU for further management.  His CT abdomen showed diffuse edema and small bowel wall thickening suggestive of gastroenteritis, constipation, nonobstructive renal stones and diverticulosis without diverticulitis Of note, patient's Coumadin has been on hold for a supratherapeutic INR.  PAST MEDICAL HISTORY :  He  has a past medical history of Anemia, Anxiety, Arthritis, Cardiomyopathy ([place]), Chronic combined systolic (congestive) and diastolic (congestive) heart failure ([place]), Chronic Pelvic pain, Coronary artery disease, Depression, Diabetes mellitus without complication ([place]), Emphysema lung ([place]), Gastric ulcer, Gout, Hyperlipidemia, Hypertension, Nephrolithiasis, PAF (paroxysmal atrial fibrillation) ([place]),  Prostate cancer ([place]), Stage III chronic kidney disease ([place]), and Weight loss.  PAST SURGICAL HISTORY: He  has a past surgical history that includes Cholecystectomy; Appendectomy; Gastric bypass; Hemorrhoid surgery; Back surgery; pilonidal cyst; Cardiac catheterization (N/A, 06/17/2015); TEE without cardioversion (N/A, 06/17/2015); and Cardiac catheterization (N/A, 03/30/2016).  Allergies  Allergen Reactions  . Aspirin     GI Bleeding  . Codeine Other (See Comments)    BLISTERS Other reaction(s): Other (See Comments) BLISTERS   . Codeine Sulfate Other (See Comments)    Other reaction(s): Other (See Comments) BLISTERS BLISTERS    No current facility-administered medications on file prior to encounter.    Current Outpatient Medications on File Prior to Encounter  Medication Sig  . amiodarone (PACERONE) 200 MG tablet TAKE 1 TABLET BY MOUTH ONCE DAILY  . atorvastatin (LIPITOR) 20 MG tablet TAKE 1 TABLET BY MOUTH ONCE DAILY  . furosemide (LASIX) 20 MG tablet Take 40mg  in the morning and 20 mg in the afternoon  . gabapentin (NEURONTIN) 100 MG capsule Take 1 capsule (100 mg total) by mouth at bedtime.  Marland Kitchen lisinopril (PRINIVIL,ZESTRIL) 5 MG tablet Take 1 tablet (5 mg total) by mouth daily.  . metFORMIN (GLUCOPHAGE) 500 MG tablet Take 500 mg by mouth 2 (two) times daily with a meal.   . metoprolol succinate (TOPROL-XL) 25 MG 24 hr tablet Take 0.5 tablets (12.5 mg total) by mouth daily.  . sodium chloride (OCEAN) 0.65 % SOLN nasal spray Place 1 spray into both nostrils as needed for congestion.  . SYMBICORT 160-4.5 MCG/ACT inhaler Inhale 2 puffs into the lungs 2 (two) times daily.   Marland Kitchen warfarin (COUMADIN) 4 MG tablet Take 1 tablet (4 mg total)  by mouth daily. (Patient taking differently: Take 2.5 mg by mouth daily. )  . acetaminophen (TYLENOL) 325 MG tablet Take 2 tablets (650 mg total) by mouth every 6 (six) hours as needed for mild pain (or Fever >/= 101).  Marland Kitchen albuterol (PROVENTIL HFA;VENTOLIN  HFA) 108 (90 Base) MCG/ACT inhaler Inhale 2 puffs into the lungs every 6 (six) hours as needed for wheezing or shortness of breath.  Marland Kitchen ammonium lactate (LAC-HYDRIN) 12 % lotion Apply twice a day to feet  . Elastic Bandages & Supports (MEDICAL COMPRESSION THIGH HIGH) MISC 1 application by Does not apply route daily.  . feeding supplement, ENSURE ENLIVE, (ENSURE ENLIVE) LIQD Take 237 mLs by mouth 2 (two) times daily between meals.  Marland Kitchen HYDROcodone-acetaminophen (NORCO/VICODIN) 5-325 MG tablet Take 1 tablet by mouth every 4 (four) hours as needed for moderate pain.     FAMILY HISTORY:  His family history includes CAD in his mother; COPD in his mother; Diabetes in his other; Heart failure in his mother; Stroke in his mother.  SOCIAL HISTORY: He  reports that he has quit smoking. His smokeless tobacco use includes chew. He reports that he does not drink alcohol or use drugs.  REVIEW OF SYSTEMS:   Constitutional: Negative for fever and chills.  HENT: Negative for congestion and rhinorrhea.  Eyes: Negative for redness and visual disturbance.  Respiratory: Negative for shortness of breath and wheezing.  Cardiovascular: Negative for chest pain and palpitations.  Gastrointestinal: Positive for nausea , abdominal pain and  Loose stools Genitourinary: Negative for dysuria and urgency.  Endocrine: Denies polyuria, polyphagia and heat intolerance Musculoskeletal: Positive for difficulty walking and generalized weakness.  Skin: Negative for pallor and wound.  Neurological: Negative for dizziness and headaches   SUBJECTIVE:   VITAL SIGNS: BP (!) 81/48   Pulse 70   Temp (!) 97.4 F (36.3 C) (Oral)   Resp 17   Ht 5\' 8"  (1.727 m)   Wt 151 lb 10.8 oz (68.8 kg)   SpO2 96%   BMI 23.06 kg/m   HEMODYNAMICS:    VENTILATOR SETTINGS:    INTAKE / OUTPUT: I/O last 3 completed shifts: In: 1306.7 [IV Piggyback:1306.7] Out: -   PHYSICAL EXAMINATION: General: Appears chronically ill Neuro: Alert  and oriented x2, moves all extremities, follows basic commands HEENT: PERRLA, trachea midline, no JVD Cardiovascular: Pulse irregular, S1-S2, no murmur regurg or gallop, +2 pulses bilaterally, +2 edema Lungs: Bilateral breath sounds, diminished in the bases, no wheezes or rhonchi Abdomen: Nondistended, normal bowel sounds in all 4 quadrants, palpation reveals diffuse tenderness in bilateral lower quadrant Musculoskeletal: No joint deformities positive range of motion; multiple ulcers in bilateral feet Skin: Warm and dry, multiple open areas in the sole of both feet  LABS:  BMET Recent Labs  Lab 11/15/17 1352  NA 134*  K 5.6*  CL 109  CO2 16*  BUN 48*  CREATININE 2.04*  GLUCOSE 187*    Electrolytes Recent Labs  Lab 11/15/17 1352  CALCIUM 8.2*    CBC Recent Labs  Lab 11/15/17 1352  WBC 25.5*  HGB 13.1  HCT 40.6  PLT 254    Coag's Recent Labs  Lab 11/13/17 11/13/17 1659 11/15/17 1436  INR 2.4 2.4 2.07    Sepsis Markers No results for input(s): LATICACIDVEN, PROCALCITON, O2SATVEN in the last 168 hours.  ABG No results for input(s): PHART, PCO2ART, PO2ART in the last 168 hours.  Liver Enzymes Recent Labs  Lab 11/15/17 1352  AST 29  ALT 26  ALKPHOS 79  BILITOT 0.8  ALBUMIN 3.0*    Cardiac Enzymes No results for input(s): TROPONINI, PROBNP in the last 168 hours.  Glucose Recent Labs  Lab 11/15/17 1719  GLUCAP 81    Imaging Ct Abdomen Pelvis Wo Contrast  Result Date: 11/15/2017 CLINICAL DATA:  Abdominal pain. Swelling in the right lower quadrant/groin. Chronic diarrhea intermittently. History of prostate cancer, kidney stones, diverticulitis, gastric bypass, and cholecystectomy. EXAM: CT ABDOMEN AND PELVIS WITHOUT CONTRAST TECHNIQUE: Multidetector CT imaging of the abdomen and pelvis was performed following the standard protocol without IV contrast. COMPARISON:  CT scan December 08, 2008 FINDINGS: Lower chest: No acute abnormality. Hepatobiliary:  Previous cholecystectomy. Limited views of the liver are unremarkable. Pancreas: Unremarkable. No pancreatic ductal dilatation or surrounding inflammatory changes. Spleen: Normal in size without focal abnormality. Adrenals/Urinary Tract: Adrenal glands are normal. No stones in the right kidney. Multiple stones are seen in the left kidney with the largest in the midpole measuring 5.2 mm. No hydronephrosis or perinephric stranding. No identified ureteral stones. The bladder is poorly distended but unremarkable. Stomach/Bowel: Evaluation of the bowel is limited without oral contrast. The distal duodenum is normal. The patient is status post gastric bypass. The small bowel loops are poorly distended limiting evaluation. However, there is fecal material in a distal small bowel loop best seen on coronal image 46. There is also suggested small bowel wall thickening in some locations such as on coronal is 65 in the left abdomen and coronal image 62 in the pelvis. Colonic diverticulosis is seen without diverticulitis. The appendix is not visualized but there is no secondary evidence of appendicitis. Vascular/Lymphatic: Atherosclerosis is seen in the nonaneurysmal aorta. Atherosclerosis extends into the iliac and femoral vessels. No adenopathy. Reproductive: Previous prostatectomy. Other: Increased attenuation in the intra and extra abdominal fat diffusely consistent with edema. Musculoskeletal: No bony metastatic disease noted. There is scalloping of the superior endplate of L1 which is age indeterminate but favored to be chronic. No definitive acute fracture. IMPRESSION: 1. Small bowel wall thickening. Given the diffuse edema elsewhere, the small bowel wall thickening could be due to volume overload or hypo proteinuria. Enteritis is not excluded. Recommend clinical correlation. 2. Fecal material in the distal small bowel suggest slow transit. 3. Left renal stones without obstruction. 4. Diverticulosis without  diverticulitis. 5. Atherosclerosis throughout the aorta, iliac vessels, and femoral vessels. 6. Increased attenuation in the intra and extra abdominal fat diffusely consistent with edema which could be due to volume overload or hypo proteinuria. 7. Scalloping of the superior endplate of L1 is age indeterminate but may not be acute. Recommend clinical correlation. Electronically Signed   By: Dorise Bullion III M.D   On: 11/15/2017 14:51    STUDIES:  none  CULTURES: None  ANTIBIOTICS: Ciprofloxacin Metronidazole  SIGNIFICANT EVENTS: 11/15/17: Admitted  LINES/TUBES: Peripheral IV  DISCUSSION: 77 year old male presenting with hypovolemic/septic shock from severe gastroenteritis  ASSESSMENT  Acute gastroenteritis Septic/hypovolemic shock Chronic atrial fibrillation on Coumadin Type 2 diabetes Congestive heart failure  PLAN Hemodynamic monitoring per ICU protocol Antibiotics as above PT/INR with Coumadin dosing per pharmacy Blood glucose monitoring with sliding scale insulin coverage IV fluids and pressors to maintain mean arterial blood pressure greater than 65 Trend procalcitonin GI and DVT prophyalxis  FAMILY  - Updates: no family at bedside. Will update when available  Jeff Frieden S. [facility] ANP-BC Pulmonary and Critical Care Medicine [facility] Pager [phone] or [phone]  NB: This document was prepared using Systems analyst and may  include unintentional dictation errors.    11/16/2017, 2:25 AM

## 2017-11-16 NOTE — Clinical Social Work Note (Signed)
Clinical Social Work Assessment  Patient Details  Name: Gregory Tyler MRN: [id number] Date of Birth: November 06, 1940  Date of referral:  11/16/17               Reason for consult:  Facility Placement                Permission sought to share information with:  Case Manager, Customer service manager, Family Supports Permission granted to share information::  Yes, Verbal Permission Granted  Name::       SNF  Agency::   [facility]   Relationship::     Contact Information:     Housing/Transportation Living arrangements for the past 2 months:  [place] of Information:  Patient Patient Interpreter Needed:  None Criminal Activity/Legal Involvement Pertinent to Current Situation/Hospitalization:  No - Comment as needed Significant Relationships:  Adult Children, Spouse Lives with:  Self, Spouse Do you feel safe going back to the place where you live?  Yes Need for family participation in patient care:  Yes (Comment)  Care giving concerns:  Patient lives with his wife. He is the primary caregiver for his wife.     Social Worker assessment / plan:  CSW consulted for SNF placement. CSW met with patient to discuss discharge planning. CSW introduced self and explained role. Patient states that he takes care of his wife at home and she has Parkinson's disease. Patient states that his wife fell a few weeks ago and she is now at [facility] for rehab. CSW explained that PT recommends he go to rehab as well. Patient agrees that he needs to go to rehab and would like to go to [facility] as well. Patient states that his son helps as he can but he also has a family to care for. Patient gave permission to start bed search. CSW initiated bed search. CSW will give offers once received. CSW will follow for discharge planning.   Employment status:  Retired Forensic scientist:  Medicare PT Recommendations:  [place] / Referral to community  resources:  [place]  Patient/Family's Response to care:  Patient thanked CSW for assistance   Patient/Family's Understanding of and Emotional Response to Diagnosis, Current Treatment, and Prognosis:  Patient in agreement with discharge to SNF for rehab.   Emotional Assessment Appearance:  Appears stated age Attitude/Demeanor/Rapport:    Affect (typically observed):  Accepting, Pleasant Orientation:  Oriented to Self, Oriented to Place, Oriented to  Time Alcohol / Substance use:  Not Applicable Psych involvement (Current and /or in the community):  No (Comment)  Discharge Needs  Concerns to be addressed:  Discharge Planning Concerns Readmission within the last 30 days:  No Current discharge risk:  None Barriers to Discharge:  Continued Medical Work up   [employer], [place] 11/16/2017, 3:22 PM

## 2017-11-17 LAB — BASIC METABOLIC PANEL
Anion gap: 4 — ABNORMAL LOW (ref 5–15)
BUN: 48 mg/dL — ABNORMAL HIGH (ref 8–23)
CALCIUM: 7.4 mg/dL — AB (ref 8.9–10.3)
CO2: 16 mmol/L — ABNORMAL LOW (ref 22–32)
CREATININE: 1.86 mg/dL — AB (ref 0.61–1.24)
Chloride: 115 mmol/L — ABNORMAL HIGH (ref 98–111)
GFR calc non Af Amer: 33 mL/min — ABNORMAL LOW (ref >60)
GFR, EST AFRICAN AMERICAN: 39 mL/min — AB (ref >60)
Glucose, Bld: 164 mg/dL — ABNORMAL HIGH (ref 70–99)
Potassium: 4.4 mmol/L (ref 3.5–5.1)
SODIUM: 135 mmol/L (ref 135–145)

## 2017-11-17 LAB — PHOSPHORUS: PHOSPHORUS: 3.2 mg/dL (ref 2.5–4.6)

## 2017-11-17 LAB — MAGNESIUM: MAGNESIUM: 1.4 mg/dL — AB (ref 1.7–2.4)

## 2017-11-17 LAB — CBC
HCT: 31.2 % — ABNORMAL LOW (ref 40.0–52.0)
Hemoglobin: 10.5 g/dL — ABNORMAL LOW (ref 13.0–18.0)
MCH: 29.3 pg (ref 26.0–34.0)
MCHC: 33.6 g/dL (ref 32.0–36.0)
MCV: 87.3 fL (ref 80.0–100.0)
Platelets: 179 10*3/uL (ref 150–440)
RBC: 3.57 MIL/uL — ABNORMAL LOW (ref 4.40–5.90)
RDW: 22.2 % — AB (ref 11.5–14.5)
WBC: 15.9 10*3/uL — ABNORMAL HIGH (ref 3.8–10.6)

## 2017-11-17 LAB — GLUCOSE, CAPILLARY
Glucose-Capillary: 127 mg/dL — ABNORMAL HIGH (ref 70–99)
Glucose-Capillary: 153 mg/dL — ABNORMAL HIGH (ref 70–99)
Glucose-Capillary: 213 mg/dL — ABNORMAL HIGH (ref 70–99)
Glucose-Capillary: 107 mg/dL — ABNORMAL HIGH (ref 70–99)

## 2017-11-17 LAB — PROTIME-INR
INR: 2.38
PROTHROMBIN TIME: 25.8 s — AB (ref 11.4–15.2)

## 2017-11-17 MED ORDER — TRAMADOL HCL 50 MG PO TABS
50.0000 mg | ORAL_TABLET | Freq: Four times a day (QID) | ORAL | Status: DC | PRN
  Administered 2017-11-17: 50 mg via ORAL
  Filled 2017-11-17: qty 1

## 2017-11-17 MED ORDER — WARFARIN SODIUM 1 MG PO TABS
1.5000 mg | ORAL_TABLET | Freq: Once | ORAL | Status: AC
  Administered 2017-11-17: 1.5 mg via ORAL
  Filled 2017-11-17: qty 1

## 2017-11-17 MED ORDER — WARFARIN SODIUM 2.5 MG PO TABS
2.5000 mg | ORAL_TABLET | Freq: Every day | ORAL | Status: DC
  Administered 2017-11-18: 2.5 mg via ORAL
  Filled 2017-11-17 (×2): qty 1

## 2017-11-17 MED ORDER — PREMIER PROTEIN SHAKE
11.0000 [oz_av] | Freq: Two times a day (BID) | ORAL | Status: DC
  Administered 2017-11-17 – 2017-11-19 (×4): 11 [oz_av] via ORAL

## 2017-11-17 MED ORDER — ADULT MULTIVITAMIN W/MINERALS CH
1.0000 | ORAL_TABLET | Freq: Every day | ORAL | Status: DC
  Administered 2017-11-18 – 2017-11-19 (×2): 1 via ORAL
  Filled 2017-11-17 (×2): qty 1

## 2017-11-17 NOTE — Progress Notes (Signed)
Patient ID: Gregory Tyler, male   DOB: August 01, 1940, 77 y.o.   MRN: [id number] Pulmonary / Critical Care Attending  Patient wishes code status to be DNR.Will change orders  Hermelinda Dellen, DO

## 2017-11-17 NOTE — Progress Notes (Signed)
Follow up - Critical Care Medicine Note  Patient Details:    Gregory Tyler is an 77 y.o. male.with chronic A. fib on Coumadin, COPD, type 2 diabetes, congestive heart failure, hypertension, prostate cancer, hyperlipidemia, peptic ulcer disease, chronic kidney disease, and gastric bypasssent form his PCP'S office for evaluation of hypotension.in the emergency department he was hypotensive,His CT abdomen showed diffuse edema and small bowel wall thickening suggestive of gastroenteritis, constipation, nonobstructive renal stones and diverticulosis without diverticulitis Of note, patient's Coumadin has been on hold fora supratherapeutic INR.  Lines, Airways, Drains: External Urinary Catheter (Active)  Collection Container Standard drainage bag 11/17/2017  1:50 AM  Securement Method Securing device (Describe) 11/17/2017  1:50 AM    Anti-infectives:  Anti-infectives (From admission, onward)   Start     Dose/Rate Route Frequency Ordered Stop   11/15/17 1545  ciprofloxacin (CIPRO) IVPB 200 mg     200 mg 100 mL/hr over 60 Minutes Intravenous Every 12 hours 11/15/17 1532     11/15/17 1545  metroNIDAZOLE (FLAGYL) IVPB 500 mg     500 mg 100 mL/hr over 60 Minutes Intravenous Every 8 hours 11/15/17 1532     11/15/17 1538  sodium chloride 0.9 % with ceFEPIme (MAXIPIME) ADS Med    Note to Pharmacy:  Hollie Salk   : cabinet override      11/15/17 1538 11/15/17 1711   11/15/17 1515  piperacillin-tazobactam (ZOSYN) IVPB 3.375 g  Status:  Discontinued     3.375 g 100 mL/hr over 30 Minutes Intravenous  Once 11/15/17 1502 11/15/17 1532      Microbiology: Results for orders placed or performed during the hospital encounter of 11/15/17  C difficile quick scan w PCR reflex     Status: None   Collection Time: 11/15/17  3:03 PM  Result Value Ref Range Status   C Diff antigen NEGATIVE NEGATIVE Final   C Diff toxin NEGATIVE NEGATIVE Final   C Diff interpretation No C. difficile detected.  Final     Comment: Performed at [facility], [address] [place], [place] [address]  Gastrointestinal Panel by PCR , Stool     Status: None   Collection Time: 11/15/17  3:13 PM  Result Value Ref Range Status   Campylobacter species NOT DETECTED NOT DETECTED Final   Plesimonas shigelloides NOT DETECTED NOT DETECTED Final   Salmonella species NOT DETECTED NOT DETECTED Final   Yersinia enterocolitica NOT DETECTED NOT DETECTED Final   Vibrio species NOT DETECTED NOT DETECTED Final   Vibrio cholerae NOT DETECTED NOT DETECTED Final   Enteroaggregative E coli (EAEC) NOT DETECTED NOT DETECTED Final   Enteropathogenic E coli (EPEC) NOT DETECTED NOT DETECTED Final   Enterotoxigenic E coli (ETEC) NOT DETECTED NOT DETECTED Final   Shiga like toxin producing E coli (STEC) NOT DETECTED NOT DETECTED Final   Shigella/Enteroinvasive E coli (EIEC) NOT DETECTED NOT DETECTED Final   Cryptosporidium NOT DETECTED NOT DETECTED Final   Cyclospora cayetanensis NOT DETECTED NOT DETECTED Final   Entamoeba histolytica NOT DETECTED NOT DETECTED Final   Giardia lamblia NOT DETECTED NOT DETECTED Final   Adenovirus F40/41 NOT DETECTED NOT DETECTED Final   Astrovirus NOT DETECTED NOT DETECTED Final   Norovirus GI/GII NOT DETECTED NOT DETECTED Final   Rotavirus A NOT DETECTED NOT DETECTED Final   Sapovirus (I, II, IV, and V) NOT DETECTED NOT DETECTED Final    Comment: Performed at [facility], [address], [place], [place] [address]  MRSA PCR Screening  Status: None   Collection Time: 11/16/17  2:03 AM  Result Value Ref Range Status   MRSA by PCR NEGATIVE NEGATIVE Final    Comment:        The GeneXpert MRSA Assay (FDA approved for NASAL specimens only), is one component of a comprehensive MRSA colonization surveillance program. It is not intended to diagnose MRSA infection nor to guide or monitor treatment for MRSA infections. Performed at [facility], [address],  [place], [place] [address]     Studies: Ct Abdomen Pelvis Wo Contrast  Result Date: 11/15/2017 CLINICAL DATA:  Abdominal pain. Swelling in the right lower quadrant/groin. Chronic diarrhea intermittently. History of prostate cancer, kidney stones, diverticulitis, gastric bypass, and cholecystectomy. EXAM: CT ABDOMEN AND PELVIS WITHOUT CONTRAST TECHNIQUE: Multidetector CT imaging of the abdomen and pelvis was performed following the standard protocol without IV contrast. COMPARISON:  CT scan December 08, 2008 FINDINGS: Lower chest: No acute abnormality. Hepatobiliary: Previous cholecystectomy. Limited views of the liver are unremarkable. Pancreas: Unremarkable. No pancreatic ductal dilatation or surrounding inflammatory changes. Spleen: Normal in size without focal abnormality. Adrenals/Urinary Tract: Adrenal glands are normal. No stones in the right kidney. Multiple stones are seen in the left kidney with the largest in the midpole measuring 5.2 mm. No hydronephrosis or perinephric stranding. No identified ureteral stones. The bladder is poorly distended but unremarkable. Stomach/Bowel: Evaluation of the bowel is limited without oral contrast. The distal duodenum is normal. The patient is status post gastric bypass. The small bowel loops are poorly distended limiting evaluation. However, there is fecal material in a distal small bowel loop best seen on coronal image 46. There is also suggested small bowel wall thickening in some locations such as on coronal is 65 in the left abdomen and coronal image 62 in the pelvis. Colonic diverticulosis is seen without diverticulitis. The appendix is not visualized but there is no secondary evidence of appendicitis. Vascular/Lymphatic: Atherosclerosis is seen in the nonaneurysmal aorta. Atherosclerosis extends into the iliac and femoral vessels. No adenopathy. Reproductive: Previous prostatectomy. Other: Increased attenuation in the intra and extra abdominal fat diffusely  consistent with edema. Musculoskeletal: No bony metastatic disease noted. There is scalloping of the superior endplate of L1 which is age indeterminate but favored to be chronic. No definitive acute fracture. IMPRESSION: 1. Small bowel wall thickening. Given the diffuse edema elsewhere, the small bowel wall thickening could be due to volume overload or hypo proteinuria. Enteritis is not excluded. Recommend clinical correlation. 2. Fecal material in the distal small bowel suggest slow transit. 3. Left renal stones without obstruction. 4. Diverticulosis without diverticulitis. 5. Atherosclerosis throughout the aorta, iliac vessels, and femoral vessels. 6. Increased attenuation in the intra and extra abdominal fat diffusely consistent with edema which could be due to volume overload or hypo proteinuria. 7. Scalloping of the superior endplate of L1 is age indeterminate but may not be acute. Recommend clinical correlation. Electronically Signed   By: Dorise Bullion III M.D   On: 11/15/2017 14:51    Consults:    Subjective:    Overnight Issues: last evening he required Neo-Synephrine infusion. Presently try to wean off this morning. He does not have any specific complaint this morning  Objective:  Vital signs for last 24 hours: Temp:  [97.6 F (36.4 C)-98.2 F (36.8 C)] 98 F (36.7 C) (07/28 0300) Pulse Rate:  [56-79] 68 (07/28 0700) Resp:  [7-20] 15 (07/28 0700) BP: (84-126)/(45-74) 122/66 (07/28 0700) SpO2:  [96 %-99 %] 98 % (07/28 0700)  Hemodynamic parameters for last 24 hours:    Intake/Output from previous day: 07/27 0701 - 07/28 0700 In: 1945.4 [P.O.:240; I.V.:1405.4; IV Piggyback:300] Out: 250 [Urine:250]  Intake/Output this shift: No intake/output data recorded.  Vent settings for last 24 hours:    Physical Exam:  Vital signs: Please see the above listed vital signs HEENT: Trachea is midline, no accessory muscle utilization, no distended neck veins Cardiovascular: Irregularly  irregular rhythm Abdominal: Positive bowel sounds, soft exam Extremities: No clubbing cyanosis or edema noted Neurologic: No focal deficits appreciated  Assessment/Plan:   Shock. Patient on Neo-Synephrine, will empirically add hydrocortisone and try to wean for goal mean arterial pressure gradient and 65  Gastroenteritis. patient is on Cipro and Flagyl. No evidence of diarrhea yesterday  Metabolic acidosis. Non-anion gap metabolic acidosis most likely secondary to saline resuscitation  Chronic atrial fibrillation.  controlled ventricular response  Supratherapeutic INR. Now therapeutic back on Coumadin  Leukocytosis. Has decreased on antibiotic therapy  Anemia. with no evidence of active bleeding   Hermelinda Dellen, DO  Critical Care Total Time 35 minutes  Reanna Scoggin 11/17/2017  *Care during the described time interval was provided by me and/or other providers on the critical care team.  I have reviewed this patient's available data, including medical history, events of note, physical examination and test results as part of my evaluation.

## 2017-11-17 NOTE — Progress Notes (Signed)
1700 Report called to [facility] on 1A. 1730 Transferred to room 149 via bed.

## 2017-11-17 NOTE — Progress Notes (Signed)
Good day. Up in chair x 3 hours until he insisted on returning to bed. Off of Neo-Synephrine since 1000. Patient himself a DNR this afternoon. Requiring no oxygen today. Complains of chronic back pain- medicated with Tramadol with relief.

## 2017-11-17 NOTE — Progress Notes (Signed)
[facility] [place] NAME: Dyllen Menning    MR#:  [id number]  DATE OF BIRTH:  Dec 22, 1940  SUBJECTIVE:  CHIEF COMPLAINT:   Chief Complaint  Patient presents with  . Abdominal Pain  Patient seen and evaluated today No abdominal pain No diarrhea  Had low blood pressure last night On IV Neo-Synephrine drip   REVIEW OF SYSTEMS:    ROS  CONSTITUTIONAL: No documented fever. No fatigue, weakness. No weight gain, no weight loss.  EYES: No blurry or double vision.  ENT: No tinnitus. No postnasal drip. No redness of the oropharynx.  RESPIRATORY: No cough, no wheeze, no hemoptysis. No dyspnea.  CARDIOVASCULAR: No chest pain. No orthopnea. No palpitations. No syncope.  GASTROINTESTINAL: No nausea, no vomiting, had diarrhea. Had no abdominal pain. No melena or hematochezia.  GENITOURINARY: No dysuria or hematuria.  ENDOCRINE: No polyuria or nocturia. No heat or cold intolerance.  HEMATOLOGY: No anemia. No bruising. No bleeding.  INTEGUMENTARY: No rashes. No lesions.  MUSCULOSKELETAL: No arthritis. No swelling. No gout.  NEUROLOGIC: No numbness, tingling, or ataxia. No seizure-type activity.  PSYCHIATRIC: No anxiety. No insomnia. No ADD.   DRUG ALLERGIES:   Allergies  Allergen Reactions  . Aspirin     GI Bleeding  . Codeine Other (See Comments)    BLISTERS Other reaction(s): Other (See Comments) BLISTERS   . Codeine Sulfate Other (See Comments)    Other reaction(s): Other (See Comments) BLISTERS BLISTERS    VITALS:  Blood pressure (!) 87/56, pulse 68, temperature 98.7 F (37.1 C), resp. rate 13, height 5\' 8"  (1.727 m), weight 68.8 kg (151 lb 10.8 oz), SpO2 100 %.  PHYSICAL EXAMINATION:   Physical Exam  GENERAL:  77 y.o.-year-old patient lying in the bed with no acute distress.  EYES: Pupils equal, round, reactive to light and accommodation. No scleral icterus. Extraocular muscles intact.  HEENT: Head atraumatic,  normocephalic. Oropharynx and nasopharynx clear.  NECK:  Supple, no jugular venous distention. No thyroid enlargement, no tenderness.  LUNGS: Normal breath sounds bilaterally, no wheezing, rales, rhonchi. No use of accessory muscles of respiration.  CARDIOVASCULAR: S1, S2 normal. No murmurs, rubs, or gallops.  ABDOMEN: Soft, nontender, nondistended. Bowel sounds present. No organomegaly or mass.  EXTREMITIES: No cyanosis, clubbing or edema b/l.    NEUROLOGIC: Cranial nerves II through XII are intact. No focal Motor or sensory deficits b/l.   PSYCHIATRIC: The patient is alert and oriented x 3.  SKIN: No obvious rash, lesion, or ulcer.   LABORATORY PANEL:   CBC Recent Labs  Lab 11/17/17 0537  WBC 15.9*  HGB 10.5*  HCT 31.2*  PLT 179   ------------------------------------------------------------------------------------------------------------------ Chemistries  Recent Labs  Lab 11/15/17 1352  11/17/17 0537  NA 134*   < > 135  K 5.6*   < > 4.4  CL 109   < > 115*  CO2 16*   < > 16*  GLUCOSE 187*   < > 164*  BUN 48*   < > 48*  CREATININE 2.04*   < > 1.86*  CALCIUM 8.2*   < > 7.4*  MG  --   --  1.4*  AST 29  --   --   ALT 26  --   --   ALKPHOS 79  --   --   BILITOT 0.8  --   --    < > = values in this interval not displayed.   ------------------------------------------------------------------------------------------------------------------  Cardiac Enzymes No results for  input(s): TROPONINI in the last 168 hours. ------------------------------------------------------------------------------------------------------------------  RADIOLOGY:  Ct Abdomen Pelvis Wo Contrast  Result Date: 11/15/2017 CLINICAL DATA:  Abdominal pain. Swelling in the right lower quadrant/groin. Chronic diarrhea intermittently. History of prostate cancer, kidney stones, diverticulitis, gastric bypass, and cholecystectomy. EXAM: CT ABDOMEN AND PELVIS WITHOUT CONTRAST TECHNIQUE: Multidetector CT imaging  of the abdomen and pelvis was performed following the standard protocol without IV contrast. COMPARISON:  CT scan December 08, 2008 FINDINGS: Lower chest: No acute abnormality. Hepatobiliary: Previous cholecystectomy. Limited views of the liver are unremarkable. Pancreas: Unremarkable. No pancreatic ductal dilatation or surrounding inflammatory changes. Spleen: Normal in size without focal abnormality. Adrenals/Urinary Tract: Adrenal glands are normal. No stones in the right kidney. Multiple stones are seen in the left kidney with the largest in the midpole measuring 5.2 mm. No hydronephrosis or perinephric stranding. No identified ureteral stones. The bladder is poorly distended but unremarkable. Stomach/Bowel: Evaluation of the bowel is limited without oral contrast. The distal duodenum is normal. The patient is status post gastric bypass. The small bowel loops are poorly distended limiting evaluation. However, there is fecal material in a distal small bowel loop best seen on coronal image 46. There is also suggested small bowel wall thickening in some locations such as on coronal is 65 in the left abdomen and coronal image 62 in the pelvis. Colonic diverticulosis is seen without diverticulitis. The appendix is not visualized but there is no secondary evidence of appendicitis. Vascular/Lymphatic: Atherosclerosis is seen in the nonaneurysmal aorta. Atherosclerosis extends into the iliac and femoral vessels. No adenopathy. Reproductive: Previous prostatectomy. Other: Increased attenuation in the intra and extra abdominal fat diffusely consistent with edema. Musculoskeletal: No bony metastatic disease noted. There is scalloping of the superior endplate of L1 which is age indeterminate but favored to be chronic. No definitive acute fracture. IMPRESSION: 1. Small bowel wall thickening. Given the diffuse edema elsewhere, the small bowel wall thickening could be due to volume overload or hypo proteinuria. Enteritis is not  excluded. Recommend clinical correlation. 2. Fecal material in the distal small bowel suggest slow transit. 3. Left renal stones without obstruction. 4. Diverticulosis without diverticulitis. 5. Atherosclerosis throughout the aorta, iliac vessels, and femoral vessels. 6. Increased attenuation in the intra and extra abdominal fat diffusely consistent with edema which could be due to volume overload or hypo proteinuria. 7. Scalloping of the superior endplate of L1 is age indeterminate but may not be acute. Recommend clinical correlation. Electronically Signed   By: Dorise Bullion III M.D   On: 11/15/2017 14:51     ASSESSMENT AND PLAN:   77 year old male patient with history of coronary artery disease, type 2 diabetes mellitus, emphysema of lung, gout, hyperlipidemia, hypertension, systolic and diastolic heart failure currently under hospitalist service for gastroenteritis, dehydration -Hypotension and shock Continue IV Neo-Synephrine drip to support blood pressure Wean pressor with mean arterial pressure of 65  -Acute gastroenteritis improving Continue IV ciprofloxacin and IV Flagyl antibiotics Advanced diet  -Acute kidney injury secondary to dehydration Improved  -Chronic atrial fibrillation on anticoagulation with Coumadin Continue amiodarone and Toprol for rate control  -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 Management plans discussed with the patient, family and they are in agreement.  CODE STATUS: Full code  DVT Prophylaxis: SCDs  TOTAL TIME TAKING CARE OF THIS PATIENT: 34 minutes.   POSSIBLE D/C IN 2 to 3 DAYS, DEPENDING ON CLINICAL CONDITION.  Saundra Shelling M.D on 11/17/2017 at 12:43 PM  Between 7am to 6pm - Pager - [phone]  After 6pm go to [email or web address] - password [email or web address] [facility]  Office  [phone]  CC: Primary care physician; [name], MD  Note: This dictation was prepared with Dragon dictation along with smaller phrase technology. Any transcriptional errors that result from this process are unintentional.

## 2017-11-17 NOTE — Progress Notes (Signed)
Initial Nutrition Assessment  DOCUMENTATION CODES:   Non-severe (moderate) malnutrition in context of chronic illness  INTERVENTION:  RN discontinued Ensure Enlive.  Provide Premier Protein po BID, each supplement provides 160 kcal and 30 grams of protein.  Provide daily MVI.  NUTRITION DIAGNOSIS:   Moderate Malnutrition related to chronic illness(CHF, CKD stage III, hx gastric bypass) as evidenced by moderate fat depletion, moderate muscle depletion.  GOAL:   Patient will meet greater than or equal to 90% of their needs  MONITOR:   PO intake, Supplement acceptance, Labs, Weight trends, Skin, I & O's  REASON FOR ASSESSMENT:   Malnutrition Screening Tool    ASSESSMENT:   77 year old male with PMHx of arthritis, DM, HTN, emphysema, HLD, gout, gastric ulcer, nephrolithiasis, CAD, anxiety, depression, CHF, cardiomyopathy, paroxysmal A-fib, CKD stage III, hx gastric bypass who is now admitted with shock, gastroenteritis, dehydration.   Met with patient in room. He was sitting up in chair. He reports he typically has a fairly good appetite. He does report some difficulty with getting to certain things too high in his kitchen, so he has moved almost everything he needs on the counter. For about 4-5 days PTA he was not eating well due to not feeling well. He reports his appetite has improved here and he is eating fairly well at meals. He reports he asked to be put on a regular diet yesterday because he did not need a liquid diet. He reports he is tolerating regular diet. Denies any post-prandial N/V or abdominal pain. This morning he had eggs, biscuit, bacon, and oatmeal. He does not like the Ensure. He reports they are too thick and sweet. He is amenable to trying Premier Protein. Patient reports he is not taking any vitamins/minerals. He is at risk for many vitamin/mineral deficiencies due to hx of gastric bypass. He cannot recall when he had his surgery or the details of his  surgery.  Patient reports he used to weigh 365 lbs prior to his gastric bypass. He then lost "too much weight" after surgery. He then regained some of his weight. Recently he reports weight loss but is unsure of specifics. Per chart patient was 166.5 lbs on 05/29/2017 and has lost 14.8 lbs (8.9% body weight) over the past almost 6 months, which is not quite significant for time frame.  Meal Completion: 80-100%  Medications reviewed and include: amiodarone, Novolog 0-9 units TID, Novolog 0-5 units QHS, warfarin, Cipro, Flagyl.  Labs reviewed: CBG 127-327 past 24 hrs, Chloride 115, CO2 16, BUN 48, Creatinine 1.86, Anion gap 4, Magnesium 1.4.  Discussed with RN. Patient does not like Ensure. He has now made himself a DNR.  NUTRITION - FOCUSED PHYSICAL EXAM:    Most Recent Value  Orbital Region  Moderate depletion  Upper Arm Region  Moderate depletion  Thoracic and Lumbar Region  Moderate depletion  Buccal Region  Moderate depletion  Temple Region  Moderate depletion  Clavicle Bone Region  Moderate depletion  Clavicle and Acromion Bone Region  Moderate depletion  Scapular Bone Region  Moderate depletion  Dorsal Hand  Mild depletion  Patellar Region  Moderate depletion  Anterior Thigh Region  Moderate depletion  Posterior Calf Region  Moderate depletion  Edema (RD Assessment)  Mild  Hair  Reviewed Charma Igo loss]  Eyes  Reviewed  Mouth  Reviewed  Skin  Reviewed  Nails  Reviewed     Diet Order:   Diet Order  Diet regular Room service appropriate? Yes; Fluid consistency: Thin  Diet effective now          EDUCATION NEEDS:   No education needs have been identified at this time  Skin:  Skin Assessment: Skin Integrity Issues: Skin Integrity Issues:: Stage II, Other (Comment) Stage II: left heel Other: MSAD to buttocks and scrotum; ecchymosis to bilateral arms  Last BM:  11/16/2017  Height:   Ht Readings from Last 1 Encounters:  11/15/17 5' 8"  (1.727 m)    Weight:    Wt Readings from Last 1 Encounters:  11/15/17 151 lb 10.8 oz (68.8 kg)    Ideal Body Weight:  70 kg  BMI:  Body mass index is 23.06 kg/m.  Estimated Nutritional Needs:   Kcal:  1670-1950 (MSJ x 1.2-1.4)  Protein:  85-95 grams (1.2-1.4 grams/kg)  Fluid:  1.7-2 L/day (25-30 mL/kg)  Willey Blade, MS, RD, LDN Office: [phone] Pager: [phone] After Hours/Weekend Pager: [phone]

## 2017-11-17 NOTE — Progress Notes (Addendum)
ANTICOAGULATION CONSULT NOTE - [place] for continuation of Warfarin Indication: atrial fibrillation  Allergies  Allergen Reactions  . Aspirin     GI Bleeding  . Codeine Other (See Comments)    BLISTERS Other reaction(s): Other (See Comments) BLISTERS   . Codeine Sulfate Other (See Comments)    Other reaction(s): Other (See Comments) BLISTERS BLISTERS    Patient Measurements: Height: 5\' 8"  (172.7 cm) Weight: 151 lb 10.8 oz (68.8 kg) IBW/kg (Calculated) : 68.4 Heparin Dosing Weight:    Vital Signs: Temp: 98 F (36.7 C) (07/28 0300) Temp Source: Oral (07/28 0300) BP: 122/66 (07/28 0700) Pulse Rate: 68 (07/28 0700)  Labs: Recent Labs    11/15/17 1352 11/15/17 1436 11/16/17 0443 11/17/17 0537  HGB 13.1  --  11.9* 10.5*  HCT 40.6  --  36.5* 31.2*  PLT 254  --  187 179  LABPROT  --  23.1* 26.7* 25.8*  INR  --  2.07 2.49 2.38  CREATININE 2.04*  --  1.84* 1.86*  CKTOTAL 101  --   --   --     Estimated Creatinine Clearance: 32.2 mL/min (A) (by C-G formula based on SCr of 1.86 mg/dL (H)).   Medical History: Past Medical History:  Diagnosis Date  . Anemia   . Anxiety   . Arthritis   . Cardiomyopathy ([place])    a. 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  Marland Kitchen Chronic combined systolic (congestive) and diastolic (congestive) heart failure ([place])    a. 05/2015 TEE: EF 35-40% (in setting of Afib).  . Chronic Pelvic pain   . Coronary artery disease    a. 05/2015 CTA Chest: 3 vessel coronary Ca2+.  . Depression   . Diabetes mellitus without complication ([place])   . Emphysema lung ([place])   . Gastric ulcer   . Gout   . Hyperlipidemia   . Hypertension   . Nephrolithiasis   . PAF (paroxysmal atrial fibrillation) ([place])    a. 05/2015 s/p DCCV; b. 03/2016 s/p DCCV; c. CHA2DS2VASc = 6 (coumadin).  . Prostate cancer [facility])    Prostate  . Stage III chronic kidney disease ([place])   . Weight loss    50 lb since Apr 24, 2015     Medications:  Patient states that he is taking Warfarin 2.5mg  daily (just restarted on Wednesday)  Assessment: Patient is a 77yo male being admitted for acute enteritis. Pharmacy is consulted for management of Warfarin. INR on admission is 2.07. Noted that patient will be receiving Cipro and Flagyl.  Goal of Therapy:  INR 2-3 Monitor platelets by anticoagulation protocol: Yes   Plan:  Will give warfarin 1.5 mg PO x 1 dose again.   Larene Beach, PharmD  11/17/2017 9:36 AM

## 2017-11-18 LAB — CBC
HCT: 31.4 % — ABNORMAL LOW (ref 40.0–52.0)
Hemoglobin: 10.6 g/dL — ABNORMAL LOW (ref 13.0–18.0)
MCH: 29.4 pg (ref 26.0–34.0)
MCHC: 33.7 g/dL (ref 32.0–36.0)
MCV: 87.2 fL (ref 80.0–100.0)
Platelets: 165 10*3/uL (ref 150–440)
RBC: 3.61 MIL/uL — ABNORMAL LOW (ref 4.40–5.90)
RDW: 22.6 % — ABNORMAL HIGH (ref 11.5–14.5)
WBC: 11.3 10*3/uL — ABNORMAL HIGH (ref 3.8–10.6)

## 2017-11-18 LAB — GLUCOSE, CAPILLARY
GLUCOSE-CAPILLARY: 150 mg/dL — AB (ref 70–99)
Glucose-Capillary: 75 mg/dL (ref 70–99)
Glucose-Capillary: 135 mg/dL — ABNORMAL HIGH (ref 70–99)
Glucose-Capillary: 129 mg/dL — ABNORMAL HIGH (ref 70–99)
Glucose-Capillary: 205 mg/dL — ABNORMAL HIGH (ref 70–99)

## 2017-11-18 LAB — PROTIME-INR
INR: 1.77
Prothrombin Time: 20.5 seconds — ABNORMAL HIGH (ref 11.4–15.2)

## 2017-11-18 MED ORDER — METRONIDAZOLE 500 MG PO TABS: 500.0000 mg | ORAL_TABLET | Freq: Three times a day (TID) | ORAL | Status: DC

## 2017-11-18 MED ORDER — CIPROFLOXACIN HCL 500 MG PO TABS
500.0000 mg | ORAL_TABLET | Freq: Two times a day (BID) | ORAL | Status: DC
  Administered 2017-11-18 – 2017-11-19 (×2): 500 mg via ORAL
  Filled 2017-11-18 (×3): qty 1

## 2017-11-18 MED ORDER — METRONIDAZOLE 500 MG PO TABS
500.0000 mg | ORAL_TABLET | Freq: Three times a day (TID) | ORAL | Status: DC
  Administered 2017-11-18 – 2017-11-19 (×2): 500 mg via ORAL
  Filled 2017-11-18 (×2): qty 1

## 2017-11-18 NOTE — Progress Notes (Signed)
ANTICOAGULATION CONSULT NOTE - [place] for continuation of Warfarin Indication: atrial fibrillation  Allergies  Allergen Reactions  . Aspirin     GI Bleeding  . Codeine Other (See Comments)    BLISTERS Other reaction(s): Other (See Comments) BLISTERS   . Codeine Sulfate Other (See Comments)    Other reaction(s): Other (See Comments) BLISTERS BLISTERS    Patient Measurements: Height: 5\' 8"  (172.7 cm) Weight: 151 lb 10.8 oz (68.8 kg) IBW/kg (Calculated) : 68.4 Heparin Dosing Weight:    Vital Signs: Temp: 98.5 F (36.9 C) (07/29 0911) Temp Source: Oral (07/29 0911) BP: 114/77 (07/29 0911) Pulse Rate: 81 (07/29 0911)  Labs: Recent Labs    11/15/17 1352  11/16/17 0443 11/17/17 0537 11/18/17 0343 11/18/17 0346  HGB 13.1  --  11.9* 10.5*  --  10.6*  HCT 40.6  --  36.5* 31.2*  --  31.4*  PLT 254  --  187 179  --  165  LABPROT  --    < > 26.7* 25.8* 20.5*  --   INR  --    < > 2.49 2.38 1.77  --   CREATININE 2.04*  --  1.84* 1.86*  --   --   CKTOTAL 101  --   --   --   --   --    < > = values in this interval not displayed.    Estimated Creatinine Clearance: 32.2 mL/min (A) (by C-G formula based on SCr of 1.86 mg/dL (H)).   Medical History: Past Medical History:  Diagnosis Date  . Anemia   . Anxiety   . Arthritis   . Cardiomyopathy ([place])    a. 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  Marland Kitchen Chronic combined systolic (congestive) and diastolic (congestive) heart failure ([place])    a. 05/2015 TEE: EF 35-40% (in setting of Afib).  . Chronic Pelvic pain   . Coronary artery disease    a. 05/2015 CTA Chest: 3 vessel coronary Ca2+.  . Depression   . Diabetes mellitus without complication ([place])   . Emphysema lung ([place])   . Gastric ulcer   . Gout   . Hyperlipidemia   . Hypertension   . Nephrolithiasis   . PAF (paroxysmal atrial fibrillation) ([place])    a. 05/2015 s/p DCCV; b. 03/2016 s/p DCCV; c. CHA2DS2VASc = 6 (coumadin).   . Prostate cancer [facility])    Prostate  . Stage III chronic kidney disease ([place])   . Weight loss    50 lb since Apr 24, 2015    Medications:  Patient states that he is taking Warfarin 2.5mg  daily (just restarted on Wednesday)  Assessment: Patient is a 77yo male being admitted for acute enteritis. Pharmacy is consulted for management of Warfarin. INR on admission is 2.07. Noted that patient will be receiving Cipro and Flagyl.  7/26 INR 2.07    Warfarin dose not given 7/27 INR 2.49    Warfarin 1.5mg  7/28 INR 2.38    Warfarin 1.5mg  7/29 INR 1.77  Goal of Therapy:  INR 2-3 Monitor platelets by anticoagulation protocol: Yes   Plan:  Will resume home dose of Warfarin 2.5mg  daily. Will check INR daily.  Paulina Fusi, PharmD, BCPS 11/18/2017 1:33 PM

## 2017-11-18 NOTE — Consult Note (Signed)
[facility] Nurse wound consult note Reason for Consult:Bilateral heel pressure injuries to heels.  Present on admission. Patient states his wife's home Health nurse has been advising care on these areas.  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 HAs been avoiding shoes due to this Dressing procedure/placement/frequency:Cleanse and moisturize heels daily.  Silicone foam to heels While in bed Prevalon boots, send to facility upon discharge.  Will not follow at this time.  Please re-consult if needed.  Domenic Moras RN BSN [place] Pager [phone]

## 2017-11-18 NOTE — Evaluation (Signed)
Physical Therapy Treatment Patient Details Name: Gregory Tyler MRN: [id number] DOB: [birth year] Today's Date: 11/18/2017   History of Present Illness  Pt is a 77 year old male admitted for enteritis s/p c/o abdominal pain.  Pt also has a stage 2 ulcer on L heel.  PMH includes stage III CKD, prostate CA, PAF, gout, cardiomyopathy and chronic pelvic pain.  Clinical Impression  Patient performed supine to sit and transfer to chair with +2 assist using rolling walker. Patient requires +1 mod/max assist for supine to sit and assistance with safety and postioning on edge of bed. Patient limited by pain in left LE ( heel) and weakness. Patient will continue to benefit from PT while here for improved functional mobility and improved independence with mobility.     Follow Up Recommendations SNF    Equipment Recommendations  None recommended by PT    Recommendations for Other Services       Precautions / Restrictions Precautions Precautions: Fall Restrictions Weight Bearing Restrictions: No      Mobility  Bed Mobility Overal bed mobility: Needs Assistance Bed Mobility: Supine to Sit     Supine to sit: Max assist     General bed mobility comments: Patient requires assistance moving LEs to edge and off bed. Requires assistance raising trunk off bed and getting postitioned safely on eob with both feet on floor.   Transfers Overall transfer level: Needs assistance Equipment used: Rolling walker (2 wheeled) Transfers: Sit to/from Stand Sit to Stand: +2 physical assistance;Mod assist         General transfer comment: patient requires mod assist +2 for safety with sit to stand transfers. Cues for upright posture. Cues for hand placement when performing transfers.   Ambulation/Gait Ambulation/Gait assistance: +2 physical assistance;Mod assist Gait Distance (Feet): 3 Feet Assistive device: Rolling walker (2 wheeled) Gait Pattern/deviations: Step-to pattern;Decreased step length -  right;Decreased step length - left Gait velocity: decreased   General Gait Details: patient able to ambulate 3 feet from bed to recliner with +2 assistance for safety. Patient able to weight shift, but requires increased time to do so. Very small, shuffle steps.    Stairs            Wheelchair Mobility    Modified Rankin (Stroke Patients Only)       Balance Overall balance assessment: Needs assistance Sitting-balance support: Feet supported;Bilateral upper extremity supported Sitting balance-Leahy Scale: Fair Sitting balance - Comments: patient requires assistance to get positoned safely sitting on the edge of the bed.   Postural control: Posterior lean Standing balance support: Bilateral upper extremity supported Standing balance-Leahy Scale: Fair                               Pertinent Vitals/Pain Pain Assessment: Faces Faces Pain Scale: Hurts little more Pain Location: B LEs when touched or moved Pain Descriptors / Indicators: Aching Pain Intervention(s): Limited activity within patient's tolerance;Monitored during session    Home Living                        Prior Function                 Hand Dominance        Extremity/Trunk Assessment                Communication      Cognition Arousal/Alertness: Awake/alert Behavior During Therapy: WFL for tasks assessed/performed  Overall Cognitive Status: Within Functional Limits for tasks assessed                                        General Comments      Exercises Other Exercises Other Exercises: LAQ while seated at EOB prior to ambulating. X5 reps bilaterally   Assessment/Plan    PT Assessment Patient needs continued PT services  PT Problem List Decreased strength;Pain;Decreased mobility;Decreased balance;Decreased activity tolerance;Decreased safety awareness       PT Treatment Interventions DME instruction;Gait training;Balance training;Functional  mobility training;Patient/family education;Therapeutic activities;Therapeutic exercise;Neuromuscular re-education    PT Goals (Current goals can be found in the Care Plan section)  Acute Rehab PT Goals Patient Stated Goal: patient states he is trying to get to [facility] where his wife is.   PT Goal Formulation: With patient Time For Goal Achievement: 12/07/17 Potential to Achieve Goals: Good    Frequency Min 2X/week   Barriers to discharge        Co-evaluation               AM-PAC PT "6 Clicks" Daily Activity  Outcome Measure Difficulty turning over in bed (including adjusting bedclothes, sheets and blankets)?: Unable Difficulty moving from lying on back to sitting on the side of the bed? : Unable Difficulty sitting down on and standing up from a chair with arms (e.g., wheelchair, bedside commode, etc,.)?: Unable Help needed moving to and from a bed to chair (including a wheelchair)?: A Lot Help needed walking in hospital room?: A Lot Help needed climbing 3-5 steps with a railing? : Total 6 Click Score: 8    End of Session Equipment Utilized During Treatment: Gait belt Activity Tolerance: Patient limited by pain;Patient limited by fatigue Patient left: in chair;with chair alarm set;with call bell/phone within reach Nurse Communication: Mobility status PT Visit Diagnosis: Other abnormalities of gait and mobility (R26.89);Muscle weakness (generalized) (M62.81)    Time: 3536-1443 PT Time Calculation (min) (ACUTE ONLY): 20 min   Charges:     PT Treatments $Gait Training: 8-22 mins        Shacora Zynda, PT, GCS 11/18/17,10:43 AM

## 2017-11-18 NOTE — Clinical Social Work Placement (Signed)
   CLINICAL SOCIAL WORK PLACEMENT  NOTE  Date:  11/18/2017  Patient Details  Name: Gregory Tyler MRN: [id number] Date of Birth: 1940/05/11  Clinical Social Work is seeking post-discharge placement for this patient at the [place] level of care (*CSW will initial, date and re-position this form in  chart as items are completed):  Yes   Patient/family provided with [place] Work Department's list of facilities offering this level of care within the geographic area requested by the patient (or if unable, by the patient's family).  Yes   Patient/family informed of their freedom to choose among providers that offer the needed level of care, that participate in Medicare, Medicaid or managed care program needed by the patient, have an available bed and are willing to accept the patient.  Yes   Patient/family informed of [facility]'s ownership interest in [facility] and [facility], as well as of the fact that they are under no obligation to receive care at these facilities.  PASRR submitted to EDS on 11/17/17     PASRR number received on 11/17/17     Existing PASRR number confirmed on       FL2 transmitted to all facilities in geographic area requested by pt/family on 11/16/17     FL2 transmitted to all facilities within larger geographic area on       Patient informed that his/her managed care company has contracts with or will negotiate with certain facilities, including the following:            Patient/family informed of bed offers received.  Patient chooses bed at       Physician recommends and patient chooses bed at      Patient to be transferred to   on  .  Patient to be transferred to facility by       Patient family notified on   of transfer.  Name of family member notified:        PHYSICIAN       Additional Comment:    _______________________________________________ Gregory Tyler, Gregory Beets, LCSW 11/18/2017, 8:32 AM

## 2017-11-18 NOTE — Plan of Care (Signed)
  Problem: Spiritual Needs Goal: Ability to function at adequate level Outcome: Progressing   Problem: Education: Goal: Knowledge of General Education information will improve Description Including pain rating scale, medication(s)/side effects and non-pharmacologic comfort measures Outcome: Progressing   Problem: Clinical Measurements: Goal: Ability to maintain clinical measurements within normal limits will improve Outcome: Progressing Goal: Will remain free from infection Outcome: Progressing   Problem: Nutrition: Goal: Adequate nutrition will be maintained Outcome: Progressing   Problem: Pain Managment: Goal: General experience of comfort will improve Outcome: Progressing   Problem: Safety: Goal: Ability to remain free from injury will improve Outcome: Progressing   Problem: Skin Integrity: Goal: Risk for impaired skin integrity will decrease Outcome: Progressing

## 2017-11-18 NOTE — Progress Notes (Signed)
Clinical Education officer, museum (CSW) met with patient and presented bed offers. Patient chose [facility]. Per patient his wife is at [facility] for rehab. [facility] admissions coordinator at [facility] is aware of accepted bed offer.   [employer], LCSW [phone]

## 2017-11-18 NOTE — Progress Notes (Signed)
[facility] at [place] NAME: Elaine Middleton    MR#:  [id number]  DATE OF BIRTH:  [birth year]  SUBJECTIVE:  CHIEF COMPLAINT:   Chief Complaint  Patient presents with  . Abdominal Pain  Patient seen and evaluated today No abdominal pain No diarrhea  Transferred to medical floor yesterday evening   REVIEW OF SYSTEMS:    ROS  CONSTITUTIONAL: No documented fever. No fatigue, weakness. No weight gain, no weight loss.  EYES: No blurry or double vision.  ENT: No tinnitus. No postnasal drip. No redness of the oropharynx.  RESPIRATORY: No cough, no wheeze, no hemoptysis. No dyspnea.  CARDIOVASCULAR: No chest pain. No orthopnea. No palpitations. No syncope.  GASTROINTESTINAL: No nausea, no vomiting, had diarrhea. Had no abdominal pain. No melena or hematochezia.  GENITOURINARY: No dysuria or hematuria.  ENDOCRINE: No polyuria or nocturia. No heat or cold intolerance.  HEMATOLOGY: No anemia. No bruising. No bleeding.  INTEGUMENTARY: No rashes. No lesions.  MUSCULOSKELETAL: No arthritis. No swelling. No gout.  NEUROLOGIC: No numbness, tingling, or ataxia. No seizure-type activity.  PSYCHIATRIC: No anxiety. No insomnia. No ADD.   DRUG ALLERGIES:   Allergies  Allergen Reactions  . Aspirin     GI Bleeding  . Codeine Other (See Comments)    BLISTERS Other reaction(s): Other (See Comments) BLISTERS   . Codeine Sulfate Other (See Comments)    Other reaction(s): Other (See Comments) BLISTERS BLISTERS    VITALS:  Blood pressure 114/77, pulse 81, temperature 98.5 F (36.9 C), temperature source Oral, resp. rate 20, height 5\' 8"  (1.727 m), weight 68.8 kg (151 lb 10.8 oz), SpO2 96 %.  PHYSICAL EXAMINATION:   Physical Exam  GENERAL:  77 y.o.-year-old patient lying in the bed with no acute distress.  EYES: Pupils equal, round, reactive to light and accommodation. No scleral icterus. Extraocular muscles intact.  HEENT: Head atraumatic,  normocephalic. Oropharynx and nasopharynx clear.  NECK:  Supple, no jugular venous distention. No thyroid enlargement, no tenderness.  LUNGS: Normal breath sounds bilaterally, no wheezing, rales, rhonchi. No use of accessory muscles of respiration.  CARDIOVASCULAR: S1, S2 normal. No murmurs, rubs, or gallops.  ABDOMEN: Soft, nontender, nondistended. Bowel sounds present. No organomegaly or mass.  EXTREMITIES: No cyanosis, Has edema   NEUROLOGIC: Cranial nerves II through XII are intact. No focal Motor or sensory deficits b/l.   PSYCHIATRIC: The patient is alert and oriented x 3.  SKIN: No obvious rash, lesion, or ulcer.   LABORATORY PANEL:   CBC Recent Labs  Lab 11/18/17 0346  WBC 11.3*  HGB 10.6*  HCT 31.4*  PLT 165   ------------------------------------------------------------------------------------------------------------------ Chemistries  Recent Labs  Lab 11/15/17 1352  11/17/17 0537  NA 134*   < > 135  K 5.6*   < > 4.4  CL 109   < > 115*  CO2 16*   < > 16*  GLUCOSE 187*   < > 164*  BUN 48*   < > 48*  CREATININE 2.04*   < > 1.86*  CALCIUM 8.2*   < > 7.4*  MG  --   --  1.4*  AST 29  --   --   ALT 26  --   --   ALKPHOS 79  --   --   BILITOT 0.8  --   --    < > = values in this interval not displayed.   ------------------------------------------------------------------------------------------------------------------  Cardiac Enzymes No results for input(s): TROPONINI in the last  168 hours. ------------------------------------------------------------------------------------------------------------------  RADIOLOGY:  No results found.   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 Management plans discussed with the patient, family and they are in agreement.  CODE STATUS: Full code  DVT Prophylaxis: SCDs  TOTAL TIME TAKING CARE OF THIS PATIENT: 34 minutes.   POSSIBLE D/C IN 2 to 3 DAYS, DEPENDING ON CLINICAL CONDITION.  Saundra Shelling M.D on 11/18/2017 at 2:39 PM  Between 7am to 6pm - Pager - [phone]  After 6pm go to [email or web address] - password [email or web address] [facility]  Office  [phone]  CC: Primary care physician; [name], MD  Note: This dictation was prepared with Dragon dictation along with smaller phrase technology. Any transcriptional errors that result from this process are unintentional.

## 2017-11-18 NOTE — Care Management Important Message (Signed)
Important Message  Patient Details  Name: MARKEESE BOYAJIAN MRN: [id number] Date of Birth: Dec 24, 1940   Medicare Important Message Given:  Yes    Juliann Pulse A Yamilette Garretson 11/18/2017, 11:36 AM

## 2017-11-19 LAB — GLUCOSE, CAPILLARY
Glucose-Capillary: 84 mg/dL (ref 70–99)
Glucose-Capillary: 137 mg/dL — ABNORMAL HIGH (ref 70–99)

## 2017-11-19 LAB — PROTIME-INR
INR: 1.75
Prothrombin Time: 20.3 seconds — ABNORMAL HIGH (ref 11.4–15.2)

## 2017-11-19 MED ORDER — HYDROCODONE-ACETAMINOPHEN 5-325 MG PO TABS: 1.0000 | ORAL_TABLET | ORAL | 0 refills | Status: AC | PRN

## 2017-11-19 MED ORDER — METRONIDAZOLE 500 MG PO TABS: 500.0000 mg | ORAL_TABLET | Freq: Three times a day (TID) | ORAL | 0 refills | Status: AC

## 2017-11-19 MED ORDER — CIPROFLOXACIN HCL 500 MG PO TABS: 500.0000 mg | ORAL_TABLET | Freq: Two times a day (BID) | ORAL | 0 refills | Status: AC

## 2017-11-19 NOTE — Clinical Social Work Placement (Signed)
   CLINICAL SOCIAL WORK PLACEMENT  NOTE  Date:  11/19/2017  Patient Details  Name: Gregory Tyler MRN: [id number] Date of Birth: 12/04/1940  Clinical Social Work is seeking post-discharge placement for this patient at the [place] level of care (*CSW will initial, date and re-position this form in  chart as items are completed):  Yes   Patient/family provided with [place] Work Department's list of facilities offering this level of care within the geographic area requested by the patient (or if unable, by the patient's family).  Yes   Patient/family informed of their freedom to choose among providers that offer the needed level of care, that participate in Medicare, Medicaid or managed care program needed by the patient, have an available bed and are willing to accept the patient.  Yes   Patient/family informed of [facility]'s ownership interest in [facility] and [facility], as well as of the fact that they are under no obligation to receive care at these facilities.  PASRR submitted to EDS on 11/17/17     PASRR number received on 11/17/17     Existing PASRR number confirmed on       FL2 transmitted to all facilities in geographic area requested by pt/family on 11/16/17     FL2 transmitted to all facilities within larger geographic area on       Patient informed that his/her managed care company has contracts with or will negotiate with certain facilities, including the following:        Yes   Patient/family informed of bed offers received.  Patient chooses bed at [facility] )     Physician recommends and patient chooses bed at      Patient to be transferred to [facility] ) on 11/19/17.  Patient to be transferred to facility by [facility] )     Patient family notified on 11/19/17 of transfer.  Name of family member notified:  (Patient's son [name] is aware of D/C today. )     PHYSICIAN       Additional  Comment:    _______________________________________________ Ruhani Umland, Veronia Beets, LCSW 11/19/2017, 11:22 AM

## 2017-11-19 NOTE — Discharge Summary (Addendum)
[facility] at [place] NAME: Gregory Tyler    MR#:  [id number]  DATE OF BIRTH:  1941/02/26  DATE OF ADMISSION:  11/15/2017 ADMITTING PHYSICIAN: Loletha Grayer, MD  DATE OF DISCHARGE: 11/19/2017  PRIMARY CARE PHYSICIAN: [name], MD   ADMISSION DIAGNOSIS:  Enteritis [K52.9] Bandemia [D72.825] Generalized weakness [R53.1] Acute kidney injury superimposed on chronic kidney disease ([place]) [N17.9, N18.9] Hyperkalemia DISCHARGE DIAGNOSIS:  Active Problems:   Enteritis   Pressure injury of skin Hypotension Chronic atrial fibrillation Hyperlipidemia stage II decubitus left heel Sepsis secondary to enteritis Hyperkalemia SECONDARY DIAGNOSIS:   Past Medical History:  Diagnosis Date  . Anemia   . Anxiety   . Arthritis   . Cardiomyopathy ([place])    a. 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  Mar[place] Kitchen Chronic combined systolic (congestive) and diastolic (congestive) heart failure ([place])    a. 05/2015 TEE: EF 35-40% (in setting of Afib).  . Chronic Pelvic pain   . Coronary artery disease    a. 05/2015 CTA Chest: 3 vessel coronary Ca2+.  . Depression   . Diabetes mellitus without complication ([place])   . Emphysema lung ([place])   . Gastric ulcer   . Gout   . Hyperlipidemia   . Hypertension   . Nephrolithiasis   . PAF (paroxysmal atrial fibrillation) ([place])    a. 05/2015 s/p DCCV; b. 03/2016 s/p DCCV; c. CHA2DS2VASc = 6 (coumadin).  . Prostate cancer [facility])    Prostate  . Stage III chronic kidney disease ([place])   . Weight loss    50 lb since Apr 24, 2015     ADMITTING HISTORY Gregory Tyler  is a 77 y.o. male stated Dr. Doy Hutching sent him to the hospital.  Patient complaining of 200 out of 10 abdominal pain.  The abdominal pain goes on and off but lasted all night last night.  He had one episode of where he felt like he was going to vomit.  The pain then eases off.  He has been unable to walk for the last 3 or 4 days.  Complains  of some back and leg pain.  Had 6 episodes of diarrhea.  He is living by himself since his wife is at [employer].  3 months ago he did have an antibiotic.  Today he forgot to eat breakfast and take his medications.  In the ER, he was found to have an enteritis on CT scan and a white blood cell count of 25,000 so hospitalist services were contacted for further evaluation.  HOSPITAL COURSE:  Patient was admitted to stepdown unit.  Patient received IV ciprofloxacin and Flagyl antibiotics.  Stool for C. difficile toxin was negative. Hyperkalemia was treated.  Patient was hypotensive in the stepdown unit and was put on IV pressor medication patient's leukocytosis improved and.  He also had acute on chronic kidney injury, he was weaned off IV pressor medication and transferred to medical floor patient was hydrated with IV fluids.  He continued Coumadin for anticoagulation. Leukocytosis improved kidney functions also improved patient received physical therapy.  Patient unable to take care of himself at home.  SNF placement was recommended.  Patient is enteritis completely resolved. Continue heel protector pads  CONSULTS OBTAINED:  Intesivist consult  DRUG ALLERGIES:   Allergies  Allergen Reactions  . Aspirin     GI Bleeding  . Codeine Other (See Comments)    BLISTERS Other reaction(s): Other (See Comments) BLISTERS   .  Codeine Sulfate Other (See Comments)    Other reaction(s): Other (See Comments) BLISTERS BLISTERS    DISCHARGE MEDICATIONS:   Allergies as of 11/19/2017      Reactions   Aspirin    GI Bleeding   Codeine Other (See Comments)   BLISTERS Other reaction(s): Other (See Comments) BLISTERS   Codeine Sulfate Other (See Comments)   Other reaction(s): Other (See Comments) BLISTERS BLISTERS      Medication List    TAKE these medications   acetaminophen 325 MG tablet Commonly known as:  TYLENOL Take 2 tablets (650 mg total) by mouth every 6 (six) hours as needed for  mild pain (or Fever >/= 101).   albuterol 108 (90 Base) MCG/ACT inhaler Commonly known as:  PROVENTIL HFA;VENTOLIN HFA Inhale 2 puffs into the lungs every 6 (six) hours as needed for wheezing or shortness of breath.   amiodarone 200 MG tablet Commonly known as:  PACERONE TAKE 1 TABLET BY MOUTH ONCE DAILY   ammonium lactate 12 % lotion Commonly known as:  LAC-HYDRIN Apply twice a day to feet   atorvastatin 20 MG tablet Commonly known as:  LIPITOR TAKE 1 TABLET BY MOUTH ONCE DAILY   ciprofloxacin 500 MG tablet Commonly known as:  CIPRO Take 1 tablet (500 mg total) by mouth 2 (two) times daily for 4 days.   feeding supplement (ENSURE ENLIVE) Liqd Take 237 mLs by mouth 2 (two) times daily between meals.   furosemide 20 MG tablet Commonly known as:  LASIX Take 40mg  in the morning and 20 mg in the afternoon   gabapentin 100 MG capsule Commonly known as:  NEURONTIN Take 1 capsule (100 mg total) by mouth at bedtime.   HYDROcodone-acetaminophen 5-325 MG tablet Commonly known as:  NORCO/VICODIN Take 1 tablet by mouth every 4 (four) hours as needed for moderate pain.   lisinopril 5 MG tablet Commonly known as:  PRINIVIL,ZESTRIL Take 1 tablet (5 mg total) by mouth daily.   Medical Compression Thigh High Misc 1 application by Does not apply route daily.   metFORMIN 500 MG tablet Commonly known as:  GLUCOPHAGE Take 500 mg by mouth 2 (two) times daily with a meal.   metoprolol succinate 25 MG 24 hr tablet Commonly known as:  TOPROL-XL Take 0.5 tablets (12.5 mg total) by mouth daily.   metroNIDAZOLE 500 MG tablet Commonly known as:  FLAGYL Take 1 tablet (500 mg total) by mouth every 8 (eight) hours for 4 days.   sodium chloride 0.65 % Soln nasal spray Commonly known as:  OCEAN Place 1 spray into both nostrils as needed for congestion.   SYMBICORT 160-4.5 MCG/ACT inhaler Generic drug:  budesonide-formoterol Inhale 2 puffs into the lungs 2 (two) times daily.   warfarin 4  MG tablet Commonly known as:  COUMADIN Take as directed. If you are unsure how to take this medication, talk to your nurse or doctor. Original instructions:  Take 1 tablet (4 mg total) by mouth daily. What changed:  how much to take       Today  Patient seen and evaluated today No fever No chest pain No shortness of breath  VITAL SIGNS:  Blood pressure 121/66, pulse 69, temperature 97.9 F (36.6 C), temperature source Oral, resp. rate 18, height 5\' 8"  (1.727 m), weight 68.8 kg (151 lb 10.8 oz), SpO2 99 %.  I/O:    Intake/Output Summary (Last 24 hours) at 11/19/2017 0955 Last data filed at 11/19/2017 0537 Gross per 24 hour  Intake 600 ml  Output 875 ml  Net -275 ml    PHYSICAL EXAMINATION:  Physical Exam  GENERAL:  77 y.o.-year-old patient lying in the bed with no acute distress.  LUNGS: Normal breath sounds bilaterally, no wheezing, rales,rhonchi or crepitation. No use of accessory muscles of respiration.  CARDIOVASCULAR: S1, S2 normal. No murmurs, rubs, or gallops.  ABDOMEN: Soft, non-tender, non-distended. Bowel sounds present. No organomegaly or mass.  NEUROLOGIC: Moves all 4 extremities. PSYCHIATRIC: The patient is alert and oriented x 3.  SKIN: No obvious rash, lesion, or ulcer.   DATA REVIEW:   CBC Recent Labs  Lab 11/18/17 0346  WBC 11.3*  HGB 10.6*  HCT 31.4*  PLT 165    Chemistries  Recent Labs  Lab 11/15/17 1352  11/17/17 0537  NA 134*   < > 135  K 5.6*   < > 4.4  CL 109   < > 115*  CO2 16*   < > 16*  GLUCOSE 187*   < > 164*  BUN 48*   < > 48*  CREATININE 2.04*   < > 1.86*  CALCIUM 8.2*   < > 7.4*  MG  --   --  1.4*  AST 29  --   --   ALT 26  --   --   ALKPHOS 79  --   --   BILITOT 0.8  --   --    < > = values in this interval not displayed.    Cardiac Enzymes No results for input(s): TROPONINI in the last 168 hours.  Microbiology Results  Results for orders placed or performed during the hospital encounter of 11/15/17  C  difficile quick scan w PCR reflex     Status: None   Collection Time: 11/15/17  3:03 PM  Result Value Ref Range Status   C Diff antigen NEGATIVE NEGATIVE Final   C Diff toxin NEGATIVE NEGATIVE Final   C Diff interpretation No C. difficile detected.  Final    Comment: Performed at [facility], [address] [place], [place] [address]  Gastrointestinal Panel by PCR , Stool     Status: None   Collection Time: 11/15/17  3:13 PM  Result Value Ref Range Status   Campylobacter species NOT DETECTED NOT DETECTED Final   Plesimonas shigelloides NOT DETECTED NOT DETECTED Final   Salmonella species NOT DETECTED NOT DETECTED Final   Yersinia enterocolitica NOT DETECTED NOT DETECTED Final   Vibrio species NOT DETECTED NOT DETECTED Final   Vibrio cholerae NOT DETECTED NOT DETECTED Final   Enteroaggregative E coli (EAEC) NOT DETECTED NOT DETECTED Final   Enteropathogenic E coli (EPEC) NOT DETECTED NOT DETECTED Final   Enterotoxigenic E coli (ETEC) NOT DETECTED NOT DETECTED Final   Shiga like toxin producing E coli (STEC) NOT DETECTED NOT DETECTED Final   Shigella/Enteroinvasive E coli (EIEC) NOT DETECTED NOT DETECTED Final   Cryptosporidium NOT DETECTED NOT DETECTED Final   Cyclospora cayetanensis NOT DETECTED NOT DETECTED Final   Entamoeba histolytica NOT DETECTED NOT DETECTED Final   Giardia lamblia NOT DETECTED NOT DETECTED Final   Adenovirus F40/41 NOT DETECTED NOT DETECTED Final   Astrovirus NOT DETECTED NOT DETECTED Final   Norovirus GI/GII NOT DETECTED NOT DETECTED Final   Rotavirus A NOT DETECTED NOT DETECTED Final   Sapovirus (I, II, IV, and V) NOT DETECTED NOT DETECTED Final    Comment: Performed at [facility], [address] [place], [place] [address]  MRSA PCR Screening  Status: None   Collection Time: 11/16/17  2:03 AM  Result Value Ref Range Status   MRSA by PCR NEGATIVE NEGATIVE Final    Comment:        The GeneXpert MRSA Assay (FDA approved for NASAL  specimens only), is one component of a comprehensive MRSA colonization surveillance program. It is not intended to diagnose MRSA infection nor to guide or monitor treatment for MRSA infections. Performed at [facility], [address] [place], [place] [address]     RADIOLOGY:  No results found.  Follow up with PCP in 1 week.  Management plans discussed with the patient, family and they are in agreement.  CODE STATUS: DNR    Code Status Orders  (From admission, onward)        Start     Ordered   11/17/17 1358  Do not attempt resuscitation (DNR)  Continuous    Question Answer Comment  In the event of cardiac or respiratory ARREST Do not call a "code blue"   In the event of cardiac or respiratory ARREST Do not perform Intubation, CPR, defibrillation or ACLS   In the event of cardiac or respiratory ARREST Use medication by any route, position, wound care, and other measures to relive pain and suffering. May use oxygen, suction and manual treatment of airway obstruction as needed for comfort.      11/17/17 1357    Code Status History    Date Active Date Inactive Code Status Order ID Comments User Context   11/15/2017 1550 11/17/2017 1357 Full Code [id number]  Loletha Grayer, MD ED   09/11/2017 1951 09/15/2017 1603 Full Code [id number]  Bettey Costa, MD ED   05/11/2015 1307 05/13/2015 1256 Full Code [id number]  Aldean Jewett, MD Inpatient   04/25/2015 0149 04/27/2015 1741 Full Code [id number]  Hillary Bow, MD ED      TOTAL TIME TAKING CARE OF THIS PATIENT ON DAY OF DISCHARGE: more than 35 minutes.   Saundra Shelling M.D on 11/19/2017 at 9:55 AM  Between 7am to 6pm - Pager - [phone]  After 6pm go to [email or web address] - password [email or web address] [facility]  Office  [phone]  CC: Primary care physician; [name], MD  Note: This dictation was prepared with Dragon dictation along with smaller phrase technology. Any transcriptional errors  that result from this process are unintentional.

## 2017-11-19 NOTE — Progress Notes (Signed)
ANTICOAGULATION CONSULT NOTE - [place] for continuation of Warfarin Indication: atrial fibrillation  Allergies  Allergen Reactions  . Aspirin     GI Bleeding  . Codeine Other (See Comments)    BLISTERS Other reaction(s): Other (See Comments) BLISTERS   . Codeine Sulfate Other (See Comments)    Other reaction(s): Other (See Comments) BLISTERS BLISTERS    Patient Measurements: Height: 5\' 8"  (172.7 cm) Weight: 151 lb 10.8 oz (68.8 kg) IBW/kg (Calculated) : 68.4 Heparin Dosing Weight:    Vital Signs: Temp: 98.2 F (36.8 C) (07/30 0300) Temp Source: Oral (07/30 0300) BP: 97/59 (07/30 0300) Pulse Rate: 68 (07/30 0300)  Labs: Recent Labs    11/17/17 0537 11/18/17 0343 11/18/17 0346 11/19/17 0346  HGB 10.5*  --  10.6*  --   HCT 31.2*  --  31.4*  --   PLT 179  --  165  --   LABPROT 25.8* 20.5*  --  20.3*  INR 2.38 1.77  --  1.75  CREATININE 1.86*  --   --   --     Estimated Creatinine Clearance: 32.2 mL/min (A) (by C-G formula based on SCr of 1.86 mg/dL (H)).   Medical History: Past Medical History:  Diagnosis Date  . Anemia   . Anxiety   . Arthritis   . Cardiomyopathy ([place])    a. 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  Marland Kitchen Chronic combined systolic (congestive) and diastolic (congestive) heart failure ([place])    a. 05/2015 TEE: EF 35-40% (in setting of Afib).  . Chronic Pelvic pain   . Coronary artery disease    a. 05/2015 CTA Chest: 3 vessel coronary Ca2+.  . Depression   . Diabetes mellitus without complication ([place])   . Emphysema lung ([place])   . Gastric ulcer   . Gout   . Hyperlipidemia   . Hypertension   . Nephrolithiasis   . PAF (paroxysmal atrial fibrillation) ([place])    a. 05/2015 s/p DCCV; b. 03/2016 s/p DCCV; c. CHA2DS2VASc = 6 (coumadin).  . Prostate cancer [facility])    Prostate  . Stage III chronic kidney disease ([place])   . Weight loss    50 lb since Apr 24, 2015    Medications:  Patient states that  he is taking Warfarin 2.5mg  daily (just restarted on Wednesday)  Assessment: Patient is a 77yo male being admitted for acute enteritis. Pharmacy is consulted for management of Warfarin. INR on admission is 2.07.  Patient on amiodarone (PTA med also) Noted that patient will be receiving Cipro and Flagyl.  7/26 INR 2.07    Warfarin dose not given 7/27 INR 2.49    Warfarin 1.5mg  7/28 INR 2.38    Warfarin 1.5mg  7/29 INR 1.77    2.5 mg 7/30 INR 1.75  Goal of Therapy:  INR 2-3 Monitor platelets by anticoagulation protocol: Yes   Plan:  Will continue home dose of Warfarin 2.5mg  daily. Watch drug interactions. Will check INR daily.  Chinita Greenland PharmD Clinical Pharmacist 11/19/2017

## 2017-11-19 NOTE — Progress Notes (Signed)
Patient is medically stable for D/C to [facility] today. Per [facility] admissions coordinator at [facility] patient can come today to room 505. RN will call report and arrange EMS for transport. Clinical Education officer, museum (CSW) sent D/C orders to [facility] via [facility] [address]. Patient is aware of above. CSW contacted patient's son [name] and made him aware of above. Please reconsult if future social work needs arise. CSW signing off.   [employer], LCSW [phone]

## 2017-11-19 NOTE — Progress Notes (Signed)
Called report to [place] at [facility]. Answered all questions. Rported skin issues. EMS called for transport

## 2017-11-22 ENCOUNTER — Other Ambulatory Visit: Payer: Self-pay | Admitting: Internal Medicine

## 2017-11-22 DIAGNOSIS — M4800 Spinal stenosis, site unspecified: Secondary | ICD-10-CM

## 2017-12-06 ENCOUNTER — Ambulatory Visit
Admission: RE | Admit: 2017-12-06 | Discharge: 2017-12-06 | Disposition: A | Discharge status: Home / Self Care | Payer: Medicare Other | Source: Ambulatory Visit | Attending: Internal Medicine | Admitting: Internal Medicine

## 2017-12-06 DIAGNOSIS — M48061 Spinal stenosis, lumbar region without neurogenic claudication: Secondary | ICD-10-CM | POA: Insufficient documentation

## 2017-12-06 DIAGNOSIS — M4800 Spinal stenosis, site unspecified: Secondary | ICD-10-CM

## 2017-12-06 DIAGNOSIS — M2578 Osteophyte, vertebrae: Secondary | ICD-10-CM | POA: Insufficient documentation

## 2017-12-06 DIAGNOSIS — R531 Weakness: Secondary | ICD-10-CM | POA: Diagnosis present

## 2017-12-06 DIAGNOSIS — M4802 Spinal stenosis, cervical region: Secondary | ICD-10-CM | POA: Diagnosis not present

## 2017-12-06 DIAGNOSIS — M50221 Other cervical disc displacement at C4-C5 level: Secondary | ICD-10-CM | POA: Insufficient documentation

## 2017-12-06 MED ORDER — GADOBENATE DIMEGLUMINE 529 MG/ML IV SOLN
14.0000 mL | Freq: Once | INTRAVENOUS | Status: AC | PRN
  Administered 2017-12-06: 14 mL via INTRAVENOUS

## 2017-12-19 ENCOUNTER — Inpatient Hospital Stay
Admission: EM | Admit: 2017-12-19 | Discharge: 2017-12-20 | DRG: 637 | Disposition: A | Discharge status: Skilled Nursing Facility | Payer: Medicare Other | Attending: Internal Medicine | Admitting: Internal Medicine

## 2017-12-19 ENCOUNTER — Encounter: Payer: Self-pay | Admitting: *Deleted

## 2017-12-19 ENCOUNTER — Other Ambulatory Visit: Payer: Self-pay

## 2017-12-19 DIAGNOSIS — Z8546 Personal history of malignant neoplasm of prostate: Secondary | ICD-10-CM

## 2017-12-19 DIAGNOSIS — R197 Diarrhea, unspecified: Secondary | ICD-10-CM | POA: Diagnosis present

## 2017-12-19 DIAGNOSIS — M1990 Unspecified osteoarthritis, unspecified site: Secondary | ICD-10-CM | POA: Diagnosis present

## 2017-12-19 DIAGNOSIS — I429 Cardiomyopathy, unspecified: Secondary | ICD-10-CM | POA: Diagnosis not present

## 2017-12-19 DIAGNOSIS — I130 Hypertensive heart and chronic kidney disease with heart failure and stage 1 through stage 4 chronic kidney disease, or unspecified chronic kidney disease: Secondary | ICD-10-CM | POA: Diagnosis present

## 2017-12-19 DIAGNOSIS — Z79899 Other long term (current) drug therapy: Secondary | ICD-10-CM

## 2017-12-19 DIAGNOSIS — F329 Major depressive disorder, single episode, unspecified: Secondary | ICD-10-CM | POA: Diagnosis not present

## 2017-12-19 DIAGNOSIS — M109 Gout, unspecified: Secondary | ICD-10-CM | POA: Diagnosis not present

## 2017-12-19 DIAGNOSIS — Z8249 Family history of ischemic heart disease and other diseases of the circulatory system: Secondary | ICD-10-CM

## 2017-12-19 DIAGNOSIS — M545 Low back pain: Secondary | ICD-10-CM | POA: Diagnosis present

## 2017-12-19 DIAGNOSIS — E11621 Type 2 diabetes mellitus with foot ulcer: Principal | ICD-10-CM | POA: Diagnosis present

## 2017-12-19 DIAGNOSIS — I2510 Atherosclerotic heart disease of native coronary artery without angina pectoris: Secondary | ICD-10-CM | POA: Diagnosis present

## 2017-12-19 DIAGNOSIS — Z7984 Long term (current) use of oral hypoglycemic drugs: Secondary | ICD-10-CM

## 2017-12-19 DIAGNOSIS — I5042 Chronic combined systolic (congestive) and diastolic (congestive) heart failure: Secondary | ICD-10-CM | POA: Diagnosis not present

## 2017-12-19 DIAGNOSIS — Z6823 Body mass index (BMI) 23.0-23.9, adult: Secondary | ICD-10-CM

## 2017-12-19 DIAGNOSIS — L89153 Pressure ulcer of sacral region, stage 3: Secondary | ICD-10-CM | POA: Diagnosis present

## 2017-12-19 DIAGNOSIS — R627 Adult failure to thrive: Secondary | ICD-10-CM | POA: Diagnosis present

## 2017-12-19 DIAGNOSIS — E785 Hyperlipidemia, unspecified: Secondary | ICD-10-CM | POA: Diagnosis not present

## 2017-12-19 DIAGNOSIS — N183 Chronic kidney disease, stage 3 (moderate): Secondary | ICD-10-CM | POA: Diagnosis present

## 2017-12-19 DIAGNOSIS — Z7901 Long term (current) use of anticoagulants: Secondary | ICD-10-CM

## 2017-12-19 DIAGNOSIS — L89612 Pressure ulcer of right heel, stage 2: Secondary | ICD-10-CM | POA: Diagnosis not present

## 2017-12-19 DIAGNOSIS — L89622 Pressure ulcer of left heel, stage 2: Secondary | ICD-10-CM | POA: Diagnosis present

## 2017-12-19 DIAGNOSIS — L97519 Non-pressure chronic ulcer of other part of right foot with unspecified severity: Secondary | ICD-10-CM | POA: Diagnosis not present

## 2017-12-19 DIAGNOSIS — Z7951 Long term (current) use of inhaled steroids: Secondary | ICD-10-CM

## 2017-12-19 DIAGNOSIS — F419 Anxiety disorder, unspecified: Secondary | ICD-10-CM | POA: Diagnosis not present

## 2017-12-19 DIAGNOSIS — N179 Acute kidney failure, unspecified: Secondary | ICD-10-CM | POA: Diagnosis not present

## 2017-12-19 DIAGNOSIS — R634 Abnormal weight loss: Secondary | ICD-10-CM | POA: Diagnosis present

## 2017-12-19 DIAGNOSIS — Z66 Do not resuscitate: Secondary | ICD-10-CM | POA: Diagnosis present

## 2017-12-19 DIAGNOSIS — E875 Hyperkalemia: Secondary | ICD-10-CM | POA: Diagnosis present

## 2017-12-19 DIAGNOSIS — Z87891 Personal history of nicotine dependence: Secondary | ICD-10-CM

## 2017-12-19 DIAGNOSIS — G8929 Other chronic pain: Secondary | ICD-10-CM | POA: Diagnosis not present

## 2017-12-19 LAB — URINALYSIS, COMPLETE (UACMP) WITH MICROSCOPIC
Bilirubin Urine: NEGATIVE
Glucose, UA: NEGATIVE mg/dL
Ketones, ur: NEGATIVE mg/dL
Leukocytes, UA: NEGATIVE
Nitrite: NEGATIVE
PH: 5 (ref 5.0–8.0)
Protein, ur: NEGATIVE mg/dL
SPECIFIC GRAVITY, URINE: 1.016 (ref 1.005–1.030)

## 2017-12-19 LAB — COMPREHENSIVE METABOLIC PANEL
ALBUMIN: 2.7 g/dL — AB (ref 3.5–5.0)
ALT: 17 U/L (ref 0–44)
AST: 24 U/L (ref 15–41)
Alkaline Phosphatase: 98 U/L (ref 38–126)
Anion gap: 11 (ref 5–15)
BILIRUBIN TOTAL: 0.8 mg/dL (ref 0.3–1.2)
BUN: 42 mg/dL — AB (ref 8–23)
CHLORIDE: 112 mmol/L — AB (ref 98–111)
CO2: 19 mmol/L — ABNORMAL LOW (ref 22–32)
Calcium: 8.6 mg/dL — ABNORMAL LOW (ref 8.9–10.3)
Creatinine, Ser: 1.53 mg/dL — ABNORMAL HIGH (ref 0.61–1.24)
GFR calc Af Amer: 49 mL/min — ABNORMAL LOW (ref >60)
GFR calc non Af Amer: 42 mL/min — ABNORMAL LOW (ref >60)
GLUCOSE: 116 mg/dL — AB (ref 70–99)
POTASSIUM: 5.4 mmol/L — AB (ref 3.5–5.1)
Sodium: 142 mmol/L (ref 135–145)
TOTAL PROTEIN: 6.1 g/dL — AB (ref 6.5–8.1)

## 2017-12-19 LAB — CBC
HEMATOCRIT: 32.5 % — AB (ref 40.0–52.0)
Hemoglobin: 10.7 g/dL — ABNORMAL LOW (ref 13.0–18.0)
MCH: 30.6 pg (ref 26.0–34.0)
MCHC: 32.9 g/dL (ref 32.0–36.0)
MCV: 93.2 fL (ref 80.0–100.0)
Platelets: 314 10*3/uL (ref 150–440)
RBC: 3.49 MIL/uL — ABNORMAL LOW (ref 4.40–5.90)
RDW: 18.8 % — AB (ref 11.5–14.5)
WBC: 14.4 10*3/uL — ABNORMAL HIGH (ref 3.8–10.6)

## 2017-12-19 LAB — LIPASE, BLOOD: Lipase: 43 U/L (ref 11–51)

## 2017-12-19 MED ORDER — SODIUM CHLORIDE 0.9 % IV SOLN
INTRAVENOUS | Status: DC
  Administered 2017-12-19: 50 mL/h via INTRAVENOUS

## 2017-12-19 MED ORDER — GABAPENTIN 100 MG PO CAPS
100.0000 mg | ORAL_CAPSULE | Freq: Every day | ORAL | Status: DC
  Administered 2017-12-19: 22:00:00 100 mg via ORAL
  Filled 2017-12-19: qty 1

## 2017-12-19 MED ORDER — SODIUM CHLORIDE 0.9 % IV SOLN: INTRAVENOUS | Status: DC

## 2017-12-19 MED ORDER — METOPROLOL TARTRATE 50 MG PO TABS
50.0000 mg | ORAL_TABLET | Freq: Two times a day (BID) | ORAL | Status: DC
  Administered 2017-12-19: 22:00:00 50 mg via ORAL
  Filled 2017-12-19 (×2): qty 1

## 2017-12-19 MED ORDER — PATIROMER SORBITEX CALCIUM 8.4 G PO PACK
8.4000 g | PACK | Freq: Every day | ORAL | Status: DC
  Administered 2017-12-19: 8.4 g via ORAL
  Filled 2017-12-19 (×4): qty 1

## 2017-12-19 MED ORDER — FENTANYL CITRATE (PF) 100 MCG/2ML IJ SOLN
50.0000 ug | Freq: Once | INTRAMUSCULAR | Status: AC
  Administered 2017-12-19: 50 ug via INTRAVENOUS
  Filled 2017-12-19: qty 2

## 2017-12-19 MED ORDER — ACETAMINOPHEN 325 MG PO TABS: 650.0000 mg | ORAL_TABLET | Freq: Four times a day (QID) | ORAL | Status: DC | PRN

## 2017-12-19 MED ORDER — ROSUVASTATIN CALCIUM 20 MG PO TABS
20.0000 mg | ORAL_TABLET | Freq: Every day | ORAL | Status: DC
  Administered 2017-12-20: 10:00:00 20 mg via ORAL
  Filled 2017-12-19: qty 1

## 2017-12-19 MED ORDER — ONDANSETRON HCL 4 MG PO TABS: 4.0000 mg | ORAL_TABLET | Freq: Four times a day (QID) | ORAL | Status: DC | PRN

## 2017-12-19 MED ORDER — ENSURE ENLIVE PO LIQD: 237.0000 mL | Freq: Two times a day (BID) | ORAL | Status: DC

## 2017-12-19 MED ORDER — MAGNESIUM OXIDE 400 (241.3 MG) MG PO TABS
400.0000 mg | ORAL_TABLET | Freq: Every day | ORAL | Status: DC
  Administered 2017-12-20: 10:00:00 400 mg via ORAL
  Filled 2017-12-19: qty 1

## 2017-12-19 MED ORDER — SODIUM CHLORIDE 0.9 % IV SOLN
1.0000 g | INTRAVENOUS | Status: DC
  Administered 2017-12-19: 1 g via INTRAVENOUS
  Filled 2017-12-19: qty 1
  Filled 2017-12-19: qty 10

## 2017-12-19 MED ORDER — APIXABAN 2.5 MG PO TABS
2.5000 mg | ORAL_TABLET | Freq: Two times a day (BID) | ORAL | Status: DC
  Administered 2017-12-19 – 2017-12-20 (×2): 2.5 mg via ORAL
  Filled 2017-12-19 (×2): qty 1

## 2017-12-19 MED ORDER — HYDROCODONE-ACETAMINOPHEN 5-325 MG PO TABS
1.0000 | ORAL_TABLET | ORAL | Status: DC | PRN
  Administered 2017-12-19 – 2017-12-20 (×3): 1 via ORAL
  Filled 2017-12-19 (×3): qty 1

## 2017-12-19 MED ORDER — ONDANSETRON HCL 4 MG/2ML IJ SOLN: 4.0000 mg | Freq: Four times a day (QID) | INTRAMUSCULAR | Status: DC | PRN

## 2017-12-19 MED ORDER — SODIUM CHLORIDE 0.9 % IV BOLUS
250.0000 mL | Freq: Once | INTRAVENOUS | Status: AC
  Administered 2017-12-19: 250 mL via INTRAVENOUS

## 2017-12-19 MED ORDER — MEDICAL COMPRESSION THIGH HIGH MISC: 1.0000 "application " | Freq: Every day | Status: DC

## 2017-12-19 MED ORDER — ALLOPURINOL 300 MG PO TABS
300.0000 mg | ORAL_TABLET | Freq: Every day | ORAL | Status: DC
  Administered 2017-12-20: 10:00:00 300 mg via ORAL
  Filled 2017-12-19: qty 1

## 2017-12-19 NOTE — Progress Notes (Addendum)
Family Meeting Note  Advance Directive:yes  Today a meeting took place with the Patient SON AT bed side     The following clinical team members were present during this meeting:MD  The following were discussed:Patient's diagnosis: Diarrhea, failure to thrive, new decubitus ulcers, right toe ulcers, hyperkalemia, treatment plan of care discussed in detail with the patient and son at bedside.  Also patient's other comorbidities as documented below are also discussed   Anemia    . Anxiety   . Arthritis   . Cardiomyopathy ([place])    a. 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  Marland Kitchen Chronic combined systolic (congestive) and diastolic (congestive) heart failure ([place])    a. 05/2015 TEE: EF 35-40% (in setting of Afib).  . Chronic Pelvic pain   . Coronary artery disease    a. 05/2015 CTA Chest: 3 vessel coronary Ca2+.  . Depression   . Diabetes mellitus without complication ([place])   . Emphysema lung ([place])   . Gastric ulcer   . Gout   . Hyperlipidemia   . Hypertension   . Nephrolithiasis   . PAF (paroxysmal atrial fibrillation) ([place])    a. 05/2015 s/p DCCV; b. 03/2016 s/p DCCV; c. CHA2DS2VASc = 6 (coumadin).  . Prostate cancer [facility])    Prostate  . Stage III chronic kidney disease ([place])   . Weight loss    50 lb since Apr 24, 2015     patient's progosis: Unable to determine and Goals for treatment: DNR ,son si HCPOA  Additional follow-up to be provided: Hospitalist  Time spent during discussion:17MIN  [name], MD

## 2017-12-19 NOTE — Plan of Care (Signed)

## 2017-12-19 NOTE — ED Notes (Signed)
Pt provided with soda and sandwich tray per Dr. Burlene Arnt.

## 2017-12-19 NOTE — ED Triage Notes (Signed)
First nurse:  Brought by ems from son's home.  Pain from cyst on flank and groin.  There for months, but causing pain today.  Vss, fsbs 126, 161/72, p54, sat95RA.

## 2017-12-19 NOTE — ED Notes (Signed)
Attempted to call report. No nurse to take report at this time. Charge, rn aware.

## 2017-12-19 NOTE — ED Triage Notes (Signed)
Pt to ED reporting back pain with an abscess on the lower back and glute area. PT reports the abscess has been draining intermittently. PT reports diarrhea for the past 4 days. No new antibiotics and reports having been discharged from the hospital 4 weeks ago for an unknown dx. PT also reports he has  A hernia that has been hurting him recently near his prostate. Pt able to urinate normally and denies pain or blood in urine.   Generalized abd pain reported as well. No tenderness noted. No vomiting or nausea.

## 2017-12-19 NOTE — H&P (Signed)
Gregory Tyler at [place] NAME: Gregory Tyler    MR#:  [id number]  DATE OF BIRTH:  [birth year]  DATE OF ADMISSION:  12/19/2017  PRIMARY CARE PHYSICIAN: [name], MD   REQUESTING/REFERRING PHYSICIAN:    CHIEF COMPLAINT:  diarrhea  HISTORY OF PRESENT ILLNESS:  Gregory Tyler Comes  is a 77 y.o. male with a known history of cardiomyopathy with ejection fraction 35 to 40%, chronic pelvic pain, chronic renal insufficiency stage III and multiple other medical problems was discharged from [facility] 2 weeks ago and has been staying with his son for the past 2 weeks is presenting to the ED with a chief complaint of 2-day history of diarrhea and patient was feeling terribly weak and unable to get out of the bed to take care of him.  Unfortunately patient's on work 12 hours a day and unable to take care of his dad while he is at work.  Patient has leukocytosis but no fever.  New sacral decubitus ulcers were noticed and also 2 ulcers was noticed on the right toe.  Hospitalist team is called to admit the patient.  PAST MEDICAL HISTORY:   Past Medical History:  Diagnosis Date  . Anemia   . Anxiety   . Arthritis   . Cardiomyopathy ([place])    a. 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  Marland Kitchen Chronic combined systolic (congestive) and diastolic (congestive) heart failure ([place])    a. 05/2015 TEE: EF 35-40% (in setting of Afib).  . Chronic Pelvic pain   . Coronary artery disease    a. 05/2015 CTA Chest: 3 vessel coronary Ca2+.  . Depression   . Diabetes mellitus without complication ([place])   . Emphysema lung ([place])   . Gastric ulcer   . Gout   . Hyperlipidemia   . Hypertension   . Nephrolithiasis   . PAF (paroxysmal atrial fibrillation) ([place])    a. 05/2015 s/p DCCV; b. 03/2016 s/p DCCV; c. CHA2DS2VASc = 6 (coumadin).  . Prostate cancer [facility])    Prostate  . Stage III chronic kidney disease ([place])   . Weight loss    50 lb since  Apr 24, 2015    PAST SURGICAL HISTOIRY:   Past Surgical History:  Procedure Laterality Date  . APPENDECTOMY    . BACK SURGERY    . CHOLECYSTECTOMY    . ELECTROPHYSIOLOGIC STUDY N/A 06/17/2015   Procedure: CARDIOVERSION;  Surgeon: Wellington Hampshire, MD;  Location: [facility] ORS;  Service: Cardiovascular;  Laterality: N/A;  . ELECTROPHYSIOLOGIC STUDY N/A 03/30/2016   Procedure: CARDIOVERSION;  Surgeon: Minna Merritts, MD;  Location: [facility] ORS;  Service: Cardiovascular;  Laterality: N/A;  . GASTRIC BYPASS    . HEMORRHOID SURGERY    . pilonidal cyst    . TEE WITHOUT CARDIOVERSION N/A 06/17/2015   Procedure: TRANSESOPHAGEAL ECHOCARDIOGRAM (TEE);  Surgeon: Wellington Hampshire, MD;  Location: [facility] ORS;  Service: Cardiovascular;  Laterality: N/A;    SOCIAL HISTORY:   Social History   Tobacco Use  . Smoking status: Former Research scientist (life sciences)  . Smokeless tobacco: Current User    Types: Chew  Substance Use Topics  . Alcohol use: No    FAMILY HISTORY:   Family History  Problem Relation Age of Onset  . CAD Mother   . Stroke Mother   . COPD Mother   . Heart failure Mother   . Diabetes Other     DRUG ALLERGIES:  Allergies  Allergen Reactions  . Aspirin     GI Bleeding  . Codeine Other (See Comments)    BLISTERS Other reaction(s): Other (See Comments) BLISTERS   . Codeine Sulfate Other (See Comments)    Other reaction(s): Other (See Comments) BLISTERS BLISTERS    REVIEW OF SYSTEMS:  CONSTITUTIONAL: No fever, fatigue.  Reporting generalized weakness.  EYES: No blurred or double vision.  EARS, NOSE, AND THROAT: No tinnitus or ear pain.  RESPIRATORY: No cough, shortness of breath, wheezing or hemoptysis.  CARDIOVASCULAR: No chest pain, orthopnea, edema.  GASTROINTESTINAL: No nausea, vomiting, patient is reporting diarrhea, no abdominal pain.  GENITOURINARY: No dysuria, hematuria.  ENDOCRINE: No polyuria, nocturia,  HEMATOLOGY: No anemia, easy bruising or bleeding SKIN: No rash or  lesion. MUSCULOSKELETAL: No joint pain or arthritis.   NEUROLOGIC: No tingling, numbness, patient is reporting generalized weakness.  PSYCHIATRY: No anxiety or depression.   MEDICATIONS AT HOME:   Prior to Admission medications   Medication Sig Start Date End Date Taking? Authorizing Provider  acetaminophen (TYLENOL) 325 MG tablet Take 2 tablets (650 mg total) by mouth every 6 (six) hours as needed for mild pain (or Fever >/= 101). 09/15/17  Yes Wieting, Richard, MD  allopurinol (ZYLOPRIM) 300 MG tablet Take 300 mg by mouth daily.   Yes [provider]  ELIQUIS 2.5 MG TABS tablet Take 2.5 mg by mouth every 12 (twelve) hours. 12/12/17  Yes [provider]  furosemide (LASIX) 20 MG tablet Take 40mg  in the morning and 20 mg in the afternoon Patient taking differently: Take 20 mg by mouth daily as needed for edema.  10/03/17  Yes Minna Merritts, MD  gabapentin (NEURONTIN) 100 MG capsule Take 1 capsule (100 mg total) by mouth at bedtime. 09/15/17  Yes Wieting, Richard, MD  HYDROcodone-acetaminophen (NORCO/VICODIN) 5-325 MG tablet Take 1 tablet by mouth every 4 to 6 hours as needed for pain   Yes [provider]  magnesium oxide (MAG-OX) 400 (241.3 Mg) MG tablet Take 400 mg by mouth daily.   Yes [provider]  metFORMIN (GLUCOPHAGE) 500 MG tablet Take 500 mg by mouth 2 (two) times daily with a meal.    Yes [provider]  metoprolol tartrate (LOPRESSOR) 50 MG tablet Take 50 mg by mouth 2 (two) times daily.   Yes [provider]  rosuvastatin (CRESTOR) 20 MG tablet Take 20 mg by mouth daily.   Yes [provider]  albuterol (PROVENTIL HFA;VENTOLIN HFA) 108 (90 Base) MCG/ACT inhaler Inhale 2 puffs into the lungs every 6 (six) hours as needed for wheezing or shortness of breath. Patient not taking: Reported on 12/19/2017 08/08/15   Minna Merritts, MD  amiodarone (PACERONE) 200 MG tablet TAKE 1 TABLET BY MOUTH ONCE DAILY Patient not  taking: Reported on 12/19/2017 09/23/17   Minna Merritts, MD  ammonium lactate (LAC-HYDRIN) 12 % lotion Apply twice a day to feet Patient not taking: Reported on 12/19/2017 09/15/17   Loletha Grayer, MD  atorvastatin (LIPITOR) 20 MG tablet TAKE 1 TABLET BY MOUTH ONCE DAILY Patient not taking: Reported on 12/19/2017 10/02/17   Minna Merritts, MD  Elastic Bandages & Supports (MEDICAL COMPRESSION THIGH HIGH) MISC 1 application by Does not apply route daily. 09/11/17   Merlyn Lot, MD  feeding supplement, ENSURE ENLIVE, (ENSURE ENLIVE) LIQD Take 237 mLs by mouth 2 (two) times daily between meals. 09/15/17   Loletha Grayer, MD  lisinopril (PRINIVIL,ZESTRIL) 5 MG tablet Take 1 tablet (5 mg  total) by mouth daily. Patient not taking: Reported on 12/19/2017 10/03/17   Minna Merritts, MD  metoprolol succinate (TOPROL-XL) 25 MG 24 hr tablet Take 0.5 tablets (12.5 mg total) by mouth daily. Patient not taking: Reported on 12/19/2017 10/03/17   Minna Merritts, MD  warfarin (COUMADIN) 4 MG tablet Take 1 tablet (4 mg total) by mouth daily. Patient not taking: Reported on 12/19/2017 09/15/17 09/15/18  Loletha Grayer, MD      VITAL SIGNS:  Blood pressure 124/79, pulse 79, temperature 98 F (36.7 C), temperature source Oral, resp. rate 16, height 5\' 8"  (1.727 m), weight 68.8 kg, SpO2 100 %.  PHYSICAL EXAMINATION:  GENERAL:  77 y.o.-year-old patient lying in the bed with no acute distress.  EYES: Pupils equal, round, reactive to light and accommodation. No scleral icterus. Extraocular muscles intact.  HEENT: Head atraumatic, normocephalic. Oropharynx and nasopharynx clear.  NECK:  Supple, no jugular venous distention. No thyroid enlargement, no tenderness.  LUNGS: Normal breath sounds bilaterally, no wheezing, rales,rhonchi or crepitation. No use of accessory muscles of respiration.  CARDIOVASCULAR: S1, S2 normal. No murmurs, rubs, or gallops.  ABDOMEN: Soft, nontender, nondistended. Bowel sounds  present. No organomegaly or mass.  EXTREMITIES: No pedal edema, cyanosis, or clubbing.  NEUROLOGIC: Globally weak sensation intact. Gait not checked.  PSYCHIATRIC: The patient is alert and oriented x 3.  SKIN: Sacral ligament is ulcers on the right great toe with 2 ulcers with surrounding cellulitis  LABORATORY PANEL:   CBC Recent Labs  Lab 12/19/17 1217  WBC 14.4*  HGB 10.7*  HCT 32.5*  PLT 314   ------------------------------------------------------------------------------------------------------------------  Chemistries  Recent Labs  Lab 12/19/17 1217  NA 142  K 5.4*  CL 112*  CO2 19*  GLUCOSE 116*  BUN 42*  CREATININE 1.53*  CALCIUM 8.6*  AST 24  ALT 17  ALKPHOS 98  BILITOT 0.8   ------------------------------------------------------------------------------------------------------------------  Cardiac Enzymes No results for input(s): TROPONINI in the last 168 hours. ------------------------------------------------------------------------------------------------------------------  RADIOLOGY:  No results found.  EKG:   Orders placed or performed during the hospital encounter of 12/19/17  . ED EKG  . ED EKG  . EKG 12-Lead  . EKG 12-Lead  . EKG 12-Lead  . EKG 12-Lead  . EKG 12-Lead  . EKG 12-Lead  . EKG 12-Lead  . EKG 12-Lead    IMPRESSION AND PLAN:   Jake Fuhrmann  is a 77 y.o. male with a known history of cardiomyopathy with ejection fraction 35 to 40%, chronic pelvic pain, chronic renal insufficiency stage III and multiple other medical problems was discharged from [facility] 2 weeks ago and has been staying with his son for the past 2 weeks is presenting to the ED with a chief complaint of 2-day history of diarrhea and patient was feeling terribly weak and unable to get out of the bed to take care of him.  Unfortunately patient's on work 12 hours a day and unable to take care of his dad while he is at work.  Patient has leukocytosis but no  fever.  #Diarrhea-etiology unclear at this time Admit to MedSurg floor Check stool for occult blood, C. difficile toxin and GI panel We will check lactic acid and procalcitonin  #Right foot ulcer Start the patient on IV Rocephin and vancomycin Wound care consulted  #Hyperkalemia [place] [employer] ACE inhibitor  #New sacral decubitus ulcers Reposition patient every 1-2 hours Wound care  #Failure to thrive Gentle hydration with IV fluids, PT evaluation  #Diabetes mellitus with diabetic  neuropathy Hold metformin provide sliding scale insulin and continue gabapentin    All the records are reviewed and case discussed with ED provider. Management plans discussed with the patient, family and they are in agreement.  CODE STATUS: dnr , son healthcare power of attorney  TOTAL TIME TAKING CARE OF THIS PATIENT: 43  minutes.   Note: This dictation was prepared with Dragon dictation along with smaller phrase technology. Any transcriptional errors that result from this process are unintentional.  [name] M.D on 12/19/2017 at 4:30 PM  Between 7am to 6pm - Pager - [phone]  After 6pm go to [email or web address] - password [email or web address] [facility]  Office  [phone]  CC: Primary care physician; [name], MD

## 2017-12-19 NOTE — ED Notes (Signed)
Pt placed into contact precautions.

## 2017-12-19 NOTE — ED Provider Notes (Addendum)
[facility] Emergency Department Provider Note  ____________________________________________   I have reviewed the triage vital signs and the nursing notes. Where available I have reviewed prior notes and, if possible and indicated, outside hospital notes.    HISTORY  Chief Complaint Back Pain and Diarrhea    HPI Gregory Tyler is a 77 y.o. male  Cardiomyopathy, anemia, anxiety, arthritis, EF of 35 to 40% last seen, chronic pelvic pain, chronic renal insufficiency, discharged from [facility], has been home for about 10 days, during that time he has had difficulty.  He cannot be at his own house, because he is unable to get up and get down on his own, he is therefore living with his son.  His son works 12 hours a day and the patient states he spends almost time this time in bed.  He is been having diarrhea over the last couple days and having bowel movements in the actual bed.  Patient states that he has had no fever.  He has chronic pelvic pain which is not changed from years.  He has chronic back pain.  He is complaining about these things.  He states that he is able to eat and drink.  He is having decubitus ulcers start on his bottom.  He was trying to get placed as an outpatient but cannot arrange it.  Here for the diarrhea and the bedsores and feeling generally weak.  Also for pain control for his chronic back pain have a recent MRI.  No numbness or weakness     Past Medical History:  Diagnosis Date  . Anemia   . Anxiety   . Arthritis   . Cardiomyopathy ([place])    a. 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  Marland Kitchen Chronic combined systolic (congestive) and diastolic (congestive) heart failure ([place])    a. 05/2015 TEE: EF 35-40% (in setting of Afib).  . Chronic Pelvic pain   . Coronary artery disease    a. 05/2015 CTA Chest: 3 vessel coronary Ca2+.  . Depression   . Diabetes mellitus without complication ([place])   . Emphysema lung ([place])    . Gastric ulcer   . Gout   . Hyperlipidemia   . Hypertension   . Nephrolithiasis   . PAF (paroxysmal atrial fibrillation) ([place])    a. 05/2015 s/p DCCV; b. 03/2016 s/p DCCV; c. CHA2DS2VASc = 6 (coumadin).  . Prostate cancer [facility])    Prostate  . Stage III chronic kidney disease ([place])   . Weight loss    50 lb since Apr 24, 2015    Patient Active Problem List   Diagnosis Date Noted  . Pressure injury of skin 11/16/2017  . Enteritis 11/15/2017  . Malnutrition of moderate degree 09/13/2017  . Elevated INR 09/02/2017  . Paroxysmal atrial fibrillation ([place]) 10/25/2016  . Chronic fatigue   . Encounter for anticoagulation discussion and counseling 09/21/2015  . Hyperlipidemia 08/08/2015  . Diabetes ([place]) 05/19/2015  . Hyperkalemia   . Dehydration   . Orthostatic hypotension 05/09/2015  . Congestive dilated cardiomyopathy ([place]) 05/09/2015  . Polypharmacy 04/27/2015  . Chronic diastolic CHF (congestive heart failure) ([place]) 04/25/2015  . SOB (shortness of breath)   . Coronary artery disease involving native coronary artery of native heart without angina pectoris   . Essential hypertension     Past Surgical History:  Procedure Laterality Date  . APPENDECTOMY    . BACK SURGERY    . CHOLECYSTECTOMY    .  ELECTROPHYSIOLOGIC STUDY N/A 06/17/2015   Procedure: CARDIOVERSION;  Surgeon: Wellington Hampshire, MD;  Location: [facility] ORS;  Service: Cardiovascular;  Laterality: N/A;  . ELECTROPHYSIOLOGIC STUDY N/A 03/30/2016   Procedure: CARDIOVERSION;  Surgeon: Minna Merritts, MD;  Location: [facility] ORS;  Service: Cardiovascular;  Laterality: N/A;  . GASTRIC BYPASS    . HEMORRHOID SURGERY    . pilonidal cyst    . TEE WITHOUT CARDIOVERSION N/A 06/17/2015   Procedure: TRANSESOPHAGEAL ECHOCARDIOGRAM (TEE);  Surgeon: Wellington Hampshire, MD;  Location: [facility] ORS;  Service: Cardiovascular;  Laterality: N/A;    Prior to Admission medications   Medication Sig Start Date End Date Taking? Authorizing Provider   acetaminophen (TYLENOL) 325 MG tablet Take 2 tablets (650 mg total) by mouth every 6 (six) hours as needed for mild pain (or Fever >/= 101). 09/15/17   Loletha Grayer, MD  albuterol (PROVENTIL HFA;VENTOLIN HFA) 108 (90 Base) MCG/ACT inhaler Inhale 2 puffs into the lungs every 6 (six) hours as needed for wheezing or shortness of breath. 08/08/15   Minna Merritts, MD  amiodarone (PACERONE) 200 MG tablet TAKE 1 TABLET BY MOUTH ONCE DAILY 09/23/17   Minna Merritts, MD  ammonium lactate (LAC-HYDRIN) 12 % lotion Apply twice a day to feet 09/15/17   Loletha Grayer, MD  atorvastatin (LIPITOR) 20 MG tablet TAKE 1 TABLET BY MOUTH ONCE DAILY 10/02/17   Minna Merritts, MD  Elastic Bandages & Supports (MEDICAL COMPRESSION THIGH HIGH) MISC 1 application by Does not apply route daily. 09/11/17   Merlyn Lot, MD  feeding supplement, ENSURE ENLIVE, (ENSURE ENLIVE) LIQD Take 237 mLs by mouth 2 (two) times daily between meals. 09/15/17   Loletha Grayer, MD  furosemide (LASIX) 20 MG tablet Take 40mg  in the morning and 20 mg in the afternoon 10/03/17   Minna Merritts, MD  gabapentin (NEURONTIN) 100 MG capsule Take 1 capsule (100 mg total) by mouth at bedtime. 09/15/17   Loletha Grayer, MD  lisinopril (PRINIVIL,ZESTRIL) 5 MG tablet Take 1 tablet (5 mg total) by mouth daily. 10/03/17   Minna Merritts, MD  metFORMIN (GLUCOPHAGE) 500 MG tablet Take 500 mg by mouth 2 (two) times daily with a meal.     [provider]  metoprolol succinate (TOPROL-XL) 25 MG 24 hr tablet Take 0.5 tablets (12.5 mg total) by mouth daily. 10/03/17   Minna Merritts, MD  sodium chloride (OCEAN) 0.65 % SOLN nasal spray Place 1 spray into both nostrils as needed for congestion.    [provider]  SYMBICORT 160-4.5 MCG/ACT inhaler Inhale 2 puffs into the lungs 2 (two) times daily.  12/21/15   [provider]  warfarin (COUMADIN) 4 MG tablet Take 1 tablet (4 mg total) by mouth daily. Patient taking  differently: Take 2.5 mg by mouth daily.  09/15/17 09/15/18  Loletha Grayer, MD    Allergies Aspirin; Codeine; and Codeine sulfate  Family History  Problem Relation Age of Onset  . CAD Mother   . Stroke Mother   . COPD Mother   . Heart failure Mother   . Diabetes Other     Social History Social History   Tobacco Use  . Smoking status: Former Research scientist (life sciences)  . Smokeless tobacco: Current User    Types: Chew  Substance Use Topics  . Alcohol use: No  . Drug use: No    Review of Systems Constitutional: No fever/chills Eyes: No visual changes. ENT: No sore throat. No stiff neck no neck pain Cardiovascular: Denies  chest pain. Respiratory: Denies shortness of breath. Gastrointestinal:   no vomiting.  + diarrhea.  No constipation. Genitourinary: Negative for dysuria. Musculoskeletal: + lower extremity swelling Skin: + for rash. Neurological: Negative for severe headaches, focal weakness or numbness.   ____________________________________________   PHYSICAL EXAM:  VITAL SIGNS: ED Triage Vitals  Enc Vitals Group     BP 12/19/17 1214 109/60     Pulse Rate 12/19/17 1214 79     Resp 12/19/17 1214 16     Temp 12/19/17 1214 98 F (36.7 C)     Temp Source 12/19/17 1214 Oral     SpO2 12/19/17 1214 100 %     Weight 12/19/17 1215 151 lb 10.8 oz (68.8 kg)     Height 12/19/17 1215 5\' 8"  (1.727 m)     Head Circumference --      Peak Flow --      Pain Score 12/19/17 1215 10     Pain Loc --      Pain Edu? --      Excl. in [place]? --     Constitutional: Alert and oriented.  Cachectic elderly gentleman anxious Eyes: Conjunctivae are normal Head: Atraumatic HEENT: No congestion/rhinnorhea. Mucous membranes are moist.  Oropharynx non-erythematous Neck:   Nontender with no meningismus, no masses, no stridor Cardiovascular: Normal rate, regular rhythm. Grossly normal heart sounds.  Good peripheral circulation. Respiratory: Normal respiratory effort.  No retractions. Lungs  CTAB. Abdominal: Soft and nontender. No distention. No guarding no rebound Back: Diffuse lumbar tenderness noted, no obvious focality.  there is no midline tenderness there are no lesions noted. there is no CVA tenderness Musculoskeletal: No lower extremity tenderness, no upper extremity tenderness. No joint effusions, 2-3+ bilateral pitting edema noted Neurologic:  Normal speech and language. No gross focal neurologic deficits are appreciated.  Skin:  Skin is warm, dry and intact.  Decubitus ulcers noted on bilateral buttocks Psychiatric: Mood and affect are normal. Speech and behavior are normal.  ____________________________________________   LABS (all labs ordered are listed, but only abnormal results are displayed)  Labs Reviewed  COMPREHENSIVE METABOLIC PANEL - Abnormal; Notable for the following components:      Result Value   Potassium 5.4 (*)    Chloride 112 (*)    CO2 19 (*)    Glucose, Bld 116 (*)    BUN 42 (*)    Creatinine, Ser 1.53 (*)    Calcium 8.6 (*)    Total Protein 6.1 (*)    Albumin 2.7 (*)    GFR calc non Af Amer 42 (*)    GFR calc Af Amer 49 (*)    All other components within normal limits  CBC - Abnormal; Notable for the following components:   WBC 14.4 (*)    RBC 3.49 (*)    Hemoglobin 10.7 (*)    HCT 32.5 (*)    RDW 18.8 (*)    All other components within normal limits  URINALYSIS, COMPLETE (UACMP) WITH MICROSCOPIC - Abnormal; Notable for the following components:   Color, Urine YELLOW (*)    APPearance CLEAR (*)    Hgb urine dipstick SMALL (*)    Bacteria, UA RARE (*)    All other components within normal limits  LIPASE, BLOOD    Pertinent labs  results that were available during my care of the patient were reviewed by me and considered in my medical decision making (see chart for details). ____________________________________________  EKG  I personally interpreted any EKGs ordered by  me or triage LBBB rate 77 baseline wander noted, no  acute ischemic changes.  Unclear rhythm.  Likely sinus  EKG performed because of baseline wander shows sinus rhythm rate 74, old left bundle branch block noted ____________________________________________  RADIOLOGY  Pertinent labs & imaging results that were available during my care of the patient were reviewed by me and considered in my medical decision making (see chart for details). If possible, patient and/or family made aware of any abnormal findings.  No results found. ____________________________________________    PROCEDURES  Procedure(s) performed: None  Procedures  Critical Care performed: None  ____________________________________________   INITIAL IMPRESSION / ASSESSMENT AND PLAN / ED COURSE  Pertinent labs & imaging results that were available during my care of the patient were reviewed by me and considered in my medical decision making (see chart for details).  Patient here with diarrhea, has not had diarrhea in a few hours, also with new decubitus ulcers, elevated potassium and inability to get around the house.  He does already have home nursing and other people to come help him but in the meantime he is bedbound and his family is forced to leave him by himself in the house.  He is clearly not able to do well in this environment.  His potassium is somewhat elevated, but he has a very low EF.  I think gentle hydration over time would probably the most appropriate response we will get an EKG to evaluate for EKG changes.  Have chronic back pain, however, his MRI was recently reassuring.  ----------------------------------------- 3:34 PM on 12/19/2017 -----------------------------------------      ____________________________________________   FINAL CLINICAL IMPRESSION(S) / ED DIAGNOSES  Final diagnoses:  None      This chart was dictated using voice recognition software.  Despite best efforts to proofread,  errors can occur which can change meaning.       Schuyler Amor, MD 12/19/17 1500    Schuyler Amor, MD 12/19/17 1536    Schuyler Amor, MD 12/19/17 [phone]

## 2017-12-19 NOTE — ED Notes (Signed)
[place], RN given report.

## 2017-12-20 DIAGNOSIS — E11621 Type 2 diabetes mellitus with foot ulcer: Secondary | ICD-10-CM | POA: Diagnosis not present

## 2017-12-20 DIAGNOSIS — R197 Diarrhea, unspecified: Secondary | ICD-10-CM | POA: Diagnosis not present

## 2017-12-20 LAB — CBC
HCT: 25.6 % — ABNORMAL LOW (ref 40.0–52.0)
HEMOGLOBIN: 8.6 g/dL — AB (ref 13.0–18.0)
MCH: 31.4 pg (ref 26.0–34.0)
MCHC: 33.7 g/dL (ref 32.0–36.0)
MCV: 93.2 fL (ref 80.0–100.0)
PLATELETS: 225 10*3/uL (ref 150–440)
RBC: 2.74 MIL/uL — AB (ref 4.40–5.90)
RDW: 17.5 % — ABNORMAL HIGH (ref 11.5–14.5)
WBC: 9.8 10*3/uL (ref 3.8–10.6)

## 2017-12-20 LAB — COMPREHENSIVE METABOLIC PANEL
ALK PHOS: 77 U/L (ref 38–126)
ALT: 14 U/L (ref 0–44)
ANION GAP: 4 — AB (ref 5–15)
AST: 18 U/L (ref 15–41)
Albumin: 2 g/dL — ABNORMAL LOW (ref 3.5–5.0)
BILIRUBIN TOTAL: 0.6 mg/dL (ref 0.3–1.2)
BUN: 37 mg/dL — ABNORMAL HIGH (ref 8–23)
CALCIUM: 7.9 mg/dL — AB (ref 8.9–10.3)
CO2: 22 mmol/L (ref 22–32)
CREATININE: 1.3 mg/dL — AB (ref 0.61–1.24)
Chloride: 116 mmol/L — ABNORMAL HIGH (ref 98–111)
GFR, EST AFRICAN AMERICAN: 59 mL/min — AB (ref >60)
GFR, EST NON AFRICAN AMERICAN: 51 mL/min — AB (ref >60)
Glucose, Bld: 135 mg/dL — ABNORMAL HIGH (ref 70–99)
Potassium: 4.5 mmol/L (ref 3.5–5.1)
SODIUM: 142 mmol/L (ref 135–145)
TOTAL PROTEIN: 4.6 g/dL — AB (ref 6.5–8.1)

## 2017-12-20 MED ORDER — HYDROCODONE-ACETAMINOPHEN 5-325 MG PO TABS: 1.0000 | ORAL_TABLET | Freq: Four times a day (QID) | ORAL | 0 refills | Status: AC | PRN

## 2017-12-20 MED ORDER — AMOXICILLIN-POT CLAVULANATE 875-125 MG PO TABS: 1.0000 | ORAL_TABLET | Freq: Two times a day (BID) | ORAL | 0 refills | Status: AC

## 2017-12-20 NOTE — Discharge Summary (Addendum)
[facility] [place] [facility], 77 y.o., DOB 1940-05-12, MRN [id number]. 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 male with a known history of cardiomyopathy with ejection fraction 35 to 40%, chronic pelvic pain, chronic renal insufficiency stage III and multiple other medical problems was discharged from [facility] 2 weeks ago and has been staying with his son for the past 2 weeks is presenting to the ED with a chief complaint of 2-day history of diarrhea and patient was feeling terribly weak and unable to get out of the bed to take care of him.  Due to this he was brought to the emergency room.  Patient was admitted for further evaluation his diarrhea since being admitted has resolved.  He was seen by wound care consult.  And they recommended wound care.  Patient needs to be placed in a skilled nursing facility.       augmentin x 5 days for possible wound infection  Wound care: sacrum   Wound care to sacral Stage 3 pressure injury and left ischial tuberosity Unstageable pressure injury:  Cleanse with NS, pat gently dry.  Cover both wounds with single ,saline dampened gauze 2x2 (opened), top with dry gauze and cover with silicone foam dressing. Change saline gauze twice daily. May reuse silicone foam and change twice weekly and PRN soiling or rolling of dressing edges. Turn patient side to side, minimizing supine positioning except for meals. Keep HOB at or  below a 30-degree angle.  Wound care other areas:  Wound care to bilateral foot wounds on the RGT, right foot, plantar aspect, 1st metatarsal head, right heel, left lateral foot and medial heel:  Cleanse with NS, pat gently dry.  Cover with xeroform gauze, top with dry dressing and secure with a few turns of Kerlix roll gauze.  Place feet into Prevalon boots.   Consults  None  Significant Tests:  See full reports for all details     Mr Cervical Spine W Wo Contrast  Result Date: 12/06/2017 CLINICAL DATA:  Left-sided weakness for 3 months with left hand numbness. EXAM: MRI CERVICAL SPINE WITHOUT AND WITH CONTRAST TECHNIQUE: Multiplanar and multiecho pulse sequences of the cervical spine, to include the craniocervical junction and cervicothoracic junction, were obtained without and with intravenous contrast. CONTRAST:  31mL MULTIHANCE GADOBENATE DIMEGLUMINE 529 MG/ML IV SOLN COMPARISON:  None. FINDINGS: Alignment: Normal Vertebrae: Mild right C3-4 facet edema. No focal marrow lesion. No discitis-osteomyelitis. Cord: Normal Posterior Fossa, vertebral arteries, paraspinal tissues: Visualized posterior fossa is normal. Vertebral artery flow voids are preserved. No prevertebral soft tissue swelling. Disc levels: The C1-C2 level shows moderate hypertrophy. Foramen magnum is widely patent. C2-C3. Small disc protrusion narrows the ventral thecal sac without causing spinal canal stenosis. There is moderate narrowing of the left neural foramen. C3-C4: Bilateral uncovertebral and facet hypertrophy. Mild central spinal canal stenosis. Mild bilateral foraminal stenosis. C4-C5: Intermediate sized central disc protrusion narrows the ventral thecal sac and indents the spinal cord. Moderate central spinal canal stenosis. Mild left foraminal stenosis. C5-C6: No  disc herniation. Mild facet hypertrophy. No spinal canal stenosis. Mild left foraminal narrowing. C6-C7: Large central disc protrusion effaces the ventral thecal  sac and indents the spinal cord. Moderate central spinal canal stenosis. Mild bilateral foraminal stenosis. C7-T1: Normal. T1-T2: Imaged only in the sagittal plane.  Normal. There is no abnormal contrast enhancement. IMPRESSION: 1. C6-7 large central disc protrusion indenting the spinal cord and causing moderate spinal canal stenosis. 2. C4-5 intermediate central disc protrusion indenting the spinal cord and causing moderate spinal canal stenosis. 3. C2-C3 moderate left foraminal stenosis. 4. Mild foraminal stenosis at multiple levels. Electronically Signed   By: Ulyses Jarred M.D.   On: 12/06/2017 16:36   Mr Lumbar Spine W Wo Contrast  Result Date: 12/06/2017 CLINICAL DATA:  Chronic low back pain with right leg pain for 1 week. Left-sided weakness for 3 months. EXAM: MRI LUMBAR SPINE WITHOUT AND WITH CONTRAST TECHNIQUE: Multiplanar and multiecho pulse sequences of the lumbar spine were obtained without and with intravenous contrast. CONTRAST:  29mL MULTIHANCE GADOBENATE DIMEGLUMINE 529 MG/ML IV SOLN COMPARISON:  CT abdomen pelvis 11/15/2017 FINDINGS: Segmentation: Normal. The lowest disc space is considered to be L5-S1. Alignment:  Normal Vertebrae: There is a prominent Schmorl's node at the superior L1 endplate with mild edema. Configuration is unchanged from 11/15/2017. No other marrow signal abnormality. Postsurgical changes at L4-L5. Conus medullaris and cauda equina: The conus medullaris terminates at the L1 level. The cauda equina and conus medullaris are both normal. Paraspinal and other soft tissues: The visualized aorta, IVC and iliac vessels are normal. The visualized retroperitoneal organs and paraspinal soft tissues are normal. Disc levels: T11-T12: Imaged only in the sagittal plane. No stenosis or disc herniation. T12-L1: Normal disc space and facets.  No stenosis. L1-L2: Normal disc hydration. Large right-sided osteophyte encroaches on the exiting right L1 nerve root in the extraforaminal zone. No  spinal canal stenosis. L2-L3: Intermediate disc bulge with moderate facet hypertrophy. Mild spinal canal narrowing. No neural foraminal stenosis. L3-L4: Moderate facet hypertrophy and medium-sized disc bulge. No spinal canal stenosis. Mild right and moderate left neural foraminal stenosis. L4-L5: Severe disc space narrowing. Prior posterior decompression without residual spinal canal stenosis. Moderate bilateral neural foraminal stenosis. L5-S1: Moderate facet hypertrophy with left eccentric disc bulge. There is narrowing of the lateral recesses, left worse than right. No central spinal canal stenosis. Severe bilateral neural foraminal stenosis. IMPRESSION: 1. L5-S1 severe bilateral neural foraminal stenosis and left worse than right lateral recess stenosis. These findings could contribute to radiculopathy in the L5 or S1 distributions. 2. Remote L4-5 posterior decompression without residual spinal canal stenosis. Moderate bilateral neural foraminal stenosis. 3. Large right-sided L1-2 osteophyte encroaches on the exiting right L1 nerve root. Correlate for associated radiculopathy. 4. Mild right, moderate left L3-4 neural foraminal stenosis. Electronically Signed   By: Ulyses Jarred M.D.   On: 12/06/2017 16:22       Today   Subjective:   Gregory Tyler patient denying any complaint Objective:   Blood pressure 104/62, pulse 62, temperature 98.3 F (36.8 C), temperature source Oral, resp. rate 16, height 5\' 8"  (1.727 m), weight 68.8 kg, SpO2 98 %.  .  Intake/Output Summary (Last 24 hours) at 12/20/2017 1249 Last data filed at 12/20/2017 0600 Gross per 24 hour  Intake 1210 ml  Output 600 ml  Net 610 ml    Exam VITAL SIGNS: Blood pressure 104/62, pulse 62, temperature 98.3 F (36.8 C), temperature source Oral, resp. rate 16, height 5\' 8"  (1.727 m), weight  68.8 kg, SpO2 98 %.  GENERAL:  77 y.o.-year-old patient lying in the bed with no acute distress.  EYES: Pupils equal, round, reactive to  light and accommodation. No scleral icterus. Extraocular muscles intact.  HEENT: Head atraumatic, normocephalic. Oropharynx and nasopharynx clear.  NECK:  Supple, no jugular venous distention. No thyroid enlargement, no tenderness.  LUNGS: Normal breath sounds bilaterally, no wheezing, rales,rhonchi or crepitation. No use of accessory muscles of respiration.  CARDIOVASCULAR: S1, S2 normal. No murmurs, rubs, or gallops.  ABDOMEN: Soft, nontender, nondistended. Bowel sounds present. No organomegaly or mass.  EXTREMITIES: No pedal edema, cyanosis, or clubbing.  NEUROLOGIC: Cranial nerves II through XII are intact. Muscle strength 5/5 in all extremities. Sensation intact. Gait not checked.  PSYCHIATRIC: The patient is alert and oriented x 3.  SKIN: Multiple wounds legs sacrum  Data Review     CBC w Diff:  Lab Results  Component Value Date   WBC 9.8 12/20/2017   HGB 8.6 (L) 12/20/2017   HGB 12.2 (L) 03/27/2016   HCT 25.6 (L) 12/20/2017   HCT 37.6 03/27/2016   PLT 225 12/20/2017   PLT 243 03/27/2016   LYMPHOPCT 19 09/11/2017   MONOPCT 8 09/11/2017   EOSPCT 1 09/11/2017   BASOPCT 0 09/11/2017   CMP:  Lab Results  Component Value Date   NA 142 12/20/2017   NA 139 03/27/2016   K 4.5 12/20/2017   CL 116 (H) 12/20/2017   CO2 22 12/20/2017   BUN 37 (H) 12/20/2017   BUN 29 (H) 03/27/2016   CREATININE 1.30 (H) 12/20/2017   PROT 4.6 (L) 12/20/2017   ALBUMIN 2.0 (L) 12/20/2017   BILITOT 0.6 12/20/2017   ALKPHOS 77 12/20/2017   AST 18 12/20/2017   ALT 14 12/20/2017  .  Micro Results No results found for this or any previous visit (from the past 240 hour(s)).      Code Status Orders  (From admission, onward)         Start     Ordered   12/19/17 1933  Do not attempt resuscitation (DNR)  Continuous    Question Answer Comment  In the event of cardiac or respiratory ARREST Do not call a "code blue"   In the event of cardiac or respiratory ARREST Do not perform Intubation,  CPR, defibrillation or ACLS   In the event of cardiac or respiratory ARREST Use medication by any route, position, wound care, and other measures to relive pain and suffering. May use oxygen, suction and manual treatment of airway obstruction as needed for comfort.   Comments RN may pronounce      12/19/17 1932        Code Status History    Date Active Date Inactive Code Status Order ID Comments User Context   11/17/2017 1357 11/19/2017 1603 DNR [id number]  Hermelinda Dellen, DO Inpatient   11/15/2017 1550 11/17/2017 1357 Full Code [id number]  Loletha Grayer, MD ED   09/11/2017 1951 09/15/2017 1603 Full Code [id number]  Bettey Costa, MD ED   05/11/2015 1307 05/13/2015 1256 Full Code [id number]  Aldean Jewett, MD Inpatient   04/25/2015 0149 04/27/2015 1741 Full Code [id number]  Hillary Bow, MD ED            Discharge Medications   Allergies as of 12/20/2017      Reactions   Aspirin    GI Bleeding   Codeine Other (See Comments)   BLISTERS Other reaction(s): Other (See Comments) BLISTERS   Codeine Sulfate Other (  See Comments)   Other reaction(s): Other (See Comments) BLISTERS BLISTERS      Medication List    STOP taking these medications   albuterol 108 (90 Base) MCG/ACT inhaler Commonly known as:  PROVENTIL HFA;VENTOLIN HFA   amiodarone 200 MG tablet Commonly known as:  PACERONE   ammonium lactate 12 % lotion Commonly known as:  LAC-HYDRIN   atorvastatin 20 MG tablet Commonly known as:  LIPITOR   metoprolol succinate 25 MG 24 hr tablet Commonly known as:  TOPROL-XL   warfarin 4 MG tablet Commonly known as:  COUMADIN     TAKE these medications   acetaminophen 325 MG tablet Commonly known as:  TYLENOL Take 2 tablets (650 mg total) by mouth every 6 (six) hours as needed for mild pain (or Fever >/= 101).   allopurinol 300 MG tablet Commonly known as:  ZYLOPRIM Take 300 mg by mouth daily.   amoxicillin-clavulanate 875-125 MG tablet Commonly known as:   AUGMENTIN Take 1 tablet by mouth 2 (two) times daily for 5 days.   ELIQUIS 2.5 MG Tabs tablet Generic drug:  apixaban Take 2.5 mg by mouth every 12 (twelve) hours.   feeding supplement (ENSURE ENLIVE) Liqd Take 237 mLs by mouth 2 (two) times daily between meals.   furosemide 20 MG tablet Commonly known as:  LASIX Take 40mg  in the morning and 20 mg in the afternoon What changed:    how much to take  how to take this  when to take this  reasons to take this  additional instructions   gabapentin 100 MG capsule Commonly known as:  NEURONTIN Take 1 capsule (100 mg total) by mouth at bedtime.   HYDROcodone-acetaminophen 5-325 MG tablet Commonly known as:  NORCO/VICODIN Take 1 tablet by mouth every 6 (six) hours as needed for up to 5 days for moderate pain. Take 1 tablet by mouth every 4 to 6 hours as needed for pain What changed:    how much to take  how to take this  when to take this  reasons to take this   lisinopril 5 MG tablet Commonly known as:  PRINIVIL,ZESTRIL Take 1 tablet (5 mg total) by mouth daily.   magnesium oxide 400 (241.3 Mg) MG tablet Commonly known as:  MAG-OX Take 400 mg by mouth daily.   Medical Compression Thigh High Misc 1 application by Does not apply route daily.   metFORMIN 500 MG tablet Commonly known as:  GLUCOPHAGE Take 500 mg by mouth 2 (two) times daily with a meal.   metoprolol tartrate 50 MG tablet Commonly known as:  LOPRESSOR Take 50 mg by mouth 2 (two) times daily.   rosuvastatin 20 MG tablet Commonly known as:  CRESTOR Take 20 mg by mouth daily.          Total Time in preparing paper work, data evaluation and todays exam - 28 minutes  Dustin Flock M.D on 12/20/2017 at 12:49 PM [place]  [phone]

## 2017-12-20 NOTE — Consult Note (Signed)
[facility] Nurse wound consult note Reason for Consult: Pressure injuries, trauma (toes)   Wound type:Pressure, trauma Pressure Injury POA: Yes Measurement/Description: Bilateral feet:  Deep tissue pressure injury (DTPI) to left medial heel (2.4cm x 3.2cm), Unstageable pressure injury to right heel (2cm x 3cm).  Full thickness wound to tip of RGT (2cm x 2.6cm x 0.2cm), vascular infarct (discoloration) to plantar aspect 1st metatarsal head (1cm x 1.5cm), left lateral foot 3cm x 1.5cm x 0.2cm) Sacrum: Stage 3 PrI with evidence of previous wound healing (contraction and scarring in the periwound area) measures 2cm x 2.8cm x 0.2cm, pink, moist with odor associated with urine Left ischial tuberosity:  2cm x 1.5cm area with eschar (unable to assess depth, but full thickness) Drainage (amount, consistency, odor) scant serous drainage from all wounds, odor consistent with urine at sacral and left IT, with poor hygiene at feet. Periwound: Intact, macerated at groin and buttock. Overall skin dry with poor turgor. Dressing procedure/placement/frequency: Mattress replacement with low air loss feature for pressure redistribution and microclimate management due to incontinence. Bilateral pressure redistribution heel boots for elevation and protection. Pressure redistribution chair cushion for use when OOB in chair. Skin care guidance including turning and repositioning, head of bed elevation and hygiene provided via the Orders. Bilateral feet:  Cleanse with NS, pat dry.  Cover Deep Tissue Pressure Injury (DTPI) to left heel, Unstageable PrI to right heel and vascular/traumatic ulcers with xeroform gauze, top with dry dressings and secure with a few turns of Kerlix roll gauze/paper tape.  Place feet into Prevalon pressure redistribution heel boots. Sacrum and left ischial tuberosity wounds:  Cleanse with NS, cover with saline moistened gauze, top with dry gauze and cover with silicone foam. Change saline moistened gauze  twice daily, form twice weekly and PRN.   [place] nursing team will not follow, but will remain available to this patient, the nursing and medical teams.  Please re-consult if needed. Thanks, Maudie Flakes, MSN, RN, [place], Ar[facility] Abbott  Pager# [phone]

## 2017-12-20 NOTE — Evaluation (Signed)
Physical Therapy Evaluation Patient Details Name: Gregory Tyler MRN: [id number] DOB: April 20, 1941 Today's Date: 12/20/2017   History of Present Illness  77 year old male who was here ~1 month ago, spent a few weeks at rehab and then returned home but has had a very hard time with selfcare, mobility, etc. Admitted now for weakness and diarrhea.  Pt also has pressure ulcers in both feet L>R as well as sacral pressure sores.  PMH includes stage III CKD, prostate CA, PAF, gout, cardiomyopathy and chronic pelvic pain.  Clinical Impression  Pt is very weak and limited with PT exam and ultimately needed max assist to get to sitting and was unable to get to standing even with max assist.  He showed good effort and willingness to participate but was clearly very frustrated with his situation (tearful on multiple occasions t/o the session).  Pt reports he has been able to do some limited walking with PT and son but this is getting harder and less safe for him.  Pt has pressure sores in b/l heels/feet as well as sacrum.  PT floated his heels and positioned him with pillow under L side to help alleviated pressure.  Pt in a lot of pain with all tasks and was ultimately very limited. He is not safe to return home and will need rehab once medically ready for discharge.      Follow Up Recommendations SNF    Equipment Recommendations  None recommended by PT    Recommendations for Other Services       Precautions / Restrictions Precautions Precautions: Fall Restrictions Weight Bearing Restrictions: No      Mobility  Bed Mobility Overal bed mobility: Needs Assistance Bed Mobility: Sit to Supine;Supine to Sit     Supine to sit: Max assist Sit to supine: Total assist   General bed mobility comments: Pt c/o back pain specifically and soreness all over generally with getting to sitting.  Initially unable to maintain sitting balance despite b/l UEs, showed good effort but very limited in  sitting  Transfers Overall transfer level: Needs assistance Equipment used: Rolling walker (2 wheeled) Transfers: Sit to/from Stand Sit to Stand: Total assist         General transfer comment: Attempted to get to standing X 2 with very limited success.  He showed good effort and knew to get feet back and use UEs to shift forward but ultimately was far too weak to assist even getting bottom off bed w/o max assist from PT  Ambulation/Gait                Stairs            Wheelchair Mobility    Modified Rankin (Stroke Patients Only)       Balance Overall balance assessment: Needs assistance Sitting-balance support: Bilateral upper extremity supported Sitting balance-Leahy Scale: Poor Sitting balance - Comments: Pt initially leaning back heavily, with PT assist he was able to position more appropriately and did maintain sitting balance with UEs but was not confident or safe with the effort.      Standing balance-Leahy Scale: Zero                               Pertinent Vitals/Pain Pain Assessment: 0-10 Pain Score: 8  Pain Location: pain all over, mostly in L foot but b/l heels, shoulders, sacrum    Home Living Family/patient expects to be discharged to:: Skilled  nursing facility Living Arrangements: Children               Additional Comments: Pt apparently has been having a very hard time with any mobility around the home needing sons to essentially pick him up, etc    Prior Function Level of Independence: Needs assistance   Gait / Transfers Assistance Needed: Uses RW for ambulation but states that he has not been able to walk as much lately.  ADL's / Homemaking Assistance Needed: Pt is reliant on children to assist with most needs        Hand Dominance        Extremity/Trunk Assessment   Upper Extremity Assessment Upper Extremity Assessment: Generalized weakness(Pt unable to elevate R shoulder >40 deg AROM, poor AROM b/ll)     Lower Extremity Assessment Lower Extremity Assessment: Generalized weakness(Pain with most movement in LEs, grossly 2/5 with poor QofM)       Communication   Communication: HOH  Cognition Arousal/Alertness: Awake/alert Behavior During Therapy: Anxious(Pt at times tearful about his situation) Overall Cognitive Status: Within Functional Limits for tasks assessed                                        General Comments      Exercises     Assessment/Plan    PT Assessment Patient needs continued PT services  PT Problem List Decreased strength;Decreased range of motion;Decreased activity tolerance;Decreased balance;Decreased coordination;Decreased mobility;Decreased knowledge of use of DME;Decreased safety awareness;Decreased knowledge of precautions;Pain       PT Treatment Interventions DME instruction;Gait training;Functional mobility training;Therapeutic activities;Therapeutic exercise;Balance training;Neuromuscular re-education;Patient/family education    PT Goals (Current goals can be found in the Care Plan section)  Acute Rehab PT Goals Patient Stated Goal: Pt wanting to go to rehab and be with his wife PT Goal Formulation: With patient Time For Goal Achievement: 01/03/18 Potential to Achieve Goals: Fair    Frequency Min 2X/week   Barriers to discharge        Co-evaluation               AM-PAC PT "6 Clicks" Daily Activity  Outcome Measure Difficulty turning over in bed (including adjusting bedclothes, sheets and blankets)?: Unable Difficulty moving from lying on back to sitting on the side of the bed? : Unable Difficulty sitting down on and standing up from a chair with arms (e.g., wheelchair, bedside commode, etc,.)?: Unable Help needed moving to and from a bed to chair (including a wheelchair)?: Total Help needed walking in hospital room?: Total Help needed climbing 3-5 steps with a railing? : Total 6 Click Score: 6    End of Session  Equipment Utilized During Treatment: Gait belt Activity Tolerance: Patient limited by pain;Patient limited by fatigue Patient left: with chair alarm set;with call bell/phone within reach Nurse Communication: Mobility status(request for catheter and positioning for pressure ulcers) PT Visit Diagnosis: Muscle weakness (generalized) (M62.81);Difficulty in walking, not elsewhere classified (R26.2);Pain Pain - Right/Left: Left Pain - part of body: Ankle and joints of foot    Time: 1191-4782 PT Time Calculation (min) (ACUTE ONLY): 58 min   Charges:   PT Evaluation $PT Eval Low Complexity: 1 Low PT Treatments $Therapeutic Activity: 8-22 mins        Kreg Shropshire, DPT 12/20/2017, 12:25 PM

## 2017-12-20 NOTE — Plan of Care (Signed)

## 2017-12-20 NOTE — NC FL2 (Signed)
[place] LEVEL OF CARE SCREENING TOOL     IDENTIFICATION  Patient Name: Gregory Tyler Birthdate: Aug 23, 1940 Sex: male Admission Date (Current Location): 12/19/2017  [place] and [place] Number:  Engineering geologist and Address:  [facility], [address] [place], [place] [address]      Provider Number: [id number]  Attending Physician Name and Address:  Dustin Flock, MD  Relative Name and Phone Number:       Current Level of Care: Hospital Recommended Level of Care: [place] Prior Approval Number:    Date Approved/Denied:   PASRR Number: [id number])  Discharge Plan: SNF    Current Diagnoses: Patient Active Problem List   Diagnosis Date Noted  . Diarrhea 12/19/2017  . Pressure injury of skin 11/16/2017  . Enteritis 11/15/2017  . Malnutrition of moderate degree 09/13/2017  . Elevated INR 09/02/2017  . Paroxysmal atrial fibrillation ([place]) 10/25/2016  . Chronic fatigue   . Encounter for anticoagulation discussion and counseling 09/21/2015  . Hyperlipidemia 08/08/2015  . Diabetes ([place]) 05/19/2015  . Hyperkalemia   . Dehydration   . Orthostatic hypotension 05/09/2015  . Congestive dilated cardiomyopathy ([place]) 05/09/2015  . Polypharmacy 04/27/2015  . Chronic diastolic CHF (congestive heart failure) ([place]) 04/25/2015  . SOB (shortness of breath)   . Coronary artery disease involving native coronary artery of native heart without angina pectoris   . Essential hypertension     Orientation RESPIRATION BLADDER Height & Weight     Self, Time, Situation, Place  Normal Continent Weight: 151 lb 10.8 oz (68.8 kg) Height:  5\' 8"  (172.7 cm)  BEHAVIORAL SYMPTOMS/MOOD NEUROLOGICAL BOWEL NUTRITION STATUS      Continent Diet(Diet: Heart Healthy/ Carb Modified. )  AMBULATORY STATUS COMMUNICATION OF NEEDS Skin   Extensive Assist Verbally PU Stage and Appropriate Care(Bilateral feet:  Deep tissue pressure injury  (DTPI) to left medial heel (2.4cm x 3.2cm), Unstageable pressure injury to right heel (2cm x 3cm).  Full thickness wound to tip of RGT (2cm x 2.6cm x 0.2cm), )                       Personal Care Assistance Level of Assistance  Bathing, Feeding, Dressing Bathing Assistance: Limited assistance Feeding assistance: Independent Dressing Assistance: Limited assistance     Functional Limitations Info  Sight, Hearing, Speech Sight Info: Adequate Hearing Info: Impaired Speech Info: Adequate    SPECIAL CARE FACTORS FREQUENCY  PT (By licensed PT), OT (By licensed OT)(vascular infarct (discoloration) to plantar aspect 1st metatarsal head (1cm x 1.5cm), left lateral foot 3cm x 1.5cm x 0.2cm) Sacrum: Stage 3 PrI with evidence of previous wound healing (contraction and scarring in the periwound area) measures 2cm x 2.8cm)     PT Frequency: (5) OT Frequency: (5)            Contractures      Additional Factors Info  Code Status, Allergies, Isolation Precautions Code Status Info: (DNR ) Allergies Info: (Aspirin, Codeine, Codeine Sulfate)     Isolation Precautions Info: (Enteric precautions. )     Current Medications (12/20/2017):  This is the current hospital active medication list Current Facility-Administered Medications  Medication Dose Route Frequency Provider Last Rate Last Dose  . 0.9 %  sodium chloride infusion   Intravenous Continuous Gouru, Aruna, MD 50 mL/hr at 12/20/17 0600    . acetaminophen (TYLENOL) tablet 650 mg  650 mg Oral Q6H PRN [name], MD      .  allopurinol (ZYLOPRIM) tablet 300 mg  300 mg Oral Daily Gouru, Aruna, MD   300 mg at 12/20/17 0943  . apixaban (ELIQUIS) tablet 2.5 mg  2.5 mg Oral Q12H Gouru, Aruna, MD   2.5 mg at 12/20/17 0942  . cefTRIAXone (ROCEPHIN) 1 g in sodium chloride 0.9 % 100 mL IVPB  1 g Intravenous Q24H Gouru, Aruna, MD 200 mL/hr at 12/19/17 2306 1 g at 12/19/17 2306  . feeding supplement (ENSURE ENLIVE) (ENSURE ENLIVE) liquid 237 mL   237 mL Oral BID BM Gouru, Aruna, MD      . gabapentin (NEURONTIN) capsule 100 mg  100 mg Oral QHS Gouru, Aruna, MD   100 mg at 12/19/17 2206  . HYDROcodone-acetaminophen (NORCO/VICODIN) 5-325 MG per tablet 1 tablet  1 tablet Oral Q4H PRN [name], MD   1 tablet at 12/20/17 0943  . magnesium oxide (MAG-OX) tablet 400 mg  400 mg Oral Daily Gouru, Aruna, MD   400 mg at 12/20/17 0942  . metoprolol tartrate (LOPRESSOR) tablet 50 mg  50 mg Oral BID Gouru, Aruna, MD   50 mg at 12/19/17 2206  . ondansetron (ZOFRAN) tablet 4 mg  4 mg Oral Q6H PRN Gouru, Aruna, MD       Or  . ondansetron (ZOFRAN) injection 4 mg  4 mg Intravenous Q6H PRN Gouru, Aruna, MD      . patiromer Daryll Drown) packet 8.4 g  8.4 g Oral Daily Gouru, Aruna, MD   8.4 g at 12/19/17 2304  . rosuvastatin (CRESTOR) tablet 20 mg  20 mg Oral Daily Gouru, Aruna, MD   20 mg at 12/20/17 6629     Discharge Medications: Please see discharge summary for a list of discharge medications.  Relevant Imaging Results:  Relevant Lab Results:   Additional Information (SSN: [id number])  Sael Furches, Veronia Beets, LCSW

## 2017-12-20 NOTE — Progress Notes (Signed)
Patient is being discharged [facility] [employer], report called to Weston Brass, RN. All the questions answered and concern addressed. Waiting on EMS for transport.

## 2017-12-20 NOTE — Clinical Social Work Note (Signed)
Clinical Social Work Assessment  Patient Details  Name: Gregory Tyler MRN: [id number] Date of Birth: March 25, 1941  Date of referral:  12/20/17               Reason for consult:  Facility Placement                Permission sought to share information with:  Gregory Tyler granted to share information::  Yes, Verbal Permission Granted  Name::      Gregory Tyler::     Relationship::     Contact Information:     Housing/Transportation Living arrangements for the past 2 months:  Gregory Tyler, Gregory Tyler of Information:  Patient, Adult Children Patient Interpreter Needed:  None Criminal Activity/Legal Involvement Pertinent to Current Situation/Hospitalization:  No - Comment as needed Significant Relationships:  Adult Children Lives with:  Spouse Do you feel safe going back to the place where you live?  Yes Need for family participation in patient care:  Yes (Comment)  Care giving concerns:  Patient lives in [place] and just discharged from [facility] 2 weeks ago.    Social Worker assessment / plan:  Holiday representative (CSW) received SNF consult. PT is recommending SNF. Per [facility] admissions coordinator at [facility] patient's wife is currently at [facility] and they will accept patient back today. Per Gregory Tyler patient does not require a 3 night inpatient hospital stay because he has not been out of [facility] past 30 days. CSW met with patient and his son Gregory Tyler [phone] was at bedside. Patient and his son Gregory Tyler are agreeable for patient to return to [facility]. Per patient his wife is at [facility] and he wants to go back there.   Patient is medically stable for D/C to [facility] today. Per Gregory Tyler patient can come today to room 207-B. RN will call report and arrange EMS for transport. CSW sent D/C orders to [facility] via [facility]. Patient is aware of above. Patient's son Gregory Tyler is  aware of above. Please reconsult if future social work needs arise. CSW signing off.    Employment status:  Disabled (Comment on whether or not currently receiving Disability), Retired Forensic scientist:  Medicare PT Recommendations:  [place]free / Referral to community resources:  Gregory Tyler  Patient/Family's Response to care:  Patient is agreeable to D/C to [facility] today.   Patient/Family's Understanding of and Emotional Response to Diagnosis, Current Treatment, and Prognosis:  Patient and his son were very pleasant and thanked CSW for assistance.   Emotional Assessment Appearance:  Appears stated age Attitude/Demeanor/Rapport:    Affect (typically observed):  Accepting, Adaptable, Pleasant Orientation:  Oriented to Self, Oriented to Place, Oriented to  Time, Oriented to Situation Alcohol / Substance use:  Not Applicable Psych involvement (Current and /or in the community):  No (Comment)  Discharge Needs  Concerns to be addressed:  Discharge Planning Concerns Readmission within the last 30 days:  No Current discharge risk:  Dependent with Mobility Barriers to Discharge:  Continued Medical Work up   [employer], Gregory Beets, LCSW 12/20/2017, 2:57 PM

## 2017-12-20 NOTE — Progress Notes (Signed)
Patient wounds changed, clean dry intact and Prevalon boots placed on both feet. Patient tolerated well.

## 2017-12-20 NOTE — Clinical Social Work Placement (Signed)
   CLINICAL SOCIAL WORK PLACEMENT  NOTE  Date:  12/20/2017  Patient Details  Name: [name] MRN: [id number] Date of Birth: [birth year]  Clinical Social Work is seeking post-discharge placement for this patient at the [place] level of care (*CSW will initial, date and re-position this form in  chart as items are completed):  Yes   Patient/family provided with [place] Work Department's list of facilities offering this level of care within the geographic area requested by the patient (or if unable, by the patient's family).  Yes   Patient/family informed of their freedom to choose among providers that offer the needed level of care, that participate in Medicare, Medicaid or managed care program needed by the patient, have an available bed and are willing to accept the patient.  Yes   Patient/family informed of [facility]'s ownership interest in [facility] and [facility], as well as of the fact that they are under no obligation to receive care at these facilities.  PASRR submitted to EDS on       PASRR number received on       Existing PASRR number confirmed on 12/20/17     FL2 transmitted to all facilities in geographic area requested by pt/family on 12/20/17     FL2 transmitted to all facilities within larger geographic area on       Patient informed that his/her managed care company has contracts with or will negotiate with certain facilities, including the following:        Yes   Patient/family informed of bed offers received.  Patient chooses bed at [facility] )     Physician recommends and patient chooses bed at      Patient to be transferred to [facility] ) on 12/20/17.  Patient to be transferred to facility by [facility] )     Patient family notified on 12/20/17 of transfer.  Name of family member notified:  (Patient's son [name] is aware of D/C today. )     PHYSICIAN       Additional  Comment:    _______________________________________________ Dajanay Northrup, Veronia Beets, LCSW 12/20/2017, 2:19 PM

## 2017-12-20 NOTE — Plan of Care (Signed)
  Problem: Education: Goal: Knowledge of General Education information will improve Description Including pain rating scale, medication(s)/side effects and non-pharmacologic comfort measures 12/20/2017 0010 by Athena Masse, RN Outcome: Progressing 12/19/2017 2002 by Athena Masse, RN Outcome: Progressing   Problem: Health Behavior/Discharge Planning: Goal: Ability to manage health-related needs will improve 12/20/2017 0010 by Athena Masse, RN Outcome: Progressing 12/19/2017 2002 by Athena Masse, RN Outcome: Progressing   Problem: Clinical Measurements: Goal: Ability to maintain clinical measurements within normal limits will improve 12/20/2017 0010 by Athena Masse, RN Outcome: Progressing 12/19/2017 2002 by Athena Masse, RN Outcome: Progressing Goal: Will remain free from infection 12/20/2017 0010 by Athena Masse, RN Outcome: Progressing 12/19/2017 2002 by Athena Masse, RN Outcome: Progressing Goal: Diagnostic test results will improve 12/20/2017 0010 by Athena Masse, RN Outcome: Progressing 12/19/2017 2002 by Athena Masse, RN Outcome: Progressing Goal: Respiratory complications will improve 12/20/2017 0010 by Athena Masse, RN Outcome: Progressing 12/19/2017 2002 by Athena Masse, RN Outcome: Progressing Goal: Cardiovascular complication will be avoided 12/20/2017 0010 by Athena Masse, RN Outcome: Progressing 12/19/2017 2002 by Athena Masse, RN Outcome: Progressing   Problem: Activity: Goal: Risk for activity intolerance will decrease 12/20/2017 0010 by Athena Masse, RN Outcome: Progressing 12/19/2017 2002 by Athena Masse, RN Outcome: Progressing   Problem: Nutrition: Goal: Adequate nutrition will be maintained 12/20/2017 0010 by Athena Masse, RN Outcome: Progressing 12/19/2017 2002 by Athena Masse, RN Outcome: Progressing   Problem: Coping: Goal: Level of anxiety will  decrease 12/20/2017 0010 by Athena Masse, RN Outcome: Progressing 12/19/2017 2002 by Athena Masse, RN Outcome: Progressing   Problem: Elimination: Goal: Will not experience complications related to bowel motility 12/20/2017 0010 by Athena Masse, RN Outcome: Progressing 12/19/2017 2002 by Athena Masse, RN Outcome: Progressing Goal: Will not experience complications related to urinary retention 12/20/2017 0010 by Athena Masse, RN Outcome: Progressing 12/19/2017 2002 by Athena Masse, RN Outcome: Progressing   Problem: Pain Managment: Goal: General experience of comfort will improve 12/20/2017 0010 by Athena Masse, RN Outcome: Progressing 12/19/2017 2002 by Athena Masse, RN Outcome: Progressing   Problem: Safety: Goal: Ability to remain free from injury will improve 12/20/2017 0010 by Athena Masse, RN Outcome: Progressing 12/19/2017 2002 by Athena Masse, RN Outcome: Progressing   Problem: Skin Integrity: Goal: Risk for impaired skin integrity will decrease 12/20/2017 0010 by Athena Masse, RN Outcome: Progressing 12/19/2017 2002 by Athena Masse, RN Outcome: Progressing

## 2017-12-20 NOTE — Progress Notes (Signed)
Patient transported by EMS to [facility].

## 2018-01-08 ENCOUNTER — Encounter (INDEPENDENT_AMBULATORY_CARE_PROVIDER_SITE_OTHER): Payer: Self-pay

## 2018-01-08 ENCOUNTER — Ambulatory Visit (INDEPENDENT_AMBULATORY_CARE_PROVIDER_SITE_OTHER): Payer: Medicare Other | Admitting: Nurse Practitioner

## 2018-01-08 VITALS — BP 72/43 | HR 62 | Resp 16 | Ht 68.0 in | Wt 149.0 lb

## 2018-01-08 DIAGNOSIS — E7800 Pure hypercholesterolemia, unspecified: Secondary | ICD-10-CM | POA: Diagnosis not present

## 2018-01-08 DIAGNOSIS — I70234 Atherosclerosis of native arteries of right leg with ulceration of heel and midfoot: Secondary | ICD-10-CM

## 2018-01-08 DIAGNOSIS — I480 Paroxysmal atrial fibrillation: Secondary | ICD-10-CM

## 2018-01-08 DIAGNOSIS — I10 Essential (primary) hypertension: Secondary | ICD-10-CM

## 2018-01-08 DIAGNOSIS — I70244 Atherosclerosis of native arteries of left leg with ulceration of heel and midfoot: Secondary | ICD-10-CM

## 2018-01-08 NOTE — Progress Notes (Signed)
Subjective:    Patient ID: Gregory Tyler, male    DOB: 1940/10/21, 77 y.o.   MRN: [id number] Chief Complaint  Patient presents with  . New Patient (Initial Visit)    PVD and Ulcers    HPI  Gregory Tyler is a 77 y.o. male referred by Dr.Fowler with painful lower extremities and diminished pulses associated with ulceration of the foot.  The patient notes the ulcers have been present for about four-five months and has not been improving.  It is very painful and has had some drainage.  No specific history of trauma noted by the patient.  The patient denies fever or chills.    Patient notes prior to the ulcer developing the extremities were painful particularly with ambulation or activity and the discomfort is very consistent day today. Typically, the pain occurs at less than one block, progress is as activity continues to the point that the patient must stop walking. Resting including standing still for several minutes allowed resumption of the activity and the ability to walk a similar distance before stopping again. Uneven terrain and inclined shorten the distance. The pain has been progressive over the past several years.   The patient denies rest pain or dangling of an extremity off the side of the bed during the night for relief. No prior interventions or surgeries.  No history of back problems or DJD of the lumbar sacral spine. But there is a sacral ulcer present as well.  The patient denies amaurosis fugax or recent TIA symptoms. There are no recent neurological changes noted. The patient denies history of DVT, PE or superficial thrombophlebitis. The patient denies recent episodes of angina or shortness of breath.   Review of Systems: Negative Unless Checked Constitutional: [] Weight loss  [] Fever  [] Chills Cardiac: [] Chest pain   [] Chest pressure   [] Palpitations   [] Shortness of breath when laying flat   [] Shortness of breath with exertion. Vascular:  [x] Pain in legs with  walking   [] Pain in legs with standing  [] History of DVT   [] Phlebitis   [] Swelling in legs   [] Varicose veins   [x] Non-healing ulcers Pulmonary:   [] Uses home oxygen   [] Productive cough   [] Hemoptysis   [] Wheeze  [] COPD   [] Asthma Neurologic:  [] Dizziness   [] Seizures   [] History of stroke   [] History of TIA  [] Aphasia   [] Vissual changes   [] Weakness or numbness in arm   [x] Weakness or numbness in leg Musculoskeletal:   [] Joint swelling   [] Joint pain   [] Low back pain Hematologic:  [] Easy bruising  [] Easy bleeding   [] Hypercoagulable state   [] Anemic Gastrointestinal:  [] Diarrhea   [] Vomiting  [] Gastroesophageal reflux/heartburn   [] Difficulty swallowing. Genitourinary:  [] Chronic kidney disease   [] Difficult urination  [] Frequent urination   [] Blood in urine Skin:  [] Rashes   [] Ulcers  Psychological:  [] History of anxiety   [x]  History of major depression.     Objective:   Physical Exam  BP (!) 72/43 (BP Location: Right Arm, Patient Position: Sitting)   Pulse 62   Resp 16   Ht 5\' 8"  (1.727 m)   Wt 149 lb (67.6 kg)   BMI 22.66 kg/m   Past Medical History:  Diagnosis Date  . Anemia   . Anxiety   . Arthritis   . Cardiomyopathy ([place])    a. 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  Marland Kitchen Chronic combined systolic (congestive) and diastolic (congestive) heart failure (HCC)  a. 05/2015 TEE: EF 35-40% (in setting of Afib).  . Chronic Pelvic pain   . Coronary artery disease    a. 05/2015 CTA Chest: 3 vessel coronary Ca2+.  . Depression   . Diabetes mellitus without complication ([place])   . Emphysema lung ([place])   . Gastric ulcer   . Gout   . Hyperlipidemia   . Hypertension   . Nephrolithiasis   . PAF (paroxysmal atrial fibrillation) ([place])    a. 05/2015 s/p DCCV; b. 03/2016 s/p DCCV; c. CHA2DS2VASc = 6 (coumadin).  . Prostate cancer [facility])    Prostate  . Stage III chronic kidney disease ([place])   . Weight loss    50 lb since Apr 24, 2015     Gen: WD/WN, NAD,  cachetic appearance. Appears older than stated age. Head: [place]/AT, No temporalis wasting.  Ear/Nose/Throat: Hearing grossly intact, nares w/o erythema or drainage, poor dentition Eyes: PER, EOMI, sclera nonicteric.  Neck: Supple, no masses.  No JVD.  Pulmonary:  Good air movement, no use of accessory muscles.  Cardiac: RRR Vascular: Pale, hairless legs with ulcers on bilateral heels.  Right foot has an ulcer on great toe as well as several spots on smaller digits.   Vessel Right Left  PT Not palpable Not palpable  DP Not palpable Not palpable  Gastrointestinal: soft, non-distended. No guarding/no peritoneal signs.  Musculoskeletal: very weak, wheelchair bound  No deformity or atrophy.  Neurologic: Pain and light touch intact in extremities.  Symmetrical.  Speech is fluent. Motor exam as listed above. Psychiatric: Judgment intact, Mood & affect appropriate for pt's clinical situation. Dermatologic: No Venous rashes. No changes consistent with cellulitis. Lymph : No Cervical lymphadenopathy, no lichenification or skin changes of chronic lymphedema.   Social History   Socioeconomic History  . Marital status: Married    Spouse name: Not on file  . Number of children: Not on file  . Years of education: Not on file  . Highest education level: Not on file  Occupational History  . Occupation: retired  Scientific laboratory technician  . Financial resource strain: Not on file  . Food insecurity:    Worry: Patient refused    Inability: Patient refused  . Transportation needs:    Medical: Patient refused    Non-medical: Patient refused  Tobacco Use  . Smoking status: Former Smoker    Last attempt to quit: 12/19/1940    Years since quitting: 77.1  . Smokeless tobacco: Former Systems developer    Types: [place] date: 11/19/2014  Substance and Sexual Activity  . Alcohol use: No  . Drug use: No  . Sexual activity: Not Currently  Lifestyle  . Physical activity:    Days per week: 0 days    Minutes per session: 0  min  . Stress: Patient refused  Relationships  . Social connections:    Talks on phone: More than three times a week    Gets together: Twice a week    Attends religious service: Not on file    Active member of club or organization: No    Attends meetings of clubs or organizations: Never    Relationship status: Married  . Intimate partner violence:    Fear of current or ex partner: Patient refused    Emotionally abused: Patient refused    Physically abused: Patient refused    Forced sexual activity: Patient refused  Other Topics Concern  . Not on file  Social History Narrative   Retired.  Lives  iwith son    Past Surgical History:  Procedure Laterality Date  . APPENDECTOMY    . BACK SURGERY    . CHOLECYSTECTOMY    . ELECTROPHYSIOLOGIC STUDY N/A 06/17/2015   Procedure: CARDIOVERSION;  Surgeon: Wellington Hampshire, MD;  Location: [facility] ORS;  Service: Cardiovascular;  Laterality: N/A;  . ELECTROPHYSIOLOGIC STUDY N/A 03/30/2016   Procedure: CARDIOVERSION;  Surgeon: Minna Merritts, MD;  Location: [facility] ORS;  Service: Cardiovascular;  Laterality: N/A;  . GASTRIC BYPASS    . HEMORRHOID SURGERY    . pilonidal cyst    . TEE WITHOUT CARDIOVERSION N/A 06/17/2015   Procedure: TRANSESOPHAGEAL ECHOCARDIOGRAM (TEE);  Surgeon: Wellington Hampshire, MD;  Location: [facility] ORS;  Service: Cardiovascular;  Laterality: N/A;    Family History  Problem Relation Age of Onset  . CAD Mother   . Stroke Mother   . COPD Mother   . Heart failure Mother   . Diabetes Other     Allergies  Allergen Reactions  . Aspirin     GI Bleeding  . Codeine Other (See Comments)    BLISTERS Other reaction(s): Other (See Comments) BLISTERS   . Codeine Sulfate Other (See Comments)    Other reaction(s): Other (See Comments) BLISTERS BLISTERS       Assessment & Plan:   1. 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  This represents a limb threatening ischemia and places the patient at the risk for limb loss.  Patient should undergo angiography of the lower extremities with the hope for intervention for limb salvage.  The risks and benefits as well as the alternative therapies was discussed in detail with the patient.  All questions were answered.  Patient agrees to proceed with angiography.  We will plan to intervene on the right lower extremity first, followed by the left lower extremity.  The patient will follow up with me in the office after the procedure.    2. Essential hypertension Continue antihypertensive medications as already ordered, these medications have been reviewed and there are no changes at this time.   3. Pure hypercholesterolemia Continue statin as ordered and reviewed, no changes at this time   4. Paroxysmal atrial fibrillation (HCC) Continue antiarrhythmia medications as already ordered, these medications have been reviewed and there are no changes at this time.  Continue anticoagulation as ordered by Cardiology Service     Current Outpatient Medications on File Prior to Visit  Medication Sig Dispense Refill  . acetaminophen (TYLENOL) 325 MG tablet Take 2 tablets (650 mg total) by mouth every 6 (six) hours as needed for mild pain (or Fever >/= 101).    Marland Kitchen allopurinol (ZYLOPRIM) 300 MG tablet Take 300 mg by mouth daily.    Water engineer Bandages & Supports (MEDICAL COMPRESSION THIGH HIGH) MISC 1 application by Does not apply route daily. 4 each 0  . ELIQUIS 2.5 MG TABS tablet Take 2.5 mg by mouth every 12 (twelve) hours.  2  . feeding supplement, ENSURE ENLIVE, (ENSURE ENLIVE) LIQD Take 237 mLs by mouth 2 (two) times daily between meals. 60 Bottle 0  . furosemide (LASIX) 20 MG tablet Take 40mg  in the morning and 20 mg in the afternoon (Patient taking differently: Take  20 mg by mouth daily as needed for edema. )    . gabapentin (NEURONTIN)  100 MG capsule Take 1 capsule (100 mg total) by mouth at bedtime. 30 capsule 0  . magnesium oxide (MAG-OX) 400 (241.3 Mg) MG tablet Take 400 mg by mouth daily.    . metFORMIN (GLUCOPHAGE) 500 MG tablet Take 500 mg by mouth 2 (two) times daily with a meal.     . metoprolol tartrate (LOPRESSOR) 50 MG tablet Take 50 mg by mouth 2 (two) times daily.    . rosuvastatin (CRESTOR) 20 MG tablet Take 20 mg by mouth daily.    Marland Kitchen lisinopril (PRINIVIL,ZESTRIL) 5 MG tablet Take 1 tablet (5 mg total) by mouth daily. (Patient not taking: Reported on 12/19/2017) 90 tablet 4   No current facility-administered medications on file prior to visit.     There are no Patient Instructions on file for this visit. No follow-ups on file.   Kris Hartmann, NP

## 2018-01-09 ENCOUNTER — Encounter (INDEPENDENT_AMBULATORY_CARE_PROVIDER_SITE_OTHER): Payer: Self-pay | Admitting: Nurse Practitioner

## 2018-01-09 DIAGNOSIS — I70209 Unspecified atherosclerosis of native arteries of extremities, unspecified extremity: Secondary | ICD-10-CM | POA: Insufficient documentation

## 2018-01-09 DIAGNOSIS — L98499 Non-pressure chronic ulcer of skin of other sites with unspecified severity: Secondary | ICD-10-CM

## 2018-01-13 ENCOUNTER — Inpatient Hospital Stay: Payer: Medicare Other

## 2018-01-13 ENCOUNTER — Emergency Department: Payer: Medicare Other

## 2018-01-13 ENCOUNTER — Inpatient Hospital Stay
Admission: EM | Admit: 2018-01-13 | Discharge: 2018-01-30 | DRG: 853 | Disposition: A | Discharge status: Skilled Nursing Facility | Payer: Medicare Other | Attending: Internal Medicine | Admitting: Internal Medicine

## 2018-01-13 ENCOUNTER — Other Ambulatory Visit: Payer: Self-pay

## 2018-01-13 DIAGNOSIS — R7989 Other specified abnormal findings of blood chemistry: Secondary | ICD-10-CM

## 2018-01-13 DIAGNOSIS — I132 Hypertensive heart and chronic kidney disease with heart failure and with stage 5 chronic kidney disease, or end stage renal disease: Secondary | ICD-10-CM | POA: Diagnosis present

## 2018-01-13 DIAGNOSIS — Z9884 Bariatric surgery status: Secondary | ICD-10-CM

## 2018-01-13 DIAGNOSIS — A4152 Sepsis due to Pseudomonas: Secondary | ICD-10-CM | POA: Diagnosis present

## 2018-01-13 DIAGNOSIS — K7290 Hepatic failure, unspecified without coma: Secondary | ICD-10-CM

## 2018-01-13 DIAGNOSIS — Z66 Do not resuscitate: Secondary | ICD-10-CM

## 2018-01-13 DIAGNOSIS — Z8249 Family history of ischemic heart disease and other diseases of the circulatory system: Secondary | ICD-10-CM

## 2018-01-13 DIAGNOSIS — Z7901 Long term (current) use of anticoagulants: Secondary | ICD-10-CM | POA: Diagnosis not present

## 2018-01-13 DIAGNOSIS — R0602 Shortness of breath: Secondary | ICD-10-CM

## 2018-01-13 DIAGNOSIS — E785 Hyperlipidemia, unspecified: Secondary | ICD-10-CM | POA: Diagnosis present

## 2018-01-13 DIAGNOSIS — Z227 Latent tuberculosis: Secondary | ICD-10-CM

## 2018-01-13 DIAGNOSIS — Z87891 Personal history of nicotine dependence: Secondary | ICD-10-CM | POA: Diagnosis not present

## 2018-01-13 DIAGNOSIS — E1151 Type 2 diabetes mellitus with diabetic peripheral angiopathy without gangrene: Secondary | ICD-10-CM | POA: Diagnosis present

## 2018-01-13 DIAGNOSIS — J449 Chronic obstructive pulmonary disease, unspecified: Secondary | ICD-10-CM | POA: Diagnosis present

## 2018-01-13 DIAGNOSIS — K5900 Constipation, unspecified: Secondary | ICD-10-CM | POA: Diagnosis present

## 2018-01-13 DIAGNOSIS — R6521 Severe sepsis with septic shock: Secondary | ICD-10-CM | POA: Diagnosis present

## 2018-01-13 DIAGNOSIS — L97919 Non-pressure chronic ulcer of unspecified part of right lower leg with unspecified severity: Secondary | ICD-10-CM | POA: Diagnosis not present

## 2018-01-13 DIAGNOSIS — L97929 Non-pressure chronic ulcer of unspecified part of left lower leg with unspecified severity: Secondary | ICD-10-CM | POA: Diagnosis not present

## 2018-01-13 DIAGNOSIS — N179 Acute kidney failure, unspecified: Secondary | ICD-10-CM

## 2018-01-13 DIAGNOSIS — I5042 Chronic combined systolic (congestive) and diastolic (congestive) heart failure: Secondary | ICD-10-CM | POA: Diagnosis present

## 2018-01-13 DIAGNOSIS — Z992 Dependence on renal dialysis: Secondary | ICD-10-CM | POA: Diagnosis not present

## 2018-01-13 DIAGNOSIS — K760 Fatty (change of) liver, not elsewhere classified: Secondary | ICD-10-CM | POA: Diagnosis present

## 2018-01-13 DIAGNOSIS — Z79899 Other long term (current) drug therapy: Secondary | ICD-10-CM | POA: Diagnosis not present

## 2018-01-13 DIAGNOSIS — R531 Weakness: Secondary | ICD-10-CM | POA: Diagnosis not present

## 2018-01-13 DIAGNOSIS — I428 Other cardiomyopathies: Secondary | ICD-10-CM | POA: Diagnosis present

## 2018-01-13 DIAGNOSIS — I959 Hypotension, unspecified: Secondary | ICD-10-CM | POA: Diagnosis not present

## 2018-01-13 DIAGNOSIS — Z515 Encounter for palliative care: Secondary | ICD-10-CM

## 2018-01-13 DIAGNOSIS — Z8546 Personal history of malignant neoplasm of prostate: Secondary | ICD-10-CM

## 2018-01-13 DIAGNOSIS — L97909 Non-pressure chronic ulcer of unspecified part of unspecified lower leg with unspecified severity: Secondary | ICD-10-CM

## 2018-01-13 DIAGNOSIS — L97519 Non-pressure chronic ulcer of other part of right foot with unspecified severity: Secondary | ICD-10-CM | POA: Diagnosis not present

## 2018-01-13 DIAGNOSIS — Z7984 Long term (current) use of oral hypoglycemic drugs: Secondary | ICD-10-CM

## 2018-01-13 DIAGNOSIS — N189 Chronic kidney disease, unspecified: Secondary | ICD-10-CM | POA: Diagnosis not present

## 2018-01-13 DIAGNOSIS — N170 Acute kidney failure with tubular necrosis: Secondary | ICD-10-CM | POA: Diagnosis present

## 2018-01-13 DIAGNOSIS — L89610 Pressure ulcer of right heel, unstageable: Secondary | ICD-10-CM | POA: Diagnosis present

## 2018-01-13 DIAGNOSIS — A419 Sepsis, unspecified organism: Secondary | ICD-10-CM | POA: Diagnosis present

## 2018-01-13 DIAGNOSIS — N390 Urinary tract infection, site not specified: Secondary | ICD-10-CM | POA: Diagnosis present

## 2018-01-13 DIAGNOSIS — G9341 Metabolic encephalopathy: Secondary | ICD-10-CM | POA: Diagnosis present

## 2018-01-13 DIAGNOSIS — N186 End stage renal disease: Secondary | ICD-10-CM | POA: Diagnosis present

## 2018-01-13 DIAGNOSIS — Z833 Family history of diabetes mellitus: Secondary | ICD-10-CM

## 2018-01-13 DIAGNOSIS — E43 Unspecified severe protein-calorie malnutrition: Secondary | ICD-10-CM | POA: Diagnosis present

## 2018-01-13 DIAGNOSIS — Z7401 Bed confinement status: Secondary | ICD-10-CM

## 2018-01-13 DIAGNOSIS — I2510 Atherosclerotic heart disease of native coronary artery without angina pectoris: Secondary | ICD-10-CM | POA: Diagnosis present

## 2018-01-13 DIAGNOSIS — R6520 Severe sepsis without septic shock: Secondary | ICD-10-CM | POA: Diagnosis not present

## 2018-01-13 DIAGNOSIS — Z452 Encounter for adjustment and management of vascular access device: Secondary | ICD-10-CM

## 2018-01-13 DIAGNOSIS — N19 Unspecified kidney failure: Secondary | ICD-10-CM

## 2018-01-13 DIAGNOSIS — I480 Paroxysmal atrial fibrillation: Secondary | ICD-10-CM | POA: Diagnosis present

## 2018-01-13 DIAGNOSIS — E872 Acidosis: Secondary | ICD-10-CM | POA: Diagnosis present

## 2018-01-13 DIAGNOSIS — I739 Peripheral vascular disease, unspecified: Secondary | ICD-10-CM | POA: Diagnosis not present

## 2018-01-13 DIAGNOSIS — K7200 Acute and subacute hepatic failure without coma: Secondary | ICD-10-CM | POA: Diagnosis present

## 2018-01-13 DIAGNOSIS — I70249 Atherosclerosis of native arteries of left leg with ulceration of unspecified site: Secondary | ICD-10-CM | POA: Diagnosis not present

## 2018-01-13 DIAGNOSIS — E11621 Type 2 diabetes mellitus with foot ulcer: Secondary | ICD-10-CM | POA: Diagnosis present

## 2018-01-13 DIAGNOSIS — I70235 Atherosclerosis of native arteries of right leg with ulceration of other part of foot: Secondary | ICD-10-CM | POA: Diagnosis not present

## 2018-01-13 DIAGNOSIS — E875 Hyperkalemia: Secondary | ICD-10-CM | POA: Diagnosis present

## 2018-01-13 DIAGNOSIS — R4182 Altered mental status, unspecified: Secondary | ICD-10-CM | POA: Diagnosis not present

## 2018-01-13 DIAGNOSIS — Z923 Personal history of irradiation: Secondary | ICD-10-CM

## 2018-01-13 DIAGNOSIS — Z9079 Acquired absence of other genital organ(s): Secondary | ICD-10-CM

## 2018-01-13 DIAGNOSIS — L89159 Pressure ulcer of sacral region, unspecified stage: Secondary | ICD-10-CM | POA: Diagnosis present

## 2018-01-13 DIAGNOSIS — D631 Anemia in chronic kidney disease: Secondary | ICD-10-CM | POA: Diagnosis present

## 2018-01-13 DIAGNOSIS — E039 Hypothyroidism, unspecified: Secondary | ICD-10-CM | POA: Diagnosis present

## 2018-01-13 DIAGNOSIS — E860 Dehydration: Secondary | ICD-10-CM | POA: Diagnosis present

## 2018-01-13 DIAGNOSIS — L97529 Non-pressure chronic ulcer of other part of left foot with unspecified severity: Secondary | ICD-10-CM | POA: Diagnosis present

## 2018-01-13 DIAGNOSIS — Z6822 Body mass index (BMI) 22.0-22.9, adult: Secondary | ICD-10-CM

## 2018-01-13 DIAGNOSIS — R748 Abnormal levels of other serum enzymes: Secondary | ICD-10-CM | POA: Diagnosis not present

## 2018-01-13 DIAGNOSIS — I70299 Other atherosclerosis of native arteries of extremities, unspecified extremity: Secondary | ICD-10-CM

## 2018-01-13 DIAGNOSIS — R945 Abnormal results of liver function studies: Secondary | ICD-10-CM

## 2018-01-13 DIAGNOSIS — L89620 Pressure ulcer of left heel, unstageable: Secondary | ICD-10-CM | POA: Diagnosis present

## 2018-01-13 DIAGNOSIS — Z993 Dependence on wheelchair: Secondary | ICD-10-CM

## 2018-01-13 DIAGNOSIS — B965 Pseudomonas (aeruginosa) (mallei) (pseudomallei) as the cause of diseases classified elsewhere: Secondary | ICD-10-CM | POA: Diagnosis not present

## 2018-01-13 DIAGNOSIS — Z903 Acquired absence of stomach [part of]: Secondary | ICD-10-CM

## 2018-01-13 DIAGNOSIS — G4733 Obstructive sleep apnea (adult) (pediatric): Secondary | ICD-10-CM | POA: Diagnosis present

## 2018-01-13 DIAGNOSIS — I340 Nonrheumatic mitral (valve) insufficiency: Secondary | ICD-10-CM | POA: Diagnosis not present

## 2018-01-13 DIAGNOSIS — R001 Bradycardia, unspecified: Secondary | ICD-10-CM | POA: Diagnosis not present

## 2018-01-13 DIAGNOSIS — G9340 Encephalopathy, unspecified: Secondary | ICD-10-CM | POA: Diagnosis not present

## 2018-01-13 DIAGNOSIS — E1122 Type 2 diabetes mellitus with diabetic chronic kidney disease: Secondary | ICD-10-CM | POA: Diagnosis present

## 2018-01-13 LAB — URINALYSIS, COMPLETE (UACMP) WITH MICROSCOPIC
BILIRUBIN URINE: NEGATIVE
GLUCOSE, UA: NEGATIVE mg/dL
Ketones, ur: NEGATIVE mg/dL
Nitrite: POSITIVE — AB
PROTEIN: 30 mg/dL — AB
SPECIFIC GRAVITY, URINE: 1.014 (ref 1.005–1.030)
pH: 5 (ref 5.0–8.0)

## 2018-01-13 LAB — TROPONIN I
Troponin I: 0.04 ng/mL (ref <0.03)
Troponin I: 0.04 ng/mL (ref <0.03)

## 2018-01-13 LAB — CBC WITH DIFFERENTIAL/PLATELET
BASOS ABS: 0 10*3/uL (ref 0–0.1)
Basophils Relative: 0 %
Eosinophils Absolute: 0 10*3/uL (ref 0–0.7)
Eosinophils Relative: 0 %
HCT: 30.8 % — ABNORMAL LOW (ref 40.0–52.0)
Hemoglobin: 10.3 g/dL — ABNORMAL LOW (ref 13.0–18.0)
Lymphocytes Relative: 8 %
Lymphs Abs: 1.3 10*3/uL (ref 1.0–3.6)
MCH: 31.4 pg (ref 26.0–34.0)
MCHC: 33.4 g/dL (ref 32.0–36.0)
MCV: 94.2 fL (ref 80.0–100.0)
MONO ABS: 0.6 10*3/uL (ref 0.2–1.0)
MONOS PCT: 4 %
Neutro Abs: 13.7 10*3/uL — ABNORMAL HIGH (ref 1.4–6.5)
Neutrophils Relative %: 88 %
Platelets: 389 10*3/uL (ref 150–440)
RBC: 3.28 MIL/uL — ABNORMAL LOW (ref 4.40–5.90)
RDW: 16 % — ABNORMAL HIGH (ref 11.5–14.5)
WBC: 15.6 10*3/uL — ABNORMAL HIGH (ref 3.8–10.6)

## 2018-01-13 LAB — COMPREHENSIVE METABOLIC PANEL
ALBUMIN: 2.6 g/dL — AB (ref 3.5–5.0)
ALK PHOS: 317 U/L — AB (ref 38–126)
ALT: 256 U/L — AB (ref 0–44)
ALT: 239 U/L — ABNORMAL HIGH (ref 0–44)
AST: 364 U/L — AB (ref 15–41)
AST: 275 U/L — AB (ref 15–41)
Albumin: 2.5 g/dL — ABNORMAL LOW (ref 3.5–5.0)
Alkaline Phosphatase: 287 U/L — ABNORMAL HIGH (ref 38–126)
Anion gap: 11 (ref 5–15)
Anion gap: 9 (ref 5–15)
BILIRUBIN TOTAL: 0.8 mg/dL (ref 0.3–1.2)
BUN: 89 mg/dL — AB (ref 8–23)
BUN: 84 mg/dL — AB (ref 8–23)
CALCIUM: 7.8 mg/dL — AB (ref 8.9–10.3)
CHLORIDE: 108 mmol/L (ref 98–111)
CO2: 20 mmol/L — ABNORMAL LOW (ref 22–32)
CO2: 18 mmol/L — ABNORMAL LOW (ref 22–32)
CREATININE: 3.86 mg/dL — AB (ref 0.61–1.24)
CREATININE: 3.68 mg/dL — AB (ref 0.61–1.24)
Calcium: 7.3 mg/dL — ABNORMAL LOW (ref 8.9–10.3)
Chloride: 105 mmol/L (ref 98–111)
GFR calc Af Amer: 16 mL/min — ABNORMAL LOW (ref >60)
GFR calc Af Amer: 17 mL/min — ABNORMAL LOW (ref >60)
GFR calc non Af Amer: 14 mL/min — ABNORMAL LOW (ref >60)
GFR, EST NON AFRICAN AMERICAN: 15 mL/min — AB (ref >60)
GLUCOSE: 97 mg/dL (ref 70–99)
GLUCOSE: 91 mg/dL (ref 70–99)
Potassium: 5.4 mmol/L — ABNORMAL HIGH (ref 3.5–5.1)
Potassium: 5.5 mmol/L — ABNORMAL HIGH (ref 3.5–5.1)
Sodium: 136 mmol/L (ref 135–145)
Sodium: 135 mmol/L (ref 135–145)
Total Bilirubin: 0.7 mg/dL (ref 0.3–1.2)
Total Protein: 6.1 g/dL — ABNORMAL LOW (ref 6.5–8.1)
Total Protein: 5.8 g/dL — ABNORMAL LOW (ref 6.5–8.1)

## 2018-01-13 LAB — LACTIC ACID, PLASMA
Lactic Acid, Venous: 2.8 mmol/L (ref 0.5–1.9)
Lactic Acid, Venous: 3.2 mmol/L (ref 0.5–1.9)

## 2018-01-13 LAB — TSH: TSH: 10.319 u[IU]/mL — AB (ref 0.350–4.500)

## 2018-01-13 LAB — AMMONIA: Ammonia: 13 umol/L (ref 9–35)

## 2018-01-13 LAB — GLUCOSE, CAPILLARY
GLUCOSE-CAPILLARY: 87 mg/dL (ref 70–99)
GLUCOSE-CAPILLARY: 80 mg/dL (ref 70–99)
Glucose-Capillary: 90 mg/dL (ref 70–99)

## 2018-01-13 LAB — ACETAMINOPHEN LEVEL: Acetaminophen (Tylenol), Serum: 14 ug/mL (ref 10–30)

## 2018-01-13 LAB — MRSA PCR SCREENING: MRSA by PCR: NEGATIVE

## 2018-01-13 MED ORDER — SODIUM CHLORIDE 0.9 % IV SOLN
INTRAVENOUS | Status: DC
  Administered 2018-01-13: 19:00:00 via INTRAVENOUS

## 2018-01-13 MED ORDER — ONDANSETRON HCL 4 MG PO TABS: 4.0000 mg | ORAL_TABLET | Freq: Four times a day (QID) | ORAL | Status: DC | PRN

## 2018-01-13 MED ORDER — ACETAMINOPHEN 325 MG PO TABS
650.0000 mg | ORAL_TABLET | Freq: Four times a day (QID) | ORAL | Status: DC | PRN
  Administered 2018-01-13 – 2018-01-15 (×2): 650 mg via ORAL
  Filled 2018-01-13 (×2): qty 2

## 2018-01-13 MED ORDER — VANCOMYCIN HCL IN DEXTROSE 1-5 GM/200ML-% IV SOLN
1000.0000 mg | Freq: Once | INTRAVENOUS | Status: AC
  Administered 2018-01-13: 1000 mg via INTRAVENOUS
  Filled 2018-01-13: qty 200

## 2018-01-13 MED ORDER — ACETAMINOPHEN 650 MG RE SUPP: 650.0000 mg | Freq: Four times a day (QID) | RECTAL | Status: DC | PRN

## 2018-01-13 MED ORDER — PIPERACILLIN-TAZOBACTAM 3.375 G IVPB 30 MIN
3.3750 g | Freq: Once | INTRAVENOUS | Status: AC
  Administered 2018-01-13: 3.375 g via INTRAVENOUS
  Filled 2018-01-13: qty 50

## 2018-01-13 MED ORDER — PIPERACILLIN-TAZOBACTAM 3.375 G IVPB
3.3750 g | Freq: Two times a day (BID) | INTRAVENOUS | Status: DC
  Administered 2018-01-13 – 2018-01-16 (×6): 3.375 g via INTRAVENOUS
  Filled 2018-01-13 (×6): qty 50

## 2018-01-13 MED ORDER — ONDANSETRON HCL 4 MG/2ML IJ SOLN
4.0000 mg | Freq: Four times a day (QID) | INTRAMUSCULAR | Status: DC | PRN
  Administered 2018-01-13 – 2018-01-15 (×2): 4 mg via INTRAVENOUS
  Filled 2018-01-13 (×2): qty 2

## 2018-01-13 MED ORDER — DEXTROSE 50 % IV SOLN
1.0000 | Freq: Once | INTRAVENOUS | Status: AC
  Administered 2018-01-13: 50 mL via INTRAVENOUS
  Filled 2018-01-13: qty 50

## 2018-01-13 MED ORDER — DOPAMINE-DEXTROSE 3.2-5 MG/ML-% IV SOLN
0.0000 ug/kg/min | INTRAVENOUS | Status: DC
  Administered 2018-01-13: 10 ug/kg/min via INTRAVENOUS
  Administered 2018-01-13: 5 ug/kg/min via INTRAVENOUS
  Administered 2018-01-14 – 2018-01-16 (×2): 7 ug/kg/min via INTRAVENOUS
  Filled 2018-01-13 (×3): qty 250

## 2018-01-13 MED ORDER — LACTATED RINGERS IV SOLN
INTRAVENOUS | Status: DC
  Administered 2018-01-13: 23:00:00 via INTRAVENOUS
  Administered 2018-01-13: 100 mL/h via INTRAVENOUS

## 2018-01-13 MED ORDER — ALLOPURINOL 300 MG PO TABS
300.0000 mg | ORAL_TABLET | Freq: Every day | ORAL | Status: DC
  Administered 2018-01-14 – 2018-01-16 (×3): 300 mg via ORAL
  Filled 2018-01-13 (×3): qty 1

## 2018-01-13 MED ORDER — SODIUM CHLORIDE 0.9 % IV SOLN
Freq: Once | INTRAVENOUS | Status: AC
  Administered 2018-01-13: 15:00:00 via INTRAVENOUS

## 2018-01-13 MED ORDER — SODIUM CHLORIDE 0.9 % IV SOLN
1000.0000 mL | Freq: Once | INTRAVENOUS | Status: AC
  Administered 2018-01-13: 1000 mL via INTRAVENOUS

## 2018-01-13 MED ORDER — ROSUVASTATIN CALCIUM 10 MG PO TABS
20.0000 mg | ORAL_TABLET | Freq: Every day | ORAL | Status: DC
  Administered 2018-01-14: 20 mg via ORAL
  Filled 2018-01-13: qty 2

## 2018-01-13 MED ORDER — NALOXONE HCL 2 MG/2ML IJ SOSY
0.4000 mg | PREFILLED_SYRINGE | Freq: Once | INTRAMUSCULAR | Status: AC
  Administered 2018-01-13: 0.4 mg via INTRAVENOUS
  Filled 2018-01-13: qty 2

## 2018-01-13 MED ORDER — VANCOMYCIN HCL IN DEXTROSE 750-5 MG/150ML-% IV SOLN
750.0000 mg | INTRAVENOUS | Status: DC
  Filled 2018-01-13: qty 150

## 2018-01-13 MED ORDER — INSULIN ASPART 100 UNIT/ML IV SOLN
5.0000 [IU] | Freq: Once | INTRAVENOUS | Status: AC
  Administered 2018-01-13: 5 [IU] via INTRAVENOUS
  Filled 2018-01-13: qty 0.05

## 2018-01-13 MED ORDER — INSULIN ASPART 100 UNIT/ML ~~LOC~~ SOLN
0.0000 [IU] | Freq: Three times a day (TID) | SUBCUTANEOUS | Status: DC
  Administered 2018-01-14 – 2018-01-15 (×3): 2 [IU] via SUBCUTANEOUS
  Administered 2018-01-15: 3 [IU] via SUBCUTANEOUS
  Administered 2018-01-16: 2 [IU] via SUBCUTANEOUS
  Administered 2018-01-16 (×2): 1 [IU] via SUBCUTANEOUS
  Filled 2018-01-13 (×7): qty 1

## 2018-01-13 MED ORDER — DOPAMINE-DEXTROSE 3.2-5 MG/ML-% IV SOLN
INTRAVENOUS | Status: AC
  Administered 2018-01-13: 10 ug/kg/min via INTRAVENOUS
  Filled 2018-01-13: qty 250

## 2018-01-13 MED ORDER — APIXABAN 2.5 MG PO TABS
2.5000 mg | ORAL_TABLET | Freq: Two times a day (BID) | ORAL | Status: DC
  Administered 2018-01-13 – 2018-01-14 (×2): 2.5 mg via ORAL
  Filled 2018-01-13 (×2): qty 1

## 2018-01-13 NOTE — ED Notes (Signed)
ED Provider at bedside. 

## 2018-01-13 NOTE — ED Notes (Signed)
Pt more awake and alert now after Narcan administration. Pt able to tell me that his cold and wants blankets. Pt provided blankets.

## 2018-01-13 NOTE — ED Notes (Signed)
Resumed care from [facility] rn.  Pt alert   Skin warm and dry.  Family with pt.  Iv meds infusing.  Pt waiting on admission.

## 2018-01-13 NOTE — Progress Notes (Signed)
Admitted to ICU 6 via stretcher. Sacral wounds x 3 present as well as bilateral gauze dressings on heels. Patient states he just had foot surgery. When patient rolled to assess buttocks and sacral area red but blanched. Patient alert and oriented but very forgetful. States son is health care power of attorney but still discusses placement of central line. Argumentative to doctor,nurse and son. Patient unable to turn self but states he works with PT at nursing home.

## 2018-01-13 NOTE — ED Triage Notes (Signed)
To EMS from [facility] c/o altered mental status that began this AM. SB with EMS of mid 40's. CBG 120. BP 98/46, 96% on RA. Pt points to abdomen when asked if he is pain. Pt falling asleep during time of triage.

## 2018-01-13 NOTE — ED Notes (Signed)
Patient transported to X-ray 

## 2018-01-13 NOTE — Progress Notes (Signed)
Advanced care plan.  Purpose of the Encounter: CODE STATUS  Parties in Attendance: Patient and his son  Patient's Decision Capacity: Intact  Subjective/Patient's story: Patient is a 77 year old with history of chronic systolic CHF, diabetes type 2, excisional atrial fibrillation, hypertension, gastric ulcer, coronary artery disease who is presenting to the ER with complaint of altered mental status.  Patient noted to have sepsis   Objective/Medical story  I discussed with the son regarding his father's wishes for cardiac and pulmonary resuscitation he states that patient has DNR in place which is currently at his house.  Goals of care determination: DNR confirmed    CODE STATUS: DNR confirmed   Time spent discussing advanced care planning: 16 minutes

## 2018-01-13 NOTE — Consult Note (Signed)
[name], M.D Pulmonary Critical Care & Sleep Medicine   ICU Patient Progress Note  Name: Gregory Tyler  DOB: 26-Apr-1940  MRN: [id number]  Date: 01/13/2018   [x] I have reviewed the flowsheet and previous day's notes.  IMPRESSION:   Altered mental status: Most likely related to sepsis, patient is currently alert awake oriented x3 able to move all 4 extremities, head CT is okay  Severe sepsis: Possible urinary source, panculture sent, started on Vanco and Zosyn  Septic shock: Received 3 L of fluid, as patient is slightly bradycardic will continue with dopamine, taper as tolerated, continue with IV fluids, change normal saline to LR as chloride is already going up  Renal failure: Most likely due to severe sepsis/septic shock and dehydration, renal consult is done, avoid nephrotoxic medication, IV fluids, monitor, Foley catheter attempt was unsuccessful, will do bladder scan if more than 200 cc will consider urology consult for Foley catheter placement.  Renal ultrasound  Lactic acidosis: Most likely due to sepsis/septic shock, patient was on metformin at home, monitor  Atrial fibrillation: Hold metoprolol, continue with Eliquis  Diabetes: Sliding scale coverage  Nausea: Most likely related to sepsis if continue to have issues will consider abdominal CT  Abnormal LFTs: Most likely due to shock and hypotension, will consider right upper quadrant sono  PLAN:  CVS: Hypotension probably due to sepsis, last echo done in May 2019 showed preserved EF, serial cardiac enzymes, mild leak of troponin most likely due to sepsis, continue Eliquis for A. fib and monitor, keep her dopamine as tolerated RS: On room air doing well, some history suggest that he has history of emphysema use bronchodilators as needed currently not showing any signs of cough or wheezing. -- Aspiration precaution ID: Panculture, Vanco plus Zosyn, renal ultrasound and right upper quadrant sono, check lipase -- Follow  culture and adjust ENDO: Check TSH metformin, hold metformin -- SSI monitor blood sugar GI: Monitor LFTs, right upper quadrant sono RENAL: Renal consult appreciated, avoid nephrotoxic medication, IV fluids, serial bladder scan if more than 200 consider urology consult for Foley placement -- Electrolyte replacement protocol, Monitor Cr and K -Potassium was 5.4 will recheck and address accordingly CNS: AMS improved, head CT okay HEMATOLOGY: Monitor H&H -- Monitor HB and PLT MUSCULOSKELETAL: No issues PAIN AND SEDATION: PRN meds  Skin/Wound: Sacral decubitus, heel decubitus might be a source of sepsis also  Electrolytes: Replace electrolytes per ICU electrolyte replacement protocol.   IVF: none  Nutrition: Diet as tolerated  Prophylaxis: DVT Prophylaxis with Eliquis,. GI Prophylaxis.   Restraints: None  PT/OT eval and treat. OOB when appropriate.   Lines/Tubes: Not able to place Foley left subclavian 01/13/2018 central line. ADVANCE DIRECTIVE: DNR/DNI FAMILY DISCUSSION: Spoke with the son at length Quality Care: PPI, DVT prophylaxis, HOB elevated, Infection control all reviewed and addressed. Events and notes from last 24 hours reviewed. Care plan discussed on multidisciplinary rounds CC TIME: 45 minutes excluding procedures.   Subjective: Gregory Tyler  is a 77 y.o. male with a known history of chronic systolic CHF, diabetes type 2, paroxysmal atrial fibrillation, coronary artery disease, gastric ulcer, hypertension, hyperlipidemia who currently resides in a skilled nursing facility brought from there for altered mental status.  Patient son states that his father was in usual state of health yesterday however today he was confused and lethargic.  Patient noted to be hypotensive, noted to have acute renal failure, noted to have elevated liver function test.  Also noticed to have UTI.  Patient  was transferred to ICU as patient required low-dose dopamine - Already been seen by renal  consult - Foley catheter attempt was unsuccessful - After extensive discussion with the family patient was agreeable for central line - Left subclavian central line was placed in the ICU   [] The patient is critically ill on     [] Mechanical ventilation [] Pressors  [] BiPAP []    MEDICATIONS: @HOMEMEDS @   [START ON 01/14/2018] allopurinol 300 mg Daily  apixaban 2.5 mg Q12H  insulin aspart 0-9 Units TID WC  [START ON 01/14/2018] rosuvastatin 20 mg Daily    ALLERGIES: Allergies  Allergen Reactions  . Aspirin     GI Bleeding  . Codeine Other (See Comments)    BLISTERS Other reaction(s): Other (See Comments) BLISTERS   . Codeine Sulfate Other (See Comments)    Other reaction(s): Other (See Comments) BLISTERS BLISTERS    PAST MEDICAL HISTORY:   Past Medical History:  Diagnosis Date  . Anemia   . Anxiety   . Arthritis   . Cardiomyopathy ([place])    a. 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  Marland Kitchen Chronic combined systolic (congestive) and diastolic (congestive) heart failure ([place])    a. 05/2015 TEE: EF 35-40% (in setting of Afib).  . Chronic Pelvic pain   . Coronary artery disease    a. 05/2015 CTA Chest: 3 vessel coronary Ca2+.  . Depression   . Diabetes mellitus without complication ([place])   . Emphysema lung ([place])   . Gastric ulcer   . Gout   . Hyperlipidemia   . Hypertension   . Nephrolithiasis   . PAF (paroxysmal atrial fibrillation) ([place])    a. 05/2015 s/p DCCV; b. 03/2016 s/p DCCV; c. CHA2DS2VASc = 6 (coumadin).  . Prostate cancer [facility])    Prostate  . Stage III chronic kidney disease ([place])   . Weight loss    50 lb since Apr 24, 2015    PAST SURGICAL HISTORY: Past Surgical History:  Procedure Laterality Date  . APPENDECTOMY    . BACK SURGERY    . CHOLECYSTECTOMY    . ELECTROPHYSIOLOGIC STUDY N/A 06/17/2015   Procedure: CARDIOVERSION;  Surgeon: Wellington Hampshire, MD;  Location: [facility] ORS;  Service: Cardiovascular;  Laterality: N/A;  .  ELECTROPHYSIOLOGIC STUDY N/A 03/30/2016   Procedure: CARDIOVERSION;  Surgeon: Minna Merritts, MD;  Location: [facility] ORS;  Service: Cardiovascular;  Laterality: N/A;  . GASTRIC BYPASS    . HEMORRHOID SURGERY    . pilonidal cyst    . TEE WITHOUT CARDIOVERSION N/A 06/17/2015   Procedure: TRANSESOPHAGEAL ECHOCARDIOGRAM (TEE);  Surgeon: Wellington Hampshire, MD;  Location: [facility] ORS;  Service: Cardiovascular;  Laterality: N/A;    PAST SOCIAL HISTORY: Social History   Socioeconomic History  . Marital status: Married    Spouse name: Not on file  . Number of children: Not on file  . Years of education: Not on file  . Highest education level: Not on file  Occupational History  . Occupation: retired  Scientific laboratory technician  . Financial resource strain: Not on file  . Food insecurity:    Worry: Patient refused    Inability: Patient refused  . Transportation needs:    Medical: Patient refused    Non-medical: Patient refused  Tobacco Use  . Smoking status: Former Smoker    Last attempt to quit: 12/19/1940    Years since quitting: 77.1  . Smokeless tobacco: Former Systems developer    Types: Risk analyst  date: 11/19/2014  Substance and Sexual Activity  . Alcohol use: No  . Drug use: No  . Sexual activity: Not Currently  Lifestyle  . Physical activity:    Days per week: 0 days    Minutes per session: 0 min  . Stress: Patient refused  Relationships  . Social connections:    Talks on phone: More than three times a week    Gets together: Twice a week    Attends religious service: Not on file    Active member of club or organization: No    Attends meetings of clubs or organizations: Never    Relationship status: Married  . Intimate partner violence:    Fear of current or ex partner: Patient refused    Emotionally abused: Patient refused    Physically abused: Patient refused    Forced sexual activity: Patient refused  Other Topics Concern  . Not on file  Social History Narrative   Retired.  Lives iwith son      FAMILY HISTORY: Family History  Problem Relation Age of Onset  . CAD Mother   . Stroke Mother   . COPD Mother   . Heart failure Mother   . Diabetes Other     Vital Signs:   BP (!) 90/50   Pulse (!) 51   Temp (!) 97 F (36.1 C) (Axillary)   Resp 12   Ht 5' 8"  (1.727 m)   Wt 67.5 kg   SpO2 93%   BMI 22.63 kg/m       Prehospital Care Cardiac Rhythm: Sinus bradycardia  Temp (24hrs), Avg:97.3 F (36.3 C), Min:97 F (36.1 C), Max:97.6 F (36.4 C)   Intake/Output:Last shift:      No intake/output data recorded.Last 3 shifts: No intake/output data recorded. No intake or output data in the 24 hours ending 01/13/18 1809 Hemodynamics:  .@MAP     .@CVP       Objective:   Physical Exam:  General: comfortable, no acute distress HEENT: pupils reactive, sclera anicteric, EOM intact Neck: No adenopathy or thyroid swelling, no lymphadenopathy or JVD, supple CVS: S1S2 no murmurs RS: Mod AE bilaterally, Abd: soft, non tender, no hepatosplenomegaly Neuro: non focal, awake, alert Extrm: Heal dressings sacral decub    CBC w/Diff Recent Labs  Lab 01/13/18 1339  HGB 10.3*  HCT 30.8*  WBC 15.6*  PLT 389     Chemistry Recent Labs  Lab 01/13/18 1339  NA 136  K 5.4*  CL 105  CO2 20*  BUN 89*  CREATININE 3.86*  GLUCOSE 97    Hepatic Function Latest Ref Rng & Units 01/13/2018 12/20/2017 12/19/2017  Total Protein 6.5 - 8.1 g/dL 6.1(L) 4.6(L) 6.1(L)  Albumin 3.5 - 5.0 g/dL 2.6(L) 2.0(L) 2.7(L)  AST 15 - 41 U/L 364(H) 18 24  ALT 0 - 44 U/L 256(H) 14 17  Alk Phosphatase 38 - 126 U/L 317(H) 77 98  Total Bilirubin 0.3 - 1.2 mg/dL 0.7 0.6 0.8     Lactic Acid    Component Value Date/Time   LATICACIDVEN 3.2 (HH) 01/13/2018 1709     Micro  @MICRO @ No results found for this or any previous visit (from the past 720 hour(s)).   ABG No results found for: PHART, PCO2ART, PO2ART, HCO3, TCO2, ACIDBASEDEF, O2SAT   Cardiac Enzymes Recent Labs    01/13/18 1339  TROPONINI  0.04*      Coagulation Lab Results  Component Value Date   INR 1.75 11/19/2017     Radiology Results Reviewed  by me and compared with previous imaging studies Ct Head Wo Contrast  Result Date: 01/13/2018 CLINICAL DATA:  Altered level consciousness beginning this morning. History of hypertension, hyperlipidemia, recurrent prostate cancer. EXAM: CT HEAD WITHOUT CONTRAST TECHNIQUE: Contiguous axial images were obtained from the base of the skull through the vertex without intravenous contrast. COMPARISON:  None. FINDINGS: BRAIN: No intraparenchymal hemorrhage, mass effect nor midline shift. The ventricles and sulci are normal for age. Patchy to confluent supratentorial white matter hypodensities. Small area of focal blurring RIGHT posterior frontal gray-white matter junction (series 5, image 14). No abnormal extra-axial fluid collections. Basal cisterns are patent. VASCULAR: Moderate calcific atherosclerosis of the carotid siphons. SKULL: No skull fracture. No significant scalp soft tissue swelling. SINUSES/ORBITS: Paranasal sinuses are well aerated. Mastoid air cells are well aerated.The included ocular globes and orbital contents are non-suspicious. OTHER: None. IMPRESSION: 1. 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 2. Moderate to severe chronic small vessel ischemic changes. Electronically Signed   By: Elon Alas M.D.   On: 01/13/2018 14:36   Dg Abdomen Acute W/chest  Result Date: 01/13/2018 CLINICAL DATA:  Abdominal pain with altered mental status EXAM: DG ABDOMEN ACUTE W/ 1V CHEST COMPARISON:  Chest radiograph Sep 11, 2017; chest CT November 15, 2017 FINDINGS: PA chest: Lungs are clear. Heart size and pulmonary vascularity are normal. No adenopathy. There is aortic atherosclerosis. There are surgical clips in the upper abdomen. Supine and upright abdomen: There is fairly diffuse stool throughout the colon. There is no bowel dilatation or air-fluid level to suggest bowel  obstruction. No free air. There is postoperative change in the lower lumbar region. There are multiple surgical clips in the upper abdomen. There are also surgical clips in the pelvis. IMPRESSION: Multiple areas of postoperative change in the abdomen pelvis. Diffuse stool throughout much of the colon. No evident bowel obstruction or free air. No lung edema or consolidation. There is aortic atherosclerosis. Aortic Atherosclerosis (ICD10-I70.0). Electronically Signed   By: Lowella Grip III M.D.   On: 01/13/2018 14:27      ECHO    [name], MD 01/13/2018   The patient is: [x]  acutely ill Risk of deterioration: []  moderate   [x]  critically ill  []  high   [x] See my orders for details  My assessment, plan of care, findings, medications, side effects etc were discussed with: [x] nursing [] PT/OT   [x] respiratory therapy [] Dr.  [] family []    Morrison Old, M.D. Pulmonary Critical Care & Sleep Medicine

## 2018-01-13 NOTE — Consult Note (Addendum)
Pharmacy Antibiotic Note  Gregory Tyler is a 77 y.o. male admitted on 01/13/2018 with sepsis.  Pharmacy has been consulted for Vancomycin and Zosyn  Dosing. Patient received vancomycin 1g IV and Zosyn 3.375 IV x 1 dose in ED. Scr: 3.86 on admission, Baseline around is around 1.8.   Plan: Ke:0.017  VD: 47   T1/2: 41 hrs   Start Vancomycin 750 IV every 48 hours with 14 hour stack dosing.  Goal trough 15-20 mcg/mL.The dose of vancomycin will be adjusted as needed based on renal function. Will order random level prior to 2nd dose to make sure patient is clearing vancomycin.   Start Zosyn 3.375g IV q12h (4 hour infusion) based on CrCl < 81mL/min    Height: 5\' 8"  (172.7 cm) Weight: 148 lb 13 oz (67.5 kg) IBW/kg (Calculated) : 68.4  Temp (24hrs), Avg:97.6 F (36.4 C), Min:97.6 F (36.4 C), Max:97.6 F (36.4 C)  Recent Labs  Lab 01/13/18 1339  WBC 15.6*  CREATININE 3.86*  LATICACIDVEN 2.8*    Estimated Creatinine Clearance: 15.3 mL/min (A) (by C-G formula based on SCr of 3.86 mg/dL (H)).    Allergies  Allergen Reactions  . Aspirin     GI Bleeding  . Codeine Other (See Comments)    BLISTERS Other reaction(s): Other (See Comments) BLISTERS   . Codeine Sulfate Other (See Comments)    Other reaction(s): Other (See Comments) BLISTERS BLISTERS    Antimicrobials this admission: 9/23 vancomycin >>  9/23 Zosyn >>   Microbiology results: 9/23 BCx: pending   Thank you for allowing pharmacy to be a part of this patient's care.  Pernell Dupre, PharmD, BCPS Clinical Pharmacist 01/13/2018 3:46 PM

## 2018-01-13 NOTE — Progress Notes (Signed)
Requested reassessment of bed assignment.Spoke with Tor Netters.

## 2018-01-13 NOTE — Procedures (Addendum)
Central Venous Catheter Insertion Procedure Note      Procedure Indication: Acute respiratory failure with hypotension Procedure: Insertion of Central Venous Catheter Procedure consent form signed: Emergent  Procedure Performed by: [name] MD Specimen Removed: None Anesthesia: 1% lidocaine  EBL: Minimal  Complications: No apparent complications    Procedure Details:  Type of line: Subclavian implied.  Risks versus benefits of the procedure were discussed with all questions asked and answered.   A "universal pause" was done per [facility] protocol, confirming correct patient, procedure, consent, equipment, and positioning with bedsdie RN.  The patient was monitored with continuous bedside telemetry, pulse oximetry, and non-invasive blood pressure measurements throughout the duration of the procedure.   Using full barrier sterile precautions, the right neck was prepped with Chlorhexidene and draped in the usual sterile fashion.  Local anesthesia was injected into the subcutaneous tissues.   A trochar needle was then inserted into the vein; dark red blood was easily aspirated into the syringe.  Using the Seldinger technique, the syringe was then removed and a J-tip guide wire was advanced easily through the needle. There were no arrhythmias. The needle was then withdrawn over the wire.  A scalpel was then used to make a small skin nick along the guidewire.  A dilator was then passed over the guidewire without difficulty.  The dilator was then removed and a 8.5 fr. quad lumen central venous catheter was passed over the guide wire to an insertion depth of 16 cm. The guide wire was then removed and the catheter was sutured into place using 2-0 silk suture.  All ports easily aspirated dark red blood and were flushed with sterile saline.  A sterile bio occlusive dressing with Chlorhexidine patch was then applied over the insertion site.  Use of Ultrasound No  Post Procedure: The patient tolerated the  procedure well.  There were no known complications at the conclusion of the procedure.  EBL was less than 20mL.        Hannah Strader Manuella Ghazi Pulmonary Critical Care & Sleep Medicine

## 2018-01-13 NOTE — ED Notes (Signed)
Report called to ccu nurse

## 2018-01-13 NOTE — ED Notes (Signed)
Lactic 2.8, troponin 0.04 Verbal to Dr. Lenise Arena.

## 2018-01-13 NOTE — ED Notes (Signed)
Report to Amy, RN

## 2018-01-13 NOTE — Progress Notes (Signed)
[place], [place] 01/13/18  Subjective:   Patient known to our practice from outpatient. Was seen in 2017, then lost to follow up He presented today via EMS [facility] for altered mental status that began this morning.  Patient is not able to provide much meaningful information.  All information is obtained from the chart and primary team  According to ER triage notes, patient was found to be bradycardic and hypotensive with blood pressure 98/46, sats 96% on room air.  Elevated venous lactic acid level of 2.8.  Elevated white count.  Urinalysis shows moderate hemoglobin, large leukocytes, nitrite positive, greater than 50 WBCs. Diagnosed with sepsis and transferred to ICU for further care  Nephrology consult for acute renal failure Baseline creatinine 1.30, GFR 51 from December 20, 2017 Admission creatinine 2.86   Objective:  Vital signs in last 24 hours:  Temp:  [97 F (36.1 C)-97.6 F (36.4 C)] 97 F (36.1 C) (09/23 1700) Pulse Rate:  [42-51] 51 (09/23 1700) Resp:  [7-18] 12 (09/23 1700) BP: (70-94)/(40-51) 90/50 (09/23 1700) SpO2:  [93 %-100 %] 93 % (09/23 1700) Weight:  [67.5 kg] 67.5 kg (09/23 1327)  Weight change:  Filed Weights   01/13/18 1327  Weight: 67.5 kg    Intake/Output:   No intake or output data in the 24 hours ending 01/13/18 1730   Physical Exam: General:  Chronically ill-appearing, laying in the bed, thin cachectic  HEENT  dry oral mucous membranes  Neck  supple  Pulm/lungs  decreased breath sounds at bases  CVS/Heart  irregular, tachycardic  Abdomen:   Soft, nondistended, dry heaving  Extremities:  No peripheral edema, bilateral feet in bandages  Neurologic:  Alert, able to answer few questions,  Skin:  Warm          Basic Metabolic Panel:  Recent Labs  Lab 01/13/18 1339  NA 136  K 5.4*  CL 105  CO2 20*  GLUCOSE 97  BUN 89*  CREATININE 3.86*  CALCIUM 7.8*     CBC: Recent Labs  Lab  01/13/18 1339  WBC 15.6*  NEUTROABS 13.7*  HGB 10.3*  HCT 30.8*  MCV 94.2  PLT 389     No results found for: HEPBSAG, HEPBSAB, HEPBIGM    Microbiology:  No results found for this or any previous visit (from the past 240 hour(s)).  Coagulation Studies: No results for input(s): LABPROT, INR in the last 72 hours.  Urinalysis: Recent Labs    01/13/18 1339  COLORURINE YELLOW*  LABSPEC 1.014  PHURINE 5.0  GLUCOSEU NEGATIVE  HGBUR MODERATE*  BILIRUBINUR NEGATIVE  KETONESUR NEGATIVE  PROTEINUR 30*  NITRITE POSITIVE*  LEUKOCYTESUR LARGE*      Imaging: Ct Head Wo Contrast  Result Date: 01/13/2018 CLINICAL DATA:  Altered level consciousness beginning this morning. History of hypertension, hyperlipidemia, recurrent prostate cancer. EXAM: CT HEAD WITHOUT CONTRAST TECHNIQUE: Contiguous axial images were obtained from the base of the skull through the vertex without intravenous contrast. COMPARISON:  None. FINDINGS: BRAIN: No intraparenchymal hemorrhage, mass effect nor midline shift. The ventricles and sulci are normal for age. Patchy to confluent supratentorial white matter hypodensities. Small area of focal blurring RIGHT posterior frontal gray-white matter junction (series 5, image 14). No abnormal extra-axial fluid collections. Basal cisterns are patent. VASCULAR: Moderate calcific atherosclerosis of the carotid siphons. SKULL: No skull fracture. No significant scalp soft tissue swelling. SINUSES/ORBITS: Paranasal sinuses are well aerated. Mastoid air cells are well aerated.The included ocular globes and orbital contents  are non-suspicious. OTHER: None. IMPRESSION: 1. 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 2. Moderate to severe chronic small vessel ischemic changes. Electronically Signed   By: Elon Alas M.D.   On: 01/13/2018 14:36   Dg Abdomen Acute W/chest  Result Date: 01/13/2018 CLINICAL DATA:  Abdominal pain with altered mental status EXAM: DG  ABDOMEN ACUTE W/ 1V CHEST COMPARISON:  Chest radiograph Sep 11, 2017; chest CT November 15, 2017 FINDINGS: PA chest: Lungs are clear. Heart size and pulmonary vascularity are normal. No adenopathy. There is aortic atherosclerosis. There are surgical clips in the upper abdomen. Supine and upright abdomen: There is fairly diffuse stool throughout the colon. There is no bowel dilatation or air-fluid level to suggest bowel obstruction. No free air. There is postoperative change in the lower lumbar region. There are multiple surgical clips in the upper abdomen. There are also surgical clips in the pelvis. IMPRESSION: Multiple areas of postoperative change in the abdomen pelvis. Diffuse stool throughout much of the colon. No evident bowel obstruction or free air. No lung edema or consolidation. There is aortic atherosclerosis. Aortic Atherosclerosis (ICD10-I70.0). Electronically Signed   By: Lowella Grip III M.D.   On: 01/13/2018 14:27     Medications:   . sodium chloride    . DOPamine 5 mcg/kg/min (01/13/18 1629)  . piperacillin-tazobactam (ZOSYN)  IV    . [START ON 01/14/2018] vancomycin     . [START ON 01/14/2018] allopurinol  300 mg Oral Daily  . apixaban  2.5 mg Oral Q12H  . insulin aspart  0-9 Units Subcutaneous TID WC  . [START ON 01/14/2018] rosuvastatin  20 mg Oral Daily   acetaminophen **OR** acetaminophen, ondansetron **OR** ondansetron (ZOFRAN) IV  Assessment/ Plan:  77 y.o. Caucasian male with medical problems of chronic systolic congestive heart failure, diabetes type 2, atrial fibrillation, hypertension, history of gastric ulcer, coronary disease, who was admitted to [facility] on 01/13/2018 for evaluation of altered mental status.   1.  Acute renal failure on chronic kidney disease stage III Baseline creatinine 1.30/GFR 51 from December 20, 2017 Admission creatinine 3.86.  Patient received volume resuscitation in the emergency room. Because of acute renal failure appears to be multifactorial  including hypotension, sepsis, likely leading to ATN Agree with volume resuscitation as per ICU team May need renal imaging in the form of ultrasound.  Can be done tomorrow No acute indication for dialysis at present  2.  UTI and sepsis Patient is currently getting broad-spectrum antibiotics. Please dose medications for creatinine clearance less than 30     LOS: 0 Syerra Abdelrahman 9/23/20195:30 PM  [place], [place]  Note: This note was prepared with Dragon dictation. Any transcription errors are unintentional

## 2018-01-13 NOTE — ED Provider Notes (Signed)
[facility] Emergency Department Provider Note       Time seen: ----------------------------------------- 1:29 PM on 01/13/2018 -----------------------------------------  Level V caveat: History/ROS limited by altered mental status I have reviewed the triage vital signs and the nursing notes.  HISTORY   Chief Complaint Altered Mental Status    HPI Gregory Tyler is a 77 y.o. male with a history of anemia, anxiety, cardiomyopathy, adult failure to thrive, coronary disease, hyperlipidemia, hypertension, paroxysmal atrial fibrillation who presents to the ED for mental status.  Patient lives at [facility] and has had altered mental status that began this morning.  He was noted to be bradycardic and hypotensive.  Past Medical History:  Diagnosis Date  . Anemia   . Anxiety   . Arthritis   . Cardiomyopathy ([place])    a. 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  Marland Kitchen Chronic combined systolic (congestive) and diastolic (congestive) heart failure ([place])    a. 05/2015 TEE: EF 35-40% (in setting of Afib).  . Chronic Pelvic pain   . Coronary artery disease    a. 05/2015 CTA Chest: 3 vessel coronary Ca2+.  . Depression   . Diabetes mellitus without complication ([place])   . Emphysema lung ([place])   . Gastric ulcer   . Gout   . Hyperlipidemia   . Hypertension   . Nephrolithiasis   . PAF (paroxysmal atrial fibrillation) ([place])    a. 05/2015 s/p DCCV; b. 03/2016 s/p DCCV; c. CHA2DS2VASc = 6 (coumadin).  . Prostate cancer [facility])    Prostate  . Stage III chronic kidney disease ([place])   . Weight loss    50 lb since Apr 24, 2015    Patient Active Problem List   Diagnosis Date Noted  . Atherosclerotic PVD with ulceration ([place]) 01/09/2018  . Diarrhea 12/19/2017  . Pressure injury of skin 11/16/2017  . Enteritis 11/15/2017  . Malnutrition of moderate degree 09/13/2017  . Elevated INR 09/02/2017  . Paroxysmal atrial fibrillation ([place]) 10/25/2016  .  Chronic fatigue   . Encounter for anticoagulation discussion and counseling 09/21/2015  . Hyperlipidemia 08/08/2015  . Diabetes ([place]) 05/19/2015  . Hyperkalemia   . Dehydration   . Orthostatic hypotension 05/09/2015  . Congestive dilated cardiomyopathy ([place]) 05/09/2015  . Polypharmacy 04/27/2015  . Chronic diastolic CHF (congestive heart failure) ([place]) 04/25/2015  . SOB (shortness of breath)   . Coronary artery disease involving native coronary artery of native heart without angina pectoris   . Essential hypertension     Past Surgical History:  Procedure Laterality Date  . APPENDECTOMY    . BACK SURGERY    . CHOLECYSTECTOMY    . ELECTROPHYSIOLOGIC STUDY N/A 06/17/2015   Procedure: CARDIOVERSION;  Surgeon: Wellington Hampshire, MD;  Location: [facility] ORS;  Service: Cardiovascular;  Laterality: N/A;  . ELECTROPHYSIOLOGIC STUDY N/A 03/30/2016   Procedure: CARDIOVERSION;  Surgeon: Minna Merritts, MD;  Location: [facility] ORS;  Service: Cardiovascular;  Laterality: N/A;  . GASTRIC BYPASS    . HEMORRHOID SURGERY    . pilonidal cyst    . TEE WITHOUT CARDIOVERSION N/A 06/17/2015   Procedure: TRANSESOPHAGEAL ECHOCARDIOGRAM (TEE);  Surgeon: Wellington Hampshire, MD;  Location: [facility] ORS;  Service: Cardiovascular;  Laterality: N/A;    Allergies Aspirin; Codeine; and Codeine sulfate  Social History Social History   Tobacco Use  . Smoking status: Former Smoker    Last attempt to quit: 12/19/1940    Years since quitting: 77.1  . Smokeless  tobacco: Former Systems developer    Types: Chew    Quit date: 11/19/2014  Substance Use Topics  . Alcohol use: No  . Drug use: No   Review of Systems Unknown, reported lethargy and abdominal pain  All systems negative/normal/unremarkable except as stated in the HPI  ____________________________________________   PHYSICAL EXAM:  VITAL SIGNS: ED Triage Vitals  Enc Vitals Group     BP 01/13/18 1326 (!) 80/48     Pulse Rate 01/13/18 1326 (!) 44     Resp 01/13/18 1326  14     Temp 01/13/18 1326 97.6 F (36.4 C)     Temp Source 01/13/18 1326 Oral     SpO2 01/13/18 1326 98 %     Weight 01/13/18 1327 148 lb 13 oz (67.5 kg)     Height 01/13/18 1327 5\' 8"  (1.727 m)     Head Circumference --      Peak Flow --      Pain Score --      Pain Loc --      Pain Edu? --      Excl. in [place]? --    Constitutional: Drowsy but responds to verbal or painful stimuli, no obvious distress Eyes: Conjunctivae are normal.  ENT   Head: Normocephalic and atraumatic.   Nose: No congestion/rhinnorhea.   Mouth/Throat: Mucous membranes are moist.   Neck: No stridor. Cardiovascular: Slow rate, regular rhythm. No murmurs, rubs, or gallops. Respiratory: Normal respiratory effort without tachypnea nor retractions. Breath sounds are clear and equal bilaterally.  Gastrointestinal: Nonfocal abdominal tenderness, normal bowel sounds. Musculoskeletal: Nontender with normal range of motion in extremities. Some edema is noted around the ankles Neurologic: Drowsy and lethargic but responds to verbal or painful stimuli. Skin:  Skin is warm, dry and intact.  ____________________________________________  EKG: Interpreted by me.  Sinus bradycardia with a rate of 46 bpm, prolonged PR interval, left bundle branch block, long QT  ____________________________________________  ED COURSE:  As part of my medical decision making, I reviewed the following data within the [place] History obtained from family if available, nursing notes, old chart and ekg, as well as notes from prior ED visits. Patient presented for altered mental status, we will assess with labs and imaging as indicated at this time.   Procedures ____________________________________________   LABS (pertinent positives/negatives)  Labs Reviewed  CBC WITH DIFFERENTIAL/PLATELET - Abnormal; Notable for the following components:      Result Value   WBC 15.6 (*)    RBC 3.28 (*)    Hemoglobin 10.3 (*)     HCT 30.8 (*)    RDW 16.0 (*)    Neutro Abs 13.7 (*)    All other components within normal limits  COMPREHENSIVE METABOLIC PANEL - Abnormal; Notable for the following components:   Potassium 5.4 (*)    CO2 20 (*)    BUN 89 (*)    Creatinine, Ser 3.86 (*)    Calcium 7.8 (*)    Total Protein 6.1 (*)    Albumin 2.6 (*)    AST 364 (*)    ALT 256 (*)    Alkaline Phosphatase 317 (*)    GFR calc non Af Amer 14 (*)    GFR calc Af Amer 16 (*)    All other components within normal limits  TROPONIN I - Abnormal; Notable for the following components:   Troponin I 0.04 (*)    All other components within normal limits  URINALYSIS, COMPLETE (UACMP) WITH  MICROSCOPIC - Abnormal; Notable for the following components:   Color, Urine YELLOW (*)    APPearance CLOUDY (*)    Hgb urine dipstick MODERATE (*)    Protein, ur 30 (*)    Nitrite POSITIVE (*)    Leukocytes, UA LARGE (*)    WBC, UA >50 (*)    Bacteria, UA MANY (*)    All other components within normal limits  LACTIC ACID, PLASMA - Abnormal; Notable for the following components:   Lactic Acid, Venous 2.8 (*)    All other components within normal limits  CULTURE, BLOOD (ROUTINE X 2)  CULTURE, BLOOD (ROUTINE X 2)  AMMONIA  GLUCOSE, CAPILLARY  LACTIC ACID, PLASMA  CBG MONITORING, ED   CRITICAL CARE Performed by: Laurence Aly   Total critical care time: 30 minutes  Critical care time was exclusive of separately billable procedures and treating other patients.  Critical care was necessary to treat or prevent imminent or life-threatening deterioration.  Critical care was time spent personally by me on the following activities: development of treatment plan with patient and/or surrogate as well as nursing, discussions with consultants, evaluation of patient's response to treatment, examination of patient, obtaining history from patient or surrogate, ordering and performing treatments and interventions, ordering and review of  laboratory studies, ordering and review of radiographic studies, pulse oximetry and re-evaluation of patient's condition.  RADIOLOGY Images were viewed by me  CT head, acute abdominal series IMPRESSION: 1. 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 2. Moderate to severe chronic small vessel ischemic changes. IMPRESSION: Multiple areas of postoperative change in the abdomen pelvis. Diffuse stool throughout much of the colon. No evident bowel obstruction or free air. No lung edema or consolidation. There is aortic atherosclerosis.  Aortic Atherosclerosis (ICD10-I70.0). ____________________________________________  DIFFERENTIAL DIAGNOSIS   Dehydration, electrolyte abnormality, CVA, MI, occult infection, sepsis  FINAL ASSESSMENT AND PLAN  Altered mental status, likely sepsis, acute renal failure, age-indeterminate frontal lobe infarct   Plan: The patient had presented for altered mental status. Patient's labs did reveal leukocytosis.  Patient's mental status improved dramatically after Narcan administration as well as IV fluids.  Patient's imaging revealed constipation as well as a small right posterior frontal lobe infarct.  We did start him on multiple liters of IV fluids as well as broad-spectrum antibiotics.  His urine does look infected which is likely the source although he does have peripheral vascular disease and his feet have been causing him significant pain.  His liver function tests are elevated of uncertain etiology, perhaps severe sepsis.  I will discuss with the hospitalist for admission.   Laurence Aly, MD   Note: This note was generated in part or whole with voice recognition software. Voice recognition is usually quite accurate but there are transcription errors that can and very often do occur. I apologize for any typographical errors that were not detected and corrected.     Earleen Newport, MD 01/13/18 1450

## 2018-01-13 NOTE — H&P (Signed)
[facility] [place] NAME: Gregory Tyler    MR#:  [id number]  DATE OF BIRTH:  1940/09/05  DATE OF ADMISSION:  01/13/2018  PRIMARY CARE PHYSICIAN: [name], MD   REQUESTING/REFERRING PHYSICIAN: Earleen Newport, MD  CHIEF COMPLAINT:   Chief Complaint  Patient presents with  . Altered Mental Status    HISTORY OF PRESENT ILLNESS: Gregory Tyler  is a 77 y.o. male with a known history of chronic systolic CHF, diabetes type 2, paroxysmal atrial fibrillation, coronary artery disease, gastric ulcer, hypertension, hyperlipidemia who currently resides in a skilled nursing facility brought from there for altered mental status.  Patient son states that his father was in usual state of health yesterday however today he was confused and lethargic.  Patient noted to be hypotensive, noted to have acute renal failure, noted to have elevated liver function test.  Also noticed to have UTI.     PAST MEDICAL HISTORY:   Past Medical History:  Diagnosis Date  . Anemia   . Anxiety   . Arthritis   . Cardiomyopathy ([place])    a. 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  Marland Kitchen Chronic combined systolic (congestive) and diastolic (congestive) heart failure ([place])    a. 05/2015 TEE: EF 35-40% (in setting of Afib).  . Chronic Pelvic pain   . Coronary artery disease    a. 05/2015 CTA Chest: 3 vessel coronary Ca2+.  . Depression   . Diabetes mellitus without complication ([place])   . Emphysema lung ([place])   . Gastric ulcer   . Gout   . Hyperlipidemia   . Hypertension   . Nephrolithiasis   . PAF (paroxysmal atrial fibrillation) ([place])    a. 05/2015 s/p DCCV; b. 03/2016 s/p DCCV; c. CHA2DS2VASc = 6 (coumadin).  . Prostate cancer [facility])    Prostate  . Stage III chronic kidney disease ([place])   . Weight loss    50 lb since Apr 24, 2015    PAST SURGICAL HISTORY:  Past Surgical History:  Procedure Laterality Date  . APPENDECTOMY    . BACK  SURGERY    . CHOLECYSTECTOMY    . ELECTROPHYSIOLOGIC STUDY N/A 06/17/2015   Procedure: CARDIOVERSION;  Surgeon: Wellington Hampshire, MD;  Location: [facility] ORS;  Service: Cardiovascular;  Laterality: N/A;  . ELECTROPHYSIOLOGIC STUDY N/A 03/30/2016   Procedure: CARDIOVERSION;  Surgeon: Minna Merritts, MD;  Location: [facility] ORS;  Service: Cardiovascular;  Laterality: N/A;  . GASTRIC BYPASS    . HEMORRHOID SURGERY    . pilonidal cyst    . TEE WITHOUT CARDIOVERSION N/A 06/17/2015   Procedure: TRANSESOPHAGEAL ECHOCARDIOGRAM (TEE);  Surgeon: Wellington Hampshire, MD;  Location: [facility] ORS;  Service: Cardiovascular;  Laterality: N/A;    SOCIAL HISTORY:  Social History   Tobacco Use  . Smoking status: Former Smoker    Last attempt to quit: 12/19/1940    Years since quitting: 77.1  . Smokeless tobacco: Former Systems developer    Types: Chew    Quit date: 11/19/2014  Substance Use Topics  . Alcohol use: No    FAMILY HISTORY:  Family History  Problem Relation Age of Onset  . CAD Mother   . Stroke Mother   . COPD Mother   . Heart failure Mother   . Diabetes Other     DRUG ALLERGIES:  Allergies  Allergen Reactions  . Aspirin     GI Bleeding  . Codeine Other (See  Comments)    BLISTERS Other reaction(s): Other (See Comments) BLISTERS   . Codeine Sulfate Other (See Comments)    Other reaction(s): Other (See Comments) BLISTERS BLISTERS    REVIEW OF SYSTEMS:   CONSTITUTIONAL: Unable to provide due to his mental status MEDICATIONS AT HOME:  Prior to Admission medications   Medication Sig Start Date End Date Taking? Authorizing Provider  acetaminophen (TYLENOL) 325 MG tablet Take 2 tablets (650 mg total) by mouth every 6 (six) hours as needed for mild pain (or Fever >/= 101). 09/15/17   Loletha Grayer, MD  allopurinol (ZYLOPRIM) 300 MG tablet Take 300 mg by mouth daily.    [provider]  Elastic Bandages & Supports (MEDICAL COMPRESSION THIGH HIGH) MISC 1 application by Does not apply route  daily. 09/11/17   Merlyn Lot, MD  ELIQUIS 2.5 MG TABS tablet Take 2.5 mg by mouth every 12 (twelve) hours. 12/12/17   [provider]  feeding supplement, ENSURE ENLIVE, (ENSURE ENLIVE) LIQD Take 237 mLs by mouth 2 (two) times daily between meals. 09/15/17   Loletha Grayer, MD  furosemide (LASIX) 20 MG tablet Take 40mg  in the morning and 20 mg in the afternoon Patient taking differently: Take 20 mg by mouth daily as needed for edema.  10/03/17   Minna Merritts, MD  gabapentin (NEURONTIN) 100 MG capsule Take 1 capsule (100 mg total) by mouth at bedtime. 09/15/17   Loletha Grayer, MD  lisinopril (PRINIVIL,ZESTRIL) 5 MG tablet Take 1 tablet (5 mg total) by mouth daily. Patient not taking: Reported on 12/19/2017 10/03/17   Minna Merritts, MD  magnesium oxide (MAG-OX) 400 (241.3 Mg) MG tablet Take 400 mg by mouth daily.    [provider]  metFORMIN (GLUCOPHAGE) 500 MG tablet Take 500 mg by mouth 2 (two) times daily with a meal.     [provider]  metoprolol tartrate (LOPRESSOR) 50 MG tablet Take 50 mg by mouth 2 (two) times daily.    [provider]  rosuvastatin (CRESTOR) 20 MG tablet Take 20 mg by mouth daily.    [provider]      PHYSICAL EXAMINATION:   VITAL SIGNS: Blood pressure (!) 94/41, pulse (!) 42, temperature 97.6 F (36.4 C), temperature source Oral, resp. rate 10, height 5\' 8"  (1.727 m), weight 67.5 kg, SpO2 98 %.  GENERAL:  77 y.o.-year-old patient lying in the bed chronically ill-appearing EYES: Pupils equal, round, reactive to light and accommodation. No scleral icterus. Extraocular muscles intact.  HEENT: Head atraumatic, normocephalic. Oropharynx and nasopharynx clear.  NECK:  Supple, no jugular venous distention. No thyroid enlargement, no tenderness.  LUNGS: Normal breath sounds bilaterally, no wheezing, rales,rhonchi or crepitation. No use of accessory muscles of respiration.  CARDIOVASCULAR: S1, S2 normal. No  murmurs, rubs, or gallops.  ABDOMEN: Soft, nontender, nondistended. Bowel sounds present. No organomegaly or mass.  EXTREMITIES: No pedal edema, cyanosis, or clubbing.  NEUROLOGIC: Lethargic, not able to follow commands PSYCHIATRIC: Patient is lethargic SKIN: No obvious rash, lesion, or ulcer.   LABORATORY PANEL:   CBC Recent Labs  Lab 01/13/18 1339  WBC 15.6*  HGB 10.3*  HCT 30.8*  PLT 389  MCV 94.2  MCH 31.4  MCHC 33.4  RDW 16.0*  LYMPHSABS 1.3  MONOABS 0.6  EOSABS 0.0  BASOSABS 0.0   ------------------------------------------------------------------------------------------------------------------  Chemistries  Recent Labs  Lab 01/13/18 1339  NA 136  K 5.4*  CL 105  CO2 20*  GLUCOSE 97  BUN 89*  CREATININE 3.86*  CALCIUM 7.8*  AST 364*  ALT 256*  ALKPHOS 317*  BILITOT 0.7   ------------------------------------------------------------------------------------------------------------------ estimated creatinine clearance is 15.3 mL/min (A) (by C-G formula based on SCr of 3.86 mg/dL (H)). ------------------------------------------------------------------------------------------------------------------ No results for input(s): TSH, T4TOTAL, T3FREE, THYROIDAB in the last 72 hours.  Invalid input(s): FREET3   Coagulation profile No results for input(s): INR, PROTIME in the last 168 hours. ------------------------------------------------------------------------------------------------------------------- No results for input(s): DDIMER in the last 72 hours. -------------------------------------------------------------------------------------------------------------------  Cardiac Enzymes Recent Labs  Lab 01/13/18 1339  TROPONINI 0.04*   ------------------------------------------------------------------------------------------------------------------ Invalid input(s):  POCBNP  ---------------------------------------------------------------------------------------------------------------  Urinalysis    Component Value Date/Time   COLORURINE YELLOW (A) 01/13/2018 1339   APPEARANCEUR CLOUDY (A) 01/13/2018 1339   LABSPEC 1.014 01/13/2018 1339   PHURINE 5.0 01/13/2018 1339   GLUCOSEU NEGATIVE 01/13/2018 1339   HGBUR MODERATE (A) 01/13/2018 1339   BILIRUBINUR NEGATIVE 01/13/2018 1339   KETONESUR NEGATIVE 01/13/2018 1339   PROTEINUR 30 (A) 01/13/2018 1339   NITRITE POSITIVE (A) 01/13/2018 1339   LEUKOCYTESUR LARGE (A) 01/13/2018 1339     RADIOLOGY: Ct Head Wo Contrast  Result Date: 01/13/2018 CLINICAL DATA:  Altered level consciousness beginning this morning. History of hypertension, hyperlipidemia, recurrent prostate cancer. EXAM: CT HEAD WITHOUT CONTRAST TECHNIQUE: Contiguous axial images were obtained from the base of the skull through the vertex without intravenous contrast. COMPARISON:  None. FINDINGS: BRAIN: No intraparenchymal hemorrhage, mass effect nor midline shift. The ventricles and sulci are normal for age. Patchy to confluent supratentorial white matter hypodensities. Small area of focal blurring RIGHT posterior frontal gray-white matter junction (series 5, image 14). No abnormal extra-axial fluid collections. Basal cisterns are patent. VASCULAR: Moderate calcific atherosclerosis of the carotid siphons. SKULL: No skull fracture. No significant scalp soft tissue swelling. SINUSES/ORBITS: Paranasal sinuses are well aerated. Mastoid air cells are well aerated.The included ocular globes and orbital contents are non-suspicious. OTHER: None. IMPRESSION: 1. 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 2. Moderate to severe chronic small vessel ischemic changes. Electronically Signed   By: Elon Alas M.D.   On: 01/13/2018 14:36   Dg Abdomen Acute W/chest  Result Date: 01/13/2018 CLINICAL DATA:  Abdominal pain with altered mental  status EXAM: DG ABDOMEN ACUTE W/ 1V CHEST COMPARISON:  Chest radiograph Sep 11, 2017; chest CT November 15, 2017 FINDINGS: PA chest: Lungs are clear. Heart size and pulmonary vascularity are normal. No adenopathy. There is aortic atherosclerosis. There are surgical clips in the upper abdomen. Supine and upright abdomen: There is fairly diffuse stool throughout the colon. There is no bowel dilatation or air-fluid level to suggest bowel obstruction. No free air. There is postoperative change in the lower lumbar region. There are multiple surgical clips in the upper abdomen. There are also surgical clips in the pelvis. IMPRESSION: Multiple areas of postoperative change in the abdomen pelvis. Diffuse stool throughout much of the colon. No evident bowel obstruction or free air. No lung edema or consolidation. There is aortic atherosclerosis. Aortic Atherosclerosis (ICD10-I70.0). Electronically Signed   By: Lowella Grip III M.D.   On: 01/13/2018 14:27    EKG: Orders placed or performed during the hospital encounter of 01/13/18  . ED EKG  . ED EKG  . EKG 12-Lead  . EKG 12-Lead    IMPRESSION AND PLAN: Patient 77 year old nursing home due to altered mental status  1.  Acute encephalopathy due to sepsis We will treat underlying cause follow mental status  2.  Sepsis due to UTI we will treat with broad-spectrum  antibiotics due to severity of his hypotension and and other organ failures  3.  Acute renal failure likely due to ATN we will give IV fluids follow renal function Check bladder CAT scan Nephrology consult  4.  Elevated LFTs likely due to shock liver hold any nephrotoxins if no improvement then will need GI evaluation  5.  Chronic systolic CHF blood pressure low hold all medications that could lower blood pressure including diuretic  6.  Diabetes type 2 we will place on sliding scale insulin  7.  Hypertension currently hypotensive hold blood pressure meds  8.  Miscellaneous patient on  Eliquis which we will continue will need dose adjustment for his renal failure     All the records are reviewed and case discussed with ED provider. Management plans discussed with the patient, family and they are in agreement.  CODE STATUS: Code Status History    Date Active Date Inactive Code Status Order ID Comments User Context   12/19/2017 1932 12/20/2017 2151 DNR [id number]  [name], MD Inpatient   11/17/2017 1357 11/19/2017 1603 DNR [id number]  Hermelinda Dellen, DO Inpatient   11/15/2017 1550 11/17/2017 1357 Full Code [id number]  Loletha Grayer, MD ED   09/11/2017 1951 09/15/2017 1603 Full Code [id number]  Bettey Costa, MD ED   05/11/2015 1307 05/13/2015 1256 Full Code [id number]  Aldean Jewett, MD Inpatient   04/25/2015 0149 04/27/2015 1741 Full Code [id number]  Hillary Bow, MD ED    Questions for Most Recent Historical Code Status (Order [id number])    Question Answer Comment   In the event of cardiac or respiratory ARREST Do not call a "code blue"    In the event of cardiac or respiratory ARREST Do not perform Intubation, CPR, defibrillation or ACLS    In the event of cardiac or respiratory ARREST Use medication by any route, position, wound care, and other measures to relive pain and suffering. May use oxygen, suction and manual treatment of airway obstruction as needed for comfort.    Comments RN may pronounce        TOTAL TIME TAKING CARE OF THIS PATIENT:55 minutes.    Dustin Flock M.D on 01/13/2018 at 2:58 PM  Between 7am to 6pm - Pager - [phone]  After 6pm go to [email or web address] - password [email or web address] [facility]  [phone]  CC: Primary care physician; [name], MD

## 2018-01-13 NOTE — ED Notes (Signed)
ccu unable to take report.  

## 2018-01-14 ENCOUNTER — Inpatient Hospital Stay: Payer: Medicare Other

## 2018-01-14 LAB — COMPREHENSIVE METABOLIC PANEL
ALBUMIN: 2.5 g/dL — AB (ref 3.5–5.0)
ALK PHOS: 296 U/L — AB (ref 38–126)
ALT: 225 U/L — ABNORMAL HIGH (ref 0–44)
ANION GAP: 12 (ref 5–15)
AST: 188 U/L — ABNORMAL HIGH (ref 15–41)
BUN: 85 mg/dL — ABNORMAL HIGH (ref 8–23)
CO2: 19 mmol/L — ABNORMAL LOW (ref 22–32)
Calcium: 7.5 mg/dL — ABNORMAL LOW (ref 8.9–10.3)
Chloride: 105 mmol/L (ref 98–111)
Creatinine, Ser: 3.75 mg/dL — ABNORMAL HIGH (ref 0.61–1.24)
GFR calc Af Amer: 16 mL/min — ABNORMAL LOW (ref >60)
GFR calc non Af Amer: 14 mL/min — ABNORMAL LOW (ref >60)
GLUCOSE: 165 mg/dL — AB (ref 70–99)
POTASSIUM: 5.8 mmol/L — AB (ref 3.5–5.1)
Sodium: 136 mmol/L (ref 135–145)
Total Bilirubin: 0.6 mg/dL (ref 0.3–1.2)
Total Protein: 6.2 g/dL — ABNORMAL LOW (ref 6.5–8.1)

## 2018-01-14 LAB — TROPONIN I: TROPONIN I: 0.05 ng/mL — AB (ref <0.03)

## 2018-01-14 LAB — CBC WITH DIFFERENTIAL/PLATELET
BASOS PCT: 0 %
Basophils Absolute: 0 10*3/uL (ref 0–0.1)
Eosinophils Absolute: 0 10*3/uL (ref 0–0.7)
Eosinophils Relative: 0 %
HEMATOCRIT: 30.4 % — AB (ref 40.0–52.0)
HEMOGLOBIN: 10.3 g/dL — AB (ref 13.0–18.0)
Lymphocytes Relative: 7 %
Lymphs Abs: 1.4 10*3/uL (ref 1.0–3.6)
MCH: 31.7 pg (ref 26.0–34.0)
MCHC: 34 g/dL (ref 32.0–36.0)
MCV: 93.2 fL (ref 80.0–100.0)
MONO ABS: 0.8 10*3/uL (ref 0.2–1.0)
MONOS PCT: 4 %
NEUTROS ABS: 16.5 10*3/uL — AB (ref 1.4–6.5)
NEUTROS PCT: 89 %
Platelets: 368 10*3/uL (ref 150–440)
RBC: 3.26 MIL/uL — ABNORMAL LOW (ref 4.40–5.90)
RDW: 15.6 % — ABNORMAL HIGH (ref 11.5–14.5)
WBC: 18.6 10*3/uL — ABNORMAL HIGH (ref 3.8–10.6)

## 2018-01-14 LAB — GLUCOSE, CAPILLARY
GLUCOSE-CAPILLARY: 153 mg/dL — AB (ref 70–99)
GLUCOSE-CAPILLARY: 111 mg/dL — AB (ref 70–99)
GLUCOSE-CAPILLARY: 147 mg/dL — AB (ref 70–99)
Glucose-Capillary: 141 mg/dL — ABNORMAL HIGH (ref 70–99)
Glucose-Capillary: 155 mg/dL — ABNORMAL HIGH (ref 70–99)

## 2018-01-14 LAB — POTASSIUM: Potassium: 5.3 mmol/L — ABNORMAL HIGH (ref 3.5–5.1)

## 2018-01-14 LAB — MAGNESIUM: Magnesium: 1.9 mg/dL (ref 1.7–2.4)

## 2018-01-14 LAB — T4, FREE: Free T4: 0.7 ng/dL — ABNORMAL LOW (ref 0.82–1.77)

## 2018-01-14 LAB — LIPASE, BLOOD: Lipase: 33 U/L (ref 11–51)

## 2018-01-14 LAB — LACTIC ACID, PLASMA: Lactic Acid, Venous: 2.1 mmol/L (ref 0.5–1.9)

## 2018-01-14 LAB — AMMONIA: Ammonia: 29 umol/L (ref 9–35)

## 2018-01-14 LAB — PHOSPHORUS: Phosphorus: 6.5 mg/dL — ABNORMAL HIGH (ref 2.5–4.6)

## 2018-01-14 MED ORDER — OLANZAPINE 2.5 MG PO TABS
2.5000 mg | ORAL_TABLET | Freq: Every day | ORAL | Status: DC
  Administered 2018-01-14 – 2018-01-30 (×14): 2.5 mg via ORAL
  Filled 2018-01-14 (×18): qty 1

## 2018-01-14 MED ORDER — SODIUM CHLORIDE 0.9 % IV SOLN
1.0000 g | Freq: Once | INTRAVENOUS | Status: AC
  Administered 2018-01-14: 1 g via INTRAVENOUS
  Filled 2018-01-14: qty 10

## 2018-01-14 MED ORDER — VANCOMYCIN HCL IN DEXTROSE 750-5 MG/150ML-% IV SOLN: 750.0000 mg | INTRAVENOUS | Status: DC

## 2018-01-14 MED ORDER — MAGNESIUM SULFATE IN D5W 1-5 GM/100ML-% IV SOLN
1.0000 g | Freq: Once | INTRAVENOUS | Status: AC
  Administered 2018-01-14: 1 g via INTRAVENOUS
  Filled 2018-01-14: qty 100

## 2018-01-14 MED ORDER — ROSUVASTATIN CALCIUM 10 MG PO TABS: 20.0000 mg | ORAL_TABLET | Freq: Every day | ORAL | Status: DC

## 2018-01-14 MED ORDER — DEXTROSE 50 % IV SOLN
1.0000 | Freq: Once | INTRAVENOUS | Status: AC
  Administered 2018-01-14: 50 mL via INTRAVENOUS
  Filled 2018-01-14: qty 50

## 2018-01-14 MED ORDER — INSULIN ASPART 100 UNIT/ML IV SOLN
10.0000 [IU] | Freq: Once | INTRAVENOUS | Status: AC
  Administered 2018-01-14: 10 [IU] via INTRAVENOUS
  Filled 2018-01-14: qty 0.1

## 2018-01-14 MED ORDER — SODIUM POLYSTYRENE SULFONATE 15 GM/60ML PO SUSP
15.0000 g | Freq: Once | ORAL | Status: AC
  Administered 2018-01-14: 15 g via ORAL
  Filled 2018-01-14: qty 60

## 2018-01-14 MED ORDER — LEVOTHYROXINE SODIUM 100 MCG IV SOLR
12.5000 ug | Freq: Every day | INTRAVENOUS | Status: DC
  Administered 2018-01-14 – 2018-01-16 (×3): 12.5 ug via INTRAVENOUS
  Filled 2018-01-14 (×4): qty 5

## 2018-01-14 MED ORDER — VANCOMYCIN HCL IN DEXTROSE 1-5 GM/200ML-% IV SOLN
1000.0000 mg | INTRAVENOUS | Status: DC
  Filled 2018-01-14: qty 200

## 2018-01-14 MED ORDER — LACTATED RINGERS IV BOLUS
500.0000 mL | Freq: Once | INTRAVENOUS | Status: AC
  Administered 2018-01-14: 500 mL via INTRAVENOUS

## 2018-01-14 MED ORDER — SODIUM BICARBONATE 8.4 % IV SOLN
50.0000 meq | Freq: Once | INTRAVENOUS | Status: AC
  Administered 2018-01-14: 50 meq via INTRAVENOUS
  Filled 2018-01-14: qty 50

## 2018-01-14 MED ORDER — APIXABAN 5 MG PO TABS
5.0000 mg | ORAL_TABLET | Freq: Two times a day (BID) | ORAL | Status: DC
  Administered 2018-01-14 – 2018-01-15 (×2): 5 mg via ORAL
  Filled 2018-01-14 (×2): qty 1

## 2018-01-14 NOTE — Progress Notes (Signed)
[name], M.D Pulmonary Critical Care & Sleep Medicine   ICU Patient Progress Note  Name: Gregory Tyler  DOB: 1941-02-18  MRN: [id number]  Date: 01/14/2018   [x] I have reviewed the flowsheet and previous day's notes.  IMPRESSION:   Altered mental status: Most likely related to sepsis, patient is currently alert awake oriented x3 able to move all 4 extremities, head CT is okay  Severe sepsis: Possible urinary source, panculture sent, started on Vanco and Zosyn  Septic shock: Received 3 L of fluid, as patient is slightly bradycardic will continue with dopamine, taper as tolerated, continue with LR   Renal failure: Most likely due to severe sepsis/septic shock and dehydration, renal consult is done, avoid nephrotoxic medication, IV fluids, monitor, Foley catheter was placed last night, renal ultrasound is done pending results  Lactic acidosis: Most likely due to sepsis/septic shock, patient was on metformin at home, improvement to 2.1  Atrial fibrillation: Hold metoprolol, continue with Eliquis- adjusted Eliquis dose to 5 mg twice daily  Diabetes: Sliding scale coverage  Hypothyroidism: Explains lots of symptoms, start Synthroid 12.5 IV daily  Nausea: Most likely related to sepsis if continue to have issues will consider abdominal CT-follow-up abdominal sono and determine further course of action  Abnormal LFTs: Most likely due to shock and hypotension, will consider right upper quadrant sono  PLAN:  CVS: Hypotension probably due to sepsis, last echo done in May 2019 showed preserved EF, serial cardiac enzymes, mild leak of troponin most likely due to sepsis, continue Eliquis for A. fib and monitor, keep her dopamine as tolerated RS: On room air doing well, some history suggest that he has history of emphysema use bronchodilators as needed currently not showing any signs of cough or wheezing. -- Aspiration precaution ID: Panculture, Vanco plus Zosyn, renal ultrasound and right  upper quadrant sono,-lipase is okay -- Follow culture and adjust ENDO: Low T4 with high TSH-bradycardia-hypertension we will start IV Synthroid and monitor, hold metformin -- SSI monitor blood sugar GI: Monitor LFTs, right upper quadrant sono, if ultrasound is okay start diet RENAL: Renal consult appreciated, avoid nephrotoxic medication, IV fluids -- Electrolyte replacement protocol, Monitor Cr and K -Potassium was 5.4 and received treatment in the morning -Magnesium and potassium was replaced CNS: AMS improved, head CT okay HEMATOLOGY: Monitor H&H -- Monitor HB and PLT MUSCULOSKELETAL: No issues PAIN AND SEDATION: PRN meds  Skin/Wound: Sacral decubitus, heel decubitus might be a source of sepsis also  Electrolytes: Replace electrolytes per ICU electrolyte replacement protocol.   IVF: none  Nutrition: Diet as tolerated  Prophylaxis: DVT Prophylaxis with Eliquis,. GI Prophylaxis.   Restraints: None  PT/OT eval and treat. OOB when appropriate.   Lines/Tubes: Not able to place Foley left subclavian 01/13/2018 central line. ADVANCE DIRECTIVE: DNR/DNI FAMILY DISCUSSION: Spoke with the son at length Quality Care: PPI, DVT prophylaxis, HOB elevated, Infection control all reviewed and addressed. Events and notes from last 24 hours reviewed. Care plan discussed on multidisciplinary rounds CC TIME: 42 minutes excluding procedures.   HPI: Gregory Tyler  is a 77 y.o. male with a known history of chronic systolic CHF, diabetes type 2, paroxysmal atrial fibrillation, coronary artery disease, gastric ulcer, hypertension, hyperlipidemia who currently resides in a skilled nursing facility brought from there for altered mental status.  Patient son states that his father was in usual state of health yesterday however today he was confused and lethargic.  Patient noted to be hypotensive, noted to have acute renal failure, noted to  have elevated liver function test.  Also noticed to have  UTI. Patient was transferred to ICU as patient required low-dose dopamine - Already been seen by renal consult - Left subclavian central line was placed in the ICU  Subjective: Doing well no significant events overnight, potassium was 5.8 treated now it is 5.3, minimal urine output, 2700+, blood sugar is 155 diabetic at home, mild improvement in creatinine to 3.75 TSH was 10, free T4 0.70 -Magnesium 1.9 replaced, calcium 5.8 replaced  Significant event: 01/13/2018: Admission, subclavian line placement, dopamine  [] The patient is critically ill on     [] Mechanical ventilation [] Pressors  [] BiPAP []    MEDICATIONS:   allopurinol 300 mg Daily  apixaban 5 mg Q12H  insulin aspart 0-9 Units TID WC  levothyroxine 12.5 mcg Daily  rosuvastatin 20 mg Daily    Vital Signs:   BP (!) 97/58   Pulse 69   Temp 98 F (36.7 C)   Resp (!) 6   Ht 5' 8"  (1.727 m)   Wt 67.5 kg   SpO2 100%   BMI 22.63 kg/m       Prehospital Care Cardiac Rhythm: Sinus bradycardia, Bundle branch block  Temp (24hrs), Avg:97.4 F (36.3 C), Min:96.8 F (36 C), Max:98 F (36.7 C)   Intake/Output:Last shift:      No intake/output data recorded.Last 3 shifts: 09/22 1901 - 09/24 0700 In: 3062.8 [I.V.:2973.5] Out: 320 [Urine:320]  Intake/Output Summary (Last 24 hours) at 01/14/2018 1124 Last data filed at 01/14/2018 0600 Gross per 24 hour  Intake 3062.77 ml  Output 320 ml  Net 2742.77 ml    Objective:   Physical Exam:  General: comfortable, no acute distress HEENT: pupils reactive, sclera anicteric, EOM intact Neck: No adenopathy or thyroid swelling, no lymphadenopathy or JVD, supple CVS: S1S2 no murmurs RS: Mod AE bilaterally, Abd: soft, non tender, no hepatosplenomegaly Neuro: non focal, awake, alert Extrm: Heal dressings sacral decub    CBC w/Diff Recent Labs  Lab 01/13/18 1339 01/14/18 0349  HGB 10.3* 10.3*  HCT 30.8* 30.4*  WBC 15.6* 18.6*  PLT 389 368     Chemistry Recent Labs  Lab  01/13/18 1339 01/13/18 1951 01/14/18 0349 01/14/18 0917  NA 136 135 136  --   K 5.4* 5.5* 5.8* 5.3*  CL 105 108 105  --   CO2 20* 18* 19*  --   BUN 89* 84* 85*  --   CREATININE 3.86* 3.68* 3.75*  --   GLUCOSE 97 91 165*  --     Hepatic Function Latest Ref Rng & Units 01/14/2018 01/13/2018 01/13/2018  Total Protein 6.5 - 8.1 g/dL 6.2(L) 5.8(L) 6.1(L)  Albumin 3.5 - 5.0 g/dL 2.5(L) 2.5(L) 2.6(L)  AST 15 - 41 U/L 188(H) 275(H) 364(H)  ALT 0 - 44 U/L 225(H) 239(H) 256(H)  Alk Phosphatase 38 - 126 U/L 296(H) 287(H) 317(H)  Total Bilirubin 0.3 - 1.2 mg/dL 0.6 0.8 0.7     Lactic Acid    Component Value Date/Time   LATICACIDVEN 2.1 (HH) 01/14/2018 0349     Micro  @MICRO @ Recent Results (from the past 720 hour(s))  Blood culture (routine x 2)     Status: None (Preliminary result)   Collection Time: 01/13/18  1:39 PM  Result Value Ref Range Status   Specimen Description RIGHT ANTECUBITAL  Final   Special Requests   Final    BOTTLES DRAWN AEROBIC AND ANAEROBIC Blood Culture adequate volume   Culture   Final    NO  GROWTH < 24 HOURS Performed at [facility], [address] [place], [place] [address]    Report Status PENDING  Incomplete  Blood culture (routine x 2)     Status: None (Preliminary result)   Collection Time: 01/13/18  1:39 PM  Result Value Ref Range Status   Specimen Description LEFT ANTECUBITAL  Final   Special Requests   Final    BOTTLES DRAWN AEROBIC AND ANAEROBIC Blood Culture results may not be optimal due to an excessive volume of blood received in culture bottles   Culture   Final    NO GROWTH < 24 HOURS Performed at [facility], [address], [place], [place] [address]    Report Status PENDING  Incomplete  MRSA PCR Screening     Status: None   Collection Time: 01/13/18  5:13 PM  Result Value Ref Range Status   MRSA by PCR NEGATIVE NEGATIVE Final    Comment:        The GeneXpert MRSA Assay (FDA approved for NASAL specimens only),  is one component of a comprehensive MRSA colonization surveillance program. It is not intended to diagnose MRSA infection nor to guide or monitor treatment for MRSA infections. Performed at [facility], [address] [place], [place] [address]      ABG No results found for: PHART, PCO2ART, PO2ART, HCO3, TCO2, ACIDBASEDEF, O2SAT   Cardiac Enzymes Recent Labs    01/13/18 1339 01/13/18 1951 01/14/18 0349  TROPONINI 0.04* 0.04* 0.05*      Coagulation Lab Results  Component Value Date   INR 1.75 11/19/2017     Radiology Results Reviewed by me and compared with previous imaging studies Ct Head Wo Contrast  Result Date: 01/13/2018 CLINICAL DATA:  Altered level consciousness beginning this morning. History of hypertension, hyperlipidemia, recurrent prostate cancer. EXAM: CT HEAD WITHOUT CONTRAST TECHNIQUE: Contiguous axial images were obtained from the base of the skull through the vertex without intravenous contrast. COMPARISON:  None. FINDINGS: BRAIN: No intraparenchymal hemorrhage, mass effect nor midline shift. The ventricles and sulci are normal for age. Patchy to confluent supratentorial white matter hypodensities. Small area of focal blurring RIGHT posterior frontal gray-white matter junction (series 5, image 14). No abnormal extra-axial fluid collections. Basal cisterns are patent. VASCULAR: Moderate calcific atherosclerosis of the carotid siphons. SKULL: No skull fracture. No significant scalp soft tissue swelling. SINUSES/ORBITS: Paranasal sinuses are well aerated. Mastoid air cells are well aerated.The included ocular globes and orbital contents are non-suspicious. OTHER: None. IMPRESSION: 1. 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 2. Moderate to severe chronic small vessel ischemic changes. Electronically Signed   By: Elon Alas M.D.   On: 01/13/2018 14:36   US Renal  Result Date: 01/14/2018 CLINICAL DATA:  77 year old male. Renal failure.  Initial encounter. EXAM: RENAL / URINARY TRACT ULTRASOUND COMPLETE COMPARISON:  11/15/2017 CT. FINDINGS: Right Kidney: Length: 10 cm. Echogenicity within normal limits. No mass or hydronephrosis visualized. Left Kidney: Length: 9.1 cm. Echogenicity within normal limits. No hydronephrosis. Nonobstructing renal calculi measure up to 5 mm. Evaluation limited although no mass identified. Bladder: Decompressed by Foley catheter IMPRESSION: 1. No hydronephrosis. 2. Nonobstructing left renal calculi measuring up to 5 mm. Electronically Signed   By: Genia Del M.D.   On: 01/14/2018 10:27   Dg Chest Port 1 View  Result Date: 01/13/2018 CLINICAL DATA:  Central line placement EXAM: PORTABLE CHEST 1 VIEW COMPARISON:  Chest x-ray from earlier same day. FINDINGS: LEFT subclavian central line in place  with tip positioned at the level of the mid/upper SVC. Heart size and mediastinal contours are within normal limits. Lungs are clear. No pleural effusion or pneumothorax seen. IMPRESSION: LEFT subclavian central line placement with tip positioned at the level of the mid/upper SVC. No pneumothorax seen. Electronically Signed   By: Franki Cabot M.D.   On: 01/13/2018 18:42   Dg Abdomen Acute W/chest  Result Date: 01/13/2018 CLINICAL DATA:  Abdominal pain with altered mental status EXAM: DG ABDOMEN ACUTE W/ 1V CHEST COMPARISON:  Chest radiograph Sep 11, 2017; chest CT November 15, 2017 FINDINGS: PA chest: Lungs are clear. Heart size and pulmonary vascularity are normal. No adenopathy. There is aortic atherosclerosis. There are surgical clips in the upper abdomen. Supine and upright abdomen: There is fairly diffuse stool throughout the colon. There is no bowel dilatation or air-fluid level to suggest bowel obstruction. No free air. There is postoperative change in the lower lumbar region. There are multiple surgical clips in the upper abdomen. There are also surgical clips in the pelvis. IMPRESSION: Multiple areas of postoperative  change in the abdomen pelvis. Diffuse stool throughout much of the colon. No evident bowel obstruction or free air. No lung edema or consolidation. There is aortic atherosclerosis. Aortic Atherosclerosis (ICD10-I70.0). Electronically Signed   By: Lowella Grip III M.D.   On: 01/13/2018 14:27   US Abdomen Limited Ruq  Result Date: 01/14/2018 CLINICAL DATA:  77 year old male with abnormal liver function studies. Initial encounter. EXAM: ULTRASOUND ABDOMEN LIMITED RIGHT UPPER QUADRANT COMPARISON:  11/15/2017 CT. FINDINGS: Gallbladder: Prior cholecystectomy. Common bile duct: Diameter: 4 mm Liver: Liver of slight increased echogenicity consistent with fatty infiltration and/or hepatocellular disease. No focal hepatic lesion noted. Portal vein is patent on color Doppler imaging with normal direction of blood flow towards the liver. IMPRESSION: 1. Post cholecystectomy. 2. Common bile duct of normal size. 3. Liver of slight increased echogenicity possibly related to result of fatty infiltration or hepatocellular disease. No focal hepatic lesion noted. Portal vein patent. Electronically Signed   By: Genia Del M.D.   On: 01/14/2018 10:30      ECHO Study Conclusions  - Left ventricle: The cavity size was normal. Systolic function was   normal. The estimated ejection fraction was in the range of 55%   to 60%. Wall motion was normal; there were no regional wall   motion abnormalities. Features are consistent with a pseudonormal   left ventricular filling pattern, with concomitant abnormal   relaxation and increased filling pressure (grade 2 diastolic   dysfunction). - Aortic valve: Peak velocity (S): 192 cm/s. Mean gradient (S): 8   mm Hg. - Mitral valve: There was mild regurgitation. - Left atrium: The atrium was mildly dilated. - Right ventricle: Systolic function was normal. - Pulmonary arteries: Systolic pressure was mildly elevated. PA   peak pressure: 35 mm Hg (S).   01/14/2018   The  patient is: [x]  acutely ill Risk of deterioration: []  moderate   [x]  critically ill  []  high   [x] See my orders for details  My assessment, plan of care, findings, medications, side effects etc were discussed with: [x] nursing [] PT/OT   [x] respiratory therapy [] Dr.  [] family []    Alric Ran, M.D. Pulmonary Critical Care & Sleep Medicine

## 2018-01-14 NOTE — Progress Notes (Signed)
[place] [facility]                                                                                                                                                                                  Patient Demographics   Gregory Tyler, is a 77 y.o. male, DOB - September 27, 1940, ZOX:[id number]  Admit date - 01/13/2018   Admitting Physician Dustin Flock, MD  Outpatient Primary MD for the patient is Sparks, Leonie Douglas, MD   LOS - 1  Subjective:  He is somewhat better continues to be on dopamine    Review of Systems:   CONSTITUTIONAL: No documented fever. No fatigue, weakness. No weight gain, no weight loss.  EYES: No blurry or double vision.  ENT: No tinnitus. No postnasal drip. No redness of the oropharynx.  RESPIRATORY: No cough, no wheeze, no hemoptysis. No dyspnea.  CARDIOVASCULAR: No chest pain. No orthopnea. No palpitations. No syncope.  GASTROINTESTINAL: No nausea, no vomiting or diarrhea. No abdominal pain. No melena or hematochezia.  GENITOURINARY: No dysuria or hematuria.  ENDOCRINE: No polyuria or nocturia. No heat or cold intolerance.  HEMATOLOGY: No anemia. No bruising. No bleeding.  INTEGUMENTARY: No rashes. No lesions.  MUSCULOSKELETAL: No arthritis. No swelling. No gout.  Complains of foot pain NEUROLOGIC: No numbness, tingling, or ataxia. No seizure-type activity.  PSYCHIATRIC: No anxiety. No insomnia. No ADD.    Vitals:   Vitals:   01/14/18 0400 01/14/18 0500 01/14/18 0600 01/14/18 0700  BP: (!) 202/185 (!) 120/57 (!) 120/57 (!) 97/58  Pulse: 61 60 63 69  Resp: 20 (!) 4 (!) 6 (!) 6  Temp:    98 F (36.7 C)  TempSrc:      SpO2: 98% 96% 97% 100%  Weight:      Height:        Wt Readings from Last 3 Encounters:  01/13/18 67.5 kg  01/08/18 67.6 kg  12/19/17 68.8 kg     Intake/Output Summary (Last 24 hours) at 01/14/2018 1139 Last data filed at 01/14/2018 0600 Gross per 24 hour  Intake 3062.77 ml  Output 320 ml  Net 2742.77  ml    Physical Exam:   GENERAL: Pleasant-appearing in no apparent distress.  HEAD, EYES, EARS, NOSE AND THROAT: Atraumatic, normocephalic. Extraocular muscles are intact. Pupils equal and reactive to light. Sclerae anicteric. No conjunctival injection. No oro-pharyngeal erythema.  NECK: Supple. There is no jugular venous distention. No bruits, no lymphadenopathy, no thyromegaly.  HEART: Regular rate and rhythm,. No murmurs, no rubs, no clicks.  LUNGS: Clear to auscultation bilaterally. No rales or rhonchi. No wheezes.  ABDOMEN: Soft, flat, nontender, nondistended. Has good bowel sounds. No hepatosplenomegaly  appreciated.  EXTREMITIES: No evidence of any cyanosis, clubbing, or peripheral edema.  +2 pedal and radial pulses bilaterally.  NEUROLOGIC: The patient is alert, awake, and oriented x3 with no focal motor or sensory deficits appreciated bilaterally.  SKIN: Moist and warm with no rashes appreciated.  Psych: Not anxious, depressed LN: No inguinal LN enlargement    Antibiotics   Anti-infectives (From admission, onward)   Start     Dose/Rate Route Frequency Ordered Stop   01/15/18 1200  vancomycin (VANCOCIN) IVPB 1000 mg/200 mL premix     1,000 mg 200 mL/hr over 60 Minutes Intravenous Every 48 hours 01/14/18 1135     01/15/18 1000  vancomycin (VANCOCIN) IVPB 750 mg/150 ml premix  Status:  Discontinued     750 mg 150 mL/hr over 60 Minutes Intravenous Every 48 hours 01/14/18 0839 01/14/18 1135   01/14/18 1000  vancomycin (VANCOCIN) IVPB 750 mg/150 ml premix  Status:  Discontinued     750 mg 150 mL/hr over 60 Minutes Intravenous Every 48 hours 01/13/18 1652 01/14/18 0839   01/14/18 0600  vancomycin (VANCOCIN) IVPB 750 mg/150 ml premix  Status:  Discontinued     750 mg 150 mL/hr over 60 Minutes Intravenous Every 48 hours 01/13/18 1548 01/13/18 1652   01/13/18 2200  piperacillin-tazobactam (ZOSYN) IVPB 3.375 g     3.375 g 12.5 mL/hr over 240 Minutes Intravenous Every 12 hours 01/13/18  1548     01/13/18 1445  vancomycin (VANCOCIN) IVPB 1000 mg/200 mL premix     1,000 mg 200 mL/hr over 60 Minutes Intravenous  Once 01/13/18 1443 01/13/18 1624   01/13/18 1445  piperacillin-tazobactam (ZOSYN) IVPB 3.375 g     3.375 g 100 mL/hr over 30 Minutes Intravenous  Once 01/13/18 1443 01/13/18 1619      Medications   Scheduled Meds: . allopurinol  300 mg Oral Daily  . apixaban  5 mg Oral Q12H  . insulin aspart  0-9 Units Subcutaneous TID WC  . levothyroxine  12.5 mcg Intravenous Daily  . rosuvastatin  20 mg Oral Daily   Continuous Infusions: . calcium gluconate    . DOPamine 10 mcg/kg/min (01/14/18 0600)  . lactated ringers 100 mL/hr at 01/14/18 0600  . magnesium sulfate 1 - 4 g bolus IVPB    . piperacillin-tazobactam (ZOSYN)  IV 3.375 g (01/14/18 0913)  . [START ON 01/15/2018] vancomycin     PRN Meds:.acetaminophen **OR** acetaminophen, ondansetron **OR** ondansetron (ZOFRAN) IV   Data Review:   Micro Results Recent Results (from the past 240 hour(s))  Blood culture (routine x 2)     Status: None (Preliminary result)   Collection Time: 01/13/18  1:39 PM  Result Value Ref Range Status   Specimen Description RIGHT ANTECUBITAL  Final   Special Requests   Final    BOTTLES DRAWN AEROBIC AND ANAEROBIC Blood Culture adequate volume   Culture   Final    NO GROWTH < 24 HOURS Performed at [facility], [address] [place], [place] [address]    Report Status PENDING  Incomplete  Blood culture (routine x 2)     Status: None (Preliminary result)   Collection Time: 01/13/18  1:39 PM  Result Value Ref Range Status   Specimen Description LEFT ANTECUBITAL  Final   Special Requests   Final    BOTTLES DRAWN AEROBIC AND ANAEROBIC Blood Culture results may not be optimal due to an excessive volume of blood received in culture bottles   Culture   Final  NO GROWTH < 24 HOURS Performed at [facility], [address] [place], [place] [address]    Report  Status PENDING  Incomplete  MRSA PCR Screening     Status: None   Collection Time: 01/13/18  5:13 PM  Result Value Ref Range Status   MRSA by PCR NEGATIVE NEGATIVE Final    Comment:        The GeneXpert MRSA Assay (FDA approved for NASAL specimens only), is one component of a comprehensive MRSA colonization surveillance program. It is not intended to diagnose MRSA infection nor to guide or monitor treatment for MRSA infections. Performed at [facility], [address], [place], [place] [address]     Radiology Reports Ct Head Wo Contrast  Result Date: 01/13/2018 CLINICAL DATA:  Altered level consciousness beginning this morning. History of hypertension, hyperlipidemia, recurrent prostate cancer. EXAM: CT HEAD WITHOUT CONTRAST TECHNIQUE: Contiguous axial images were obtained from the base of the skull through the vertex without intravenous contrast. COMPARISON:  None. FINDINGS: BRAIN: No intraparenchymal hemorrhage, mass effect nor midline shift. The ventricles and sulci are normal for age. Patchy to confluent supratentorial white matter hypodensities. Small area of focal blurring RIGHT posterior frontal gray-white matter junction (series 5, image 14). No abnormal extra-axial fluid collections. Basal cisterns are patent. VASCULAR: Moderate calcific atherosclerosis of the carotid siphons. SKULL: No skull fracture. No significant scalp soft tissue swelling. SINUSES/ORBITS: Paranasal sinuses are well aerated. Mastoid air cells are well aerated.The included ocular globes and orbital contents are non-suspicious. OTHER: None. IMPRESSION: 1. 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 2. Moderate to severe chronic small vessel ischemic changes. Electronically Signed   By: Elon Alas M.D.   On: 01/13/2018 14:36   US Renal  Result Date: 01/14/2018 CLINICAL DATA:  77 year old male. Renal failure. Initial encounter. EXAM: RENAL / URINARY TRACT ULTRASOUND COMPLETE  COMPARISON:  11/15/2017 CT. FINDINGS: Right Kidney: Length: 10 cm. Echogenicity within normal limits. No mass or hydronephrosis visualized. Left Kidney: Length: 9.1 cm. Echogenicity within normal limits. No hydronephrosis. Nonobstructing renal calculi measure up to 5 mm. Evaluation limited although no mass identified. Bladder: Decompressed by Foley catheter IMPRESSION: 1. No hydronephrosis. 2. Nonobstructing left renal calculi measuring up to 5 mm. Electronically Signed   By: Genia Del M.D.   On: 01/14/2018 10:27   Dg Chest Port 1 View  Result Date: 01/13/2018 CLINICAL DATA:  Central line placement EXAM: PORTABLE CHEST 1 VIEW COMPARISON:  Chest x-ray from earlier same day. FINDINGS: LEFT subclavian central line in place with tip positioned at the level of the mid/upper SVC. Heart size and mediastinal contours are within normal limits. Lungs are clear. No pleural effusion or pneumothorax seen. IMPRESSION: LEFT subclavian central line placement with tip positioned at the level of the mid/upper SVC. No pneumothorax seen. Electronically Signed   By: Franki Cabot M.D.   On: 01/13/2018 18:42   Dg Abdomen Acute W/chest  Result Date: 01/13/2018 CLINICAL DATA:  Abdominal pain with altered mental status EXAM: DG ABDOMEN ACUTE W/ 1V CHEST COMPARISON:  Chest radiograph Sep 11, 2017; chest CT November 15, 2017 FINDINGS: PA chest: Lungs are clear. Heart size and pulmonary vascularity are normal. No adenopathy. There is aortic atherosclerosis. There are surgical clips in the upper abdomen. Supine and upright abdomen: There is fairly diffuse stool throughout the colon. There is no bowel dilatation or air-fluid level to suggest bowel obstruction. No free air. There is postoperative change in the lower lumbar region. There are multiple  surgical clips in the upper abdomen. There are also surgical clips in the pelvis. IMPRESSION: Multiple areas of postoperative change in the abdomen pelvis. Diffuse stool throughout much of the  colon. No evident bowel obstruction or free air. No lung edema or consolidation. There is aortic atherosclerosis. Aortic Atherosclerosis (ICD10-I70.0). Electronically Signed   By: Lowella Grip III M.D.   On: 01/13/2018 14:27   US Abdomen Limited Ruq  Result Date: 01/14/2018 CLINICAL DATA:  77 year old male with abnormal liver function studies. Initial encounter. EXAM: ULTRASOUND ABDOMEN LIMITED RIGHT UPPER QUADRANT COMPARISON:  11/15/2017 CT. FINDINGS: Gallbladder: Prior cholecystectomy. Common bile duct: Diameter: 4 mm Liver: Liver of slight increased echogenicity consistent with fatty infiltration and/or hepatocellular disease. No focal hepatic lesion noted. Portal vein is patent on color Doppler imaging with normal direction of blood flow towards the liver. IMPRESSION: 1. Post cholecystectomy. 2. Common bile duct of normal size. 3. Liver of slight increased echogenicity possibly related to result of fatty infiltration or hepatocellular disease. No focal hepatic lesion noted. Portal vein patent. Electronically Signed   By: Genia Del M.D.   On: 01/14/2018 10:30     CBC Recent Labs  Lab 01/13/18 1339 01/14/18 0349  WBC 15.6* 18.6*  HGB 10.3* 10.3*  HCT 30.8* 30.4*  PLT 389 368  MCV 94.2 93.2  MCH 31.4 31.7  MCHC 33.4 34.0  RDW 16.0* 15.6*  LYMPHSABS 1.3 1.4  MONOABS 0.6 0.8  EOSABS 0.0 0.0  BASOSABS 0.0 0.0    Chemistries  Recent Labs  Lab 01/13/18 1339 01/13/18 1951 01/14/18 0349 01/14/18 0917  NA 136 135 136  --   K 5.4* 5.5* 5.8* 5.3*  CL 105 108 105  --   CO2 20* 18* 19*  --   GLUCOSE 97 91 165*  --   BUN 89* 84* 85*  --   CREATININE 3.86* 3.68* 3.75*  --   CALCIUM 7.8* 7.3* 7.5*  --   MG  --   --  1.9  --   AST 364* 275* 188*  --   ALT 256* 239* 225*  --   ALKPHOS 317* 287* 296*  --   BILITOT 0.7 0.8 0.6  --    ------------------------------------------------------------------------------------------------------------------ estimated creatinine clearance  is 15.5 mL/min (A) (by C-G formula based on SCr of 3.75 mg/dL (H)). ------------------------------------------------------------------------------------------------------------------ No results for input(s): HGBA1C in the last 72 hours. ------------------------------------------------------------------------------------------------------------------ No results for input(s): CHOL, HDL, LDLCALC, TRIG, CHOLHDL, LDLDIRECT in the last 72 hours. ------------------------------------------------------------------------------------------------------------------ Recent Labs    01/13/18 1951  TSH 10.319*   ------------------------------------------------------------------------------------------------------------------ No results for input(s): VITAMINB12, FOLATE, FERRITIN, TIBC, IRON, RETICCTPCT in the last 72 hours.  Coagulation profile No results for input(s): INR, PROTIME in the last 168 hours.  No results for input(s): DDIMER in the last 72 hours.  Cardiac Enzymes Recent Labs  Lab 01/13/18 1339 01/13/18 1951 01/14/18 0349  TROPONINI 0.04* 0.04* 0.05*   ------------------------------------------------------------------------------------------------------------------ Invalid input(s): POCBNP    Assessment & Plan   IMPRESSION AND PLAN: Patient 77 year old nursing home due to altered mental status  1.  Acute encephalopathy due to sepsis 2 status improved  2.  Sepsis due to UTI continue broad-spectrum antibiotics as well as pressor  3.  Acute renal failure likely due to ATN we will give IV fluids follow renal function Nephrology consult appreciated  4.  Elevated LFTs likely due to shock liver liver function improved continue supportive care  5.  Chronic systolic CHF blood pressure low hold all medications that could lower  blood pressure including diuretic  6.  Diabetes type 2 continue sliding-scale insulin7.  Hypertension currently hypotensive hold blood pressure meds  8.   Miscellaneous patient on Eliquis which we will continue      Code Status Orders  (From admission, onward)         Start     Ordered   01/13/18 1713  Do not attempt resuscitation (DNR)  Continuous    Question Answer Comment  In the event of cardiac or respiratory ARREST Do not call a "code blue"   In the event of cardiac or respiratory ARREST Do not perform Intubation, CPR, defibrillation or ACLS   In the event of cardiac or respiratory ARREST Use medication by any route, position, wound care, and other measures to relive pain and suffering. May use oxygen, suction and manual treatment of airway obstruction as needed for comfort.   Comments RN may pronounce      01/13/18 1712        Code Status History    Date Active Date Inactive Code Status Order ID Comments User Context   12/19/2017 1932 12/20/2017 2151 DNR [id number]  [name], MD Inpatient   11/17/2017 1357 11/19/2017 1603 DNR [id number]  Hermelinda Dellen, DO Inpatient   11/15/2017 1550 11/17/2017 1357 Full Code [id number]  Loletha Grayer, MD ED   09/11/2017 1951 09/15/2017 1603 Full Code [id number]  Bettey Costa, MD ED   05/11/2015 1307 05/13/2015 1256 Full Code [id number]  Aldean Jewett, MD Inpatient   04/25/2015 0149 04/27/2015 1741 Full Code [id number]  Hillary Bow, MD ED           Consults pulmonary critical care, nephrology  DVT Prophylaxis Eliquis  Lab Results  Component Value Date   PLT 368 01/14/2018     Time Spent in minutes   58min  Greater than 50% of time spent in care coordination and counseling patient regarding the condition and plan of care.   Dustin Flock M.D on 01/14/2018 at 11:39 AM  Between 7am to 6pm - Pager - [phone]  After 6pm go to [email or web address] - Proofreader  [employer]   Office  [phone]

## 2018-01-14 NOTE — Consult Note (Signed)
Pharmacy Antibiotic Note  Gregory Tyler is a 77 y.o. male admitted on 01/13/2018 with sepsis with suspected urinary source. Pharmacy has been consulted for Vancomycin and Zosyn Dosing.   Scr 3.86 on admission (baseline around 1.8). Renal function has not improved today.  Plan: Per CCM on 9/24 rounds, will continue vancomycin and Zosyn at this time until cultures result.   Continue vancomycin 1 g q48h with next dose tomorrow at 1200. Goal trough 15-20 mcg/mL. Will reassess renal function tomorrow to adjust dose as needed.  Continue Zosyn 3.375 g extended infusion q12h   Height: 5\' 8"  (172.7 cm) Weight: 148 lb 13 oz (67.5 kg) IBW/kg (Calculated) : 68.4  Temp (24hrs), Avg:97.5 F (36.4 C), Min:96.8 F (36 C), Max:98 F (36.7 C)  Recent Labs  Lab 01/13/18 1339 01/13/18 1709 01/13/18 1951 01/14/18 0349  WBC 15.6*  --   --  18.6*  CREATININE 3.86*  --  3.68* 3.75*  LATICACIDVEN 2.8* 3.2*  --  2.1*    Estimated Creatinine Clearance: 15.5 mL/min (A) (by C-G formula based on SCr of 3.75 mg/dL (H)).    Allergies  Allergen Reactions  . Aspirin     GI Bleeding  . Codeine Other (See Comments)    BLISTERS Other reaction(s): Other (See Comments) BLISTERS   . Codeine Sulfate Other (See Comments)    Other reaction(s): Other (See Comments) BLISTERS BLISTERS    Antimicrobials this admission: Vancomycin 9/23 >>  Zosyn 9/23 >>   Microbiology results: 9/23 BCx: NG 24 hours 9/23 Urine Cx: pending 9/23 MRSA PCR: negative  Thank you for allowing pharmacy to be a part of this patient's care.  Tawnya Crook, PharmD Pharmacy Resident  01/14/2018 2:12 PM

## 2018-01-14 NOTE — Progress Notes (Signed)
[facility], [place] 01/14/18  Subjective:   Patient continues to be critically ill S Creatinine slightly worse at 3.75 Potassium elevated at 5.8   Objective:  Vital signs in last 24 hours:  Temp:  [96.8 F (36 C)-98 F (36.7 C)] 98 F (36.7 C) (09/24 0700) Pulse Rate:  [42-69] 69 (09/24 0700) Resp:  [4-20] 6 (09/24 0700) BP: (70-202)/(40-185) 97/58 (09/24 0700) SpO2:  [93 %-100 %] 100 % (09/24 0700) Weight:  [67.5 kg] 67.5 kg (09/23 1327)  Weight change:  Filed Weights   01/13/18 1327  Weight: 67.5 kg    Intake/Output:    Intake/Output Summary (Last 24 hours) at 01/14/2018 1114 Last data filed at 01/14/2018 0600 Gross per 24 hour  Intake 3062.77 ml  Output 320 ml  Net 2742.77 ml     Physical Exam: General:  Chronically ill-appearing, laying in the bed, thin cachectic  HEENT  dry oral mucous membranes  Neck  supple  Pulm/lungs  decreased breath sounds at bases  CVS/Heart  irregular, tachycardic  Abdomen:   Soft, nondistended, dry heaving  Extremities:  + dependent peripheral edema, bilateral feet in bandages  Neurologic:  Alert, able to answer few questions,  Skin:  Warm          Basic Metabolic Panel:  Recent Labs  Lab 01/13/18 1339 01/13/18 1951 01/14/18 0349 01/14/18 0917  NA 136 135 136  --   K 5.4* 5.5* 5.8* 5.3*  CL 105 108 105  --   CO2 20* 18* 19*  --   GLUCOSE 97 91 165*  --   BUN 89* 84* 85*  --   CREATININE 3.86* 3.68* 3.75*  --   CALCIUM 7.8* 7.3* 7.5*  --   MG  --   --  1.9  --   PHOS  --   --  6.5*  --      CBC: Recent Labs  Lab 01/13/18 1339 01/14/18 0349  WBC 15.6* 18.6*  NEUTROABS 13.7* 16.5*  HGB 10.3* 10.3*  HCT 30.8* 30.4*  MCV 94.2 93.2  PLT 389 368     No results found for: HEPBSAG, HEPBSAB, HEPBIGM    Microbiology:  Recent Results (from the past 240 hour(s))  Blood culture (routine x 2)     Status: None (Preliminary result)   Collection Time: 01/13/18  1:39 PM  Result  Value Ref Range Status   Specimen Description RIGHT ANTECUBITAL  Final   Special Requests   Final    BOTTLES DRAWN AEROBIC AND ANAEROBIC Blood Culture adequate volume   Culture   Final    NO GROWTH < 24 HOURS Performed at [facility], [address] [place], [place] [address]    Report Status PENDING  Incomplete  Blood culture (routine x 2)     Status: None (Preliminary result)   Collection Time: 01/13/18  1:39 PM  Result Value Ref Range Status   Specimen Description LEFT ANTECUBITAL  Final   Special Requests   Final    BOTTLES DRAWN AEROBIC AND ANAEROBIC Blood Culture results may not be optimal due to an excessive volume of blood received in culture bottles   Culture   Final    NO GROWTH < 24 HOURS Performed at [facility], [address], [place],  [address]    Report Status PENDING  Incomplete  MRSA PCR Screening     Status: None   Collection Time: 01/13/18  5:13 PM  Result Value Ref Range Status  MRSA by PCR NEGATIVE NEGATIVE Final    Comment:        The GeneXpert MRSA Assay (FDA approved for NASAL specimens only), is one component of a comprehensive MRSA colonization surveillance program. It is not intended to diagnose MRSA infection nor to guide or monitor treatment for MRSA infections. Performed at [facility], [address] [place], [place] [address]     Coagulation Studies: No results for input(s): LABPROT, INR in the last 72 hours.  Urinalysis: Recent Labs    01/13/18 1339  COLORURINE YELLOW*  LABSPEC 1.014  PHURINE 5.0  GLUCOSEU NEGATIVE  HGBUR MODERATE*  BILIRUBINUR NEGATIVE  KETONESUR NEGATIVE  PROTEINUR 30*  NITRITE POSITIVE*  LEUKOCYTESUR LARGE*      Imaging: Ct Head Wo Contrast  Result Date: 01/13/2018 CLINICAL DATA:  Altered level consciousness beginning this morning. History of hypertension, hyperlipidemia, recurrent prostate cancer. EXAM: CT HEAD WITHOUT CONTRAST TECHNIQUE: Contiguous axial  images were obtained from the base of the skull through the vertex without intravenous contrast. COMPARISON:  None. FINDINGS: BRAIN: No intraparenchymal hemorrhage, mass effect nor midline shift. The ventricles and sulci are normal for age. Patchy to confluent supratentorial white matter hypodensities. Small area of focal blurring RIGHT posterior frontal gray-white matter junction (series 5, image 14). No abnormal extra-axial fluid collections. Basal cisterns are patent. VASCULAR: Moderate calcific atherosclerosis of the carotid siphons. SKULL: No skull fracture. No significant scalp soft tissue swelling. SINUSES/ORBITS: Paranasal sinuses are well aerated. Mastoid air cells are well aerated.The included ocular globes and orbital contents are non-suspicious. OTHER: None. IMPRESSION: 1. 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 2. Moderate to severe chronic small vessel ischemic changes. Electronically Signed   By: Elon Alas M.D.   On: 01/13/2018 14:36   US Renal  Result Date: 01/14/2018 CLINICAL DATA:  77 year old male. Renal failure. Initial encounter. EXAM: RENAL / URINARY TRACT ULTRASOUND COMPLETE COMPARISON:  11/15/2017 CT. FINDINGS: Right Kidney: Length: 10 cm. Echogenicity within normal limits. No mass or hydronephrosis visualized. Left Kidney: Length: 9.1 cm. Echogenicity within normal limits. No hydronephrosis. Nonobstructing renal calculi measure up to 5 mm. Evaluation limited although no mass identified. Bladder: Decompressed by Foley catheter IMPRESSION: 1. No hydronephrosis. 2. Nonobstructing left renal calculi measuring up to 5 mm. Electronically Signed   By: Genia Del M.D.   On: 01/14/2018 10:27   Dg Chest Port 1 View  Result Date: 01/13/2018 CLINICAL DATA:  Central line placement EXAM: PORTABLE CHEST 1 VIEW COMPARISON:  Chest x-ray from earlier same day. FINDINGS: LEFT subclavian central line in place with tip positioned at the level of the mid/upper SVC. Heart  size and mediastinal contours are within normal limits. Lungs are clear. No pleural effusion or pneumothorax seen. IMPRESSION: LEFT subclavian central line placement with tip positioned at the level of the mid/upper SVC. No pneumothorax seen. Electronically Signed   By: Franki Cabot M.D.   On: 01/13/2018 18:42   Dg Abdomen Acute W/chest  Result Date: 01/13/2018 CLINICAL DATA:  Abdominal pain with altered mental status EXAM: DG ABDOMEN ACUTE W/ 1V CHEST COMPARISON:  Chest radiograph Sep 11, 2017; chest CT November 15, 2017 FINDINGS: PA chest: Lungs are clear. Heart size and pulmonary vascularity are normal. No adenopathy. There is aortic atherosclerosis. There are surgical clips in the upper abdomen. Supine and upright abdomen: There is fairly diffuse stool throughout the colon. There is no bowel dilatation or air-fluid level to suggest bowel obstruction. No free air. There is postoperative change in the lower  lumbar region. There are multiple surgical clips in the upper abdomen. There are also surgical clips in the pelvis. IMPRESSION: Multiple areas of postoperative change in the abdomen pelvis. Diffuse stool throughout much of the colon. No evident bowel obstruction or free air. No lung edema or consolidation. There is aortic atherosclerosis. Aortic Atherosclerosis (ICD10-I70.0). Electronically Signed   By: Lowella Grip III M.D.   On: 01/13/2018 14:27   US Abdomen Limited Ruq  Result Date: 01/14/2018 CLINICAL DATA:  77 year old male with abnormal liver function studies. Initial encounter. EXAM: ULTRASOUND ABDOMEN LIMITED RIGHT UPPER QUADRANT COMPARISON:  11/15/2017 CT. FINDINGS: Gallbladder: Prior cholecystectomy. Common bile duct: Diameter: 4 mm Liver: Liver of slight increased echogenicity consistent with fatty infiltration and/or hepatocellular disease. No focal hepatic lesion noted. Portal vein is patent on color Doppler imaging with normal direction of blood flow towards the liver. IMPRESSION: 1.  Post cholecystectomy. 2. Common bile duct of normal size. 3. Liver of slight increased echogenicity possibly related to result of fatty infiltration or hepatocellular disease. No focal hepatic lesion noted. Portal vein patent. Electronically Signed   By: Genia Del M.D.   On: 01/14/2018 10:30     Medications:   . DOPamine 10 mcg/kg/min (01/14/18 0600)  . lactated ringers 100 mL/hr at 01/14/18 0600  . piperacillin-tazobactam (ZOSYN)  IV 3.375 g (01/14/18 0913)  . [START ON 01/15/2018] vancomycin     . allopurinol  300 mg Oral Daily  . apixaban  5 mg Oral Q12H  . insulin aspart  0-9 Units Subcutaneous TID WC  . levothyroxine  12.5 mcg Intravenous Daily  . rosuvastatin  20 mg Oral Daily   acetaminophen **OR** acetaminophen, ondansetron **OR** ondansetron (ZOFRAN) IV  Assessment/ Plan:  77 y.o. Caucasian male with medical problems of chronic systolic congestive heart failure, diabetes type 2, atrial fibrillation, hypertension, history of gastric ulcer, coronary disease, who was admitted to [facility] on 01/13/2018 for evaluation of altered mental status.   1.  Acute renal failure on chronic kidney disease stage III Baseline creatinine 1.30/GFR 51 from December 20, 2017 Admission creatinine 3.86.   The cause of acute renal failure appears to be multifactorial including hypotension, sepsis, likely leading to ATN Agree with volume resuscitation as per ICU team Renal u/s shows left non obstructing stone 5 mm; no hydronephrosis No acute indication for dialysis at present. Discussed with patient's son that he may need temporary HD if no improvement in renal function.  2.  UTI and sepsis Patient is currently getting broad-spectrum antibiotics. Please dose medications for creatinine clearance less than 30  3. Hyperkalemia - treated with shifting measures- SPS - may use veltassa if repeat level remains high - Discontinued Lactated ringer due to potassium content     LOS: 1 Daylan Juhnke  Candiss Norse 9/24/201911:14 AM  [place], [place]  Note: This note was prepared with Dragon dictation. Any transcription errors are unintentional

## 2018-01-15 ENCOUNTER — Inpatient Hospital Stay: Payer: Medicare Other

## 2018-01-15 ENCOUNTER — Inpatient Hospital Stay (HOSPITAL_COMMUNITY)
Admit: 2018-01-15 | Discharge: 2018-01-15 | Disposition: A | Discharge status: Home / Self Care | Payer: Medicare Other | Attending: Pulmonary Disease | Admitting: Pulmonary Disease

## 2018-01-15 DIAGNOSIS — I340 Nonrheumatic mitral (valve) insufficiency: Secondary | ICD-10-CM

## 2018-01-15 LAB — HEPATIC FUNCTION PANEL
ALT: 152 U/L — AB (ref 0–44)
AST: 94 U/L — AB (ref 15–41)
Albumin: 2.3 g/dL — ABNORMAL LOW (ref 3.5–5.0)
Alkaline Phosphatase: 234 U/L — ABNORMAL HIGH (ref 38–126)
BILIRUBIN DIRECT: 0.2 mg/dL (ref 0.0–0.2)
Indirect Bilirubin: 0.4 mg/dL (ref 0.3–0.9)
Total Bilirubin: 0.6 mg/dL (ref 0.3–1.2)
Total Protein: 5.4 g/dL — ABNORMAL LOW (ref 6.5–8.1)

## 2018-01-15 LAB — PHOSPHORUS: Phosphorus: 6.2 mg/dL — ABNORMAL HIGH (ref 2.5–4.6)

## 2018-01-15 LAB — GLUCOSE, CAPILLARY
GLUCOSE-CAPILLARY: 191 mg/dL — AB (ref 70–99)
Glucose-Capillary: 115 mg/dL — ABNORMAL HIGH (ref 70–99)
Glucose-Capillary: 134 mg/dL — ABNORMAL HIGH (ref 70–99)
Glucose-Capillary: 204 mg/dL — ABNORMAL HIGH (ref 70–99)
Glucose-Capillary: 221 mg/dL — ABNORMAL HIGH (ref 70–99)

## 2018-01-15 LAB — CBC WITH DIFFERENTIAL/PLATELET
Basophils Absolute: 0 10*3/uL (ref 0–0.1)
Basophils Relative: 0 %
Eosinophils Absolute: 0 10*3/uL (ref 0–0.7)
Eosinophils Relative: 0 %
HEMATOCRIT: 27.4 % — AB (ref 40.0–52.0)
Hemoglobin: 9.5 g/dL — ABNORMAL LOW (ref 13.0–18.0)
Lymphocytes Relative: 13 %
Lymphs Abs: 1.7 10*3/uL (ref 1.0–3.6)
MCH: 32 pg (ref 26.0–34.0)
MCHC: 34.5 g/dL (ref 32.0–36.0)
MCV: 92.7 fL (ref 80.0–100.0)
MONO ABS: 0.9 10*3/uL (ref 0.2–1.0)
MONOS PCT: 7 %
NEUTROS ABS: 10.3 10*3/uL — AB (ref 1.4–6.5)
Neutrophils Relative %: 80 %
Platelets: 294 10*3/uL (ref 150–440)
RBC: 2.96 MIL/uL — ABNORMAL LOW (ref 4.40–5.90)
RDW: 15.7 % — AB (ref 11.5–14.5)
WBC: 12.9 10*3/uL — ABNORMAL HIGH (ref 3.8–10.6)

## 2018-01-15 LAB — BASIC METABOLIC PANEL
ANION GAP: 9 (ref 5–15)
BUN: 87 mg/dL — ABNORMAL HIGH (ref 8–23)
CALCIUM: 7.1 mg/dL — AB (ref 8.9–10.3)
CO2: 21 mmol/L — AB (ref 22–32)
Chloride: 107 mmol/L (ref 98–111)
Creatinine, Ser: 4.23 mg/dL — ABNORMAL HIGH (ref 0.61–1.24)
GFR calc Af Amer: 14 mL/min — ABNORMAL LOW (ref >60)
GFR calc non Af Amer: 12 mL/min — ABNORMAL LOW (ref >60)
GLUCOSE: 134 mg/dL — AB (ref 70–99)
POTASSIUM: 4.9 mmol/L (ref 3.5–5.1)
Sodium: 137 mmol/L (ref 135–145)

## 2018-01-15 LAB — ECHOCARDIOGRAM COMPLETE
Height: 68 in
Weight: 2380.97 oz

## 2018-01-15 LAB — MAGNESIUM: Magnesium: 1.9 mg/dL (ref 1.7–2.4)

## 2018-01-15 LAB — CK: Total CK: 54 U/L (ref 49–397)

## 2018-01-15 MED ORDER — SODIUM CHLORIDE 0.9 % IV SOLN
INTRAVENOUS | Status: DC
  Administered 2018-01-16: 05:00:00 via INTRAVENOUS

## 2018-01-15 MED ORDER — APIXABAN 2.5 MG PO TABS
2.5000 mg | ORAL_TABLET | Freq: Two times a day (BID) | ORAL | Status: DC
  Administered 2018-01-15 – 2018-01-17 (×4): 2.5 mg via ORAL
  Filled 2018-01-15 (×4): qty 1

## 2018-01-15 MED ORDER — HYDROCODONE-ACETAMINOPHEN 5-325 MG PO TABS: 1.0000 | ORAL_TABLET | ORAL | Status: DC | PRN

## 2018-01-15 MED ORDER — SENNOSIDES-DOCUSATE SODIUM 8.6-50 MG PO TABS
2.0000 | ORAL_TABLET | Freq: Two times a day (BID) | ORAL | Status: DC
  Administered 2018-01-15 – 2018-01-26 (×9): 2 via ORAL
  Filled 2018-01-15 (×15): qty 2

## 2018-01-15 MED ORDER — POLYETHYLENE GLYCOL 3350 17 G PO PACK: 17.0000 g | PACK | Freq: Every day | ORAL | Status: DC | PRN

## 2018-01-15 MED ORDER — LACTATED RINGERS IV BOLUS
500.0000 mL | Freq: Once | INTRAVENOUS | Status: AC
  Administered 2018-01-15: 500 mL via INTRAVENOUS

## 2018-01-15 NOTE — Progress Notes (Signed)
[facility], [place] 01/15/18  Subjective:   Patient continues to be critically ill S Creatinine slightly worse at 4.23, but urine output improved to 1050 cc Patient reports poor appetite Potassium improved today to 4.9   Objective:  Vital signs in last 24 hours:  Temp:  [97.3 F (36.3 C)-98.6 F (37 C)] 98.6 F (37 C) (09/25 0700) Pulse Rate:  [56-115] 75 (09/25 1000) Resp:  [0-21] 12 (09/25 1000) BP: (85-116)/(47-75) 101/61 (09/25 1000) SpO2:  [93 %-100 %] 98 % (09/25 1000)  Weight change:  Filed Weights   01/13/18 1327  Weight: 67.5 kg    Intake/Output:    Intake/Output Summary (Last 24 hours) at 01/15/2018 1056 Last data filed at 01/15/2018 1027 Gross per 24 hour  Intake 1514.74 ml  Output 975 ml  Net 539.74 ml     Physical Exam: General:  Chronically ill-appearing, laying in the bed, thin cachectic  HEENT  dry oral mucous membranes  Neck  supple  Pulm/lungs  decreased breath sounds at bases  CVS/Heart  irregular, tachycardic  Abdomen:   Soft, nondistended,   Extremities:  + dependent peripheral edema, bilateral feet in bandages  Neurologic:  Alert, able to answer few questions,    Basic Metabolic Panel:  Recent Labs  Lab 01/13/18 1339 01/13/18 1951 01/14/18 0349 01/14/18 0917 01/15/18 0520  NA 136 135 136  --  137  K 5.4* 5.5* 5.8* 5.3* 4.9  CL 105 108 105  --  107  CO2 20* 18* 19*  --  21*  GLUCOSE 97 91 165*  --  134*  BUN 89* 84* 85*  --  87*  CREATININE 3.86* 3.68* 3.75*  --  4.23*  CALCIUM 7.8* 7.3* 7.5*  --  7.1*  MG  --   --  1.9  --  1.9  PHOS  --   --  6.5*  --  6.2*     CBC: Recent Labs  Lab 01/13/18 1339 01/14/18 0349 01/15/18 0520  WBC 15.6* 18.6* 12.9*  NEUTROABS 13.7* 16.5* 10.3*  HGB 10.3* 10.3* 9.5*  HCT 30.8* 30.4* 27.4*  MCV 94.2 93.2 92.7  PLT 389 368 294     No results found for: HEPBSAG, HEPBSAB, HEPBIGM    Microbiology:  Recent Results (from the past 240 hour(s))  Blood  culture (routine x 2)     Status: None (Preliminary result)   Collection Time: 01/13/18  1:39 PM  Result Value Ref Range Status   Specimen Description RIGHT ANTECUBITAL  Final   Special Requests   Final    BOTTLES DRAWN AEROBIC AND ANAEROBIC Blood Culture adequate volume   Culture   Final    NO GROWTH 2 DAYS Performed at [facility], [address] [place], [place] [address]    Report Status PENDING  Incomplete  Blood culture (routine x 2)     Status: None (Preliminary result)   Collection Time: 01/13/18  1:39 PM  Result Value Ref Range Status   Specimen Description LEFT ANTECUBITAL  Final   Special Requests   Final    BOTTLES DRAWN AEROBIC AND ANAEROBIC Blood Culture results may not be optimal due to an excessive volume of blood received in culture bottles   Culture   Final    NO GROWTH 2 DAYS Performed at [facility], [address] [place], [place] [address]    Report Status PENDING  Incomplete  Urine Culture     Status: Abnormal (Preliminary result)   Collection  Time: 01/13/18  1:39 PM  Result Value Ref Range Status   Specimen Description   Final    URINE, RANDOM Performed at [facility], [address], [place], [place] [address]    Special Requests   Final    NONE Performed at [facility], [address] [place], [place] [address]    Culture (A)  Final    >=100,000 COLONIES/mL PSEUDOMONAS AERUGINOSA SUSCEPTIBILITIES TO FOLLOW Performed at [place] [address] [place], [place] [address]    Report Status PENDING  Incomplete  MRSA PCR Screening     Status: None   Collection Time: 01/13/18  5:13 PM  Result Value Ref Range Status   MRSA by PCR NEGATIVE NEGATIVE Final    Comment:        The GeneXpert MRSA Assay (FDA approved for NASAL specimens only), is one component of a comprehensive MRSA colonization surveillance program. It is not intended to diagnose MRSA infection nor to guide or monitor treatment  for MRSA infections. Performed at [facility], [address] [place], [place] [address]     Coagulation Studies: No results for input(s): LABPROT, INR in the last 72 hours.  Urinalysis: Recent Labs    01/13/18 1339  COLORURINE YELLOW*  LABSPEC 1.014  PHURINE 5.0  GLUCOSEU NEGATIVE  HGBUR MODERATE*  BILIRUBINUR NEGATIVE  KETONESUR NEGATIVE  PROTEINUR 30*  NITRITE POSITIVE*  LEUKOCYTESUR LARGE*      Imaging: Ct Head Wo Contrast  Result Date: 01/13/2018 CLINICAL DATA:  Altered level consciousness beginning this morning. History of hypertension, hyperlipidemia, recurrent prostate cancer. EXAM: CT HEAD WITHOUT CONTRAST TECHNIQUE: Contiguous axial images were obtained from the base of the skull through the vertex without intravenous contrast. COMPARISON:  None. FINDINGS: BRAIN: No intraparenchymal hemorrhage, mass effect nor midline shift. The ventricles and sulci are normal for age. Patchy to confluent supratentorial white matter hypodensities. Small area of focal blurring RIGHT posterior frontal gray-white matter junction (series 5, image 14). No abnormal extra-axial fluid collections. Basal cisterns are patent. VASCULAR: Moderate calcific atherosclerosis of the carotid siphons. SKULL: No skull fracture. No significant scalp soft tissue swelling. SINUSES/ORBITS: Paranasal sinuses are well aerated. Mastoid air cells are well aerated.The included ocular globes and orbital contents are non-suspicious. OTHER: None. IMPRESSION: 1. 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 2. Moderate to severe chronic small vessel ischemic changes. Electronically Signed   By: Elon Alas M.D.   On: 01/13/2018 14:36   US Renal  Result Date: 01/14/2018 CLINICAL DATA:  77 year old male. Renal failure. Initial encounter. EXAM: RENAL / URINARY TRACT ULTRASOUND COMPLETE COMPARISON:  11/15/2017 CT. FINDINGS: Right Kidney: Length: 10 cm. Echogenicity within normal limits. No  mass or hydronephrosis visualized. Left Kidney: Length: 9.1 cm. Echogenicity within normal limits. No hydronephrosis. Nonobstructing renal calculi measure up to 5 mm. Evaluation limited although no mass identified. Bladder: Decompressed by Foley catheter IMPRESSION: 1. No hydronephrosis. 2. Nonobstructing left renal calculi measuring up to 5 mm. Electronically Signed   By: Genia Del M.D.   On: 01/14/2018 10:27   Dg Chest Port 1 View  Result Date: 01/15/2018 CLINICAL DATA:  Shortness of breath EXAM: PORTABLE CHEST 1 VIEW COMPARISON:  January 13, 2018 FINDINGS: The heart size and mediastinal contours are stable. Left central venous line is unchanged. Both lungs are clear. The visualized skeletal structures are stable. IMPRESSION: No active cardiopulmonary disease. Electronically Signed   By: Abelardo Diesel M.D.   On: 01/15/2018 10:45   Dg  Chest Port 1 View  Result Date: 01/13/2018 CLINICAL DATA:  Central line placement EXAM: PORTABLE CHEST 1 VIEW COMPARISON:  Chest x-ray from earlier same day. FINDINGS: LEFT subclavian central line in place with tip positioned at the level of the mid/upper SVC. Heart size and mediastinal contours are within normal limits. Lungs are clear. No pleural effusion or pneumothorax seen. IMPRESSION: LEFT subclavian central line placement with tip positioned at the level of the mid/upper SVC. No pneumothorax seen. Electronically Signed   By: Franki Cabot M.D.   On: 01/13/2018 18:42   Dg Abdomen Acute W/chest  Result Date: 01/13/2018 CLINICAL DATA:  Abdominal pain with altered mental status EXAM: DG ABDOMEN ACUTE W/ 1V CHEST COMPARISON:  Chest radiograph Sep 11, 2017; chest CT November 15, 2017 FINDINGS: PA chest: Lungs are clear. Heart size and pulmonary vascularity are normal. No adenopathy. There is aortic atherosclerosis. There are surgical clips in the upper abdomen. Supine and upright abdomen: There is fairly diffuse stool throughout the colon. There is no bowel dilatation  or air-fluid level to suggest bowel obstruction. No free air. There is postoperative change in the lower lumbar region. There are multiple surgical clips in the upper abdomen. There are also surgical clips in the pelvis. IMPRESSION: Multiple areas of postoperative change in the abdomen pelvis. Diffuse stool throughout much of the colon. No evident bowel obstruction or free air. No lung edema or consolidation. There is aortic atherosclerosis. Aortic Atherosclerosis (ICD10-I70.0). Electronically Signed   By: Lowella Grip III M.D.   On: 01/13/2018 14:27   US Abdomen Limited Ruq  Result Date: 01/14/2018 CLINICAL DATA:  77 year old male with abnormal liver function studies. Initial encounter. EXAM: ULTRASOUND ABDOMEN LIMITED RIGHT UPPER QUADRANT COMPARISON:  11/15/2017 CT. FINDINGS: Gallbladder: Prior cholecystectomy. Common bile duct: Diameter: 4 mm Liver: Liver of slight increased echogenicity consistent with fatty infiltration and/or hepatocellular disease. No focal hepatic lesion noted. Portal vein is patent on color Doppler imaging with normal direction of blood flow towards the liver. IMPRESSION: 1. Post cholecystectomy. 2. Common bile duct of normal size. 3. Liver of slight increased echogenicity possibly related to result of fatty infiltration or hepatocellular disease. No focal hepatic lesion noted. Portal vein patent. Electronically Signed   By: Genia Del M.D.   On: 01/14/2018 10:30     Medications:   . DOPamine 7 mcg/kg/min (01/15/18 0600)  . piperacillin-tazobactam (ZOSYN)  IV 3.375 g (01/15/18 0900)   . allopurinol  300 mg Oral Daily  . apixaban  2.5 mg Oral Q12H  . insulin aspart  0-9 Units Subcutaneous TID WC  . levothyroxine  12.5 mcg Intravenous Daily  . OLANZapine  2.5 mg Oral QHS  . [START ON 01/17/2018] rosuvastatin  20 mg Oral Daily  . senna-docusate  2 tablet Oral BID   acetaminophen **OR** acetaminophen, ondansetron **OR** ondansetron (ZOFRAN) IV, polyethylene  glycol  Assessment/ Plan:  77 y.o. Caucasian male with medical problems of chronic systolic congestive heart failure, diabetes type 2, atrial fibrillation, hypertension, history of gastric ulcer, coronary disease, who was admitted to [facility] on 01/13/2018 for evaluation of altered mental status.   1.  Acute renal failure on chronic kidney disease stage III Baseline creatinine 1.30/GFR 51 from December 20, 2017 Admission creatinine 3.86, worse today to 4.23 The cause of acute renal failure appears to be multifactorial including hypotension, sepsis, likely leading to ATN Renal u/s shows left non obstructing stone 5 mm; no hydronephrosis No acute indication for dialysis at present. Discussed with patient's son that  he may need temporary HD if no improvement in renal function although with his fragile health, he may not be able to tolerate it  2.  UTI and sepsis, Pseudomonas aeuroginosa Patient is currently getting broad-spectrum antibiotics.   3. Hyperkalemia - treated with shifting measures- SPS - may use veltassa if repeat level remains high - Discontinued Lactated ringer due to potassium content     LOS: 2 Sheri Prows 9/25/201910:56 AM  [place], [place]  Note: This note was prepared with Dragon dictation. Any transcription errors are unintentional

## 2018-01-15 NOTE — Consult Note (Signed)
Pharmacy Antibiotic Note  Gregory Tyler is a 77 y.o. male admitted on 01/13/2018 with sepsis with suspected urinary source. Pharmacy has been consulted for Vancomycin and Zosyn Dosing.   Scr 3.86 on admission (baseline around 1.8). Renal function continues to decline with Scr 4.23 on 9/25.  Plan: Per CCM on ICU rounds, will discontinue vancomycin as patient is growing pseudomonas in urine culture. Continue with Zosyn pending susceptibilities  Continue Zosyn 3.375 g extended infusion q12h   Height: 5\' 8"  (172.7 cm) Weight: 148 lb 13 oz (67.5 kg) IBW/kg (Calculated) : 68.4  Temp (24hrs), Avg:98 F (36.7 C), Min:97.3 F (36.3 C), Max:98.6 F (37 C)  Recent Labs  Lab 01/13/18 1339 01/13/18 1709 01/13/18 1951 01/14/18 0349 01/15/18 0520  WBC 15.6*  --   --  18.6* 12.9*  CREATININE 3.86*  --  3.68* 3.75* 4.23*  LATICACIDVEN 2.8* 3.2*  --  2.1*  --     Estimated Creatinine Clearance: 13.7 mL/min (A) (by C-G formula based on SCr of 4.23 mg/dL (H)).    Allergies  Allergen Reactions  . Aspirin     GI Bleeding  . Codeine Other (See Comments)    BLISTERS Other reaction(s): Other (See Comments) BLISTERS   . Codeine Sulfate Other (See Comments)    Other reaction(s): Other (See Comments) BLISTERS BLISTERS    Antimicrobials this admission: Vancomycin 9/23 x 1 dose Zosyn 9/23 >>   Microbiology results: 9/23 BCx: NG 2 days 9/23 Urine Cx: pseudomonas aeruginosa 9/23 MRSA PCR: negative  Thank you for allowing pharmacy to be a part of this patient's care.  Tawnya Crook, PharmD Pharmacy Resident  01/15/2018 12:04 PM

## 2018-01-15 NOTE — Evaluation (Signed)
Occupational Therapy Evaluation Patient Details Name: Gregory Tyler MRN: [id number] DOB: August 04, 1940 Today's Date: 01/15/2018    History of Present Illness 77 year old male admitted for AMS and encephalopathy due to UTI, acute renal failure and failure to thrive and was at [facility].  He was here ~1 month ago, for weakness and diarrhea and had pressure ulcers B LES feet as well as sacral pressure sores.  PMH includes stage III CKD, prostate CA, PAF, gout, cardiomyopathy and chronic pelvic pain. 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 He is legally blind.  He is currently limited in functional ADLs due to pain from sacral decubitus and ulcerations of B heels and feet, decreased AROM of BUEs due to hx of rotator cuff injuries and overall weakness and deconditioning. No WB restrictions noted in chart. Pt requires maximum assist for LB dressing and bathing skills due to pain and weakness and would benefit from continued skilled OT services for functional mobility, ADL training, and family education and training. Rec SNF to continue rehabilitation.      Follow Up Recommendations  SNF    Equipment Recommendations       Recommendations for Other Services       Precautions / Restrictions Precautions Precautions: Fall Restrictions Weight Bearing Restrictions: No RLE Weight Bearing: Partial weight bearing LLE Weight Bearing: Partial weight bearing Other Position/Activity Restrictions: no WB restrictions in chart---has painful B ulcerations on feet and heels      Mobility Bed Mobility                  Transfers                      Balance                                           ADL either performed or assessed with clinical judgement    ADL Overall ADL's : Needs assistance/impaired Eating/Feeding: Set up;Minimal assistance Eating/Feeding Details (indicate cue type and reason): family feeding pt due to muscle weakness and decreased active movt of BUEs in shoulders.  Encouraged family to have pt feed himself. Grooming: Wash/dry hands;Wash/dry face;Oral care;Set up;Minimal assistance Grooming Details (indicate cue type and reason): only has bottom teeth Upper Body Bathing: Set up;Moderate assistance   Lower Body Bathing: Set up;Total assistance   Upper Body Dressing : Moderate assistance;Set up;Bed level   Lower Body Dressing: Total assistance;Set up;Bed level Lower Body Dressing Details (indicate cue type and reason): sacral decub and B LE pressure ulcers that are painful with muscle wasting in BLEs as well               General ADL Comments: Pt has been using a depends diaper due to loss of control of bowel and bladder per family report which started with the leg weakness---has hx of rods in back from back surgeries and bulging disks     Vision Baseline Vision/History: Legally blind Patient Visual Report: No change from baseline       Perception     Praxis      Pertinent Vitals/Pain Pain Assessment: Faces Faces Pain  Scale: Hurts even more Pain Location: B feet and heels---very sensitive to sheet and blankets moving over feet Pain Descriptors / Indicators: Discomfort;Constant;Throbbing;Tender Pain Intervention(s): Limited activity within patient's tolerance;Monitored during session;Premedicated before session     Hand Dominance Right   Extremity/Trunk Assessment Upper Extremity Assessment Upper Extremity Assessment: RUE deficits/detail;Generalized weakness;LUE deficits/detail RUE Deficits / Details: 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 BUES with active R shoulder flexion to about 60 degrees and L to 85 degrees with muscle wasting and difficulty using BUEs for ADLs RUE  Sensation: WNL RUE Coordination: decreased gross motor LUE Deficits / Details: see above LUE Sensation: WNL LUE Coordination: decreased gross motor   Lower Extremity Assessment Lower Extremity Assessment: Defer to PT evaluation       Communication Communication Communication: HOH   Cognition Arousal/Alertness: Awake/alert Behavior During Therapy: Flat affect Overall Cognitive Status: History of cognitive impairments - at baseline                                 General Comments: Family stated that he was caring for his wife up until she broke her hip in July and has declined and more confused recently.  He was agreeable to therapy but is grumpy which family states is normal for him.   General Comments       Exercises     Shoulder Instructions      Home Living Family/patient expects to be discharged to:: Skilled nursing facility                                 Additional Comments: [facility]--his wife is there and has end stage Parkinson's and recently fell and broke her hip      Prior Functioning/Environment Level of Independence: Needs assistance  Gait / Transfers Assistance Needed: Pt was using RW several months ago but is only standing and taking few steps with PT at [facility] per family report due to pain in BLEs.   ADL's / Homemaking Assistance Needed: Pt is reliant on children to assist with most needs   Comments: B rotator cuff injuries with R worse than L --used to be a Curator for a living         OT Problem List: Pain;Decreased strength;Impaired balance (sitting and/or standing);Decreased range of motion;Decreased activity tolerance;Impaired UE functional use;Impaired vision/perception      OT Treatment/Interventions: Self-care/ADL training;Patient/family education;Therapeutic activities;Balance training    OT Goals(Current goals can be found in the care plan section) Acute Rehab OT Goals Patient Stated Goal: to go  back to [facility] OT Goal Formulation: With patient/family Time For Goal Achievement: 01/29/18 Potential to Achieve Goals: Fair ADL Goals Pt Will Perform Upper Body Dressing: with set-up;with min assist;sitting Pt Will Perform Lower Body Dressing: with set-up;with max assist;sitting/lateral leans Pt Will Transfer to Toilet: with set-up;stand pivot transfer;bedside commode  OT Frequency: Min 1X/week   Barriers to D/C:            Co-evaluation              AM-PAC PT "6 Clicks" Daily Activity     Outcome Measure Help from another person eating meals?: A Little Help from another person taking care of personal grooming?: A Little Help from another person toileting, which includes using toliet, bedpan, or urinal?: A Lot Help from another person bathing (  including washing, rinsing, drying)?: A Lot Help from another person to put on and taking off regular upper body clothing?: A Little Help from another person to put on and taking off regular lower body clothing?: A Lot 6 Click Score: 15   End of Session    Activity Tolerance: Patient tolerated treatment well Patient left: in bed;with call bell/phone within reach;with bed alarm set;with family/visitor present  OT Visit Diagnosis: Muscle weakness (generalized) (M62.81);Pain;Low vision, both eyes (H54.2);Unsteadiness on feet (R26.81) Pain - Right/Left: Right(bilateral ) Pain - part of body: Ankle and joints of foot                Time: 3154-0086 OT Time Calculation (min): 40 min Charges:  OT General Charges $OT Visit: 1 Visit OT Evaluation $OT Eval Low Complexity: 1 Low OT Treatments $Self Care/Home Management : 23-37 mins  Chrys Racer, OTR/L ascom [phone] 01/15/18, 5:38 PM

## 2018-01-15 NOTE — Progress Notes (Signed)
[facility]                                                                                                                                                                                  Patient Demographics   Gregory Tyler, is a 77 y.o. male, DOB - [birth year], FOY:[id number]  Admit date - 01/13/2018   Admitting Physician Dustin Flock, MD  Outpatient Primary MD for the patient is Sparks, Leonie Douglas, MD   LOS - 2  Subjective:  Patient still on dopamine    Review of Systems:   CONSTITUTIONAL: No documented fever. No fatigue, weakness. No weight gain, no weight loss.  EYES: No blurry or double vision.  ENT: No tinnitus. No postnasal drip. No redness of the oropharynx.  RESPIRATORY: No cough, no wheeze, no hemoptysis. No dyspnea.  CARDIOVASCULAR: No chest pain. No orthopnea. No palpitations. No syncope.  GASTROINTESTINAL: No nausea, no vomiting or diarrhea. No abdominal pain. No melena or hematochezia.  GENITOURINARY: No dysuria or hematuria.  ENDOCRINE: No polyuria or nocturia. No heat or cold intolerance.  HEMATOLOGY: No anemia. No bruising. No bleeding.  INTEGUMENTARY: No rashes. No lesions.  MUSCULOSKELETAL: No arthritis. No swelling. No gout.  Complains of foot pain NEUROLOGIC: No numbness, tingling, or ataxia. No seizure-type activity.  PSYCHIATRIC: No anxiety. No insomnia. No ADD.    Vitals:   Vitals:   01/15/18 0800 01/15/18 0900 01/15/18 1000 01/15/18 1100  BP: 116/60 (!) 87/47 101/61 (!) 95/56  Pulse: 80 70 75 81  Resp: 11 14 12 14   Temp:      TempSrc:      SpO2: 93% 100% 98% 100%  Weight:      Height:        Wt Readings from Last 3 Encounters:  01/13/18 67.5 kg  01/08/18 67.6 kg  12/19/17 68.8 kg     Intake/Output Summary (Last 24 hours) at 01/15/2018 1501 Last data filed at 01/15/2018 1027 Gross per 24 hour  Intake 462.1 ml  Output 900 ml  Net -437.9 ml    Physical Exam:   GENERAL: Pleasant-appearing in  no apparent distress.  HEAD, EYES, EARS, NOSE AND THROAT: Atraumatic, normocephalic. Extraocular muscles are intact. Pupils equal and reactive to light. Sclerae anicteric. No conjunctival injection. No oro-pharyngeal erythema.  NECK: Supple. There is no jugular venous distention. No bruits, no lymphadenopathy, no thyromegaly.  HEART: Regular rate and rhythm,. No murmurs, no rubs, no clicks.  LUNGS: Clear to auscultation bilaterally. No rales or rhonchi. No wheezes.  ABDOMEN: Soft, flat, nontender, nondistended. Has good bowel sounds. No hepatosplenomegaly appreciated.  EXTREMITIES: No evidence of any cyanosis, clubbing, or peripheral edema.  +  2 pedal and radial pulses bilaterally.  NEUROLOGIC: The patient is alert, awake, and oriented x3 with no focal motor or sensory deficits appreciated bilaterally.  SKIN: Moist and warm with no rashes appreciated.  Psych: Not anxious, depressed LN: No inguinal LN enlargement    Antibiotics   Anti-infectives (From admission, onward)   Start     Dose/Rate Route Frequency Ordered Stop   01/15/18 1200  vancomycin (VANCOCIN) IVPB 1000 mg/200 mL premix  Status:  Discontinued     1,000 mg 200 mL/hr over 60 Minutes Intravenous Every 48 hours 01/14/18 1135 01/15/18 1022   01/15/18 1000  vancomycin (VANCOCIN) IVPB 750 mg/150 ml premix  Status:  Discontinued     750 mg 150 mL/hr over 60 Minutes Intravenous Every 48 hours 01/14/18 0839 01/14/18 1135   01/14/18 1000  vancomycin (VANCOCIN) IVPB 750 mg/150 ml premix  Status:  Discontinued     750 mg 150 mL/hr over 60 Minutes Intravenous Every 48 hours 01/13/18 1652 01/14/18 0839   01/14/18 0600  vancomycin (VANCOCIN) IVPB 750 mg/150 ml premix  Status:  Discontinued     750 mg 150 mL/hr over 60 Minutes Intravenous Every 48 hours 01/13/18 1548 01/13/18 1652   01/13/18 2200  piperacillin-tazobactam (ZOSYN) IVPB 3.375 g     3.375 g 12.5 mL/hr over 240 Minutes Intravenous Every 12 hours 01/13/18 1548     01/13/18 1445   vancomycin (VANCOCIN) IVPB 1000 mg/200 mL premix     1,000 mg 200 mL/hr over 60 Minutes Intravenous  Once 01/13/18 1443 01/13/18 1624   01/13/18 1445  piperacillin-tazobactam (ZOSYN) IVPB 3.375 g     3.375 g 100 mL/hr over 30 Minutes Intravenous  Once 01/13/18 1443 01/13/18 1619      Medications   Scheduled Meds: . allopurinol  300 mg Oral Daily  . apixaban  2.5 mg Oral Q12H  . insulin aspart  0-9 Units Subcutaneous TID WC  . levothyroxine  12.5 mcg Intravenous Daily  . OLANZapine  2.5 mg Oral QHS  . [START ON 01/17/2018] rosuvastatin  20 mg Oral Daily  . senna-docusate  2 tablet Oral BID   Continuous Infusions: . sodium chloride    . DOPamine 7 mcg/kg/min (01/15/18 0600)  . piperacillin-tazobactam (ZOSYN)  IV 3.375 g (01/15/18 0900)   PRN Meds:.acetaminophen **OR** acetaminophen, ondansetron **OR** ondansetron (ZOFRAN) IV, polyethylene glycol   Data Review:   Micro Results Recent Results (from the past 240 hour(s))  Blood culture (routine x 2)     Status: None (Preliminary result)   Collection Time: 01/13/18  1:39 PM  Result Value Ref Range Status   Specimen Description RIGHT ANTECUBITAL  Final   Special Requests   Final    BOTTLES DRAWN AEROBIC AND ANAEROBIC Blood Culture adequate volume   Culture   Final    NO GROWTH 2 DAYS Performed at [facility], [address], [place], [place] [address]    Report Status PENDING  Incomplete  Blood culture (routine x 2)     Status: None (Preliminary result)   Collection Time: 01/13/18  1:39 PM  Result Value Ref Range Status   Specimen Description LEFT ANTECUBITAL  Final   Special Requests   Final    BOTTLES DRAWN AEROBIC AND ANAEROBIC Blood Culture results may not be optimal due to an excessive volume of blood received in culture bottles   Culture   Final    NO GROWTH 2 DAYS Performed at [facility], [address], [place], [place] [address]  Report Status PENDING  Incomplete  Urine Culture      Status: Abnormal (Preliminary result)   Collection Time: 01/13/18  1:39 PM  Result Value Ref Range Status   Specimen Description   Final    URINE, RANDOM Performed at [facility], [address], [place], [place] [address]    Special Requests   Final    NONE Performed at [facility], [address] [place], [place] [address]    Culture (A)  Final    >=100,000 COLONIES/mL PSEUDOMONAS AERUGINOSA SUSCEPTIBILITIES TO FOLLOW Performed at [facility], [place] [address] [place], [place] [address]    Report Status PENDING  Incomplete  MRSA PCR Screening     Status: None   Collection Time: 01/13/18  5:13 PM  Result Value Ref Range Status   MRSA by PCR NEGATIVE NEGATIVE Final    Comment:        The GeneXpert MRSA Assay (FDA approved for NASAL specimens only), is one component of a comprehensive MRSA colonization surveillance program. It is not intended to diagnose MRSA infection nor to guide or monitor treatment for MRSA infections. Performed at [facility], [address], [place], [place] [address]     Radiology Reports Ct Head Wo Contrast  Result Date: 01/13/2018 CLINICAL DATA:  Altered level consciousness beginning this morning. History of hypertension, hyperlipidemia, recurrent prostate cancer. EXAM: CT HEAD WITHOUT CONTRAST TECHNIQUE: Contiguous axial images were obtained from the base of the skull through the vertex without intravenous contrast. COMPARISON:  None. FINDINGS: BRAIN: No intraparenchymal hemorrhage, mass effect nor midline shift. The ventricles and sulci are normal for age. Patchy to confluent supratentorial white matter hypodensities. Small area of focal blurring RIGHT posterior frontal gray-white matter junction (series 5, image 14). No abnormal extra-axial fluid collections. Basal cisterns are patent. VASCULAR: Moderate calcific atherosclerosis of the carotid siphons. SKULL: No skull fracture. No significant scalp soft tissue  swelling. SINUSES/ORBITS: Paranasal sinuses are well aerated. Mastoid air cells are well aerated.The included ocular globes and orbital contents are non-suspicious. OTHER: None. IMPRESSION: 1. 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 2. Moderate to severe chronic small vessel ischemic changes. Electronically Signed   By: Elon Alas M.D.   On: 01/13/2018 14:36   US Renal  Result Date: 01/14/2018 CLINICAL DATA:  77 year old male. Renal failure. Initial encounter. EXAM: RENAL / URINARY TRACT ULTRASOUND COMPLETE COMPARISON:  11/15/2017 CT. FINDINGS: Right Kidney: Length: 10 cm. Echogenicity within normal limits. No mass or hydronephrosis visualized. Left Kidney: Length: 9.1 cm. Echogenicity within normal limits. No hydronephrosis. Nonobstructing renal calculi measure up to 5 mm. Evaluation limited although no mass identified. Bladder: Decompressed by Foley catheter IMPRESSION: 1. No hydronephrosis. 2. Nonobstructing left renal calculi measuring up to 5 mm. Electronically Signed   By: Genia Del M.D.   On: 01/14/2018 10:27   Dg Chest Port 1 View  Result Date: 01/15/2018 CLINICAL DATA:  Shortness of breath EXAM: PORTABLE CHEST 1 VIEW COMPARISON:  January 13, 2018 FINDINGS: The heart size and mediastinal contours are stable. Left central venous line is unchanged. Both lungs are clear. The visualized skeletal structures are stable. IMPRESSION: No active cardiopulmonary disease. Electronically Signed   By: Abelardo Diesel M.D.   On: 01/15/2018 10:45   Dg Chest Port 1 View  Result Date: 01/13/2018 CLINICAL DATA:  Central line placement EXAM: PORTABLE CHEST 1 VIEW COMPARISON:  Chest x-ray from earlier same day. FINDINGS: LEFT subclavian central line in place with tip positioned  at the level of the mid/upper SVC. Heart size and mediastinal contours are within normal limits. Lungs are clear. No pleural effusion or pneumothorax seen. IMPRESSION: LEFT subclavian central line placement  with tip positioned at the level of the mid/upper SVC. No pneumothorax seen. Electronically Signed   By: Franki Cabot M.D.   On: 01/13/2018 18:42   Dg Abdomen Acute W/chest  Result Date: 01/13/2018 CLINICAL DATA:  Abdominal pain with altered mental status EXAM: DG ABDOMEN ACUTE W/ 1V CHEST COMPARISON:  Chest radiograph Sep 11, 2017; chest CT November 15, 2017 FINDINGS: PA chest: Lungs are clear. Heart size and pulmonary vascularity are normal. No adenopathy. There is aortic atherosclerosis. There are surgical clips in the upper abdomen. Supine and upright abdomen: There is fairly diffuse stool throughout the colon. There is no bowel dilatation or air-fluid level to suggest bowel obstruction. No free air. There is postoperative change in the lower lumbar region. There are multiple surgical clips in the upper abdomen. There are also surgical clips in the pelvis. IMPRESSION: Multiple areas of postoperative change in the abdomen pelvis. Diffuse stool throughout much of the colon. No evident bowel obstruction or free air. No lung edema or consolidation. There is aortic atherosclerosis. Aortic Atherosclerosis (ICD10-I70.0). Electronically Signed   By: Lowella Grip III M.D.   On: 01/13/2018 14:27   US Abdomen Limited Ruq  Result Date: 01/14/2018 CLINICAL DATA:  77 year old male with abnormal liver function studies. Initial encounter. EXAM: ULTRASOUND ABDOMEN LIMITED RIGHT UPPER QUADRANT COMPARISON:  11/15/2017 CT. FINDINGS: Gallbladder: Prior cholecystectomy. Common bile duct: Diameter: 4 mm Liver: Liver of slight increased echogenicity consistent with fatty infiltration and/or hepatocellular disease. No focal hepatic lesion noted. Portal vein is patent on color Doppler imaging with normal direction of blood flow towards the liver. IMPRESSION: 1. Post cholecystectomy. 2. Common bile duct of normal size. 3. Liver of slight increased echogenicity possibly related to result of fatty infiltration or hepatocellular  disease. No focal hepatic lesion noted. Portal vein patent. Electronically Signed   By: Genia Del M.D.   On: 01/14/2018 10:30     CBC Recent Labs  Lab 01/13/18 1339 01/14/18 0349 01/15/18 0520  WBC 15.6* 18.6* 12.9*  HGB 10.3* 10.3* 9.5*  HCT 30.8* 30.4* 27.4*  PLT 389 368 294  MCV 94.2 93.2 92.7  MCH 31.4 31.7 32.0  MCHC 33.4 34.0 34.5  RDW 16.0* 15.6* 15.7*  LYMPHSABS 1.3 1.4 1.7  MONOABS 0.6 0.8 0.9  EOSABS 0.0 0.0 0.0  BASOSABS 0.0 0.0 0.0    Chemistries  Recent Labs  Lab 01/13/18 1339 01/13/18 1951 01/14/18 0349 01/14/18 0917 01/15/18 0520  NA 136 135 136  --  137  K 5.4* 5.5* 5.8* 5.3* 4.9  CL 105 108 105  --  107  CO2 20* 18* 19*  --  21*  GLUCOSE 97 91 165*  --  134*  BUN 89* 84* 85*  --  87*  CREATININE 3.86* 3.68* 3.75*  --  4.23*  CALCIUM 7.8* 7.3* 7.5*  --  7.1*  MG  --   --  1.9  --  1.9  AST 364* 275* 188*  --  94*  ALT 256* 239* 225*  --  152*  ALKPHOS 317* 287* 296*  --  234*  BILITOT 0.7 0.8 0.6  --  0.6   ------------------------------------------------------------------------------------------------------------------ estimated creatinine clearance is 13.7 mL/min (A) (by C-G formula based on SCr of 4.23 mg/dL (H)). ------------------------------------------------------------------------------------------------------------------ No results for input(s): HGBA1C in the last 72 hours. ------------------------------------------------------------------------------------------------------------------  No results for input(s): CHOL, HDL, LDLCALC, TRIG, CHOLHDL, LDLDIRECT in the last 72 hours. ------------------------------------------------------------------------------------------------------------------ Recent Labs    01/13/18 1951  TSH 10.319*   ------------------------------------------------------------------------------------------------------------------ No results for input(s): VITAMINB12, FOLATE, FERRITIN, TIBC, IRON, RETICCTPCT in the  last 72 hours.  Coagulation profile No results for input(s): INR, PROTIME in the last 168 hours.  No results for input(s): DDIMER in the last 72 hours.  Cardiac Enzymes Recent Labs  Lab 01/13/18 1339 01/13/18 1951 01/14/18 0349  TROPONINI 0.04* 0.04* 0.05*   ------------------------------------------------------------------------------------------------------------------ Invalid input(s): POCBNP    Assessment & Plan   IMPRESSION AND PLAN: Patient 77 year old nursing home due to altered mental status  1.  Acute encephalopathy due to sepsis Mental status improved  2.  Sepsis due to UTI continue broad-spectrum antibiotics as well as pressor  3.  Acute renal failure likely due to ATN renal function worsened today nephrology following  4.  Elevated LFTs likely due to shock liver liver function improving daily  5.  Chronic systolic CHF blood pressure low hold all medications that could lower blood pressure including diuretic  6.  Diabetes type 2 continue sliding-scale insulin  7.  Hypertension currently hypotensive hold blood pressure meds  8.  Miscellaneous patient on Eliquis which we will continue      Code Status Orders  (From admission, onward)         Start     Ordered   01/13/18 1713  Do not attempt resuscitation (DNR)  Continuous    Question Answer Comment  In the event of cardiac or respiratory ARREST Do not call a "code blue"   In the event of cardiac or respiratory ARREST Do not perform Intubation, CPR, defibrillation or ACLS   In the event of cardiac or respiratory ARREST Use medication by any route, position, wound care, and other measures to relive pain and suffering. May use oxygen, suction and manual treatment of airway obstruction as needed for comfort.   Comments RN may pronounce      01/13/18 1712        Code Status History    Date Active Date Inactive Code Status Order ID Comments User Context   12/19/2017 1932 12/20/2017 2151 DNR  [id number]  [name], MD Inpatient   11/17/2017 1357 11/19/2017 1603 DNR [id number]  Hermelinda Dellen, DO Inpatient   11/15/2017 1550 11/17/2017 1357 Full Code [id number]  Loletha Grayer, MD ED   09/11/2017 1951 09/15/2017 1603 Full Code [id number]  Bettey Costa, MD ED   05/11/2015 1307 05/13/2015 1256 Full Code [id number]  Aldean Jewett, MD Inpatient   04/25/2015 0149 04/27/2015 1741 Full Code [id number]  Hillary Bow, MD ED           Consults pulmonary critical care, nephrology  DVT Prophylaxis Eliquis  Lab Results  Component Value Date   PLT 294 01/15/2018     Time Spent in minutes   25min  Greater than 50% of time spent in care coordination and counseling patient regarding the condition and plan of care.   Dustin Flock M.D on 01/15/2018 at 3:01 PM  Between 7am to 6pm - Pager - [phone]  After 6pm go to [email or web address] - Proofreader  [employer]   Office  [phone]

## 2018-01-15 NOTE — Progress Notes (Signed)
*  PRELIMINARY RESULTS* Echocardiogram 2D Echocardiogram has been performed.  Gregory Tyler 01/15/2018, 2:52 PM

## 2018-01-15 NOTE — Progress Notes (Signed)
[name], M.D Pulmonary Critical Care & Sleep Medicine   ICU Patient Progress Note  Name: Gregory Tyler  DOB: [birth year]  MRN: [id number]  Date: 01/15/2018   [x] I have reviewed the flowsheet and previous day's notes.  IMPRESSION:   Altered mental status: Most likely related to sepsis, patient is currently alert awake oriented x3 able to move all 4 extremities, head CT is okay  Severe sepsis: Possible urinary source Pseudomonas in the urine, panculture sent, blood negative, vancomycin stopped on 01/15/2018 received for 3 days, continue Zosyn  Septic shock: Received 3 L of fluid, as patient is slightly bradycardic will continue with dopamine, taper as tolerated, LR stopped due to hypotension, start normal saline at 75 cc/h  Renal failure: Most likely due to severe sepsis/septic shock and dehydration, renal consult is done, avoid nephrotoxic medication, IV fluids, monitor, Foley catheter was placed with good urine output t, renal ultrasound showed nonobstructive stone without hydronephrosis  Lactic acidosis: Most likely due to sepsis/septic shock, patient was on metformin at home, improvement to 2.1  Atrial fibrillation: Hold metoprolol, continue with Eliquis- adjusted Eliquis dose to 2.5 mg twice daily-as per the son had some issues with the bleeding in the past and cardiology suggested to just use 2.5 twice daily  Diabetes: Sliding scale coverage  Hypothyroidism: Explains lots of symptoms, start Synthroid 12.5 IV daily-overall parameters and heart rate improved  Nausea: Most likely related to sepsis if continue to have issues will consider abdominal CT-follow-up abdominal sono and determine further course of action  Abnormal LFTs: Most likely due to shock and hypotension, right upper quadrant sono is okay, status post cholecystectomy in the past, LFTs are slowly improving  PLAN:  CVS: Hypotension probably due to sepsis, last echo done in May 2019 showed a preserved EF, due to  ongoing hypertension repeat echo is ordered serial cardiac enzymes, mild leak of troponin most likely due to sepsis, continue Eliquis for A. fib and monitor, keep her dopamine as tolerated RS: On room air doing well, some history suggest that he has history of emphysema use bronchodilators as needed currently not showing any signs of cough or wheezing. -- Aspiration precaution ID: Panculture, blood negative so far, urine showing Pseudomonas, started on Zosyn only, vancomycin stopped on 01/15/2089, sono,-lipase is okay -- Follow culture and adjust ENDO: Low T4 with high TSH-bradycardia-hypertension we will start IV Synthroid and monitor, hold metformin -- SSI monitor blood sugar GI: Monitor LFTs, right upper quadrant sono is okay, LFTs are coming down, started on diet doing well RENAL: Renal consult appreciated, avoid nephrotoxic medication, IV fluids-LR is switched to normal saline due to hypotension -- Electrolyte replacement protocol, Monitor Cr and K -Potassium was 4.9 CNS: AMS improved, head CT okay HEMATOLOGY: Monitor H&H -- Monitor HB and PLT MUSCULOSKELETAL: No issues PAIN AND SEDATION: PRN meds  Skin/Wound: Sacral decubitus, heel decubitus might be a source of sepsis also  Electrolytes: Replace electrolytes per ICU electrolyte replacement protocol.   IVF: Normal saline at 75, received 1 fluid bolus for low CVP  Nutrition: Diet as tolerated  Prophylaxis: DVT Prophylaxis with Eliquis,. GI Prophylaxis.   Restraints: None  PT/OT eval and treat. OOB when appropriate.   Lines/Tubes: Not able to place Foley left subclavian 01/13/2018 central line. ADVANCE DIRECTIVE: DNR/DNI FAMILY DISCUSSION: Spoke with the son at length Quality Care: PPI, DVT prophylaxis, HOB elevated, Infection control all reviewed and addressed. Events and notes from last 24 hours reviewed. Care plan discussed on multidisciplinary rounds CC TIME: 32  minutes excluding procedures.   HPI: Gregory Tyler  is a 77  y.o. male with a known history of chronic systolic CHF, diabetes type 2, paroxysmal atrial fibrillation, coronary artery disease, gastric ulcer, hypertension, hyperlipidemia who currently resides in a skilled nursing facility brought from there for altered mental status.  Patient son states that his father was in usual state of health yesterday however today he was confused and lethargic.  Patient noted to be hypotensive, noted to have acute renal failure, noted to have elevated liver function test.  Also noticed to have UTI. Patient was transferred to ICU as patient required low-dose dopamine - Already been seen by renal consult - Left subclavian central line was placed in the ICU  Subjective: Doing well no significant events overnight, potassium was 5.8 treated now it is 4.9, improvement in urine output 2050, blood sugar is 155 diabetic at home, mild worsening in creatinine 4.23 TSH was 10, free T4 0.70 -LR stopped by renal due to potassium content - Spoke with son at length  Significant event: 01/13/2018: Admission, subclavian line placement, dopamine  [] The patient is critically ill on     [] Mechanical ventilation [] Pressors  [] BiPAP []    MEDICATIONS:   allopurinol 300 mg Daily  apixaban 2.5 mg Q12H  insulin aspart 0-9 Units TID WC  levothyroxine 12.5 mcg Daily  OLANZapine 2.5 mg QHS  [START ON 01/17/2018] rosuvastatin 20 mg Daily  senna-docusate 2 tablet BID    Vital Signs:   BP (!) 95/56   Pulse 81   Temp 98.6 F (37 C) (Oral)   Resp 14   Ht 5' 8"  (1.727 m)   Wt 67.5 kg   SpO2 100%   BMI 22.63 kg/m       Prehospital Care Cardiac Rhythm: Normal sinus rhythm, Bundle branch block Heart Block Type: 1st degree AVB  Temp (24hrs), Avg:98 F (36.7 C), Min:97.3 F (36.3 C), Max:98.6 F (37 C)   Intake/Output:Last shift:      09/25 0701 - 09/25 1900 In: 240 [P.O.:240] Out: - Last 3 shifts: 09/23 1901 - 09/25 0700 In: 4337.5 [I.V.:3484.1] Out: 1370  [Urine:1370]  Intake/Output Summary (Last 24 hours) at 01/15/2018 1459 Last data filed at 01/15/2018 1027 Gross per 24 hour  Intake 462.1 ml  Output 900 ml  Net -437.9 ml    Objective:   Physical Exam:  General: comfortable, no acute distress HEENT: pupils reactive, sclera anicteric, EOM intact Neck: No adenopathy or thyroid swelling, no lymphadenopathy or JVD, supple CVS: S1S2 no murmurs RS: Mod AE bilaterally, Abd: soft, non tender, no hepatosplenomegaly Neuro: non focal, awake, alert Extrm: Heal dressings sacral decub    CBC w/Diff Recent Labs  Lab 01/13/18 1339 01/14/18 0349 01/15/18 0520  HGB 10.3* 10.3* 9.5*  HCT 30.8* 30.4* 27.4*  WBC 15.6* 18.6* 12.9*  PLT 389 368 294     Chemistry Recent Labs  Lab 01/13/18 1951 01/14/18 0349 01/14/18 0917 01/15/18 0520  NA 135 136  --  137  K 5.5* 5.8* 5.3* 4.9  CL 108 105  --  107  CO2 18* 19*  --  21*  BUN 84* 85*  --  87*  CREATININE 3.68* 3.75*  --  4.23*  GLUCOSE 91 165*  --  134*    Hepatic Function Latest Ref Rng & Units 01/15/2018 01/14/2018 01/13/2018  Total Protein 6.5 - 8.1 g/dL 5.4(L) 6.2(L) 5.8(L)  Albumin 3.5 - 5.0 g/dL 2.3(L) 2.5(L) 2.5(L)  AST 15 - 41 U/L 94(H) 188(H) 275(H)  ALT 0 - 44 U/L 152(H) 225(H) 239(H)  Alk Phosphatase 38 - 126 U/L 234(H) 296(H) 287(H)  Total Bilirubin 0.3 - 1.2 mg/dL 0.6 0.6 0.8  Bilirubin, Direct 0.0 - 0.2 mg/dL 0.2 - -     Lactic Acid    Component Value Date/Time   LATICACIDVEN 2.1 (HH) 01/14/2018 0349     Micro  @MICRO @ Recent Results (from the past 720 hour(s))  Blood culture (routine x 2)     Status: None (Preliminary result)   Collection Time: 01/13/18  1:39 PM  Result Value Ref Range Status   Specimen Description RIGHT ANTECUBITAL  Final   Special Requests   Final    BOTTLES DRAWN AEROBIC AND ANAEROBIC Blood Culture adequate volume   Culture   Final    NO GROWTH 2 DAYS Performed at [facility], [address] [place], [place] [address]     Report Status PENDING  Incomplete  Blood culture (routine x 2)     Status: None (Preliminary result)   Collection Time: 01/13/18  1:39 PM  Result Value Ref Range Status   Specimen Description LEFT ANTECUBITAL  Final   Special Requests   Final    BOTTLES DRAWN AEROBIC AND ANAEROBIC Blood Culture results may not be optimal due to an excessive volume of blood received in culture bottles   Culture   Final    NO GROWTH 2 DAYS Performed at [facility], [address], [place], [place] [address]    Report Status PENDING  Incomplete  Urine Culture     Status: Abnormal (Preliminary result)   Collection Time: 01/13/18  1:39 PM  Result Value Ref Range Status   Specimen Description   Final    URINE, RANDOM Performed at [facility], [address], [place], [place] [address]    Special Requests   Final    NONE Performed at [facility], [address], [place], [place] [address]    Culture (A)  Final    >=100,000 COLONIES/mL PSEUDOMONAS AERUGINOSA SUSCEPTIBILITIES TO FOLLOW Performed at [facility], [place] [address] [place], [place] [address]    Report Status PENDING  Incomplete  MRSA PCR Screening     Status: None   Collection Time: 01/13/18  5:13 PM  Result Value Ref Range Status   MRSA by PCR NEGATIVE NEGATIVE Final    Comment:        The GeneXpert MRSA Assay (FDA approved for NASAL specimens only), is one component of a comprehensive MRSA colonization surveillance program. It is not intended to diagnose MRSA infection nor to guide or monitor treatment for MRSA infections. Performed at [facility], [address] [place], [place] [address]      ABG No results found for: PHART, PCO2ART, PO2ART, HCO3, TCO2, ACIDBASEDEF, O2SAT   Cardiac Enzymes Recent Labs    01/13/18 1339 01/13/18 1951 01/14/18 0349 01/15/18 0520  CKTOTAL  --   --   --  54  TROPONINI 0.04* 0.04* 0.05*  --       Coagulation Lab Results  Component Value  Date   INR 1.75 11/19/2017     Radiology Results Reviewed by me and compared with previous imaging studies Ct Head Wo Contrast  Result Date: 01/13/2018 CLINICAL DATA:  Altered level consciousness beginning this morning. History of hypertension, hyperlipidemia, recurrent prostate cancer. EXAM: CT HEAD WITHOUT CONTRAST TECHNIQUE: Contiguous axial images were obtained from the base of the skull through the vertex without intravenous contrast. COMPARISON:  None. FINDINGS: BRAIN: No intraparenchymal hemorrhage, mass effect nor midline shift. The ventricles and sulci are normal for age. Patchy to confluent supratentorial white matter hypodensities. Small area of focal blurring RIGHT posterior frontal gray-white matter junction (series 5, image 14). No abnormal extra-axial fluid collections. Basal cisterns are patent. VASCULAR: Moderate calcific atherosclerosis of the carotid siphons. SKULL: No skull fracture. No significant scalp soft tissue swelling. SINUSES/ORBITS: Paranasal sinuses are well aerated. Mastoid air cells are well aerated.The included ocular globes and orbital contents are non-suspicious. OTHER: None. IMPRESSION: 1. 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 2. Moderate to severe chronic small vessel ischemic changes. Electronically Signed   By: Elon Alas M.D.   On: 01/13/2018 14:36   US Renal  Result Date: 01/14/2018 CLINICAL DATA:  77 year old male. Renal failure. Initial encounter. EXAM: RENAL / URINARY TRACT ULTRASOUND COMPLETE COMPARISON:  11/15/2017 CT. FINDINGS: Right Kidney: Length: 10 cm. Echogenicity within normal limits. No mass or hydronephrosis visualized. Left Kidney: Length: 9.1 cm. Echogenicity within normal limits. No hydronephrosis. Nonobstructing renal calculi measure up to 5 mm. Evaluation limited although no mass identified. Bladder: Decompressed by Foley catheter IMPRESSION: 1. No hydronephrosis. 2. Nonobstructing left renal calculi measuring up to  5 mm. Electronically Signed   By: Genia Del M.D.   On: 01/14/2018 10:27   Dg Chest Port 1 View  Result Date: 01/15/2018 CLINICAL DATA:  Shortness of breath EXAM: PORTABLE CHEST 1 VIEW COMPARISON:  January 13, 2018 FINDINGS: The heart size and mediastinal contours are stable. Left central venous line is unchanged. Both lungs are clear. The visualized skeletal structures are stable. IMPRESSION: No active cardiopulmonary disease. Electronically Signed   By: Abelardo Diesel M.D.   On: 01/15/2018 10:45   Dg Chest Port 1 View  Result Date: 01/13/2018 CLINICAL DATA:  Central line placement EXAM: PORTABLE CHEST 1 VIEW COMPARISON:  Chest x-ray from earlier same day. FINDINGS: LEFT subclavian central line in place with tip positioned at the level of the mid/upper SVC. Heart size and mediastinal contours are within normal limits. Lungs are clear. No pleural effusion or pneumothorax seen. IMPRESSION: LEFT subclavian central line placement with tip positioned at the level of the mid/upper SVC. No pneumothorax seen. Electronically Signed   By: Franki Cabot M.D.   On: 01/13/2018 18:42   Dg Abdomen Acute W/chest  Result Date: 01/13/2018 CLINICAL DATA:  Abdominal pain with altered mental status EXAM: DG ABDOMEN ACUTE W/ 1V CHEST COMPARISON:  Chest radiograph Sep 11, 2017; chest CT November 15, 2017 FINDINGS: PA chest: Lungs are clear. Heart size and pulmonary vascularity are normal. No adenopathy. There is aortic atherosclerosis. There are surgical clips in the upper abdomen. Supine and upright abdomen: There is fairly diffuse stool throughout the colon. There is no bowel dilatation or air-fluid level to suggest bowel obstruction. No free air. There is postoperative change in the lower lumbar region. There are multiple surgical clips in the upper abdomen. There are also surgical clips in the pelvis. IMPRESSION: Multiple areas of postoperative change in the abdomen pelvis. Diffuse stool throughout much of the colon. No  evident bowel obstruction or free air. No lung edema or consolidation. There is aortic atherosclerosis. Aortic Atherosclerosis (ICD10-I70.0). Electronically Signed   By: Lowella Grip III M.D.   On: 01/13/2018 14:27   US Abdomen Limited Ruq  Result Date: 01/14/2018 CLINICAL DATA:  77 year old male with abnormal liver function studies. Initial encounter. EXAM: ULTRASOUND ABDOMEN LIMITED RIGHT UPPER QUADRANT COMPARISON:  11/15/2017 CT. FINDINGS: Gallbladder: Prior  cholecystectomy. Common bile duct: Diameter: 4 mm Liver: Liver of slight increased echogenicity consistent with fatty infiltration and/or hepatocellular disease. No focal hepatic lesion noted. Portal vein is patent on color Doppler imaging with normal direction of blood flow towards the liver. IMPRESSION: 1. Post cholecystectomy. 2. Common bile duct of normal size. 3. Liver of slight increased echogenicity possibly related to result of fatty infiltration or hepatocellular disease. No focal hepatic lesion noted. Portal vein patent. Electronically Signed   By: Genia Del M.D.   On: 01/14/2018 10:30      ECHO Study Conclusions  - Left ventricle: The cavity size was normal. Systolic function was   normal. The estimated ejection fraction was in the range of 55%   to 60%. Wall motion was normal; there were no regional wall   motion abnormalities. Features are consistent with a pseudonormal   left ventricular filling pattern, with concomitant abnormal   relaxation and increased filling pressure (grade 2 diastolic   dysfunction). - Aortic valve: Peak velocity (S): 192 cm/s. Mean gradient (S): 8   mm Hg. - Mitral valve: There was mild regurgitation. - Left atrium: The atrium was mildly dilated. - Right ventricle: Systolic function was normal. - Pulmonary arteries: Systolic pressure was mildly elevated. PA   peak pressure: 35 mm Hg (S).   01/15/2018   The patient is: [x]  acutely ill Risk of deterioration: []  moderate   [x]  critically  ill  []  high   [x] See my orders for details  My assessment, plan of care, findings, medications, side effects etc were discussed with: [x] nursing [] PT/OT   [x] respiratory therapy [] Dr.  [] family []    Serita Grit, M.D. Pulmonary Critical Care & Sleep Medicine

## 2018-01-16 ENCOUNTER — Inpatient Hospital Stay: Payer: Medicare Other

## 2018-01-16 LAB — COMPREHENSIVE METABOLIC PANEL
ALT: 685 U/L — AB (ref 0–44)
AST: 1136 U/L — AB (ref 15–41)
Albumin: 2 g/dL — ABNORMAL LOW (ref 3.5–5.0)
Alkaline Phosphatase: 443 U/L — ABNORMAL HIGH (ref 38–126)
Anion gap: 11 (ref 5–15)
BILIRUBIN TOTAL: 0.5 mg/dL (ref 0.3–1.2)
BUN: 84 mg/dL — AB (ref 8–23)
CALCIUM: 7 mg/dL — AB (ref 8.9–10.3)
CO2: 21 mmol/L — ABNORMAL LOW (ref 22–32)
CREATININE: 4.78 mg/dL — AB (ref 0.61–1.24)
Chloride: 108 mmol/L (ref 98–111)
GFR calc Af Amer: 12 mL/min — ABNORMAL LOW (ref >60)
GFR, EST NON AFRICAN AMERICAN: 11 mL/min — AB (ref >60)
Glucose, Bld: 150 mg/dL — ABNORMAL HIGH (ref 70–99)
Potassium: 4.8 mmol/L (ref 3.5–5.1)
Sodium: 140 mmol/L (ref 135–145)
TOTAL PROTEIN: 5.3 g/dL — AB (ref 6.5–8.1)

## 2018-01-16 LAB — CBC WITH DIFFERENTIAL/PLATELET
BASOS ABS: 0 10*3/uL (ref 0–0.1)
Basophils Relative: 0 %
Eosinophils Absolute: 0.1 10*3/uL (ref 0–0.7)
Eosinophils Relative: 1 %
HEMATOCRIT: 25.7 % — AB (ref 40.0–52.0)
Hemoglobin: 9 g/dL — ABNORMAL LOW (ref 13.0–18.0)
LYMPHS ABS: 1.6 10*3/uL (ref 1.0–3.6)
Lymphocytes Relative: 20 %
MCH: 32.5 pg (ref 26.0–34.0)
MCHC: 35 g/dL (ref 32.0–36.0)
MCV: 92.8 fL (ref 80.0–100.0)
MONO ABS: 0.5 10*3/uL (ref 0.2–1.0)
Monocytes Relative: 7 %
NEUTROS ABS: 5.7 10*3/uL (ref 1.4–6.5)
Neutrophils Relative %: 72 %
Platelets: 241 10*3/uL (ref 150–440)
RBC: 2.77 MIL/uL — AB (ref 4.40–5.90)
RDW: 15.2 % — AB (ref 11.5–14.5)
WBC: 7.9 10*3/uL (ref 3.8–10.6)

## 2018-01-16 LAB — GLUCOSE, CAPILLARY
Glucose-Capillary: 195 mg/dL — ABNORMAL HIGH (ref 70–99)
Glucose-Capillary: 129 mg/dL — ABNORMAL HIGH (ref 70–99)
Glucose-Capillary: 192 mg/dL — ABNORMAL HIGH (ref 70–99)
Glucose-Capillary: 147 mg/dL — ABNORMAL HIGH (ref 70–99)
Glucose-Capillary: 172 mg/dL — ABNORMAL HIGH (ref 70–99)

## 2018-01-16 LAB — URINE CULTURE

## 2018-01-16 LAB — MAGNESIUM: MAGNESIUM: 1.9 mg/dL (ref 1.7–2.4)

## 2018-01-16 MED ORDER — SODIUM CHLORIDE 0.9 % IV SOLN
2.0000 g | INTRAVENOUS | Status: DC
  Administered 2018-01-16 – 2018-01-17 (×2): 2 g via INTRAVENOUS
  Filled 2018-01-16 (×3): qty 2

## 2018-01-16 MED ORDER — MIDODRINE HCL 5 MG PO TABS
5.0000 mg | ORAL_TABLET | Freq: Three times a day (TID) | ORAL | Status: DC
  Administered 2018-01-16 (×2): 5 mg via ORAL
  Filled 2018-01-16 (×5): qty 1

## 2018-01-16 MED ORDER — ALLOPURINOL 100 MG PO TABS
100.0000 mg | ORAL_TABLET | Freq: Every day | ORAL | Status: DC
  Administered 2018-01-17 – 2018-01-30 (×13): 100 mg via ORAL
  Filled 2018-01-16 (×13): qty 1

## 2018-01-16 NOTE — Consult Note (Signed)
Pharmacy Antibiotic Note  Gregory Tyler is a 77 y.o. male admitted on 01/13/2018 with sepsis with suspected urinary source. Pharmacy has been consulted for Vancomycin and Zosyn Dosing. Vancomycin discontinued 9/25 for pseudomonas in the urine.  Scr 3.86 on admission (baseline around 1.8). Renal function continues to decline.  Plan: Per CCM on ICU rounds, will transition Zosyn to cefepime 2 g IV q24h with first dose to start today at 2200   Height: 5\' 8"  (172.7 cm) Weight: 148 lb 13 oz (67.5 kg) IBW/kg (Calculated) : 68.4  Temp (24hrs), Avg:98.3 F (36.8 C), Min:98.2 F (36.8 C), Max:98.4 F (36.9 C)  Recent Labs  Lab 01/13/18 1339 01/13/18 1709 01/13/18 1951 01/14/18 0349 01/15/18 0520 01/16/18 0513  WBC 15.6*  --   --  18.6* 12.9* 7.9  CREATININE 3.86*  --  3.68* 3.75* 4.23* 4.78*  LATICACIDVEN 2.8* 3.2*  --  2.1*  --   --     Estimated Creatinine Clearance: 12.2 mL/min (A) (by C-G formula based on SCr of 4.78 mg/dL (H)).    Allergies  Allergen Reactions  . Aspirin     GI Bleeding  . Codeine Other (See Comments)    BLISTERS Other reaction(s): Other (See Comments) BLISTERS   . Codeine Sulfate Other (See Comments)    Other reaction(s): Other (See Comments) BLISTERS BLISTERS    Antimicrobials this admission: Vancomycin 9/23 x 1 dose Zosyn 9/23 >> 9/26 Cefepime 9/26 >>  Microbiology results: 9/23 BCx: NG 2 days 9/23 Urine Cx: pseudomonas aeruginosa (sensitive to ceftazidime and cefepime) 9/23 MRSA PCR: negative  Thank you for allowing pharmacy to be a part of this patient's care.  Tawnya Crook, PharmD Pharmacy Resident  01/16/2018 2:26 PM

## 2018-01-16 NOTE — Progress Notes (Signed)
PT Cancellation Note  Patient Details Name: Gregory Tyler MRN: [id number] DOB: 01/23/1941   Cancelled Treatment:    Reason Eval/Treat Not Completed: Other (comment).  Pt is trying to eat breakfast and will try later as time and pt allow.   Ramond Dial 01/16/2018, 9:06 AM   Mee Hives, PT MS Acute Rehab Dept. Number: [name] [facility] [name]

## 2018-01-16 NOTE — Progress Notes (Signed)
PT Cancellation Note  Patient Details Name: Gregory Tyler MRN: [id number] DOB: [birth year]   Cancelled Treatment:    Reason Eval/Treat Not Completed: Other (comment).  Pt is declining therapy as he reports he is too tired and has pain in stomach.  Follow up in the AM to make a decision about dc planning.   Ramond Dial 01/16/2018, 3:23 PM   Mee Hives, PT MS Acute Rehab Dept. Number: [name] [facility] [place]

## 2018-01-16 NOTE — Progress Notes (Addendum)
Gregory Tyler, M.D Pulmonary Critical Care & Sleep Medicine   ICU Patient Progress Note  Name: Gregory Tyler  DOB: May 06, 1940  MRN: [id number]  Date: 01/16/2018   [x] I have reviewed the flowsheet and previous day's notes.  IMPRESSION:   Altered mental status: Most likely related to sepsis, patient is currently alert awake oriented x3 able to move all 4 extremities, head CT is okay  Severe sepsis: Possible urinary source Pseudomonas in the urine, panculture sent, blood negative, vancomycin stopped on 01/15/2018 received for 3 days, Pseudomonas is pan sensitive and changed to cefepime with total course of 10-14 days   Septic shock: Received 3 L of fluid, as patient is slightly bradycardic will continue with dopamine, taper as tolerated, start normal saline at 75 cc/h, Add midodrine and see if that helps  Renal failure: Most likely due to severe sepsis/septic shock and dehydration, renal consult is done, avoid nephrotoxic medication, IV fluids, monitor, Foley catheter was placed with good urine output t, renal ultrasound showed nonobstructive stone without hydronephrosis  Lactic acidosis: Most likely due to sepsis/septic shock, patient was on metformin at home, improvement to 2.1  Atrial fibrillation: Hold metoprolol, continue with Eliquis- adjusted Eliquis dose to 2.5 mg twice daily-as per the son had some issues with the bleeding in the past and cardiology suggested to just use 2.5 twice daily-- Cr is high if continue ot have issues consider changing to IV heparin  Diabetes: Sliding scale coverage  Hypothyroidism: Explains lots of symptoms, start Synthroid 12.5 IV daily-overall parameters and heart rate improved  Nausea: Most likely related to sepsis if continue to have issues will consider abdominal CT-follow-up abdominal sono and determine further course of action  Abnormal LFTs: Most likely due to shock and hypotension, right upper quadrant sono is okay, status post cholecystectomy  in the past, LFTs are up monitor on that   PLAN:  CVS: Hypotension probably due to sepsis, last echo done in May 2019 showed a preserved EF, due to ongoing hypertension repeat echo is ordered which still did not show any new abnormality. serial cardiac enzymes, mild leak of troponin most likely due to sepsis, continue Eliquis for A. fib and monitor, keep her dopamine as tolerated IF CR continue to remain elevated change elaquis to heparin RS: On room air doing well, some history suggest that he has history of emphysema use bronchodilators as needed currently not showing any signs of cough or wheezing. -- Aspiration precaution ID: Panculture, blood negative so far, urine showing Pseudomonas, started on Zosyn only, vancomycin stopped on 01/15/2089, sono,-lipase is okay -- Pseudomonas pan sensitive and started on cefepime on 9/25 with stop date on 10/2 ENDO: Low T4 with high TSH-bradycardia-hypertension we will start IV Synthroid and monitor, hold metformin -- SSI monitor blood sugar GI: Monitor LFTs, right upper quadrant sono is okay, LFTs are up again monitor with lfts RENAL: Renal consult appreciated, avoid nephrotoxic medication, IV fluids-LR is switched to normal saline due to hyperkalemia -- Electrolyte replacement protocol, Monitor Cr and K - Potassium was 4.9 CNS: AMS improved, head CT okay HEMATOLOGY: Monitor H&H -- Monitor HB and PLT MUSCULOSKELETAL: No issues PAIN AND SEDATION: PRN meds  Skin/Wound: Sacral decubitus, heel decubitus might be a source of sepsis also  Electrolytes: Replace electrolytes per ICU electrolyte replacement protocol.   IVF: Normal saline at 75, received 1 fluid bolus for low CVP  Nutrition: Diet as tolerated  Prophylaxis: DVT Prophylaxis with Eliquis,. GI Prophylaxis.   Restraints: None  PT/OT eval and  treat. OOB when appropriate.   Lines/Tubes: Not able to place Foley left subclavian 01/13/2018 central line. ADVANCE DIRECTIVE: DNR/DNI FAMILY  DISCUSSION: Spoke with the son at length Quality Care: PPI, DVT prophylaxis, HOB elevated, Infection control all reviewed and addressed. Events and notes from last 24 hours reviewed. Care plan discussed on multidisciplinary rounds CC TIME: 32 minutes excluding procedures.   HPI: Gregory Tyler  is a 77 y.o. male with a known history of chronic systolic CHF, diabetes type 2, paroxysmal atrial fibrillation, coronary artery disease, gastric ulcer, hypertension, hyperlipidemia who currently resides in a skilled nursing facility brought from there for altered mental status.  Patient son states that his father was in usual state of health yesterday however today he was confused and lethargic.  Patient noted to be hypotensive, noted to have acute renal failure, noted to have elevated liver function test.  Also noticed to have UTI. Patient was transferred to ICU as patient required low-dose dopamine - Already been seen by renal consult - Left subclavian central line was placed in the ICU  Subjective: Doing well no significant events overnight,  mild worsening in creatinine 4.78 but good urine out put TSH was 10, free T4 0.70 -LR stopped by renal due to potassium content - LFT is going up - Started on midodrine  Significant event: 01/13/2018: Admission, subclavian line placement, dopamine  [] The patient is critically ill on     [] Mechanical ventilation [] Pressors  [] BiPAP []    MEDICATIONS:   allopurinol 300 mg Daily  apixaban 2.5 mg Q12H  insulin aspart 0-9 Units TID WC  levothyroxine 12.5 mcg Daily  midodrine 5 mg TID WC  OLANZapine 2.5 mg QHS  [START ON 01/17/2018] rosuvastatin 20 mg Daily  senna-docusate 2 tablet BID    Vital Signs:   BP (!) 95/54   Pulse 64   Temp 98.3 F (36.8 C)   Resp 10   Ht 5' 8"  (1.727 m)   Wt 67.5 kg   SpO2 100%   BMI 22.63 kg/m       Prehospital Care Cardiac Rhythm: Normal sinus rhythm, Heart block Heart Block Type: 1st degree AVB  Temp (24hrs),  Avg:98.4 F (36.9 C), Min:98.2 F (36.8 C), Max:98.8 F (37.1 C)   Intake/Output:Last shift:      09/26 0701 - 09/26 1900 In: 200 [P.O.:200] Out: - Last 3 shifts: 09/24 1901 - 09/26 0700 In: 1102.6 [P.O.:480; I.V.:303.8] Out: 2450 [Urine:2450]  Intake/Output Summary (Last 24 hours) at 01/16/2018 1237 Last data filed at 01/16/2018 1016 Gross per 24 hour  Intake 580.5 ml  Output 1800 ml  Net -1219.5 ml    Objective:   Physical Exam:  General: comfortable, no acute distress HEENT: pupils reactive, sclera anicteric, EOM intact Neck: No adenopathy or thyroid swelling, no lymphadenopathy or JVD, supple CVS: S1S2 no murmurs RS: Mod AE bilaterally, Abd: soft, non tender, no hepatosplenomegaly Neuro: non focal, awake, alert Extrm: Heal dressings sacral decub    CBC w/Diff Recent Labs  Lab 01/14/18 0349 01/15/18 0520 01/16/18 0513  HGB 10.3* 9.5* 9.0*  HCT 30.4* 27.4* 25.7*  WBC 18.6* 12.9* 7.9  PLT 368 294 241     Chemistry Recent Labs  Lab 01/14/18 0349 01/14/18 0917 01/15/18 0520 01/16/18 0513  NA 136  --  137 140  K 5.8* 5.3* 4.9 4.8  CL 105  --  107 108  CO2 19*  --  21* 21*  BUN 85*  --  87* 84*  CREATININE 3.75*  --  4.23* 4.78*  GLUCOSE 165*  --  134* 150*    Hepatic Function Latest Ref Rng & Units 01/16/2018 01/15/2018 01/14/2018  Total Protein 6.5 - 8.1 g/dL 5.3(L) 5.4(L) 6.2(L)  Albumin 3.5 - 5.0 g/dL 2.0(L) 2.3(L) 2.5(L)  AST 15 - 41 U/L 1,136(H) 94(H) 188(H)  ALT 0 - 44 U/L 685(H) 152(H) 225(H)  Alk Phosphatase 38 - 126 U/L 443(H) 234(H) 296(H)  Total Bilirubin 0.3 - 1.2 mg/dL 0.5 0.6 0.6  Bilirubin, Direct 0.0 - 0.2 mg/dL - 0.2 -     Lactic Acid    Component Value Date/Time   LATICACIDVEN 2.1 (HH) 01/14/2018 0349     Micro  @MICRO @ Recent Results (from the past 720 hour(s))  Blood culture (routine x 2)     Status: None (Preliminary result)   Collection Time: 01/13/18  1:39 PM  Result Value Ref Range Status   Specimen Description RIGHT  ANTECUBITAL  Final   Special Requests   Final    BOTTLES DRAWN AEROBIC AND ANAEROBIC Blood Culture adequate volume   Culture   Final    NO GROWTH 3 DAYS Performed at [facility], [address], [place], [place] [address]    Report Status PENDING  Incomplete  Blood culture (routine x 2)     Status: None (Preliminary result)   Collection Time: 01/13/18  1:39 PM  Result Value Ref Range Status   Specimen Description LEFT ANTECUBITAL  Final   Special Requests   Final    BOTTLES DRAWN AEROBIC AND ANAEROBIC Blood Culture results may not be optimal due to an excessive volume of blood received in culture bottles   Culture   Final    NO GROWTH 3 DAYS Performed at [facility], [address], [place], [place] [address]    Report Status PENDING  Incomplete  Urine Culture     Status: Abnormal   Collection Time: 01/13/18  1:39 PM  Result Value Ref Range Status   Specimen Description   Final    URINE, RANDOM Performed at [facility], [address] [place], [place] [address]    Special Requests   Final    NONE Performed at [facility], [address] [place], [place] [address]    Culture >=100,000 COLONIES/mL PSEUDOMONAS AERUGINOSA (A)  Final   Report Status 01/16/2018 FINAL  Final   Organism ID, Bacteria PSEUDOMONAS AERUGINOSA (A)  Final      Susceptibility   Pseudomonas aeruginosa - MIC*    CEFTAZIDIME 4 SENSITIVE Sensitive     CIPROFLOXACIN <=0.25 SENSITIVE Sensitive     GENTAMICIN <=1 SENSITIVE Sensitive     IMIPENEM 1 SENSITIVE Sensitive     PIP/TAZO <=4 SENSITIVE Sensitive     CEFEPIME 2 SENSITIVE Sensitive     * >=100,000 COLONIES/mL PSEUDOMONAS AERUGINOSA  MRSA PCR Screening     Status: None   Collection Time: 01/13/18  5:13 PM  Result Value Ref Range Status   MRSA by PCR NEGATIVE NEGATIVE Final    Comment:        The GeneXpert MRSA Assay (FDA approved for NASAL specimens only), is one component of a comprehensive MRSA  colonization surveillance program. It is not intended to diagnose MRSA infection nor to guide or monitor treatment for MRSA infections. Performed at [facility], [address] [place], [place] [address]      ABG No results found for: PHART, PCO2ART, PO2ART, HCO3, TCO2, ACIDBASEDEF, O2SAT   Cardiac Enzymes Recent Labs  01/13/18 1339 01/13/18 1951 01/14/18 0349 01/15/18 0520  CKTOTAL  --   --   --  54  TROPONINI 0.04* 0.04* 0.05*  --       Coagulation Lab Results  Component Value Date   INR 1.75 11/19/2017     Radiology Results Reviewed by me and compared with previous imaging studies Ct Head Wo Contrast  Result Date: 01/13/2018 CLINICAL DATA:  Altered level consciousness beginning this morning. History of hypertension, hyperlipidemia, recurrent prostate cancer. EXAM: CT HEAD WITHOUT CONTRAST TECHNIQUE: Contiguous axial images were obtained from the base of the skull through the vertex without intravenous contrast. COMPARISON:  None. FINDINGS: BRAIN: No intraparenchymal hemorrhage, mass effect nor midline shift. The ventricles and sulci are normal for age. Patchy to confluent supratentorial white matter hypodensities. Small area of focal blurring RIGHT posterior frontal gray-white matter junction (series 5, image 14). No abnormal extra-axial fluid collections. Basal cisterns are patent. VASCULAR: Moderate calcific atherosclerosis of the carotid siphons. SKULL: No skull fracture. No significant scalp soft tissue swelling. SINUSES/ORBITS: Paranasal sinuses are well aerated. Mastoid air cells are well aerated.The included ocular globes and orbital contents are non-suspicious. OTHER: None. IMPRESSION: 1. 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 2. Moderate to severe chronic small vessel ischemic changes. Electronically Signed   By: Elon Alas M.D.   On: 01/13/2018 14:36   US Renal  Result Date: 01/14/2018 CLINICAL DATA:  77 year old male. Renal  failure. Initial encounter. EXAM: RENAL / URINARY TRACT ULTRASOUND COMPLETE COMPARISON:  11/15/2017 CT. FINDINGS: Right Kidney: Length: 10 cm. Echogenicity within normal limits. No mass or hydronephrosis visualized. Left Kidney: Length: 9.1 cm. Echogenicity within normal limits. No hydronephrosis. Nonobstructing renal calculi measure up to 5 mm. Evaluation limited although no mass identified. Bladder: Decompressed by Foley catheter IMPRESSION: 1. No hydronephrosis. 2. Nonobstructing left renal calculi measuring up to 5 mm. Electronically Signed   By: Genia Del M.D.   On: 01/14/2018 10:27   Dg Chest Port 1 View  Result Date: 01/16/2018 CLINICAL DATA:  Shortness of Breath EXAM: PORTABLE CHEST 1 VIEW COMPARISON:  01/15/2018 FINDINGS: Cardiac shadow is stable. Left subclavian central venous line is again seen and stable. The lungs are well aerated bilaterally without focal infiltrate or sizable effusion. No acute bony abnormality is noted. Postoperative changes in the upper abdomen are seen. IMPRESSION: No acute abnormality noted. Electronically Signed   By: Inez Catalina M.D.   On: 01/16/2018 09:21   Dg Chest Port 1 View  Result Date: 01/15/2018 CLINICAL DATA:  Shortness of breath EXAM: PORTABLE CHEST 1 VIEW COMPARISON:  January 13, 2018 FINDINGS: The heart size and mediastinal contours are stable. Left central venous line is unchanged. Both lungs are clear. The visualized skeletal structures are stable. IMPRESSION: No active cardiopulmonary disease. Electronically Signed   By: Abelardo Diesel M.D.   On: 01/15/2018 10:45   Dg Chest Port 1 View  Result Date: 01/13/2018 CLINICAL DATA:  Central line placement EXAM: PORTABLE CHEST 1 VIEW COMPARISON:  Chest x-ray from earlier same day. FINDINGS: LEFT subclavian central line in place with tip positioned at the level of the mid/upper SVC. Heart size and mediastinal contours are within normal limits. Lungs are clear. No pleural effusion or pneumothorax seen.  IMPRESSION: LEFT subclavian central line placement with tip positioned at the level of the mid/upper SVC. No pneumothorax seen. Electronically Signed   By: Franki Cabot M.D.   On: 01/13/2018 18:42   Dg Abdomen Acute W/chest  Result Date: 01/13/2018 CLINICAL  DATA:  Abdominal pain with altered mental status EXAM: DG ABDOMEN ACUTE W/ 1V CHEST COMPARISON:  Chest radiograph Sep 11, 2017; chest CT November 15, 2017 FINDINGS: PA chest: Lungs are clear. Heart size and pulmonary vascularity are normal. No adenopathy. There is aortic atherosclerosis. There are surgical clips in the upper abdomen. Supine and upright abdomen: There is fairly diffuse stool throughout the colon. There is no bowel dilatation or air-fluid level to suggest bowel obstruction. No free air. There is postoperative change in the lower lumbar region. There are multiple surgical clips in the upper abdomen. There are also surgical clips in the pelvis. IMPRESSION: Multiple areas of postoperative change in the abdomen pelvis. Diffuse stool throughout much of the colon. No evident bowel obstruction or free air. No lung edema or consolidation. There is aortic atherosclerosis. Aortic Atherosclerosis (ICD10-I70.0). Electronically Signed   By: Lowella Grip III M.D.   On: 01/13/2018 14:27   US Abdomen Limited Ruq  Result Date: 01/14/2018 CLINICAL DATA:  77 year old male with abnormal liver function studies. Initial encounter. EXAM: ULTRASOUND ABDOMEN LIMITED RIGHT UPPER QUADRANT COMPARISON:  11/15/2017 CT. FINDINGS: Gallbladder: Prior cholecystectomy. Common bile duct: Diameter: 4 mm Liver: Liver of slight increased echogenicity consistent with fatty infiltration and/or hepatocellular disease. No focal hepatic lesion noted. Portal vein is patent on color Doppler imaging with normal direction of blood flow towards the liver. IMPRESSION: 1. Post cholecystectomy. 2. Common bile duct of normal size. 3. Liver of slight increased echogenicity possibly related  to result of fatty infiltration or hepatocellular disease. No focal hepatic lesion noted. Portal vein patent. Electronically Signed   By: Genia Del M.D.   On: 01/14/2018 10:30      ECHO Study Conclusions  - Left ventricle: The cavity size was normal. Systolic function was   normal. The estimated ejection fraction was in the range of 55%   to 60%. Wall motion was normal; there were no regional wall   motion abnormalities. Features are consistent with a pseudonormal   left ventricular filling pattern, with concomitant abnormal   relaxation and increased filling pressure (grade 2 diastolic   dysfunction). - Aortic valve: Peak velocity (S): 192 cm/s. Mean gradient (S): 8   mm Hg. - Mitral valve: There was mild regurgitation. - Left atrium: The atrium was mildly dilated. - Right ventricle: Systolic function was normal. - Pulmonary arteries: Systolic pressure was mildly elevated. PA   peak pressure: 35 mm Hg (S).   01/16/2018   The patient is: [x]  acutely ill Risk of deterioration: []  moderate   [x]  critically ill  []  high   [x] See my orders for details  My assessment, plan of care, findings, medications, side effects etc were discussed with: [x] nursing [] PT/OT   [x] respiratory therapy [] Dr.  [] family []    Lethea Killings, M.D. Pulmonary Critical Care & Sleep Medicine

## 2018-01-16 NOTE — Progress Notes (Signed)
[place] at [facility]                                                                                                                                                                                  Patient Demographics   Gregory Tyler, is a 77 y.o. male, DOB - 15-Nov-1940, YWV:[id number]  Admit date - 01/13/2018   Admitting Physician Dustin Flock, MD  Outpatient Primary MD for the patient is [name], MD   LOS - 3  Subjective:  Patient's renal function slightly worse liver function worsening    Review of Systems:   CONSTITUTIONAL: No documented fever. No fatigue, weakness. No weight gain, no weight loss.  EYES: No blurry or double vision.  ENT: No tinnitus. No postnasal drip. No redness of the oropharynx.  RESPIRATORY: No cough, no wheeze, no hemoptysis. No dyspnea.  CARDIOVASCULAR: No chest pain. No orthopnea. No palpitations. No syncope.  GASTROINTESTINAL: No nausea, no vomiting or diarrhea. No abdominal pain. No melena or hematochezia.  GENITOURINARY: No dysuria or hematuria.  ENDOCRINE: No polyuria or nocturia. No heat or cold intolerance.  HEMATOLOGY: No anemia. No bruising. No bleeding.  INTEGUMENTARY: No rashes. No lesions.  MUSCULOSKELETAL: No arthritis. No swelling. No gout.  Complains of foot pain NEUROLOGIC: No numbness, tingling, or ataxia. No seizure-type activity.  PSYCHIATRIC: No anxiety. No insomnia. No ADD.    Vitals:   Vitals:   01/16/18 1200 01/16/18 1230 01/16/18 1300 01/16/18 1318  BP: 102/76 101/60 (!) 85/44 110/66  Pulse: 82 97 71 75  Resp: 18 17 15 18   Temp:      TempSrc:      SpO2: 100% 100% 100% 100%  Weight:      Height:        Wt Readings from Last 3 Encounters:  01/13/18 67.5 kg  01/08/18 67.6 kg  12/19/17 68.8 kg     Intake/Output Summary (Last 24 hours) at 01/16/2018 1454 Last data filed at 01/16/2018 1422 Gross per 24 hour  Intake 1716.56 ml  Output 2350 ml  Net -633.44 ml    Physical  Exam:   GENERAL: Pleasant-appearing in no apparent distress.  HEAD, EYES, EARS, NOSE AND THROAT: Atraumatic, normocephalic. Extraocular muscles are intact. Pupils equal and reactive to light. Sclerae anicteric. No conjunctival injection. No oro-pharyngeal erythema.  NECK: Supple. There is no jugular venous distention. No bruits, no lymphadenopathy, no thyromegaly.  HEART: Regular rate and rhythm,. No murmurs, no rubs, no clicks.  LUNGS: Clear to auscultation bilaterally. No rales or rhonchi. No wheezes.  ABDOMEN: Soft, flat, nontender, nondistended. Has good bowel sounds. No hepatosplenomegaly appreciated.  EXTREMITIES: No evidence of any cyanosis, clubbing, or  peripheral edema.  +2 pedal and radial pulses bilaterally.  NEUROLOGIC: The patient is alert, awake, and oriented x3 with no focal motor or sensory deficits appreciated bilaterally.  SKIN: Moist and warm with no rashes appreciated.  Psych: Not anxious, depressed LN: No inguinal LN enlargement    Antibiotics   Anti-infectives (From admission, onward)   Start     Dose/Rate Route Frequency Ordered Stop   01/16/18 2200  ceFEPIme (MAXIPIME) 2 g in sodium chloride 0.9 % 100 mL IVPB     2 g 200 mL/hr over 30 Minutes Intravenous Every 24 hours 01/16/18 1251     01/15/18 1200  vancomycin (VANCOCIN) IVPB 1000 mg/200 mL premix  Status:  Discontinued     1,000 mg 200 mL/hr over 60 Minutes Intravenous Every 48 hours 01/14/18 1135 01/15/18 1022   01/15/18 1000  vancomycin (VANCOCIN) IVPB 750 mg/150 ml premix  Status:  Discontinued     750 mg 150 mL/hr over 60 Minutes Intravenous Every 48 hours 01/14/18 0839 01/14/18 1135   01/14/18 1000  vancomycin (VANCOCIN) IVPB 750 mg/150 ml premix  Status:  Discontinued     750 mg 150 mL/hr over 60 Minutes Intravenous Every 48 hours 01/13/18 1652 01/14/18 0839   01/14/18 0600  vancomycin (VANCOCIN) IVPB 750 mg/150 ml premix  Status:  Discontinued     750 mg 150 mL/hr over 60 Minutes Intravenous Every 48  hours 01/13/18 1548 01/13/18 1652   01/13/18 2200  piperacillin-tazobactam (ZOSYN) IVPB 3.375 g  Status:  Discontinued     3.375 g 12.5 mL/hr over 240 Minutes Intravenous Every 12 hours 01/13/18 1548 01/16/18 1238   01/13/18 1445  vancomycin (VANCOCIN) IVPB 1000 mg/200 mL premix     1,000 mg 200 mL/hr over 60 Minutes Intravenous  Once 01/13/18 1443 01/13/18 1624   01/13/18 1445  piperacillin-tazobactam (ZOSYN) IVPB 3.375 g     3.375 g 100 mL/hr over 30 Minutes Intravenous  Once 01/13/18 1443 01/13/18 1619      Medications   Scheduled Meds: . allopurinol  300 mg Oral Daily  . apixaban  2.5 mg Oral Q12H  . insulin aspart  0-9 Units Subcutaneous TID WC  . levothyroxine  12.5 mcg Intravenous Daily  . midodrine  5 mg Oral TID WC  . OLANZapine  2.5 mg Oral QHS  . [START ON 01/17/2018] rosuvastatin  20 mg Oral Daily  . senna-docusate  2 tablet Oral BID   Continuous Infusions: . sodium chloride 75 mL/hr at 01/16/18 1326  . ceFEPime (MAXIPIME) IV    . DOPamine 6 mcg/kg/min (01/16/18 1326)   PRN Meds:.ondansetron **OR** ondansetron (ZOFRAN) IV, polyethylene glycol   Data Review:   Micro Results Recent Results (from the past 240 hour(s))  Blood culture (routine x 2)     Status: None (Preliminary result)   Collection Time: 01/13/18  1:39 PM  Result Value Ref Range Status   Specimen Description RIGHT ANTECUBITAL  Final   Special Requests   Final    BOTTLES DRAWN AEROBIC AND ANAEROBIC Blood Culture adequate volume   Culture   Final    NO GROWTH 3 DAYS Performed at [facility], [address] [place], [place] [address]    Report Status PENDING  Incomplete  Blood culture (routine x 2)     Status: None (Preliminary result)   Collection Time: 01/13/18  1:39 PM  Result Value Ref Range Status   Specimen Description LEFT ANTECUBITAL  Final   Special Requests   Final  BOTTLES DRAWN AEROBIC AND ANAEROBIC Blood Culture results may not be optimal due to an excessive volume of  blood received in culture bottles   Culture   Final    NO GROWTH 3 DAYS Performed at [facility], [address] [place], [place] [address]    Report Status PENDING  Incomplete  Urine Culture     Status: Abnormal   Collection Time: 01/13/18  1:39 PM  Result Value Ref Range Status   Specimen Description   Final    URINE, RANDOM Performed at [facility], [address], [place], [place] [address]    Special Requests   Final    NONE Performed at [facility], [address] [place], [place] [address]    Culture >=100,000 COLONIES/mL PSEUDOMONAS AERUGINOSA (A)  Final   Report Status 01/16/2018 FINAL  Final   Organism ID, Bacteria PSEUDOMONAS AERUGINOSA (A)  Final      Susceptibility   Pseudomonas aeruginosa - MIC*    CEFTAZIDIME 4 SENSITIVE Sensitive     CIPROFLOXACIN <=0.25 SENSITIVE Sensitive     GENTAMICIN <=1 SENSITIVE Sensitive     IMIPENEM 1 SENSITIVE Sensitive     PIP/TAZO <=4 SENSITIVE Sensitive     CEFEPIME 2 SENSITIVE Sensitive     * >=100,000 COLONIES/mL PSEUDOMONAS AERUGINOSA  MRSA PCR Screening     Status: None   Collection Time: 01/13/18  5:13 PM  Result Value Ref Range Status   MRSA by PCR NEGATIVE NEGATIVE Final    Comment:        The GeneXpert MRSA Assay (FDA approved for NASAL specimens only), is one component of a comprehensive MRSA colonization surveillance program. It is not intended to diagnose MRSA infection nor to guide or monitor treatment for MRSA infections. Performed at [facility], [address] [place],  [address]     Radiology Reports Ct Head Wo Contrast  Result Date: 01/13/2018 CLINICAL DATA:  Altered level consciousness beginning this morning. History of hypertension, hyperlipidemia, recurrent prostate cancer. EXAM: CT HEAD WITHOUT CONTRAST TECHNIQUE: Contiguous axial images were obtained from the base of the skull through the vertex without intravenous contrast. COMPARISON:  None.  FINDINGS: BRAIN: No intraparenchymal hemorrhage, mass effect nor midline shift. The ventricles and sulci are normal for age. Patchy to confluent supratentorial white matter hypodensities. Small area of focal blurring RIGHT posterior frontal gray-white matter junction (series 5, image 14). No abnormal extra-axial fluid collections. Basal cisterns are patent. VASCULAR: Moderate calcific atherosclerosis of the carotid siphons. SKULL: No skull fracture. No significant scalp soft tissue swelling. SINUSES/ORBITS: Paranasal sinuses are well aerated. Mastoid air cells are well aerated.The included ocular globes and orbital contents are non-suspicious. OTHER: None. IMPRESSION: 1. 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 2. Moderate to severe chronic small vessel ischemic changes. Electronically Signed   By: Elon Alas M.D.   On: 01/13/2018 14:36   US Renal  Result Date: 01/14/2018 CLINICAL DATA:  77 year old male. Renal failure. Initial encounter. EXAM: RENAL / URINARY TRACT ULTRASOUND COMPLETE COMPARISON:  11/15/2017 CT. FINDINGS: Right Kidney: Length: 10 cm. Echogenicity within normal limits. No mass or hydronephrosis visualized. Left Kidney: Length: 9.1 cm. Echogenicity within normal limits. No hydronephrosis. Nonobstructing renal calculi measure up to 5 mm. Evaluation limited although no mass identified. Bladder: Decompressed by Foley catheter IMPRESSION: 1. No hydronephrosis. 2. Nonobstructing left renal calculi measuring up to 5 mm. Electronically Signed   By: Genia Del M.D.   On: 01/14/2018 10:27   Dg  Chest Port 1 View  Result Date: 01/16/2018 CLINICAL DATA:  Shortness of Breath EXAM: PORTABLE CHEST 1 VIEW COMPARISON:  01/15/2018 FINDINGS: Cardiac shadow is stable. Left subclavian central venous line is again seen and stable. The lungs are well aerated bilaterally without focal infiltrate or sizable effusion. No acute bony abnormality is noted. Postoperative changes in the upper  abdomen are seen. IMPRESSION: No acute abnormality noted. Electronically Signed   By: Inez Catalina M.D.   On: 01/16/2018 09:21   Dg Chest Port 1 View  Result Date: 01/15/2018 CLINICAL DATA:  Shortness of breath EXAM: PORTABLE CHEST 1 VIEW COMPARISON:  January 13, 2018 FINDINGS: The heart size and mediastinal contours are stable. Left central venous line is unchanged. Both lungs are clear. The visualized skeletal structures are stable. IMPRESSION: No active cardiopulmonary disease. Electronically Signed   By: Abelardo Diesel M.D.   On: 01/15/2018 10:45   Dg Chest Port 1 View  Result Date: 01/13/2018 CLINICAL DATA:  Central line placement EXAM: PORTABLE CHEST 1 VIEW COMPARISON:  Chest x-ray from earlier same day. FINDINGS: LEFT subclavian central line in place with tip positioned at the level of the mid/upper SVC. Heart size and mediastinal contours are within normal limits. Lungs are clear. No pleural effusion or pneumothorax seen. IMPRESSION: LEFT subclavian central line placement with tip positioned at the level of the mid/upper SVC. No pneumothorax seen. Electronically Signed   By: Franki Cabot M.D.   On: 01/13/2018 18:42   Dg Abdomen Acute W/chest  Result Date: 01/13/2018 CLINICAL DATA:  Abdominal pain with altered mental status EXAM: DG ABDOMEN ACUTE W/ 1V CHEST COMPARISON:  Chest radiograph Sep 11, 2017; chest CT November 15, 2017 FINDINGS: PA chest: Lungs are clear. Heart size and pulmonary vascularity are normal. No adenopathy. There is aortic atherosclerosis. There are surgical clips in the upper abdomen. Supine and upright abdomen: There is fairly diffuse stool throughout the colon. There is no bowel dilatation or air-fluid level to suggest bowel obstruction. No free air. There is postoperative change in the lower lumbar region. There are multiple surgical clips in the upper abdomen. There are also surgical clips in the pelvis. IMPRESSION: Multiple areas of postoperative change in the abdomen  pelvis. Diffuse stool throughout much of the colon. No evident bowel obstruction or free air. No lung edema or consolidation. There is aortic atherosclerosis. Aortic Atherosclerosis (ICD10-I70.0). Electronically Signed   By: Lowella Grip III M.D.   On: 01/13/2018 14:27   US Abdomen Limited Ruq  Result Date: 01/14/2018 CLINICAL DATA:  77 year old male with abnormal liver function studies. Initial encounter. EXAM: ULTRASOUND ABDOMEN LIMITED RIGHT UPPER QUADRANT COMPARISON:  11/15/2017 CT. FINDINGS: Gallbladder: Prior cholecystectomy. Common bile duct: Diameter: 4 mm Liver: Liver of slight increased echogenicity consistent with fatty infiltration and/or hepatocellular disease. No focal hepatic lesion noted. Portal vein is patent on color Doppler imaging with normal direction of blood flow towards the liver. IMPRESSION: 1. Post cholecystectomy. 2. Common bile duct of normal size. 3. Liver of slight increased echogenicity possibly related to result of fatty infiltration or hepatocellular disease. No focal hepatic lesion noted. Portal vein patent. Electronically Signed   By: Genia Del M.D.   On: 01/14/2018 10:30     CBC Recent Labs  Lab 01/13/18 1339 01/14/18 0349 01/15/18 0520 01/16/18 0513  WBC 15.6* 18.6* 12.9* 7.9  HGB 10.3* 10.3* 9.5* 9.0*  HCT 30.8* 30.4* 27.4* 25.7*  PLT 389 368 294 241  MCV 94.2 93.2 92.7 92.8  MCH 31.4 31.7  32.0 32.5  MCHC 33.4 34.0 34.5 35.0  RDW 16.0* 15.6* 15.7* 15.2*  LYMPHSABS 1.3 1.4 1.7 1.6  MONOABS 0.6 0.8 0.9 0.5  EOSABS 0.0 0.0 0.0 0.1  BASOSABS 0.0 0.0 0.0 0.0    Chemistries  Recent Labs  Lab 01/13/18 1339 01/13/18 1951 01/14/18 0349 01/14/18 0917 01/15/18 0520 01/16/18 0513  NA 136 135 136  --  137 140  K 5.4* 5.5* 5.8* 5.3* 4.9 4.8  CL 105 108 105  --  107 108  CO2 20* 18* 19*  --  21* 21*  GLUCOSE 97 91 165*  --  134* 150*  BUN 89* 84* 85*  --  87* 84*  CREATININE 3.86* 3.68* 3.75*  --  4.23* 4.78*  CALCIUM 7.8* 7.3* 7.5*  --   7.1* 7.0*  MG  --   --  1.9  --  1.9 1.9  AST 364* 275* 188*  --  94* 1,136*  ALT 256* 239* 225*  --  152* 685*  ALKPHOS 317* 287* 296*  --  234* 443*  BILITOT 0.7 0.8 0.6  --  0.6 0.5   ------------------------------------------------------------------------------------------------------------------ estimated creatinine clearance is 12.2 mL/min (A) (by C-G formula based on SCr of 4.78 mg/dL (H)). ------------------------------------------------------------------------------------------------------------------ No results for input(s): HGBA1C in the last 72 hours. ------------------------------------------------------------------------------------------------------------------ No results for input(s): CHOL, HDL, LDLCALC, TRIG, CHOLHDL, LDLDIRECT in the last 72 hours. ------------------------------------------------------------------------------------------------------------------ Recent Labs    01/13/18 1951  TSH 10.319*   ------------------------------------------------------------------------------------------------------------------ No results for input(s): VITAMINB12, FOLATE, FERRITIN, TIBC, IRON, RETICCTPCT in the last 72 hours.  Coagulation profile No results for input(s): INR, PROTIME in the last 168 hours.  No results for input(s): DDIMER in the last 72 hours.  Cardiac Enzymes Recent Labs  Lab 01/13/18 1339 01/13/18 1951 01/14/18 0349  TROPONINI 0.04* 0.04* 0.05*   ------------------------------------------------------------------------------------------------------------------ Invalid input(s): POCBNP    Assessment & Plan   IMPRESSION AND PLAN: Patient 77 year old nursing home due to altered mental status  1.  Acute encephalopathy due to sepsis Mental status improved  2.  Sepsis due to UTI with Pseudomonas continue broad-spectrum antibiotics as well as pressor  3.  Acute renal failure likely due to ATN renal function continue to get worse and continue to  monitor  4.  Elevated LFTs likely due to shock liver liver function worse today  5.  Chronic systolic CHF blood pressure low hold all medications that could lower blood pressure including diuretic repeat echo shows normal EF  6.  Diabetes type 2 continue sliding-scale insulin  7.  Hypertension currently hypotensive hold blood pressure meds  8.  Miscellaneous patient on Eliquis which we will continue      Code Status Orders  (From admission, onward)         Start     Ordered   01/13/18 1713  Do not attempt resuscitation (DNR)  Continuous    Question Answer Comment  In the event of cardiac or respiratory ARREST Do not call a "code blue"   In the event of cardiac or respiratory ARREST Do not perform Intubation, CPR, defibrillation or ACLS   In the event of cardiac or respiratory ARREST Use medication by any route, position, wound care, and other measures to relive pain and suffering. May use oxygen, suction and manual treatment of airway obstruction as needed for comfort.   Comments RN may pronounce      01/13/18 1712        Code Status History    Date Active Date Inactive  Code Status Order ID Comments User Context   12/19/2017 1932 12/20/2017 2151 DNR [id number]  [name], MD Inpatient   11/17/2017 1357 11/19/2017 1603 DNR [id number]  Hermelinda Dellen, DO Inpatient   11/15/2017 1550 11/17/2017 1357 Full Code [id number]  Loletha Grayer, MD ED   09/11/2017 1951 09/15/2017 1603 Full Code [id number]  Bettey Costa, MD ED   05/11/2015 1307 05/13/2015 1256 Full Code [id number]  Aldean Jewett, MD Inpatient   04/25/2015 0149 04/27/2015 1741 Full Code [id number]  Hillary Bow, MD ED           Consults pulmonary critical care, nephrology  DVT Prophylaxis Eliquis  Lab Results  Component Value Date   PLT 241 01/16/2018     Time Spent in minutes   62min  Greater than 50% of time spent in care coordination and counseling patient regarding the condition and plan of care.   Dustin Flock M.D on 01/16/2018 at 2:54 PM  Between 7am to 6pm - Pager - [phone]  After 6pm go to [email or web address] - Proofreader  [employer]   Office  [phone]

## 2018-01-17 ENCOUNTER — Other Ambulatory Visit (INDEPENDENT_AMBULATORY_CARE_PROVIDER_SITE_OTHER): Payer: Self-pay | Admitting: Nurse Practitioner

## 2018-01-17 DIAGNOSIS — R001 Bradycardia, unspecified: Secondary | ICD-10-CM

## 2018-01-17 DIAGNOSIS — R6520 Severe sepsis without septic shock: Secondary | ICD-10-CM

## 2018-01-17 DIAGNOSIS — B965 Pseudomonas (aeruginosa) (mallei) (pseudomallei) as the cause of diseases classified elsewhere: Secondary | ICD-10-CM

## 2018-01-17 DIAGNOSIS — G9340 Encephalopathy, unspecified: Secondary | ICD-10-CM

## 2018-01-17 DIAGNOSIS — I959 Hypotension, unspecified: Secondary | ICD-10-CM

## 2018-01-17 LAB — COMPREHENSIVE METABOLIC PANEL
ALBUMIN: 1.8 g/dL — AB (ref 3.5–5.0)
ALK PHOS: 303 U/L — AB (ref 38–126)
ALT: 412 U/L — ABNORMAL HIGH (ref 0–44)
ANION GAP: 8 (ref 5–15)
AST: 439 U/L — ABNORMAL HIGH (ref 15–41)
BUN: 80 mg/dL — AB (ref 8–23)
CALCIUM: 6.9 mg/dL — AB (ref 8.9–10.3)
CO2: 20 mmol/L — ABNORMAL LOW (ref 22–32)
Chloride: 113 mmol/L — ABNORMAL HIGH (ref 98–111)
Creatinine, Ser: 4.77 mg/dL — ABNORMAL HIGH (ref 0.61–1.24)
GFR calc non Af Amer: 11 mL/min — ABNORMAL LOW (ref >60)
GFR, EST AFRICAN AMERICAN: 12 mL/min — AB (ref >60)
GLUCOSE: 142 mg/dL — AB (ref 70–99)
POTASSIUM: 4.5 mmol/L (ref 3.5–5.1)
Sodium: 141 mmol/L (ref 135–145)
Total Bilirubin: 0.5 mg/dL (ref 0.3–1.2)
Total Protein: 4.6 g/dL — ABNORMAL LOW (ref 6.5–8.1)

## 2018-01-17 LAB — GLUCOSE, CAPILLARY
Glucose-Capillary: 135 mg/dL — ABNORMAL HIGH (ref 70–99)
Glucose-Capillary: 113 mg/dL — ABNORMAL HIGH (ref 70–99)
Glucose-Capillary: 97 mg/dL (ref 70–99)
Glucose-Capillary: 235 mg/dL — ABNORMAL HIGH (ref 70–99)

## 2018-01-17 MED ORDER — HEPARIN (PORCINE) IN NACL 100-0.45 UNIT/ML-% IJ SOLN
950.0000 [IU]/h | INTRAMUSCULAR | Status: DC
  Administered 2018-01-17 – 2018-01-18 (×2): 950 [IU]/h via INTRAVENOUS
  Administered 2018-01-19: 850 [IU]/h via INTRAVENOUS
  Administered 2018-01-20: 750 [IU]/h via INTRAVENOUS
  Administered 2018-01-21: 850 [IU]/h via INTRAVENOUS
  Filled 2018-01-17 (×4): qty 250

## 2018-01-17 MED ORDER — INSULIN ASPART 100 UNIT/ML ~~LOC~~ SOLN
0.0000 [IU] | Freq: Three times a day (TID) | SUBCUTANEOUS | Status: DC
  Administered 2018-01-17: 3 [IU] via SUBCUTANEOUS
  Administered 2018-01-18: 2 [IU] via SUBCUTANEOUS
  Administered 2018-01-18: 3 [IU] via SUBCUTANEOUS
  Administered 2018-01-19 (×2): 1 [IU] via SUBCUTANEOUS
  Administered 2018-01-20 – 2018-01-22 (×6): 2 [IU] via SUBCUTANEOUS
  Administered 2018-01-23: 1 [IU] via SUBCUTANEOUS
  Administered 2018-01-23: 2 [IU] via SUBCUTANEOUS
  Administered 2018-01-24 – 2018-01-27 (×6): 1 [IU] via SUBCUTANEOUS
  Administered 2018-01-27: 2 [IU] via SUBCUTANEOUS
  Administered 2018-01-28: 1 [IU] via SUBCUTANEOUS
  Administered 2018-01-30: 2 [IU] via SUBCUTANEOUS
  Filled 2018-01-17 (×22): qty 1

## 2018-01-17 MED ORDER — LACTATED RINGERS IV SOLN
INTRAVENOUS | Status: DC
  Administered 2018-01-17: 50 mL/h via INTRAVENOUS
  Administered 2018-01-18 – 2018-01-19 (×3): via INTRAVENOUS

## 2018-01-17 MED ORDER — MIDODRINE HCL 5 MG PO TABS
10.0000 mg | ORAL_TABLET | Freq: Three times a day (TID) | ORAL | Status: DC
  Administered 2018-01-17 – 2018-01-30 (×36): 10 mg via ORAL
  Filled 2018-01-17 (×48): qty 2

## 2018-01-17 MED ORDER — LEVOTHYROXINE SODIUM 100 MCG IV SOLR
25.0000 ug | Freq: Every day | INTRAVENOUS | Status: DC
  Administered 2018-01-17 – 2018-01-20 (×4): 25 ug via INTRAVENOUS
  Filled 2018-01-17 (×4): qty 5

## 2018-01-17 NOTE — Progress Notes (Signed)
[facility], [place] 01/17/18  Subjective:   Patient continues to be critically ill S Creatinine still critically high at 4.79.  Urine output 1750 cc Potassium is low normal range Minimal dose dopamine which is about to be turned off Patient missing breakfast this morning but asking for food   Objective:  Vital signs in last 24 hours:  Temp:  [98.4 F (36.9 C)-98.7 F (37.1 C)] 98.5 F (36.9 C) (09/26 2000) Pulse Rate:  [63-97] 63 (09/27 0600) Resp:  [7-20] 9 (09/27 0600) BP: (85-135)/(44-87) 90/54 (09/27 0600) SpO2:  [88 %-100 %] 100 % (09/27 0600)  Weight change:  Filed Weights   01/13/18 1327  Weight: 67.5 kg    Intake/Output:    Intake/Output Summary (Last 24 hours) at 01/17/2018 1053 Last data filed at 01/17/2018 0600 Gross per 24 hour  Intake 2522.43 ml  Output 1500 ml  Net 1022.43 ml     Physical Exam: General:  Chronically ill-appearing, laying in the bed, thin cachectic  HEENT  dry oral mucous membranes  Neck  supple  Pulm/lungs  decreased breath sounds at bases  CVS/Heart  irregular, tachycardic  Abdomen:   Soft, nondistended,   Extremities:  + dependent peripheral edema, bilateral feet in bandages  Neurologic:  Alert, able to answer few questions,    Basic Metabolic Panel:  Recent Labs  Lab 01/13/18 1951 01/14/18 0349 01/14/18 0917 01/15/18 0520 01/16/18 0513 01/17/18 0558  NA 135 136  --  137 140 141  K 5.5* 5.8* 5.3* 4.9 4.8 4.5  CL 108 105  --  107 108 113*  CO2 18* 19*  --  21* 21* 20*  GLUCOSE 91 165*  --  134* 150* 142*  BUN 84* 85*  --  87* 84* 80*  CREATININE 3.68* 3.75*  --  4.23* 4.78* 4.77*  CALCIUM 7.3* 7.5*  --  7.1* 7.0* 6.9*  MG  --  1.9  --  1.9 1.9  --   PHOS  --  6.5*  --  6.2*  --   --      CBC: Recent Labs  Lab 01/13/18 1339 01/14/18 0349 01/15/18 0520 01/16/18 0513  WBC 15.6* 18.6* 12.9* 7.9  NEUTROABS 13.7* 16.5* 10.3* 5.7  HGB 10.3* 10.3* 9.5* 9.0*  HCT 30.8* 30.4* 27.4*  25.7*  MCV 94.2 93.2 92.7 92.8  PLT 389 368 294 241     No results found for: HEPBSAG, HEPBSAB, HEPBIGM    Microbiology:  Recent Results (from the past 240 hour(s))  Blood culture (routine x 2)     Status: None (Preliminary result)   Collection Time: 01/13/18  1:39 PM  Result Value Ref Range Status   Specimen Description RIGHT ANTECUBITAL  Final   Special Requests   Final    BOTTLES DRAWN AEROBIC AND ANAEROBIC Blood Culture adequate volume   Culture   Final    NO GROWTH 4 DAYS Performed at [facility], [address] [place], [place] [address]    Report Status PENDING  Incomplete  Blood culture (routine x 2)     Status: None (Preliminary result)   Collection Time: 01/13/18  1:39 PM  Result Value Ref Range Status   Specimen Description LEFT ANTECUBITAL  Final   Special Requests   Final    BOTTLES DRAWN AEROBIC AND ANAEROBIC Blood Culture results may not be optimal due to an excessive volume of blood received in culture bottles   Culture   Final    NO GROWTH  4 DAYS Performed at [facility], [address] [place], [place] [address]    Report Status PENDING  Incomplete  Urine Culture     Status: Abnormal   Collection Time: 01/13/18  1:39 PM  Result Value Ref Range Status   Specimen Description   Final    URINE, RANDOM Performed at [facility], [address] [place], [place] [address]    Special Requests   Final    NONE Performed at [facility], [address] [place], [place] [address]    Culture >=100,000 COLONIES/mL PSEUDOMONAS AERUGINOSA (A)  Final   Report Status 01/16/2018 FINAL  Final   Organism ID, Bacteria PSEUDOMONAS AERUGINOSA (A)  Final      Susceptibility   Pseudomonas aeruginosa - MIC*    CEFTAZIDIME 4 SENSITIVE Sensitive     CIPROFLOXACIN <=0.25 SENSITIVE Sensitive     GENTAMICIN <=1 SENSITIVE Sensitive     IMIPENEM 1 SENSITIVE Sensitive     PIP/TAZO <=4 SENSITIVE Sensitive     CEFEPIME 2 SENSITIVE  Sensitive     * >=100,000 COLONIES/mL PSEUDOMONAS AERUGINOSA  MRSA PCR Screening     Status: None   Collection Time: 01/13/18  5:13 PM  Result Value Ref Range Status   MRSA by PCR NEGATIVE NEGATIVE Final    Comment:        The GeneXpert MRSA Assay (FDA approved for NASAL specimens only), is one component of a comprehensive MRSA colonization surveillance program. It is not intended to diagnose MRSA infection nor to guide or monitor treatment for MRSA infections. Performed at [facility], [address] [place], [place] [address]     Coagulation Studies: No results for input(s): LABPROT, INR in the last 72 hours.  Urinalysis: No results for input(s): COLORURINE, LABSPEC, PHURINE, GLUCOSEU, HGBUR, BILIRUBINUR, KETONESUR, PROTEINUR, UROBILINOGEN, NITRITE, LEUKOCYTESUR in the last 72 hours.  Invalid input(s): APPERANCEUR    Imaging: Dg Chest Port 1 View  Result Date: 01/16/2018 CLINICAL DATA:  Shortness of Breath EXAM: PORTABLE CHEST 1 VIEW COMPARISON:  01/15/2018 FINDINGS: Cardiac shadow is stable. Left subclavian central venous line is again seen and stable. The lungs are well aerated bilaterally without focal infiltrate or sizable effusion. No acute bony abnormality is noted. Postoperative changes in the upper abdomen are seen. IMPRESSION: No acute abnormality noted. Electronically Signed   By: Inez Catalina M.D.   On: 01/16/2018 09:21     Medications:   . ceFEPime (MAXIPIME) IV Stopped (01/16/18 2222)  . DOPamine 3.5 mcg/kg/min (01/17/18 0600)  . lactated ringers     . allopurinol  100 mg Oral Daily  . apixaban  2.5 mg Oral Q12H  . insulin aspart  0-9 Units Subcutaneous TID WC  . levothyroxine  25 mcg Intravenous Daily  . midodrine  10 mg Oral TID WC  . OLANZapine  2.5 mg Oral QHS  . senna-docusate  2 tablet Oral BID   [DISCONTINUED] ondansetron **OR** ondansetron (ZOFRAN) IV, polyethylene glycol  Assessment/ Plan:  77 y.o. Caucasian male with medical  problems of chronic systolic congestive heart failure, diabetes type 2, atrial fibrillation, hypertension, history of gastric ulcer, coronary disease, who was admitted to [facility] on 01/13/2018 for evaluation of altered mental status.   1.  Acute renal failure on chronic kidney disease stage III Baseline creatinine 1.30/GFR 51 from December 20, 2017 Admission creatinine 3.86, still critically high at 4.77 The cause of acute renal failure appears to be multifactorial including hypotension, sepsis, likely leading to ATN Renal  u/s shows left non obstructing stone 5 mm; no hydronephrosis No acute indication for dialysis at present. Discussed with patient's son that he may need temporary HD if no improvement in renal function although with his fragile health, he may not be able to tolerate it  2.  UTI and sepsis, Pseudomonas aeuroginosa Patient is currently getting broad-spectrum antibiotics.   3. Hyperkalemia - currently in normal range - may use veltassa if repeat level remains high - Discontinued Lactated ringer due to potassium content     LOS: 4 Alexander Mcauley 9/27/201910:53 AM  [place], [place]  Note: This note was prepared with Dragon dictation. Any transcription errors are unintentional

## 2018-01-17 NOTE — Consult Note (Signed)
Pharmacy Antibiotic Note  Gregory Tyler is a 77 y.o. male admitted on 01/13/2018 with sepsis with suspected urinary source. Vancomycin discontinued 9/25 for pseudomonas in the urine. Pharmacy has been consulted for Cefepime dosing.  Scr 3.86 on admission (baseline around 1.8). Renal function continues to decline.  Plan: Per 9/27 ICU am rounds, will continue Cefepime 2g IV q24h until 10/2.  Height: 5\' 8"  (172.7 cm) Weight: 148 lb 13 oz (67.5 kg) IBW/kg (Calculated) : 68.4  Temp (24hrs), Avg:98.6 F (37 C), Min:98.5 F (36.9 C), Max:98.7 F (37.1 C)  Recent Labs  Lab 01/13/18 1339 01/13/18 1709 01/13/18 1951 01/14/18 0349 01/15/18 0520 01/16/18 0513 01/17/18 0558  WBC 15.6*  --   --  18.6* 12.9* 7.9  --   CREATININE 3.86*  --  3.68* 3.75* 4.23* 4.78* 4.77*  LATICACIDVEN 2.8* 3.2*  --  2.1*  --   --   --     Estimated Creatinine Clearance: 12.2 mL/min (A) (by C-G formula based on SCr of 4.77 mg/dL (H)).    Allergies  Allergen Reactions  . Aspirin     GI Bleeding  . Codeine Other (See Comments)    BLISTERS Other reaction(s): Other (See Comments) BLISTERS   . Codeine Sulfate Other (See Comments)    Other reaction(s): Other (See Comments) BLISTERS BLISTERS   Antimicrobials this admission: Vancomycin 9/23 x 1 dose Zosyn 9/23 >> 9/26 Cefepime 9/26 >> 10/2  Microbiology results: 9/23 BCx: NG 4 days 9/23 Urine Cx: pseudomonas aeruginosa (sensitive to ceftazidime and cefepime) 9/23 MRSA PCR: negative  Thank you for allowing pharmacy to be a part of this patient's care.  Janey Greaser Welby Montminy, PharmD Candidate 01/17/2018 11:30 AM

## 2018-01-17 NOTE — Progress Notes (Signed)
Somnolent, poorly oriented, NAD Coarse tremor Remains on low-dose dopamine  Vitals:   01/17/18 0300 01/17/18 0400 01/17/18 0500 01/17/18 0600  BP: (!) 85/53 (!) 115/55 (!) 86/53 (!) 90/54  Pulse: 63 78 63 63  Resp: (!) 8 19 (!) 7 (!) 9  Temp:      TempSrc:      SpO2: 99% 100% 100% 100%  Weight:      Height:       Poorly oriented.  NAD, tremulous HEENT: NCAT, sclerae white Neck: No JVD noted Chest: Clear anteriorly Cardiac: Regular, no M Abdomen: Soft, NT, NABS Extremities: Warm, no edema Neuro: No focal deficits noted  BMP Latest Ref Rng & Units 01/17/2018 01/16/2018 01/15/2018  Glucose 70 - 99 mg/dL 142(H) 150(H) 134(H)  BUN 8 - 23 mg/dL 80(H) 84(H) 87(H)  Creatinine 0.61 - 1.24 mg/dL 4.77(H) 4.78(H) 4.23(H)  BUN/Creat Ratio 10 - 24 - - -  Sodium 135 - 145 mmol/L 141 140 137  Potassium 3.5 - 5.1 mmol/L 4.5 4.8 4.9  Chloride 98 - 111 mmol/L 113(H) 108 107  CO2 22 - 32 mmol/L 20(L) 21(L) 21(L)  Calcium 8.9 - 10.3 mg/dL 6.9(L) 7.0(L) 7.1(L)    CBC Latest Ref Rng & Units 01/16/2018 01/15/2018 01/14/2018  WBC 3.8 - 10.6 K/uL 7.9 12.9(H) 18.6(H)  Hemoglobin 13.0 - 18.0 g/dL 9.0(L) 9.5(L) 10.3(L)  Hematocrit 40.0 - 52.0 % 25.7(L) 27.4(L) 30.4(L)  Platelets 150 - 440 K/uL 241 294 368   Results for orders placed or performed during the hospital encounter of 01/13/18  Blood culture (routine x 2)     Status: None (Preliminary result)   Collection Time: 01/13/18  1:39 PM  Result Value Ref Range Status   Specimen Description RIGHT ANTECUBITAL  Final   Special Requests   Final    BOTTLES DRAWN AEROBIC AND ANAEROBIC Blood Culture adequate volume   Culture   Final    NO GROWTH 4 DAYS Performed at [facility], [address] [place], [place] [address]    Report Status PENDING  Incomplete  Blood culture (routine x 2)     Status: None (Preliminary result)   Collection Time: 01/13/18  1:39 PM  Result Value Ref Range Status   Specimen Description LEFT ANTECUBITAL  Final    Special Requests   Final    BOTTLES DRAWN AEROBIC AND ANAEROBIC Blood Culture results may not be optimal due to an excessive volume of blood received in culture bottles   Culture   Final    NO GROWTH 4 DAYS Performed at [facility], [address], [place], [place] [address]    Report Status PENDING  Incomplete  Urine Culture     Status: Abnormal   Collection Time: 01/13/18  1:39 PM  Result Value Ref Range Status   Specimen Description   Final    URINE, RANDOM Performed at [facility], [address], [place], [place] [address]    Special Requests   Final    NONE Performed at [facility], [address] [place],  [address]    Culture >=100,000 COLONIES/mL PSEUDOMONAS AERUGINOSA (A)  Final   Report Status 01/16/2018 FINAL  Final   Organism ID, Bacteria PSEUDOMONAS AERUGINOSA (A)  Final      Susceptibility   Pseudomonas aeruginosa - MIC*    CEFTAZIDIME 4 SENSITIVE Sensitive     CIPROFLOXACIN <=0.25 SENSITIVE Sensitive     GENTAMICIN <=1 SENSITIVE Sensitive     IMIPENEM 1 SENSITIVE Sensitive  PIP/TAZO <=4 SENSITIVE Sensitive     CEFEPIME 2 SENSITIVE Sensitive     * >=100,000 COLONIES/mL PSEUDOMONAS AERUGINOSA  MRSA PCR Screening     Status: None   Collection Time: 01/13/18  5:13 PM  Result Value Ref Range Status   MRSA by PCR NEGATIVE NEGATIVE Final    Comment:        The GeneXpert MRSA Assay (FDA approved for NASAL specimens only), is one component of a comprehensive MRSA colonization surveillance program. It is not intended to diagnose MRSA infection nor to guide or monitor treatment for MRSA infections. Performed at [facility], [address] [place], [place] [address]     Anti-infectives (From admission, onward)   Start     Dose/Rate Route Frequency Ordered Stop   01/16/18 2200  ceFEPIme (MAXIPIME) 2 g in sodium chloride 0.9 % 100 mL IVPB     2 g 200 mL/hr over 30 Minutes Intravenous Every 24 hours 01/16/18  1251 01/22/18 2359   01/15/18 1200  vancomycin (VANCOCIN) IVPB 1000 mg/200 mL premix  Status:  Discontinued     1,000 mg 200 mL/hr over 60 Minutes Intravenous Every 48 hours 01/14/18 1135 01/15/18 1022   01/15/18 1000  vancomycin (VANCOCIN) IVPB 750 mg/150 ml premix  Status:  Discontinued     750 mg 150 mL/hr over 60 Minutes Intravenous Every 48 hours 01/14/18 0839 01/14/18 1135   01/14/18 1000  vancomycin (VANCOCIN) IVPB 750 mg/150 ml premix  Status:  Discontinued     750 mg 150 mL/hr over 60 Minutes Intravenous Every 48 hours 01/13/18 1652 01/14/18 0839   01/14/18 0600  vancomycin (VANCOCIN) IVPB 750 mg/150 ml premix  Status:  Discontinued     750 mg 150 mL/hr over 60 Minutes Intravenous Every 48 hours 01/13/18 1548 01/13/18 1652   01/13/18 2200  piperacillin-tazobactam (ZOSYN) IVPB 3.375 g  Status:  Discontinued     3.375 g 12.5 mL/hr over 240 Minutes Intravenous Every 12 hours 01/13/18 1548 01/16/18 1238   01/13/18 1445  vancomycin (VANCOCIN) IVPB 1000 mg/200 mL premix     1,000 mg 200 mL/hr over 60 Minutes Intravenous  Once 01/13/18 1443 01/13/18 1624   01/13/18 1445  piperacillin-tazobactam (ZOSYN) IVPB 3.375 g     3.375 g 100 mL/hr over 30 Minutes Intravenous  Once 01/13/18 1443 01/13/18 1619       CXR 9/26: No acute findings  IMP: 1) altered mental status 2) severe sepsis 3) Pseudomonas urinary tract infection 4) bradycardia, improved on dopamine 5) hypotension, improved 6) AKI, now nonoliguric 6) elevated LFTs, improving  PLAN/REC: Complete 7 days of antibiotics Minimize medications that might affect cognition Wean dopamine off for MAP > 65 mmHg and HR > 60/min Nephrology following.  Poor candidate for long-term dialysis Monitor LFTs intermittently anticipating resolution  Once off of dopamine, may be transferred to [place] floor with telemetry  Merton Border, MD PCCM service Mobile [phone] Pager [phone] 01/17/2018 2:35 PM

## 2018-01-17 NOTE — Progress Notes (Addendum)
ANTICOAGULATION CONSULT NOTE   Pharmacy Consult for Heparin Drip Management  Indication: atrial fibrillation  Allergies  Allergen Reactions  . Aspirin     GI Bleeding  . Codeine Other (See Comments)    BLISTERS Other reaction(s): Other (See Comments) BLISTERS   . Codeine Sulfate Other (See Comments)    Other reaction(s): Other (See Comments) BLISTERS BLISTERS    Patient Measurements: Height: 5\' 8"  (172.7 cm) Weight: 148 lb 13 oz (67.5 kg) IBW/kg (Calculated) : 68.4  Vital Signs: Temp: 98.4 F (36.9 C) (09/27 1200) Temp Source: Oral (09/27 1200) BP: 131/68 (09/27 1700) Pulse Rate: 86 (09/27 1700)  Labs: Recent Labs    01/15/18 0520 01/16/18 0513 01/17/18 0558  HGB 9.5* 9.0*  --   HCT 27.4* 25.7*  --   PLT 294 241  --   CREATININE 4.23* 4.78* 4.77*  CKTOTAL 54  --   --     Estimated Creatinine Clearance: 12.2 mL/min (A) (by C-G formula based on SCr of 4.77 mg/dL (H)).   Medical History: Past Medical History:  Diagnosis Date  . Anemia   . Anxiety   . Arthritis   . Cardiomyopathy ([place])    a. 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  Marland Kitchen Chronic combined systolic (congestive) and diastolic (congestive) heart failure ([place])    a. 05/2015 TEE: EF 35-40% (in setting of Afib).  . Chronic Pelvic pain   . Coronary artery disease    a. 05/2015 CTA Chest: 3 vessel coronary Ca2+.  . Depression   . Diabetes mellitus without complication ([place])   . Emphysema lung ([place])   . Gastric ulcer   . Gout   . Hyperlipidemia   . Hypertension   . Nephrolithiasis   . PAF (paroxysmal atrial fibrillation) ([place])    a. 05/2015 s/p DCCV; b. 03/2016 s/p DCCV; c. CHA2DS2VASc = 6 (coumadin).  . Prostate cancer [facility])    Prostate  . Stage III chronic kidney disease ([place])   . Weight loss    50 lb since Apr 24, 2015    Medications:  Scheduled:  . allopurinol  100 mg Oral Daily  . insulin aspart  0-9 Units Subcutaneous TID WC  . levothyroxine  25 mcg Intravenous  Daily  . midodrine  10 mg Oral TID WC  . OLANZapine  2.5 mg Oral QHS  . senna-docusate  2 tablet Oral BID   Infusions:  . ceFEPime (MAXIPIME) IV Stopped (01/16/18 2222)  . DOPamine Stopped (01/17/18 1100)  . lactated ringers 50 mL/hr at 01/17/18 1700    Assessment: Pharmacy consulted for heparin drip management for 77 yo male previously ordered home dose of apixaban 2.5mg  PO BID. Patient with acute renal failure. Last dose of apixaban at 1200 on 9/27.   Home dose of apixaban 2.5mg  BID due to bleeding issues as an outpatient per son.   Goal of Therapy:  Heparin level 0.3-0.7 units/ml  APTT 66-102s  Monitor platelets by anticoagulation protocol: Yes   Plan:  Will initiate heparin 950 units/hr starting at 0000. Will obtain initial aPTT at 0800. Will obtain first anti-Xa level on 9/29. Will obtain anti-Xa levels daily until aPTT and anti-Xa levels correlate.   Pharmacy will continue to monitor and adjust per consult.   Simpson,Michael L 01/17/2018,5:15 PM

## 2018-01-17 NOTE — Progress Notes (Signed)
[place] at [facility]                                                                                                                                                                                  Patient Demographics   Gregory Tyler, is a 77 y.o. male, DOB - 03-17-1941, QVZ:[id number]  Admit date - 01/13/2018   Admitting Physician Dustin Flock, MD  Outpatient Primary MD for the patient is Sparks, Leonie Douglas, MD   LOS - 4  Subjective:  Patient continues to require pressor,     Review of Systems:   CONSTITUTIONAL: No documented fever. No fatigue, weakness. No weight gain, no weight loss.  EYES: No blurry or double vision.  ENT: No tinnitus. No postnasal drip. No redness of the oropharynx.  RESPIRATORY: No cough, no wheeze, no hemoptysis. No dyspnea.  CARDIOVASCULAR: No chest pain. No orthopnea. No palpitations. No syncope.  GASTROINTESTINAL: No nausea, no vomiting or diarrhea. No abdominal pain. No melena or hematochezia.  GENITOURINARY: No dysuria or hematuria.  ENDOCRINE: No polyuria or nocturia. No heat or cold intolerance.  HEMATOLOGY: No anemia. No bruising. No bleeding.  INTEGUMENTARY: No rashes. No lesions.  MUSCULOSKELETAL: No arthritis. No swelling. No gout.  Complains of foot pain NEUROLOGIC: No numbness, tingling, or ataxia. No seizure-type activity.  PSYCHIATRIC: No anxiety. No insomnia. No ADD.    Vitals:   Vitals:   01/17/18 1400 01/17/18 1500 01/17/18 1600 01/17/18 1700  BP: (!) 101/49 (!) 103/48 91/65 131/68  Pulse: 64 (!) 59 61 86  Resp: (!) 6 (!) 8 17 16   Temp:      TempSrc:      SpO2: 100% 100% (!) 87% (!) 82%  Weight:      Height:        Wt Readings from Last 3 Encounters:  01/13/18 67.5 kg  01/08/18 67.6 kg  12/19/17 68.8 kg     Intake/Output Summary (Last 24 hours) at 01/17/2018 1753 Last data filed at 01/17/2018 1700 Gross per 24 hour  Intake 1642.99 ml  Output 850 ml  Net 792.99 ml    Physical Exam:    GENERAL: Pleasant-appearing in no apparent distress.  HEAD, EYES, EARS, NOSE AND THROAT: Atraumatic, normocephalic. Extraocular muscles are intact. Pupils equal and reactive to light. Sclerae anicteric. No conjunctival injection. No oro-pharyngeal erythema.  NECK: Supple. There is no jugular venous distention. No bruits, no lymphadenopathy, no thyromegaly.  HEART: Regular rate and rhythm,. No murmurs, no rubs, no clicks.  LUNGS: Clear to auscultation bilaterally. No rales or rhonchi. No wheezes.  ABDOMEN: Soft, flat, nontender, nondistended. Has good bowel sounds. No hepatosplenomegaly appreciated.  EXTREMITIES: No evidence of  any cyanosis, clubbing, or peripheral edema.  +2 pedal and radial pulses bilaterally.  NEUROLOGIC: The patient is alert, awake, and oriented x3 with no focal motor or sensory deficits appreciated bilaterally.  SKIN: Moist and warm with no rashes appreciated.  Psych: Not anxious, depressed LN: No inguinal LN enlargement    Antibiotics   Anti-infectives (From admission, onward)   Start     Dose/Rate Route Frequency Ordered Stop   01/16/18 2200  ceFEPIme (MAXIPIME) 2 g in sodium chloride 0.9 % 100 mL IVPB     2 g 200 mL/hr over 30 Minutes Intravenous Every 24 hours 01/16/18 1251 01/22/18 2359   01/15/18 1200  vancomycin (VANCOCIN) IVPB 1000 mg/200 mL premix  Status:  Discontinued     1,000 mg 200 mL/hr over 60 Minutes Intravenous Every 48 hours 01/14/18 1135 01/15/18 1022   01/15/18 1000  vancomycin (VANCOCIN) IVPB 750 mg/150 ml premix  Status:  Discontinued     750 mg 150 mL/hr over 60 Minutes Intravenous Every 48 hours 01/14/18 0839 01/14/18 1135   01/14/18 1000  vancomycin (VANCOCIN) IVPB 750 mg/150 ml premix  Status:  Discontinued     750 mg 150 mL/hr over 60 Minutes Intravenous Every 48 hours 01/13/18 1652 01/14/18 0839   01/14/18 0600  vancomycin (VANCOCIN) IVPB 750 mg/150 ml premix  Status:  Discontinued     750 mg 150 mL/hr over 60 Minutes Intravenous  Every 48 hours 01/13/18 1548 01/13/18 1652   01/13/18 2200  piperacillin-tazobactam (ZOSYN) IVPB 3.375 g  Status:  Discontinued     3.375 g 12.5 mL/hr over 240 Minutes Intravenous Every 12 hours 01/13/18 1548 01/16/18 1238   01/13/18 1445  vancomycin (VANCOCIN) IVPB 1000 mg/200 mL premix     1,000 mg 200 mL/hr over 60 Minutes Intravenous  Once 01/13/18 1443 01/13/18 1624   01/13/18 1445  piperacillin-tazobactam (ZOSYN) IVPB 3.375 g     3.375 g 100 mL/hr over 30 Minutes Intravenous  Once 01/13/18 1443 01/13/18 1619      Medications   Scheduled Meds: . allopurinol  100 mg Oral Daily  . insulin aspart  0-9 Units Subcutaneous TID WC  . levothyroxine  25 mcg Intravenous Daily  . midodrine  10 mg Oral TID WC  . OLANZapine  2.5 mg Oral QHS  . senna-docusate  2 tablet Oral BID   Continuous Infusions: . ceFEPime (MAXIPIME) IV Stopped (01/16/18 2222)  . DOPamine Stopped (01/17/18 1100)  . [START ON 01/18/2018] heparin    . lactated ringers 50 mL/hr at 01/17/18 1700   PRN Meds:.[DISCONTINUED] ondansetron **OR** ondansetron (ZOFRAN) IV, polyethylene glycol   Data Review:   Micro Results Recent Results (from the past 240 hour(s))  Blood culture (routine x 2)     Status: None (Preliminary result)   Collection Time: 01/13/18  1:39 PM  Result Value Ref Range Status   Specimen Description RIGHT ANTECUBITAL  Final   Special Requests   Final    BOTTLES DRAWN AEROBIC AND ANAEROBIC Blood Culture adequate volume   Culture   Final    NO GROWTH 4 DAYS Performed at [facility], [address] [place], [place] [address]    Report Status PENDING  Incomplete  Blood culture (routine x 2)     Status: None (Preliminary result)   Collection Time: 01/13/18  1:39 PM  Result Value Ref Range Status   Specimen Description LEFT ANTECUBITAL  Final   Special Requests   Final    BOTTLES DRAWN AEROBIC AND ANAEROBIC  Blood Culture results may not be optimal due to an excessive volume of blood  received in culture bottles   Culture   Final    NO GROWTH 4 DAYS Performed at [facility], [address] [place], [place] [address]    Report Status PENDING  Incomplete  Urine Culture     Status: Abnormal   Collection Time: 01/13/18  1:39 PM  Result Value Ref Range Status   Specimen Description   Final    URINE, RANDOM Performed at [facility], [address] [place], [place] [address]    Special Requests   Final    NONE Performed at [facility], [address] [place], [place] [address]    Culture >=100,000 COLONIES/mL PSEUDOMONAS AERUGINOSA (A)  Final   Report Status 01/16/2018 FINAL  Final   Organism ID, Bacteria PSEUDOMONAS AERUGINOSA (A)  Final      Susceptibility   Pseudomonas aeruginosa - MIC*    CEFTAZIDIME 4 SENSITIVE Sensitive     CIPROFLOXACIN <=0.25 SENSITIVE Sensitive     GENTAMICIN <=1 SENSITIVE Sensitive     IMIPENEM 1 SENSITIVE Sensitive     PIP/TAZO <=4 SENSITIVE Sensitive     CEFEPIME 2 SENSITIVE Sensitive     * >=100,000 COLONIES/mL PSEUDOMONAS AERUGINOSA  MRSA PCR Screening     Status: None   Collection Time: 01/13/18  5:13 PM  Result Value Ref Range Status   MRSA by PCR NEGATIVE NEGATIVE Final    Comment:        The GeneXpert MRSA Assay (FDA approved for NASAL specimens only), is one component of a comprehensive MRSA colonization surveillance program. It is not intended to diagnose MRSA infection nor to guide or monitor treatment for MRSA infections. Performed at [facility], [address] [place], [place] [address]     Radiology Reports Ct Head Wo Contrast  Result Date: 01/13/2018 CLINICAL DATA:  Altered level consciousness beginning this morning. History of hypertension, hyperlipidemia, recurrent prostate cancer. EXAM: CT HEAD WITHOUT CONTRAST TECHNIQUE: Contiguous axial images were obtained from the base of the skull through the vertex without intravenous contrast. COMPARISON:  None. FINDINGS:  BRAIN: No intraparenchymal hemorrhage, mass effect nor midline shift. The ventricles and sulci are normal for age. Patchy to confluent supratentorial white matter hypodensities. Small area of focal blurring RIGHT posterior frontal gray-white matter junction (series 5, image 14). No abnormal extra-axial fluid collections. Basal cisterns are patent. VASCULAR: Moderate calcific atherosclerosis of the carotid siphons. SKULL: No skull fracture. No significant scalp soft tissue swelling. SINUSES/ORBITS: Paranasal sinuses are well aerated. Mastoid air cells are well aerated.The included ocular globes and orbital contents are non-suspicious. OTHER: None. IMPRESSION: 1. 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 2. Moderate to severe chronic small vessel ischemic changes. Electronically Signed   By: Elon Alas M.D.   On: 01/13/2018 14:36   US Renal  Result Date: 01/14/2018 CLINICAL DATA:  77 year old male. Renal failure. Initial encounter. EXAM: RENAL / URINARY TRACT ULTRASOUND COMPLETE COMPARISON:  11/15/2017 CT. FINDINGS: Right Kidney: Length: 10 cm. Echogenicity within normal limits. No mass or hydronephrosis visualized. Left Kidney: Length: 9.1 cm. Echogenicity within normal limits. No hydronephrosis. Nonobstructing renal calculi measure up to 5 mm. Evaluation limited although no mass identified. Bladder: Decompressed by Foley catheter IMPRESSION: 1. No hydronephrosis. 2. Nonobstructing left renal calculi measuring up to 5 mm. Electronically Signed   By: Genia Del M.D.   On: 01/14/2018 10:27   Dg Chest [facility] 1 [address]  Result Date: 01/16/2018 CLINICAL DATA:  Shortness of Breath EXAM: PORTABLE CHEST 1 VIEW COMPARISON:  01/15/2018 FINDINGS: Cardiac shadow is stable. Left subclavian central venous line is again seen and stable. The lungs are well aerated bilaterally without focal infiltrate or sizable effusion. No acute bony abnormality is noted. Postoperative changes in the upper abdomen  are seen. IMPRESSION: No acute abnormality noted. Electronically Signed   By: Inez Catalina M.D.   On: 01/16/2018 09:21   Dg Chest Port 1 View  Result Date: 01/15/2018 CLINICAL DATA:  Shortness of breath EXAM: PORTABLE CHEST 1 VIEW COMPARISON:  January 13, 2018 FINDINGS: The heart size and mediastinal contours are stable. Left central venous line is unchanged. Both lungs are clear. The visualized skeletal structures are stable. IMPRESSION: No active cardiopulmonary disease. Electronically Signed   By: Abelardo Diesel M.D.   On: 01/15/2018 10:45   Dg Chest Port 1 View  Result Date: 01/13/2018 CLINICAL DATA:  Central line placement EXAM: PORTABLE CHEST 1 VIEW COMPARISON:  Chest x-ray from earlier same day. FINDINGS: LEFT subclavian central line in place with tip positioned at the level of the mid/upper SVC. Heart size and mediastinal contours are within normal limits. Lungs are clear. No pleural effusion or pneumothorax seen. IMPRESSION: LEFT subclavian central line placement with tip positioned at the level of the mid/upper SVC. No pneumothorax seen. Electronically Signed   By: Franki Cabot M.D.   On: 01/13/2018 18:42   Dg Abdomen Acute W/chest  Result Date: 01/13/2018 CLINICAL DATA:  Abdominal pain with altered mental status EXAM: DG ABDOMEN ACUTE W/ 1V CHEST COMPARISON:  Chest radiograph Sep 11, 2017; chest CT November 15, 2017 FINDINGS: PA chest: Lungs are clear. Heart size and pulmonary vascularity are normal. No adenopathy. There is aortic atherosclerosis. There are surgical clips in the upper abdomen. Supine and upright abdomen: There is fairly diffuse stool throughout the colon. There is no bowel dilatation or air-fluid level to suggest bowel obstruction. No free air. There is postoperative change in the lower lumbar region. There are multiple surgical clips in the upper abdomen. There are also surgical clips in the pelvis. IMPRESSION: Multiple areas of postoperative change in the abdomen pelvis.  Diffuse stool throughout much of the colon. No evident bowel obstruction or free air. No lung edema or consolidation. There is aortic atherosclerosis. Aortic Atherosclerosis (ICD10-I70.0). Electronically Signed   By: Lowella Grip III M.D.   On: 01/13/2018 14:27   US Abdomen Limited Ruq  Result Date: 01/14/2018 CLINICAL DATA:  77 year old male with abnormal liver function studies. Initial encounter. EXAM: ULTRASOUND ABDOMEN LIMITED RIGHT UPPER QUADRANT COMPARISON:  11/15/2017 CT. FINDINGS: Gallbladder: Prior cholecystectomy. Common bile duct: Diameter: 4 mm Liver: Liver of slight increased echogenicity consistent with fatty infiltration and/or hepatocellular disease. No focal hepatic lesion noted. Portal vein is patent on color Doppler imaging with normal direction of blood flow towards the liver. IMPRESSION: 1. Post cholecystectomy. 2. Common bile duct of normal size. 3. Liver of slight increased echogenicity possibly related to result of fatty infiltration or hepatocellular disease. No focal hepatic lesion noted. Portal vein patent. Electronically Signed   By: Genia Del M.D.   On: 01/14/2018 10:30     CBC Recent Labs  Lab 01/13/18 1339 01/14/18 0349 01/15/18 0520 01/16/18 0513  WBC 15.6* 18.6* 12.9* 7.9  HGB 10.3* 10.3* 9.5* 9.0*  HCT 30.8* 30.4* 27.4* 25.7*  PLT 389 368 294 241  MCV 94.2 93.2 92.7 92.8  MCH 31.4 31.7 32.0 32.5  MCHC 33.4  34.0 34.5 35.0  RDW 16.0* 15.6* 15.7* 15.2*  LYMPHSABS 1.3 1.4 1.7 1.6  MONOABS 0.6 0.8 0.9 0.5  EOSABS 0.0 0.0 0.0 0.1  BASOSABS 0.0 0.0 0.0 0.0    Chemistries  Recent Labs  Lab 01/13/18 1951 01/14/18 0349 01/14/18 0917 01/15/18 0520 01/16/18 0513 01/17/18 0558  NA 135 136  --  137 140 141  K 5.5* 5.8* 5.3* 4.9 4.8 4.5  CL 108 105  --  107 108 113*  CO2 18* 19*  --  21* 21* 20*  GLUCOSE 91 165*  --  134* 150* 142*  BUN 84* 85*  --  87* 84* 80*  CREATININE 3.68* 3.75*  --  4.23* 4.78* 4.77*  CALCIUM 7.3* 7.5*  --  7.1* 7.0* 6.9*   MG  --  1.9  --  1.9 1.9  --   AST 275* 188*  --  94* 1,136* 439*  ALT 239* 225*  --  152* 685* 412*  ALKPHOS 287* 296*  --  234* 443* 303*  BILITOT 0.8 0.6  --  0.6 0.5 0.5   ------------------------------------------------------------------------------------------------------------------ estimated creatinine clearance is 12.2 mL/min (A) (by C-G formula based on SCr of 4.77 mg/dL (H)). ------------------------------------------------------------------------------------------------------------------ No results for input(s): HGBA1C in the last 72 hours. ------------------------------------------------------------------------------------------------------------------ No results for input(s): CHOL, HDL, LDLCALC, TRIG, CHOLHDL, LDLDIRECT in the last 72 hours. ------------------------------------------------------------------------------------------------------------------ No results for input(s): TSH, T4TOTAL, T3FREE, THYROIDAB in the last 72 hours.  Invalid input(s): FREET3 ------------------------------------------------------------------------------------------------------------------ No results for input(s): VITAMINB12, FOLATE, FERRITIN, TIBC, IRON, RETICCTPCT in the last 72 hours.  Coagulation profile No results for input(s): INR, PROTIME in the last 168 hours.  No results for input(s): DDIMER in the last 72 hours.  Cardiac Enzymes Recent Labs  Lab 01/13/18 1339 01/13/18 1951 01/14/18 0349  TROPONINI 0.04* 0.04* 0.05*   ------------------------------------------------------------------------------------------------------------------ Invalid input(s): POCBNP    Assessment & Plan   IMPRESSION AND PLAN: Patient 77 year old nursing home due to altered mental status  1.  Acute encephalopathy due to sepsis Mental status improved,   2.  Sepsis due to UTI with Pseudomonas continue broad-spectrum antibiotics as well as pressor  3.  Acute renal failure likely due to ATN  renal function continue to get worse and continue to monitor  4.  Elevated LFTs likely due to shock liver liver function worse today  5.  Chronic systolic CHF blood pressure low hold all medications that could lower blood pressure including diuretic repeat echo shows normal EF  6.  Diabetes type 2 continue sliding-scale insulin  7.  Hypertension currently hypotensive hold blood pressure meds  8.  Miscellaneous patient on Eliquis which we will continue      Code Status Orders  (From admission, onward)         Start     Ordered   01/13/18 1713  Do not attempt resuscitation (DNR)  Continuous    Question Answer Comment  In the event of cardiac or respiratory ARREST Do not call a "code blue"   In the event of cardiac or respiratory ARREST Do not perform Intubation, CPR, defibrillation or ACLS   In the event of cardiac or respiratory ARREST Use medication by any route, position, wound care, and other measures to relive pain and suffering. May use oxygen, suction and manual treatment of airway obstruction as needed for comfort.   Comments RN may pronounce      01/13/18 1712        Code Status History    Date Active Date  Inactive Code Status Order ID Comments User Context   12/19/2017 1932 12/20/2017 2151 DNR [id number]  [name], MD Inpatient   11/17/2017 1357 11/19/2017 1603 DNR [id number]  Hermelinda Dellen, DO Inpatient   11/15/2017 1550 11/17/2017 1357 Full Code [id number]  Loletha Grayer, MD ED   09/11/2017 1951 09/15/2017 1603 Full Code [id number]  Bettey Costa, MD ED   05/11/2015 1307 05/13/2015 1256 Full Code [id number]  Aldean Jewett, MD Inpatient   04/25/2015 0149 04/27/2015 1741 Full Code [id number]  Hillary Bow, MD ED           Consults pulmonary critical care, nephrology  DVT Prophylaxis Eliquis  Lab Results  Component Value Date   PLT 241 01/16/2018     Time Spent in minutes   57min  Greater than 50% of time spent in care coordination and counseling patient  regarding the condition and plan of care.   Dustin Flock M.D on 01/17/2018 at 5:53 PM  Between 7am to 6pm - Pager - [phone]  After 6pm go to [email or web address] - Proofreader  [employer]   Office  [phone]

## 2018-01-17 NOTE — Evaluation (Signed)
Physical Therapy Evaluation Patient Details Name: Gregory Tyler MRN: [id number] DOB: Feb 26, 1941 Today's Date: 01/17/2018   History of Present Illness  77 year old male admitted for AMS and encephalopathy due to UTI, acute renal failure and failure to thrive and was at [facility].  He was here ~1 month ago, for weakness and diarrhea and had pressure ulcers B LES feet as well as sacral pressure sores.  PMH includes stage III CKD, prostate CA, PAF, gout, cardiomyopathy and chronic pelvic pain. 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 Patient's brother present. Patient reports B foot/ankle pain with mobility due to wounds. Requires max assist for rolling, supine >< sit. Patient unable to sit at eob unsupported, requiring cues to lean trunk forward. Patient will benefit from skilled PT while here in hospital to improve functional mobility and return to [facility].       Follow Up Recommendations SNF    Equipment Recommendations  None recommended by PT    Recommendations for Other Services       Precautions / Restrictions Precautions Precautions: Fall Restrictions Weight Bearing Restrictions: Yes RLE Weight Bearing: Partial weight bearing LLE Weight Bearing: Partial weight bearing      Mobility  Bed Mobility Overal bed mobility: Needs Assistance Bed Mobility: Rolling;Supine to Sit;Sit to Supine Rolling: +2 for physical assistance   Supine to sit: +2 for physical assistance Sit to supine: +2 for physical assistance   General bed mobility comments: Patient leaning posteriorly when seated requiring cues to lean trunk forward. Requires support behind shoulders to maintain sitting.   Transfers                 General transfer comment: Patient unable to attempt standing this day  Ambulation/Gait             General Gait Details: unable to attempt walking this day  Stairs             Wheelchair Mobility    Modified Rankin (Stroke Patients Only)       Balance Overall balance assessment: Needs assistance Sitting-balance support: Bilateral upper extremity supported Sitting balance-Leahy Scale: Poor   Postural control: Posterior lean                                   Pertinent Vitals/Pain Pain Assessment: Faces Faces Pain Scale: Hurts little more Pain Location: B LEs, feet, heels due to wounds Pain Descriptors / Indicators: Discomfort;Constant;Throbbing;Tender Pain Intervention(s): Monitored during session;Limited activity within patient's tolerance;Repositioned    Home Living Family/patient expects to be discharged to:: Skilled nursing facility                 Additional Comments: [facility]-his wife is there and has end stage Parkinson's and recently fell and broke her hip    Prior Function Level of [place]: Needs assistance   Gait / Transfers Assistance Needed: Pt was using RW several months ago but is only standing and taking few steps with PT at [facility] per family report due to pain in BLEs.    ADL's / Homemaking Assistance Needed: Pt is reliant on children to assist with most needs        Hand Dominance        Extremity/Trunk Assessment   Upper Extremity Assessment Upper Extremity Assessment: Defer to OT evaluation  Lower Extremity Assessment Lower Extremity Assessment: Generalized weakness       Communication   Communication: HOH  Cognition Arousal/Alertness: Awake/alert Behavior During Therapy: Flat affect Overall Cognitive Status: History of cognitive impairments - at baseline                                 General Comments: Family states patient has not walked in probably 3 months.       General Comments      Exercises Total Joint Exercises Ankle Circles/Pumps: AAROM;10 reps;Both Heel Slides: AAROM;10 reps;Both Straight Leg Raises: AAROM;5 reps;Both    Assessment/Plan    PT Assessment Patient needs continued PT services  PT Problem List Decreased strength;Decreased activity tolerance;Pain;Decreased cognition;Decreased balance       PT Treatment Interventions Balance training;Therapeutic activities;Patient/family education    PT Goals (Current goals can be found in the Care Plan section)  Acute Rehab PT Goals Patient Stated Goal: to go back to [facility] PT Goal Formulation: With patient Time For Goal Achievement: 01/31/18 Potential to Achieve Goals: Fair    Frequency Min 2X/week   Barriers to discharge        Co-evaluation               AM-PAC PT "6 Clicks" Daily Activity  Outcome Measure Difficulty turning over in bed (including adjusting bedclothes, sheets and blankets)?: Unable Difficulty moving from lying on back to sitting on the side of the bed? : Unable Difficulty sitting down on and standing up from a chair with arms (e.g., wheelchair, bedside commode, etc,.)?: Unable Help needed moving to and from a bed to chair (including a wheelchair)?: Total Help needed walking in hospital room?: Total Help needed climbing 3-5 steps with a railing? : Total 6 Click Score: 6    End of Session   Activity Tolerance: Patient limited by lethargy Patient left: in bed;with bed alarm set;with call bell/phone within reach;with family/visitor present   PT Visit Diagnosis: Muscle weakness (generalized) (M62.81);Difficulty in walking, not elsewhere classified (R26.2);Other abnormalities of gait and mobility (R26.89);Pain Pain - Right/Left: Left Pain - part of body: Ankle and joints of foot    Time: 0945-1010 PT Time Calculation (min) (ACUTE ONLY): 25 min   Charges:   PT Evaluation $PT Eval Moderate Complexity: 1 Mod          Angel Hobdy, PT, GCS 01/17/18,11:39 AM

## 2018-01-18 LAB — COMPREHENSIVE METABOLIC PANEL
ALBUMIN: 1.9 g/dL — AB (ref 3.5–5.0)
ALK PHOS: 359 U/L — AB (ref 38–126)
ALT: 553 U/L — AB (ref 0–44)
ANION GAP: 10 (ref 5–15)
AST: 590 U/L — ABNORMAL HIGH (ref 15–41)
BUN: 79 mg/dL — ABNORMAL HIGH (ref 8–23)
CALCIUM: 7.9 mg/dL — AB (ref 8.9–10.3)
CO2: 21 mmol/L — ABNORMAL LOW (ref 22–32)
Chloride: 113 mmol/L — ABNORMAL HIGH (ref 98–111)
Creatinine, Ser: 5.43 mg/dL — ABNORMAL HIGH (ref 0.61–1.24)
GFR calc Af Amer: 10 mL/min — ABNORMAL LOW (ref >60)
GFR calc non Af Amer: 9 mL/min — ABNORMAL LOW (ref >60)
Glucose, Bld: 107 mg/dL — ABNORMAL HIGH (ref 70–99)
Potassium: 4.6 mmol/L (ref 3.5–5.1)
SODIUM: 144 mmol/L (ref 135–145)
Total Bilirubin: 0.7 mg/dL (ref 0.3–1.2)
Total Protein: 5.4 g/dL — ABNORMAL LOW (ref 6.5–8.1)

## 2018-01-18 LAB — CBC
HCT: 27.2 % — ABNORMAL LOW (ref 40.0–52.0)
Hemoglobin: 9.4 g/dL — ABNORMAL LOW (ref 13.0–18.0)
MCH: 32.5 pg (ref 26.0–34.0)
MCHC: 34.6 g/dL (ref 32.0–36.0)
MCV: 93.7 fL (ref 80.0–100.0)
PLATELETS: 205 10*3/uL (ref 150–440)
RBC: 2.9 MIL/uL — ABNORMAL LOW (ref 4.40–5.90)
RDW: 16 % — AB (ref 11.5–14.5)
WBC: 7.6 10*3/uL (ref 3.8–10.6)

## 2018-01-18 LAB — GLUCOSE, CAPILLARY
GLUCOSE-CAPILLARY: 158 mg/dL — AB (ref 70–99)
Glucose-Capillary: 110 mg/dL — ABNORMAL HIGH (ref 70–99)
Glucose-Capillary: 205 mg/dL — ABNORMAL HIGH (ref 70–99)
Glucose-Capillary: 168 mg/dL — ABNORMAL HIGH (ref 70–99)

## 2018-01-18 LAB — CULTURE, BLOOD (ROUTINE X 2)
CULTURE: NO GROWTH
Culture: NO GROWTH
SPECIAL REQUESTS: ADEQUATE

## 2018-01-18 LAB — APTT
aPTT: 102 seconds — ABNORMAL HIGH (ref 24–36)
aPTT: 111 seconds — ABNORMAL HIGH (ref 24–36)

## 2018-01-18 LAB — PROTIME-INR
INR: 1.46
Prothrombin Time: 17.6 seconds — ABNORMAL HIGH (ref 11.4–15.2)

## 2018-01-18 LAB — PHOSPHORUS: PHOSPHORUS: 5.9 mg/dL — AB (ref 2.5–4.6)

## 2018-01-18 LAB — MAGNESIUM: Magnesium: 2 mg/dL (ref 1.7–2.4)

## 2018-01-18 MED ORDER — SODIUM CHLORIDE 0.9 % IV SOLN
1.0000 g | INTRAVENOUS | Status: DC
  Administered 2018-01-18 – 2018-01-20 (×3): 1 g via INTRAVENOUS
  Filled 2018-01-18 (×4): qty 1

## 2018-01-18 NOTE — Progress Notes (Signed)
ANTICOAGULATION CONSULT NOTE   Pharmacy Consult for Heparin Drip Management  Indication: atrial fibrillation  Allergies  Allergen Reactions  . Aspirin     GI Bleeding  . Codeine Other (See Comments)    BLISTERS Other reaction(s): Other (See Comments) BLISTERS   . Codeine Sulfate Other (See Comments)    Other reaction(s): Other (See Comments) BLISTERS BLISTERS    Patient Measurements: Height: 5\' 8"  (172.7 cm) Weight: 148 lb 13 oz (67.5 kg) IBW/kg (Calculated) : 68.4  Vital Signs: Temp: 97.6 F (36.4 C) (09/28 0427) Temp Source: Oral (09/28 0427) BP: 94/51 (09/28 0427) Pulse Rate: 68 (09/28 0427)  Labs: Recent Labs    01/16/18 0513 01/17/18 0558 01/18/18 0818 01/18/18 0820  HGB 9.0*  --  9.4*  --   HCT 25.7*  --  27.2*  --   PLT 241  --  205  --   APTT  --   --   --  102*  CREATININE 4.78* 4.77*  --  5.43*    Estimated Creatinine Clearance: 10.7 mL/min (A) (by C-G formula based on SCr of 5.43 mg/dL (H)).   Medical History: Past Medical History:  Diagnosis Date  . Anemia   . Anxiety   . Arthritis   . Cardiomyopathy ([place])    a. 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  Marland Kitchen Chronic combined systolic (congestive) and diastolic (congestive) heart failure ([place])    a. 05/2015 TEE: EF 35-40% (in setting of Afib).  . Chronic Pelvic pain   . Coronary artery disease    a. 05/2015 CTA Chest: 3 vessel coronary Ca2+.  . Depression   . Diabetes mellitus without complication ([place])   . Emphysema lung ([place])   . Gastric ulcer   . Gout   . Hyperlipidemia   . Hypertension   . Nephrolithiasis   . PAF (paroxysmal atrial fibrillation) ([place])    a. 05/2015 s/p DCCV; b. 03/2016 s/p DCCV; c. CHA2DS2VASc = 6 (coumadin).  . Prostate cancer [facility])    Prostate  . Stage III chronic kidney disease ([place])   . Weight loss    50 lb since Apr 24, 2015    Medications:  Scheduled:  . allopurinol  100 mg Oral Daily  . insulin aspart  0-9 Units Subcutaneous TID WC  .  levothyroxine  25 mcg Intravenous Daily  . midodrine  10 mg Oral TID WC  . OLANZapine  2.5 mg Oral QHS  . senna-docusate  2 tablet Oral BID   Infusions:  . ceFEPime (MAXIPIME) IV    . heparin 950 Units/hr (01/18/18 0844)  . lactated ringers 50 mL/hr at 01/18/18 0844    Assessment: Pharmacy consulted for heparin drip management for 77 yo male previously ordered home dose of apixaban 2.5mg  PO BID. Patient with acute renal failure. Last dose of apixaban at 1200 on 9/27.   Home dose of apixaban 2.5mg  BID due to bleeding issues as an outpatient per son.   Goal of Therapy:  Heparin level 0.3-0.7 units/ml  APTT 66-102s  Monitor platelets by anticoagulation protocol: Yes   Plan:  9/28 aPTT therapeutic x 1. Continue current rate. Will recheck aPTT in 6 hours per protocol.   Pharmacy will continue to monitor and adjust per consult.   Laural Benes, Pharm.D., BCPS Clinical Pharmacist 01/18/2018,9:53 AM

## 2018-01-18 NOTE — Progress Notes (Addendum)
[facility] [place] NAME: Gregory Tyler    MR#:  [id number]  DATE OF BIRTH:  [birth year]  SUBJECTIVE:  CHIEF COMPLAINT:   Chief Complaint  Patient presents with  . Altered Mental Status  Patient without complaint, noted continued worsening of liver and kidney functioning, await further nephrology recommendations  REVIEW OF SYSTEMS:  CONSTITUTIONAL: No fever, fatigue or weakness.  EYES: No blurred or double vision.  EARS, NOSE, AND THROAT: No tinnitus or ear pain.  RESPIRATORY: No cough, shortness of breath, wheezing or hemoptysis.  CARDIOVASCULAR: No chest pain, orthopnea, edema.  GASTROINTESTINAL: No nausea, vomiting, diarrhea or abdominal pain.  GENITOURINARY: No dysuria, hematuria.  ENDOCRINE: No polyuria, nocturia,  HEMATOLOGY: No anemia, easy bruising or bleeding SKIN: No rash or lesion. MUSCULOSKELETAL: No joint pain or arthritis.   NEUROLOGIC: No tingling, numbness, weakness.  PSYCHIATRY: No anxiety or depression.   ROS  DRUG ALLERGIES:   Allergies  Allergen Reactions  . Aspirin     GI Bleeding  . Codeine Other (See Comments)    BLISTERS Other reaction(s): Other (See Comments) BLISTERS   . Codeine Sulfate Other (See Comments)    Other reaction(s): Other (See Comments) BLISTERS BLISTERS    VITALS:  Blood pressure 107/70, pulse 83, temperature 97.6 F (36.4 C), temperature source Oral, resp. rate 14, height 5\' 8"  (1.727 m), weight 67.5 kg, SpO2 100 %.  PHYSICAL EXAMINATION:  GENERAL:  77 y.o.-year-old patient lying in the bed with no acute distress.  EYES: Pupils equal, round, reactive to light and accommodation. No scleral icterus. Extraocular muscles intact.  HEENT: Head atraumatic, normocephalic. Oropharynx and nasopharynx clear.  NECK:  Supple, no jugular venous distention. No thyroid enlargement, no tenderness.  LUNGS: Normal breath sounds bilaterally, no wheezing, rales,rhonchi or crepitation. No use of  accessory muscles of respiration.  CARDIOVASCULAR: S1, S2 normal. No murmurs, rubs, or gallops.  ABDOMEN: Soft, nontender, nondistended. Bowel sounds present. No organomegaly or mass.  EXTREMITIES: No pedal edema, cyanosis, or clubbing.  NEUROLOGIC: Cranial nerves II through XII are intact. Muscle strength 5/5 in all extremities. Sensation intact. Gait not checked.  PSYCHIATRIC: The patient is alert and oriented x 3.  SKIN: No obvious rash, lesion, or ulcer.   Physical Exam LABORATORY PANEL:   CBC Recent Labs  Lab 01/18/18 0818  WBC 7.6  HGB 9.4*  HCT 27.2*  PLT 205   ------------------------------------------------------------------------------------------------------------------  Chemistries  Recent Labs  Lab 01/18/18 0818 01/18/18 0820  NA  --  144  K  --  4.6  CL  --  113*  CO2  --  21*  GLUCOSE  --  107*  BUN  --  79*  CREATININE  --  5.43*  CALCIUM  --  7.9*  MG 2.0  --   AST  --  590*  ALT  --  553*  ALKPHOS  --  359*  BILITOT  --  0.7   ------------------------------------------------------------------------------------------------------------------  Cardiac Enzymes Recent Labs  Lab 01/13/18 1951 01/14/18 0349  TROPONINI 0.04* 0.05*   ------------------------------------------------------------------------------------------------------------------  RADIOLOGY:  No results found.  ASSESSMENT AND PLAN:  IMPRESSION AND PLAN: Patient 77 year old nursing home due to altered mental status  *Acute encephalopathy due to sepsis Resolved  *Acute septic shock due to Pseudomonas UTI  Resolved Successfully weaned off pressors Continue cefepime for 5-7-day course  *Acute renal failure  Continue worsening noted  Await further nephrology recommendations-may require hemodialysis   *Acute worsening transaminitis  Suspected due to sepsis/acute shock  liver CMP daily, check INR level, avoid hepatotoxic agents, check hepatic panel, abdominal ultrasound per  radiology noted for fatty liver/hepatocellular disease    *Chronic systolic CHF without exacerbation repeat echo shows normal EF Strict I&O monitoring, daily weights  *Chronic diabetes mellitus type 2 Stable on current regiment  *Hypertension  Normotensive without antihypertensive medications   *Paroxysmal atrial fibrillation  Continue Eliquis      All the records are reviewed and case discussed with Care Management/Social Workerr. Management plans discussed with the patient, family and they are in agreement.  CODE STATUS: dnr  TOTAL TIME TAKING CARE OF THIS PATIENT: 35 minutes.     POSSIBLE D/C IN 2-3 DAYS, DEPENDING ON CLINICAL CONDITION.   Avel Peace Salary M.D on 01/18/2018   Between 7am to 6pm - Pager - [phone]  After 6pm go to [email or web address] - password [email or web address] [facility]  Office  [phone]  CC: Primary care physician; [name], MD  Note: This dictation was prepared with Dragon dictation along with smaller phrase technology. Any transcriptional errors that result from this process are unintentional.

## 2018-01-18 NOTE — Progress Notes (Signed)
Occupational Therapy Treatment Patient Details Name: Gregory Tyler MRN: [id number] DOB: 1940-11-16 Today's Date: 01/18/2018    History of present illness 77 year old male admitted for AMS and encephalopathy due to UTI, acute renal failure and failure to thrive and was at [facility].  He was here ~1 month ago, for weakness and diarrhea and had pressure ulcers B LES feet as well as sacral pressure sores.  PMH includes stage III CKD, prostate CA, PAF, gout, cardiomyopathy and chronic pelvic pain. 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 Assisted with bed mobility, max assist, repositioning to elevate heels to decrease pressure. Patient demonstrates decreased strength, ROM, mobility and decreased ability to perform self care tasks.  Recommend SNF to further work towards goals.    Follow Up Recommendations  SNF    Equipment Recommendations       Recommendations for Other Services      Precautions / Restrictions         Mobility Bed Mobility Overal bed mobility: Needs Assistance Bed Mobility: Rolling;Supine to Sit;Sit to Supine Rolling: Max assist            Transfers                      Balance                                           ADL either performed or assessed with clinical judgement   ADL   Eating/Feeding: Set up   Grooming: Minimal assistance                                       Vision Baseline Vision/History: Legally blind Patient Visual Report: No change from baseline     Perception     Praxis      Cognition Arousal/Alertness: Awake/alert Behavior During Therapy: Flat affect Overall Cognitive Status: History  of cognitive impairments - at baseline                                 General Comments: labile during session talking about his wife and being in a facility.         Exercises Other Exercises Other Exercises: Patient seen for BUE ROM exercises, prior rotator cuff issues and requires AAROM for shoulder, able to perform elbow, wrist and hand.  10 reps each for 2 sets.   Shoulder Instructions       General Comments      Pertinent Vitals/ Pain       Pain Assessment: Faces Faces Pain Scale: Hurts little more Pain Location: B LEs, feet, heels due to wounds Pain Descriptors / Indicators: Discomfort;Constant;Throbbing;Tender Pain Intervention(s): Repositioned  Home Living  Prior Functioning/Environment              Frequency  Min 1X/week        Progress Toward Goals  OT Goals(current goals can now be found in the care plan section)  Progress towards OT goals: Progressing toward goals  Acute Rehab OT Goals Patient Stated Goal: to go back to [facility] OT Goal Formulation: With patient/family Time For Goal Achievement: 01/29/18 Potential to Achieve Goals: [place] Discharge plan remains appropriate    Co-evaluation                 AM-PAC PT "6 Clicks" Daily Activity     Outcome Measure   Help from another person eating meals?: A Little Help from another person taking care of personal grooming?: A Little Help from another person toileting, which includes using toliet, bedpan, or urinal?: A Lot Help from another person bathing (including washing, rinsing, drying)?: A Lot Help from another person to put on and taking off regular upper body clothing?: A Little Help from another person to put on and taking off regular lower body clothing?: A Lot 6 Click Score: 15    End of Session    OT Visit Diagnosis: Muscle weakness (generalized) (M62.81);Pain;Low vision, both eyes  (H54.2);Unsteadiness on feet (R26.81) Pain - Right/Left: Right Pain - part of body: Ankle and joints of foot   Activity Tolerance Patient tolerated treatment well   Patient Left in bed;with call bell/phone within reach;with bed alarm set;with family/visitor present   Nurse Communication          Time: 3818-2993 OT Time Calculation (min): 16 min  Charges: OT General Charges $OT Visit: 1 Visit OT Treatments $Therapeutic Exercise: 8-22 mins  Amy T Lovett, OTR/L, CLT    Lovett,Amy 01/18/2018, 2:39 PM

## 2018-01-18 NOTE — Progress Notes (Signed)
[facility], [place] 01/18/18  Subjective:   Patient continues to be critically ill S Creatinine still critically high at 5.43.  Urine output 850 cc, but Foley bag is full of urine Patient states he has been able to eat some but appetite overall is low Patient's daughter and granddaughter are in the room with him    Objective:  Vital signs in last 24 hours:  Temp:  [97.6 F (36.4 C)-98.4 F (36.9 C)] 97.6 F (36.4 C) (09/28 0427) Pulse Rate:  [56-122] 68 (09/28 0427) Resp:  [6-18] 16 (09/28 0427) BP: (87-131)/(48-86) 94/51 (09/28 0427) SpO2:  [79 %-100 %] 98 % (09/28 0427)  Weight change:  Filed Weights   01/13/18 1327  Weight: 67.5 kg    Intake/Output:    Intake/Output Summary (Last 24 hours) at 01/18/2018 1111 Last data filed at 01/18/2018 1032 Gross per 24 hour  Intake 1538.68 ml  Output 850 ml  Net 688.68 ml     Physical Exam: General:  Chronically ill-appearing, laying in the bed, thin cachectic  HEENT  dry oral mucous membranes  Neck  supple  Pulm/lungs  decreased breath sounds at bases, room air   CVS/Heart  irregular, tachycardic  Abdomen:   Soft, nondistended,   Extremities:  + dependent peripheral edema, bilateral feet in bandages  Neurologic:  Alert, able to answer few questions,    Basic Metabolic Panel:  Recent Labs  Lab 01/14/18 0349 01/14/18 0917 01/15/18 0520 01/16/18 0513 01/17/18 0558 01/18/18 0818 01/18/18 0820  NA 136  --  137 140 141  --  144  K 5.8* 5.3* 4.9 4.8 4.5  --  4.6  CL 105  --  107 108 113*  --  113*  CO2 19*  --  21* 21* 20*  --  21*  GLUCOSE 165*  --  134* 150* 142*  --  107*  BUN 85*  --  87* 84* 80*  --  79*  CREATININE 3.75*  --  4.23* 4.78* 4.77*  --  5.43*  CALCIUM 7.5*  --  7.1* 7.0* 6.9*  --  7.9*  MG 1.9  --  1.9 1.9  --  2.0  --   PHOS 6.5*  --  6.2*  --   --  5.9*  --      CBC: Recent Labs  Lab 01/13/18 1339 01/14/18 0349 01/15/18 0520 01/16/18 0513 01/18/18 0818   WBC 15.6* 18.6* 12.9* 7.9 7.6  NEUTROABS 13.7* 16.5* 10.3* 5.7  --   HGB 10.3* 10.3* 9.5* 9.0* 9.4*  HCT 30.8* 30.4* 27.4* 25.7* 27.2*  MCV 94.2 93.2 92.7 92.8 93.7  PLT 389 368 294 241 205     No results found for: HEPBSAG, HEPBSAB, HEPBIGM    Microbiology:  Recent Results (from the past 240 hour(s))  Blood culture (routine x 2)     Status: None   Collection Time: 01/13/18  1:39 PM  Result Value Ref Range Status   Specimen Description RIGHT ANTECUBITAL  Final   Special Requests   Final    BOTTLES DRAWN AEROBIC AND ANAEROBIC Blood Culture adequate volume   Culture   Final    NO GROWTH 5 DAYS Performed at [facility], [address], [place], [place] [address]    Report Status 01/18/2018 FINAL  Final  Blood culture (routine x 2)     Status: None   Collection Time: 01/13/18  1:39 PM  Result Value Ref Range Status   Specimen Description LEFT ANTECUBITAL  Final   Special Requests   Final    BOTTLES DRAWN AEROBIC AND ANAEROBIC Blood Culture results may not be optimal due to an excessive volume of blood received in culture bottles   Culture   Final    NO GROWTH 5 DAYS Performed at [facility], [address] [place], [place] [address]    Report Status 01/18/2018 FINAL  Final  Urine Culture     Status: Abnormal   Collection Time: 01/13/18  1:39 PM  Result Value Ref Range Status   Specimen Description   Final    URINE, RANDOM Performed at [facility], [address], [place], [place] [address]    Special Requests   Final    NONE Performed at [facility], [address] [place], [place] [address]    Culture >=100,000 COLONIES/mL PSEUDOMONAS AERUGINOSA (A)  Final   Report Status 01/16/2018 FINAL  Final   Organism ID, Bacteria PSEUDOMONAS AERUGINOSA (A)  Final      Susceptibility   Pseudomonas aeruginosa - MIC*    CEFTAZIDIME 4 SENSITIVE Sensitive     CIPROFLOXACIN <=0.25 SENSITIVE Sensitive     GENTAMICIN <=1 SENSITIVE  Sensitive     IMIPENEM 1 SENSITIVE Sensitive     PIP/TAZO <=4 SENSITIVE Sensitive     CEFEPIME 2 SENSITIVE Sensitive     * >=100,000 COLONIES/mL PSEUDOMONAS AERUGINOSA  MRSA PCR Screening     Status: None   Collection Time: 01/13/18  5:13 PM  Result Value Ref Range Status   MRSA by PCR NEGATIVE NEGATIVE Final    Comment:        The GeneXpert MRSA Assay (FDA approved for NASAL specimens only), is one component of a comprehensive MRSA colonization surveillance program. It is not intended to diagnose MRSA infection nor to guide or monitor treatment for MRSA infections. Performed at [facility], [address] [place], [place] [address]     Coagulation Studies: No results for input(s): LABPROT, INR in the last 72 hours.  Urinalysis: No results for input(s): COLORURINE, LABSPEC, PHURINE, GLUCOSEU, HGBUR, BILIRUBINUR, KETONESUR, PROTEINUR, UROBILINOGEN, NITRITE, LEUKOCYTESUR in the last 72 hours.  Invalid input(s): APPERANCEUR    Imaging: No results found.   Medications:   . ceFEPime (MAXIPIME) IV    . heparin 950 Units/hr (01/18/18 0844)  . lactated ringers 50 mL/hr at 01/18/18 0844   . allopurinol  100 mg Oral Daily  . insulin aspart  0-9 Units Subcutaneous TID WC  . levothyroxine  25 mcg Intravenous Daily  . midodrine  10 mg Oral TID WC  . OLANZapine  2.5 mg Oral QHS  . senna-docusate  2 tablet Oral BID   [DISCONTINUED] ondansetron **OR** ondansetron (ZOFRAN) IV, polyethylene glycol  Assessment/ Plan:  77 y.o. Caucasian male with medical problems of chronic systolic congestive heart failure, diabetes type 2, atrial fibrillation, hypertension, history of gastric ulcer, coronary disease, who was admitted to [facility] on 01/13/2018 for evaluation of altered mental status.   1.  Acute renal failure on chronic kidney disease stage III Baseline creatinine 1.30/GFR 51 from December 20, 2017 Admission creatinine 3.86, still critically high at 5.43 The cause of acute  renal failure appears to be multifactorial including hypotension, sepsis, likely leading to ATN Renal u/s shows left non obstructing stone 5 mm; no hydronephrosis No acute indication for dialysis at present. Discussed with patient's son previously and daughter today that he may need temporary HD if no improvement in renal function although with his fragile health,  he may not be able to tolerate it. Will re-evaluate daily. NPO on Monday for permcath unless S Creatinine starts to improve   2.  UTI and sepsis, Pseudomonas aeuroginosa Patient is currently getting broad-spectrum antibiotics.   3. Hyperkalemia - currently in normal range -Patient is currently getting lactated Ringer at a lower dose.  Monitor potassium closely     LOS: [place] 9/28/201911:11 AM  [place], [place]  Note: This note was prepared with Dragon dictation. Any transcription errors are unintentional

## 2018-01-19 DIAGNOSIS — K7200 Acute and subacute hepatic failure without coma: Secondary | ICD-10-CM

## 2018-01-19 DIAGNOSIS — R945 Abnormal results of liver function studies: Secondary | ICD-10-CM

## 2018-01-19 LAB — CBC
HEMATOCRIT: 22.4 % — AB (ref 40.0–52.0)
Hemoglobin: 7.8 g/dL — ABNORMAL LOW (ref 13.0–18.0)
MCH: 32.7 pg (ref 26.0–34.0)
MCHC: 34.8 g/dL (ref 32.0–36.0)
MCV: 94.1 fL (ref 80.0–100.0)
Platelets: 176 10*3/uL (ref 150–440)
RBC: 2.38 MIL/uL — ABNORMAL LOW (ref 4.40–5.90)
RDW: 15.4 % — AB (ref 11.5–14.5)
WBC: 7.5 10*3/uL (ref 3.8–10.6)

## 2018-01-19 LAB — HEPATITIS PANEL, ACUTE
HCV Ab: <0.1 s/co ratio (ref 0.0–0.9)
HEP A IGM: NEGATIVE
HEP B C IGM: NEGATIVE
Hepatitis B Surface Ag: NEGATIVE

## 2018-01-19 LAB — COMPREHENSIVE METABOLIC PANEL
ALBUMIN: 1.6 g/dL — AB (ref 3.5–5.0)
ALK PHOS: 273 U/L — AB (ref 38–126)
ALT: 334 U/L — ABNORMAL HIGH (ref 0–44)
ANION GAP: 7 (ref 5–15)
AST: 242 U/L — ABNORMAL HIGH (ref 15–41)
BUN: 74 mg/dL — ABNORMAL HIGH (ref 8–23)
CALCIUM: 7.5 mg/dL — AB (ref 8.9–10.3)
CO2: 20 mmol/L — ABNORMAL LOW (ref 22–32)
Chloride: 113 mmol/L — ABNORMAL HIGH (ref 98–111)
Creatinine, Ser: 5.34 mg/dL — ABNORMAL HIGH (ref 0.61–1.24)
GFR calc Af Amer: 11 mL/min — ABNORMAL LOW (ref >60)
GFR, EST NON AFRICAN AMERICAN: 9 mL/min — AB (ref >60)
GLUCOSE: 110 mg/dL — AB (ref 70–99)
POTASSIUM: 4.3 mmol/L (ref 3.5–5.1)
SODIUM: 140 mmol/L (ref 135–145)
TOTAL PROTEIN: 4.6 g/dL — AB (ref 6.5–8.1)
Total Bilirubin: 0.6 mg/dL (ref 0.3–1.2)

## 2018-01-19 LAB — HEPARIN LEVEL (UNFRACTIONATED)
HEPARIN UNFRACTIONATED: 1.05 [IU]/mL — AB (ref 0.30–0.70)
Heparin Unfractionated: 0.76 IU/mL — ABNORMAL HIGH (ref 0.30–0.70)

## 2018-01-19 LAB — GLUCOSE, CAPILLARY
GLUCOSE-CAPILLARY: 83 mg/dL (ref 70–99)
GLUCOSE-CAPILLARY: 143 mg/dL — AB (ref 70–99)
GLUCOSE-CAPILLARY: 199 mg/dL — AB (ref 70–99)
Glucose-Capillary: 130 mg/dL — ABNORMAL HIGH (ref 70–99)

## 2018-01-19 LAB — APTT
APTT: 101 s — AB (ref 24–36)
APTT: 73 s — AB (ref 24–36)
APTT: 68 s — AB (ref 24–36)

## 2018-01-19 NOTE — Consult Note (Signed)
Gregory Darby, MD [address]  [place], [place] [address]  Main: [phone]  Fax: [phone] Pager: [phone]   Consultation  Referring Provider:     No ref. provider found Primary Care Physician:  [name], MD Primary Gastroenterologist:  Dr. Sherri Sear         Reason for Consultation:     Elevated LFTs  Date of Admission:  01/13/2018 Date of Consultation:  01/19/2018         HPI:   Gregory Tyler is a 77 y.o. Caucasian male who lives in a nursing home, bedbound, multiple comorbidities, atrial fibrillation on Eliquis, chronic kidney disease admitted to [facility] ICU on 9/23 secondary to septic shock from Pseudomonas UTI.  He developed acute on chronic renal failure. He was found to have significantly elevated LFTs including alkaline phosphatase, AST and ALT on admission and gradually worsened during ICU stay.  His LFTs peaked at AST 1136, ALT 685, alkaline phosphatase 443, total bilirubin 0.5.  Acute viral hepatitis panel was negative for hepatitis A, B and C.  He had ultrasound abdomen which was unremarkable for portal or hepatic vein thrombosis or cirrhosis of liver except for fatty liver.  Gradually, his LFTs started improving.  They got worse yesterday, therefore GI is consulted.  However, today his transaminases have come down.  Patient is coherent and able to provide history, feels significantly better compared to day 1.  He said he has been eating very well, his daughter and granddaughter are bedside. He denies any recent rash   NSAIDs: None  Antiplts/Anticoagulants/Anti thrombotics: Currently, he is on heparin drip, Eliquis as outpatient for A. fib  GI Procedures: Unknown  Past Medical History:  Diagnosis Date  . Anemia   . Anxiety   . Arthritis   . Cardiomyopathy ([place])    a. 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  Marland Kitchen Chronic combined systolic (congestive) and diastolic (congestive) heart failure ([place])    a.  05/2015 TEE: EF 35-40% (in setting of Afib).  . Chronic Pelvic pain   . Coronary artery disease    a. 05/2015 CTA Chest: 3 vessel coronary Ca2+.  . Depression   . Diabetes mellitus without complication ([place])   . Emphysema lung ([place])   . Gastric ulcer   . Gout   . Hyperlipidemia   . Hypertension   . Nephrolithiasis   . PAF (paroxysmal atrial fibrillation) ([place])    a. 05/2015 s/p DCCV; b. 03/2016 s/p DCCV; c. CHA2DS2VASc = 6 (coumadin).  . Prostate cancer [facility])    Prostate  . Stage III chronic kidney disease ([place])   . Weight loss    50 lb since Apr 24, 2015    Past Surgical History:  Procedure Laterality Date  . APPENDECTOMY    . BACK SURGERY    . CHOLECYSTECTOMY    . ELECTROPHYSIOLOGIC STUDY N/A 06/17/2015   Procedure: CARDIOVERSION;  Surgeon: Wellington Hampshire, MD;  Location: [facility] ORS;  Service: Cardiovascular;  Laterality: N/A;  . ELECTROPHYSIOLOGIC STUDY N/A 03/30/2016   Procedure: CARDIOVERSION;  Surgeon: Minna Merritts, MD;  Location: [facility] ORS;  Service: Cardiovascular;  Laterality: N/A;  . GASTRIC BYPASS    . HEMORRHOID SURGERY    . pilonidal cyst    . TEE WITHOUT CARDIOVERSION N/A 06/17/2015   Procedure: TRANSESOPHAGEAL ECHOCARDIOGRAM (TEE);  Surgeon: Wellington Hampshire, MD;  Location: [facility] ORS;  Service: Cardiovascular;  Laterality: N/A;  Prior to Admission medications   Medication Sig Start Date End Date Taking? Authorizing Provider  acetaminophen (TYLENOL) 325 MG tablet Take 2 tablets (650 mg total) by mouth every 6 (six) hours as needed for mild pain (or Fever >/= 101). 09/15/17  Yes Wieting, Richard, MD  allopurinol (ZYLOPRIM) 300 MG tablet Take 300 mg by mouth daily.   Yes [provider]  Elastic Bandages & Supports (MEDICAL COMPRESSION THIGH HIGH) MISC 1 application by Does not apply route daily. 09/11/17  Yes Merlyn Lot, MD  ELIQUIS 2.5 MG TABS tablet Take 2.5 mg by mouth every 12 (twelve) hours. 12/12/17  Yes [provider]  feeding  supplement, ENSURE ENLIVE, (ENSURE ENLIVE) LIQD Take 237 mLs by mouth 2 (two) times daily between meals. 09/15/17  Yes Wieting, Richard, MD  furosemide (LASIX) 20 MG tablet Take 20m in the morning and 20 mg in the afternoon 10/03/17  Yes Gollan, TKathlene November MD  gabapentin (NEURONTIN) 100 MG capsule Take 1 capsule (100 mg total) by mouth at bedtime. 09/15/17  Yes Wieting, Richard, MD  HYDROcodone-acetaminophen (NORCO/VICODIN) 5-325 MG tablet Take 1 tablet by mouth every 4 (four) hours as needed for moderate pain.   Yes [provider]  lisinopril (PRINIVIL,ZESTRIL) 5 MG tablet Take 1 tablet (5 mg total) by mouth daily. 10/03/17  Yes Gollan, TKathlene November MD  magnesium oxide (MAG-OX) 400 (241.3 Mg) MG tablet Take 400 mg by mouth daily.   Yes [provider]  magnesium oxide (MAG-OX) 400 MG tablet Take 400 mg by mouth daily.   Yes [provider]  metFORMIN (GLUCOPHAGE) 500 MG tablet Take 500 mg by mouth 2 (two) times daily with a meal.    Yes [provider]  metoprolol tartrate (LOPRESSOR) 50 MG tablet Take 50 mg by mouth 2 (two) times daily.   Yes [provider]  rosuvastatin (CRESTOR) 20 MG tablet Take 20 mg by mouth daily.   Yes [provider]    Current Facility-Administered Medications:  .  allopurinol (ZYLOPRIM) tablet 100 mg, 100 mg, Oral, Daily, SManuella Ghazi Rutul, MD, 100 mg at 01/19/18 1037 .  ceFEPIme (MAXIPIME) 1 g in sodium chloride 0.9 % 100 mL IVPB, 1 g, Intravenous, Q24H, Salary, Montell D, MD, Stopped at 01/18/18 2313 .  heparin ADULT infusion 100 units/mL (25000 units/2590msodium chloride 0.45%), 750 Units/hr, Intravenous, Continuous, Salary, Montell D, MD, Last Rate: 7.5 mL/hr at 01/19/18 1412, 750 Units/hr at 01/19/18 1412 .  insulin aspart (novoLOG) injection 0-9 Units, 0-9 Units, Subcutaneous, TID WC, SiWilhelmina McardleMD, 1 Units at 01/19/18 1213 .  levothyroxine (SYNTHROID, LEVOTHROID) injection 25 mcg, 25 mcg, Intravenous, Daily,  SiWilhelmina McardleMD, 25 mcg at 01/19/18 1038 .  midodrine (PROAMATINE) tablet 10 mg, 10 mg, Oral, TID WC, SiWilhelmina McardleMD, 10 mg at 01/19/18 1523 .  OLANZapine (ZYPREXA) tablet 2.5 mg, 2.5 mg, Oral, QHS, ShLahoma RockerMD, 2.5 mg at 01/18/18 2102 .  [DISCONTINUED] ondansetron (ZOFRAN) tablet 4 mg, 4 mg, Oral, Q6H PRN **OR** ondansetron (ZOFRAN) injection 4 mg, 4 mg, Intravenous, Q6H PRN, PaDustin FlockMD, 4 mg at 01/15/18 1058 .  polyethylene glycol (MIRALAX / GLYCOLAX) packet 17 g, 17 g, Oral, Daily PRN, ShLahoma RockerMD .  senna-docusate (Senokot-S) tablet 2 tablet, 2 tablet, Oral, BID, ShLahoma RockerMD, 2 tablet at 01/16/18 0847  Family History  Problem Relation Age of Onset  . CAD Mother   . Stroke Mother   . COPD Mother   . Heart  failure Mother   . Diabetes Other      Social History   Tobacco Use  . Smoking status: Former Smoker    Last attempt to quit: 12/19/1940    Years since quitting: 77.1  . Smokeless tobacco: Former Systems developer    Types: Chew    Quit date: 11/19/2014  Substance Use Topics  . Alcohol use: No  . Drug use: No    Allergies as of 01/13/2018 - Review Complete 01/13/2018  Allergen Reaction Noted  . Aspirin  05/10/2015  . Codeine Other (See Comments) 01/25/2014  . Codeine sulfate Other (See Comments) 01/25/2014    Review of Systems:    All systems reviewed and negative except where noted in HPI.   Physical Exam:  Vital signs in last 24 hours: Temp:  [98 F (36.7 C)-98.3 F (36.8 C)] 98 F (36.7 C) (09/29 1138) Pulse Rate:  [62-66] 62 (09/29 1138) Resp:  [16] 16 (09/29 1138) BP: (98-126)/(59-61) 100/59 (09/29 1138) SpO2:  [89 %-100 %] 100 % (09/29 1138) Last BM Date: 01/19/18 General:   Pleasant, cooperative in NAD, thin built Head:  Normocephalic and atraumatic. Eyes:   No icterus.   Conjunctiva pink. PERRLA. Ears:  Normal auditory acuity. Neck:  Supple; no masses or thyroidomegaly Lungs: Respirations even and unlabored. Lungs clear to  auscultation bilaterally.   No wheezes, crackles, or rhonchi.  Heart:  Regular rate and rhythm;  Without murmur, clicks, rubs or gallops Abdomen:  Soft, nondistended, nontender. Normal bowel sounds. No appreciable masses or hepatomegaly.  No rebound or guarding.  Rectal:  Not performed. Msk:  Symmetrical without gross deformities Extremities: Dressings in bilateral feet from peripheral vascular disease Neurologic:  Alert and oriented x3;  grossly normal neurologically. Skin:  Intact without significant lesions or rashes. Cervical Nodes:  No significant cervical adenopathy. Psych:  Alert and cooperative. Normal affect.  LAB RESULTS: CBC Latest Ref Rng & Units 01/19/2018 01/18/2018 01/16/2018  WBC 3.8 - 10.6 K/uL 7.5 7.6 7.9  Hemoglobin 13.0 - 18.0 g/dL 7.8(L) 9.4(L) 9.0(L)  Hematocrit 40.0 - 52.0 % 22.4(L) 27.2(L) 25.7(L)  Platelets 150 - 440 K/uL 176 205 241    BMET BMP Latest Ref Rng & Units 01/19/2018 01/18/2018 01/17/2018  Glucose 70 - 99 mg/dL 110(H) 107(H) 142(H)  BUN 8 - 23 mg/dL 74(H) 79(H) 80(H)  Creatinine 0.61 - 1.24 mg/dL 5.34(H) 5.43(H) 4.77(H)  BUN/Creat Ratio 10 - 24 - - -  Sodium 135 - 145 mmol/L 140 144 141  Potassium 3.5 - 5.1 mmol/L 4.3 4.6 4.5  Chloride 98 - 111 mmol/L 113(H) 113(H) 113(H)  CO2 22 - 32 mmol/L 20(L) 21(L) 20(L)  Calcium 8.9 - 10.3 mg/dL 7.5(L) 7.9(L) 6.9(L)    LFT Hepatic Function Latest Ref Rng & Units 01/19/2018 01/18/2018 01/17/2018  Total Protein 6.5 - 8.1 g/dL 4.6(L) 5.4(L) 4.6(L)  Albumin 3.5 - 5.0 g/dL 1.6(L) 1.9(L) 1.8(L)  AST 15 - 41 U/L 242(H) 590(H) 439(H)  ALT 0 - 44 U/L 334(H) 553(H) 412(H)  Alk Phosphatase 38 - 126 U/L 273(H) 359(H) 303(H)  Total Bilirubin 0.3 - 1.2 mg/dL 0.6 0.7 0.5  Bilirubin, Direct 0.0 - 0.2 mg/dL - - -     STUDIES: No results found.    Impression / Plan:   PINKNEY VENARD is a 77 y.o. Caucasian male with cardiomyopathy, A. Fib on Eliquis, lives in nursing home, functionally dependent admitted with septic  shock secondary to Pseudomonas UTI and was found to have significantly elevated LFTs.  His LFTs presentation  is hepatocellular pattern and consistent with shock liver.  There is no evidence of biliary obstruction. His LFTs are downtrending as he has recovered from the shock.  Hepatitis A antibody, hepatitis B surface antigen, hep B core antibody IgM were negative, HCV antibody was negative Ultrasound abdomen revealed possible fatty liver disease only  -Do not recommend any further work-up at this time as LFTs are already improving Patient and family reassured  Thank you for involving me in the care of this patient.   Dr. Bonna Gains to cover from tomorrow    LOS: 6 days   Sherri Sear, MD  01/19/2018, 4:21 PM   Note: This dictation was prepared with Dragon dictation along with smaller phrase technology. Any transcriptional errors that result from this process are unintentional.

## 2018-01-19 NOTE — Progress Notes (Signed)
[facility], [place] 01/19/18  Subjective:   Patient states that he overall is feels better compared to when he first came in.  He is able to eat without nausea or vomiting.  Appetite is getting better.  Constipation has resolved S Creatinine still critically high at 5.34 although slightly improved from yesterday 5.43.  Urine output 1000 cc,   Patient's son, daughter and granddaughter are in the room with him    Objective:  Vital signs in last 24 hours:  Temp:  [98 F (36.7 C)-98.3 F (36.8 C)] 98 F (36.7 C) (09/29 1138) Pulse Rate:  [62-66] 62 (09/29 1138) Resp:  [16] 16 (09/29 1138) BP: (98-126)/(59-61) 100/59 (09/29 1138) SpO2:  [89 %-100 %] 100 % (09/29 1138)  Weight change:  Filed Weights   01/13/18 1327  Weight: 67.5 kg    Intake/Output:    Intake/Output Summary (Last 24 hours) at 01/19/2018 1313 Last data filed at 01/19/2018 1043 Gross per 24 hour  Intake 2100.26 ml  Output 1225 ml  Net 875.26 ml     Physical Exam: General:  Chronically ill-appearing, laying in the bed, thin cachectic  HEENT  dry oral mucous membranes  Neck  supple  Pulm/lungs  decreased breath sounds at bases, room air   CVS/Heart  irregular, tachycardic  Abdomen:   Soft, nondistended,   Extremities:  + dependent peripheral edema, bilateral feet in bandages  Neurologic:  Alert, able to answer few questions,    Basic Metabolic Panel:  Recent Labs  Lab 01/14/18 0349  01/15/18 0520 01/16/18 0513 01/17/18 0558 01/18/18 0818 01/18/18 0820 01/19/18 0555  NA 136  --  137 140 141  --  144 140  K 5.8*   < > 4.9 4.8 4.5  --  4.6 4.3  CL 105  --  107 108 113*  --  113* 113*  CO2 19*  --  21* 21* 20*  --  21* 20*  GLUCOSE 165*  --  134* 150* 142*  --  107* 110*  BUN 85*  --  87* 84* 80*  --  79* 74*  CREATININE 3.75*  --  4.23* 4.78* 4.77*  --  5.43* 5.34*  CALCIUM 7.5*  --  7.1* 7.0* 6.9*  --  7.9* 7.5*  MG 1.9  --  1.9 1.9  --  2.0  --   --   PHOS 6.5*  --   6.2*  --   --  5.9*  --   --    < > = values in this interval not displayed.     CBC: Recent Labs  Lab 01/13/18 1339 01/14/18 0349 01/15/18 0520 01/16/18 0513 01/18/18 0818 01/19/18 0555  WBC 15.6* 18.6* 12.9* 7.9 7.6 7.5  NEUTROABS 13.7* 16.5* 10.3* 5.7  --   --   HGB 10.3* 10.3* 9.5* 9.0* 9.4* 7.8*  HCT 30.8* 30.4* 27.4* 25.7* 27.2* 22.4*  MCV 94.2 93.2 92.7 92.8 93.7 94.1  PLT 389 368 294 241 205 176      Lab Results  Component Value Date   HEPBSAG Negative 01/18/2018   HEPBIGM Negative 01/18/2018      Microbiology:  Recent Results (from the past 240 hour(s))  Blood culture (routine x 2)     Status: None   Collection Time: 01/13/18  1:39 PM  Result Value Ref Range Status   Specimen Description RIGHT ANTECUBITAL  Final   Special Requests   Final    BOTTLES DRAWN AEROBIC AND ANAEROBIC Blood Culture adequate  volume   Culture   Final    NO GROWTH 5 DAYS Performed at [facility], [address] [place], [place] [address]    Report Status 01/18/2018 FINAL  Final  Blood culture (routine x 2)     Status: None   Collection Time: 01/13/18  1:39 PM  Result Value Ref Range Status   Specimen Description LEFT ANTECUBITAL  Final   Special Requests   Final    BOTTLES DRAWN AEROBIC AND ANAEROBIC Blood Culture results may not be optimal due to an excessive volume of blood received in culture bottles   Culture   Final    NO GROWTH 5 DAYS Performed at [facility], [address], [place], [place] [address]    Report Status 01/18/2018 FINAL  Final  Urine Culture     Status: Abnormal   Collection Time: 01/13/18  1:39 PM  Result Value Ref Range Status   Specimen Description   Final    URINE, RANDOM Performed at [facility], [address] [place], [place] [address]    Special Requests   Final    NONE Performed at [facility], [address], [place], [place] [address]    Culture >=100,000 COLONIES/mL PSEUDOMONAS AERUGINOSA  (A)  Final   Report Status 01/16/2018 FINAL  Final   Organism ID, Bacteria PSEUDOMONAS AERUGINOSA (A)  Final      Susceptibility   Pseudomonas aeruginosa - MIC*    CEFTAZIDIME 4 SENSITIVE Sensitive     CIPROFLOXACIN <=0.25 SENSITIVE Sensitive     GENTAMICIN <=1 SENSITIVE Sensitive     IMIPENEM 1 SENSITIVE Sensitive     PIP/TAZO <=4 SENSITIVE Sensitive     CEFEPIME 2 SENSITIVE Sensitive     * >=100,000 COLONIES/mL PSEUDOMONAS AERUGINOSA  MRSA PCR Screening     Status: None   Collection Time: 01/13/18  5:13 PM  Result Value Ref Range Status   MRSA by PCR NEGATIVE NEGATIVE Final    Comment:        The GeneXpert MRSA Assay (FDA approved for NASAL specimens only), is one component of a comprehensive MRSA colonization surveillance program. It is not intended to diagnose MRSA infection nor to guide or monitor treatment for MRSA infections. Performed at [facility], [address] [place], [place] [address]     Coagulation Studies: Recent Labs    01/18/18 1515  LABPROT 17.6*  INR 1.46    Urinalysis: No results for input(s): COLORURINE, LABSPEC, PHURINE, GLUCOSEU, HGBUR, BILIRUBINUR, KETONESUR, PROTEINUR, UROBILINOGEN, NITRITE, LEUKOCYTESUR in the last 72 hours.  Invalid input(s): APPERANCEUR    Imaging: No results found.   Medications:   . ceFEPime (MAXIPIME) IV Stopped (01/18/18 2313)  . heparin 750 Units/hr (01/19/18 0500)   . allopurinol  100 mg Oral Daily  . insulin aspart  0-9 Units Subcutaneous TID WC  . levothyroxine  25 mcg Intravenous Daily  . midodrine  10 mg Oral TID WC  . OLANZapine  2.5 mg Oral QHS  . senna-docusate  2 tablet Oral BID   [DISCONTINUED] ondansetron **OR** ondansetron (ZOFRAN) IV, polyethylene glycol  Assessment/ Plan:  77 y.o. Caucasian male with medical problems of chronic systolic congestive heart failure, diabetes type 2, atrial fibrillation, hypertension, history of gastric ulcer, coronary disease, who was admitted to  [facility] on 01/13/2018 for evaluation of altered mental status.   1.  Acute renal failure on chronic kidney disease stage III Baseline creatinine 1.30/GFR 51 from December 20, 2017 Admission creatinine 3.86, still  critically high at 5.34 The cause of acute renal failure appears to be multifactorial including hypotension, sepsis, likely leading to ATN. Renal u/s shows left non obstructing stone 5 mm; no hydronephrosis  Since there has been clinical improvement and appetite has improved, we will continue to monitor closely. No acute indication for dialysis at present. Discussed with patient's son and daughter today. . If patient is discharged to [facility], we will see him as outpatient this week.  2.  UTI and sepsis, Pseudomonas aeuroginosa Patient is currently getting broad-spectrum antibiotics.   3. Hyperkalemia - currently in normal range -Discontinue IV fluid supplementation     LOS: 6 Latifa Noble 9/29/20191:13 PM  [place] [place]  Note: This note was prepared with Dragon dictation. Any transcription errors are unintentional

## 2018-01-19 NOTE — Progress Notes (Signed)
ANTICOAGULATION CONSULT NOTE   Pharmacy Consult for Heparin Drip Management  Indication: atrial fibrillation  Allergies  Allergen Reactions  . Aspirin     GI Bleeding  . Codeine Other (See Comments)    BLISTERS Other reaction(s): Other (See Comments) BLISTERS   . Codeine Sulfate Other (See Comments)    Other reaction(s): Other (See Comments) BLISTERS BLISTERS    Patient Measurements: Height: 5\' 8"  (172.7 cm) Weight: 148 lb 13 oz (67.5 kg) IBW/kg (Calculated) : 68.4  Vital Signs: Temp: 98 F (36.7 C) (09/29 1138) Temp Source: Oral (09/29 1138) BP: 100/59 (09/29 1138) Pulse Rate: 62 (09/29 1138)  Labs: Recent Labs    01/17/18 0558 01/18/18 0818  01/18/18 0820 01/18/18 1515 01/19/18 0103 01/19/18 0555 01/19/18 1103  HGB  --  9.4*  --   --   --   --  7.8*  --   HCT  --  27.2*  --   --   --   --  22.4*  --   PLT  --  205  --   --   --   --  176  --   APTT  --   --    < > 102* 111* 101*  --  73*  LABPROT  --   --   --   --  17.6*  --   --   --   INR  --   --   --   --  1.46  --   --   --   HEPARINUNFRC  --   --   --   --   --   --  1.05*  --   CREATININE 4.77*  --   --  5.43*  --   --  5.34*  --    < > = values in this interval not displayed.    Estimated Creatinine Clearance: 10.9 mL/min (A) (by C-G formula based on SCr of 5.34 mg/dL (H)).   Medical History: Past Medical History:  Diagnosis Date  . Anemia   . Anxiety   . Arthritis   . Cardiomyopathy ([place])    a. 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  Marland Kitchen Chronic combined systolic (congestive) and diastolic (congestive) heart failure ([place])    a. 05/2015 TEE: EF 35-40% (in setting of Afib).  . Chronic Pelvic pain   . Coronary artery disease    a. 05/2015 CTA Chest: 3 vessel coronary Ca2+.  . Depression   . Diabetes mellitus without complication ([place])   . Emphysema lung ([place])   . Gastric ulcer   . Gout   . Hyperlipidemia   . Hypertension   . Nephrolithiasis   . PAF (paroxysmal  atrial fibrillation) ([place])    a. 05/2015 s/p DCCV; b. 03/2016 s/p DCCV; c. CHA2DS2VASc = 6 (coumadin).  . Prostate cancer [facility])    Prostate  . Stage III chronic kidney disease ([place])   . Weight loss    50 lb since Apr 24, 2015    Medications:  Scheduled:  . allopurinol  100 mg Oral Daily  . insulin aspart  0-9 Units Subcutaneous TID WC  . levothyroxine  25 mcg Intravenous Daily  . midodrine  10 mg Oral TID WC  . OLANZapine  2.5 mg Oral QHS  . senna-docusate  2 tablet Oral BID   Infusions:  . ceFEPime (MAXIPIME) IV Stopped (01/18/18 2313)  . heparin 750 Units/hr (01/19/18 0500)    Assessment: Pharmacy consulted  for heparin drip management for 77 yo male previously ordered home dose of apixaban 2.5mg  PO BID. Patient with acute renal failure. Last dose of apixaban at 1200 on 9/27.   Home dose of apixaban 2.5mg  BID due to bleeding issues as an outpatient per son.   09/29 @ 0100 aPTT 101 seconds therapeutic, but borderline.Decreases rate to 750 units/hr.   Goal of Therapy:  Heparin level 0.3-0.7 units/ml  APTT 66-102s  Monitor platelets by anticoagulation protocol: Yes   Plan:  9/29 @ 1103 aPTT 73. Level is therapeutic. Will continue current infusion rate of heparin 750units/hr. Recheck aPTT and Anti-Xa level in 8 hours.  CBC and anti-Xa level with AM labs protocol.   Lanell Carpenter M Marlean Mortell, Pharm.D., BCPS Clinical Pharmacist 01/19/2018,11:40 AM

## 2018-01-19 NOTE — Progress Notes (Signed)
ANTICOAGULATION CONSULT NOTE   Pharmacy Consult for Heparin Drip Management  Indication: atrial fibrillation  Allergies  Allergen Reactions  . Aspirin     GI Bleeding  . Codeine Other (See Comments)    BLISTERS Other reaction(s): Other (See Comments) BLISTERS   . Codeine Sulfate Other (See Comments)    Other reaction(s): Other (See Comments) BLISTERS BLISTERS    Patient Measurements: Height: 5\' 8"  (172.7 cm) Weight: 148 lb 13 oz (67.5 kg) IBW/kg (Calculated) : 68.4  Vital Signs: Temp: 98.3 F (36.8 C) (09/28 2031) Temp Source: Oral (09/28 2031) BP: 98/59 (09/28 2031) Pulse Rate: 66 (09/28 2031)  Labs: Recent Labs    01/16/18 0513 01/17/18 0558 01/18/18 0818 01/18/18 0820 01/18/18 1515 01/19/18 0103  HGB 9.0*  --  9.4*  --   --   --   HCT 25.7*  --  27.2*  --   --   --   PLT 241  --  205  --   --   --   APTT  --   --   --  102* 111* 101*  LABPROT  --   --   --   --  17.6*  --   INR  --   --   --   --  1.46  --   CREATININE 4.78* 4.77*  --  5.43*  --   --     Estimated Creatinine Clearance: 10.7 mL/min (A) (by C-G formula based on SCr of 5.43 mg/dL (H)).   Medical History: Past Medical History:  Diagnosis Date  . Anemia   . Anxiety   . Arthritis   . Cardiomyopathy ([place])    a. 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  Marland Kitchen Chronic combined systolic (congestive) and diastolic (congestive) heart failure ([place])    a. 05/2015 TEE: EF 35-40% (in setting of Afib).  . Chronic Pelvic pain   . Coronary artery disease    a. 05/2015 CTA Chest: 3 vessel coronary Ca2+.  . Depression   . Diabetes mellitus without complication ([place])   . Emphysema lung ([address])   . Gastric ulcer   . Gout   . Hyperlipidemia   . Hypertension   . Nephrolithiasis   . PAF (paroxysmal atrial fibrillation) ([place])    a. 05/2015 s/p DCCV; b. 03/2016 s/p DCCV; c. CHA2DS2VASc = 6 (coumadin).  . Prostate cancer [facility])    Prostate  . Stage III chronic kidney disease ([place])   .  Weight loss    50 lb since Apr 24, 2015    Medications:  Scheduled:  . allopurinol  100 mg Oral Daily  . insulin aspart  0-9 Units Subcutaneous TID WC  . levothyroxine  25 mcg Intravenous Daily  . midodrine  10 mg Oral TID WC  . OLANZapine  2.5 mg Oral QHS  . senna-docusate  2 tablet Oral BID   Infusions:  . ceFEPime (MAXIPIME) IV 1 g (01/18/18 2103)  . heparin 850 Units/hr (01/19/18 0034)  . lactated ringers 50 mL/hr at 01/19/18 6384    Assessment: Pharmacy consulted for heparin drip management for 77 yo male previously ordered home dose of apixaban 2.5mg  PO BID. Patient with acute renal failure. Last dose of apixaban at 1200 on 9/27.   Home dose of apixaban 2.5mg  BID due to bleeding issues as an outpatient per son.   Goal of Therapy:  Heparin level 0.3-0.7 units/ml  APTT 66-102s  Monitor platelets by anticoagulation protocol: Yes  Plan:  09/29 @ 0100 aPTT 101 seconds therapeutic, but borderline. Will decrease rate to 750 units/hr and will recheck aPTT @ 1100, will check CBC w/ am labs.  Tobie Lords, Pharm.D., BCPS Clinical Pharmacist 01/19/2018,3:22 AM

## 2018-01-19 NOTE — Progress Notes (Signed)
ANTICOAGULATION CONSULT NOTE   Pharmacy Consult for Heparin Drip Management  Indication: atrial fibrillation  Allergies  Allergen Reactions  . Aspirin     GI Bleeding  . Codeine Other (See Comments)    BLISTERS Other reaction(s): Other (See Comments) BLISTERS   . Codeine Sulfate Other (See Comments)    Other reaction(s): Other (See Comments) BLISTERS BLISTERS    Patient Measurements: Height: 5\' 8"  (172.7 cm) Weight: 148 lb 13 oz (67.5 kg) IBW/kg (Calculated) : 68.4  Vital Signs: Temp: 98 F (36.7 C) (09/29 1138) Temp Source: Oral (09/29 1138) BP: 100/59 (09/29 1138) Pulse Rate: 62 (09/29 1138)  Labs: Recent Labs    01/17/18 0558 01/18/18 0818  01/18/18 0820 01/18/18 1515 01/19/18 0103 01/19/18 0555 01/19/18 1103 01/19/18 1856  HGB  --  9.4*  --   --   --   --  7.8*  --   --   HCT  --  27.2*  --   --   --   --  22.4*  --   --   PLT  --  205  --   --   --   --  176  --   --   APTT  --   --    < > 102* 111* 101*  --  73* 68*  LABPROT  --   --   --   --  17.6*  --   --   --   --   INR  --   --   --   --  1.46  --   --   --   --   HEPARINUNFRC  --   --   --   --   --   --  1.05*  --  0.76*  CREATININE 4.77*  --   --  5.43*  --   --  5.34*  --   --    < > = values in this interval not displayed.    Estimated Creatinine Clearance: 10.9 mL/min (A) (by C-G formula based on SCr of 5.34 mg/dL (H)).   Medical History: Past Medical History:  Diagnosis Date  . Anemia   . Anxiety   . Arthritis   . Cardiomyopathy ([place])    a. 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  Marland Kitchen Chronic combined systolic (congestive) and diastolic (congestive) heart failure ([place])    a. 05/2015 TEE: EF 35-40% (in setting of Afib).  . Chronic Pelvic pain   . Coronary artery disease    a. 05/2015 CTA Chest: 3 vessel coronary Ca2+.  . Depression   . Diabetes mellitus without complication ([place])   . Emphysema lung ([place])   . Gastric ulcer   . Gout   . Hyperlipidemia   .  Hypertension   . Nephrolithiasis   . PAF (paroxysmal atrial fibrillation) ([place])    a. 05/2015 s/p DCCV; b. 03/2016 s/p DCCV; c. CHA2DS2VASc = 6 (coumadin).  . Prostate cancer [facility])    Prostate  . Stage III chronic kidney disease ([place])   . Weight loss    50 lb since Apr 24, 2015    Medications:  Scheduled:  . allopurinol  100 mg Oral Daily  . insulin aspart  0-9 Units Subcutaneous TID WC  . levothyroxine  25 mcg Intravenous Daily  . midodrine  10 mg Oral TID WC  . OLANZapine  2.5 mg Oral QHS  . senna-docusate  2 tablet Oral BID  Infusions:  . ceFEPime (MAXIPIME) IV Stopped (01/18/18 2313)  . heparin 750 Units/hr (01/19/18 1900)    Assessment: Pharmacy consulted for heparin drip management for 77 yo male previously ordered home dose of apixaban 2.5mg  PO BID. Patient with acute renal failure. Last dose of apixaban at 1200 on 9/27.   Home dose of apixaban 2.5mg  BID due to bleeding issues as an outpatient per son.   09/29 @ 0100 aPTT 101 seconds therapeutic, but borderline.Decreases rate to 750 units/hr.   Goal of Therapy:  Heparin level 0.3-0.7 units/ml  APTT 66-102s  Monitor platelets by anticoagulation protocol: Yes   Plan:  9/29 @ 1103 aPTT 73. Level is therapeutic. Will continue current infusion rate of heparin 750units/hr. Recheck aPTT and Anti-Xa level in 8 hours.  CBC and anti-Xa level with AM labs protocol.   9/29:  APTT @ 1900 = 68 .    Will continue this pt on current rate and recheck HL and aPTT on 9/30 with AM labs.   Renelle Stegenga D, Pharm.D Clinical Pharmacist 01/19/2018,7:52 PM

## 2018-01-19 NOTE — Plan of Care (Signed)
On heparin drip.no complaints voiced ,no visual distress noted at present.[place] within reach.

## 2018-01-19 NOTE — Progress Notes (Addendum)
[place] [facility] [place] NAME: Gregory Tyler    MR#:  [id number]  DATE OF BIRTH:  1940/12/20  SUBJECTIVE:  CHIEF COMPLAINT:   Chief Complaint  Patient presents with  . Altered Mental Status  Patient without complaint, and discussion with Dr. Lateef/nephrology-considering hemodialysis in near future   REVIEW OF SYSTEMS:  CONSTITUTIONAL: No fever, fatigue or weakness.  EYES: No blurred or double vision.  EARS, NOSE, AND THROAT: No tinnitus or ear pain.  RESPIRATORY: No cough, shortness of breath, wheezing or hemoptysis.  CARDIOVASCULAR: No chest pain, orthopnea, edema.  GASTROINTESTINAL: No nausea, vomiting, diarrhea or abdominal pain.  GENITOURINARY: No dysuria, hematuria.  ENDOCRINE: No polyuria, nocturia,  HEMATOLOGY: No anemia, easy bruising or bleeding SKIN: No rash or lesion. MUSCULOSKELETAL: No joint pain or arthritis.   NEUROLOGIC: No tingling, numbness, weakness.  PSYCHIATRY: No anxiety or depression.   ROS  DRUG ALLERGIES:   Allergies  Allergen Reactions  . Aspirin     GI Bleeding  . Codeine Other (See Comments)    BLISTERS Other reaction(s): Other (See Comments) BLISTERS   . Codeine Sulfate Other (See Comments)    Other reaction(s): Other (See Comments) BLISTERS BLISTERS    VITALS:  Blood pressure (!) 100/59, pulse 62, temperature 98 F (36.7 C), temperature source Oral, resp. rate 16, height 5\' 8"  (1.727 m), weight 67.5 kg, SpO2 100 %.  PHYSICAL EXAMINATION:  GENERAL:  77 y.o.-year-old patient lying in the bed with no acute distress.  EYES: Pupils equal, round, reactive to light and accommodation. No scleral icterus. Extraocular muscles intact.  HEENT: Head atraumatic, normocephalic. Oropharynx and nasopharynx clear.  NECK:  Supple, no jugular venous distention. No thyroid enlargement, no tenderness.  LUNGS: Normal breath sounds bilaterally, no wheezing, rales,rhonchi or crepitation. No use of accessory muscles  of respiration.  CARDIOVASCULAR: S1, S2 normal. No murmurs, rubs, or gallops.  ABDOMEN: Soft, nontender, nondistended. Bowel sounds present. No organomegaly or mass.  EXTREMITIES: No pedal edema, cyanosis, or clubbing.  NEUROLOGIC: Cranial nerves II through XII are intact. Muscle strength 5/5 in all extremities. Sensation intact. Gait not checked.  PSYCHIATRIC: The patient is alert and oriented x 3.  SKIN: No obvious rash, lesion, or ulcer.   Physical Exam LABORATORY PANEL:   CBC Recent Labs  Lab 01/19/18 0555  WBC 7.5  HGB 7.8*  HCT 22.4*  PLT 176   ------------------------------------------------------------------------------------------------------------------  Chemistries  Recent Labs  Lab 01/18/18 0818  01/19/18 0555  NA  --    < > 140  K  --    < > 4.3  CL  --    < > 113*  CO2  --    < > 20*  GLUCOSE  --    < > 110*  BUN  --    < > 74*  CREATININE  --    < > 5.34*  CALCIUM  --    < > 7.5*  MG 2.0  --   --   AST  --    < > 242*  ALT  --    < > 334*  ALKPHOS  --    < > 273*  BILITOT  --    < > 0.6   < > = values in this interval not displayed.   ------------------------------------------------------------------------------------------------------------------  Cardiac Enzymes Recent Labs  Lab 01/13/18 1951 01/14/18 0349  TROPONINI 0.04* 0.05*   ------------------------------------------------------------------------------------------------------------------  RADIOLOGY:  No results found.  ASSESSMENT AND PLAN:  IMPRESSION AND  PLAN: Patient 77 year old nursing home due to altered mental status  *Acute encephalopathy due to sepsis Resolved  *Acute septic shock due to Pseudomonas UTI  Resolved Successfully weaned off pressors Continue cefepime for 5-7-day course  *Acute renal failure  Stable  In discussion with nephrology/Dr. Lateef-reconsidering hemodialysis in the near future, will hold discharge   *Acute transaminitis  Resolving Suspected  due to sepsis/acute shock liver GI did see patient while in house-no intervention recommended, continue to avoid hepatotoxic agents, hepatic panel negative, abdominal ultrasound per radiology noted for fatty liver/hepatocellular disease    *Chronic systolic CHF without exacerbation repeat echo shows normal EF Stable on current regiment  *Chronic diabetes mellitus type 2 Stable on current regiment  *Hypertension  Normotensive without antihypertensive medications   *Paroxysmal atrial fibrillation  Continue heparin drip for now-can change to Eliquis on discharge  Discharge to skilled nursing facility once cleared by nephrology  All the records are reviewed and case discussed with Care Management/Social Workerr. Management plans discussed with the patient, family and they are in agreement.  CODE STATUS: dnr  TOTAL TIME TAKING CARE OF THIS PATIENT: 35 minutes.     POSSIBLE D/C IN 2-3 DAYS, DEPENDING ON CLINICAL CONDITION.   [name] M.D on 01/19/2018   Between 7am to 6pm - Pager - [phone]  After 6pm go to [email or web address] - password [email or web address] [facility]  Office  [phone]  CC: Primary care physician; Gregory Crouch, MD  Note: This dictation was prepared with Dragon dictation along with smaller phrase technology. Any transcriptional errors that result from this process are unintentional.

## 2018-01-19 NOTE — Progress Notes (Signed)
Family Meeting Note  Advance Directive:yes  Today a meeting took place with the Patient.  Patient is able to participate   The following clinical team members were present during this meeting:MD  The following were discussed:Patient's diagnosis: Sepsis, shock liver, acute renal failure, diabetes, heart failure, obstructive sleep apnea, Patient's progosis: Unable to determine and Goals for treatment: Full Code  Additional follow-up to be provided: prn  Time spent during discussion:20 minutes  Gorden Harms, MD

## 2018-01-19 NOTE — Clinical Social Work Note (Signed)
Clinical Social Work Assessment  Patient Details  Name: Gregory Tyler MRN: [id number] Date of Birth: 08-02-1940  Date of referral:  01/19/18               Reason for consult:  Facility Placement                Permission sought to share information with:  Chartered certified accountant granted to share information::  Yes, Verbal Permission Granted  Name::        Agency::  [facility]  Relationship::     Contact Information:     Housing/Transportation Living arrangements for the past 2 months:  [place] of Information:  Patient, Scientist, water quality, Facility Patient Interpreter Needed:  None Criminal Activity/Legal Involvement Pertinent to Current Situation/Hospitalization:  No - Comment as needed Significant Relationships:  Warehouse manager, Adult Children Lives with:  Facility Resident Do you feel safe going back to the place where you live?  Yes Need for family participation in patient care:  No (Coment)  Care giving concerns:  Patient admitted from [facility]   Social Worker assessment / plan:  The CSW met with the patient at bedside to discuss discharge planning. The patient shared that he is feeling much better and is eager to return to [facility] where his wife also resides. The patient was concerned that an appointment to see Dr. Candiss Norse had not been made; the CSW confirmed the appointment for the patient.  The CSW confirmed with [facility] that the patient is a LTC resident and can return when stable. The CSW will follow for discharge facilitation.    Employment status:  Retired Forensic scientist:  Medicare PT Recommendations:  Not assessed at this time Information / Referral to community resources:     Patient/Family's Response to care:  The patient thanked the CSW.  Patient/Family's Understanding of and Emotional Response to Diagnosis, Current Treatment, and Prognosis:  The patient understands and agrees with discharge  plan.  Emotional Assessment Appearance:  Appears stated age Attitude/Demeanor/Rapport:  Gracious, Charismatic, Engaged Affect (typically observed):  Appropriate, Pleasant Orientation:  Oriented to Self, Oriented to Place, Oriented to  Time, Oriented to Situation Alcohol / Substance use:    Psych involvement (Current and /or in the community):  No (Comment)  Discharge Needs  Concerns to be addressed:  Care Coordination, Discharge Planning Concerns Readmission within the last 30 days:  Yes Current discharge risk:  Chronically ill Barriers to Discharge:  Continued Medical Work up   [employer], LCSW 01/19/2018, 4:39 PM

## 2018-01-19 NOTE — NC FL2 (Signed)
[place] LEVEL OF CARE SCREENING TOOL     IDENTIFICATION  Patient Name: Gregory Tyler Birthdate: [birth year] Sex: male Admission Date (Current Location): 01/13/2018  [place] and [place] Number:  Engineering geologist and Address:  [facility], [address] [place], [place] [address]      Provider Number: [id number]  Attending Physician Name and Address:  Gorden Harms, MD  Relative Name and Phone Number:  [facility]) [phone] or Lynnell Chad (Daughter) [phone]    Current Level of Care: Hospital Recommended Level of Care: [place] Prior Approval Number:    Date Approved/Denied:   PASRR Number: [id number]  Discharge Plan: SNF    Current Diagnoses: Patient Active Problem List   Diagnosis Date Noted  . Sepsis ([place]) 01/13/2018  . 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 Diarrhea 12/19/2017  . Pressure injury of skin 11/16/2017  . Enteritis 11/15/2017  . Malnutrition of moderate degree 09/13/2017  . Elevated INR 09/02/2017  . Paroxysmal atrial fibrillation ([place]) 10/25/2016  . Chronic fatigue   . Encounter for anticoagulation discussion and counseling 09/21/2015  . Hyperlipidemia 08/08/2015  . Diabetes ([place]) 05/19/2015  . Hyperkalemia   . Dehydration   . Orthostatic hypotension 05/09/2015  . Congestive dilated cardiomyopathy ([place]) 05/09/2015  . Polypharmacy 04/27/2015  . Chronic diastolic CHF (congestive heart failure) ([place]) 04/25/2015  . SOB (shortness of breath)   . Coronary artery disease involving native coronary artery of native heart without angina pectoris   . Essential hypertension     Orientation RESPIRATION BLADDER Height & Weight     Self, Situation, Time, Place  Normal Incontinent Weight: 148 lb 13 oz (67.5 kg) Height:  5\' 8"  (172.7 cm)  BEHAVIORAL SYMPTOMS/MOOD NEUROLOGICAL BOWEL NUTRITION STATUS      Continent Diet(Carb modified)  AMBULATORY STATUS  COMMUNICATION OF NEEDS Skin   Extensive Assist Verbally Normal                       Personal Care Assistance Level of Assistance  Bathing, Feeding, Dressing Bathing Assistance: Maximum assistance Feeding assistance: Independent Dressing Assistance: Maximum assistance     Functional Limitations Info  Sight, Hearing, Speech Sight Info: Adequate Hearing Info: Adequate Speech Info: Adequate    SPECIAL CARE FACTORS FREQUENCY                       Contractures Contractures Info: Not present    Additional Factors Info  Code Status, Allergies Code Status Info: Full Allergies Info: Aspirin, Codeine, Codeine Sulfate           Current Medications (01/19/2018):  This is the current hospital active medication list Current Facility-Administered Medications  Medication Dose Route Frequency Provider Last Rate Last Dose  . allopurinol (ZYLOPRIM) tablet 100 mg  100 mg Oral Daily [name], MD   100 mg at 01/19/18 1037  . ceFEPIme (MAXIPIME) 1 g in sodium chloride 0.9 % 100 mL IVPB  1 g Intravenous Q24H Salary, Montell D, MD   Stopped at 01/18/18 2313  . heparin ADULT infusion 100 units/mL (25000 units/260mL sodium chloride 0.45%)  750 Units/hr Intravenous Continuous Salary, Montell D, MD 7.5 mL/hr at 01/19/18 1412 750 Units/hr at 01/19/18 1412  . insulin aspart (novoLOG) injection 0-9 Units  0-9 Units Subcutaneous TID WC Wilhelmina Mcardle, MD   1 Units at 01/19/18 1213  . levothyroxine (SYNTHROID, LEVOTHROID) injection 25 mcg  25 mcg Intravenous Daily  Wilhelmina Mcardle, MD   25 mcg at 01/19/18 1038  . midodrine (PROAMATINE) tablet 10 mg  10 mg Oral TID WC Wilhelmina Mcardle, MD   10 mg at 01/19/18 1523  . OLANZapine (ZYPREXA) tablet 2.5 mg  2.5 mg Oral QHS [name], MD   2.5 mg at 01/18/18 2102  . ondansetron (ZOFRAN) injection 4 mg  4 mg Intravenous Q6H PRN Dustin Flock, MD   4 mg at 01/15/18 1058  . polyethylene glycol (MIRALAX / GLYCOLAX) packet 17 g  17 g Oral Daily PRN  [name], MD      . senna-docusate (Senokot-S) tablet 2 tablet  2 tablet Oral BID [name], MD   2 tablet at 01/16/18 8841     Discharge Medications: Please see discharge summary for a list of discharge medications.  Relevant Imaging Results:  Relevant Lab Results:   Additional Information [phone]  Zettie Pho, LCSW

## 2018-01-20 DIAGNOSIS — R531 Weakness: Secondary | ICD-10-CM

## 2018-01-20 DIAGNOSIS — Z66 Do not resuscitate: Secondary | ICD-10-CM

## 2018-01-20 DIAGNOSIS — N189 Chronic kidney disease, unspecified: Secondary | ICD-10-CM

## 2018-01-20 DIAGNOSIS — Z515 Encounter for palliative care: Secondary | ICD-10-CM

## 2018-01-20 DIAGNOSIS — R748 Abnormal levels of other serum enzymes: Secondary | ICD-10-CM

## 2018-01-20 LAB — COMPREHENSIVE METABOLIC PANEL
ALBUMIN: 1.7 g/dL — AB (ref 3.5–5.0)
ALK PHOS: 272 U/L — AB (ref 38–126)
ALT: 250 U/L — ABNORMAL HIGH (ref 0–44)
ANION GAP: 8 (ref 5–15)
AST: 127 U/L — ABNORMAL HIGH (ref 15–41)
BUN: 73 mg/dL — ABNORMAL HIGH (ref 8–23)
CALCIUM: 7.6 mg/dL — AB (ref 8.9–10.3)
CO2: 19 mmol/L — ABNORMAL LOW (ref 22–32)
Chloride: 113 mmol/L — ABNORMAL HIGH (ref 98–111)
Creatinine, Ser: 4.94 mg/dL — ABNORMAL HIGH (ref 0.61–1.24)
GFR calc non Af Amer: 10 mL/min — ABNORMAL LOW (ref >60)
GFR, EST AFRICAN AMERICAN: 12 mL/min — AB (ref >60)
GLUCOSE: 126 mg/dL — AB (ref 70–99)
POTASSIUM: 4.5 mmol/L (ref 3.5–5.1)
Sodium: 140 mmol/L (ref 135–145)
TOTAL PROTEIN: 4.8 g/dL — AB (ref 6.5–8.1)
Total Bilirubin: 0.5 mg/dL (ref 0.3–1.2)

## 2018-01-20 LAB — CBC
HEMATOCRIT: 22.6 % — AB (ref 40.0–52.0)
HEMOGLOBIN: 7.7 g/dL — AB (ref 13.0–18.0)
MCH: 32.1 pg (ref 26.0–34.0)
MCHC: 34.3 g/dL (ref 32.0–36.0)
MCV: 93.9 fL (ref 80.0–100.0)
Platelets: 187 10*3/uL (ref 150–440)
RBC: 2.4 MIL/uL — ABNORMAL LOW (ref 4.40–5.90)
RDW: 15.8 % — AB (ref 11.5–14.5)
WBC: 8.3 10*3/uL (ref 3.8–10.6)

## 2018-01-20 LAB — APTT
APTT: 61 s — AB (ref 24–36)
aPTT: 68 seconds — ABNORMAL HIGH (ref 24–36)

## 2018-01-20 LAB — GLUCOSE, CAPILLARY
Glucose-Capillary: 102 mg/dL — ABNORMAL HIGH (ref 70–99)
Glucose-Capillary: 157 mg/dL — ABNORMAL HIGH (ref 70–99)
Glucose-Capillary: 195 mg/dL — ABNORMAL HIGH (ref 70–99)
Glucose-Capillary: 235 mg/dL — ABNORMAL HIGH (ref 70–99)

## 2018-01-20 LAB — HEPARIN LEVEL (UNFRACTIONATED)
HEPARIN UNFRACTIONATED: 0.77 [IU]/mL — AB (ref 0.30–0.70)
HEPARIN UNFRACTIONATED: 0.72 [IU]/mL — AB (ref 0.30–0.70)

## 2018-01-20 MED ORDER — LEVOTHYROXINE SODIUM 50 MCG PO TABS
50.0000 ug | ORAL_TABLET | Freq: Every day | ORAL | Status: DC
  Administered 2018-01-21 – 2018-01-29 (×8): 50 ug via ORAL
  Filled 2018-01-20 (×8): qty 1

## 2018-01-20 MED ORDER — SODIUM CHLORIDE 0.9 % IV SOLN
INTRAVENOUS | Status: DC
  Administered 2018-01-20 – 2018-01-22 (×3): via INTRAVENOUS

## 2018-01-20 NOTE — Consult Note (Signed)
Consultation Note Date: 01/20/2018   Patient Name: Gregory Tyler  DOB: Dec 26, 1940  MRN: [id number]  Age / Sex: 77 y.o., male  PCP: [name], MD Referring Physician: Gorden Harms, MD  Reason for Consultation: Establishing goals of care and Psychosocial/spiritual support  HPI/Patient Profile: 77 y.o. male   admitted on 01/13/2018 with a known history of chronic systolic CHF, diabetes type 2, paroxysmal atrial fibrillation, coronary artery disease, gastric ulcer, hypertension, chronic kidney disease, hyperlipidemia who currently resides in a skilled nursing facility brought from there for altered mental status.  Admitted with increased weakness and confusion.  Found to have urinary tract infection and significantly elevated LFTs.  Elevated creatinine 4.2, now considering dialysis  Patient lives at skilled nursing facility where his wife resides in the memory care unit.  Patient faces treatment option decisions, advanced directive decisions and anticipatory care needs.   Clinical Assessment and Goals of Care:  This NP Wadie Lessen reviewed medical records, received report from team, assessed the patient and then meet at the patient's bedside along with his lifetime friend/Shirley and then by phone with son/ Lupita Dawn to discuss diagnosis, prognosis, [place], EOL wishes disposition and options.  Concept of  Palliative Care were discussed  A detailed discussion was had today regarding advanced directives.  Concepts specific to code status, artifical feeding and hydration, continued IV antibiotics, dialysis and rehospitalization was had.  The difference between a aggressive medical intervention path  and a palliative comfort care path for this patient at this time was had.  Values and goals of care important to patient and family were attempted to be elicited.  Discussed the importance of  consideration of quality of life, human mortality and limitations of medical intervetnions when making life prolonging measure decisions.   Questions and concerns addressed.   Family encouraged to call with questions or concerns.    PMT will continue to support holistically.   HCPOA/son    SUMMARY OF RECOMMENDATIONS    Patient is open to all offered and available medical interventions to prolong life.  Code Status/Advance Care Planning:  DNR--documented today   Palliative Prophylaxis:   Delirium Protocol, Frequent Pain Assessment and Oral Care  Additional Recommendations (Limitations, Scope, Preferences):  Avoid Hospitalization and Full Scope Treatment  Psycho-social/Spiritual:   Desire for further Chaplaincy support:yes  Additional Recommendations: Education on Hospice  Prognosis:   Unable to determine  Discharge Planning: [facility] for rehab with Palliative care service follow-up      Primary Diagnoses: Present on Admission: . Sepsis ([place])   I have reviewed the medical record, interviewed the patient and family, and examined the patient. The following aspects are pertinent.  Past Medical History:  Diagnosis Date  . Anemia   . Anxiety   . Arthritis   . Cardiomyopathy ([place])    a. 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  Marland Kitchen Chronic combined systolic (congestive) and diastolic (congestive) heart failure ([place])    a. 05/2015 TEE: EF 35-40% (in  setting of Afib).  . Chronic Pelvic pain   . Coronary artery disease    a. 05/2015 CTA Chest: 3 vessel coronary Ca2+.  . Depression   . Diabetes mellitus without complication ([place])   . Emphysema lung ([place])   . Gastric ulcer   . Gout   . Hyperlipidemia   . Hypertension   . Nephrolithiasis   . PAF (paroxysmal atrial fibrillation) ([place])    a. 05/2015 s/p DCCV; b. 03/2016 s/p DCCV; c. CHA2DS2VASc = 6 (coumadin).  . Prostate cancer [facility])    Prostate  . Stage III chronic kidney disease  ([place])   . Weight loss    50 lb since Apr 24, 2015   Social History   Socioeconomic History  . Marital status: Married    Spouse name: Not on file  . Number of children: Not on file  . Years of education: Not on file  . Highest education level: Not on file  Occupational History  . Occupation: retired  Scientific laboratory technician  . Financial resource strain: Not on file  . Food insecurity:    Worry: Patient refused    Inability: Patient refused  . Transportation needs:    Medical: Patient refused    Non-medical: Patient refused  Tobacco Use  . Smoking status: Former Smoker    Last attempt to quit: 12/19/1940    Years since quitting: 77.1  . Smokeless tobacco: Former Systems developer    Types: Brown date: 11/19/2014  Substance and Sexual Activity  . Alcohol use: No  . Drug use: No  . Sexual activity: Not Currently  Lifestyle  . Physical activity:    Days per week: 0 days    Minutes per session: 0 min  . Stress: Patient refused  Relationships  . Social connections:    Talks on phone: More than three times a week    Gets together: Twice a week    Attends religious service: Not on file    Active member of club or organization: No    Attends meetings of clubs or organizations: Never    Relationship status: Married  Other Topics Concern  . Not on file  Social History Narrative   Retired.  Lives iwith son   Family History  Problem Relation Age of Onset  . CAD Mother   . Stroke Mother   . COPD Mother   . Heart failure Mother   . Diabetes Other    Scheduled Meds: . allopurinol  100 mg Oral Daily  . insulin aspart  0-9 Units Subcutaneous TID WC  . levothyroxine  25 mcg Intravenous Daily  . midodrine  10 mg Oral TID WC  . OLANZapine  2.5 mg Oral QHS  . senna-docusate  2 tablet Oral BID   Continuous Infusions: . sodium chloride    . ceFEPime (MAXIPIME) IV Stopped (01/20/18 0701)  . heparin 750 Units/hr (01/20/18 1035)   PRN Meds:.[DISCONTINUED] ondansetron **OR** ondansetron  (ZOFRAN) IV, polyethylene glycol Medications Prior to Admission:  Prior to Admission medications   Medication Sig Start Date End Date Taking? Authorizing Provider  acetaminophen (TYLENOL) 325 MG tablet Take 2 tablets (650 mg total) by mouth every 6 (six) hours as needed for mild pain (or Fever >/= 101). 09/15/17  Yes Wieting, Richard, MD  allopurinol (ZYLOPRIM) 300 MG tablet Take 300 mg by mouth daily.   Yes [provider]  Elastic Bandages & Supports (MEDICAL COMPRESSION THIGH HIGH) MISC 1 application by Does not apply route daily. 09/11/17  Yes Merlyn Lot, MD  ELIQUIS 2.5 MG TABS tablet Take 2.5 mg by mouth every 12 (twelve) hours. 12/12/17  Yes [provider]  feeding supplement, ENSURE ENLIVE, (ENSURE ENLIVE) LIQD Take 237 mLs by mouth 2 (two) times daily between meals. 09/15/17  Yes Wieting, Richard, MD  furosemide (LASIX) 20 MG tablet Take 40mg  in the morning and 20 mg in the afternoon 10/03/17  Yes Gollan, Kathlene November, MD  gabapentin (NEURONTIN) 100 MG capsule Take 1 capsule (100 mg total) by mouth at bedtime. 09/15/17  Yes Wieting, Richard, MD  HYDROcodone-acetaminophen (NORCO/VICODIN) 5-325 MG tablet Take 1 tablet by mouth every 4 (four) hours as needed for moderate pain.   Yes [provider]  lisinopril (PRINIVIL,ZESTRIL) 5 MG tablet Take 1 tablet (5 mg total) by mouth daily. 10/03/17  Yes Gollan, Kathlene November, MD  magnesium oxide (MAG-OX) 400 (241.3 Mg) MG tablet Take 400 mg by mouth daily.   Yes [provider]  magnesium oxide (MAG-OX) 400 MG tablet Take 400 mg by mouth daily.   Yes [provider]  metFORMIN (GLUCOPHAGE) 500 MG tablet Take 500 mg by mouth 2 (two) times daily with a meal.    Yes [provider]  metoprolol tartrate (LOPRESSOR) 50 MG tablet Take 50 mg by mouth 2 (two) times daily.   Yes [provider]  rosuvastatin (CRESTOR) 20 MG tablet Take 20 mg by mouth daily.   Yes [provider]   Allergies    Allergen Reactions  . Aspirin     GI Bleeding  . Codeine Other (See Comments)    BLISTERS Other reaction(s): Other (See Comments) BLISTERS   . Codeine Sulfate Other (See Comments)    Other reaction(s): Other (See Comments) BLISTERS BLISTERS   Review of Systems  Constitutional: Positive for fatigue.  Neurological: Positive for weakness.    Physical Exam  Constitutional: He is oriented to person, place, and time. He appears ill.  Cardiovascular: Normal rate, regular rhythm and normal heart sounds.  Pulmonary/Chest: He has decreased breath sounds.  Neurological: He is alert and oriented to person, place, and time.  Skin: Skin is warm and dry.    Tyler Signs: BP (!) 111/57 (BP Location: Right Arm)   Pulse (!) 58   Temp 98.2 F (36.8 C) (Oral)   Resp 12   Ht 5\' 8"  (1.727 m)   Wt 67.5 kg   SpO2 97%   BMI 22.63 kg/m  Pain Scale: 0-10   Pain Score: 0-No pain   SpO2: SpO2: 97 % O2 Device:SpO2: 97 % O2 Flow Rate: .   IO: Intake/output summary:   Intake/Output Summary (Last 24 hours) at 01/20/2018 1209 Last data filed at 01/20/2018 0900 Gross per 24 hour  Intake 2168.77 ml  Output 100 ml  Net 2068.77 ml    LBM: Last BM Date: 01/19/18 Baseline Weight: Weight: 67.5 kg Most recent weight: Weight: 67.5 kg     Palliative Assessment/Data: 50%   Discussed with Dr   Jerelyn Charles  Time In: 1200 Time Out: 1315 Time Total: 75 minutes Greater than 50%  of this time was spent counseling and coordinating care related to the above assessment and plan.  Signed by: Wadie Lessen, NP   Please contact Palliative Medicine Team phone at [phone] for questions and concerns.  For individual provider: See Shea Evans

## 2018-01-20 NOTE — Progress Notes (Signed)
PT Cancellation Note  Patient Details Name: Gregory Tyler MRN: [id number] DOB: [birth year]   Cancelled Treatment:    Reason Eval/Treat Not Completed: Patient declined, no reason specified. Treatment attempted; pt refused noting pain and fatigue. Pt reports frustration as well,as pt is wanting to go to [facility]. Re attempt another date.    Larae Grooms, PTA 01/20/2018, 5:08 PM

## 2018-01-20 NOTE — Progress Notes (Signed)
ANTICOAGULATION CONSULT NOTE   Pharmacy Consult for Heparin Drip Management  Indication: atrial fibrillation  Allergies  Allergen Reactions  . Aspirin     GI Bleeding  . Codeine Other (See Comments)    BLISTERS Other reaction(s): Other (See Comments) BLISTERS   . Codeine Sulfate Other (See Comments)    Other reaction(s): Other (See Comments) BLISTERS BLISTERS    Patient Measurements: Height: 5\' 8"  (172.7 cm) Weight: 148 lb 13 oz (67.5 kg) IBW/kg (Calculated) : 68.4  Vital Signs: Temp: 98.2 F (36.8 C) (09/30 0429) Temp Source: Oral (09/30 0429) BP: 111/57 (09/30 0429) Pulse Rate: 58 (09/30 0429)  Labs: Recent Labs    01/18/18 0818  01/18/18 0820 01/18/18 1515  01/19/18 0555 01/19/18 1103 01/19/18 1856 01/20/18 0441  HGB 9.4*  --   --   --   --  7.8*  --   --  7.7*  HCT 27.2*  --   --   --   --  22.4*  --   --  22.6*  PLT 205  --   --   --   --  176  --   --  187  APTT  --    < > 102* 111*   < >  --  73* 68* 68*  LABPROT  --   --   --  17.6*  --   --   --   --   --   INR  --   --   --  1.46  --   --   --   --   --   HEPARINUNFRC  --   --   --   --   --  1.05*  --  0.76* 0.77*  CREATININE  --   --  5.43*  --   --  5.34*  --   --  4.94*   < > = values in this interval not displayed.    Estimated Creatinine Clearance: 11.8 mL/min (A) (by C-G formula based on SCr of 4.94 mg/dL (H)).   Medical History: Past Medical History:  Diagnosis Date  . Anemia   . Anxiety   . Arthritis   . Cardiomyopathy ([place])    a. 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  Marland Kitchen Chronic combined systolic (congestive) and diastolic (congestive) heart failure ([place])    a. 05/2015 TEE: EF 35-40% (in setting of Afib).  . Chronic Pelvic pain   . Coronary artery disease    a. 05/2015 CTA Chest: 3 vessel coronary Ca2+.  . Depression   . Diabetes mellitus without complication ([place])   . Emphysema lung ([place])   . Gastric ulcer   . Gout   . Hyperlipidemia   . Hypertension    . Nephrolithiasis   . PAF (paroxysmal atrial fibrillation) ([place])    a. 05/2015 s/p DCCV; b. 03/2016 s/p DCCV; c. CHA2DS2VASc = 6 (coumadin).  . Prostate cancer [facility])    Prostate  . Stage III chronic kidney disease ([place])   . Weight loss    50 lb since Apr 24, 2015    Medications:  Scheduled:  . allopurinol  100 mg Oral Daily  . insulin aspart  0-9 Units Subcutaneous TID WC  . levothyroxine  25 mcg Intravenous Daily  . midodrine  10 mg Oral TID WC  . OLANZapine  2.5 mg Oral QHS  . senna-docusate  2 tablet Oral BID   Infusions:  . ceFEPime (MAXIPIME) IV Stopped (01/20/18  0701)  . heparin 750 Units/hr (01/19/18 1900)    Assessment:  Pharmacy consulted for heparin drip management for 77 yo male previously ordered home dose of apixaban 2.5mg  PO BID. Patient with acute renal failure. Last dose of apixaban at 1200 on 9/27. Home dose of apixaban 2.5mg  BID due to bleeding issues as an outpatient per son.   Heparin drip was started at  950 units/hr starting at 0000 9/28  09/28 1515 aPTT 111s:  09/29 0103 aPTT 101s, HL 1.05 IU/mL: rate decreased to 750 units/hr 09/29 1103 aPTT 73s 09/29 1856 aPTT 68s, HL 0.76 IU/mL 09/30 0441 aPTT 68s, HL 0.77 IU/mL 09/30 1310 aPTT 61s, HL 0.72 IU/mL  The aPTT and HL are not correlating, therefore I will consider the aPTT for dosing purposes to be subtherapeutic  Goal of Therapy:  Heparin level 0.3-0.7 units/ml  APTT 66-102s  Monitor platelets by anticoagulation protocol: Yes   Plan:  Will increase the heparin infusion rate to 850 units/hr and obtain the next aPTT at midnight 10/01. Will obtain anti-Xa levels daily until aPTT and anti-Xa levels correlate.   Pharmacy will continue to monitor and adjust per consult.   Dallie Piles, PharmD 01/20/2018,8:06 AM

## 2018-01-20 NOTE — Care Management Important Message (Signed)
Copy of signed IM left with patient in room.  

## 2018-01-20 NOTE — Progress Notes (Signed)
[facility], [place] 01/20/18  Subjective:  Renal function slightly improved. Creatinine still high however at 4.94 with an EGFR of 10. Patient states that he is making urine and only 275 cc was documented.   Objective:  Vital signs in last 24 hours:  Temp:  [98 F (36.7 C)-98.6 F (37 C)] 98.2 F (36.8 C) (09/30 0429) Pulse Rate:  [57-62] 58 (09/30 0429) Resp:  [12-16] 12 (09/30 0429) BP: (100-122)/(57-94) 111/57 (09/30 0429) SpO2:  [97 %-100 %] 97 % (09/30 0429)  Weight change:  Filed Weights   01/13/18 1327  Weight: 67.5 kg    Intake/Output:    Intake/Output Summary (Last 24 hours) at 01/20/2018 1132 Last data filed at 01/20/2018 0900 Gross per 24 hour  Intake 2168.77 ml  Output 100 ml  Net 2068.77 ml     Physical Exam: General:  Chronically ill-appearing, laying in the bed, thin cachectic  HEENT  dry oral mucous membranes  Neck  supple  Pulm/lungs  decreased breath sounds at bases, room air   CVS/Heart  irregular  Abdomen:   Soft, nondistended, BS present  Extremities:  + dependent peripheral edema, bilateral feet in bandages  Neurologic:  Alert, able to answer few questions    Basic Metabolic Panel:  Recent Labs  Lab 01/14/18 0349  01/15/18 0520 01/16/18 0513 01/17/18 0558 01/18/18 0818 01/18/18 0820 01/19/18 0555 01/20/18 0441  NA 136  --  137 140 141  --  144 140 140  K 5.8*   < > 4.9 4.8 4.5  --  4.6 4.3 4.5  CL 105  --  107 108 113*  --  113* 113* 113*  CO2 19*  --  21* 21* 20*  --  21* 20* 19*  GLUCOSE 165*  --  134* 150* 142*  --  107* 110* 126*  BUN 85*  --  87* 84* 80*  --  79* 74* 73*  CREATININE 3.75*  --  4.23* 4.78* 4.77*  --  5.43* 5.34* 4.94*  CALCIUM 7.5*  --  7.1* 7.0* 6.9*  --  7.9* 7.5* 7.6*  MG 1.9  --  1.9 1.9  --  2.0  --   --   --   PHOS 6.5*  --  6.2*  --   --  5.9*  --   --   --    < > = values in this interval not displayed.     CBC: Recent Labs  Lab 01/13/18 1339 01/14/18 0349  01/15/18 0520 01/16/18 0513 01/18/18 0818 01/19/18 0555 01/20/18 0441  WBC 15.6* 18.6* 12.9* 7.9 7.6 7.5 8.3  NEUTROABS 13.7* 16.5* 10.3* 5.7  --   --   --   HGB 10.3* 10.3* 9.5* 9.0* 9.4* 7.8* 7.7*  HCT 30.8* 30.4* 27.4* 25.7* 27.2* 22.4* 22.6*  MCV 94.2 93.2 92.7 92.8 93.7 94.1 93.9  PLT 389 368 294 241 205 176 187      Lab Results  Component Value Date   HEPBSAG Negative 01/18/2018   HEPBIGM Negative 01/18/2018      Microbiology:  Recent Results (from the past 240 hour(s))  Blood culture (routine x 2)     Status: None   Collection Time: 01/13/18  1:39 PM  Result Value Ref Range Status   Specimen Description RIGHT ANTECUBITAL  Final   Special Requests   Final    BOTTLES DRAWN AEROBIC AND ANAEROBIC Blood Culture adequate volume   Culture   Final    NO GROWTH 5  DAYS Performed at [facility], [address] [place], [place] [address]    Report Status 01/18/2018 FINAL  Final  Blood culture (routine x 2)     Status: None   Collection Time: 01/13/18  1:39 PM  Result Value Ref Range Status   Specimen Description LEFT ANTECUBITAL  Final   Special Requests   Final    BOTTLES DRAWN AEROBIC AND ANAEROBIC Blood Culture results may not be optimal due to an excessive volume of blood received in culture bottles   Culture   Final    NO GROWTH 5 DAYS Performed at [facility], [address], [place], [place] [address]    Report Status 01/18/2018 FINAL  Final  Urine Culture     Status: Abnormal   Collection Time: 01/13/18  1:39 PM  Result Value Ref Range Status   Specimen Description   Final    URINE, RANDOM Performed at [facility], [address], [place], [place] [address]    Special Requests   Final    NONE Performed at [facility], [address] [place], [place] [address]    Culture >=100,000 COLONIES/mL PSEUDOMONAS AERUGINOSA (A)  Final   Report Status 01/16/2018 FINAL  Final   Organism ID, Bacteria PSEUDOMONAS  AERUGINOSA (A)  Final      Susceptibility   Pseudomonas aeruginosa - MIC*    CEFTAZIDIME 4 SENSITIVE Sensitive     CIPROFLOXACIN <=0.25 SENSITIVE Sensitive     GENTAMICIN <=1 SENSITIVE Sensitive     IMIPENEM 1 SENSITIVE Sensitive     PIP/TAZO <=4 SENSITIVE Sensitive     CEFEPIME 2 SENSITIVE Sensitive     * >=100,000 COLONIES/mL PSEUDOMONAS AERUGINOSA  MRSA PCR Screening     Status: None   Collection Time: 01/13/18  5:13 PM  Result Value Ref Range Status   MRSA by PCR NEGATIVE NEGATIVE Final    Comment:        The GeneXpert MRSA Assay (FDA approved for NASAL specimens only), is one component of a comprehensive MRSA colonization surveillance program. It is not intended to diagnose MRSA infection nor to guide or monitor treatment for MRSA infections. Performed at [facility], [address] [place], [place] [address]     Coagulation Studies: Recent Labs    01/18/18 1515  LABPROT 17.6*  INR 1.46    Urinalysis: No results for input(s): COLORURINE, LABSPEC, PHURINE, GLUCOSEU, HGBUR, BILIRUBINUR, KETONESUR, PROTEINUR, UROBILINOGEN, NITRITE, LEUKOCYTESUR in the last 72 hours.  Invalid input(s): APPERANCEUR    Imaging: No results found.   Medications:   . ceFEPime (MAXIPIME) IV Stopped (01/20/18 0701)  . heparin 750 Units/hr (01/20/18 1035)   . allopurinol  100 mg Oral Daily  . insulin aspart  0-9 Units Subcutaneous TID WC  . levothyroxine  25 mcg Intravenous Daily  . midodrine  10 mg Oral TID WC  . OLANZapine  2.5 mg Oral QHS  . senna-docusate  2 tablet Oral BID   [DISCONTINUED] ondansetron **OR** ondansetron (ZOFRAN) IV, polyethylene glycol  Assessment/ Plan:  77 y.o. Caucasian male with medical problems of chronic systolic congestive heart failure, diabetes type 2, atrial fibrillation, hypertension, history of gastric ulcer, coronary disease, who was admitted to [facility] on 01/13/2018 for evaluation of altered mental status.   1.  Acute renal failure on  chronic kidney disease stage III Baseline creatinine 1.30/GFR 51 from December 20, 2017 Admission creatinine 3.86, still critically high at 4.9 The cause of acute renal failure appears to be  multifactorial including hypotension, sepsis, likely leading to ATN. Renal u/s shows left non obstructing stone 5 mm; no hydronephrosis  -  Renal function remains quite diminished.  Creatinine currently 4.94 with an EGFR of 10.  Urine output was documented is only 275 cc however patient states that he was making more urine.  We will start the patient on gentle IV fluid hydration with 0.9 normal saline at 50 cc per hour.  No urgent indication for dialysis at the moment.  2.  UTI and sepsis, Pseudomonas aeuroginosa Treated with cefepime.   3. Hyperkalemia - Potassium down to 4.5 and acceptable.     LOS: 7 Gregory Tyler 9/30/201911:32 AM  [place], [place]  Note: This note was prepared with Dragon dictation. Any transcription errors are unintentional

## 2018-01-20 NOTE — Progress Notes (Signed)
ANTICOAGULATION CONSULT NOTE   Pharmacy Consult for Heparin Drip Management  Indication: atrial fibrillation  Allergies  Allergen Reactions  . Aspirin     GI Bleeding  . Codeine Other (See Comments)    BLISTERS Other reaction(s): Other (See Comments) BLISTERS   . Codeine Sulfate Other (See Comments)    Other reaction(s): Other (See Comments) BLISTERS BLISTERS    Patient Measurements: Height: 5\' 8"  (172.7 cm) Weight: 148 lb 13 oz (67.5 kg) IBW/kg (Calculated) : 68.4  Vital Signs: Temp: 98.2 F (36.8 C) (09/30 0429) Temp Source: Oral (09/30 0429) BP: 111/57 (09/30 0429) Pulse Rate: 58 (09/30 0429)  Labs: Recent Labs    01/18/18 0818  01/18/18 0820 01/18/18 1515  01/19/18 0555 01/19/18 1103 01/19/18 1856 01/20/18 0441  HGB 9.4*  --   --   --   --  7.8*  --   --  7.7*  HCT 27.2*  --   --   --   --  22.4*  --   --  22.6*  PLT 205  --   --   --   --  176  --   --  187  APTT  --    < > 102* 111*   < >  --  73* 68* 68*  LABPROT  --   --   --  17.6*  --   --   --   --   --   INR  --   --   --  1.46  --   --   --   --   --   HEPARINUNFRC  --   --   --   --   --  1.05*  --  0.76* 0.77*  CREATININE  --   --  5.43*  --   --  5.34*  --   --  4.94*   < > = values in this interval not displayed.    Estimated Creatinine Clearance: 11.8 mL/min (A) (by C-G formula based on SCr of 4.94 mg/dL (H)).   Medical History: Past Medical History:  Diagnosis Date  . Anemia   . Anxiety   . Arthritis   . Cardiomyopathy ([place])    a. 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  Marland Kitchen Chronic combined systolic (congestive) and diastolic (congestive) heart failure ([place])    a. 05/2015 TEE: EF 35-40% (in setting of Afib).  . Chronic Pelvic pain   . Coronary artery disease    a. 05/2015 CTA Chest: 3 vessel coronary Ca2+.  . Depression   . Diabetes mellitus without complication ([place])   . Emphysema lung ([place])   . Gastric ulcer   . Gout   . Hyperlipidemia   . Hypertension    . Nephrolithiasis   . PAF (paroxysmal atrial fibrillation) ([place])    a. 05/2015 s/p DCCV; b. 03/2016 s/p DCCV; c. CHA2DS2VASc = 6 (coumadin).  . Prostate cancer [facility])    Prostate  . Stage III chronic kidney disease ([place])   . Weight loss    50 lb since Apr 24, 2015    Medications:  Scheduled:  . allopurinol  100 mg Oral Daily  . insulin aspart  0-9 Units Subcutaneous TID WC  . levothyroxine  25 mcg Intravenous Daily  . midodrine  10 mg Oral TID WC  . OLANZapine  2.5 mg Oral QHS  . senna-docusate  2 tablet Oral BID   Infusions:  . ceFEPime (MAXIPIME) IV 1 g (  01/19/18 2144)  . heparin 750 Units/hr (01/19/18 1900)    Assessment: Pharmacy consulted for heparin drip management for 77 yo male previously ordered home dose of apixaban 2.5mg  PO BID. Patient with acute renal failure. Last dose of apixaban at 1200 on 9/27.   Home dose of apixaban 2.5mg  BID due to bleeding issues as an outpatient per son.   09/29 @ 0100 aPTT 101 seconds therapeutic, but borderline.Decreases rate to 750 units/hr.   Goal of Therapy:  Heparin level 0.3-0.7 units/ml  APTT 66-102s  Monitor platelets by anticoagulation protocol: Yes   Plan:  09/30 @ 0500 aPTT 68 therapeutic, HL 0.77 supratherapeutic. Will continue current rate and will recheck next aPTT/HL @ 1300 since HL has stayed elevated. Hgb has trended down to 7.8 but stable at 7.7 will continue to monitor.  Tobie Lords, Pharm.D Clinical Pharmacist 01/20/2018,6:33 AM

## 2018-01-20 NOTE — Progress Notes (Signed)
Gregory Antigua, MD [address], [place], [place], [place], [address] [address], [place], [place], [place], [address] Phone: [phone]  Fax: [phone]   Subjective: Patient eating breakfast this morning without abdominal pain, nausea or vomiting.  No complaints.   Objective: Exam: Vital signs in last 24 hours: Vitals:   01/19/18 0449 01/19/18 1138 01/19/18 1957 01/20/18 0429  BP: 126/61 (!) 100/59 (!) 122/94 (!) 111/57  Pulse: 64 62 (!) 57 (!) 58  Resp: 16 16 16 12   Temp: 98.3 F (36.8 C) 98 F (36.7 C) 98.6 F (37 C) 98.2 F (36.8 C)  TempSrc: Oral Oral Oral Oral  SpO2: 100% 100% 99% 97%  Weight:      Height:       Weight change:   Intake/Output Summary (Last 24 hours) at 01/20/2018 1100 Last data filed at 01/20/2018 0900 Gross per 24 hour  Intake 2168.77 ml  Output 100 ml  Net 2068.77 ml    General: No acute distress, AAO x3 Abd: Soft, NT/ND, No HSM Skin: Warm, no rashes Neck: Supple, Trachea midline   Lab Results: Lab Results  Component Value Date   WBC 8.3 01/20/2018   HGB 7.7 (L) 01/20/2018   HCT 22.6 (L) 01/20/2018   MCV 93.9 01/20/2018   PLT 187 01/20/2018   Micro Results: Recent Results (from the past 240 hour(s))  Blood culture (routine x 2)     Status: None   Collection Time: 01/13/18  1:39 PM  Result Value Ref Range Status   Specimen Description RIGHT ANTECUBITAL  Final   Special Requests   Final    BOTTLES DRAWN AEROBIC AND ANAEROBIC Blood Culture adequate volume   Culture   Final    NO GROWTH 5 DAYS Performed at [facility], [address] [place], [place] [address]    Report Status 01/18/2018 FINAL  Final  Blood culture (routine x 2)     Status: None   Collection Time: 01/13/18  1:39 PM  Result Value Ref Range Status   Specimen Description LEFT ANTECUBITAL  Final   Special Requests   Final    BOTTLES DRAWN AEROBIC AND ANAEROBIC Blood Culture results may not be optimal due to an excessive volume of blood  received in culture bottles   Culture   Final    NO GROWTH 5 DAYS Performed at [facility], [address] [place], [place] [address]    Report Status 01/18/2018 FINAL  Final  Urine Culture     Status: Abnormal   Collection Time: 01/13/18  1:39 PM  Result Value Ref Range Status   Specimen Description   Final    URINE, RANDOM Performed at [facility], [address], [place], [place] [address]    Special Requests   Final    NONE Performed at [facility], [address] [place], [place] [address]    Culture >=100,000 COLONIES/mL PSEUDOMONAS AERUGINOSA (A)  Final   Report Status 01/16/2018 FINAL  Final   Organism ID, Bacteria PSEUDOMONAS AERUGINOSA (A)  Final      Susceptibility   Pseudomonas aeruginosa - MIC*    CEFTAZIDIME 4 SENSITIVE Sensitive     CIPROFLOXACIN <=0.25 SENSITIVE Sensitive     GENTAMICIN <=1 SENSITIVE Sensitive     IMIPENEM 1 SENSITIVE Sensitive     PIP/TAZO <=4 SENSITIVE Sensitive     CEFEPIME 2 SENSITIVE Sensitive     * >=100,000 COLONIES/mL PSEUDOMONAS AERUGINOSA  MRSA PCR Screening  Status: None   Collection Time: 01/13/18  5:13 PM  Result Value Ref Range Status   MRSA by PCR NEGATIVE NEGATIVE Final    Comment:        The GeneXpert MRSA Assay (FDA approved for NASAL specimens only), is one component of a comprehensive MRSA colonization surveillance program. It is not intended to diagnose MRSA infection nor to guide or monitor treatment for MRSA infections. Performed at [facility], [address] [place], [place] [address]    Studies/Results: No results found. Medications:  Scheduled Meds: . allopurinol  100 mg Oral Daily  . insulin aspart  0-9 Units Subcutaneous TID WC  . levothyroxine  25 mcg Intravenous Daily  . midodrine  10 mg Oral TID WC  . OLANZapine  2.5 mg Oral QHS  . senna-docusate  2 tablet Oral BID   Continuous Infusions: . ceFEPime (MAXIPIME) IV Stopped (01/20/18 0701)  .  heparin 750 Units/hr (01/20/18 1035)   PRN Meds:.[DISCONTINUED] ondansetron **OR** ondansetron (ZOFRAN) IV, polyethylene glycol   Assessment: Active Problems:   Sepsis ([place])    Plan: Liver enzymes improved today compared to yesterday See Dr. Verlin Grills detailed notes Elevated liver enzymes likely due to shock liver and now improving as expected Acute hepatitis panel negative Continue daily CMP Continue to avoid hepatotoxic drugs Follow-up with primary care provider closely and repeat liver enzymes within 1 week of discharge Follow-up in GI clinic on discharge as well.  Primary team to please set up appointment before discharge.   LOS: 7 days   Gregory Antigua, MD 01/20/2018, 11:00 AM

## 2018-01-21 DIAGNOSIS — Z66 Do not resuscitate: Secondary | ICD-10-CM

## 2018-01-21 DIAGNOSIS — Z515 Encounter for palliative care: Secondary | ICD-10-CM

## 2018-01-21 DIAGNOSIS — N179 Acute kidney failure, unspecified: Secondary | ICD-10-CM

## 2018-01-21 DIAGNOSIS — E43 Unspecified severe protein-calorie malnutrition: Secondary | ICD-10-CM

## 2018-01-21 DIAGNOSIS — N189 Chronic kidney disease, unspecified: Secondary | ICD-10-CM

## 2018-01-21 LAB — BASIC METABOLIC PANEL
Anion gap: 9 (ref 5–15)
BUN: 68 mg/dL — AB (ref 8–23)
CALCIUM: 7.8 mg/dL — AB (ref 8.9–10.3)
CO2: 18 mmol/L — ABNORMAL LOW (ref 22–32)
CREATININE: 4.56 mg/dL — AB (ref 0.61–1.24)
Chloride: 113 mmol/L — ABNORMAL HIGH (ref 98–111)
GFR calc Af Amer: 13 mL/min — ABNORMAL LOW (ref >60)
GFR, EST NON AFRICAN AMERICAN: 11 mL/min — AB (ref >60)
Glucose, Bld: 86 mg/dL (ref 70–99)
Potassium: 4.4 mmol/L (ref 3.5–5.1)
SODIUM: 140 mmol/L (ref 135–145)

## 2018-01-21 LAB — ALT: ALT: 212 U/L — ABNORMAL HIGH (ref 0–44)

## 2018-01-21 LAB — GLUCOSE, CAPILLARY
GLUCOSE-CAPILLARY: 86 mg/dL (ref 70–99)
GLUCOSE-CAPILLARY: 167 mg/dL — AB (ref 70–99)
GLUCOSE-CAPILLARY: 131 mg/dL — AB (ref 70–99)
Glucose-Capillary: 151 mg/dL — ABNORMAL HIGH (ref 70–99)

## 2018-01-21 LAB — APTT: aPTT: 72 seconds — ABNORMAL HIGH (ref 24–36)

## 2018-01-21 LAB — BILIRUBIN, TOTAL: BILIRUBIN TOTAL: 0.5 mg/dL (ref 0.3–1.2)

## 2018-01-21 LAB — ALKALINE PHOSPHATASE: Alkaline Phosphatase: 251 U/L — ABNORMAL HIGH (ref 38–126)

## 2018-01-21 LAB — AST: AST: 115 U/L — ABNORMAL HIGH (ref 15–41)

## 2018-01-21 MED ORDER — SODIUM CHLORIDE 0.9 % IV SOLN
1.0000 g | INTRAVENOUS | Status: AC
  Administered 2018-01-21: 1 g via INTRAVENOUS
  Filled 2018-01-21: qty 1

## 2018-01-21 MED ORDER — TUBERCULIN PPD 5 UNIT/0.1ML ID SOLN
5.0000 [IU] | Freq: Once | INTRADERMAL | Status: DC
  Filled 2018-01-21: qty 0.1

## 2018-01-21 MED ORDER — CEFAZOLIN SODIUM-DEXTROSE 2-4 GM/100ML-% IV SOLN
2.0000 g | INTRAVENOUS | Status: AC
  Administered 2018-01-22: 2 g via INTRAVENOUS
  Filled 2018-01-21: qty 100

## 2018-01-21 MED ORDER — VITAMIN C 500 MG PO TABS
500.0000 mg | ORAL_TABLET | Freq: Two times a day (BID) | ORAL | Status: DC
  Administered 2018-01-21 – 2018-01-30 (×14): 500 mg via ORAL
  Filled 2018-01-21 (×16): qty 1

## 2018-01-21 MED ORDER — SODIUM CHLORIDE 0.9 % IV SOLN: INTRAVENOUS | Status: DC

## 2018-01-21 MED ORDER — OCUVITE-LUTEIN PO CAPS
1.0000 | ORAL_CAPSULE | Freq: Every day | ORAL | Status: DC
  Administered 2018-01-21 – 2018-01-30 (×8): 1 via ORAL
  Filled 2018-01-21 (×12): qty 1

## 2018-01-21 MED ORDER — SODIUM CHLORIDE 0.9 % IV SOLN
1.0000 g | INTRAVENOUS | Status: DC
  Filled 2018-01-21: qty 1

## 2018-01-21 MED ORDER — PREMIER PROTEIN SHAKE
11.0000 [oz_av] | Freq: Two times a day (BID) | ORAL | Status: DC
  Administered 2018-01-21 – 2018-01-24 (×3): 11 [oz_av] via ORAL

## 2018-01-21 NOTE — Progress Notes (Signed)
Pt reported he cannot take a TB test. Pt states he was told "never to take one again because my arm swole up". Dr Holley Raring paged regarding pt's issue as he was the ordering provider.

## 2018-01-21 NOTE — Progress Notes (Signed)
Vonda Antigua, MD [address] [place], [place], [place], [address] [address], [place], [place], [place], [address] Phone: [phone]  Fax: [phone]   Subjective: The patient denies abdominal or flank pain, anorexia, nausea or vomiting, dysphagia, change in bowel habits or black or bloody stools or weight loss.    Objective: Exam: Vital signs in last 24 hours: Vitals:   01/20/18 1419 01/20/18 2101 01/21/18 0453 01/21/18 1401  BP: 106/74 120/81 (!) 107/54 111/75  Pulse: 66 (!) 56 60 73  Resp: 16 16 16 18   Temp: 97.9 F (36.6 C) 98 F (36.7 C) (!) 97.5 F (36.4 C) 97.6 F (36.4 C)  TempSrc: Oral Oral Oral Oral  SpO2: 100% 99% 95% 98%  Weight:      Height:       Weight change:   Intake/Output Summary (Last 24 hours) at 01/21/2018 1528 Last data filed at 01/21/2018 1300 Gross per 24 hour  Intake 1308.45 ml  Output -  Net 1308.45 ml    General: No acute distress, AAO x3 Abd: Soft, NT/ND, No HSM Skin: Warm, no rashes Neck: Supple, Trachea midline   Lab Results: Lab Results  Component Value Date   WBC 8.3 01/20/2018   HGB 7.7 (L) 01/20/2018   HCT 22.6 (L) 01/20/2018   MCV 93.9 01/20/2018   PLT 187 01/20/2018   Micro Results: Recent Results (from the past 240 hour(s))  Blood culture (routine x 2)     Status: None   Collection Time: 01/13/18  1:39 PM  Result Value Ref Range Status   Specimen Description RIGHT ANTECUBITAL  Final   Special Requests   Final    BOTTLES DRAWN AEROBIC AND ANAEROBIC Blood Culture adequate volume   Culture   Final    NO GROWTH 5 DAYS Performed at [facility], [address] [place], [place] [address]    Report Status 01/18/2018 FINAL  Final  Blood culture (routine x 2)     Status: None   Collection Time: 01/13/18  1:39 PM  Result Value Ref Range Status   Specimen Description LEFT ANTECUBITAL  Final   Special Requests   Final    BOTTLES DRAWN AEROBIC AND ANAEROBIC Blood Culture results may not be  optimal due to an excessive volume of blood received in culture bottles   Culture   Final    NO GROWTH 5 DAYS Performed at [facility], [address] [place], [place] [address]    Report Status 01/18/2018 FINAL  Final  Urine Culture     Status: Abnormal   Collection Time: 01/13/18  1:39 PM  Result Value Ref Range Status   Specimen Description   Final    URINE, RANDOM Performed at [facility], [address], [place], [place] [address]    Special Requests   Final    NONE Performed at [facility], [address] [place], [place] [address]    Culture >=100,000 COLONIES/mL PSEUDOMONAS AERUGINOSA (A)  Final   Report Status 01/16/2018 FINAL  Final   Organism ID, Bacteria PSEUDOMONAS AERUGINOSA (A)  Final      Susceptibility   Pseudomonas aeruginosa - MIC*    CEFTAZIDIME 4 SENSITIVE Sensitive     CIPROFLOXACIN <=0.25 SENSITIVE Sensitive     GENTAMICIN <=1 SENSITIVE Sensitive     IMIPENEM 1 SENSITIVE Sensitive     PIP/TAZO <=4 SENSITIVE Sensitive     CEFEPIME 2 SENSITIVE Sensitive     * >=100,000 COLONIES/mL PSEUDOMONAS AERUGINOSA  MRSA PCR Screening     Status: None   Collection Time: 01/13/18  5:13 PM  Result Value Ref Range Status   MRSA by PCR NEGATIVE NEGATIVE Final    Comment:        The GeneXpert MRSA Assay (FDA approved for NASAL specimens only), is one component of a comprehensive MRSA colonization surveillance program. It is not intended to diagnose MRSA infection nor to guide or monitor treatment for MRSA infections. Performed at [facility], [address], [place], [place] [address]    Studies/Results: No results found. Medications:  Scheduled Meds: . allopurinol  100 mg Oral Daily  . insulin aspart  0-9 Units Subcutaneous TID WC  . levothyroxine  50 mcg Oral QAC breakfast  . midodrine  10 mg Oral TID WC  . multivitamin-lutein  1 capsule Oral Daily  . OLANZapine  2.5 mg Oral QHS  . protein supplement shake  11  oz Oral BID BM  . senna-docusate  2 tablet Oral BID  . tuberculin  5 Units Intradermal Once  . vitamin C  500 mg Oral BID   Continuous Infusions: . sodium chloride 50 mL/hr at 01/21/18 0820  . ceFEPime (MAXIPIME) IV    . heparin 850 Units/hr (01/21/18 0400)   PRN Meds:.[DISCONTINUED] ondansetron **OR** ondansetron (ZOFRAN) IV, polyethylene glycol   Assessment: Active Problems:   Weakness generalized   Sepsis ([place])   Acute renal failure superimposed on chronic kidney disease ([place])   Palliative care by specialist   DNR (do not resuscitate)   Protein-calorie malnutrition, severe    Plan: Liver enzymes continue to improve No GI symptoms present Continue daily CMP Avoid hepatotoxic drugs    LOS: 8 days   Vonda Antigua, MD 01/21/2018, 3:28 PM

## 2018-01-21 NOTE — Progress Notes (Signed)
[facility], [place] 01/21/18  Subjective:  Renal function only marginally better today. Creatinine down to 4.56 with an EGFR of 11. We talked about the potential for renal replacement therapy. Patient willing to move forward with dialysis.   Objective:  Vital signs in last 24 hours:  Temp:  [97.5 F (36.4 C)-98 F (36.7 C)] 97.6 F (36.4 C) (10/01 1401) Pulse Rate:  [56-73] 73 (10/01 1401) Resp:  [16-18] 18 (10/01 1401) BP: (107-120)/(54-81) 111/75 (10/01 1401) SpO2:  [95 %-99 %] 98 % (10/01 1401)  Weight change:  Filed Weights   01/13/18 1327  Weight: 67.5 kg    Intake/Output:    Intake/Output Summary (Last 24 hours) at 01/21/2018 1448 Last data filed at 01/21/2018 1300 Gross per 24 hour  Intake 1515.57 ml  Output -  Net 1515.57 ml     Physical Exam: General:  Chronically ill-appearing, laying in the bed  HEENT  moist oral mucous membranes  Neck  supple  Pulm/lungs  decreased breath sounds at bases, room air   CVS/Heart  irregular  Abdomen:   Soft, nondistended, BS present  Extremities:  + dependent peripheral edema, bilateral feet in bandages  Neurologic:  Alert, follows commands    Basic Metabolic Panel:  Recent Labs  Lab 01/15/18 0520 01/16/18 0513 01/17/18 0558 01/18/18 0818 01/18/18 0820 01/19/18 0555 01/20/18 0441 01/21/18 0834  NA 137 140 141  --  144 140 140 140  K 4.9 4.8 4.5  --  4.6 4.3 4.5 4.4  CL 107 108 113*  --  113* 113* 113* 113*  CO2 21* 21* 20*  --  21* 20* 19* 18*  GLUCOSE 134* 150* 142*  --  107* 110* 126* 86  BUN 87* 84* 80*  --  79* 74* 73* 68*  CREATININE 4.23* 4.78* 4.77*  --  5.43* 5.34* 4.94* 4.56*  CALCIUM 7.1* 7.0* 6.9*  --  7.9* 7.5* 7.6* 7.8*  MG 1.9 1.9  --  2.0  --   --   --   --   PHOS 6.2*  --   --  5.9*  --   --   --   --      CBC: Recent Labs  Lab 01/15/18 0520 01/16/18 0513 01/18/18 0818 01/19/18 0555 01/20/18 0441  WBC 12.9* 7.9 7.6 7.5 8.3  NEUTROABS 10.3* 5.7  --    --   --   HGB 9.5* 9.0* 9.4* 7.8* 7.7*  HCT 27.4* 25.7* 27.2* 22.4* 22.6*  MCV 92.7 92.8 93.7 94.1 93.9  PLT 294 241 205 176 187      Lab Results  Component Value Date   HEPBSAG Negative 01/18/2018   HEPBIGM Negative 01/18/2018      Microbiology:  Recent Results (from the past 240 hour(s))  Blood culture (routine x 2)     Status: None   Collection Time: 01/13/18  1:39 PM  Result Value Ref Range Status   Specimen Description RIGHT ANTECUBITAL  Final   Special Requests   Final    BOTTLES DRAWN AEROBIC AND ANAEROBIC Blood Culture adequate volume   Culture   Final    NO GROWTH 5 DAYS Performed at [facility], [address] [place],  [address]    Report Status 01/18/2018 FINAL  Final  Blood culture (routine x 2)     Status: None   Collection Time: 01/13/18  1:39 PM  Result Value Ref Range Status   Specimen Description LEFT ANTECUBITAL  Final  Special Requests   Final    BOTTLES DRAWN AEROBIC AND ANAEROBIC Blood Culture results may not be optimal due to an excessive volume of blood received in culture bottles   Culture   Final    NO GROWTH 5 DAYS Performed at [facility], [address] [place], [place] [address]    Report Status 01/18/2018 FINAL  Final  Urine Culture     Status: Abnormal   Collection Time: 01/13/18  1:39 PM  Result Value Ref Range Status   Specimen Description   Final    URINE, RANDOM Performed at [facility], [address], [place], [place] [address]    Special Requests   Final    NONE Performed at [facility], [address] [place], [place] [address]    Culture >=100,000 COLONIES/mL PSEUDOMONAS AERUGINOSA (A)  Final   Report Status 01/16/2018 FINAL  Final   Organism ID, Bacteria PSEUDOMONAS AERUGINOSA (A)  Final      Susceptibility   Pseudomonas aeruginosa - MIC*    CEFTAZIDIME 4 SENSITIVE Sensitive     CIPROFLOXACIN <=0.25 SENSITIVE Sensitive     GENTAMICIN <=1 SENSITIVE Sensitive      IMIPENEM 1 SENSITIVE Sensitive     PIP/TAZO <=4 SENSITIVE Sensitive     CEFEPIME 2 SENSITIVE Sensitive     * >=100,000 COLONIES/mL PSEUDOMONAS AERUGINOSA  MRSA PCR Screening     Status: None   Collection Time: 01/13/18  5:13 PM  Result Value Ref Range Status   MRSA by PCR NEGATIVE NEGATIVE Final    Comment:        The GeneXpert MRSA Assay (FDA approved for NASAL specimens only), is one component of a comprehensive MRSA colonization surveillance program. It is not intended to diagnose MRSA infection nor to guide or monitor treatment for MRSA infections. Performed at [facility], [address] [place], [place] [address]     Coagulation Studies: Recent Labs    01/18/18 1515  LABPROT 17.6*  INR 1.46    Urinalysis: No results for input(s): COLORURINE, LABSPEC, PHURINE, GLUCOSEU, HGBUR, BILIRUBINUR, KETONESUR, PROTEINUR, UROBILINOGEN, NITRITE, LEUKOCYTESUR in the last 72 hours.  Invalid input(s): APPERANCEUR    Imaging: No results found.   Medications:   . sodium chloride 50 mL/hr at 01/21/18 0820  . ceFEPime (MAXIPIME) IV    . heparin 850 Units/hr (01/21/18 0400)   . allopurinol  100 mg Oral Daily  . insulin aspart  0-9 Units Subcutaneous TID WC  . levothyroxine  50 mcg Oral QAC breakfast  . midodrine  10 mg Oral TID WC  . multivitamin-lutein  1 capsule Oral Daily  . OLANZapine  2.5 mg Oral QHS  . protein supplement shake  11 oz Oral BID BM  . senna-docusate  2 tablet Oral BID  . tuberculin  5 Units Intradermal Once  . vitamin C  500 mg Oral BID   [DISCONTINUED] ondansetron **OR** ondansetron (ZOFRAN) IV, polyethylene glycol  Assessment/ Plan:  77 y.o. Caucasian male with medical problems of chronic systolic congestive heart failure, diabetes type 2, atrial fibrillation, hypertension, history of gastric ulcer, coronary disease, who was admitted to [facility] on 01/13/2018 for evaluation of altered mental status.   1.  Acute renal failure on chronic  kidney disease stage III Baseline creatinine 1.30/GFR 51 from December 20, 2017 Admission creatinine 3.86, still critically high at 4.5 The cause of acute renal failure appears to be multifactorial including hypotension, sepsis, likely leading to ATN. Renal u/s shows left non  obstructing stone 5 mm; no hydronephrosis  -  Patient continues to have acute renal failure.  Creatinine slightly down to 4.56.  Given persistence of acute renal failure we recommended initiation of renal replacement therapy.  Patient willing to undergo this after discussion of risks, benefits, and alternatives.  We will request that the surgery consultation for placement of PermCath. His infection issues have been treated.  2.  UTI and sepsis, Pseudomonas aeuroginosa Treated with cefepime.   3. Hyperkalemia - potassium remains acceptable at 4.4.  We will continue to monitor.     LOS: 8 Marshaun Lortie 10/1/20192:48 PM  [place], [place]  Note: This note was prepared with Dragon dictation. Any transcription errors are unintentional

## 2018-01-21 NOTE — Clinical Social Work Note (Signed)
Patient's creatinine is elevated and thus is not ready for discharge. CSW has updated [facility]. Shela Leff MSW,LCSW [phone]

## 2018-01-21 NOTE — Progress Notes (Signed)
Physical Therapy Treatment Patient Details Name: Gregory Tyler MRN: [id number] DOB: 1941/02/19 Today's Date: 01/21/2018    History of Present Illness 77 year old male admitted for AMS and encephalopathy due to UTI, acute renal failure and failure to thrive and was at [facility].  He was here ~1 month ago, for weakness and diarrhea and had pressure ulcers B LES feet as well as sacral pressure sores.  PMH includes stage III CKD, prostate CA, PAF, gout, cardiomyopathy and chronic pelvic pain. 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  Student nurse in and assisted with care.  Rolling max a L/R for care.  Unable to hold side lying position without mod/max a.  Pt was able to get to edge of bed with mod a x 2.  Once sitting, remained upright for 13 minutes with supervision for safety.  While he had no LOB, he had a difficulty time repositioning his hands without assist for comfort with poor righting responses while doing so.  After fatigue, he was assisted back to supine with max a x 2 and max a x 2 to reposition in bed.  Overall tolerated well but he does voice a fear of falling several times during session and stated "I almost fell over" when he appeared in no danger or doing so while sitting.     Follow Up Recommendations  SNF     Equipment Recommendations  None recommended by PT    Recommendations for Other Services       Precautions / Restrictions Precautions Precautions: Fall Restrictions Weight Bearing Restrictions: No Other Position/Activity Restrictions: no WB restrictions in chart---has painful B ulcerations on feet and heels    Mobility  Bed Mobility Overal bed mobility: Needs Assistance Bed Mobility: Rolling;Supine to Sit;Sit to Supine Rolling: Max assist   Supine to sit: +2 for physical assistance Sit to supine: +2 for physical assistance      Transfers                 General transfer  comment: Patient unable to attempt standing this day  Ambulation/Gait             General Gait Details: unable to attempt walking this day   Stairs             Wheelchair Mobility    Modified Rankin (Stroke Patients Only)       Balance Overall balance assessment: Needs assistance Sitting-balance support: Bilateral upper extremity supported Sitting balance-Leahy Scale: Fair Sitting balance - Comments: able to sit without support, no LOB but requires supervision for safety.                                    Cognition Arousal/Alertness: Awake/alert Behavior During Therapy: WFL for tasks assessed/performed;Agitated Overall Cognitive Status: History of cognitive impairments - at baseline                                        Exercises Other Exercises Other Exercises: Pt inc, of urine and care provided with student nurse.    General Comments        Pertinent Vitals/Pain Pain Assessment: Faces Faces Pain Scale: Hurts little more Pain Location: B LEs, feet, heels  due to wounds Pain Descriptors / Indicators: Discomfort;Constant;Throbbing;Tender Pain Intervention(s): Limited activity within patient's tolerance;Monitored during session    Home Living                      Prior Function            PT Goals (current goals can now be found in the care plan section) Progress towards PT goals: Progressing toward goals    Frequency    Min 2X/week      PT Plan Current plan remains appropriate    Co-evaluation              AM-PAC PT "6 Clicks" Daily Activity  Outcome Measure  Difficulty turning over in bed (including adjusting bedclothes, sheets and blankets)?: Unable Difficulty moving from lying on back to sitting on the side of the bed? : Unable Difficulty sitting down on and standing up from a chair with arms (e.g., wheelchair, bedside commode, etc,.)?: Unable Help needed moving to and from a bed to  chair (including a wheelchair)?: Total Help needed walking in hospital room?: Total Help needed climbing 3-5 steps with a railing? : Total 6 Click Score: 6    End of Session   Activity Tolerance: Patient tolerated treatment well;Patient limited by fatigue Patient left: in bed;with bed alarm set;with call bell/phone within reach         Time: 0925-0950 PT Time Calculation (min) (ACUTE ONLY): 25 min  Charges:  $Therapeutic Activity: 8-22 mins                     Chesley Noon, PTA 01/21/18, 10:27 AM

## 2018-01-21 NOTE — Progress Notes (Signed)
ANTICOAGULATION CONSULT NOTE   Pharmacy Consult for Heparin Drip Management  Indication: atrial fibrillation  Allergies  Allergen Reactions  . Aspirin     GI Bleeding  . Codeine Other (See Comments)    BLISTERS Other reaction(s): Other (See Comments) BLISTERS   . Codeine Sulfate Other (See Comments)    Other reaction(s): Other (See Comments) BLISTERS BLISTERS    Patient Measurements: Height: 5\' 8"  (172.7 cm) Weight: 148 lb 13 oz (67.5 kg) IBW/kg (Calculated) : 68.4  Vital Signs: Temp: 98 F (36.7 C) (09/30 2101) Temp Source: Oral (09/30 2101) BP: 120/81 (09/30 2101) Pulse Rate: 56 (09/30 2101)  Labs: Recent Labs    01/18/18 0818  01/18/18 0820 01/18/18 1515  01/19/18 0555  01/19/18 1856 01/20/18 0441 01/20/18 1310 01/21/18 0008  HGB 9.4*  --   --   --   --  7.8*  --   --  7.7*  --   --   HCT 27.2*  --   --   --   --  22.4*  --   --  22.6*  --   --   PLT 205  --   --   --   --  176  --   --  187  --   --   APTT  --    < > 102* 111*   < >  --    < > 68* 68* 61* 72*  LABPROT  --   --   --  17.6*  --   --   --   --   --   --   --   INR  --   --   --  1.46  --   --   --   --   --   --   --   HEPARINUNFRC  --   --   --   --    < > 1.05*  --  0.76* 0.77* 0.72*  --   CREATININE  --   --  5.43*  --   --  5.34*  --   --  4.94*  --   --    < > = values in this interval not displayed.    Estimated Creatinine Clearance: 11.8 mL/min (A) (by C-G formula based on SCr of 4.94 mg/dL (H)).   Medical History: Past Medical History:  Diagnosis Date  . Anemia   . Anxiety   . Arthritis   . Cardiomyopathy ([place])    a. 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  Marland Kitchen Chronic combined systolic (congestive) and diastolic (congestive) heart failure ([place])    a. 05/2015 TEE: EF 35-40% (in setting of Afib).  . Chronic Pelvic pain   . Coronary artery disease    a. 05/2015 CTA Chest: 3 vessel coronary Ca2+.  . Depression   . Diabetes mellitus without complication ([place])    . Emphysema lung ([place])   . Gastric ulcer   . Gout   . Hyperlipidemia   . Hypertension   . Nephrolithiasis   . PAF (paroxysmal atrial fibrillation) ([place])    a. 05/2015 s/p DCCV; b. 03/2016 s/p DCCV; c. CHA2DS2VASc = 6 (coumadin).  . Prostate cancer [facility])    Prostate  . Stage III chronic kidney disease ([place])   . Weight loss    50 lb since Apr 24, 2015    Medications:  Scheduled:  . allopurinol  100 mg Oral Daily  . insulin aspart  0-9 Units Subcutaneous TID WC  . levothyroxine  50 mcg Oral QAC breakfast  . midodrine  10 mg Oral TID WC  . OLANZapine  2.5 mg Oral QHS  . senna-docusate  2 tablet Oral BID   Infusions:  . sodium chloride 50 mL/hr at 01/20/18 1800  . ceFEPime (MAXIPIME) IV 1 g (01/20/18 2057)  . heparin 850 Units/hr (01/20/18 1800)    Assessment:  Pharmacy consulted for heparin drip management for 77 yo male previously ordered home dose of apixaban 2.5mg  PO BID. Patient with acute renal failure. Last dose of apixaban at 1200 on 9/27. Home dose of apixaban 2.5mg  BID due to bleeding issues as an outpatient per son.   Heparin drip was started at  950 units/hr starting at 0000 9/28  09/28 1515 aPTT 111s:  09/29 0103 aPTT 101s, HL 1.05 IU/mL: rate decreased to 750 units/hr 09/29 1103 aPTT 73s 09/29 1856 aPTT 68s, HL 0.76 IU/mL 09/30 0441 aPTT 68s, HL 0.77 IU/mL 09/30 1310 aPTT 61s, HL 0.72 IU/mL  The aPTT and HL are not correlating, therefore I will consider the aPTT for dosing purposes to be subtherapeutic  Goal of Therapy:  Heparin level 0.3-0.7 units/ml  APTT 66-102s  Monitor platelets by anticoagulation protocol: Yes   Plan:  Will increase the heparin infusion rate to 850 units/hr and obtain the next aPTT at midnight 10/01. Will obtain anti-Xa levels daily until aPTT and anti-Xa levels correlate.   10/01 0008 aPTT 72. Continue current regimen. Recheck heparin level, aPTT, and CBC with tomorrow AM labs.  Pharmacy will continue to monitor and adjust per  consult.   Darbi Chandran S, PharmD 01/21/2018,12:58 AM

## 2018-01-21 NOTE — Progress Notes (Signed)
[facility] [place] NAME: Gregory Tyler    MR#:  [id number]  DATE OF BIRTH:  09-06-1940  SUBJECTIVE:  CHIEF COMPLAINT:   Chief Complaint  Patient presents with  . Altered Mental Status  And discussion with nephrology-plans for hemodialysis going forward, for hemodialysis catheter placement on tomorrow with dialysis subsequently, discontinue cefepime after today's dose REVIEW OF SYSTEMS:  CONSTITUTIONAL: No fever, fatigue or weakness.  EYES: No blurred or double vision.  EARS, NOSE, AND THROAT: No tinnitus or ear pain.  RESPIRATORY: No cough, shortness of breath, wheezing or hemoptysis.  CARDIOVASCULAR: No chest pain, orthopnea, edema.  GASTROINTESTINAL: No nausea, vomiting, diarrhea or abdominal pain.  GENITOURINARY: No dysuria, hematuria.  ENDOCRINE: No polyuria, nocturia,  HEMATOLOGY: No anemia, easy bruising or bleeding SKIN: No rash or lesion. MUSCULOSKELETAL: No joint pain or arthritis.   NEUROLOGIC: No tingling, numbness, weakness.  PSYCHIATRY: No anxiety or depression.   ROS  DRUG ALLERGIES:   Allergies  Allergen Reactions  . Aspirin     GI Bleeding  . Codeine Other (See Comments)    BLISTERS Other reaction(s): Other (See Comments) BLISTERS   . Codeine Sulfate Other (See Comments)    Other reaction(s): Other (See Comments) BLISTERS BLISTERS    VITALS:  Blood pressure (!) 107/54, pulse 60, temperature (!) 97.5 F (36.4 C), temperature source Oral, resp. rate 16, height 5\' 8"  (1.727 m), weight 67.5 kg, SpO2 95 %.  PHYSICAL EXAMINATION:  GENERAL:  77 y.o.-year-old patient lying in the bed with no acute distress.  EYES: Pupils equal, round, reactive to light and accommodation. No scleral icterus. Extraocular muscles intact.  HEENT: Head atraumatic, normocephalic. Oropharynx and nasopharynx clear.  NECK:  Supple, no jugular venous distention. No thyroid enlargement, no tenderness.  LUNGS: Normal breath sounds  bilaterally, no wheezing, rales,rhonchi or crepitation. No use of accessory muscles of respiration.  CARDIOVASCULAR: S1, S2 normal. No murmurs, rubs, or gallops.  ABDOMEN: Soft, nontender, nondistended. Bowel sounds present. No organomegaly or mass.  EXTREMITIES: No pedal edema, cyanosis, or clubbing.  NEUROLOGIC: Cranial nerves II through XII are intact. Muscle strength 5/5 in all extremities. Sensation intact. Gait not checked.  PSYCHIATRIC: The patient is alert and oriented x 3.  SKIN: No obvious rash, lesion, or ulcer.   Physical Exam LABORATORY PANEL:   CBC Recent Labs  Lab 01/20/18 0441  WBC 8.3  HGB 7.7*  HCT 22.6*  PLT 187   ------------------------------------------------------------------------------------------------------------------  Chemistries  Recent Labs  Lab 01/18/18 0818  01/20/18 0441 01/21/18 0834  NA  --    < > 140 140  K  --    < > 4.5 4.4  CL  --    < > 113* 113*  CO2  --    < > 19* 18*  GLUCOSE  --    < > 126* 86  BUN  --    < > 73* 68*  CREATININE  --    < > 4.94* 4.56*  CALCIUM  --    < > 7.6* 7.8*  MG 2.0  --   --   --   AST  --    < > 127*  --   ALT  --    < > 250*  --   ALKPHOS  --    < > 272*  --   BILITOT  --    < > 0.5  --    < > = values in this interval not displayed.   ------------------------------------------------------------------------------------------------------------------  Cardiac Enzymes No results for input(s): TROPONINI in the last 168 hours. ------------------------------------------------------------------------------------------------------------------  RADIOLOGY:  No results found.  ASSESSMENT AND PLAN:  IMPRESSION AND PLAN: Patient 77 year old nursing home due to altered mental status  *Acute encephalopathy due to sepsis Resolved  *Acute septic shock due to Pseudomonas UTI  Resolved Successfully weaned off pressors Treated with 6-day course of cefepime-discontinued on January 21, 2018  *Acute renal  failure  Stable  In discussion with nephrology/Dr. Lateef-for hemodialysis catheter placement on tomorrow with subsequent dialysis thereafter    *Acute transaminitis  Resolving Suspected due to sepsis/acute shock liver GI did see patient while in house-no intervention recommended, continue to avoid hepatotoxic agents, hepatic panel negative, abdominal ultrasound per radiology noted for fatty liver/hepatocellular disease    *Chronic systolic CHF without exacerbation repeat echo shows normal EF Stable on current regiment  *Chronic diabetes mellitus type 2 Stable on current regiment  *Hypertension  Normotensive without antihypertensive medications   *Paroxysmal atrial fibrillation  Continue heparin drip for now-can change to Eliquis on discharge  Discharge to skilled nursing facility after dialysis chair secured on outpatient setting   all the records are reviewed and case discussed with Care Management/Social Workerr. Management plans discussed with the patient, family and they are in agreement.  CODE STATUS: dnr  TOTAL TIME TAKING CARE OF THIS PATIENT: 35 minutes.     POSSIBLE D/C IN 2-3 DAYS, DEPENDING ON CLINICAL CONDITION.   Avel Peace Salary M.D on 01/21/2018   Between 7am to 6pm - Pager - [phone]  After 6pm go to [email or web address] - password [email or web address] [facility]  Office  [phone]  CC: Primary care physician; [name], MD  Note: This dictation was prepared with Dragon dictation along with smaller phrase technology. Any transcriptional errors that result from this process are unintentional.

## 2018-01-21 NOTE — Progress Notes (Signed)
Initial Nutrition Assessment  DOCUMENTATION CODES:   Severe malnutrition in context of chronic illness  INTERVENTION:   Premier Protein BID, each supplement provides 160 kcal and 30 grams of protein.   Magic cup TID with meals, each supplement provides 290 kcal and 9 grams of protein  Ocuvite daily for wound healing (provides zinc, vitamin A, vitamin C, Vitamin E, copper, and selenium)  Vitamin C 579m po BID  NUTRITION DIAGNOSIS:   Severe Malnutrition related to chronic illness(CHF, DM, CKD, gastric ulcers, prostate cancer ) as evidenced by moderate to severe fat depletions, moderate to severe muscle depletions, 11 percent weight loss in 7 months.  GOAL:   Patient will meet greater than or equal to 90% of their needs  MONITOR:   PO intake, Supplement acceptance, Labs, Weight trends, Skin, I & O's  REASON FOR ASSESSMENT:   LOS    ASSESSMENT:    77y.o. male   admitted on 01/13/2018 with a known history of chronic systolic CHF, diabetes type 2, paroxysmal atrial fibrillation, coronary artery disease, gastric ulcer, hypertension, chronic kidney disease, hyperlipidemia who currently resides in a skilled nursing facility brought from there for altered mental status.   Met with pt in room today. Pt is a poor historian but reports good appetite and oral intake pta. Pt resides in a SNF along with his wife who has Parkinson's dementia. Pt reports that he is eating 90% of his meals in hospital but he is upset that he is being told that he cant have certain foods on his trays. Pt does not like supplements and reports numerous foods that upset his stomach including greens, orange juice, chocolate and milk (reports he does eat cheese, yogurt and sherbet ice cream with no issues). Per chart, pt with 18lb(11%) wt loss over the past 7 months which is significant. Pt with numerous wounds as well as ecchymosis; recommend vitamin C supplementation. RD will order supplements and vitamins to  encourage wound healing. Pt will need to continue supplements and vitamins after discharge. Pt enjoys V8 juice and would like to have this on his trays; RD will make note in [facility] allowing pt to have this.   Medications reviewed and include: allopurinol, insulin, synthroid, senokot, NaCl _0 /hr, cefepime, heparin  Labs reviewed:  BUN 68(H), creat 4.56(H) P 5.9(H), Mg 2.0 wnl- 9/28 Hgb 7.7(L), Hct  22.6(L) cbgs- 102, 157, 195, 235, 86 x 24 hrs AIC 6.6(H)- 7/26  NUTRITION - FOCUSED PHYSICAL EXAM:    Most Recent Value  Orbital Region  Severe depletion  Upper Arm Region  Moderate depletion  Thoracic and Lumbar Region  Moderate depletion  Buccal Region  Mild depletion  Temple Region  Severe depletion  Clavicle Bone Region  Severe depletion  Clavicle and Acromion Bone Region  Severe depletion  Scapular Bone Region  Moderate depletion  Dorsal Hand  Severe depletion  Patellar Region  Moderate depletion  Anterior Thigh Region  Moderate depletion  Posterior Calf Region  Moderate depletion  Edema (RD Assessment)  Mild  Hair  Reviewed  Eyes  Reviewed  Mouth  Reviewed  Skin  Reviewed [[email or web address]]  Nails  Reviewed     Diet Order:   Diet Order            Diet Carb Modified Fluid consistency: Thin; Room service appropriate? Yes  Diet effective now             EDUCATION NEEDS:   Education needs have been addressed  Skin:  Skin Assessment:  Reviewed RN Assessment(wounds on R heel, R toe, L foot, Stage III scarum and buttocks )  Last BM:  9/29- type 6  Height:   Ht Readings from Last 1 Encounters:  01/13/18 _0  (1.727 m)    Weight:   Wt Readings from Last 1 Encounters:  01/13/18 67.5 kg    Ideal Body Weight:  70 kg  BMI:  Body mass index is 22.63 kg/m.  Estimated Nutritional Needs:   Kcal:  1800-2100kcal/day   Protein:  102-115g/day   Fluid:  >1.7L/day   Koleen Distance MS, RD, LDN Pager #- [phone] Office#- [phone] After Hours Pager:  [phone]

## 2018-01-21 NOTE — Progress Notes (Signed)
Family Meeting Note  Advance Directive:yes  Today a meeting took place with the Patient.  Patient is able to participate   The following clinical team members were present during this meeting:MD  The following were discussed:Patient's diagnosis:ARF , Patient's progosis: Unable to determine and Goals for treatment: DNR  Additional follow-up to be provided: prn  Time spent during discussion:20 minutes  Gorden Harms, MD

## 2018-01-21 NOTE — Care Management (Signed)
Per nephrology plan to move forward with perm cath placement, and outpatient HD.  Elvera Bicker dialysis liaison notified.

## 2018-01-22 ENCOUNTER — Inpatient Hospital Stay
Admission: EM | Disposition: A | Discharge status: Skilled Nursing Facility | Payer: Self-pay | Source: Home / Self Care | Attending: Family Medicine

## 2018-01-22 DIAGNOSIS — Z992 Dependence on renal dialysis: Secondary | ICD-10-CM

## 2018-01-22 DIAGNOSIS — N186 End stage renal disease: Secondary | ICD-10-CM

## 2018-01-22 HISTORY — PX: DIALYSIS/PERMA CATHETER INSERTION: CATH118288

## 2018-01-22 LAB — CBC
HCT: 22.1 % — ABNORMAL LOW (ref 40.0–52.0)
HEMOGLOBIN: 7.4 g/dL — AB (ref 13.0–18.0)
MCH: 32.6 pg (ref 26.0–34.0)
MCHC: 33.6 g/dL (ref 32.0–36.0)
MCV: 96.9 fL (ref 80.0–100.0)
Platelets: 191 10*3/uL (ref 150–440)
RBC: 2.28 MIL/uL — AB (ref 4.40–5.90)
RDW: 15.8 % — ABNORMAL HIGH (ref 11.5–14.5)
WBC: 8.2 10*3/uL (ref 3.8–10.6)

## 2018-01-22 LAB — BASIC METABOLIC PANEL
ANION GAP: 7 (ref 5–15)
BUN: 73 mg/dL — ABNORMAL HIGH (ref 8–23)
CO2: 18 mmol/L — ABNORMAL LOW (ref 22–32)
Calcium: 7.8 mg/dL — ABNORMAL LOW (ref 8.9–10.3)
Chloride: 115 mmol/L — ABNORMAL HIGH (ref 98–111)
Creatinine, Ser: 4.34 mg/dL — ABNORMAL HIGH (ref 0.61–1.24)
GFR calc non Af Amer: 12 mL/min — ABNORMAL LOW (ref >60)
GFR, EST AFRICAN AMERICAN: 14 mL/min — AB (ref >60)
Glucose, Bld: 136 mg/dL — ABNORMAL HIGH (ref 70–99)
POTASSIUM: 4.7 mmol/L (ref 3.5–5.1)
SODIUM: 140 mmol/L (ref 135–145)

## 2018-01-22 LAB — HEPARIN LEVEL (UNFRACTIONATED): Heparin Unfractionated: 0.49 IU/mL (ref 0.30–0.70)

## 2018-01-22 LAB — APTT: APTT: 64 s — AB (ref 24–36)

## 2018-01-22 LAB — GLUCOSE, CAPILLARY
GLUCOSE-CAPILLARY: 97 mg/dL (ref 70–99)
GLUCOSE-CAPILLARY: 87 mg/dL (ref 70–99)
GLUCOSE-CAPILLARY: 206 mg/dL — AB (ref 70–99)
Glucose-Capillary: 197 mg/dL — ABNORMAL HIGH (ref 70–99)
Glucose-Capillary: 197 mg/dL — ABNORMAL HIGH (ref 70–99)

## 2018-01-22 LAB — PREPARE RBC (CROSSMATCH)

## 2018-01-22 LAB — ABO/RH: ABO/RH(D): O POS

## 2018-01-22 SURGERY — DIALYSIS/PERMA CATHETER INSERTION
Anesthesia: Moderate Sedation

## 2018-01-22 MED ORDER — HEPARIN SODIUM (PORCINE) 10000 UNIT/ML IJ SOLN
INTRAMUSCULAR | Status: AC
  Filled 2018-01-22: qty 1

## 2018-01-22 MED ORDER — LIDOCAINE-EPINEPHRINE (PF) 1 %-1:200000 IJ SOLN
INTRAMUSCULAR | Status: AC
  Filled 2018-01-22: qty 30

## 2018-01-22 MED ORDER — ANTICOAGULANT SODIUM CITRATE 4% (200MG/5ML) IV SOLN
5.0000 mL | Freq: Once | Status: AC
  Administered 2018-01-24: 5 mL via INTRAVENOUS
  Filled 2018-01-22 (×2): qty 5

## 2018-01-22 MED ORDER — FUROSEMIDE 10 MG/ML IJ SOLN
20.0000 mg | Freq: Once | INTRAMUSCULAR | Status: AC
  Administered 2018-01-22: 20 mg via INTRAVENOUS
  Filled 2018-01-22: qty 4

## 2018-01-22 MED ORDER — APIXABAN 2.5 MG PO TABS
2.5000 mg | ORAL_TABLET | Freq: Two times a day (BID) | ORAL | Status: DC
  Administered 2018-01-24 – 2018-01-30 (×12): 2.5 mg via ORAL
  Filled 2018-01-22 (×14): qty 1

## 2018-01-22 MED ORDER — SODIUM CHLORIDE FLUSH 0.9 % IV SOLN
INTRAVENOUS | Status: AC
  Filled 2018-01-22: qty 20

## 2018-01-22 MED ORDER — ACETAMINOPHEN 325 MG PO TABS
650.0000 mg | ORAL_TABLET | Freq: Once | ORAL | Status: AC
  Administered 2018-01-22: 650 mg via ORAL
  Filled 2018-01-22: qty 2

## 2018-01-22 MED ORDER — SODIUM BICARBONATE 650 MG PO TABS
1300.0000 mg | ORAL_TABLET | Freq: Two times a day (BID) | ORAL | Status: DC
  Administered 2018-01-24 – 2018-01-27 (×7): 1300 mg via ORAL
  Filled 2018-01-22 (×8): qty 2

## 2018-01-22 MED ORDER — CEFAZOLIN SODIUM-DEXTROSE 2-4 GM/100ML-% IV SOLN
INTRAVENOUS | Status: AC
  Administered 2018-01-22: 2 g via INTRAVENOUS
  Filled 2018-01-22: qty 100

## 2018-01-22 MED ORDER — FENTANYL CITRATE (PF) 100 MCG/2ML IJ SOLN
INTRAMUSCULAR | Status: DC | PRN
  Administered 2018-01-22: 50 ug via INTRAVENOUS

## 2018-01-22 MED ORDER — FENTANYL CITRATE (PF) 100 MCG/2ML IJ SOLN
INTRAMUSCULAR | Status: AC
  Filled 2018-01-22: qty 2

## 2018-01-22 MED ORDER — SODIUM CHLORIDE 0.9% IV SOLUTION
Freq: Once | INTRAVENOUS | Status: AC
  Administered 2018-01-22: 17:00:00 via INTRAVENOUS

## 2018-01-22 MED ORDER — HEPARIN (PORCINE) IN NACL 1000-0.9 UT/500ML-% IV SOLN
INTRAVENOUS | Status: AC
  Filled 2018-01-22: qty 500

## 2018-01-22 MED ORDER — MIDAZOLAM HCL 2 MG/2ML IJ SOLN
INTRAMUSCULAR | Status: DC | PRN
  Administered 2018-01-22: 2 mg via INTRAVENOUS

## 2018-01-22 MED ORDER — SODIUM BICARBONATE 650 MG PO TABS: 650.0000 mg | ORAL_TABLET | Freq: Two times a day (BID) | ORAL | Status: DC

## 2018-01-22 MED ORDER — DIPHENHYDRAMINE HCL 25 MG PO CAPS
25.0000 mg | ORAL_CAPSULE | Freq: Once | ORAL | Status: AC
  Administered 2018-01-22: 25 mg via ORAL
  Filled 2018-01-22: qty 1

## 2018-01-22 MED ORDER — MIDAZOLAM HCL 5 MG/5ML IJ SOLN
INTRAMUSCULAR | Status: AC
  Filled 2018-01-22: qty 5

## 2018-01-22 SURGICAL SUPPLY — 11 items
CATH PALINDROME RT-P 15FX19CM (CATHETERS) ×3 IMPLANT
DERMABOND ADVANCED (GAUZE/BANDAGES/DRESSINGS) ×2
DERMABOND ADVANCED .7 DNX12 (GAUZE/BANDAGES/DRESSINGS) ×1 IMPLANT
DRAPE INCISE IOBAN 66X45 STRL (DRAPES) ×3 IMPLANT
NEEDLE ENTRY 21GA 7CM ECHOTIP (NEEDLE) ×3 IMPLANT
PACK ANGIOGRAPHY (CUSTOM PROCEDURE TRAY) ×3 IMPLANT
SET INTRO CAPELLA COAXIAL (SET/KITS/TRAYS/PACK) ×3 IMPLANT
SUT MNCRL AB 4-0 PS2 18 (SUTURE) ×3 IMPLANT
SUT PROLENE 0 CT 1 30 (SUTURE) ×3 IMPLANT
SUT SILK 0 FSL (SUTURE) ×3 IMPLANT
TOWEL OR 17X26 4PK STRL BLUE (TOWEL DISPOSABLE) ×3 IMPLANT

## 2018-01-22 NOTE — Op Note (Signed)
OPERATIVE NOTE   PROCEDURE: 1. Insertion of tunneled dialysis catheter right IJ approach with ultrasound and fluoroscopic guidance.  PRE-OPERATIVE DIAGNOSIS: End-stage renal disease now requiring hemodialysis  POST-OPERATIVE DIAGNOSIS: Same  SURGEON: Gregory Tyler.  ANESTHESIA: Conscious sedation was administered under my direct supervision by the interventional radiology RN. IV Versed plus fentanyl were utilized. Continuous ECG, pulse oximetry and blood pressure was monitored throughout the entire procedure. Conscious sedation was for a total of 23 minutes.  ESTIMATED BLOOD LOSS: Minimal cc  CONTRAST USED:  None  FLUOROSCOPY TIME: 0.4 minutes  INDICATIONS:   Gregory Tyler a 77 y.o. y.o. male who presents with worsening renal function is now been started on dialysis.  He is undergoing transition from a temporary catheter to a tunneled catheter.  Risks and benefits of been reviewed patient has agreed to proceed.  DESCRIPTION: After obtaining full informed written consent, the patient was positioned supine. The right neck and chest wall was prepped and draped in a sterile fashion. Ultrasound was placed in a sterile sleeve. Ultrasound was utilized to identify the right internal jugular vein which is noted to be echolucent and compressible indicating patency. Image is recorded for the permanent record. Under direct ultrasound visualization a micro-needle is inserted into the vein followed by the micro-wire. Micro-sheath was then advanced and a J wire is inserted without difficulty under fluoroscopic guidance. Small counterincision was made at the wire insertion site. Dilators are passed over the wire and the tunneled dialysis catheter is fed into the central venous system without difficulty.  Under fluoroscopy the catheter tip positioned at the atrial caval junction. The catheter is then approximated to the chest wall and an exit site selected. 1% lidocaine is infiltrated in soft  tissues at this level small incision is made and the tunneling device is then passed from the exit site to the neck counterincision. Catheter is then connected to the tunneling device and the catheter was pulled subcutaneously. It is then transected and the hub assembly connected without difficulty. Both lumens aspirate and flush easily. After verification of smooth contour with proper tip position under fluoroscopy the catheter is packed with 5000 units of heparin per lumen.  Catheter secured to the skin of the right chest wall with 0 silk. A sterile dressing is applied with a Biopatch.  COMPLICATIONS: None  CONDITION: Good  Gregory Tyler [facility]. Office:  [phone]   01/22/2018,11:50 AM

## 2018-01-22 NOTE — Progress Notes (Signed)
OT Cancellation Note  Patient Details Name: Gregory Tyler MRN: [id number] DOB: June 19, 1940   Cancelled Treatment:    Reason Eval/Treat Not Completed: Medical issues which prohibited therapy. Per chart review, pt received R IJ hemodialysis catheter by vascular surgery. Scheduled for hemodialysis later today. Will hold OT tx this afternoon and re-attempt at later date/time as pt is available and medically appropriate.   Jeni Salles, MPH, MS, OTR/L ascom [phone] 01/22/18, 1:58 PM

## 2018-01-22 NOTE — Care Management Important Message (Signed)
Copy of signed IM left in patient's room (out for procedure). 

## 2018-01-22 NOTE — H&P (Signed)
[facility] History & Physical Update  The patient was interviewed and re-examined.  The patient's previous History and Physical has been reviewed and is unchanged.  There is no change in the plan of care. We plan to proceed with the scheduled procedure.  Hortencia Pilar, MD  01/22/2018, 11:48 AM

## 2018-01-22 NOTE — Progress Notes (Signed)
ANTICOAGULATION CONSULT NOTE   Pharmacy Consult for Heparin Drip Management  Indication: atrial fibrillation  Allergies  Allergen Reactions  . Aspirin     GI Bleeding  . Codeine Other (See Comments)    BLISTERS Other reaction(s): Other (See Comments) BLISTERS   . Codeine Sulfate Other (See Comments)    Other reaction(s): Other (See Comments) BLISTERS BLISTERS    Patient Measurements: Height: 5\' 8"  (172.7 cm) Weight: 148 lb 13 oz (67.5 kg) IBW/kg (Calculated) : 68.4  Vital Signs: Temp: 98 F (36.7 C) (10/02 0415) Temp Source: Oral (10/02 0415) BP: 117/53 (10/02 0415) Pulse Rate: 76 (10/02 0415)  Labs: Recent Labs    01/19/18 0555  01/20/18 0441 01/20/18 1310 01/21/18 0008 01/21/18 0834 01/22/18 0341  HGB 7.8*  --  7.7*  --   --   --  7.4*  HCT 22.4*  --  22.6*  --   --   --  22.1*  PLT 176  --  187  --   --   --  191  APTT  --    < > 68* 61* 72*  --  64*  HEPARINUNFRC 1.05*   < > 0.77* 0.72*  --   --  0.49  CREATININE 5.34*  --  4.94*  --   --  4.56* 4.34*   < > = values in this interval not displayed.    Estimated Creatinine Clearance: 13.4 mL/min (A) (by C-G formula based on SCr of 4.34 mg/dL (H)).   Medical History: Past Medical History:  Diagnosis Date  . Anemia   . Anxiety   . Arthritis   . Cardiomyopathy ([place])    a. 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  Marland Kitchen Chronic combined systolic (congestive) and diastolic (congestive) heart failure ([place])    a. 05/2015 TEE: EF 35-40% (in setting of Afib).  . Chronic Pelvic pain   . Coronary artery disease    a. 05/2015 CTA Chest: 3 vessel coronary Ca2+.  . Depression   . Diabetes mellitus without complication ([place])   . Emphysema lung ([place])   . Gastric ulcer   . Gout   . Hyperlipidemia   . Hypertension   . Nephrolithiasis   . PAF (paroxysmal atrial fibrillation) ([place])    a. 05/2015 s/p DCCV; b. 03/2016 s/p DCCV; c. CHA2DS2VASc = 6 (coumadin).  . Prostate cancer [facility])    Prostate  .  Stage III chronic kidney disease ([place])   . Weight loss    50 lb since Apr 24, 2015    Medications:  Scheduled:  . allopurinol  100 mg Oral Daily  . insulin aspart  0-9 Units Subcutaneous TID WC  . levothyroxine  50 mcg Oral QAC breakfast  . midodrine  10 mg Oral TID WC  . multivitamin-lutein  1 capsule Oral Daily  . OLANZapine  2.5 mg Oral QHS  . protein supplement shake  11 oz Oral BID BM  . senna-docusate  2 tablet Oral BID  . tuberculin  5 Units Intradermal Once  . vitamin C  500 mg Oral BID   Infusions:  . sodium chloride 50 mL/hr at 01/21/18 0820  . sodium chloride    .  ceFAZolin (ANCEF) IV    . heparin 850 Units/hr (01/21/18 1605)    Assessment:  Pharmacy consulted for heparin drip management for 77 yo male previously ordered home dose of apixaban 2.5mg  PO BID. Patient with acute renal failure. Last dose of apixaban at 1200  on 9/27. Home dose of apixaban 2.5mg  BID due to bleeding issues as an outpatient per son.   Heparin drip was started at  950 units/hr starting at 0000 9/28  09/28 1515 aPTT 111s:  09/29 0103 aPTT 101s, HL 1.05 IU/mL: rate decreased to 750 units/hr 09/29 1103 aPTT 73s 09/29 1856 aPTT 68s, HL 0.76 IU/mL 09/30 0441 aPTT 68s, HL 0.77 IU/mL 09/30 1310 aPTT 61s, HL 0.72 IU/mL  The aPTT and HL are not correlating, therefore I will consider the aPTT for dosing purposes to be subtherapeutic  Goal of Therapy:  Heparin level 0.3-0.7 units/ml  APTT 66-102s  Monitor platelets by anticoagulation protocol: Yes   Plan:  Will increase the heparin infusion rate to 850 units/hr and obtain the next aPTT at midnight 10/01. Will obtain anti-Xa levels daily until aPTT and anti-Xa levels correlate.   10/01 0008 aPTT 72. Continue current regimen. Recheck heparin level, aPTT, and CBC with tomorrow AM labs.  10/02 AM aPTT 64, heparin level 0.49. Increase rate to 950 units/hr and recheck aPTT in 6 hours.  Pharmacy will continue to monitor and adjust per consult.    Raybon Conard S, PharmD 01/22/2018,5:25 AM

## 2018-01-22 NOTE — Progress Notes (Signed)
[facility] NAME: Gregory Tyler    MR#:  [id number]  DATE OF BIRTH:  12-03-1940  SUBJECTIVE:  CHIEF COMPLAINT:   Chief Complaint  Patient presents with  . Altered Mental Status  Status post right IJ hemodialysis catheter earlier today, for hemodialysis REVIEW OF SYSTEMS:  CONSTITUTIONAL: No fever, fatigue or weakness.  EYES: No blurred or double vision.  EARS, NOSE, AND THROAT: No tinnitus or ear pain.  RESPIRATORY: No cough, shortness of breath, wheezing or hemoptysis.  CARDIOVASCULAR: No chest pain, orthopnea, edema.  GASTROINTESTINAL: No nausea, vomiting, diarrhea or abdominal pain.  GENITOURINARY: No dysuria, hematuria.  ENDOCRINE: No polyuria, nocturia,  HEMATOLOGY: No anemia, easy bruising or bleeding SKIN: No rash or lesion. MUSCULOSKELETAL: No joint pain or arthritis.   NEUROLOGIC: No tingling, numbness, weakness.  PSYCHIATRY: No anxiety or depression.   ROS  DRUG ALLERGIES:   Allergies  Allergen Reactions  . Aspirin     GI Bleeding  . Codeine Other (See Comments)    BLISTERS Other reaction(s): Other (See Comments) BLISTERS   . Codeine Sulfate Other (See Comments)    Other reaction(s): Other (See Comments) BLISTERS BLISTERS    VITALS:  Blood pressure (!) 128/58, pulse (!) 115, temperature 97.7 F (36.5 C), temperature source Oral, resp. rate 16, height 5\' 8"  (1.727 m), weight 67.5 kg, SpO2 97 %.  PHYSICAL EXAMINATION:  GENERAL:  77 y.o.-year-old patient lying in the bed with no acute distress.  EYES: Pupils equal, round, reactive to light and accommodation. No scleral icterus. Extraocular muscles intact.  HEENT: Head atraumatic, normocephalic. Oropharynx and nasopharynx clear.  NECK:  Supple, no jugular venous distention. No thyroid enlargement, no tenderness.  LUNGS: Normal breath sounds bilaterally, no wheezing, rales,rhonchi or crepitation. No use of accessory muscles of respiration.   CARDIOVASCULAR: S1, S2 normal. No murmurs, rubs, or gallops.  ABDOMEN: Soft, nontender, nondistended. Bowel sounds present. No organomegaly or mass.  EXTREMITIES: No pedal edema, cyanosis, or clubbing.  NEUROLOGIC: Cranial nerves II through XII are intact. Muscle strength 5/5 in all extremities. Sensation intact. Gait not checked.  PSYCHIATRIC: The patient is alert and oriented x 3.  SKIN: No obvious rash, lesion, or ulcer.   Physical Exam LABORATORY PANEL:   CBC Recent Labs  Lab 01/22/18 0341  WBC 8.2  HGB 7.4*  HCT 22.1*  PLT 191   ------------------------------------------------------------------------------------------------------------------  Chemistries  Recent Labs  Lab 01/18/18 0818  01/21/18 0834 01/22/18 0341  NA  --    < > 140 140  K  --    < > 4.4 4.7  CL  --    < > 113* 115*  CO2  --    < > 18* 18*  GLUCOSE  --    < > 86 136*  BUN  --    < > 68* 73*  CREATININE  --    < > 4.56* 4.34*  CALCIUM  --    < > 7.8* 7.8*  MG 2.0  --   --   --   AST  --    < > 115*  --   ALT  --    < > 212*  --   ALKPHOS  --    < > 251*  --   BILITOT  --    < > 0.5  --    < > = values in this interval not displayed.   ------------------------------------------------------------------------------------------------------------------  Cardiac Enzymes No results for input(s): TROPONINI in  the last 168 hours. ------------------------------------------------------------------------------------------------------------------  RADIOLOGY:  No results found.  ASSESSMENT AND PLAN:  IMPRESSION AND PLAN: Patient 77 year old nursing home due to altered mental status  *Acute ESRD Status post right IJ hemodialysis catheter placement by vascular surgery For hemodialysis later today Arrange for outpatient hemodialysis chair  *Acute encephalopathy due to sepsis Resolved  *Acute septic shock due to Pseudomonas UTI  Resolved Successfully weaned off pressors Treated with 6-day  course of cefepime-discontinued on January 21, 2018  *Acute transaminitis  Resolving Suspected due to sepsis/acute shock liver GI did see patient while in house-no intervention recommended, continue to avoid hepatotoxic agents, hepatic panel negative, abdominal ultrasound per radiology noted for fatty liver/hepatocellular disease    *Chronic systolic CHF without exacerbation repeat echo shows normal EF Stable on current regiment  *Chronic diabetes mellitus type 2 Stable on current regiment  *Hypertension  Normotensive without antihypertensive medications   *Paroxysmal atrial fibrillation  Discontinue heparin drip, start Eliquis   Discharge to skilled nursing facility after dialysis chair secured in outpatient setting   all the records are reviewed and case discussed with Care Management/Social Workerr. Management plans discussed with the patient, family and they are in agreement.  CODE STATUS: dnr  TOTAL TIME TAKING CARE OF THIS PATIENT: 35 minutes.     POSSIBLE D/C IN 2-3 DAYS, DEPENDING ON CLINICAL CONDITION.   Gregory Tyler M.D on 01/22/2018   Between 7am to 6pm - Pager - [phone]  After 6pm go to [email or web address] - password [email or web address] [facility]  Office  [phone]  CC: Primary care physician; [name], MD  Note: This dictation was prepared with Dragon dictation along with smaller phrase technology. Any transcriptional errors that result from this process are unintentional.

## 2018-01-22 NOTE — Progress Notes (Signed)
[facility], [place] 01/22/18  Subjective:  Patient undergoing PermCath placement today. Thereafter he will have his first dialysis treatment. Renal function remains low with an EGFR of 12.  Objective:  Vital signs in last 24 hours:  Temp:  [97.6 F (36.4 C)-98 F (36.7 C)] 97.7 F (36.5 C) (10/02 0817) Pulse Rate:  [73-76] 73 (10/02 0817) Resp:  [16-18] 17 (10/02 0817) BP: (111-135)/(53-75) 135/58 (10/02 0817) SpO2:  [98 %-100 %] 100 % (10/02 0817)  Weight change:  Filed Weights   01/13/18 1327  Weight: 67.5 kg    Intake/Output:    Intake/Output Summary (Last 24 hours) at 01/22/2018 1033 Last data filed at 01/22/2018 0647 Gross per 24 hour  Intake 1947.21 ml  Output -  Net 1947.21 ml     Physical Exam: General:  Chronically ill-appearing, laying in the bed  HEENT  moist oral mucous membranes  Neck  supple  Pulm/lungs  diminished at bases, normal effort  CVS/Heart  irregular  Abdomen:   Soft, nondistended, BS present  Extremities:  + dependent peripheral edema  Neurologic:  Alert, follows commands    Basic Metabolic Panel:  Recent Labs  Lab 01/16/18 0513  01/18/18 0818 01/18/18 0820 01/19/18 0555 01/20/18 0441 01/21/18 0834 01/22/18 0341  NA 140   < >  --  144 140 140 140 140  K 4.8   < >  --  4.6 4.3 4.5 4.4 4.7  CL 108   < >  --  113* 113* 113* 113* 115*  CO2 21*   < >  --  21* 20* 19* 18* 18*  GLUCOSE 150*   < >  --  107* 110* 126* 86 136*  BUN 84*   < >  --  79* 74* 73* 68* 73*  CREATININE 4.78*   < >  --  5.43* 5.34* 4.94* 4.56* 4.34*  CALCIUM 7.0*   < >  --  7.9* 7.5* 7.6* 7.8* 7.8*  MG 1.9  --  2.0  --   --   --   --   --   PHOS  --   --  5.9*  --   --   --   --   --    < > = values in this interval not displayed.     CBC: Recent Labs  Lab 01/16/18 0513 01/18/18 0818 01/19/18 0555 01/20/18 0441 01/22/18 0341  WBC 7.9 7.6 7.5 8.3 8.2  NEUTROABS 5.7  --   --   --   --   HGB 9.0* 9.4* 7.8* 7.7* 7.4*  HCT  25.7* 27.2* 22.4* 22.6* 22.1*  MCV 92.8 93.7 94.1 93.9 96.9  PLT 241 205 176 187 191      Lab Results  Component Value Date   HEPBSAG Negative 01/18/2018   HEPBIGM Negative 01/18/2018      Microbiology:  Recent Results (from the past 240 hour(s))  Blood culture (routine x 2)     Status: None   Collection Time: 01/13/18  1:39 PM  Result Value Ref Range Status   Specimen Description RIGHT ANTECUBITAL  Final   Special Requests   Final    BOTTLES DRAWN AEROBIC AND ANAEROBIC Blood Culture adequate volume   Culture   Final    NO GROWTH 5 DAYS Performed at [facility], [address], [place], [place] [address]    Report Status 01/18/2018 FINAL  Final  Blood culture (routine x 2)     Status: None  Collection Time: 01/13/18  1:39 PM  Result Value Ref Range Status   Specimen Description LEFT ANTECUBITAL  Final   Special Requests   Final    BOTTLES DRAWN AEROBIC AND ANAEROBIC Blood Culture results may not be optimal due to an excessive volume of blood received in culture bottles   Culture   Final    NO GROWTH 5 DAYS Performed at [facility], [address] [place], [place] [address]    Report Status 01/18/2018 FINAL  Final  Urine Culture     Status: Abnormal   Collection Time: 01/13/18  1:39 PM  Result Value Ref Range Status   Specimen Description   Final    URINE, RANDOM Performed at [facility], [address] [place], [place] [address]    Special Requests   Final    NONE Performed at [facility], [place], [address] [place], [place] [address]    Culture >=100,000 COLONIES/mL PSEUDOMONAS AERUGINOSA (A)  Final   Report Status 01/16/2018 FINAL  Final   Organism ID, Bacteria PSEUDOMONAS AERUGINOSA (A)  Final      Susceptibility   Pseudomonas aeruginosa - MIC*    CEFTAZIDIME 4 SENSITIVE Sensitive     CIPROFLOXACIN <=0.25 SENSITIVE Sensitive     GENTAMICIN <=1 SENSITIVE Sensitive     IMIPENEM 1 SENSITIVE Sensitive     PIP/TAZO  <=4 SENSITIVE Sensitive     CEFEPIME 2 SENSITIVE Sensitive     * >=100,000 COLONIES/mL PSEUDOMONAS AERUGINOSA  MRSA PCR Screening     Status: None   Collection Time: 01/13/18  5:13 PM  Result Value Ref Range Status   MRSA by PCR NEGATIVE NEGATIVE Final    Comment:        The GeneXpert MRSA Assay (FDA approved for NASAL specimens only), is one component of a comprehensive MRSA colonization surveillance program. It is not intended to diagnose MRSA infection nor to guide or monitor treatment for MRSA infections. Performed at [facility], [address] [place], [place] [address]     Coagulation Studies: No results for input(s): LABPROT, INR in the last 72 hours.  Urinalysis: No results for input(s): COLORURINE, LABSPEC, PHURINE, GLUCOSEU, HGBUR, BILIRUBINUR, KETONESUR, PROTEINUR, UROBILINOGEN, NITRITE, LEUKOCYTESUR in the last 72 hours.  Invalid input(s): APPERANCEUR    Imaging: No results found.   Medications:   . sodium chloride 50 mL/hr at 01/22/18 0647  . sodium chloride    . ceFAZolin    .  ceFAZolin (ANCEF) IV    . heparin Stopped (01/22/18 0825)   . [MAR Hold] allopurinol  100 mg Oral Daily  . [MAR Hold] insulin aspart  0-9 Units Subcutaneous TID WC  . [MAR Hold] levothyroxine  50 mcg Oral QAC breakfast  . [MAR Hold] midodrine  10 mg Oral TID WC  . [MAR Hold] multivitamin-lutein  1 capsule Oral Daily  . [MAR Hold] OLANZapine  2.5 mg Oral QHS  . [MAR Hold] protein supplement shake  11 oz Oral BID BM  . [MAR Hold] senna-docusate  2 tablet Oral BID  . [MAR Hold] sodium bicarbonate  650 mg Oral BID  . [MAR Hold] tuberculin  5 Units Intradermal Once  . [MAR Hold] vitamin C  500 mg Oral BID   [DISCONTINUED] ondansetron **OR** [MAR Hold] ondansetron (ZOFRAN) IV, [MAR Hold] polyethylene glycol  Assessment/ Plan:  77 y.o. Caucasian male with medical problems of chronic systolic congestive heart failure, diabetes type 2, atrial fibrillation,  hypertension, history of gastric ulcer, coronary disease,  who was admitted to [facility] on 01/13/2018 for evaluation of altered mental status.   1.  Acute renal failure on chronic kidney disease stage III Baseline creatinine 1.30/GFR 51 from December 20, 2017 Admission creatinine 3.86, still critically high at 4.5 The cause of acute renal failure appears to be multifactorial including hypotension, sepsis, likely leading to ATN. Renal u/s shows left non obstructing stone 5 mm; no hydronephrosis  -Renal function remains quite low.  Creatinine currently 4.34 with an EGFR of 12.  PermCath being placed today.  Thereafter we will plan for his first dialysis treatment.  Further plan as patient progresses.  2.  UTI and sepsis, Pseudomonas aeuroginosa Treated with cefepime.   3. Hyperkalemia -Potassium remains acceptable range at 4.7.  Continue to monitor.  4.  Metabolic acidosis.  Secondary to acute renal failure.  This should improve with dialysis treatments.     LOS: 9 Skar[place]ico 10/2/201910:33 AM  [place], [place]  Note: This note was prepared with Dragon dictation. Any transcription errors are unintentional

## 2018-01-23 ENCOUNTER — Ambulatory Visit: Admission: RE | Admit: 2018-01-23 | Payer: Medicare Other | Source: Ambulatory Visit | Admitting: Vascular Surgery

## 2018-01-23 ENCOUNTER — Inpatient Hospital Stay
Admission: EM | Disposition: A | Discharge status: Skilled Nursing Facility | Payer: Self-pay | Source: Home / Self Care | Attending: Family Medicine

## 2018-01-23 ENCOUNTER — Encounter: Payer: Self-pay | Admitting: Vascular Surgery

## 2018-01-23 DIAGNOSIS — L97519 Non-pressure chronic ulcer of other part of right foot with unspecified severity: Secondary | ICD-10-CM

## 2018-01-23 DIAGNOSIS — I70235 Atherosclerosis of native arteries of right leg with ulceration of other part of foot: Secondary | ICD-10-CM

## 2018-01-23 HISTORY — PX: LOWER EXTREMITY ANGIOGRAPHY: CATH118251

## 2018-01-23 LAB — TYPE AND SCREEN
ABO/RH(D): O POS
Antibody Screen: NEGATIVE
Unit division: 0

## 2018-01-23 LAB — ALKALINE PHOSPHATASE: ALK PHOS: 194 U/L — AB (ref 38–126)

## 2018-01-23 LAB — BASIC METABOLIC PANEL
Anion gap: 6 (ref 5–15)
BUN: 61 mg/dL — ABNORMAL HIGH (ref 8–23)
CO2: 19 mmol/L — ABNORMAL LOW (ref 22–32)
CREATININE: 3.63 mg/dL — AB (ref 0.61–1.24)
Calcium: 7.9 mg/dL — ABNORMAL LOW (ref 8.9–10.3)
Chloride: 115 mmol/L — ABNORMAL HIGH (ref 98–111)
GFR calc Af Amer: 17 mL/min — ABNORMAL LOW (ref >60)
GFR, EST NON AFRICAN AMERICAN: 15 mL/min — AB (ref >60)
Glucose, Bld: 206 mg/dL — ABNORMAL HIGH (ref 70–99)
Potassium: 4.2 mmol/L (ref 3.5–5.1)
SODIUM: 140 mmol/L (ref 135–145)

## 2018-01-23 LAB — QUANTIFERON-TB GOLD PLUS (RQFGPL)
QUANTIFERON MITOGEN VALUE: 0.26 [IU]/mL
QuantiFERON Nil Value: 0.03 IU/mL
QuantiFERON TB1 Ag Value: 1.41 IU/mL
QuantiFERON TB2 Ag Value: 2.96 IU/mL

## 2018-01-23 LAB — CBC
HCT: 22.6 % — ABNORMAL LOW (ref 40.0–52.0)
Hemoglobin: 7.8 g/dL — ABNORMAL LOW (ref 13.0–18.0)
MCH: 32.3 pg (ref 26.0–34.0)
MCHC: 34.4 g/dL (ref 32.0–36.0)
MCV: 94 fL (ref 80.0–100.0)
PLATELETS: 162 10*3/uL (ref 150–440)
RBC: 2.4 MIL/uL — ABNORMAL LOW (ref 4.40–5.90)
RDW: 16.4 % — ABNORMAL HIGH (ref 11.5–14.5)
WBC: 7.3 10*3/uL (ref 3.8–10.6)

## 2018-01-23 LAB — ALT: ALT: 127 U/L — AB (ref 0–44)

## 2018-01-23 LAB — BPAM RBC
BLOOD PRODUCT EXPIRATION DATE: 201910232359
ISSUE DATE / TIME: 201910021701
Unit Type and Rh: 5100

## 2018-01-23 LAB — PHOSPHORUS
PHOSPHORUS: 4.4 mg/dL (ref 2.5–4.6)
Phosphorus: 4.2 mg/dL (ref 2.5–4.6)

## 2018-01-23 LAB — AST: AST: 86 U/L — AB (ref 15–41)

## 2018-01-23 LAB — QUANTIFERON-TB GOLD PLUS: QUANTIFERON-TB GOLD PLUS: POSITIVE — AB

## 2018-01-23 LAB — GLUCOSE, CAPILLARY
GLUCOSE-CAPILLARY: 152 mg/dL — AB (ref 70–99)
GLUCOSE-CAPILLARY: 85 mg/dL (ref 70–99)
GLUCOSE-CAPILLARY: 105 mg/dL — AB (ref 70–99)
Glucose-Capillary: 121 mg/dL — ABNORMAL HIGH (ref 70–99)
Glucose-Capillary: 92 mg/dL (ref 70–99)

## 2018-01-23 LAB — BILIRUBIN, TOTAL: Total Bilirubin: 0.5 mg/dL (ref 0.3–1.2)

## 2018-01-23 SURGERY — LOWER EXTREMITY ANGIOGRAPHY
Anesthesia: Moderate Sedation | Site: Leg Lower | Laterality: Right

## 2018-01-23 MED ORDER — FENTANYL CITRATE (PF) 100 MCG/2ML IJ SOLN: 12.5000 ug | Freq: Once | INTRAMUSCULAR | Status: DC | PRN

## 2018-01-23 MED ORDER — POVIDONE-IODINE 10 % EX SOLN
Freq: Two times a day (BID) | CUTANEOUS | Status: DC
  Administered 2018-01-23 – 2018-01-30 (×14): via TOPICAL
  Filled 2018-01-23: qty 118

## 2018-01-23 MED ORDER — MIDAZOLAM HCL 5 MG/5ML IJ SOLN
INTRAMUSCULAR | Status: AC
  Filled 2018-01-23: qty 5

## 2018-01-23 MED ORDER — IOPAMIDOL (ISOVUE-300) INJECTION 61%
INTRAVENOUS | Status: DC | PRN
  Administered 2018-01-23: 85 mL via INTRA_ARTERIAL

## 2018-01-23 MED ORDER — ONDANSETRON HCL 4 MG/2ML IJ SOLN: 4.0000 mg | Freq: Four times a day (QID) | INTRAMUSCULAR | Status: DC | PRN

## 2018-01-23 MED ORDER — MIDAZOLAM HCL 2 MG/2ML IJ SOLN
INTRAMUSCULAR | Status: DC | PRN
  Administered 2018-01-23: 2 mg via INTRAVENOUS

## 2018-01-23 MED ORDER — SODIUM CHLORIDE 0.9 % IV SOLN: INTRAVENOUS | Status: DC

## 2018-01-23 MED ORDER — SODIUM CHLORIDE FLUSH 0.9 % IV SOLN
INTRAVENOUS | Status: AC
  Filled 2018-01-23: qty 20

## 2018-01-23 MED ORDER — FENTANYL CITRATE (PF) 100 MCG/2ML IJ SOLN
INTRAMUSCULAR | Status: DC | PRN
  Administered 2018-01-23: 50 ug via INTRAVENOUS

## 2018-01-23 MED ORDER — FENTANYL CITRATE (PF) 100 MCG/2ML IJ SOLN
INTRAMUSCULAR | Status: AC
  Filled 2018-01-23: qty 2

## 2018-01-23 MED ORDER — CEFAZOLIN SODIUM-DEXTROSE 2-4 GM/100ML-% IV SOLN
2.0000 g | INTRAVENOUS | Status: AC
  Administered 2018-01-23: 2 g via INTRAVENOUS
  Filled 2018-01-23: qty 100

## 2018-01-23 MED ORDER — HEPARIN (PORCINE) IN NACL 1000-0.9 UT/500ML-% IV SOLN
INTRAVENOUS | Status: AC
  Filled 2018-01-23: qty 1000

## 2018-01-23 MED ORDER — HEPARIN SODIUM (PORCINE) 1000 UNIT/ML IJ SOLN
INTRAMUSCULAR | Status: AC
  Filled 2018-01-23: qty 1

## 2018-01-23 MED ORDER — LIDOCAINE-EPINEPHRINE (PF) 1 %-1:200000 IJ SOLN
INTRAMUSCULAR | Status: AC
  Filled 2018-01-23: qty 30

## 2018-01-23 MED ORDER — ATORVASTATIN CALCIUM 10 MG PO TABS
10.0000 mg | ORAL_TABLET | Freq: Every day | ORAL | Status: DC
  Administered 2018-01-24 – 2018-01-30 (×7): 10 mg via ORAL
  Filled 2018-01-23 (×7): qty 1

## 2018-01-23 MED ORDER — HEPARIN SODIUM (PORCINE) 1000 UNIT/ML IJ SOLN
INTRAMUSCULAR | Status: DC | PRN
  Administered 2018-01-23: 4000 [IU] via INTRAVENOUS

## 2018-01-23 SURGICAL SUPPLY — 16 items
BALLN ULTRVRSE 2.5X300X150 (BALLOONS) ×6
BALLN ULTRVRSE 3X100X130C (BALLOONS) ×3
CATH BEACON 5 .038 100 VERT TP (CATHETERS) ×3
CATH CXI SUPP ANG 4FR 135CM (CATHETERS) ×3
CATH PIG 70CM (CATHETERS) ×3
CATH SEEKER .035X135CM (CATHETERS) ×6
DEVICE PRESTO INFLATION (MISCELLANEOUS) ×3
DEVICE STARCLOSE SE CLOSURE (Vascular Products) ×3 IMPLANT
GLIDEWIRE ADV .035X260CM (WIRE) ×3
PACK ANGIOGRAPHY (CUSTOM PROCEDURE TRAY) ×3
SHEATH BRITE TIP 5FRX11 (SHEATH) ×3
SHEATH RAABE 6FRX70 (SHEATH) ×3
SYR MEDRAD MARK V 150ML (SYRINGE) ×3
TUBING CONTRAST HIGH PRESS 72 (TUBING) ×3
WIRE G V18X300CM (WIRE) ×6
WIRE J 3MM .035X145CM (WIRE) ×3

## 2018-01-23 NOTE — Progress Notes (Signed)
ID Was asked to see this patient for positive quantiferon gold this evening by Dr.Salary Not sure why it was ordered? Pt in hospital since 9/23 Has CKD/ESRD -will be starting dialysis Acute septic shock due to pseudomonas UTI resolved Acute transaminitis improving Prostate ca CXR    Unable to give any recommendation /suggestion regarding quantiferon gold as I dont know why it was ordered and what its significance is? He Has a normal CXR with no evidence of pulmonary TB Abnormal LFTS is contraindication for any  Treatment I will not be able to see the patient tomorrow as I am away. Hospitalist discuss this case with me on Saturday.

## 2018-01-23 NOTE — Op Note (Signed)
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  Ultrasound guidance for vascular access left femoral artery             2.  Catheter placement into right SFA from left femoral approach             3.  Aortogram and selective right lower extremity angiogram             4.  Percutaneous transluminal angioplasty of right anterior tibial artery with 2.5 mm diameter by 30 cm length angioplasty balloon             5. StarClose closure device left femoral artery  EBL: 10 cc  Contrast: 85 cc  Fluoro Time: 9.7 minutes  Moderate Conscious Sedation Time: approximately 45 minutes using 2 mg of Versed and 50 Mcg of Fentanyl              Indications:  Patient is a 77 y.o.male with nonhealing ulcerations on both feet and poorly palpable pedal pulses. The patient is brought in for angiography for further evaluation and potential treatment.  Due to the limb threatening nature of the situation, angiogram was performed for attempted limb salvage. The patient is aware that if the procedure fails, amputation would be expected.  The patient also understands that even with successful revascularization, amputation may still be required due to the severity of the situation.  Risks and benefits are discussed and informed consent is obtained.   Procedure:  The patient was identified and appropriate procedural time out was performed.  The patient was then placed supine on the table and prepped and draped in the usual sterile fashion. Moderate conscious sedation was administered during a face to face encounter with the patient throughout the procedure with my supervision of the RN administering medicines and monitoring the patient's vital signs, pulse oximetry,  telemetry and mental status throughout from the start of the procedure until the patient was taken to the recovery room. Ultrasound was used to evaluate the left common femoral artery.  It was patent .  A digital ultrasound image was acquired.  A Seldinger needle was used to access the left common femoral artery under direct ultrasound guidance and a permanent image was performed.  A 0.035 J wire was advanced without resistance and a 5Fr sheath was placed.  Pigtail catheter was placed into the aorta and an AP aortogram was performed. This demonstrated normal renal arteries and normal aorta and iliac segments without significant stenosis. I then crossed the aortic bifurcation and advanced to the right femoral head.  Due to his very slow circulation time presumably from cardiac disease, the catheter had to be advanced to the proximal to mid superficial femoral artery to opacify distally and evaluate the tibial vessels.  Selective right lower extremity angiogram was then performed. This demonstrated calcified common femoral artery, profunda femoris artery, superficial femoral artery, and popliteal artery without greater than 50% stenosis.  There is a small peroneal artery which was continuous and provided collateral blood flow into the foot.  The anterior tibial artery had a long occlusion from just beyond its origin down to the foot but had the best reconstitution.  The posterior tibial artery also was occluded from its origin to the  foot with a very small diseased vessel residual. It was felt that it was in the patient's best interest to proceed with intervention after these images to avoid a second procedure and a larger amount of contrast and fluoroscopy based off of the findings from the initial angiogram. The patient was systemically heparinized and a 6 French 30 cm sheath was then placed over the Terumo Advantage wire. I then used a Kumpe catheter and the advantage wire to navigate down into the anterior tibial  artery.  I then exchanged for a CXI catheter.  With a significant amount of difficulty, I was able to cross the long segment occlusion in the anterior tibial artery eventually with a seeker catheter and a V 18 wire confirming intraluminal flow just above the ankle.  The wire was then parked in the foot.  A 2.5 mm diameter by 30 cm length angioplasty balloon was inflated twice to encompass the entire anterior tibial artery from the foot up beyond its origin.  The first inflation was to 12 atm distally and the second inflation was to 14 atm proximally.  A 3 mm balloon was also used proximally inflated to 8 atm for 1 minute.  At this point, there is still several areas of greater than 50% stenosis but the anterior tibial artery did demonstrate in-line flow to the foot.  There is also likely some degree of spasm with the wire and the vessel distally.  I felt we had done all we could do to improve his perfusion today and he also had the peroneal artery providing a second runoff vessel. I elected to terminate the procedure. The sheath was removed and StarClose closure device was deployed in the left femoral artery with excellent hemostatic result. The patient was taken to the recovery room in stable condition having tolerated the procedure well.  Findings:               Aortogram:  Normal renal arteries, aorta and iliac arteries which are tortuous but not stenotic.             Right lower Extremity:  Diffusely calcified common femoral artery, profunda femoris artery, superficial femoral artery, and popliteal artery without greater than 50% stenosis.  There is a small peroneal artery which was continuous and provided collateral blood flow into the foot.  The anterior tibial artery had a long occlusion from just beyond its origin down to the foot but had the best reconstitution.  The posterior tibial artery also was occluded from its origin to the foot with a very small diseased vessel residual   Disposition: Patient  was taken to the recovery room in stable condition having tolerated the procedure well.  Complications: None  Leotis Pain 01/23/2018 4:52 PM   This note was created with Dragon Medical transcription system. Any errors in dictation are purely unintentional.

## 2018-01-23 NOTE — Progress Notes (Signed)
Patient ID: Gregory Tyler, male   DOB: December 11, 1940, 77 y.o.   MRN: [id number]  This NP visited patient at the bedside as a follow for palliative medicine needs and emotional support.  Patient remains open to all offered and available medical interventions to prolong life.  He has moved forward with catheter placement for HD and will continue as OP.  He is very anxious to transition back to his AL, he plans to f/u with his providers as an OP.   Discussed with patient the importance of continued conversation with his family and his   medical providers regarding overall plan of care and treatment options,  ensuring decisions are within the context of the patients values and GOCs.  Questions and concerns addressed   Recommend community based palliative services on discharge    Discussed with Dr  Posey Pronto     PMT will sign off at this time  Total time spent on the unit was 15 minutes  Greater than 50% of the time was spent in counseling and coordination of care  Wadie Lessen NP  Palliative Medicine Team Team Phone # [phone] Pager [phone]

## 2018-01-23 NOTE — Progress Notes (Signed)
[facility], [place] 01/23/18  Subjective:  Patient due for second eldest treatmenttoday.   appreciate vascular surgery assistance. Right internal jugular PermCath has been placed.   Objective:  Vital signs in last 24 hours:  Temp:  [97.7 F (36.5 C)-98.5 F (36.9 C)] 98.1 F (36.7 C) (10/03 0414) Pulse Rate:  [61-85] 74 (10/03 1254) Resp:  [13-20] 17 (10/03 1254) BP: (109-142)/(46-112) 121/56 (10/03 1254) SpO2:  [98 %-100 %] 98 % (10/03 1254) Weight:  [81.7 kg] 81.7 kg (10/03 0130)  Weight change:  Filed Weights   01/13/18 1327 01/22/18 2300 01/23/18 0130  Weight: 67.5 kg 81.7 kg 81.7 kg    Intake/Output:    Intake/Output Summary (Last 24 hours) at 01/23/2018 1457 Last data filed at 01/23/2018 0946 Gross per 24 hour  Intake 1815.4 ml  Output 277 ml  Net 1538.4 ml     Physical Exam: General:  Chronically ill-appearing, laying in the bed  HEENT  moist oral mucous membranes  Neck  supple  Pulm/lungs  diminished at bases, normal effort  CVS/Heart  irregular  Abdomen:   Soft, nondistended, BS present  Extremities:  + dependent peripheral edema  Neurologic:  Alert, follows commands    Basic Metabolic Panel:  Recent Labs  Lab 01/18/18 0818  01/19/18 0555 01/20/18 0441 01/21/18 0834 01/22/18 0341 01/22/18 2348 01/23/18 0447  NA  --    < > 140 140 140 140  --  140  K  --    < > 4.3 4.5 4.4 4.7  --  4.2  CL  --    < > 113* 113* 113* 115*  --  115*  CO2  --    < > 20* 19* 18* 18*  --  19*  GLUCOSE  --    < > 110* 126* 86 136*  --  206*  BUN  --    < > 74* 73* 68* 73*  --  61*  CREATININE  --    < > 5.34* 4.94* 4.56* 4.34*  --  3.63*  CALCIUM  --    < > 7.5* 7.6* 7.8* 7.8*  --  7.9*  MG 2.0  --   --   --   --   --   --   --   PHOS 5.9*  --   --   --   --   --  4.4  --    < > = values in this interval not displayed.     CBC: Recent Labs  Lab 01/18/18 0818 01/19/18 0555 01/20/18 0441 01/22/18 0341 01/23/18 0447  WBC 7.6  7.5 8.3 8.2 7.3  HGB 9.4* 7.8* 7.7* 7.4* 7.8*  HCT 27.2* 22.4* 22.6* 22.1* 22.6*  MCV 93.7 94.1 93.9 96.9 94.0  PLT 205 176 187 191 162      Lab Results  Component Value Date   HEPBSAG Negative 01/18/2018   HEPBIGM Negative 01/18/2018      Microbiology:  Recent Results (from the past 240 hour(s))  MRSA PCR Screening     Status: None   Collection Time: 01/13/18  5:13 PM  Result Value Ref Range Status   MRSA by PCR NEGATIVE NEGATIVE Final    Comment:        The GeneXpert MRSA Assay (FDA approved for NASAL specimens only), is one component of a comprehensive MRSA colonization surveillance program. It is not intended to diagnose MRSA infection nor to guide or monitor treatment for MRSA infections. Performed at [facility]  Lab, [place], [place] [address]     Coagulation Studies: No results for input(s): LABPROT, INR in the last 72 hours.  Urinalysis: No results for input(s): COLORURINE, LABSPEC, PHURINE, GLUCOSEU, HGBUR, BILIRUBINUR, KETONESUR, PROTEINUR, UROBILINOGEN, NITRITE, LEUKOCYTESUR in the last 72 hours.  Invalid input(s): APPERANCEUR    Imaging: No results found.   Medications:   . sodium chloride    . anticoagulant sodium citrate    .  ceFAZolin (ANCEF) IV     . allopurinol  100 mg Oral Daily  . apixaban  2.5 mg Oral BID  . insulin aspart  0-9 Units Subcutaneous TID WC  . levothyroxine  50 mcg Oral QAC breakfast  . midodrine  10 mg Oral TID WC  . multivitamin-lutein  1 capsule Oral Daily  . OLANZapine  2.5 mg Oral QHS  . povidone-iodine   Topical BID  . protein supplement shake  11 oz Oral BID BM  . senna-docusate  2 tablet Oral BID  . sodium bicarbonate  1,300 mg Oral BID  . tuberculin  5 Units Intradermal Once  . vitamin C  500 mg Oral BID   fentaNYL (SUBLIMAZE) injection, [DISCONTINUED] ondansetron **OR** ondansetron (ZOFRAN) IV, ondansetron (ZOFRAN) IV, polyethylene glycol  Assessment/ Plan:  77 y.o. Caucasian  male with medical problems of chronic systolic congestive heart failure, diabetes type 2, atrial fibrillation, hypertension, history of gastric ulcer, coronary disease, who was admitted to [facility] on 01/13/2018 for evaluation of altered mental status.   1.  Acute renal failure on chronic kidney disease stage III Baseline creatinine 1.30/GFR 51 from December 20, 2017 Admission creatinine 3.86, still critically high at 4.5 The cause of acute renal failure appears to be multifactorial including hypotension, sepsis, likely leading to ATN. Renal u/s shows left non obstructing stone 5 mm; no hydronephrosis  -patient completed first else's treatment.  Next also treatment will be today.  His third also treatment will be tomorrow.  Outpatient dialysis Center placement pending.  2.  UTI and sepsis, Pseudomonas aeuroginosa Treated with cefepime.   3. Hyperkalemia -serum potassium has been corrected and is currently 4.2.    4.  Metabolic acidosis.  Secondary to acute renal failure.  Serum bicarbonate currently 19 and should continue to improve with dialysis.    LOS: 10 Gregory Tyler 10/3/20192:57 PM  [place], [place]  Note: This note was prepared with Dragon dictation. Any transcription errors are unintentional

## 2018-01-23 NOTE — Progress Notes (Signed)
[place] at [place] NAME: Gregory Tyler    MR#:  [id number]  DATE OF BIRTH:  July 20, 1940  SUBJECTIVE:   Patient feels hungry. He had his first dialysis treatment yesterday. Perm cath placed yesterday. Tolerating treatment well. REVIEW OF SYSTEMS:   Review of Systems  Constitutional: Negative for chills, fever and weight loss.  HENT: Negative for ear discharge, ear pain and nosebleeds.   Eyes: Negative for blurred vision, pain and discharge.  Respiratory: Negative for sputum production, shortness of breath, wheezing and stridor.   Cardiovascular: Negative for chest pain, palpitations, orthopnea and PND.  Gastrointestinal: Negative for abdominal pain, diarrhea, nausea and vomiting.  Genitourinary: Negative for frequency and urgency.  Musculoskeletal: Negative for back pain and joint pain.  Neurological: Positive for weakness. Negative for sensory change, speech change and focal weakness.  Psychiatric/Behavioral: Negative for depression and hallucinations. The patient is not nervous/anxious.    Tolerating Diet:yes Tolerating PT:   DRUG ALLERGIES:   Allergies  Allergen Reactions  . Aspirin     GI Bleeding  . Codeine Other (See Comments)    BLISTERS Other reaction(s): Other (See Comments) BLISTERS   . Codeine Sulfate Other (See Comments)    Other reaction(s): Other (See Comments) BLISTERS BLISTERS    VITALS:  Blood pressure (!) 131/112, pulse 81, temperature 98.1 F (36.7 C), temperature source Oral, resp. rate 20, height 5\' 8"  (1.727 m), weight 81.7 kg, SpO2 98 %.  PHYSICAL EXAMINATION:   Physical Exam  GENERAL:  77 y.o.-year-old patient lying in the bed with no acute distress.  EYES: Pupils equal, round, reactive to light and accommodation. No scleral icterus. Extraocular muscles intact. PALLOR+ HEENT: Head atraumatic, normocephalic. Oropharynx and nasopharynx clear.  NECK:  Supple, no jugular venous distention. No  thyroid enlargement, no tenderness.  LUNGS: Normal breath sounds bilaterally, no wheezing, rales, rhonchi. No use of accessory muscles of respiration.  CARDIOVASCULAR: S1, S2 normal. No murmurs, rubs, or gallops.  ABDOMEN: Soft, nontender, nondistended. Bowel sounds present. No organomegaly or mass.  EXTREMITIES: No cyanosis, clubbing or edema b/l.   Bilateral lower extremity wounds chronic. NEUROLOGIC: Cranial nerves II through XII are intact. No focal Motor or sensory deficits b/l. Feeling weak PSYCHIATRIC:  patient is alert and oriented x 3.  SKIN: No obvious rash, lesion, or ulcer.   LABORATORY PANEL:  CBC Recent Labs  Lab 01/23/18 0447  WBC 7.3  HGB 7.8*  HCT 22.6*  PLT 162    Chemistries  Recent Labs  Lab 01/18/18 0818  01/21/18 0834  01/23/18 0447  NA  --    < > 140   < > 140  K  --    < > 4.4   < > 4.2  CL  --    < > 113*   < > 115*  CO2  --    < > 18*   < > 19*  GLUCOSE  --    < > 86   < > 206*  BUN  --    < > 68*   < > 61*  CREATININE  --    < > 4.56*   < > 3.63*  CALCIUM  --    < > 7.8*   < > 7.9*  MG 2.0  --   --   --   --   AST  --    < > 115*  --   --   ALT  --    < >  212*  --   --   ALKPHOS  --    < > 251*  --   --   BILITOT  --    < > 0.5  --   --    < > = values in this interval not displayed.   Cardiac Enzymes No results for input(s): TROPONINI in the last 168 hours. RADIOLOGY:  No results found. 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 this hospital stay) Status post right IJ hemodialysis catheter placement by vascular surgery For hemodialysis Arrange for outpatient hemodialysis chair  *Acute encephalopathy due to sepsis Resolved  *Acute septic shock due to Pseudomonas UTI  Resolved Successfully weaned off pressors Treated with 6-day course of cefepime-discontinued on January 21, 2018  *Acute transaminitis  Resolving Suspected due to  sepsis/acute shock liver GI did see patient while in house-no intervention recommended, continue to avoid hepatotoxic agents, hepatic panel negative - abdominal ultrasound per radiology noted for fatty liver/hepatocellular disease    *Chronic systolic CHF without exacerbation repeat echo shows normal EF Stable on current regimen  *Chronic diabetes mellitus type 2 Stable on current regiment  *Hypertension  Normotensive without antihypertensive medications   *Paroxysmal atrial fibrillation  Discontinue heparin drip, started Eliquis   Discharge to skilled nursing facility after dialysis chair secured in outpatient setting  -pt is from [facility]. He is a long-term resident.   CODE STATUS: dnr   Case discussed with Care Management/Social Worker. Management plans discussed with the patient, family and they are in agreement.    DVT Prophylaxis: eliquis  TOTAL TIME TAKING CARE OF THIS PATIENT: *30* minutes.  >50% time spent on counselling and coordination of care  POSSIBLE D/C IN **1-2DAYS, DEPENDING ON CLINICAL CONDITION.  Note: This dictation was prepared with Dragon dictation along with smaller phrase technology. Any transcriptional errors that result from this process are unintentional.  Fritzi Mandes M.D on 01/23/2018 at 8:13 AM  Between 7am to 6pm - Pager - [phone]  After 6pm go to [email or web address] - password [email or web address] [place] [facility]  Office  [phone]  CC: Primary care physician; [name], MDPatient ID: Gregory Tyler, male   DOB: [birth year], 77 y.o.   MRN: [id number]

## 2018-01-23 NOTE — H&P (Signed)
[facility] History & Physical Update  The patient was interviewed and re-examined.  The patient's previous History and Physical has been reviewed and is unchanged.  There is no change in the plan of care. We plan to proceed with the scheduled procedure.  Leotis Pain, MD  01/23/2018, 3:37 PM

## 2018-01-23 NOTE — Progress Notes (Signed)
Post HD Treatment    01/23/18 0130  Vital Signs  Temp 97.9 F (36.6 C)  Temp Source Oral  Pulse Rate 67  Pulse Rate Source Monitor  Resp 13  BP 134/63  BP Location Right Arm  BP Method Automatic  Patient Position (if appropriate) Lying  Oxygen Therapy  SpO2 100 %  O2 Device Room Air  Pain Assessment  Pain Scale 0-10  Pain Score 0  Dialysis Weight  Weight 81.7 kg  Type of Weight Post-Dialysis  Post-Hemodialysis Assessment  Rinseback Volume (mL) 250 mL  Dialyzer Clearance Lightly streaked  Duration of HD Treatment -hour(s) 1.5 hour(s)  Hemodialysis Intake (mL) 500 mL  UF Total -Machine (mL) 527 mL  Net UF (mL) 27 mL  Tolerated HD Treatment Yes

## 2018-01-23 NOTE — Progress Notes (Signed)
HD Treatment Complete    01/23/18 0124  Vital Signs  Pulse Rate 62  Resp 13  Oxygen Therapy  SpO2 100 %  During Hemodialysis Assessment  Blood Flow Rate (mL/min) 150 mL/min  Arterial Pressure (mmHg) -110 mmHg  Venous Pressure (mmHg) 170 mmHg  Transmembrane Pressure (mmHg) 40 mmHg  Ultrafiltration Rate (mL/min) 310 mL/min  Dialysate Flow Rate (mL/min) 300 ml/min  Conductivity: Machine  13.9  HD Safety Checks Performed Yes  Intra-Hemodialysis Comments Tolerated well;Tx completed (UF 527)

## 2018-01-23 NOTE — Clinical Social Work Note (Signed)
Patient not yet ready for discharge. CSW updated Magda Paganini at [facility]. Shela Leff MSW,LCSW [phone]

## 2018-01-23 NOTE — Progress Notes (Signed)
Pre HD     01/22/18 2300  Vital Signs  Temp 97.9 F (36.6 C)  Temp Source Oral  Pulse Rate 67  Pulse Rate Source Monitor  Resp 18  BP 134/69  BP Location Right Arm  BP Method Automatic  Patient Position (if appropriate) Lying  Oxygen Therapy  SpO2 100 %  O2 Device Room Air  Pain Assessment  Pain Scale 0-10  Pain Score 0  Dialysis Weight  Weight 81.7 kg  Type of Weight Pre-Dialysis  Time-Out for Hemodialysis  What Procedure? HD  Pt Identifiers(min of two) First/Last Name;MRN/Account#;Pt's DOB(use if MRN/Acct# not available  Correct Site? Yes  Correct Side? Yes  Correct Procedure? Yes  Consents Verified? Yes  Rad Studies Available? N/A  Safety Precautions Reviewed? Yes  Engineer, civil (consulting) Number  (1A)  Station Number 4  UF/Alarm Test Passed  Conductivity: Meter 14  Conductivity: Machine  13.9  pH 7.4  Reverse Osmosis Main  Normal Saline Lot Number [id number]  Dialyzer Lot Number [id number]  Disposable Set Lot Number [id number]  Machine Temperature 98.6 F (37 C)  Musician and Audible Yes  Blood Lines Intact and Secured Yes  Pre Treatment Patient Checks  Vascular access used during treatment Catheter  Hepatitis B Surface Antigen Results  (Unknown)  Hepatitis B Surface Antibody  (Unknown)  Date Hepatitis B Surface Antibody Drawn 01/22/18  Hemodialysis Consent Verified Yes  Hemodialysis Standing Orders Initiated Yes  ECG (Telemetry) Monitor On Yes  Prime Ordered Normal Saline  Length of  DialysisTreatment -hour(s) 1.5 Hour(s)  Dialyzer Elisio 17H NR  Dialysate 2K, 2.5 Ca  Dialysis Anticoagulant Other (Comment)  Dialysate Flow Ordered 300  Blood Flow Rate Ordered 150 mL/min  Ultrafiltration Goal 330 Liters  Pre Treatment Labs Hepatitis B Surface Antigen;Phosphorus;Other (Comment)  Dialysis Blood Pressure Support Ordered Normal Saline  Education / Care Plan  Dialysis Education Provided Yes  Documented Education in Care Plan Yes  Note  Observations   (Right Chest [facility] Cath dressed with white occlusive gauze)

## 2018-01-23 NOTE — Consult Note (Signed)
[facility] Nurse wound consult note Reason for Consult:multiple wounds bilateral feet, healing wounds bilateral heels Wound type:vascular infarct/trauma, pressure Pressure Injury POA: Yes Measurement: right foot posterior heel has healing 0.5cm x 1cm black scabbed unstageable wound. Much smaller than on last admission. Right lateral ankle 1cm round black scabbed wound. Right lateral 5th toe has two wounds 1cm x 1cm and 2cm x 1.5cm black scabbed wound. Right great toe has 2cm x 1.5cm and 0.5cm x 1cm black scabbed wounds. Medial area of 2nd toe on right foot has 2cm x 2cm black scabbed wound. Left lateral side of foot has 1cm x 2cm black scabbed wound. All these wounds are full thickness. Left heel had pressure wound on last admit and is currently healing well.  Wound bed:all wounds with black scabbed beds. Drainage (amount, consistency, odor) none Periwound: intact Dressing procedure/placement/frequency: I have provided nurses with orders for Betadine application to bilateral foot wounds BID. Leave open to air. Keep foam on heels, float heels in air, pt prefers this to [employer]. Pressure relief chair cushion ordered. Pt to take back to SNF when he is discharged. Pt has other wounds that we were not consulted on, these are already being dressed by bedside RNs everyday. Pt's nutritional level is very poor, already has protein supplements ordered for optimal wound healing. We will not follow, but will remain available to this patient, to nursing, and the medical and/or surgical teams.  Please re-consult if we need to assist further.  Fara Olden, RN-C, WTA-C, [place] [facility] Ost[facility] Care Associate

## 2018-01-23 NOTE — Progress Notes (Signed)
01/23/2018 6:12 PM  Patient returned from special procedures.  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  Notified Dr. Jerelyn Charles who placed on airborne and contact precautions.  Pt will have to wait for pt in airborne room to move and room to be cleaned before can be moved.  Will continue to monitor and assess.  Dola Argyle, RN

## 2018-01-23 NOTE — Care Management (Signed)
First dialysis treatment yesterday.  [facility] HD liaison working on outpatient HD placement.  Quantiferon pending. Staff notified that patient will need to be able to tolerate HD sitting.

## 2018-01-23 NOTE — Progress Notes (Signed)
[facility]  Daily Progress Note    Permcath insertion by Dr. Delana Meyer on 01/23/18. If patient needs permanent access for dialysis please make an appointment with our office to undergo vein mapping as an outpatient. Vascular surgery will sign off at this point. Please reconsult if necessary.   Joelene Millin Aaliyana Fredericks PA-C 01/23/2018 1:20 PM

## 2018-01-23 NOTE — Progress Notes (Signed)
Post HD Assessment    01/23/18 0132  Neurological  Level of Consciousness Alert  Orientation Level Oriented X4  Respiratory  Respiratory Pattern Regular;Unlabored;Symmetrical  Chest Assessment Chest expansion symmetrical  Bilateral Breath Sounds Diminished  Cardiac  Pulse Regular  Heart Sounds S1, S2  Jugular Venous Distention (JVD) Yes  ECG Monitor Yes  Cardiac Rhythm NSR  Vascular  R Radial Pulse +2  L Radial Pulse +2  R Dorsalis Pedis Pulse  (UTA)  L Dorsalis Pedis Pulse  (UTA)  Integumentary  Integumentary (WDL) X  Skin Color Pale  Skin Condition Dry  Skin Integrity Abrasion;Ecchymosis;Excoriated (scratch marks);Surgical Incision (see LDA)  Ecchymosis Location Arm  Ecchymosis Location Orientation Bilateral  Musculoskeletal  Musculoskeletal (WDL) X  Generalized Weakness Yes  GU Assessment  Genitourinary (WDL)  (HD pt)  Psychosocial  Psychosocial (WDL) WDL

## 2018-01-23 NOTE — Progress Notes (Signed)
Pt was able to be fit in today for a procedure, pt unexpectedly in specials, unable to come down for dialysis at this time.    01/23/18 1558  Hand-Off documentation  Report given to (Full Name) Stark Bray  Report received from (Full Name) Barnabas Lister Dougherty,RN

## 2018-01-23 NOTE — Progress Notes (Signed)
HD Treatment Initiated    01/22/18 2336  Vital Signs  Pulse Rate 66  Pulse Rate Source Monitor  Resp 16  Oxygen Therapy  SpO2 100 %  O2 Device Room Air  During Hemodialysis Assessment  Blood Flow Rate (mL/min) 150 mL/min  Arterial Pressure (mmHg) -20 mmHg  Venous Pressure (mmHg) 60 mmHg  Transmembrane Pressure (mmHg) 40 mmHg  Ultrafiltration Rate (mL/min) 330 mL/min  Dialysate Flow Rate (mL/min) 300 ml/min  Conductivity: Machine  13.9  HD Safety Checks Performed Yes  Dialysis Fluid Bolus Normal Saline  Bolus Amount (mL) 250 mL  Intra-Hemodialysis Comments Tx initiated;Progressing as prescribed

## 2018-01-23 NOTE — Progress Notes (Signed)
Pre HD assessment   01/23/18 2145  Vital Signs  Temp 97.8 F (36.6 C)  Temp Source Oral  Pulse Rate 77  Pulse Rate Source Monitor  Resp 16  BP (!) 131/54  BP Location Left Arm  BP Method Automatic  Patient Position (if appropriate) Lying  Oxygen Therapy  SpO2 100 %  O2 Device Room Air  Pain Assessment  Pain Scale 0-10  Pain Score 0  Dialysis Weight  Weight 81.2 kg  Type of Weight Pre-Dialysis  Time-Out for Hemodialysis  What Procedure? HD  Pt Identifiers(min of two) First/Last Name;MRN/Account#  Correct Site? Yes  Correct Side? Yes  Correct Procedure? Yes  Consents Verified? Yes  Rad Studies Available? N/A  Safety Precautions Reviewed? Yes  Engineer, civil (consulting) Number  (7A)  Station Number  (bedside 228)  UF/Alarm Test Passed  Conductivity: Meter 14.6  Conductivity: Machine  14.8  pH 7.4  Reverse Osmosis  (WRO #1)  Normal Saline Lot Number [id number]  Dialyzer Lot Number [id number]  Disposable Set Lot Number [id number]  Machine Temperature 98.6 F (37 C)  Musician and Audible Yes  Blood Lines Intact and Secured Yes  Pre Treatment Patient Checks  Vascular access used during treatment Catheter  Hepatitis B Surface Antigen Results Negative  Date Hepatitis B Surface Antigen Drawn 01/18/18  Isolation Initiated Yes (TB positive, negative pressure room)  Hepatitis B Surface Antibody  (<10)  Date Hepatitis B Surface Antibody Drawn 01/18/18  Hemodialysis Consent Verified Yes  Hemodialysis Standing Orders Initiated Yes  ECG (Telemetry) Monitor On Yes  Prime Ordered Normal Saline  Length of  DialysisTreatment -hour(s) 2.5 Hour(s)  Dialyzer Elisio 17H NR  Dialysate 3K, 2.5 Ca  Dialysis Anticoagulant None  Dialysate Flow Ordered 500  Blood Flow Rate Ordered 250 mL/min  Ultrafiltration Goal 0 Liters  Pre Treatment Labs Phosphorus  Dialysis Blood Pressure Support Ordered Normal Saline  Education / Care Plan  Dialysis Education Provided Yes  Documented  Education in Care Plan Yes

## 2018-01-23 NOTE — Progress Notes (Signed)
OT Cancellation Note  Patient Details Name: Gregory Tyler MRN: [id number] DOB: 06/18/1940   Cancelled Treatment:    Reason Eval/Treat Not Completed: Patient declined, no reason specified;Fatigue/lethargy limiting ability to participate. Pt declining therapy this am 2/2 fatigue and upcoming dialysis and angiogram scheduled for today. Will re-attempt OT treatment at later date/time as pt is willing to participate and medically appropriate.   Jeni Salles, MPH, MS, OTR/L ascom [phone] 01/23/18, 11:59 AM

## 2018-01-23 NOTE — Progress Notes (Signed)
PT Cancellation Note  Patient Details Name: Gregory Tyler MRN: [id number] DOB: February 26, 1941   Cancelled Treatment:    Reason Eval/Treat Not Completed: Patient declined, no reason specified.  Upon PT arrival and therapist introducing herself to pt, pt immediately stating "bye" to therapist.  Pt then stating to therapist that he was too tired to participate in any therapy (mobility or in bed ex's) and still had dialysis and procedure today (nurse reports plan for angiogram today).  Will re-attempt PT treatment session at a later date/time.  Leitha Bleak, PT 01/23/18, 11:52 AM [phone]

## 2018-01-23 NOTE — Progress Notes (Signed)
Pre HD assessment    01/23/18 2146  Neurological  Level of Consciousness Alert  Orientation Level Oriented X4  Respiratory  Respiratory Pattern Regular;Unlabored  Chest Assessment Chest expansion symmetrical  Cardiac  ECG Monitor Yes  Cardiac Rhythm NSR  Vascular  R Radial Pulse +1  L Radial Pulse +1  Integumentary  Integumentary (WDL) X  Skin Color Appropriate for ethnicity  Musculoskeletal  Musculoskeletal (WDL) X  Generalized Weakness Yes  Assistive Device None  GU Assessment  Genitourinary (WDL) X  Genitourinary Symptoms  (HD)  Psychosocial  Psychosocial (WDL) WDL  Patient Behaviors Appropriate for age;Appropriate for situation;Cooperative;Calm  Needs Expressed Physical;Educational  Emotional support given Given to patient

## 2018-01-23 NOTE — Progress Notes (Signed)
HD tx start    01/23/18 2206  Vital Signs  Pulse Rate 87  Pulse Rate Source Monitor  Resp 18  BP 111/60  BP Location Left Arm  BP Method Automatic  Patient Position (if appropriate) Lying  Oxygen Therapy  SpO2 100 %  O2 Device Room Air  During Hemodialysis Assessment  Blood Flow Rate (mL/min) 250 mL/min  Arterial Pressure (mmHg) -110 mmHg  Venous Pressure (mmHg) 60 mmHg  Transmembrane Pressure (mmHg) 60 mmHg  Ultrafiltration Rate (mL/min) 200 mL/min  Dialysate Flow Rate (mL/min) 500 ml/min  Conductivity: Machine  14.2  HD Safety Checks Performed Yes  Dialysis Fluid Bolus Normal Saline  Bolus Amount (mL) 250 mL  Intra-Hemodialysis Comments Tx initiated

## 2018-01-23 NOTE — Progress Notes (Signed)
Patient not in room today.  Liver enzymes ordered to be added onto morning labs and are pending at this time.

## 2018-01-23 NOTE — Progress Notes (Signed)
Waiting to begin HD tx, per primary RN we are still waiting for pt to be transferred to negative pressure room r/t positive TB results. Will begin HD tx once pt has been transferred to his new room.    01/23/18 1935  Hand-Off documentation  Report given to (Full Name) Stark Bray  Report received from (Full Name) [facility]

## 2018-01-23 NOTE — Progress Notes (Signed)
01/23/2018 6:49 PM  Spoke with radiologist on call about pt's x-ray from 9/26.  They said it apears normal, but as they were from before the TB test, another chest x-ray was recommended.  Notified Dr. Jerelyn Charles, hospitalist on call, who declined to order x-ray at this time.  Dola Argyle, RN

## 2018-01-23 NOTE — Progress Notes (Signed)
Pre HD Assessment    01/22/18 2300  Neurological  Level of Consciousness Alert  Orientation Level Oriented X4  Respiratory  Respiratory Pattern Regular;Unlabored;Symmetrical  Chest Assessment Chest expansion symmetrical  Bilateral Breath Sounds Diminished  Cardiac  Pulse Regular  Heart Sounds S1, S2  Jugular Venous Distention (JVD) Yes  ECG Monitor Yes  Cardiac Rhythm NSR  Vascular  R Radial Pulse +2  L Radial Pulse +2  R Dorsalis Pedis Pulse  (UTA)  L Dorsalis Pedis Pulse  (UTA)  Integumentary  Integumentary (WDL) X  Skin Color Pale  Skin Condition Dry  Skin Integrity Abrasion;Ecchymosis;Excoriated (scratch marks);Surgical Incision (see LDA)  Ecchymosis Location Arm  Ecchymosis Location Orientation Bilateral  Musculoskeletal  Musculoskeletal (WDL) X  Generalized Weakness Yes  GU Assessment  Genitourinary (WDL)  (HD pt)  Psychosocial  Psychosocial (WDL) WDL

## 2018-01-24 ENCOUNTER — Ambulatory Visit: Payer: Medicare Other | Admitting: Physician Assistant

## 2018-01-24 ENCOUNTER — Encounter: Payer: Self-pay | Admitting: Vascular Surgery

## 2018-01-24 ENCOUNTER — Other Ambulatory Visit (INDEPENDENT_AMBULATORY_CARE_PROVIDER_SITE_OTHER): Payer: Self-pay | Admitting: Nurse Practitioner

## 2018-01-24 LAB — BASIC METABOLIC PANEL
ANION GAP: 8 (ref 5–15)
BUN: 59 mg/dL — AB (ref 8–23)
CHLORIDE: 114 mmol/L — AB (ref 98–111)
CO2: 18 mmol/L — ABNORMAL LOW (ref 22–32)
Calcium: 7.9 mg/dL — ABNORMAL LOW (ref 8.9–10.3)
Creatinine, Ser: 3.74 mg/dL — ABNORMAL HIGH (ref 0.61–1.24)
GFR calc Af Amer: 16 mL/min — ABNORMAL LOW (ref >60)
GFR, EST NON AFRICAN AMERICAN: 14 mL/min — AB (ref >60)
GLUCOSE: 91 mg/dL (ref 70–99)
POTASSIUM: 4 mmol/L (ref 3.5–5.1)
Sodium: 140 mmol/L (ref 135–145)

## 2018-01-24 LAB — HEPATITIS B SURFACE ANTIGEN: Hepatitis B Surface Ag: NEGATIVE

## 2018-01-24 LAB — GLUCOSE, CAPILLARY
Glucose-Capillary: 103 mg/dL — ABNORMAL HIGH (ref 70–99)
Glucose-Capillary: 150 mg/dL — ABNORMAL HIGH (ref 70–99)
Glucose-Capillary: 148 mg/dL — ABNORMAL HIGH (ref 70–99)

## 2018-01-24 LAB — PHOSPHORUS: Phosphorus: 2.9 mg/dL (ref 2.5–4.6)

## 2018-01-24 LAB — HEPATITIS B CORE ANTIBODY, TOTAL: HEP B C TOTAL AB: NEGATIVE

## 2018-01-24 LAB — HEPATITIS B SURFACE ANTIBODY,QUALITATIVE: Hep B S Ab: NONREACTIVE

## 2018-01-24 MED ORDER — OXYCODONE-ACETAMINOPHEN 5-325 MG PO TABS
1.0000 | ORAL_TABLET | Freq: Four times a day (QID) | ORAL | Status: DC | PRN
  Administered 2018-01-24 – 2018-01-30 (×6): 1 via ORAL
  Filled 2018-01-24 (×6): qty 1

## 2018-01-24 NOTE — Progress Notes (Signed)
Hd completed 

## 2018-01-24 NOTE — Care Management (Signed)
Per Elvera Bicker HD liaison patient outpatient HD schedule is as follows TTS 11:40 at [facility].  Patient can start as early as Tuesday 10/8.  Staff aware that patient will need to be able to tolerate HD sitting.

## 2018-01-24 NOTE — Progress Notes (Signed)
OT Cancellation Note  Patient Details Name: Gregory Tyler MRN: [id number] DOB: 13-Feb-1941   Cancelled Treatment:    Reason Eval/Treat Not Completed: Patient declined, no reason specified. Upon PT/OT arrival to pt's room, pt declining therapy.  Pt reporting that he did not get to bed until 3:30 this morning (night dialysis session noted in chart), was tired, and still had dialysis today.  Nurse notified.  Jeni Salles, MPH, MS, OTR/L ascom [phone] 01/24/18, 4:10 PM

## 2018-01-24 NOTE — Progress Notes (Signed)
Post HD assessment   01/24/18 0100  Neurological  Level of Consciousness Alert  Orientation Level Oriented X4  Respiratory  Respiratory Pattern Regular;Unlabored  Chest Assessment Chest expansion symmetrical  Cardiac  Pulse Irregular  ECG Monitor Yes  Vascular  R Radial Pulse +1  L Radial Pulse +1  Edema Generalized;Right upper extremity;Left upper extremity;Right lower extremity;Left lower extremity  Integumentary  Integumentary (WDL) X  Skin Color Appropriate for ethnicity  Musculoskeletal  Musculoskeletal (WDL) X  Generalized Weakness Yes  Assistive Device None  GU Assessment  Genitourinary (WDL) X  Genitourinary Symptoms  (HD)  Psychosocial  Psychosocial (WDL) WDL  Patient Behaviors Appropriate for age;Appropriate for situation;Cooperative;Calm  Needs Expressed Physical  Emotional support given Given to patient

## 2018-01-24 NOTE — Progress Notes (Signed)
Hd started  

## 2018-01-24 NOTE — Progress Notes (Signed)
   Vonda Antigua, MD [address], [place], [place], [place], [address] [address], [place], [place], [place], [address] Phone: [phone]  Fax: [phone]   Subjective: No abdominal pain. The patient denies abdominal or flank pain, anorexia, nausea or vomiting, dysphagia, change in bowel habits or black or bloody stools or weight loss.    Objective: Exam: Vital signs in last 24 hours: Vitals:   01/24/18 0044 01/24/18 0103 01/24/18 0436 01/24/18 1234  BP: 121/82 (!) 135/57 (!) 104/57 127/61  Pulse: 77 68 84 73  Resp: 16 16 20 18   Temp:  70.1 F (36.8 C) 98 F (36.7 C) 97.7 F (36.5 C)  TempSrc:  Oral Oral Oral  SpO2: 99% 99% 92% 99%  Weight:  81.6 kg    Height:       Weight change: 0 kg  Intake/Output Summary (Last 24 hours) at 01/24/2018 1537 Last data filed at 01/24/2018 0856 Gross per 24 hour  Intake -  Output 648 ml  Net -648 ml    General: No acute distress, AAO x3 Abd: Soft, NT/ND, No HSM Skin: Warm, no rashes Neck: Supple, Trachea midline   Lab Results: Lab Results  Component Value Date   WBC 7.3 01/23/2018   HGB 7.8 (L) 01/23/2018   HCT 22.6 (L) 01/23/2018   MCV 94.0 01/23/2018   PLT 162 01/23/2018   Micro Results: No results found for this or any previous visit (from the past 240 hour(s)). Studies/Results: No results found. Medications:  Scheduled Meds: . allopurinol  100 mg Oral Daily  . apixaban  2.5 mg Oral BID  . atorvastatin  10 mg Oral q1800  . insulin aspart  0-9 Units Subcutaneous TID WC  . levothyroxine  50 mcg Oral QAC breakfast  . midodrine  10 mg Oral TID WC  . multivitamin-lutein  1 capsule Oral Daily  . OLANZapine  2.5 mg Oral QHS  . povidone-iodine   Topical BID  . protein supplement shake  11 oz Oral BID BM  . senna-docusate  2 tablet Oral BID  . sodium bicarbonate  1,300 mg Oral BID  . tuberculin  5 Units Intradermal Once  . vitamin C  500 mg Oral BID   Continuous Infusions: PRN Meds:.[DISCONTINUED]  ondansetron **OR** ondansetron (ZOFRAN) IV, ondansetron (ZOFRAN) IV, oxyCODONE-acetaminophen, polyethylene glycol   Assessment: Active Problems:   Weakness generalized   Sepsis ([place])   Acute renal failure superimposed on chronic kidney disease ([place])   Palliative care by specialist   DNR (do not resuscitate)   Protein-calorie malnutrition, severe    Plan: Liver enzymes continue to improve Avoid hepatotoxic drugs Liver enzyme elevation and now improvement consistent with shock liver Continue daily CMP Follow up with GI clinic on discharge  GI service will sign off, please page with any questions.     LOS: 11 days   Vonda Antigua, MD 01/24/2018, 3:37 PM

## 2018-01-24 NOTE — Progress Notes (Signed)
[place], [place] 01/24/18  Subjective:  Patient seen at bedside. 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 Case discussed with infectious disease by hospitalist. Due for another dialysis treatment later today.   Objective:  Vital signs in last 24 hours:  Temp:  [97.6 F (36.4 C)-98.2 F (36.8 C)] 97.7 F (36.5 C) (10/04 1234) Pulse Rate:  [55-94] 73 (10/04 1234) Resp:  [11-22] 18 (10/04 1234) BP: (103-150)/(54-82) 127/61 (10/04 1234) SpO2:  [92 %-100 %] 99 % (10/04 1234) Weight:  [81.2 kg-81.7 kg] 81.6 kg (10/04 0103)  Weight change: 0 kg Filed Weights   01/23/18 1455 01/23/18 2145 01/24/18 0103  Weight: 81.7 kg 81.2 kg 81.6 kg    Intake/Output:    Intake/Output Summary (Last 24 hours) at 01/24/2018 1238 Last data filed at 01/24/2018 0856 Gross per 24 hour  Intake -  Output 648 ml  Net -648 ml     Physical Exam: General:  Chronically ill-appearing, laying in the bed  HEENT  moist oral mucous membranes  Neck  supple  Pulm/lungs  diminished at bases, normal effort  CVS/Heart  irregular  Abdomen:   Soft, nondistended, BS present  Extremities:  + dependent peripheral edema  Neurologic:  Alert, follows commands    Basic Metabolic Panel:  Recent Labs  Lab 01/18/18 0818  01/20/18 0441 01/21/18 0834 01/22/18 0341 01/22/18 2348 01/23/18 0447 01/23/18 1906 01/24/18 0043  NA  --    < > 140 140 140  --  140  --  140  K  --    < > 4.5 4.4 4.7  --  4.2  --  4.0  CL  --    < > 113* 113* 115*  --  115*  --  114*  CO2  --    < > 19* 18* 18*  --  19*  --  18*  GLUCOSE  --    < > 126* 86 136*  --  206*  --  91  BUN  --    < > 73* 68* 73*  --  61*  --  59*  CREATININE  --    < > 4.94* 4.56* 4.34*  --  3.63*  --  3.74*  CALCIUM  --    < > 7.6* 7.8* 7.8*  --  7.9*  --  7.9*  MG 2.0  --   --   --   --   --   --   --   --   PHOS 5.9*  --   --   --   --  4.4  --  4.2  --    < > = values in this interval not displayed.      CBC: Recent Labs  Lab 01/18/18 0818 01/19/18 0555 01/20/18 0441 01/22/18 0341 01/23/18 0447  WBC 7.6 7.5 8.3 8.2 7.3  HGB 9.4* 7.8* 7.7* 7.4* 7.8*  HCT 27.2* 22.4* 22.6* 22.1* 22.6*  MCV 93.7 94.1 93.9 96.9 94.0  PLT 205 176 187 191 162      Lab Results  Component Value Date   HEPBSAG Negative 01/22/2018   HEPBSAB Non Reactive 01/22/2018   HEPBIGM Negative 01/18/2018      Microbiology:  No results found for this or any previous visit (from the past 240 hour(s)).  Coagulation Studies: No results for input(s): LABPROT, INR in the last 72 hours.  Urinalysis: No results for input(s): COLORURINE, LABSPEC, [place], [place], [place], [place], [place], [place], [place], NITRITE, LEUKOCYTESUR  in the last 72 hours.  Invalid input(s): APPERANCEUR    Imaging: No results found.   Medications:    . allopurinol  100 mg Oral Daily  . apixaban  2.5 mg Oral BID  . atorvastatin  10 mg Oral q1800  . insulin aspart  0-9 Units Subcutaneous TID WC  . levothyroxine  50 mcg Oral QAC breakfast  . midodrine  10 mg Oral TID WC  . multivitamin-lutein  1 capsule Oral Daily  . OLANZapine  2.5 mg Oral QHS  . povidone-iodine   Topical BID  . protein supplement shake  11 oz Oral BID BM  . senna-docusate  2 tablet Oral BID  . sodium bicarbonate  1,300 mg Oral BID  . tuberculin  5 Units Intradermal Once  . vitamin C  500 mg Oral BID   [DISCONTINUED] ondansetron **OR** ondansetron (ZOFRAN) IV, ondansetron (ZOFRAN) IV, oxyCODONE-acetaminophen, polyethylene glycol  Assessment/ Plan:  77 y.o. Caucasian male with medical problems of chronic systolic congestive heart failure, diabetes type 2, atrial fibrillation, hypertension, history of gastric ulcer, coronary disease, who was admitted to [facility] on 01/13/2018 for evaluation of altered mental status.   1.  Acute renal failure on chronic kidney disease stage III Baseline creatinine 1.30/GFR 51 from December 20, 2017 Admission  creatinine 3.86, still critically high at 4.5 The cause of acute renal failure appears to be multifactorial including hypotension, sepsis, likely leading to ATN. Renal u/s shows left non obstructing stone 5 mm; no hydronephrosis  -Patient due for third dialysis treatment today.  EGFR remains low at 14.  Outpatient dialysis placement completed.  2.  UTI and sepsis, Pseudomonas aeuroginosa Treated with cefepime.   3. Hyperkalemia -Resolved with dialysis.Marland Kitchen    4.  Metabolic acidosis.  Patient still has low serum bicarbonate of 18.  With continued dialysis serum bicarbonate should improve.  5.  Anemia of chronic kidney disease.  Hemoglobin 7.8 at last check.  He will require Epogen as an outpatient.    LOS: 11 Bruna Dills 10/4/201912:38 PM  [place], [place]  Note: This note was prepared with Dragon dictation. Any transcription errors are unintentional

## 2018-01-24 NOTE — Progress Notes (Signed)
HD tx end    01/24/18 0044  Vital Signs  Pulse Rate 77  Pulse Rate Source Monitor  Resp 16  BP 121/82  BP Location Left Arm  BP Method Automatic  Patient Position (if appropriate) Lying  Oxygen Therapy  SpO2 99 %  O2 Device Room Air  During Hemodialysis Assessment  Dialysis Fluid Bolus Normal Saline  Bolus Amount (mL) 250 mL  Intra-Hemodialysis Comments Tx completed

## 2018-01-24 NOTE — Progress Notes (Signed)
Sacral foams changed and bilateral heel foams changed. Patient tolerated well. Scant amount of blood to sacral dressing, size of nickel.

## 2018-01-24 NOTE — Care Management Important Message (Signed)
Important Message  Patient Details  Name: Gregory Tyler MRN: [id number] Date of Birth: 20-Aug-1940   Medicare Important Message Given:  Yes    Beverly Sessions, RN 01/24/2018, 4:22 PM

## 2018-01-24 NOTE — Progress Notes (Signed)
[facility] [place] NAME: Gregory Tyler    MR#:  [id number]  DATE OF BIRTH:  [birth year]  SUBJECTIVE:   No new complaints. Tolerating hemodialysis well.  REVIEW OF SYSTEMS:   Review of Systems  Constitutional: Negative for chills, fever and weight loss.  HENT: Negative for ear discharge, ear pain and nosebleeds.   Eyes: Negative for blurred vision, pain and discharge.  Respiratory: Negative for sputum production, shortness of breath, wheezing and stridor.   Cardiovascular: Negative for chest pain, palpitations, orthopnea and PND.  Gastrointestinal: Negative for abdominal pain, diarrhea, nausea and vomiting.  Genitourinary: Negative for frequency and urgency.  Musculoskeletal: Negative for back pain and joint pain.  Neurological: Positive for weakness. Negative for sensory change, speech change and focal weakness.  Psychiatric/Behavioral: Negative for depression and hallucinations. The patient is not nervous/anxious.    Tolerating Diet:yes Tolerating PT: snf  DRUG ALLERGIES:   Allergies  Allergen Reactions  . Aspirin     GI Bleeding  . Codeine Other (See Comments)    BLISTERS Other reaction(s): Other (See Comments) BLISTERS   . Codeine Sulfate Other (See Comments)    Other reaction(s): Other (See Comments) BLISTERS BLISTERS    VITALS:  Blood pressure 127/61, pulse 73, temperature 97.7 F (36.5 C), temperature source Oral, resp. rate 18, height 5\' 8"  (1.727 m), weight 81.6 kg, SpO2 99 %.  PHYSICAL EXAMINATION:   Physical Exam  GENERAL:  77 y.o.-year-old patient lying in the bed with no acute distress.  EYES: Pupils equal, round, reactive to light and accommodation. No scleral icterus. Extraocular muscles intact. PALLOR+ HEENT: Head atraumatic, normocephalic. Oropharynx and nasopharynx clear.  NECK:  Supple, no jugular venous distention. No thyroid enlargement, no tenderness.  LUNGS: Normal breath sounds  bilaterally, no wheezing, rales, rhonchi. No use of accessory muscles of respiration.  CARDIOVASCULAR: S1, S2 normal. No murmurs, rubs, or gallops.  ABDOMEN: Soft, nontender, nondistended. Bowel sounds present. No organomegaly or mass.  EXTREMITIES: No cyanosis, clubbing or edema b/l.   Bilateral lower extremity wounds chronic. NEUROLOGIC: Cranial nerves II through XII are intact. No focal Motor or sensory deficits b/l. Feeling weak PSYCHIATRIC:  patient is alert and oriented x 3.  SKIN: No obvious rash, lesion, or ulcer.   LABORATORY PANEL:  CBC Recent Labs  Lab 01/23/18 0447  WBC 7.3  HGB 7.8*  HCT 22.6*  PLT 162    Chemistries  Recent Labs  Lab 01/18/18 0818  01/23/18 0447 01/24/18 0043  NA  --    < > 140 140  K  --    < > 4.2 4.0  CL  --    < > 115* 114*  CO2  --    < > 19* 18*  GLUCOSE  --    < > 206* 91  BUN  --    < > 61* 59*  CREATININE  --    < > 3.63* 3.74*  CALCIUM  --    < > 7.9* 7.9*  MG 2.0  --   --   --   AST  --    < > 86*  --   ALT  --    < > 127*  --   ALKPHOS  --    < > 194*  --   BILITOT  --    < > 0.5  --    < > = values in this interval not displayed.   Cardiac Enzymes No results for  input(s): TROPONINI in the last 168 hours. RADIOLOGY:  No results found. 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 Tama High over the weekend regarding positive TB test  *Acute encephalopathy due to  sepsis Resolved  *Acute septic shock due to Pseudomonas UTI  Resolved Successfully weaned off pressors Treated with 6-day course of cefepime-discontinued on January 21, 2018  *Acute transaminitis  Resolving Suspected due to sepsis/acute shock liver GI did see patient while in house-no intervention recommended, continue to avoid hepatotoxic agents, hepatic panel negative - abdominal ultrasound per radiology noted for fatty liver/hepatocellular disease    *Chronic systolic CHF without exacerbation repeat echo shows normal EF Stable on current regimen  *Chronic diabetes mellitus type 2 Stable on current regiment  *Hypertension  Normotensive without antihypertensive medications   *Paroxysmal atrial fibrillation  Discontinue heparin drip, started Eliquis   Discharge to skilled nursing facility after dialysis chair secured in outpatient setting  -pt is from [facility]. He is a long-term resident.   CODE STATUS: dnr   Case discussed with Care Management/Social Worker. Management plans discussed with the patient, family and they are in agreement.    DVT Prophylaxis: eliquis  TOTAL TIME TAKING CARE OF THIS PATIENT: *30* minutes.  >50% time spent on counselling and coordination of care  POSSIBLE D/C IN **1-2DAYS, DEPENDING ON CLINICAL CONDITION.  Note: This dictation was prepared with Dragon dictation along with smaller phrase technology. Any transcriptional errors that result from this process are unintentional.  Fritzi Mandes M.D on 01/24/2018 at 3:05 PM  Between 7am to 6pm - Pager - [phone]  After 6pm go to [email or web address] - password [email or web address] [facility]  Office  [phone]  CC: Primary care physician; [name], MDPatient ID: Gregory Tyler, male   DOB: [birth year], 77 y.o.   MRN: [id number]

## 2018-01-24 NOTE — Progress Notes (Addendum)
Post HD assessment. Pt tolerated tx well without c/o or complications. Dressing changed, sodium citrate locked.Net UF 45m, goal met.    01/24/18 0103  Vital Signs  Temp 98.2 F (36.8 C)  Temp Source Oral  Pulse Rate 68  Pulse Rate Source Monitor  Resp 16  BP (!) 135/57  BP Location Left Arm  BP Method Automatic  Patient Position (if appropriate) Lying  Oxygen Therapy  SpO2 99 %  O2 Device Room Air  Dialysis Weight  Weight 81.6 kg  Type of Weight Post-Dialysis  Post-Hemodialysis Assessment  Rinseback Volume (mL) 250 mL  KECN 33.4 V  Dialyzer Clearance Lightly streaked  Duration of HD Treatment -hour(s) 2.5 hour(s)  Hemodialysis Intake (mL) 500 mL  UF Total -Machine (mL) 548 mL  Net UF (mL) 48 mL  Tolerated HD Treatment Yes  Education / Care Plan  Dialysis Education Provided Yes  Documented Education in Care Plan Yes

## 2018-01-24 NOTE — Progress Notes (Signed)
PT Cancellation Note  Patient Details Name: Gregory Tyler MRN: [id number] DOB: 1940/05/14   Cancelled Treatment:    Reason Eval/Treat Not Completed: Patient declined, no reason specified.  Upon PT/OT arrival to pt's room, pt declining therapy.  Pt reporting that he did not get to bed until 3:30 this morning (night dialysis session noted in chart), was tired, and still had dialysis today.  Nurse notified.  Leitha Bleak, PT 01/24/18, 4:06 PM [phone]

## 2018-01-25 LAB — HEPATIC FUNCTION PANEL
ALT: 58 U/L — ABNORMAL HIGH (ref 0–44)
AST: 70 U/L — AB (ref 15–41)
Albumin: 1.8 g/dL — ABNORMAL LOW (ref 3.5–5.0)
Alkaline Phosphatase: 170 U/L — ABNORMAL HIGH (ref 38–126)
BILIRUBIN TOTAL: 0.4 mg/dL (ref 0.3–1.2)
Bilirubin, Direct: <0.1 mg/dL (ref 0.0–0.2)
Total Protein: 4.5 g/dL — ABNORMAL LOW (ref 6.5–8.1)

## 2018-01-25 LAB — BASIC METABOLIC PANEL
ANION GAP: 5 (ref 5–15)
BUN: 25 mg/dL — ABNORMAL HIGH (ref 8–23)
CHLORIDE: 110 mmol/L (ref 98–111)
CO2: 27 mmol/L (ref 22–32)
CREATININE: 1.72 mg/dL — AB (ref 0.61–1.24)
Calcium: 7.7 mg/dL — ABNORMAL LOW (ref 8.9–10.3)
GFR calc Af Amer: 42 mL/min — ABNORMAL LOW (ref >60)
GFR calc non Af Amer: 36 mL/min — ABNORMAL LOW (ref >60)
GLUCOSE: 124 mg/dL — AB (ref 70–99)
POTASSIUM: 3.4 mmol/L — AB (ref 3.5–5.1)
Sodium: 142 mmol/L (ref 135–145)

## 2018-01-25 LAB — GLUCOSE, CAPILLARY
GLUCOSE-CAPILLARY: 93 mg/dL (ref 70–99)
GLUCOSE-CAPILLARY: 101 mg/dL — AB (ref 70–99)
Glucose-Capillary: 102 mg/dL — ABNORMAL HIGH (ref 70–99)
Glucose-Capillary: 141 mg/dL — ABNORMAL HIGH (ref 70–99)
Glucose-Capillary: 99 mg/dL (ref 70–99)

## 2018-01-25 LAB — PHOSPHORUS: PHOSPHORUS: 2.6 mg/dL (ref 2.5–4.6)

## 2018-01-25 NOTE — Progress Notes (Signed)
HD Treatment Complete    01/25/18 1632  Vital Signs  Pulse Rate 65  Pulse Rate Source Monitor  Resp 18  BP 91/72  BP Location Right Arm  BP Method Automatic  Patient Position (if appropriate) Lying  Oxygen Therapy  SpO2 100 %  O2 Device Room Air  During Hemodialysis Assessment  Intra-Hemodialysis Comments Tolerated well;Tx completed

## 2018-01-25 NOTE — Progress Notes (Signed)
Pre HD Treatment    01/25/18 1256  Vital Signs  Temp 98.4 F (36.9 C)  Temp Source Oral  Pulse Rate 63  Pulse Rate Source Monitor  Resp 18  BP (!) 124/58  BP Location Right Arm  BP Method Automatic  Patient Position (if appropriate) Lying  Oxygen Therapy  SpO2 100 %  O2 Device Room Air  Pain Assessment  Pain Scale 0-10  Pain Score 0  Dialysis Weight  Weight 81.6 kg  Type of Weight Pre-Dialysis  Time-Out for Hemodialysis  What Procedure? HD  Pt Identifiers(min of two) First/Last Name;MRN/Account#;Pt's DOB(use if MRN/Acct# not available  Correct Site? Yes  Correct Side? Yes  Correct Procedure? Yes  Consents Verified? Yes  Rad Studies Available? N/A  Safety Precautions Reviewed? Yes  Engineer, civil (consulting) Number  (4A)  Station Number  (BS 228)  UF/Alarm Test Passed  Conductivity: Meter 14  Conductivity: Machine  13.9  pH 7.3  Reverse Osmosis  (RO 3)  Normal Saline Lot Number [id number]  Dialyzer Lot Number [id number]  Disposable Set Lot Number [id number]  Machine Temperature 98.6 F (37 C)  Musician and Audible Yes  Blood Lines Intact and Secured Yes  Pre Treatment Patient Checks  Vascular access used during treatment Catheter  Hepatitis B Surface Antigen Results Negative  Date Hepatitis B Surface Antigen Drawn 01/22/18  Hepatitis B Surface Antibody  (<10)  Date Hepatitis B Surface Antibody Drawn 01/22/18  Hemodialysis Consent Verified Yes  Hemodialysis Standing Orders Initiated Yes  ECG (Telemetry) Monitor On Yes  Prime Ordered Normal Saline  Length of  DialysisTreatment -hour(s) 3 Hour(s)  Dialyzer Elisio 17H NR  Dialysate 3K, 2.5 Ca  Dialysis Anticoagulant None  Dialysate Flow Ordered 600  Blood Flow Rate Ordered 300 mL/min  Ultrafiltration Goal 0.5 Liters  Pre Treatment Labs Phosphorus  Dialysis Blood Pressure Support Ordered Normal Saline  Education / Care Plan  Dialysis Education Provided Yes  Documented Education in Care Plan Yes  Note   Observations Catheter dressing clean, dry, intact

## 2018-01-25 NOTE — Progress Notes (Signed)
OT Cancellation Note  Patient Details Name: GAETAN SPIEKER MRN: [id number] DOB: [birth year]   Cancelled Treatment:    Reason Eval/Treat Not Completed: Patient at procedure or test/ unavailable Pt currently doing HD, will follow up with pt next date as available and appropriate.  Zenovia Jarred, MSOT, OTR/L Behavioral Health OT/ Acute Relief OT   Zenovia Jarred 01/25/2018, 2:50 PM

## 2018-01-25 NOTE — Progress Notes (Signed)
Pre HD Assessment    01/25/18 1300  Neurological  Level of Consciousness Alert  Orientation Level Oriented X4  Respiratory  Respiratory Pattern Regular;Unlabored;Symmetrical  Chest Assessment Chest expansion symmetrical  Bilateral Breath Sounds Clear  Cardiac  Pulse Regular  Heart Sounds No adventitious heart sounds  Jugular Venous Distention (JVD) No  ECG Monitor Yes  Cardiac Rhythm NSR  Antiarrhythmic device No  Vascular  R Radial Pulse +2  L Radial Pulse +2  R Dorsalis Pedis Pulse  (UTA )  L Dorsalis Pedis Pulse  (UTA)  Edema Left lower extremity;Right lower extremity;Right upper extremity;Left upper extremity  RUE Edema +2  LUE Edema +2  RLE Edema Non-pitting  LLE Edema +2  Integumentary  Integumentary (WDL) X  Skin Color Pale  Skin Condition Dry  Skin Integrity Abrasion;Ecchymosis;Excoriated (scratch marks);Surgical Incision (see LDA)  Ecchymosis Location Arm  Ecchymosis Location Orientation Bilateral  Musculoskeletal  Musculoskeletal (WDL) X  Generalized Weakness Yes  GU Assessment  Genitourinary (WDL) X (HD pt)  Genitourinary Symptoms External catheter  Psychosocial  Psychosocial (WDL) WDL  Patient Behaviors Appropriate for age

## 2018-01-25 NOTE — Progress Notes (Signed)
HD Treatment Initiated    01/25/18 1320  During Hemodialysis Assessment  Blood Flow Rate (mL/min) 300 mL/min  Arterial Pressure (mmHg) -90 mmHg  Venous Pressure (mmHg) 70 mmHg  Transmembrane Pressure (mmHg) 60 mmHg  Ultrafiltration Rate (mL/min) 330 mL/min  Dialysate Flow Rate (mL/min) 600 ml/min  Conductivity: Machine  13.9  HD Safety Checks Performed Yes  Dialysis Fluid Bolus Normal Saline  Bolus Amount (mL) 250 mL  Intra-Hemodialysis Comments Tx initiated;Progressing as prescribed

## 2018-01-25 NOTE — Progress Notes (Signed)
[place] [facility] [place] NAME: Gregory Tyler    MR#:  [id number]  DATE OF BIRTH:  1940-09-14  SUBJECTIVE:  Patient is resting comfortably.  Reports that his skin test for TB was positive few years ago but did not receive any treatment at that time No new complaints. Tolerating hemodialysis well.  REVIEW OF SYSTEMS:   Review of Systems  Constitutional: Negative for chills, fever and weight loss.  HENT: Negative for ear discharge, ear pain and nosebleeds.   Eyes: Negative for blurred vision, pain and discharge.  Respiratory: Negative for sputum production, shortness of breath, wheezing and stridor.   Cardiovascular: Negative for chest pain, palpitations, orthopnea and PND.  Gastrointestinal: Negative for abdominal pain, diarrhea, nausea and vomiting.  Genitourinary: Negative for frequency and urgency.  Musculoskeletal: Negative for back pain and joint pain.  Neurological: Positive for weakness. Negative for sensory change, speech change and focal weakness.  Psychiatric/Behavioral: Negative for depression and hallucinations. The patient is not nervous/anxious.    Tolerating Diet:yes Tolerating PT: snf  DRUG ALLERGIES:   Allergies  Allergen Reactions  . Aspirin     GI Bleeding  . Codeine Other (See Comments)    BLISTERS Other reaction(s): Other (See Comments) BLISTERS   . Codeine Sulfate Other (See Comments)    Other reaction(s): Other (See Comments) BLISTERS BLISTERS    VITALS:  Blood pressure (!) 127/56, pulse 65, temperature 98.4 F (36.9 C), temperature source Oral, resp. rate 18, height 5\' 8"  (1.727 m), weight 81.6 kg, SpO2 100 %.  PHYSICAL EXAMINATION:   Physical Exam  GENERAL:  77 y.o.-year-old patient lying in the bed with no acute distress.  EYES: Pupils equal, round, reactive to light and accommodation. No scleral icterus. Extraocular muscles intact. PALLOR+ HEENT: Head atraumatic, normocephalic. Oropharynx  and nasopharynx clear.  NECK:  Supple, no jugular venous distention. No thyroid enlargement, no tenderness.  LUNGS: Normal breath sounds bilaterally, no wheezing, rales, rhonchi. No use of accessory muscles of respiration.  CARDIOVASCULAR: S1, S2 normal. No murmurs, rubs, or gallops.  ABDOMEN: Soft, nontender, nondistended. Bowel sounds present. No organomegaly or mass.  EXTREMITIES: No cyanosis, clubbing or edema b/l.   Bilateral lower extremity wounds chronic. NEUROLOGIC: Cranial nerves II through XII are intact. No focal Motor or sensory deficits b/l. Feeling weak PSYCHIATRIC:  patient is alert and oriented x 3.  SKIN: No obvious rash, lesion, or ulcer.   LABORATORY PANEL:  CBC Recent Labs  Lab 01/23/18 0447  WBC 7.3  HGB 7.8*  HCT 22.6*  PLT 162    Chemistries  Recent Labs  Lab 01/25/18 0411  NA 142  K 3.4*  CL 110  CO2 27  GLUCOSE 124*  BUN 25*  CREATININE 1.72*  CALCIUM 7.7*  AST 70*  ALT 58*  ALKPHOS 170*  BILITOT 0.4   Cardiac Enzymes No results for input(s): TROPONINI in the last 168 hours. RADIOLOGY:  No results found. 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 PPD was not done since patient had developed severe reactions/cellulitis  in the remote past according to the patient. I have discussed with on-call infectious disease Dr. Steva Ready who has recommended outpatient follow-up, given elevated LFTs not considering INH treatment at this time.  Discontinue inpatient ID  consult Chest x-ray negative.  Elevated transaminases is a contraindication for INH treatment Okay to discharge patient from ID standpoint  *Acute encephalopathy due to sepsis Resolved  *Acute septic shock due to Pseudomonas UTI  Resolved Successfully weaned off pressors Treated with 6-day course of cefepime-discontinued on January 21, 2018  *Acute transaminitis  Resolving Suspected due to sepsis/acute shock liver GI did see patient while in house-no intervention recommended, continue to avoid hepatotoxic agents, hepatic panel negative - abdominal ultrasound per radiology noted for fatty liver/hepatocellular disease    *Chronic systolic CHF without exacerbation repeat echo shows normal EF Stable on current regimen  *Chronic diabetes mellitus type 2 Stable on current regiment  *Hypertension  Normotensive without antihypertensive medications   *Paroxysmal atrial fibrillation  Discontinue heparin drip, started Eliquis   Discharge to skilled nursing facility after dialysis chair secured in outpatient setting  -pt is from [facility]. He is a long-term resident.   CODE STATUS: dnr   Case discussed with Care Management/Social Worker. Management plans discussed with the patient, family and they are in agreement.    DVT Prophylaxis: eliquis  TOTAL TIME TAKING CARE OF THIS PATIENT: *30* minutes.  >50% time spent on counselling and coordination of care  POSSIBLE D/C IN **1-2DAYS, DEPENDING ON CLINICAL CONDITION.  Note: This dictation was prepared with Dragon dictation along with smaller phrase technology. Any transcriptional errors that result from this process are unintentional.  [name] M.D on 01/25/2018 at 2:03 PM  Between 7am to 6pm - Pager - [phone]  After 6pm go to [email or web address] - password [email or web address] [facility]  Office  [phone]  CC: Primary care physician; [name], MDPatient ID: [name], male   DOB:  02/23/1941, 77 y.o.   MRN: [id number]

## 2018-01-25 NOTE — Progress Notes (Signed)
PT Cancellation Note  Patient Details Name: Gregory Tyler MRN: [id number] DOB: Jan 11, 1941   Cancelled Treatment:    Reason Eval/Treat Not Completed: Fatigue/lethargy limiting ability to participate   Pt offered and encouraged session this AM.  Pt reported feeling "Not good" and generally fatigued with nausea.  While he seemed in better spirits, he declined session.    Chesley Noon 01/25/2018, 10:59 AM

## 2018-01-25 NOTE — Progress Notes (Signed)
[facility], [place] 01/25/18  Subjective:  Patient due for hemodialysis today.  Orders have been prepared. Appears to be in good spirits today.   Objective:  Vital signs in last 24 hours:  Temp:  [97.5 F (36.4 C)-98.4 F (36.9 C)] 98.3 F (36.8 C) (10/05 1150) Pulse Rate:  [58-76] 72 (10/05 1150) Resp:  [16-20] 16 (10/05 1150) BP: (92-139)/(55-81) 113/64 (10/05 1150) SpO2:  [96 %-99 %] 96 % (10/05 1150)  Weight change:  Filed Weights   01/23/18 1455 01/23/18 2145 01/24/18 0103  Weight: 81.7 kg 81.2 kg 81.6 kg    Intake/Output:    Intake/Output Summary (Last 24 hours) at 01/25/2018 1249 Last data filed at 01/25/2018 1207 Gross per 24 hour  Intake -  Output 1050 ml  Net -1050 ml     Physical Exam: General:  Chronically ill-appearing, laying in the bed  HEENT  moist oral mucous membranes  Neck  supple  Pulm/lungs  diminished at bases, normal effort  CVS/Heart  irregular  Abdomen:   Soft, nondistended, BS present  Extremities:  + dependent peripheral edema  Neurologic:  Alert, follows commands    Basic Metabolic Panel:  Recent Labs  Lab 01/21/18 0834 01/22/18 0341 01/22/18 2348 01/23/18 0447 01/23/18 1906 01/24/18 0043 01/24/18 2032 01/25/18 0411  NA 140 140  --  140  --  140  --  142  K 4.4 4.7  --  4.2  --  4.0  --  3.4*  CL 113* 115*  --  115*  --  114*  --  110  CO2 18* 18*  --  19*  --  18*  --  27  GLUCOSE 86 136*  --  206*  --  91  --  124*  BUN 68* 73*  --  61*  --  59*  --  25*  CREATININE 4.56* 4.34*  --  3.63*  --  3.74*  --  1.72*  CALCIUM 7.8* 7.8*  --  7.9*  --  7.9*  --  7.7*  PHOS  --   --  4.4  --  4.2  --  2.9  --      CBC: Recent Labs  Lab 01/19/18 0555 01/20/18 0441 01/22/18 0341 01/23/18 0447  WBC 7.5 8.3 8.2 7.3  HGB 7.8* 7.7* 7.4* 7.8*  HCT 22.4* 22.6* 22.1* 22.6*  MCV 94.1 93.9 96.9 94.0  PLT 176 187 191 162      Lab Results  Component Value Date   HEPBSAG Negative 01/22/2018   HEPBSAB Non Reactive 01/22/2018   HEPBIGM Negative 01/18/2018      Microbiology:  No results found for this or any previous visit (from the past 240 hour(s)).  Coagulation Studies: No results for input(s): LABPROT, INR in the last 72 hours.  Urinalysis: No results for input(s): COLORURINE, LABSPEC, PHURINE, GLUCOSEU, HGBUR, BILIRUBINUR, KETONESUR, PROTEINUR, UROBILINOGEN, NITRITE, LEUKOCYTESUR in the last 72 hours.  Invalid input(s): APPERANCEUR    Imaging: No results found.   Medications:    . allopurinol  100 mg Oral Daily  . apixaban  2.5 mg Oral BID  . atorvastatin  10 mg Oral q1800  . insulin aspart  0-9 Units Subcutaneous TID WC  . levothyroxine  50 mcg Oral QAC breakfast  . midodrine  10 mg Oral TID WC  . multivitamin-lutein  1 capsule Oral Daily  . OLANZapine  2.5 mg Oral QHS  . povidone-iodine   Topical BID  . protein supplement shake  11  oz Oral BID BM  . senna-docusate  2 tablet Oral BID  . sodium bicarbonate  1,300 mg Oral BID  . tuberculin  5 Units Intradermal Once  . vitamin C  500 mg Oral BID   [DISCONTINUED] ondansetron **OR** ondansetron (ZOFRAN) IV, ondansetron (ZOFRAN) IV, oxyCODONE-acetaminophen, polyethylene glycol  Assessment/ Plan:  76 y.o. Caucasian male with medical problems of chronic systolic congestive heart failure, diabetes type 2, atrial fibrillation, hypertension, history of gastric ulcer, coronary disease, who was admitted to [facility] on 01/13/2018 for evaluation of altered mental status.   1.  Acute renal failure on chronic kidney disease stage III Baseline creatinine 1.30/GFR 51 from December 20, 2017 Admission creatinine 3.86, still critically high at 4.5 The cause of acute renal failure appears to be multifactorial including hypotension, sepsis, likely leading to ATN. Renal u/s shows left non obstructing stone 5 mm; no hydronephrosis  -Patient due for another dialysis treatment today.  Renal parameters have significantly improved with  the addition of dialysis.  2.  UTI and sepsis, Pseudomonas aeuroginosa Treated with cefepime.   3. Hyperkalemia -Resolved with dialysis, in fact potassium a bit low now.    4.  Metabolic acidosis.  Serum bicarbonate up to 27 with dialysis.  5.  Anemia of chronic kidney disease.  Continue to monitor hemoglobin over the course of the hospitalization.  Hemoglobin currently 7.8.  Consider Epogen as an outpatient.  6.  Positive PPD.  Await further infectious disease input.    LOS: 12 Gregory Tyler 10/5/201912:49 PM  Gregory Tyler  Note: This note was prepared with Dragon dictation. Any transcription errors are unintentional

## 2018-01-25 NOTE — Progress Notes (Signed)
Post HD Assessment

## 2018-01-25 NOTE — Progress Notes (Addendum)
Post HD Treatment  Pt tolerated HD treatment well. His vitals are stable upon end of treatment. All blood returned back to patient. Patient has no complaints at this time. His BVP was 52.3 and Net UF was 500 mL. Report called and given to primary RN.    01/25/18 1634  Hand-Off documentation  Report given to (Full Name) Seth Bake, RN  Report received from (Full Name) Stephannie Peters, RN  Vital Signs  Temp 98.2 F (36.8 C)  Temp Source Oral  Pulse Rate 60  Pulse Rate Source Monitor  Resp 18  BP 127/66  BP Location Right Arm  BP Method Automatic  Patient Position (if appropriate) Lying  Oxygen Therapy  SpO2 100 %  O2 Device Room Air  Pain Assessment  Pain Scale 0-10  Pain Score 0  Dialysis Weight  Weight 81 kg  Type of Weight Post-Dialysis  Post-Hemodialysis Assessment  Rinseback Volume (mL) 250 mL  KECN 229 V  Dialyzer Clearance Lightly streaked  Duration of HD Treatment -hour(s) 3 hour(s)  Hemodialysis Intake (mL) 500 mL  UF Total -Machine (mL) 1000 mL  Net UF (mL) 500 mL  Tolerated HD Treatment Yes  Hemodialysis Catheter Right Subclavian  Placement Date/Time: (c) (c)   Placed prior to admission: No  Orientation: Right  Access Location: Subclavian  Blue Lumen Status Capped (Central line)  Red Lumen Status Capped (Central line)  Purple Lumen Status N/A  Catheter fill solution Heparin 1000 units/ml  Catheter fill volume (Arterial) 1.5 cc  Catheter fill volume (Venous) 1.5  Dressing Type Biopatch  Dressing Status Clean;Dry;Intact  Drainage Description None  Post treatment catheter status Capped and Clamped

## 2018-01-25 NOTE — Progress Notes (Signed)
Sacral foams changed. Patient tolerated well. Scant amount of blood on dressing,size of dime.

## 2018-01-26 LAB — BASIC METABOLIC PANEL
ANION GAP: 8 (ref 5–15)
BUN: 20 mg/dL (ref 8–23)
CALCIUM: 7.6 mg/dL — AB (ref 8.9–10.3)
CHLORIDE: 103 mmol/L (ref 98–111)
CO2: 29 mmol/L (ref 22–32)
CREATININE: 1.6 mg/dL — AB (ref 0.61–1.24)
GFR calc Af Amer: 46 mL/min — ABNORMAL LOW (ref >60)
GFR calc non Af Amer: 40 mL/min — ABNORMAL LOW (ref >60)
GLUCOSE: 91 mg/dL (ref 70–99)
Potassium: 3.2 mmol/L — ABNORMAL LOW (ref 3.5–5.1)
Sodium: 140 mmol/L (ref 135–145)

## 2018-01-26 LAB — GLUCOSE, CAPILLARY
GLUCOSE-CAPILLARY: 122 mg/dL — AB (ref 70–99)
Glucose-Capillary: 81 mg/dL (ref 70–99)
Glucose-Capillary: 130 mg/dL — ABNORMAL HIGH (ref 70–99)
Glucose-Capillary: 152 mg/dL — ABNORMAL HIGH (ref 70–99)

## 2018-01-26 MED ORDER — POTASSIUM CHLORIDE CRYS ER 20 MEQ PO TBCR: 40.0000 meq | EXTENDED_RELEASE_TABLET | Freq: Once | ORAL | Status: DC

## 2018-01-26 NOTE — Progress Notes (Signed)
Gregory Tyler NAME: Gregory Tyler    MR#:  [id number]  DATE OF BIRTH:  1940-10-30  SUBJECTIVE:  Patient with no new complaints.  Reports that his skin test for TB was positive few years ago but did not receive any treatment at that time No new complaints. Tolerating hemodialysis well.  REVIEW OF SYSTEMS:   Review of Systems  Constitutional: Negative for chills, fever and weight loss.  HENT: Negative for ear discharge, ear pain and nosebleeds.   Eyes: Negative for blurred vision, pain and discharge.  Respiratory: Negative for sputum production, shortness of breath, wheezing and stridor.   Cardiovascular: Negative for chest pain, palpitations, orthopnea and PND.  Gastrointestinal: Negative for abdominal pain, diarrhea, nausea and vomiting.  Genitourinary: Negative for frequency and urgency.  Musculoskeletal: Negative for back pain and joint pain.  Neurological: Positive for weakness. Negative for sensory change, speech change and focal weakness.  Psychiatric/Behavioral: Negative for depression and hallucinations. The patient is not nervous/anxious.    Tolerating Diet:yes Tolerating PT: snf  DRUG ALLERGIES:   Allergies  Allergen Reactions  . Aspirin     GI Bleeding  . Codeine Other (See Comments)    BLISTERS Other reaction(s): Other (See Comments) BLISTERS   . Codeine Sulfate Other (See Comments)    Other reaction(s): Other (See Comments) BLISTERS BLISTERS    VITALS:  Blood pressure 127/69, pulse 66, temperature 97.7 F (36.5 C), temperature source Oral, resp. rate 19, height 5\' 8"  (1.727 m), weight 81 kg, SpO2 99 %.  PHYSICAL EXAMINATION:   Physical Exam  GENERAL:  77 y.o.-year-old patient lying in the bed with no acute distress.  EYES: Pupils equal, round, reactive to light and accommodation. No scleral icterus. Extraocular muscles intact. PALLOR+ HEENT: Head atraumatic, normocephalic. Oropharynx and  nasopharynx clear.  NECK:  Supple, no jugular venous distention. No thyroid enlargement, no tenderness.  LUNGS: Normal breath sounds bilaterally, no wheezing, rales, rhonchi. No use of accessory muscles of respiration.  CARDIOVASCULAR: S1, S2 normal. No murmurs, rubs, or gallops.  ABDOMEN: Soft, nontender, nondistended. Bowel sounds present. No organomegaly or mass.  EXTREMITIES: No cyanosis, clubbing or edema b/l.   Bilateral lower extremity wounds chronic. NEUROLOGIC: Cranial nerves II through XII are intact. No focal Motor or sensory deficits b/l. Feeling weak PSYCHIATRIC:  patient is alert and oriented x 3.  SKIN: No obvious rash, lesion, or ulcer.   LABORATORY PANEL:  CBC Recent Labs  Lab 01/23/18 0447  WBC 7.3  HGB 7.8*  HCT 22.6*  PLT 162    Chemistries  Recent Labs  Lab 01/25/18 0411 01/26/18 0741  NA 142 140  K 3.4* 3.2*  CL 110 103  CO2 27 29  GLUCOSE 124* 91  BUN 25* 20  CREATININE 1.72* 1.60*  CALCIUM 7.7* 7.6*  AST 70*  --   ALT 58*  --   ALKPHOS 170*  --   BILITOT 0.4  --    Cardiac Enzymes No results for input(s): TROPONINI in the last 168 hours. RADIOLOGY:  No results found. 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 PPD was not done since patient had developed severe reactions/cellulitis in the remote past according to the patient. I have discussed with on-call infectious disease Dr. Steva Ready who has recommended outpatient follow-up, given elevated LFTs not considering  INH treatment at this time.  Discontinue inpatient ID consult Chest x-ray negative.  Elevated transaminases is a contraindication for INH treatment Okay to discharge patient from ID standpoint, nephrologist is requesting outpatient ID consult and note with ID recommendations as patient lives in long-term facility and needs consistent outpatient hemodialysis  *Acute encephalopathy due to sepsis Resolved  *Acute septic shock due to Pseudomonas UTI  Resolved Successfully weaned off pressors Treated with 6-day course of cefepime-discontinued on January 21, 2018  *Acute transaminitis  Resolving Suspected due to sepsis/acute shock liver GI did see patient while in house-no intervention recommended, continue to avoid hepatotoxic agents, hepatic panel negative - abdominal ultrasound per radiology noted for fatty liver/hepatocellular disease    *Chronic systolic CHF without exacerbation repeat echo shows normal EF Stable on current regimen  *Chronic diabetes mellitus type 2 Stable on current regiment  *Hypertension  Normotensive without antihypertensive medications   *Paroxysmal atrial fibrillation  Discontinue heparin drip, started Eliquis   Discharge to skilled nursing facility after dialysis chair secured in outpatient setting  -pt is from [facility]. He is a long-term resident.   CODE STATUS: dnr   Case discussed with Care Management/Social Worker. Management plans discussed with the patient, family and they are in agreement.    DVT Prophylaxis: eliquis  TOTAL TIME TAKING CARE OF THIS PATIENT: *30* minutes.  >50% time spent on counselling and coordination of care  POSSIBLE D/C IN **1-2DAYS, DEPENDING ON CLINICAL CONDITION.  Note: This dictation was prepared with Dragon dictation along with smaller phrase technology. Any transcriptional errors that result from this process are unintentional.  [name] M.D on 01/26/2018 at 9:16 PM  Between 7am to 6pm - Pager -  [phone]  After 6pm go to [email or web address] - password [email or web address] [place] [facility]  Office  [phone]  CC: Primary care physician; [name], MDPatient ID: Gregory Tyler, male   DOB: 1940-08-05, 77 y.o.   MRN: [id number]

## 2018-01-26 NOTE — Progress Notes (Signed)
[facility], [place] 01/26/18  Subjective:  Next dialysis due for Tuesday. Resting comfortably in bed. Awaiting further input from infectious disease regarding potentially latent TB infection.  Objective:  Vital signs in last 24 hours:  Temp:  [98 F (36.7 C)-98.4 F (36.9 C)] 98 F (36.7 C) (10/06 1058) Pulse Rate:  [54-76] 76 (10/06 1058) Resp:  [16-20] 17 (10/06 1058) BP: (91-140)/(56-72) 109/56 (10/06 1058) SpO2:  [95 %-100 %] 96 % (10/06 1058) Weight:  [81 kg-81.6 kg] 81 kg (10/05 1634)  Weight change:  Filed Weights   01/24/18 0103 01/25/18 1256 01/25/18 1634  Weight: 81.6 kg 81.6 kg 81 kg    Intake/Output:    Intake/Output Summary (Last 24 hours) at 01/26/2018 1219 Last data filed at 01/26/2018 0812 Gross per 24 hour  Intake -  Output 500 ml  Net -500 ml     Physical Exam: General:  Chronically ill-appearing, laying in the bed  HEENT  moist oral mucous membranes  Neck  supple  Pulm/lungs  diminished at bases, normal effort  CVS/Heart  irregular  Abdomen:   Soft, nondistended, BS present  Extremities:  + dependent peripheral edema  Neurologic:  Alert, follows commands    Basic Metabolic Panel:  Recent Labs  Lab 01/22/18 0341 01/22/18 2348 01/23/18 0447 01/23/18 1906 01/24/18 0043 01/24/18 2032 01/25/18 0411 01/25/18 1320 01/26/18 0741  NA 140  --  140  --  140  --  142  --  140  K 4.7  --  4.2  --  4.0  --  3.4*  --  3.2*  CL 115*  --  115*  --  114*  --  110  --  103  CO2 18*  --  19*  --  18*  --  27  --  29  GLUCOSE 136*  --  206*  --  91  --  124*  --  91  BUN 73*  --  61*  --  59*  --  25*  --  20  CREATININE 4.34*  --  3.63*  --  3.74*  --  1.72*  --  1.60*  CALCIUM 7.8*  --  7.9*  --  7.9*  --  7.7*  --  7.6*  PHOS  --  4.4  --  4.2  --  2.9  --  2.6  --      CBC: Recent Labs  Lab 01/20/18 0441 01/22/18 0341 01/23/18 0447  WBC 8.3 8.2 7.3  HGB 7.7* 7.4* 7.8*  HCT 22.6* 22.1* 22.6*  MCV 93.9 96.9  94.0  PLT 187 191 162      Lab Results  Component Value Date   HEPBSAG Negative 01/22/2018   HEPBSAB Non Reactive 01/22/2018   HEPBIGM Negative 01/18/2018      Microbiology:  No results found for this or any previous visit (from the past 240 hour(s)).  Coagulation Studies: No results for input(s): LABPROT, INR in the last 72 hours.  Urinalysis: No results for input(s): COLORURINE, LABSPEC, PHURINE, GLUCOSEU, HGBUR, BILIRUBINUR, KETONESUR, PROTEINUR, UROBILINOGEN, NITRITE, LEUKOCYTESUR in the last 72 hours.  Invalid input(s): APPERANCEUR    Imaging: No results found.   Medications:    . allopurinol  100 mg Oral Daily  . apixaban  2.5 mg Oral BID  . atorvastatin  10 mg Oral q1800  . insulin aspart  0-9 Units Subcutaneous TID WC  . levothyroxine  50 mcg Oral QAC breakfast  . midodrine  10 mg Oral  TID WC  . multivitamin-lutein  1 capsule Oral Daily  . OLANZapine  2.5 mg Oral QHS  . povidone-iodine   Topical BID  . protein supplement shake  11 oz Oral BID BM  . senna-docusate  2 tablet Oral BID  . sodium bicarbonate  1,300 mg Oral BID  . tuberculin  5 Units Intradermal Once  . vitamin C  500 mg Oral BID   [DISCONTINUED] ondansetron **OR** ondansetron (ZOFRAN) IV, ondansetron (ZOFRAN) IV, oxyCODONE-acetaminophen, polyethylene glycol  Assessment/ Plan:  77 y.o. Caucasian male with medical problems of chronic systolic congestive heart failure, diabetes type 2, atrial fibrillation, hypertension, history of gastric ulcer, coronary disease, who was admitted to [facility] on 01/13/2018 for evaluation of altered mental status.   1.  Acute renal failure on chronic kidney disease stage III Baseline creatinine 1.30/GFR 51 from December 20, 2017 Admission creatinine 3.86. The cause of acute renal failure appears to be multifactorial including hypotension, sepsis, likely leading to ATN. Renal u/s shows left non obstructing stone 5 mm; no hydronephrosis -The patient's acute renal  failure has been rather prolonged.  We have initiated him on hemodialysis and patient appears much better after he was started on dialysis.  He was found to have an abnormal QuantiFERON gold test but is not felt to have active TB.  Awaiting final input from infectious disease regarding his disposition.  2.  UTI and sepsis, Pseudomonas aeuroginosa Treated with cefepime.   3. Hyperkalemia -Resolved with dialysis, in fact potassium a bit low now.    4.  Metabolic acidosis.  Serum bicarbonate improved to 29 with dialysis.  5.  Anemia of chronic kidney disease.  Patient will likely need Epogen as an outpatient.  6.  Positive Quantiferon gold test.  Await further infectious disease input.  Cannot take INH or rifampin secondary to liver toxicity.  Patient has elevated LFTs.    LOS: 13 Veena Sturgess 10/6/201912:19 PM  [place], [place]  Note: This note was prepared with Dragon dictation. Any transcription errors are unintentional

## 2018-01-27 LAB — BASIC METABOLIC PANEL
Anion gap: 8 (ref 5–15)
BUN: 26 mg/dL — ABNORMAL HIGH (ref 8–23)
CO2: 28 mmol/L (ref 22–32)
CREATININE: 2.16 mg/dL — AB (ref 0.61–1.24)
Calcium: 7.7 mg/dL — ABNORMAL LOW (ref 8.9–10.3)
Chloride: 104 mmol/L (ref 98–111)
GFR calc Af Amer: 32 mL/min — ABNORMAL LOW (ref >60)
GFR, EST NON AFRICAN AMERICAN: 28 mL/min — AB (ref >60)
Glucose, Bld: 95 mg/dL (ref 70–99)
Potassium: 4 mmol/L (ref 3.5–5.1)
SODIUM: 140 mmol/L (ref 135–145)

## 2018-01-27 LAB — GLUCOSE, CAPILLARY
GLUCOSE-CAPILLARY: 79 mg/dL (ref 70–99)
GLUCOSE-CAPILLARY: 140 mg/dL — AB (ref 70–99)
GLUCOSE-CAPILLARY: 200 mg/dL — AB (ref 70–99)
Glucose-Capillary: 125 mg/dL — ABNORMAL HIGH (ref 70–99)
Glucose-Capillary: 156 mg/dL — ABNORMAL HIGH (ref 70–99)

## 2018-01-27 MED ORDER — RENA-VITE PO TABS
1.0000 | ORAL_TABLET | Freq: Every day | ORAL | Status: DC
  Administered 2018-01-27 – 2018-01-30 (×3): 1 via ORAL
  Filled 2018-01-27 (×3): qty 1

## 2018-01-27 MED ORDER — CHLORHEXIDINE GLUCONATE CLOTH 2 % EX PADS
6.0000 | MEDICATED_PAD | Freq: Every day | CUTANEOUS | Status: DC
  Administered 2018-01-28 – 2018-01-30 (×3): 6 via TOPICAL

## 2018-01-27 MED ORDER — LOPERAMIDE HCL 2 MG PO CAPS
2.0000 mg | ORAL_CAPSULE | Freq: Four times a day (QID) | ORAL | Status: DC | PRN
  Administered 2018-01-27: 2 mg via ORAL
  Filled 2018-01-27: qty 1

## 2018-01-27 NOTE — Progress Notes (Signed)
Per ID RN, able to D/C airborne precautions.

## 2018-01-27 NOTE — Care Management Important Message (Signed)
Important Message  Patient Details  Name: Gregory Tyler MRN: [id number] Date of Birth: [birth year]   Medicare Important Message Given:  Yes    Beverly Sessions, RN 01/27/2018, 4:40 PM

## 2018-01-27 NOTE — Progress Notes (Signed)
Pt was able to tolerate sitting in the recliner chair for 4 hours today.

## 2018-01-27 NOTE — Progress Notes (Signed)
[facility]  ROUNDING NOTE   Subjective:   No complaints. Laying in bed.   Midodrine   Sodium bicarbonate  Objective:  Vital signs in last 24 hours:  Temp:  [97.7 F (36.5 C)-97.9 F (36.6 C)] 97.9 F (36.6 C) (10/07 1236) Pulse Rate:  [66-90] 90 (10/07 1236) Resp:  [18-19] 18 (10/07 1236) BP: (127-137)/(69-70) 137/70 (10/07 1236) SpO2:  [95 %-99 %] 97 % (10/07 1236)  Weight change:  Filed Weights   01/24/18 0103 01/25/18 1256 01/25/18 1634  Weight: 81.6 kg 81.6 kg 81 kg    Intake/Output: No intake/output data recorded.   Intake/Output this shift:  Total I/O In: 240 [P.O.:240] Out: -   Physical Exam: General: NAD,   Head: Normocephalic, atraumatic. Moist oral mucosal membranes  Eyes: Anicteric, PERRL  Neck: Supple, trachea midline  Lungs:  Clear to auscultation  Heart: Regular rate and rhythm  Abdomen:  Soft, nontender,   Extremities: Trace peripheral edema.  Neurologic: Nonfocal, moving all four extremities  Skin: No lesions  Access: RIJ permcath    Basic Metabolic Panel: Recent Labs  Lab 01/22/18 2348 01/23/18 0447 01/23/18 1906 01/24/18 0043 01/24/18 2032 01/25/18 0411 01/25/18 1320 01/26/18 0741 01/27/18 0501  NA  --  140  --  140  --  142  --  140 140  K  --  4.2  --  4.0  --  3.4*  --  3.2* 4.0  CL  --  115*  --  114*  --  110  --  103 104  CO2  --  19*  --  18*  --  27  --  29 28  GLUCOSE  --  206*  --  91  --  124*  --  91 95  BUN  --  61*  --  59*  --  25*  --  20 26*  CREATININE  --  3.63*  --  3.74*  --  1.72*  --  1.60* 2.16*  CALCIUM  --  7.9*  --  7.9*  --  7.7*  --  7.6* 7.7*  PHOS 4.4  --  4.2  --  2.9  --  2.6  --   --     Liver Function Tests: Recent Labs  Lab 01/21/18 0834 01/23/18 0447 01/25/18 0411  AST 115* 86* 70*  ALT 212* 127* 58*  ALKPHOS 251* 194* 170*  BILITOT 0.5 0.5 0.4  PROT  --   --  4.5*  ALBUMIN  --   --  1.8*   No results for input(s): LIPASE, AMYLASE in the last 168 hours. No  results for input(s): AMMONIA in the last 168 hours.  CBC: Recent Labs  Lab 01/22/18 0341 01/23/18 0447  WBC 8.2 7.3  HGB 7.4* 7.8*  HCT 22.1* 22.6*  MCV 96.9 94.0  PLT 191 162    Cardiac Enzymes: No results for input(s): CKTOTAL, CKMB, CKMBINDEX, TROPONINI in the last 168 hours.  BNP: Invalid input(s): POCBNP  CBG: Recent Labs  Lab 01/26/18 1646 01/26/18 2101 01/27/18 0737 01/27/18 1151 01/27/18 1314  GLUCAP 122* 152* 79 140* 200*    Microbiology: Results for orders placed or performed during the hospital encounter of 01/13/18  Blood culture (routine x 2)     Status: None   Collection Time: 01/13/18  1:39 PM  Result Value Ref Range Status   Specimen Description RIGHT ANTECUBITAL  Final   Special Requests   Final    BOTTLES DRAWN AEROBIC AND ANAEROBIC  Blood Culture adequate volume   Culture   Final    NO GROWTH 5 DAYS Performed at [facility], [address] [place], [place] [address]    Report Status 01/18/2018 FINAL  Final  Blood culture (routine x 2)     Status: None   Collection Time: 01/13/18  1:39 PM  Result Value Ref Range Status   Specimen Description LEFT ANTECUBITAL  Final   Special Requests   Final    BOTTLES DRAWN AEROBIC AND ANAEROBIC Blood Culture results may not be optimal due to an excessive volume of blood received in culture bottles   Culture   Final    NO GROWTH 5 DAYS Performed at [facility], [address], [place], [place] [address]    Report Status 01/18/2018 FINAL  Final  Urine Culture     Status: Abnormal   Collection Time: 01/13/18  1:39 PM  Result Value Ref Range Status   Specimen Description   Final    URINE, RANDOM Performed at [facility], [address], [place], [place] [address]    Special Requests   Final    NONE Performed at [facility], [address], [place], [place] [address]    Culture >=100,000 COLONIES/mL PSEUDOMONAS AERUGINOSA (A)  Final   Report Status  01/16/2018 FINAL  Final   Organism ID, Bacteria PSEUDOMONAS AERUGINOSA (A)  Final      Susceptibility   Pseudomonas aeruginosa - MIC*    CEFTAZIDIME 4 SENSITIVE Sensitive     CIPROFLOXACIN <=0.25 SENSITIVE Sensitive     GENTAMICIN <=1 SENSITIVE Sensitive     IMIPENEM 1 SENSITIVE Sensitive     PIP/TAZO <=4 SENSITIVE Sensitive     CEFEPIME 2 SENSITIVE Sensitive     * >=100,000 COLONIES/mL PSEUDOMONAS AERUGINOSA  MRSA PCR Screening     Status: None   Collection Time: 01/13/18  5:13 PM  Result Value Ref Range Status   MRSA by PCR NEGATIVE NEGATIVE Final    Comment:        The GeneXpert MRSA Assay (FDA approved for NASAL specimens only), is one component of a comprehensive MRSA colonization surveillance program. It is not intended to diagnose MRSA infection nor to guide or monitor treatment for MRSA infections. Performed at [facility], [address] [place], [place] [address]     Coagulation Studies: No results for input(s): LABPROT, INR in the last 72 hours.  Urinalysis: No results for input(s): COLORURINE, LABSPEC, PHURINE, GLUCOSEU, HGBUR, BILIRUBINUR, KETONESUR, PROTEINUR, UROBILINOGEN, NITRITE, LEUKOCYTESUR in the last 72 hours.  Invalid input(s): APPERANCEUR    Imaging: No results found.   Medications:    . allopurinol  100 mg Oral Daily  . apixaban  2.5 mg Oral BID  . atorvastatin  10 mg Oral q1800  . insulin aspart  0-9 Units Subcutaneous TID WC  . levothyroxine  50 mcg Oral QAC breakfast  . midodrine  10 mg Oral TID WC  . multivitamin  1 tablet Oral QHS  . multivitamin-lutein  1 capsule Oral Daily  . OLANZapine  2.5 mg Oral QHS  . povidone-iodine   Topical BID  . senna-docusate  2 tablet Oral BID  . sodium bicarbonate  1,300 mg Oral BID  . tuberculin  5 Units Intradermal Once  . vitamin C  500 mg Oral BID   loperamide, [DISCONTINUED] ondansetron **OR** ondansetron (ZOFRAN) IV, ondansetron (ZOFRAN) IV, oxyCODONE-acetaminophen, polyethylene  glycol  Assessment/ Plan:  [name] is a 77 y.o.  white male with chronic systolic congestive heart failure, diabetes mellitus type 2, atrial fibrillation, hypertension, history of gastric ulcer, coronary disease, who was admitted to [facility] on9/23/2019for evaluation of altered mental status.  1.  Acute renal failure on chronic kidney disease stage III Baseline creatinine 1.30/GFR 51 from December 20, 2017 Acute renal failure appears to be multifactorial including hypotension, sepsis, likely leading to ATN. Renal u/s shows left non obstructing stone 5 mm; no hydronephrosis Requiring hemodialysis treatment.  Continue TTS schedule.  Outpatient planning for [place] sodium bicarbonate.   2. Latent TB: with elevated LFTs - no indication for treatment at this time - Appreciate ID input.   3.  UTI and sepsis, Pseudomonas aeuroginosa Treated with cefepime.  4. Anemia of chronic kidney disease.  Hemoglobin 7.8 - EPO with HD treatment  5. Hypotension: blood pressure at goal, 137/70.  - midodrine ordered.    LOS: [place] 10/7/20193:09 PM

## 2018-01-27 NOTE — Consult Note (Signed)
NAME: Gregory Tyler  DOB: 06-15-1940  MRN: [id number]  Date/Time: 01/27/2018 10:17 AM Subjective:  REASON FOR CONSULT: Positive QuantiFERON gold ? Gregory Tyler is a 77 y.o. male with a history of diabetes mellitus, CKD, atrial fibrillation, prostate cancer status post prostatectomy and radiation,, history of gastric bypass surgery was admitted from [place] on 01/13/2018 with altered mental status.  He was found to be hypotensive and bradycardic and after giving Narcan he perked up.  He was admitted to ICU for a few days because of the need for dopamine, he was also treated as sepsis with vancomycin and Zosyn initially and then to cefepime.  His only positive culture was urine with Pseudomonas.  He also had abnormal LFTs and it peaked to AST of 1136 on 01/16/2018 and ALT of 685.  He also had abnormally worsening creatinine and had to go on hemodialysis.  For the process for hemodialysis a QuantiFERON gold was done and it was positive and I am asked to see the patient for the same.  His viral hepatitis panel has been negative. Patient does not have any fever or chills or cough.  He states he had a injection for TB when he was a boy to his upper arm and that had scarred.  That is very likely a BCG vaccination.  He does not remember getting any PPD test. He has not been able to walk for the past many years.  He is wheelchair-bound or bedbound.  He has had bilateral wounds especially on the heels.  On 01/23/2018 he was seen by the vascular surgeon and underwent I angiogram of the right lower extremity and had a percutaneous transluminal angioplasty of the right anterior tibial artery with 2.5 mm diameter by 30 cm length angioplasty balloon.  Past medical history Paroxysmal A. 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 CAD Mother   . Stroke Mother   . COPD Mother   . Heart failure Mother   . Diabetes Other    Allergies  Allergen Reactions  . Aspirin     GI Bleeding  . Codeine Other (See Comments)    BLISTERS Other reaction(s): Other (See Comments) BLISTERS   . Codeine Sulfate Other (See Comments)    Other reaction(s): Other (See Comments) BLISTERS BLISTERS  ? Current Facility-Administered Medications  Medication Dose Route Frequency Provider Last Rate Last Dose  . allopurinol (ZYLOPRIM) tablet 100 mg  100 mg Oral Daily Algernon Huxley, MD   100 mg at 01/27/18 0907  . apixaban (ELIQUIS) tablet 2.5 mg  2.5 mg Oral BID Algernon Huxley, MD   2.5 mg at 01/27/18 0908  . atorvastatin (LIPITOR) tablet 10 mg  10 mg Oral q1800 Algernon Huxley, MD   10 mg at 01/26/18 1806  . insulin aspart (novoLOG) injection 0-9 Units  0-9 Units Subcutaneous TID WC Algernon Huxley, MD   1 Units at 01/26/18 1806  . levothyroxine (SYNTHROID, LEVOTHROID) tablet 50 mcg  50 mcg Oral QAC breakfast Dew, Erskine Squibb, MD   50 mcg at  01/27/18 0908  . loperamide (IMODIUM) capsule 2 mg  2 mg Oral Q6H PRN Lance Coon, MD      . midodrine (PROAMATINE) tablet 10 mg  10 mg Oral TID WC Algernon Huxley, MD   10 mg at 01/27/18 0908  . multivitamin-lutein (OCUVITE-LUTEIN) capsule 1 capsule  1 capsule Oral Daily Algernon Huxley, MD   1 capsule at 01/27/18 0910  . OLANZapine (ZYPREXA) tablet 2.5 mg  2.5 mg Oral QHS Algernon Huxley, MD   2.5 mg at 01/26/18 2203  . ondansetron (ZOFRAN) injection 4 mg  4 mg Intravenous Q6H PRN Algernon Huxley, MD    4 mg at 01/15/18 1058  . ondansetron (ZOFRAN) injection 4 mg  4 mg Intravenous Q6H PRN Algernon Huxley, MD      . oxyCODONE-acetaminophen (PERCOCET/ROXICET) 5-325 MG per tablet 1 tablet  1 tablet Oral Q6H PRN Harrie Foreman, MD   1 tablet at 01/26/18 0739  . polyethylene glycol (MIRALAX / GLYCOLAX) packet 17 g  17 g Oral Daily PRN Algernon Huxley, MD      . povidone-iodine (BETADINE) 10 % external solution   Topical BID Algernon Huxley, MD      . protein supplement (PREMIER PROTEIN) liquid - approved for s/p bariatric surgery  11 oz Oral BID BM Algernon Huxley, MD   11 oz at 01/24/18 1022  . senna-docusate (Senokot-S) tablet 2 tablet  2 tablet Oral BID Algernon Huxley, MD   2 tablet at 01/26/18 2203  . sodium bicarbonate tablet 1,300 mg  1,300 mg Oral BID Algernon Huxley, MD   1,300 mg at 01/27/18 0909  . tuberculin injection 5 Units  5 Units Intradermal Once Algernon Huxley, MD      . vitamin C (ASCORBIC ACID) tablet 500 mg  500 mg Oral BID Algernon Huxley, MD   500 mg at 01/27/18 0908     Abtx:  Anti-infectives (From admission, onward)   Start     Dose/Rate Route Frequency Ordered Stop   01/23/18 1009  ceFAZolin (ANCEF) IVPB 2g/100 mL premix     2 g 200 mL/hr over 30 Minutes Intravenous 30 min pre-op 01/23/18 1009 01/23/18 1631   01/21/18 2200  ceFEPIme (MAXIPIME) 1 g in sodium chloride 0.9 % 100 mL IVPB  Status:  Discontinued    Note to Pharmacy:  Discontinue after today's dose   1 g 200 mL/hr over 30 Minutes Intravenous Every 24 hours 01/21/18 0728 01/21/18 1115   01/21/18 2200  ceFEPIme (MAXIPIME) 1 g in sodium chloride 0.9 % 100 mL IVPB    Note to Pharmacy:  Discontinue after today's dose   1 g 200 mL/hr over 30 Minutes Intravenous Every 24 hours 01/21/18 1115 01/21/18 2106   01/21/18 1837  ceFAZolin (ANCEF) IVPB 2g/100 mL premix     2 g 200 mL/hr over 30 Minutes Intravenous 30 min pre-op 01/21/18 1837 01/22/18 1316   01/18/18 2200  ceFEPIme (MAXIPIME) 1 g in sodium chloride 0.9 % 100 mL IVPB  Status:   Discontinued     1 g 200 mL/hr over 30 Minutes Intravenous Every 24 hours 01/18/18 0952 01/21/18 0728   01/16/18 2200  ceFEPIme (MAXIPIME) 2 g in sodium chloride 0.9 % 100 mL IVPB  Status:  Discontinued     2 g 200 mL/hr over 30 Minutes Intravenous Every 24 hours 01/16/18 1251 01/18/18 0952   01/15/18 1200  vancomycin (VANCOCIN) IVPB 1000 mg/200 mL premix  Status:  Discontinued     1,000 mg 200 mL/hr over 60 Minutes Intravenous Every 48 hours 01/14/18 1135 01/15/18 1022   01/15/18 1000  vancomycin (VANCOCIN) IVPB 750 mg/150 ml premix  Status:  Discontinued     750 mg 150 mL/hr over 60 Minutes Intravenous Every 48 hours 01/14/18 0839 01/14/18 1135   01/14/18 1000  vancomycin (VANCOCIN) IVPB 750 mg/150 ml premix  Status:  Discontinued     750 mg 150 mL/hr over 60 Minutes Intravenous Every 48 hours 01/13/18 1652 01/14/18 0839   01/14/18 0600  vancomycin (VANCOCIN) IVPB 750 mg/150 ml premix  Status:  Discontinued     750 mg 150 mL/hr over 60 Minutes Intravenous Every 48 hours 01/13/18 1548 01/13/18 1652   01/13/18 2200  piperacillin-tazobactam (ZOSYN) IVPB 3.375 g  Status:  Discontinued     3.375 g 12.5 mL/hr over 240 Minutes Intravenous Every 12 hours 01/13/18 1548 01/16/18 1238   01/13/18 1445  vancomycin (VANCOCIN) IVPB 1000 mg/200 mL premix     1,000 mg 200 mL/hr over 60 Minutes Intravenous  Once 01/13/18 1443 01/13/18 1624   01/13/18 1445  piperacillin-tazobactam (ZOSYN) IVPB 3.375 g     3.375 g 100 mL/hr over 30 Minutes Intravenous  Once 01/13/18 1443 01/13/18 1619      REVIEW OF SYSTEMS: States he does not remember what happened and why he was brought to the hospital.  He can only remember waking up in the hospital after 3 days.  Const: negative fever, negative chills, negative weight loss Eyes: negative diplopia or visual changes, negative eye pain ENT: negative coryza, negative sore throat Resp: negative cough, hemoptysis, dyspnea Cards: negative for chest pain,  palpitations, has lower extremity edema GU: Had hematuria, difficulty in passing urine in the past   GI: Negative for abdominal pain, diarrhea, bleeding, constipation Skin: negative for rash and pruritus Heme: negative for easy bruising and gum/nose bleeding MS: negative for myalgias, arthralgias, back pain and muscle weakness Neurolo:negative for headaches, dizziness, vertigo, memory problems  Psych: negative for feelings of anxiety, depression  Endocrine: negative for thyroid, diabetes Allergy/Immunology-multiple medication allergy as above ?  Objective:  VITALS:  BP 127/69 (BP Location: Left Arm)   Pulse 66   Temp 97.7 F (36.5 C) (Oral)   Resp 19   Ht 5\' 8"  (1.727 m)   Wt 81 kg   SpO2 99%   BMI 27.15 kg/m  PHYSICAL EXAM:  General: Alert, cooperative, no distress, appears stated age.  Pale Head: Normocephalic, without obvious abnormality, atraumatic. Eyes: Conjunctivae clear, anicteric sclerae. Pupils are equal ENT Nares normal. No drainage or sinus tenderness. Lips, mucosa, and tongue normal. No Thrush Neck: Supple, symmetrical, no adenopathy, thyroid: non tender no carotid bruit and no JVD. Right-sided tunneled catheter Lungs: Bilateral air entry.  No crepitation:  Heart: Systolic murmur heard in the mitral area, S1-S2 Abdomen: Soft, non-tender,not distended. Bowel sounds normal. No masses, lap scar Extremities: Bilateral superficial ulcers on the heels covered with a dressing.  Mild ankle edema   skin: No rashes or lesions. Or bruising Lymph: Cervical, supraclavicular normal. Neurologic: Grossly non-focal Pertinent Labs      IMAGING RESULTS: ? Impression/Recommendation ?77 y.o. male with a history of diabetes mellitus, CKD, atrial fibrillation, prostate cancer status post prostatectomy and radiation,, history of gastric bypass surgery was admitted from [place] on 01/13/2018 with altered mental status.  He was found to be hypotensive and bradycardic and  after giving Narcan he perked up.  He was admitted to ICU for  a few days because of the need for dopamine, he was also treated as sepsis with vancomycin and Zosyn initially and then to cefepime.  His only positive culture was urine with Pseudomonas.  He also had abnormal LFTs and it peaked to AST of 1136 on 01/16/2018 and ALT of 685.  He also had abnormally worsening creatinine and had to go on hemodialysis.  For the process for hemodialysis a QuantiFERON gold was done and it was positive and I am asked to see the patient for the same.  ? Positive QuantiFERON gold with a negative chest x-ray and no history suggestive of active tuberculosis .  Patient remembers getting an injection for TB testing he states on his upper arm.  This could be a BCG.  He does not room by having a positive PPD. He is at [employer] and I am not sure whether he has had a PPD test done that before. This looks like latent TB.  Patient currently does not have any clinical evidence suggestive of active tuberculosis.  Not sure when he seroconverted . He has 2 issues .  One is end-stage renal disease and he has to go to dialysis center, and is going to be living in a nursing home. He is unable to cough or bring up any sputum. Will need an HIV test.  If the HIV test is positive we will need induction of sputum for AFB.  If the nursing home or dialysis center has objection  taking him then he will need 3  induced sputum to check for AFB smear. If either place does not have a problem taking him then all he needs is an annual chest x-ray or if he becomes symptomatic with cough or fever or night sweats for more than 15 days then he will need to be tested for pulmonary TB. Currently treating for latent TB is not an option because of the recent abnormal LFTs which is just normalizing.    __________________________________________________ Discussed with patient and nephrologist

## 2018-01-27 NOTE — Progress Notes (Signed)
OT Cancellation Note  Patient Details Name: Gregory Tyler MRN: [id number] DOB: October 09, 1940   Cancelled Treatment:    Reason Eval/Treat Not Completed: Patient declined, no reason specified. Upon attempt, pt reports "I just got back to bed, I don't think I can get out of it again today." Pt declines OT this afternoon 2/2 fatigue. Agreeable to OT coming next date. Will re-attempt next date.   Jeni Salles, MPH, MS, OTR/L ascom [phone] 01/27/18, 3:45 PM

## 2018-01-27 NOTE — Progress Notes (Signed)
Physical Therapy Treatment Patient Details Name: Gregory Tyler MRN: [id number] DOB: [birth year] Today's Date: 01/27/2018    History of Present Illness 77 year old male admitted for AMS and encephalopathy due to UTI, acute renal failure and failure to thrive and was at [facility].  He was here ~1 month ago, for weakness and diarrhea and had pressure ulcers B LES feet as well as sacral pressure sores.  PMH includes stage III CKD, prostate CA, PAF, gout, cardiomyopathy and chronic pelvic pain. 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 Third attempt, pt agreeable to PT; reports 10 pain in B feet with movement and to the touch. Pt agreeable to up to the chair (needs to be able to tolerate for hemodialysis). Spoke with nursing requiring weight bearing status (noted as PWB BLEs in prior PT notes, no active order found); nurse advised weight bearing stay off B heels. Pt requires Mod A supine to sit to edge of bed; Mod A x 2 for STS and stand pivot to chair. Tolerates well overall and should be able to tolerate well for receiving hemodialysis in chair. Pt to be up in chair for approximately 4 hours. Continue PT to progress strength, endurance to improve all functional mobility.   Follow Up Recommendations        Equipment Recommendations       Recommendations for Other Services       Precautions / Restrictions Precautions Precautions: Fall Restrictions Weight Bearing Restrictions: Yes RLE Weight Bearing: Partial weight bearing LLE Weight Bearing: Partial weight bearing    Mobility  Bed Mobility Overal bed mobility: Needs Assistance Bed Mobility: Supine to Sit     Supine to sit: Mod assist     General bed mobility comments: Assist to scoot hips to edge of bed and manage LEs over edge of bed. Able to elevate trunk with use of bed rail and Min  guard  Transfers Overall transfer level: Needs assistance Equipment used: Rolling walker (2 wheeled) Transfers: Sit to/from [facility] Sit to Stand: Mod assist;+2 physical assistance Stand pivot transfers: Mod assist;+2 physical assistance       General transfer comment: Chair against bed for minimal time on feet. Transfers well and able to use UEs once sitting with assist at LEs to reposition for comfort in chair  Ambulation/Gait             General Gait Details: Unable    Stairs             Wheelchair Mobility    Modified Rankin (Stroke Patients Only)       Balance Overall balance assessment: Needs assistance   Sitting balance-Leahy Scale: Fair     Standing balance support: Bilateral upper extremity supported Standing balance-Leahy Scale: Poor                              Cognition Arousal/Alertness: Awake/alert Behavior During Therapy: WFL for tasks assessed/performed;Agitated Overall Cognitive Status: History of cognitive impairments - at baseline                                        Exercises      General Comments  Pertinent Vitals/Pain Pain Assessment: 0-10 Pain Score: 10-Worst pain ever Pain Location: B LEs, feet, heels due to wounds    Home Living                      Prior Function            PT Goals (current goals can now be found in the care plan section) Progress towards PT goals: Progressing toward goals    Frequency    Min 2X/week      PT Plan Current plan remains appropriate    Co-evaluation              AM-PAC PT "6 Clicks" Daily Activity  Outcome Measure  Difficulty turning over in bed (including adjusting bedclothes, sheets and blankets)?: Unable Difficulty moving from lying on back to sitting on the side of the bed? : Unable Difficulty sitting down on and standing up from a chair with arms (e.g., wheelchair, bedside commode, etc,.)?:  Unable Help needed moving to and from a bed to chair (including a wheelchair)?: A Lot Help needed walking in hospital room?: Total Help needed climbing 3-5 steps with a railing? : Total 6 Click Score: 7    End of Session Equipment Utilized During Treatment: Gait belt Activity Tolerance: Patient limited by pain Patient left: in chair;with call bell/phone within reach;with nursing/sitter in room Nurse Communication: Mobility status PT Visit Diagnosis: Muscle weakness (generalized) (M62.81);Difficulty in walking, not elsewhere classified (R26.2);Other abnormalities of gait and mobility (R26.89);Pain Pain - Right/Left: Left Pain - part of body: Ankle and joints of foot     Time: 1037-1100 PT Time Calculation (min) (ACUTE ONLY): 23 min  Charges:  $Therapeutic Activity: 23-37 mins                      Larae Grooms, PTA 01/27/2018, 1:43 PM

## 2018-01-27 NOTE — Progress Notes (Signed)
[facility] NAME: Gregory Tyler    MR#:  [id number]  DATE OF BIRTH:  [birth year]  SUBJECTIVE:  Patient with no new complaints.  Reports that his skin test for TB was positive few years ago but did not receive any treatment at that time  Tolerating hemodialysis well.  REVIEW OF SYSTEMS:   Review of Systems  Constitutional: Negative for chills, fever and weight loss.  HENT: Negative for ear discharge, ear pain and nosebleeds.   Eyes: Negative for blurred vision, pain and discharge.  Respiratory: Negative for sputum production, shortness of breath, wheezing and stridor.   Cardiovascular: Negative for chest pain, palpitations, orthopnea and PND.  Gastrointestinal: Negative for abdominal pain, diarrhea, nausea and vomiting.  Genitourinary: Negative for frequency and urgency.  Musculoskeletal: Negative for back pain and joint pain.  Neurological: Positive for weakness. Negative for sensory change, speech change and focal weakness.  Psychiatric/Behavioral: Negative for depression and hallucinations. The patient is not nervous/anxious.    Tolerating Diet:yes Tolerating PT: snf  DRUG ALLERGIES:   Allergies  Allergen Reactions  . Aspirin     GI Bleeding  . Codeine Other (See Comments)    BLISTERS Other reaction(s): Other (See Comments) BLISTERS   . Codeine Sulfate Other (See Comments)    Other reaction(s): Other (See Comments) BLISTERS BLISTERS    VITALS:  Blood pressure 137/70, pulse 90, temperature 97.9 F (36.6 C), temperature source Oral, resp. rate 18, height 5\' 8"  (1.727 m), weight 81 kg, SpO2 97 %.  PHYSICAL EXAMINATION:   Physical Exam  GENERAL:  77 y.o.-year-old patient lying in the bed with no acute distress.  EYES: Pupils equal, round, reactive to light and accommodation. No scleral icterus. Extraocular muscles intact. PALLOR+ HEENT: Head atraumatic, normocephalic. Oropharynx and nasopharynx clear.   NECK:  Supple, no jugular venous distention. No thyroid enlargement, no tenderness.  LUNGS: Normal breath sounds bilaterally, no wheezing, rales, rhonchi. No use of accessory muscles of respiration.  CARDIOVASCULAR: S1, S2 normal. No murmurs, rubs, or gallops.  ABDOMEN: Soft, nontender, nondistended. Bowel sounds present. No organomegaly or mass.  EXTREMITIES: No cyanosis, clubbing or edema b/l.   Bilateral lower extremity wounds chronic. NEUROLOGIC: Cranial nerves II through XII are intact. No focal Motor or sensory deficits b/l. Feeling weak PSYCHIATRIC:  patient is alert and oriented x 3.  SKIN: No obvious rash, lesion, or ulcer.   LABORATORY PANEL:  CBC Recent Labs  Lab 01/23/18 0447  WBC 7.3  HGB 7.8*  HCT 22.6*  PLT 162    Chemistries  Recent Labs  Lab 01/25/18 0411  01/27/18 0501  NA 142   < > 140  K 3.4*   < > 4.0  CL 110   < > 104  CO2 27   < > 28  GLUCOSE 124*   < > 95  BUN 25*   < > 26*  CREATININE 1.72*   < > 2.16*  CALCIUM 7.7*   < > 7.7*  AST 70*  --   --   ALT 58*  --   --   ALKPHOS 170*  --   --   BILITOT 0.4  --   --    < > = values in this interval not displayed.   Cardiac Enzymes No results for input(s): TROPONINI in the last 168 hours. RADIOLOGY:  No results found. 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 PPD was not done since patient had developed severe reactions/cellulitis in the remote past according to the patient. I have discussed with on-call infectious  disease Dr. Steva Ready , given pts  elevated LFTs not considering INH treatment at this time.   Chest x-ray negative.  No active tuberculosis elevated transaminases is a contraindication for INH treatment Okay to discharge patient from ID standpoint, if HIV  test is negative The HIV test is positive patient will need induction of sputum for AFB   *Acute encephalopathy due to sepsis Resolved  *Acute septic shock due to Pseudomonas UTI  Resolved Successfully weaned off pressors Treated with 6-day course of cefepime-discontinued on January 21, 2018  *Acute transaminitis  Resolving Suspected due to sepsis/acute shock liver GI did see patient while in house-no intervention recommended, continue to avoid hepatotoxic agents, hepatic panel negative - abdominal ultrasound per radiology noted for fatty liver/hepatocellular disease    *Chronic systolic CHF without exacerbation repeat echo shows normal EF Stable on current regimen  *Chronic diabetes mellitus type 2 Stable on current regiment  *Hypertension  Normotensive without antihypertensive medications   *Paroxysmal atrial fibrillation  Discontinue heparin drip, started Eliquis   Discharge to skilled nursing facility after dialysis chair secured in outpatient setting  -pt is from [facility]. He is a long-term resident.   CODE STATUS: dnr   Case discussed with Care Management/Social Worker. Management plans discussed with the patient, family and they are in agreement.    DVT Prophylaxis: eliquis  TOTAL TIME TAKING CARE OF THIS PATIENT: *30* minutes.  >50% time spent on counselling and coordination of care  POSSIBLE D/C IN **1-2DAYS, DEPENDING ON CLINICAL CONDITION.  Note: This dictation was prepared with Dragon dictation along with smaller phrase technology. Any transcriptional errors that result from this process are unintentional.  Gregory Tyler M.D on 01/27/2018 at 5:05 PM  Between 7am to 6pm - Pager -  [phone]  After 6pm go to [email or web address] - password [email or web address] [facility]  Office  [phone]  CC: Primary care physician; [name], MDPatient ID: Gregory Tyler, male   DOB: Apr 10, 1941, 77 y.o.   MRN: [id number]

## 2018-01-27 NOTE — Progress Notes (Signed)
Nutrition Follow Up Note   DOCUMENTATION CODES:   Severe malnutrition in context of chronic illness  INTERVENTION:   D/C Premier Protein as pt does not like  Add rena-vite daily in setting on HD  Add double protein with all meals  Liberalize diet   Continue Magic cup TID with meals, each supplement provides 290 kcal and 9 grams of protein  Continue Ocuvite daily for wound healing (provides zinc, vitamin A, vitamin C, Vitamin E, copper, and selenium)  Continue Vitamin C 500mg  po BID  NUTRITION DIAGNOSIS:   Severe Malnutrition related to chronic illness(CHF, DM, CKD, gastric ulcers, prostate cancer ) as evidenced by moderate to severe fat depletions, moderate to severe muscle depletions, 11 percent weight loss in 7 months.  GOAL:   Patient will meet greater than or equal to 90% of their needs  -progressing   MONITOR:   PO intake, Supplement acceptance, Labs, Weight trends, Skin, I & O's  ASSESSMENT:    77 y.o. male   admitted on 01/13/2018 with a known history of chronic systolic CHF, diabetes type 2, paroxysmal atrial fibrillation, coronary artery disease, gastric ulcer, hypertension, chronic kidney disease, hyperlipidemia who currently resides in a skilled nursing facility brought from there for altered mental status. Pt found to have latent TB  Pt continues to do well; eating fairly well since admit but refusing supplements. RD will discontinue Premier Protein as pt does not like. Will add double protein with meals and liberalize pt's diet so he will have access to more protein. Pt s/p perm cath placement 10/2 and HD initiation. Pt s/p angiogram 10/3. Recommend continue vitamins to encourage wound healing and replace losses from HD. Per chart, pt is weight stable since admit.    Medications reviewed and include: allopurinol, insulin, synthroid, senokot, Na bicarb, vitamin C, ocuvite  Labs reviewed: K 4.0 wnl P 2.6 wnl- 10/5 Hgb 7.8(L), Hct 22.6(L)- 10/3  Diet Order:    Diet Order            Diet regular Room service appropriate? Yes; Fluid consistency: Thin  Diet effective now             EDUCATION NEEDS:   Education needs have been addressed  Skin:  Skin Assessment: Reviewed RN Assessment(wounds on R heel, R toe, L foot, Stage III scarum and buttocks )  Last BM:  10/7- type 6  Height:   Ht Readings from Last 1 Encounters:  01/23/18 5\' 8"  (1.727 m)    Weight:   Wt Readings from Last 1 Encounters:  01/25/18 81 kg    Ideal Body Weight:  70 kg  BMI:  Body mass index is 27.15 kg/m.  Estimated Nutritional Needs:   Kcal:  1800-2100kcal/day   Protein:  102-115g/day   Fluid:  >1.7L/day   Koleen Distance MS, RD, LDN Pager #- [phone] Office#- [phone] After Hours Pager: [phone]

## 2018-01-28 LAB — BASIC METABOLIC PANEL
ANION GAP: 8 (ref 5–15)
BUN: 33 mg/dL — AB (ref 8–23)
CALCIUM: 7.6 mg/dL — AB (ref 8.9–10.3)
CO2: 27 mmol/L (ref 22–32)
Chloride: 105 mmol/L (ref 98–111)
Creatinine, Ser: 2.07 mg/dL — ABNORMAL HIGH (ref 0.61–1.24)
GFR calc Af Amer: 34 mL/min — ABNORMAL LOW (ref >60)
GFR, EST NON AFRICAN AMERICAN: 29 mL/min — AB (ref >60)
Glucose, Bld: 86 mg/dL (ref 70–99)
POTASSIUM: 3.1 mmol/L — AB (ref 3.5–5.1)
SODIUM: 140 mmol/L (ref 135–145)

## 2018-01-28 LAB — CBC
HEMATOCRIT: 20.3 % — AB (ref 40.0–52.0)
Hemoglobin: 6.6 g/dL — ABNORMAL LOW (ref 13.0–18.0)
MCH: 31.4 pg (ref 26.0–34.0)
MCHC: 32.5 g/dL (ref 32.0–36.0)
MCV: 96.8 fL (ref 80.0–100.0)
Platelets: 193 10*3/uL (ref 150–440)
RBC: 2.09 MIL/uL — ABNORMAL LOW (ref 4.40–5.90)
RDW: 19 % — AB (ref 11.5–14.5)
WBC: 5.2 10*3/uL (ref 3.8–10.6)

## 2018-01-28 LAB — GLUCOSE, CAPILLARY
GLUCOSE-CAPILLARY: 73 mg/dL (ref 70–99)
Glucose-Capillary: 96 mg/dL (ref 70–99)
Glucose-Capillary: 139 mg/dL — ABNORMAL HIGH (ref 70–99)

## 2018-01-28 LAB — PREPARE RBC (CROSSMATCH)

## 2018-01-28 LAB — HIV ANTIBODY (ROUTINE TESTING W REFLEX): HIV Screen 4th Generation wRfx: NONREACTIVE

## 2018-01-28 MED ORDER — SODIUM CHLORIDE 0.9% IV SOLUTION: Freq: Once | INTRAVENOUS | Status: DC

## 2018-01-28 MED ORDER — POTASSIUM CHLORIDE CRYS ER 20 MEQ PO TBCR
40.0000 meq | EXTENDED_RELEASE_TABLET | Freq: Once | ORAL | Status: AC
  Administered 2018-01-28: 40 meq via ORAL
  Filled 2018-01-28: qty 2

## 2018-01-28 MED ORDER — EPOETIN ALFA 10000 UNIT/ML IJ SOLN
10000.0000 [IU] | INTRAMUSCULAR | Status: DC
  Administered 2018-01-28: 10000 [IU] via INTRAVENOUS

## 2018-01-28 NOTE — Progress Notes (Signed)
Pre HD assessment   01/28/18 0855  Vital Signs  Temp 97.8 F (36.6 C)  Temp Source Oral  Pulse Rate 90  Pulse Rate Source Monitor  Resp 15  BP 138/70  BP Location Left Arm  BP Method Automatic  Patient Position (if appropriate) Sitting  Oxygen Therapy  SpO2 97 %  O2 Device Room Air  Pain Assessment  Pain Scale 0-10  Pain Score 9  Pain Type Acute pain  Pain Location Toe (Comment which one) (big toe)  Pain Orientation Left  Pain Intervention(s) RN made aware  Dialysis Weight  Weight 81 kg  Type of Weight Pre-Dialysis  Estimated Dry Weight  (pt in chair unable to stand on scale )  Time-Out for Hemodialysis  What Procedure? HD  Pt Identifiers(min of two) First/Last Name;MRN/Account#  Correct Site? Yes  Correct Side? Yes  Correct Procedure? Yes  Consents Verified? Yes  Rad Studies Available? N/A  Safety Precautions Reviewed? Yes  Engineer, civil (consulting) Number  (1A)  Station Number 1  UF/Alarm Test Passed  Conductivity: Meter 14.2  Conductivity: Machine  14  pH 7.6  Reverse Osmosis main  Normal Saline Lot Number [id number]  Dialyzer Lot Number [id number]  Disposable Set Lot Number [id number]  Machine Temperature 98.6 F (37 C)  Musician and Audible Yes  Blood Lines Intact and Secured Yes  Pre Treatment Patient Checks  Vascular access used during treatment Catheter  Patient is receiving dialysis in a chair Yes  Hepatitis B Surface Antigen Results Negative  Date Hepatitis B Surface Antigen Drawn 01/22/18  Hepatitis B Surface Antibody  (<10)  Date Hepatitis B Surface Antibody Drawn 01/22/18  Hemodialysis Consent Verified Yes  Hemodialysis Standing Orders Initiated Yes  ECG (Telemetry) Monitor On Yes  Prime Ordered Normal Saline  Length of  DialysisTreatment -hour(s) 3.5 Hour(s)  Dialyzer Elisio 17H NR  Dialysate 4K (last potassium 3.1, MD aware)  Dialysis Anticoagulant None  Dialysate Flow Ordered 600  Blood Flow Rate Ordered 400 mL/min  Ultrafiltration  Goal 0.5 Liters  Pre Treatment Labs Type & Screen  Dialysis Blood Pressure Support Ordered Normal Saline  Education / Care Plan  Dialysis Education Provided Yes  Documented Education in Care Plan Yes  Hemodialysis Catheter Right Subclavian  Placement Date/Time: (c) (c)   Placed prior to admission: No  Orientation: Right  Access Location: Subclavian  Site Condition No complications  Purple Lumen Status N/A  Dressing Type Biopatch  Dressing Status Clean;Dry;Intact  Drainage Description None

## 2018-01-28 NOTE — Progress Notes (Signed)
I unit of blood to be given during dialysis, Blood administration start    01/28/18 1100  Vital Signs  Temp 97.8 F (36.6 C)  Temp Source Oral  Pulse Rate 72  Pulse Rate Source Monitor  Resp 17  BP 139/72  BP Location Left Arm  BP Method Automatic  Patient Position (if appropriate) Sitting  Oxygen Therapy  SpO2 99 %  O2 Device Room Air  During Hemodialysis Assessment  Blood Flow Rate (mL/min) 400 mL/min  Arterial Pressure (mmHg) -160 mmHg  Venous Pressure (mmHg) 100 mmHg  Transmembrane Pressure (mmHg) 40 mmHg  Ultrafiltration Rate (mL/min) 490 mL/min  Dialysate Flow Rate (mL/min) 600 ml/min  Conductivity: Machine  13.6  HD Safety Checks Performed Yes  Intra-Hemodialysis Comments Progressing as prescribed (603)

## 2018-01-28 NOTE — Progress Notes (Signed)
PT Cancellation Note  Patient Details Name: Gregory Tyler MRN: [id number] DOB: February 17, 1941   Cancelled Treatment:    Reason Eval/Treat Not Completed: Other (comment).  Pt's Hgb noted to be 6.6 this morning (per chart plan for blood transfusion).  Pt currently off unit at dialysis.  Will re-attempt PT treatment session at a later date/time.  Leitha Bleak, PT 01/28/18, 11:37 AM [phone]

## 2018-01-28 NOTE — Progress Notes (Signed)
HD tx start    01/28/18 0902  Vital Signs  Pulse Rate 100  Pulse Rate Source Monitor  Resp 16  BP 130/66  BP Location Left Arm  BP Method Automatic  Patient Position (if appropriate) Sitting  Oxygen Therapy  SpO2 96 %  O2 Device Room Air  During Hemodialysis Assessment  Blood Flow Rate (mL/min) 400 mL/min  Arterial Pressure (mmHg) -150 mmHg  Venous Pressure (mmHg) 110 mmHg  Transmembrane Pressure (mmHg) 40 mmHg  Ultrafiltration Rate (mL/min) 290 mL/min  Dialysate Flow Rate (mL/min) 600 ml/min  Conductivity: Machine  14.2  HD Safety Checks Performed Yes  Dialysis Fluid Bolus Normal Saline  Bolus Amount (mL) 250 mL  Intra-Hemodialysis Comments Tx initiated  Hemodialysis Catheter Right Subclavian  Placement Date/Time: (c) (c)   Placed prior to admission: No  Orientation: Right  Access Location: Subclavian  Blue Lumen Status Infusing  Red Lumen Status Infusing

## 2018-01-28 NOTE — Progress Notes (Signed)
Pre HD assessment    01/28/18 0856  Neurological  Level of Consciousness Alert  Orientation Level Oriented X4  Respiratory  Respiratory Pattern Regular;Unlabored  Chest Assessment Chest expansion symmetrical  Cardiac  Pulse Irregular  ECG Monitor Yes  Cardiac Rhythm ST;Other (Comment) (SV Rhythm )  Vascular  R Radial Pulse +2  L Radial Pulse +2  Edema Generalized;Right upper extremity;Left upper extremity;Right lower extremity;Left lower extremity  Integumentary  Integumentary (WDL) X  Skin Color Pale;Appropriate for ethnicity  Musculoskeletal  Musculoskeletal (WDL) X  Generalized Weakness Yes  Assistive Device None  GU Assessment  Genitourinary (WDL) X  Genitourinary Symptoms  (HD)  Psychosocial  Psychosocial (WDL) WDL

## 2018-01-28 NOTE — Progress Notes (Signed)
Pt received 1 unit of blood without incident, blood administration end    01/28/18 1200  Vital Signs  Temp 98.1 F (36.7 C)  Temp Source Oral  Pulse Rate 64  Pulse Rate Source Monitor  Resp 12  BP (!) 142/64  BP Location Left Arm  BP Method Automatic  Patient Position (if appropriate) Sitting  Oxygen Therapy  SpO2 98 %  O2 Device Room Air  During Hemodialysis Assessment  Blood Flow Rate (mL/min) 400 mL/min  Arterial Pressure (mmHg) -170 mmHg  Venous Pressure (mmHg) 110 mmHg  Transmembrane Pressure (mmHg) 40 mmHg  Ultrafiltration Rate (mL/min) 490 mL/min  Dialysate Flow Rate (mL/min) 600 ml/min  Conductivity: Machine  14  HD Safety Checks Performed Yes  Dialysis Fluid Bolus Blood Products  Bolus Amount (mL) 300 mL  Intra-Hemodialysis Comments Progressing as prescribed (1030)

## 2018-01-28 NOTE — Progress Notes (Signed)
HD tx end   01/28/18 1234  Vital Signs  Pulse Rate 69  Pulse Rate Source Monitor  Resp 16  BP 131/66  BP Location Left Arm  BP Method Automatic  Patient Position (if appropriate) Sitting  Oxygen Therapy  SpO2 99 %  O2 Device Room Air  During Hemodialysis Assessment  Dialysis Fluid Bolus Normal Saline  Bolus Amount (mL) 250 mL  Intra-Hemodialysis Comments Tx completed

## 2018-01-28 NOTE — Progress Notes (Signed)
Post HD assessment   01/28/18 1235  Neurological  Level of Consciousness Alert  Orientation Level Oriented X4  Respiratory  Respiratory Pattern Regular;Unlabored  Chest Assessment Chest expansion symmetrical  Cardiac  Pulse Irregular  ECG Monitor Yes  Cardiac Rhythm SB;NSR  Vascular  R Radial Pulse +2  L Radial Pulse +2  Edema Generalized;Right upper extremity;Left upper extremity;Right lower extremity;Left lower extremity  Integumentary  Integumentary (WDL) X  Skin Color Pale;Appropriate for ethnicity  Musculoskeletal  Musculoskeletal (WDL) X  Generalized Weakness Yes  Assistive Device None  GU Assessment  Genitourinary (WDL) X  Genitourinary Symptoms  (HD)  Psychosocial  Psychosocial (WDL) WDL  Patient Behaviors Cooperative;Calm  Needs Expressed Physical  Emotional support given Given to patient

## 2018-01-28 NOTE — Progress Notes (Signed)
OT Cancellation Note  Patient Details Name: Gregory Tyler MRN: [id number] DOB: [birth year]   Cancelled Treatment:    Reason Eval/Treat Not Completed: Patient at procedure or test/ unavailable. Pt unavailable for OT tx, receiving HD. Will re-attempt at later date/time as pt is available and medically appropriate.   Jeni Salles, MPH, MS, OTR/L ascom [phone] 01/28/18, 10:21 AM

## 2018-01-28 NOTE — Progress Notes (Signed)
[facility]  ROUNDING NOTE   Subjective:   Hemoglobin 6.6 - PRBC transfusion   Seen and examined on hemodialysis. Tolerating treatment well.     HEMODIALYSIS FLOWSHEET:  Blood Flow Rate (mL/min): 400 mL/min Arterial Pressure (mmHg): -160 mmHg Venous Pressure (mmHg): 110 mmHg Transmembrane Pressure (mmHg): 40 mmHg Ultrafiltration Rate (mL/min): 490 mL/min Dialysate Flow Rate (mL/min): 600 ml/min Conductivity: Machine : 14.1 Conductivity: Machine : 14.1 Dialysis Fluid Bolus: Normal Saline Bolus Amount (mL): 250 mL Dialysate Change: 4K    Objective:  Vital signs in last 24 hours:  Temp:  [97.6 F (36.4 C)-98.6 F (37 C)] 98.2 F (36.8 C) (10/08 1115) Pulse Rate:  [65-100] 70 (10/08 1115) Resp:  [10-18] 16 (10/08 1115) BP: (101-140)/(60-88) 140/70 (10/08 1115) SpO2:  [95 %-100 %] 98 % (10/08 1115) Weight:  [81 kg] 81 kg (10/08 0855)  Weight change:  Filed Weights   01/25/18 1256 01/25/18 1634 01/28/18 0855  Weight: 81.6 kg 81 kg 81 kg    Intake/Output: I/O last 3 completed shifts: In: 600 [P.O.:600] Out: -    Intake/Output this shift:  No intake/output data recorded.  Physical Exam: General: NAD,   Head: Normocephalic, atraumatic. Moist oral mucosal membranes  Eyes: Anicteric, PERRL  Neck: Supple, trachea midline  Lungs:  Clear to auscultation  Heart: Regular rate and rhythm  Abdomen:  Soft, nontender,   Extremities: Trace peripheral edema.  Neurologic: Nonfocal, moving all four extremities  Skin: No lesions  Access: RIJ permcath    Basic Metabolic Panel: Recent Labs  Lab 01/22/18 2348  01/23/18 1906 01/24/18 0043 01/24/18 2032 01/25/18 0411 01/25/18 1320 01/26/18 0741 01/27/18 0501 01/28/18 0639  NA  --    < >  --  140  --  142  --  140 140 140  K  --    < >  --  4.0  --  3.4*  --  3.2* 4.0 3.1*  CL  --    < >  --  114*  --  110  --  103 104 105  CO2  --    < >  --  18*  --  27  --  29 28 27   GLUCOSE  --    < >  --  91  --   124*  --  91 95 86  BUN  --    < >  --  59*  --  25*  --  20 26* 33*  CREATININE  --    < >  --  3.74*  --  1.72*  --  1.60* 2.16* 2.07*  CALCIUM  --    < >  --  7.9*  --  7.7*  --  7.6* 7.7* 7.6*  PHOS 4.4  --  4.2  --  2.9  --  2.6  --   --   --    < > = values in this interval not displayed.    Liver Function Tests: Recent Labs  Lab 01/23/18 0447 01/25/18 0411  AST 86* 70*  ALT 127* 58*  ALKPHOS 194* 170*  BILITOT 0.5 0.4  PROT  --  4.5*  ALBUMIN  --  1.8*   No results for input(s): LIPASE, AMYLASE in the last 168 hours. No results for input(s): AMMONIA in the last 168 hours.  CBC: Recent Labs  Lab 01/22/18 0341 01/23/18 0447 01/28/18 0639  WBC 8.2 7.3 5.2  HGB 7.4* 7.8* 6.6*  HCT 22.1* 22.6* 20.3*  MCV 96.9  94.0 96.8  PLT 191 162 193    Cardiac Enzymes: No results for input(s): CKTOTAL, CKMB, CKMBINDEX, TROPONINI in the last 168 hours.  BNP: Invalid input(s): POCBNP  CBG: Recent Labs  Lab 01/27/18 1151 01/27/18 1314 01/27/18 1643 01/27/18 2135 01/28/18 0756  GLUCAP 140* 200* 125* 156* 67    Microbiology: Results for orders placed or performed during the hospital encounter of 01/13/18  Blood culture (routine x 2)     Status: None   Collection Time: 01/13/18  1:39 PM  Result Value Ref Range Status   Specimen Description RIGHT ANTECUBITAL  Final   Special Requests   Final    BOTTLES DRAWN AEROBIC AND ANAEROBIC Blood Culture adequate volume   Culture   Final    NO GROWTH 5 DAYS Performed at [facility], [address] [place], [place] [address]    Report Status 01/18/2018 FINAL  Final  Blood culture (routine x 2)     Status: None   Collection Time: 01/13/18  1:39 PM  Result Value Ref Range Status   Specimen Description LEFT ANTECUBITAL  Final   Special Requests   Final    BOTTLES DRAWN AEROBIC AND ANAEROBIC Blood Culture results may not be optimal due to an excessive volume of blood received in culture bottles   Culture   Final     NO GROWTH 5 DAYS Performed at [facility], [address] [place], [place] [address]    Report Status 01/18/2018 FINAL  Final  Urine Culture     Status: Abnormal   Collection Time: 01/13/18  1:39 PM  Result Value Ref Range Status   Specimen Description   Final    URINE, RANDOM Performed at [facility], [address] [place], [place] [address]    Special Requests   Final    NONE Performed at [facility], [address], [place], [place] [address]    Culture >=100,000 COLONIES/mL PSEUDOMONAS AERUGINOSA (A)  Final   Report Status 01/16/2018 FINAL  Final   Organism ID, Bacteria PSEUDOMONAS AERUGINOSA (A)  Final      Susceptibility   Pseudomonas aeruginosa - MIC*    CEFTAZIDIME 4 SENSITIVE Sensitive     CIPROFLOXACIN <=0.25 SENSITIVE Sensitive     GENTAMICIN <=1 SENSITIVE Sensitive     IMIPENEM 1 SENSITIVE Sensitive     PIP/TAZO <=4 SENSITIVE Sensitive     CEFEPIME 2 SENSITIVE Sensitive     * >=100,000 COLONIES/mL PSEUDOMONAS AERUGINOSA  MRSA PCR Screening     Status: None   Collection Time: 01/13/18  5:13 PM  Result Value Ref Range Status   MRSA by PCR NEGATIVE NEGATIVE Final    Comment:        The GeneXpert MRSA Assay (FDA approved for NASAL specimens only), is one component of a comprehensive MRSA colonization surveillance program. It is not intended to diagnose MRSA infection nor to guide or monitor treatment for MRSA infections. Performed at [facility], [address] [place], [place] [address]     Coagulation Studies: No results for input(s): LABPROT, INR in the last 72 hours.  Urinalysis: No results for input(s): COLORURINE, LABSPEC, PHURINE, GLUCOSEU, HGBUR, BILIRUBINUR, KETONESUR, PROTEINUR, UROBILINOGEN, NITRITE, LEUKOCYTESUR in the last 72 hours.  Invalid input(s): APPERANCEUR    Imaging: No results found.   Medications:    . sodium chloride   Intravenous Once  . allopurinol  100 mg Oral Daily  .  apixaban  2.5 mg Oral BID  .  atorvastatin  10 mg Oral q1800  . Chlorhexidine Gluconate Cloth  6 each Topical Q0600  . insulin aspart  0-9 Units Subcutaneous TID WC  . levothyroxine  50 mcg Oral QAC breakfast  . midodrine  10 mg Oral TID WC  . multivitamin  1 tablet Oral QHS  . multivitamin-lutein  1 capsule Oral Daily  . OLANZapine  2.5 mg Oral QHS  . povidone-iodine   Topical BID  . senna-docusate  2 tablet Oral BID  . tuberculin  5 Units Intradermal Once  . vitamin C  500 mg Oral BID   loperamide, [DISCONTINUED] ondansetron **OR** ondansetron (ZOFRAN) IV, ondansetron (ZOFRAN) IV, oxyCODONE-acetaminophen, polyethylene glycol  Assessment/ Plan:  [name] is a 77 y.o. white male with chronic systolic congestive heart failure, diabetes mellitus type 2, atrial fibrillation, hypertension, history of gastric ulcer, coronary disease, who was admitted to [facility] on9/23/2019for evaluation of altered mental status.  1.  Acute renal failure on chronic kidney disease stage III Baseline creatinine 1.30/GFR 51 from December 20, 2017 Acute renal failure appears to be multifactorial including hypotension, sepsis, likely leading to ATN. Renal u/s shows left non obstructing stone 5 mm; no hydronephrosis Requiring hemodialysis treatment today Outpatient planning for [facility].  Will consider holding dialysis if renal function remains stable.    2. Latent TB: with elevated LFTs - no indication for treatment at this time - Appreciate ID input.   3.  UTI and sepsis, Pseudomonas aeuroginosa Treated with cefepime.  4. Anemia of chronic kidney disease.  Hemoglobin 6.6 - PRBC 1 unit transfusion with HD treatment - EPO with HD treatment  5. Hypotension: blood pressure at goal, 140/70 - midodrine .    LOS: [place] 10/8/201911:32 AM

## 2018-01-28 NOTE — Progress Notes (Signed)
Post HD assessment. Pt tolerated tx well without c/o or complication.Pt received 1 unit of blood, without incident, during HD tx. Net UF 510, goal met.    01/28/18 1237  Vital Signs  Temp 98.3 F (36.8 C)  Temp Source Oral  Pulse Rate 70  Pulse Rate Source Monitor  Resp 17  BP (!) 151/72  BP Location Left Arm  BP Method Automatic  Patient Position (if appropriate) Lying  Oxygen Therapy  SpO2 99 %  O2 Device Room Air  Dialysis Weight  Weight 80.5 kg  Type of Weight Post-Dialysis (pt unable to stand on scale)  Post-Hemodialysis Assessment  Rinseback Volume (mL) 250 mL  KECN 80.7 V  Dialyzer Clearance Lightly streaked  Duration of HD Treatment -hour(s) 3.5 hour(s)  Hemodialysis Intake (mL) 800 mL  UF Total -Machine (mL) 1310 mL  Net UF (mL) 510 mL  Tolerated HD Treatment Yes  Education / Care Plan  Dialysis Education Provided Yes  Documented Education in Care Plan Yes  Hemodialysis Catheter Right Subclavian  Placement Date/Time: (c) (c)   Placed prior to admission: No  Orientation: Right  Access Location: Subclavian  Site Condition No complications  Blue Lumen Status Heparin locked  Red Lumen Status Heparin locked  Purple Lumen Status N/A  Catheter fill solution Heparin 1000 units/ml  Catheter fill volume (Arterial) 1.5 cc  Catheter fill volume (Venous) 1.5  Dressing Type Biopatch  Dressing Status Clean;Dry;Intact  Drainage Description None  Post treatment catheter status Capped and Clamped

## 2018-01-28 NOTE — Progress Notes (Signed)
[facility] NAME: Gregory Tyler    MR#:  [id number]  DATE OF BIRTH:  [birth year]  SUBJECTIVE:  Patient with no new complaints.  Had hemodialysis today    Tolerating hemodialysis well.  REVIEW OF SYSTEMS:   Review of Systems  Constitutional: Negative for chills, fever and weight loss.  HENT: Negative for ear discharge, ear pain and nosebleeds.   Eyes: Negative for blurred vision, pain and discharge.  Respiratory: Negative for sputum production, shortness of breath, wheezing and stridor.   Cardiovascular: Negative for chest pain, palpitations, orthopnea and PND.  Gastrointestinal: Negative for abdominal pain, diarrhea, nausea and vomiting.  Genitourinary: Negative for frequency and urgency.  Musculoskeletal: Negative for back pain and joint pain.  Neurological: Positive for weakness. Negative for sensory change, speech change and focal weakness.  Psychiatric/Behavioral: Negative for depression and hallucinations. The patient is not nervous/anxious.    Tolerating Diet:yes Tolerating PT: snf  DRUG ALLERGIES:   Allergies  Allergen Reactions  . Aspirin     GI Bleeding  . Codeine Other (See Comments)    BLISTERS Other reaction(s): Other (See Comments) BLISTERS   . Codeine Sulfate Other (See Comments)    Other reaction(s): Other (See Comments) BLISTERS BLISTERS    VITALS:  Blood pressure (!) 115/57, pulse 69, temperature 98.2 F (36.8 C), temperature source Oral, resp. rate 18, height 5\' 8"  (1.727 m), weight 80.5 kg, SpO2 98 %.  PHYSICAL EXAMINATION:   Physical Exam  GENERAL:  77 y.o.-year-old patient lying in the bed with no acute distress.  EYES: Pupils equal, round, reactive to light and accommodation. No scleral icterus. Extraocular muscles intact. PALLOR+ HEENT: Head atraumatic, normocephalic. Oropharynx and nasopharynx clear.  NECK:  Supple, no jugular venous distention. No thyroid enlargement, no  tenderness.  LUNGS: Normal breath sounds bilaterally, no wheezing, rales, rhonchi. No use of accessory muscles of respiration.  CARDIOVASCULAR: S1, S2 normal. No murmurs, rubs, or gallops.  ABDOMEN: Soft, nontender, nondistended. Bowel sounds present. No organomegaly or mass.  EXTREMITIES: No cyanosis, clubbing or edema b/l.   Bilateral lower extremity wounds chronic. NEUROLOGIC: Cranial nerves II through XII are intact. No focal Motor or sensory deficits b/l. Feeling weak PSYCHIATRIC:  patient is alert and oriented x 3.  SKIN: No obvious rash, lesion, or ulcer.   LABORATORY PANEL:  CBC Recent Labs  Lab 01/28/18 0639  WBC 5.2  HGB 6.6*  HCT 20.3*  PLT 193    Chemistries  Recent Labs  Lab 01/25/18 0411  01/28/18 0639  NA 142   < > 140  K 3.4*   < > 3.1*  CL 110   < > 105  CO2 27   < > 27  GLUCOSE 124*   < > 86  BUN 25*   < > 33*  CREATININE 1.72*   < > 2.07*  CALCIUM 7.7*   < > 7.6*  AST 70*  --   --   ALT 58*  --   --   ALKPHOS 170*  --   --   BILITOT 0.4  --   --    < > = values in this interval not displayed.   Cardiac Enzymes No results for input(s): TROPONINI in the last 168 hours. RADIOLOGY:  No results found. 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 chair as per my discussion with the nephrology Dr. Sharion Settler days Tuesday, Thursday and Saturday As per my discussion with Dr. Juleen China -he might consider holding dialysis if renal function remains stable.  Repeat a.m. 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 PPD was  not done since patient had developed severe reactions/cellulitis in the remote past according to the patient. I have discussed with on-call infectious disease Dr. Steva Ready , given pts  elevated LFTs not considering INH treatment at this time.   Chest x-ray negative.  No active tuberculosis elevated transaminases is a contraindication for INH treatment HIV screening test is negative okay to discharge patient from ID standpoint,   *Acute encephalopathy due to sepsis Resolved  *Acute septic shock due to Pseudomonas UTI  Resolved Successfully weaned off pressors Treated with 6-day course of cefepime-discontinued on January 21, 2018  *Acute transaminitis  Resolving Suspected due to sepsis/acute shock liver GI did see patient while in house-no intervention recommended, continue to avoid hepatotoxic agents, hepatic panel negative - abdominal ultrasound per radiology noted for fatty liver/hepatocellular disease    *Chronic systolic CHF without exacerbation repeat echo shows normal EF Stable on current regimen  *Chronic diabetes mellitus type 2 Stable on current regiment  *Hypertension  Normotensive without antihypertensive medications   *Paroxysmal atrial fibrillation  Discontinue heparin drip, started Eliquis   Disposition-[facility] is from [facility]. He is a long-term resident.   CODE STATUS: dnr   Case discussed with Care Management/Social Worker. Management plans discussed with the patient, family and they are in agreement.    DVT Prophylaxis: eliquis  TOTAL TIME TAKING CARE OF THIS PATIENT: *30* minutes.  >50% time spent on counselling and coordination of care  POSSIBLE D/C IN **1-2DAYS, DEPENDING ON CLINICAL CONDITION.  Note: This dictation was prepared with Dragon dictation along with smaller phrase technology. Any transcriptional errors that result from this process are unintentional.  [name] M.D on 01/28/2018 at 11:23 PM  Between 7am to 6pm - Pager -  [phone]  After 6pm go to [email or web address] - password [email or web address] [facility]  Office  [phone]  CC: Primary care physician; [name], MDPatient ID: [name], male   DOB: 1940-06-21, 77 y.o.   MRN: [id number]

## 2018-01-29 LAB — GLUCOSE, CAPILLARY
GLUCOSE-CAPILLARY: 109 mg/dL — AB (ref 70–99)
GLUCOSE-CAPILLARY: 115 mg/dL — AB (ref 70–99)
Glucose-Capillary: 159 mg/dL — ABNORMAL HIGH (ref 70–99)
Glucose-Capillary: 73 mg/dL (ref 70–99)
Glucose-Capillary: 185 mg/dL — ABNORMAL HIGH (ref 70–99)

## 2018-01-29 LAB — BPAM RBC
Blood Product Expiration Date: 201910302359
ISSUE DATE / TIME: 201910081053
Unit Type and Rh: 5100

## 2018-01-29 LAB — TYPE AND SCREEN
ABO/RH(D): O POS
ANTIBODY SCREEN: NEGATIVE
UNIT DIVISION: 0

## 2018-01-29 LAB — BASIC METABOLIC PANEL
Anion gap: 6 (ref 5–15)
BUN: 18 mg/dL (ref 8–23)
CALCIUM: 7.6 mg/dL — AB (ref 8.9–10.3)
CO2: 27 mmol/L (ref 22–32)
CREATININE: 1.28 mg/dL — AB (ref 0.61–1.24)
Chloride: 108 mmol/L (ref 98–111)
GFR calc non Af Amer: 52 mL/min — ABNORMAL LOW (ref >60)
GLUCOSE: 89 mg/dL (ref 70–99)
Potassium: 3.9 mmol/L (ref 3.5–5.1)
Sodium: 141 mmol/L (ref 135–145)

## 2018-01-29 LAB — HEMOGLOBIN AND HEMATOCRIT, BLOOD
HCT: 27.3 % — ABNORMAL LOW (ref 39.0–52.0)
Hemoglobin: 8.5 g/dL — ABNORMAL LOW (ref 13.0–17.0)

## 2018-01-29 MED ORDER — HYDROCODONE-ACETAMINOPHEN 5-325 MG PO TABS: 1.0000 | ORAL_TABLET | Freq: Four times a day (QID) | ORAL | 0 refills | Status: DC | PRN

## 2018-01-29 NOTE — Progress Notes (Signed)
CPT completed, pt placed on right side in trendelenburg. Percussor used. CPT productive moderate for moderate amount of clear thin sputum. Specimen sent to lab for AFB.

## 2018-01-29 NOTE — Clinical Social Work Note (Signed)
CSW received a call from [place] [facility] saying that their DON has spoken to their corporate and they will not take patient back due to the latent TB. CSW spoke with the administrator of [facility] and he provided the name and number of the corporate rep who made the final decision. CSW contacted [name]: [phone] and she was willing to speak with our Infectious Disease physician: Dr. Ramon Dredge. CSW spoke with [name] after the conversation and she stated that [facility] would take patient but would have to take him in the morning as their pharmacy was closed. CSW has notified patient's son and the attending physician. Shela Leff MSW,LCSW [phone]

## 2018-01-29 NOTE — Care Management (Signed)
Per Dr. Juleen China patient's outpatient HD seat has been cancelled, and he will follow up outpatient with nephrology.  Dr. Juleen China to notify HD liaison Elvera Bicker.  Patient to return to SNF.  CSW facilitating.

## 2018-01-29 NOTE — Discharge Summary (Signed)
[facility] NAME: Gregory Tyler    MR#:  [id number]  DATE OF BIRTH:  04/03/1941  DATE OF ADMISSION:  01/13/2018 ADMITTING PHYSICIAN: Dustin Flock, MD  DATE OF DISCHARGE: 01/29/2018  PRIMARY CARE PHYSICIAN: [name], MD   ADMISSION DIAGNOSIS:  Sepsis, due to unspecified organism [A41.9] Urinary tract infection without hematuria, site unspecified [N39.0] Acute renal failure, unspecified acute renal failure type ([place]) [N17.9] Altered mental status, unspecified altered mental status type [R41.82] Sepsis ([place]) [A41.9]  DISCHARGE DIAGNOSIS:  Active Problems:   Weakness generalized   Sepsis ([place])   Acute renal failure superimposed on chronic kidney disease ([place])   Palliative care by specialist   DNR (do not resuscitate)   Protein-calorie malnutrition, severe CKD stage III Acute metabolic encephalopathy Septic shock secondary to Pseudomonas UTI Pseudomonas urinary tract infection Shock liver  SECONDARY DIAGNOSIS:   Past Medical History:  Diagnosis Date  . Anemia   . Anxiety   . Arthritis   . Cardiomyopathy ([place])    a. 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  Marland Kitchen Chronic combined systolic (congestive) and diastolic (congestive) heart failure ([place])    a. 05/2015 TEE: EF 35-40% (in setting of Afib).  . Chronic Pelvic pain   . Coronary artery disease    a. 05/2015 CTA Chest: 3 vessel coronary Ca2+.  . Depression   . Diabetes mellitus without complication ([place])   . Emphysema lung ([place])   . Gastric ulcer   . Gout   . Hyperlipidemia   . Hypertension   . Nephrolithiasis   . PAF (paroxysmal atrial fibrillation) ([place])    a. 05/2015 s/p DCCV; b. 03/2016 s/p DCCV; c. CHA2DS2VASc = 6 (coumadin).  . Prostate cancer [facility])    Prostate  . Stage III chronic kidney disease ([place])   . Weight loss    50 lb since Apr 24, 2015     ADMITTING HISTORY Gregory Tyler  is a 77 y.o. male with a known history of  chronic systolic CHF, diabetes type 2, paroxysmal atrial fibrillation, coronary artery disease, gastric ulcer, hypertension, hyperlipidemia who currently resides in a skilled nursing facility brought from there for altered mental status.  Patient son states that his father was in usual state of health yesterday however today he was confused and lethargic.  Patient noted to be hypotensive, noted to have acute renal failure, noted to have elevated liver function test.  Also noticed to have UTI.  HOSPITAL COURSE:  Patient was admitted to medical floor started on broad-spectrum antibiotics for sepsis secondary to urinary tract infection.  Patient had a prolonged stay in the hospital.  Patient had abnormal LFTs secondary to shock liver.  Patient was evaluated by infectious disease, gastroenterology and nephrology during the stay in the hospital.  Patient was worked up with CT head renal renal ultrasound during the hospitalization.  Urine culture grew Pseudomonas and was treated with IV cefepime antibiotic and completed a course of antibiotics.  Patient also received PRBC transfusion for anemia.  He was dialyzed for chronic kidney disease and followed by nephrology closely.  His creatinine improved and was 1.28 at the time of discharge.  Nephrology did not recommend continuous dialysis and dialysis has been stopped.  He had a QuantiFERON test done which was positive but bandage could not be given secondary to abnormal LFTs.  INH therapy was deferred and patient was seen by infectious disease attending during the stay in the  hospital.  Patient is metabolic encephalopathy completely resolved.  His renal function improved.  His hemoglobin has been stable after PRBC transfusion.  MRSA PCR was negative.  Culture blood did not reveal any growth.  Patient tolerated IV antibiotics well.  Will be discharged to [facility].  CONSULTS OBTAINED:  Treatment Team:  Murlean Iba, MD Tsosie Billing,  MD Gastroenterology consultation DRUG ALLERGIES:   Allergies  Allergen Reactions  . Aspirin     GI Bleeding  . Codeine Other (See Comments)    BLISTERS Other reaction(s): Other (See Comments) BLISTERS   . Codeine Sulfate Other (See Comments)    Other reaction(s): Other (See Comments) BLISTERS BLISTERS    DISCHARGE MEDICATIONS:   Allergies as of 01/29/2018      Reactions   Aspirin    GI Bleeding   Codeine Other (See Comments)   BLISTERS Other reaction(s): Other (See Comments) BLISTERS   Codeine Sulfate Other (See Comments)   Other reaction(s): Other (See Comments) BLISTERS BLISTERS      Medication List    STOP taking these medications   furosemide 20 MG tablet Commonly known as:  LASIX   lisinopril 5 MG tablet Commonly known as:  PRINIVIL,ZESTRIL   metFORMIN 500 MG tablet Commonly known as:  GLUCOPHAGE   metoprolol tartrate 50 MG tablet Commonly known as:  LOPRESSOR     TAKE these medications   acetaminophen 325 MG tablet Commonly known as:  TYLENOL Take 2 tablets (650 mg total) by mouth every 6 (six) hours as needed for mild pain (or Fever >/= 101).   allopurinol 300 MG tablet Commonly known as:  ZYLOPRIM Take 300 mg by mouth daily.   ELIQUIS 2.5 MG Tabs tablet Generic drug:  apixaban Take 2.5 mg by mouth every 12 (twelve) hours.   feeding supplement (ENSURE ENLIVE) Liqd Take 237 mLs by mouth 2 (two) times daily between meals.   gabapentin 100 MG capsule Commonly known as:  NEURONTIN Take 1 capsule (100 mg total) by mouth at bedtime.   HYDROcodone-acetaminophen 5-325 MG tablet Commonly known as:  NORCO/VICODIN Take 1 tablet by mouth every 6 (six) hours as needed for moderate pain or severe pain. What changed:    when to take this  reasons to take this   magnesium oxide 400 (241.3 Mg) MG tablet Commonly known as:  MAG-OX Take 400 mg by mouth daily.   magnesium oxide 400 MG tablet Commonly known as:  MAG-OX Take 400 mg by mouth  daily.   Medical Compression Thigh High Misc 1 application by Does not apply route daily.   rosuvastatin 20 MG tablet Commonly known as:  CRESTOR Take 20 mg by mouth daily.       Today  Patient seen and evaluated on the day of discharge No complaints of any chest pain, shortness of breath No fever and chills Tolerating diet well  VITAL SIGNS:  Blood pressure (!) 105/54, pulse 91, temperature 97.9 F (36.6 C), temperature source Oral, resp. rate 16, height 5\' 8"  (1.727 m), weight 80.5 kg, SpO2 97 %.  I/O:    Intake/Output Summary (Last 24 hours) at 01/29/2018 1120 Last data filed at 01/28/2018 1940 Gross per 24 hour  Intake 600 ml  Output 510 ml  Net 90 ml    PHYSICAL EXAMINATION:  Physical Exam  GENERAL:  77 y.o.-year-old patient lying in the bed with no acute distress.  LUNGS: Normal breath sounds bilaterally, no wheezing, rales,rhonchi or crepitation. No use of accessory muscles of respiration.  CARDIOVASCULAR: S1, S2 normal. No murmurs, rubs, or gallops.  ABDOMEN: Soft, non-tender, non-distended. Bowel sounds present. No organomegaly or mass.  NEUROLOGIC: Moves all 4 extremities. PSYCHIATRIC: The patient is alert and oriented x 3.  SKIN: No obvious rash, lesion, or ulcer.   DATA REVIEW:   CBC Recent Labs  Lab 01/28/18 0639 01/29/18 1043  WBC 5.2  --   HGB 6.6* 8.5*  HCT 20.3* 27.3*  PLT 193  --     Chemistries  Recent Labs  Lab 01/25/18 0411  01/29/18 0502  NA 142   < > 141  K 3.4*   < > 3.9  CL 110   < > 108  CO2 27   < > 27  GLUCOSE 124*   < > 89  BUN 25*   < > 18  CREATININE 1.72*   < > 1.28*  CALCIUM 7.7*   < > 7.6*  AST 70*  --   --   ALT 58*  --   --   ALKPHOS 170*  --   --   BILITOT 0.4  --   --    < > = values in this interval not displayed.    Cardiac Enzymes No results for input(s): TROPONINI in the last 168 hours.  Microbiology Results  Results for orders placed or performed during the hospital encounter of 01/13/18  Blood  culture (routine x 2)     Status: None   Collection Time: 01/13/18  1:39 PM  Result Value Ref Range Status   Specimen Description RIGHT ANTECUBITAL  Final   Special Requests   Final    BOTTLES DRAWN AEROBIC AND ANAEROBIC Blood Culture adequate volume   Culture   Final    NO GROWTH 5 DAYS Performed at [facility], [address], [place], [place] [address]    Report Status 01/18/2018 FINAL  Final  Blood culture (routine x 2)     Status: None   Collection Time: 01/13/18  1:39 PM  Result Value Ref Range Status   Specimen Description LEFT ANTECUBITAL  Final   Special Requests   Final    BOTTLES DRAWN AEROBIC AND ANAEROBIC Blood Culture results may not be optimal due to an excessive volume of blood received in culture bottles   Culture   Final    NO GROWTH 5 DAYS Performed at [facility], [address] [place], [place] [address]    Report Status 01/18/2018 FINAL  Final  Urine Culture     Status: Abnormal   Collection Time: 01/13/18  1:39 PM  Result Value Ref Range Status   Specimen Description   Final    URINE, RANDOM Performed at [facility], [address] [place], [place] [address]    Special Requests   Final    NONE Performed at [facility], [address] [place], [place] [address]    Culture >=100,000 COLONIES/mL PSEUDOMONAS AERUGINOSA (A)  Final   Report Status 01/16/2018 FINAL  Final   Organism ID, Bacteria PSEUDOMONAS AERUGINOSA (A)  Final      Susceptibility   Pseudomonas aeruginosa - MIC*    CEFTAZIDIME 4 SENSITIVE Sensitive     CIPROFLOXACIN <=0.25 SENSITIVE Sensitive     GENTAMICIN <=1 SENSITIVE Sensitive     IMIPENEM 1 SENSITIVE Sensitive     PIP/TAZO <=4 SENSITIVE Sensitive     CEFEPIME 2 SENSITIVE Sensitive     * >=100,000 COLONIES/mL PSEUDOMONAS AERUGINOSA  MRSA PCR Screening  Status: None   Collection Time: 01/13/18  5:13 PM  Result Value Ref Range Status   MRSA by PCR NEGATIVE NEGATIVE Final    Comment:         The GeneXpert MRSA Assay (FDA approved for NASAL specimens only), is one component of a comprehensive MRSA colonization surveillance program. It is not intended to diagnose MRSA infection nor to guide or monitor treatment for MRSA infections. Performed at [facility], [address] [place], [place] [address]     RADIOLOGY:  No results found.  Follow up with PCP in 1 week.  Management plans discussed with the patient, family and they are in agreement.  CODE STATUS: DNR    Code Status Orders  (From admission, onward)         Start     Ordered   01/20/18 1243  Do not attempt resuscitation (DNR)  Continuous    Question Answer Comment  In the event of cardiac or respiratory ARREST Do not call a "code blue"   In the event of cardiac or respiratory ARREST Do not perform Intubation, CPR, defibrillation or ACLS   In the event of cardiac or respiratory ARREST Use medication by any route, position, wound care, and other measures to relive pain and suffering. May use oxygen, suction and manual treatment of airway obstruction as needed for comfort.      01/20/18 1242        Code Status History    Date Active Date Inactive Code Status Order ID Comments User Context   01/19/2018 1329 01/20/2018 1242 Full Code [id number]  Gorden Harms, MD Inpatient   01/13/2018 1712 01/19/2018 1329 DNR [id number]  Dustin Flock, MD Inpatient   12/19/2017 1932 12/20/2017 2151 DNR [id number]  [name], MD Inpatient   11/17/2017 1357 11/19/2017 1603 DNR [id number]  Hermelinda Dellen, DO Inpatient   11/15/2017 1550 11/17/2017 1357 Full Code [id number]  Loletha Grayer, MD ED   09/11/2017 1951 09/15/2017 1603 Full Code [id number]  Bettey Costa, MD ED   05/11/2015 1307 05/13/2015 1256 Full Code [id number]  Aldean Jewett, MD Inpatient   04/25/2015 0149 04/27/2015 1741 Full Code [id number]  Hillary Bow, MD ED      TOTAL TIME TAKING CARE OF THIS PATIENT ON DAY OF DISCHARGE: more than 34 minutes.    Saundra Shelling M.D on 01/29/2018 at 11:20 AM  Between 7am to 6pm - Pager - [phone]  After 6pm go to [email or web address] - password [email or web address] [facility]  Office  [phone]  CC: Primary care physician; [name], MD  Note: This dictation was prepared with Dragon dictation along with smaller phrase technology. Any transcriptional errors that result from this process are unintentional.

## 2018-01-29 NOTE — Progress Notes (Signed)
OT Cancellation Note  Patient Details Name: Gregory Tyler MRN: [id number] DOB: February 16, 1941   Cancelled Treatment:    Reason Eval/Treat Not Completed: Medical issues which prohibited therapy(Pt. last Hgb reading was 6.6 on 01/28/2018 at 0639. Will continue to monitor, and intervene when medically appropriate.)  Harrel Carina, MS, OTR/L 01/29/2018, 10:03 AM

## 2018-01-29 NOTE — Progress Notes (Signed)
ID I Was called by case manager Monica as nursing home having an issue with taking him because of positive quantiferon gold Spoke to them He had PPD done theres as 2 step and it was negative ( Aug 30th and sept 13th) Quantiferon level is around 2- low positive Will get 3 sputums for AFB which will have to be induced It may be that he will not be able to cough up any sputum.  Rockey Situ Ms.Parker in the NH that patient does not have pulmonary TB and also even if his LFts were okay we would not have immediately treated him for latent TB with either INH or rifampin or combo of INH/rifapentine because of his age and comorbidity.  she will get back to the case manager.She will clarify whether they can take him without the afb result I am away until 02/04/18.text if needed

## 2018-01-29 NOTE — Progress Notes (Signed)
1st AFB collected as requested by Dr Herminio Heads.  Order given by Dr Estanislado Pandy to leave IV out

## 2018-01-29 NOTE — Progress Notes (Signed)
[facility]  ROUNDING NOTE   Subjective:   Hemodialysis treatment yesterday. PRBC transfusion during treatment.   Urinated 5 times over night.   Creatinine 1.28.     Objective:  Vital signs in last 24 hours:  Temp:  [97.8 F (36.6 C)-98.4 F (36.9 C)] 97.9 F (36.6 C) (10/09 0905) Pulse Rate:  [57-91] 91 (10/09 0905) Resp:  [10-18] 16 (10/09 0905) BP: (105-151)/(54-73) 105/54 (10/09 0905) SpO2:  [96 %-100 %] 97 % (10/09 0905) Weight:  [80.5 kg] 80.5 kg (10/08 1237)  Weight change:  Filed Weights   01/25/18 1634 01/28/18 0855 01/28/18 1237  Weight: 81 kg 81 kg 80.5 kg    Intake/Output: I/O last 3 completed shifts: In: 720 [P.O.:420; Blood:300] Out: 510 [Other:510]   Intake/Output this shift:  No intake/output data recorded.  Physical Exam: General: NAD,   Head: Normocephalic, atraumatic. Moist oral mucosal membranes  Eyes: Anicteric, PERRL  Neck: Supple, trachea midline  Lungs:  Clear to auscultation  Heart: Regular rate and rhythm  Abdomen:  Soft, nontender,   Extremities: No peripheral edema.  Neurologic: Nonfocal, moving all four extremities  Skin: No lesions  Access: RIJ permcath    Basic Metabolic Panel: Recent Labs  Lab 01/22/18 2348  01/23/18 1906  01/24/18 2032 01/25/18 0411 01/25/18 1320 01/26/18 0741 01/27/18 0501 01/28/18 0639 01/29/18 0502  NA  --    < >  --    < >  --  142  --  140 140 140 141  K  --    < >  --    < >  --  3.4*  --  3.2* 4.0 3.1* 3.9  CL  --    < >  --    < >  --  110  --  103 104 105 108  CO2  --    < >  --    < >  --  27  --  29 28 27 27   GLUCOSE  --    < >  --    < >  --  124*  --  91 95 86 89  BUN  --    < >  --    < >  --  25*  --  20 26* 33* 18  CREATININE  --    < >  --    < >  --  1.72*  --  1.60* 2.16* 2.07* 1.28*  CALCIUM  --    < >  --    < >  --  7.7*  --  7.6* 7.7* 7.6* 7.6*  PHOS 4.4  --  4.2  --  2.9  --  2.6  --   --   --   --    < > = values in this interval not displayed.    Liver  Function Tests: Recent Labs  Lab 01/23/18 0447 01/25/18 0411  AST 86* 70*  ALT 127* 58*  ALKPHOS 194* 170*  BILITOT 0.5 0.4  PROT  --  4.5*  ALBUMIN  --  1.8*   No results for input(s): LIPASE, AMYLASE in the last 168 hours. No results for input(s): AMMONIA in the last 168 hours.  CBC: Recent Labs  Lab 01/23/18 0447 01/28/18 0639  WBC 7.3 5.2  HGB 7.8* 6.6*  HCT 22.6* 20.3*  MCV 94.0 96.8  PLT 162 193    Cardiac Enzymes: No results for input(s): CKTOTAL, CKMB, CKMBINDEX, TROPONINI in the last 168 hours.  BNP: Invalid input(s): POCBNP  CBG: Recent Labs  Lab 01/28/18 0756 01/28/18 1313 01/28/18 1634 01/28/18 2247 01/29/18 0755  GLUCAP 73 96 139* 159* 73    Microbiology: Results for orders placed or performed during the hospital encounter of 01/13/18  Blood culture (routine x 2)     Status: None   Collection Time: 01/13/18  1:39 PM  Result Value Ref Range Status   Specimen Description RIGHT ANTECUBITAL  Final   Special Requests   Final    BOTTLES DRAWN AEROBIC AND ANAEROBIC Blood Culture adequate volume   Culture   Final    NO GROWTH 5 DAYS Performed at [facility], [address], [place], [place] [address]    Report Status 01/18/2018 FINAL  Final  Blood culture (routine x 2)     Status: None   Collection Time: 01/13/18  1:39 PM  Result Value Ref Range Status   Specimen Description LEFT ANTECUBITAL  Final   Special Requests   Final    BOTTLES DRAWN AEROBIC AND ANAEROBIC Blood Culture results may not be optimal due to an excessive volume of blood received in culture bottles   Culture   Final    NO GROWTH 5 DAYS Performed at [facility], [address] [place], [place] [address]    Report Status 01/18/2018 FINAL  Final  Urine Culture     Status: Abnormal   Collection Time: 01/13/18  1:39 PM  Result Value Ref Range Status   Specimen Description   Final    URINE, RANDOM Performed at [facility], [address],  [place], [place] [address]    Special Requests   Final    NONE Performed at [facility], [address] [place], [place] [address]    Culture >=100,000 COLONIES/mL PSEUDOMONAS AERUGINOSA (A)  Final   Report Status 01/16/2018 FINAL  Final   Organism ID, Bacteria PSEUDOMONAS AERUGINOSA (A)  Final      Susceptibility   Pseudomonas aeruginosa - MIC*    CEFTAZIDIME 4 SENSITIVE Sensitive     CIPROFLOXACIN <=0.25 SENSITIVE Sensitive     GENTAMICIN <=1 SENSITIVE Sensitive     IMIPENEM 1 SENSITIVE Sensitive     PIP/TAZO <=4 SENSITIVE Sensitive     CEFEPIME 2 SENSITIVE Sensitive     * >=100,000 COLONIES/mL PSEUDOMONAS AERUGINOSA  MRSA PCR Screening     Status: None   Collection Time: 01/13/18  5:13 PM  Result Value Ref Range Status   MRSA by PCR NEGATIVE NEGATIVE Final    Comment:        The GeneXpert MRSA Assay (FDA approved for NASAL specimens only), is one component of a comprehensive MRSA colonization surveillance program. It is not intended to diagnose MRSA infection nor to guide or monitor treatment for MRSA infections. Performed at [facility], [address] [place], [place] [address]     Coagulation Studies: No results for input(s): LABPROT, INR in the last 72 hours.  Urinalysis: No results for input(s): COLORURINE, LABSPEC, PHURINE, GLUCOSEU, HGBUR, BILIRUBINUR, KETONESUR, PROTEINUR, UROBILINOGEN, NITRITE, LEUKOCYTESUR in the last 72 hours.  Invalid input(s): APPERANCEUR    Imaging: No results found.   Medications:    . sodium chloride   Intravenous Once  . allopurinol  100 mg Oral Daily  . apixaban  2.5 mg Oral BID  . atorvastatin  10 mg Oral q1800  . Chlorhexidine Gluconate Cloth  6 each Topical Q0600  . epoetin (EPOGEN/PROCRIT) injection  10,000 Units Intravenous Q T,Th,Sa-HD  .  insulin aspart  0-9 Units Subcutaneous TID WC  . levothyroxine  50 mcg Oral QAC breakfast  . midodrine  10 mg Oral TID WC  . multivitamin  1 tablet Oral QHS   . multivitamin-lutein  1 capsule Oral Daily  . OLANZapine  2.5 mg Oral QHS  . povidone-iodine   Topical BID  . senna-docusate  2 tablet Oral BID  . tuberculin  5 Units Intradermal Once  . vitamin C  500 mg Oral BID   loperamide, [DISCONTINUED] ondansetron **OR** ondansetron (ZOFRAN) IV, ondansetron (ZOFRAN) IV, oxyCODONE-acetaminophen, polyethylene glycol  Assessment/ Plan:  [name] is a 77 y.o. white male with chronic systolic congestive heart failure, diabetes mellitus type 2, atrial fibrillation, hypertension, history of gastric ulcer, coronary disease, who was admitted to [facility] on9/23/2019for evaluation of altered mental status.  1.  Acute renal failure on chronic kidney disease stage III Baseline creatinine 1.30/GFR 51 from December 20, 2017 Acute renal failure appears to be multifactorial including hypotension, sepsis, likely leading to ATN. Renal u/s shows left non obstructing stone 5 mm; no hydronephrosis Required hemodialysis treatment today No indication for dialysis at this time. Will hold off of further dialysis.   2. Latent TB: with elevated LFTs (10/5) - no indication for treatment at this time - Appreciate ID input.   3.  UTI and sepsis, Pseudomonas aeuroginosa Treated with cefepime 9/29 to 10/3  4. Anemia of chronic kidney disease.  Hemoglobin 6.6 - PRBC 1 unit transfusion with HD treatment yesterday on 10/8 - Pending hemoglobin today  5. Hypotension: blood pressure at goal - midodrine .    LOS: [place] 10/9/201910:09 AM

## 2018-01-29 NOTE — Progress Notes (Addendum)
[place] [facility] [place] NAME: Gregory Tyler    MR#:  [id number]  DATE OF BIRTH:  01/17/1941  SUBJECTIVE:  Patient with no new complaints.  Hemodialysis has been stopped by nephrology No new episodes of hemodialysis anymore Social worker [facility] with [facility] wanted acid-fast bacillus smear to be done as patient has positive QuantiFERON test but manage could not be given secondary to abnormal LFT Case was discussed with the infectious disease and AFB smear has been sent Awaiting bed placement at [facility] Renal function back to baseline  REVIEW OF SYSTEMS:   Review of Systems  Constitutional: Negative for chills, fever and weight loss.  HENT: Negative for ear discharge, ear pain and nosebleeds.   Eyes: Negative for blurred vision, pain and discharge.  Respiratory: Negative for sputum production, shortness of breath, wheezing and stridor.   Cardiovascular: Negative for chest pain, palpitations, orthopnea and PND.  Gastrointestinal: Negative for abdominal pain, diarrhea, nausea and vomiting.  Genitourinary: Negative for frequency and urgency.  Musculoskeletal: Negative for back pain and joint pain.  Neurological: Positive for weakness. Negative for sensory change, speech change and focal weakness.  Psychiatric/Behavioral: Negative for depression and hallucinations. The patient is not nervous/anxious.    Tolerating Diet:yes Tolerating PT: snf  DRUG ALLERGIES:   Allergies  Allergen Reactions  . Aspirin     GI Bleeding  . Codeine Other (See Comments)    BLISTERS Other reaction(s): Other (See Comments) BLISTERS   . Codeine Sulfate Other (See Comments)    Other reaction(s): Other (See Comments) BLISTERS BLISTERS    VITALS:  Blood pressure 135/80, pulse 76, temperature 98.5 F (36.9 C), temperature source Oral, resp. rate 20, height 5\' 8"  (1.727 m), weight 80.5 kg, SpO2  98 %.  PHYSICAL EXAMINATION:   Physical Exam  GENERAL:  77 y.o.-year-old patient lying in the bed with no acute distress.  EYES: Pupils equal, round, reactive to light and accommodation. No scleral icterus. Extraocular muscles intact. PALLOR+ HEENT: Head atraumatic, normocephalic. Oropharynx and nasopharynx clear.  NECK:  Supple, no jugular venous distention. No thyroid enlargement, no tenderness.  LUNGS: Normal breath sounds bilaterally, no wheezing, rales, rhonchi. No use of accessory muscles of respiration.  CARDIOVASCULAR: S1, S2 normal. No murmurs, rubs, or gallops.  ABDOMEN: Soft, nontender, nondistended. Bowel sounds present. No organomegaly or mass.  EXTREMITIES: No cyanosis, clubbing or edema b/l.   Bilateral lower extremity wounds chronic. NEUROLOGIC: Cranial nerves II through XII are intact. No focal Motor or sensory deficits b/l. Feeling weak PSYCHIATRIC:  patient is alert and oriented x 3.  SKIN: No obvious rash, lesion, or ulcer.   LABORATORY PANEL:  CBC Recent Labs  Lab 01/28/18 0639 01/29/18 1043  WBC 5.2  --   HGB 6.6* 8.5*  HCT 20.3* 27.3*  PLT 193  --     Chemistries  Recent Labs  Lab 01/25/18 0411  01/29/18 0502  NA 142   < > 141  K 3.4*   < > 3.9  CL 110   < > 108  CO2 27   < > 27  GLUCOSE 124*   < > 89  BUN 25*   < > 18  CREATININE 1.72*   < > 1.28*  CALCIUM 7.7*   < > 7.6*  AST 70*  --   --   ALT 58*  --   --   ALKPHOS 170*  --   --  BILITOT 0.4  --   --    < > = values in this interval not displayed.   Cardiac Enzymes No results for input(s): TROPONINI in the last 168 hours. RADIOLOGY:  No results found. 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 PPD was not done since patient had developed severe reactions/cellulitis in the remote past according to the patient. Case discussed with on-call infectious disease Dr. Steva Ready , given pts  elevated LFTs not considering INH treatment at this time.   Chest x-ray negative.  No active tuberculosis elevated transaminases is a contraindication for INH treatment HIV screening test is negative  AFB acid-fast smear sent  -Acute encephalopathy due to sepsis Resolved  -Acute septic shock due to Pseudomonas UTI  Resolved Successfully weaned off pressors Treated with 6-day course of cefepime-discontinued on January 21, 2018  -Acute transaminitis  Improved Suspected due to sepsis/acute shock liver GI did see patient while in house-no intervention recommended, continue to avoid hepatotoxic agents, hepatic panel negative - abdominal ultrasound per radiology noted for fatty liver/hepatocellular disease    -Chronic systolic CHF without exacerbation repeat echo shows normal EF Stable on current regimen  -Chronic diabetes mellitus type 2 Stable on current regiment  -Hypertension  Normotensive without antihypertensive medications   -Paroxysmal atrial fibrillation On Eliquis for anticoagulation  Disposition-[facility] is from [facility]. He is a long-term resident. Patient will be discharged to [facility] facility once he is accepted   CODE STATUS: DNR   Case discussed with Care Management/Social Worker. Management plans discussed with the patient, family and they are in agreement.    DVT Prophylaxis: eliquis  TOTAL TIME TAKING CARE OF THIS PATIENT: 31 minutes.  >50% time spent on counselling and coordination of care  POSSIBLE D/C IN 1-2DAYS, DEPENDING ON CLINICAL CONDITION.  Note: This dictation was prepared with Dragon dictation along with smaller phrase technology. Any transcriptional errors that result from this process  are unintentional.  Saundra Shelling M.D on 01/29/2018 at 4:03 PM  Between 7am to 6pm - Pager - [phone]  After 6pm go to [email or web address] - password [email or web address] [facility]  Office  [phone]  CC: Primary care physician; [name], MDPatient ID: Gregory Tyler, male   DOB: December 27, 1940, 77 y.o.   MRN: [id number]

## 2018-01-29 NOTE — Care Management Important Message (Signed)
Copy of signed IM left with patient in room.  

## 2018-01-30 ENCOUNTER — Encounter: Payer: Self-pay | Admitting: Vascular Surgery

## 2018-01-30 ENCOUNTER — Encounter
Admission: EM | Disposition: A | Discharge status: Skilled Nursing Facility | Payer: Self-pay | Source: Home / Self Care | Attending: Family Medicine

## 2018-01-30 ENCOUNTER — Ambulatory Visit: Admission: RE | Admit: 2018-01-30 | Payer: Medicare Other | Source: Ambulatory Visit | Admitting: Vascular Surgery

## 2018-01-30 DIAGNOSIS — L97919 Non-pressure chronic ulcer of unspecified part of right lower leg with unspecified severity: Secondary | ICD-10-CM

## 2018-01-30 DIAGNOSIS — L97929 Non-pressure chronic ulcer of unspecified part of left lower leg with unspecified severity: Secondary | ICD-10-CM

## 2018-01-30 DIAGNOSIS — I70249 Atherosclerosis of native arteries of left leg with ulceration of unspecified site: Secondary | ICD-10-CM

## 2018-01-30 DIAGNOSIS — I739 Peripheral vascular disease, unspecified: Secondary | ICD-10-CM

## 2018-01-30 HISTORY — PX: LOWER EXTREMITY ANGIOGRAPHY: CATH118251

## 2018-01-30 LAB — BASIC METABOLIC PANEL
ANION GAP: 5 (ref 5–15)
BUN: 22 mg/dL (ref 8–23)
CALCIUM: 7.6 mg/dL — AB (ref 8.9–10.3)
CHLORIDE: 110 mmol/L (ref 98–111)
CO2: 28 mmol/L (ref 22–32)
Creatinine, Ser: 1.45 mg/dL — ABNORMAL HIGH (ref 0.61–1.24)
GFR calc Af Amer: 52 mL/min — ABNORMAL LOW (ref >60)
GFR calc non Af Amer: 45 mL/min — ABNORMAL LOW (ref >60)
Glucose, Bld: 101 mg/dL — ABNORMAL HIGH (ref 70–99)
POTASSIUM: 3.6 mmol/L (ref 3.5–5.1)
Sodium: 143 mmol/L (ref 135–145)

## 2018-01-30 LAB — GLUCOSE, CAPILLARY
Glucose-Capillary: 82 mg/dL (ref 70–99)
Glucose-Capillary: 62 mg/dL — ABNORMAL LOW (ref 70–99)
Glucose-Capillary: 93 mg/dL (ref 70–99)
Glucose-Capillary: 163 mg/dL — ABNORMAL HIGH (ref 70–99)

## 2018-01-30 SURGERY — LOWER EXTREMITY ANGIOGRAPHY
Anesthesia: Moderate Sedation | Site: Leg Lower | Laterality: Left

## 2018-01-30 SURGERY — LOWER EXTREMITY ANGIOGRAPHY
Anesthesia: Moderate Sedation | Laterality: Left

## 2018-01-30 MED ORDER — MIDAZOLAM HCL 2 MG/2ML IJ SOLN
INTRAMUSCULAR | Status: DC | PRN
  Administered 2018-01-30: 1 mg via INTRAVENOUS
  Administered 2018-01-30: 2 mg via INTRAVENOUS

## 2018-01-30 MED ORDER — ASPIRIN EC 81 MG PO TBEC: 81.0000 mg | DELAYED_RELEASE_TABLET | Freq: Every day | ORAL | Status: DC

## 2018-01-30 MED ORDER — FENTANYL CITRATE (PF) 100 MCG/2ML IJ SOLN
INTRAMUSCULAR | Status: DC | PRN
  Administered 2018-01-30: 50 ug via INTRAVENOUS
  Administered 2018-01-30: 25 ug via INTRAVENOUS

## 2018-01-30 MED ORDER — SODIUM CHLORIDE 0.9 % IV SOLN
INTRAVENOUS | Status: DC
  Administered 2018-01-30: 10:00:00 via INTRAVENOUS

## 2018-01-30 MED ORDER — FENTANYL CITRATE (PF) 100 MCG/2ML IJ SOLN
INTRAMUSCULAR | Status: AC
  Filled 2018-01-30: qty 2

## 2018-01-30 MED ORDER — HEPARIN SODIUM (PORCINE) 1000 UNIT/ML IJ SOLN
INTRAMUSCULAR | Status: AC
  Filled 2018-01-30: qty 1

## 2018-01-30 MED ORDER — HEPARIN SODIUM (PORCINE) 1000 UNIT/ML IJ SOLN
INTRAMUSCULAR | Status: DC | PRN
  Administered 2018-01-30: 4000 [IU] via INTRAVENOUS

## 2018-01-30 MED ORDER — HEPARIN (PORCINE) IN NACL 1000-0.9 UT/500ML-% IV SOLN
INTRAVENOUS | Status: AC
  Filled 2018-01-30: qty 1000

## 2018-01-30 MED ORDER — LIDOCAINE-EPINEPHRINE (PF) 1 %-1:200000 IJ SOLN
INTRAMUSCULAR | Status: AC
  Filled 2018-01-30: qty 30

## 2018-01-30 MED ORDER — MIDAZOLAM HCL 5 MG/5ML IJ SOLN
INTRAMUSCULAR | Status: AC
  Filled 2018-01-30: qty 5

## 2018-01-30 MED ORDER — SODIUM CHLORIDE FLUSH 0.9 % IV SOLN
INTRAVENOUS | Status: AC
  Filled 2018-01-30: qty 20

## 2018-01-30 MED ORDER — CEFAZOLIN SODIUM-DEXTROSE 2-4 GM/100ML-% IV SOLN
2.0000 g | Freq: Once | INTRAVENOUS | Status: AC
  Administered 2018-01-30: 2 g via INTRAVENOUS

## 2018-01-30 SURGICAL SUPPLY — 19 items
BALLN LUTONIX DCB 6X80X130 (BALLOONS) ×3
BALLN ULTRVRSE 2.5X300X150 (BALLOONS) ×3
BALLOON LUTONIX DCB 6X80X130 (BALLOONS) ×1 IMPLANT
BALLOON ULTRVRSE 2.5X300X150 (BALLOONS) ×1 IMPLANT
CATH BEACON 5 .035 65 RIM TIP (CATHETERS) ×3 IMPLANT
CATH CXI SUPP 2.6F 150 ANG (CATHETERS) ×3 IMPLANT
CATH CXI SUPP ANG 4FR 135 (CATHETERS) ×1 IMPLANT
CATH CXI SUPP ANG 4FR 135CM (CATHETERS) ×3
CATH VS15FR (CATHETERS) ×3 IMPLANT
DEVICE PRESTO INFLATION (MISCELLANEOUS) ×3 IMPLANT
DEVICE STARCLOSE SE CLOSURE (Vascular Products) ×3 IMPLANT
GLIDEWIRE ADV .035X260CM (WIRE) ×3 IMPLANT
GUIDEWIRE PFTE-COATED .018X300 (WIRE) ×3 IMPLANT
PACK ANGIOGRAPHY (CUSTOM PROCEDURE TRAY) ×3 IMPLANT
SHEATH BRITE TIP 5FRX11 (SHEATH) ×3 IMPLANT
SHEATH RAABE 6FRX70 (SHEATH) ×3 IMPLANT
TUBING CONTRAST HIGH PRESS 72 (TUBING) ×3 IMPLANT
WIRE G V18X300CM (WIRE) ×3 IMPLANT
WIRE J 3MM .035X145CM (WIRE) ×3 IMPLANT

## 2018-01-30 NOTE — Progress Notes (Signed)
Report called to [place] at [facility].  EMS called for transport

## 2018-01-30 NOTE — Op Note (Signed)
[facility]  Percutaneous Study/Intervention Procedural Note   Date of Surgery: 01/30/2018  Surgeon(s):DEW,JASON    Assistants:none  Pre-operative Diagnosis: PAD with ulceration bilateral lower extremities  Post-operative diagnosis:  Same  Procedure(s) Performed:             1.  Ultrasound guidance for vascular access right femoral artery             2.  Catheter placement into left anterior tibial artery and left posterior tibial artery from right femoral approach             3.  Selective left lower extremity angiogram             4.  Percutaneous transluminal angioplasty of the left anterior tibial artery with 2.5 mm diameter by 30 cm length angioplasty balloon             5.   Percutaneous transluminal angioplasty of the left above-knee popliteal artery with 6 mm diameter by 8 cm length Lutonix drug-coated angioplasty balloon  6.  StarClose closure device right femoral artery  EBL: 5 cc  Contrast: 50 cc  Fluoro Time: 13.2 minutes  Moderate Conscious Sedation Time: approximately 40 minutes using 3 mg of Versed and 75 mcg of Fentanyl              Indications:  Patient is a 77 y.o.male with non-healing ulcerations to both feet. The patient is brought in for angiography for further evaluation and potential treatment.  Due to the limb threatening nature of the situation, angiogram was performed for attempted limb salvage. The patient is aware that if the procedure fails, amputation would be expected.  The patient also understands that even with successful revascularization, amputation may still be required due to the severity of the situation.  Risks and benefits are discussed and informed consent is obtained.   Procedure:  The patient was identified and appropriate procedural time out was performed.  The patient was then placed supine on the table and prepped and draped in the usual sterile fashion. Moderate conscious sedation was administered during a face to  face encounter with the patient throughout the procedure with my supervision of the RN administering medicines and monitoring the patient's vital signs, pulse oximetry, telemetry and mental status throughout from the start of the procedure until the patient was taken to the recovery room. Ultrasound was used to evaluate the right common femoral artery.  It was patent .  A digital ultrasound image was acquired.  A Seldinger needle was used to access the right common femoral artery under direct ultrasound guidance and a permanent image was performed.  A 0.035 J wire was advanced without resistance and a 5Fr sheath was placed.   And aortogram was not required due to one being recently done and no intervention being necessary in the aorta or iliac segments.  I then crossed the aortic bifurcation with a VS 1 catheter and advanced to the proximal left superficial femoral artery. Selective left lower extremity angiogram was then performed. This demonstrated Normal common femoral artery, profunda femoris artery, and mild disease of the superficial femoral artery.  The popliteal artery had about a 55 to 60% stenosis just above the knee.  There was then a peroneal artery which was continuous into the foot with only about a 30% stenosis in the tibioperoneal trunk.  The posterior tibial artery and anterior tibial arteries had a long segment occlusions but did reconstitute just above the ankle. It was  felt that it was in the patient's best interest to proceed with intervention after these images to avoid a second procedure and a larger amount of contrast and fluoroscopy based off of the findings from the initial angiogram. The patient was systemically heparinized and a 6 [place] Ansell sheath was then placed over the [employer] wire. I then used a Kumpe catheter and the advantage wire to through the popliteal stenosis and down into the proximal anterior tibial artery.  I then exchanged for a CXI catheter and a 0.018 wire  and was able to tediously across the entire anterior tibial artery and confirm intraluminal flow in the foot.  I then remove the diagnostic catheter and proceeded with intervention.  A 2.5 mm diameter by 30 cm length angioplasty balloon was then inflated twice to treat from the foot all the way up to the origin of the anterior tibial artery. Completion angiogram showed in line flow to the foot with some spasm in the foot but no greater than 30% stenosis identified.  I then treated the popliteal artery with a 6 mm x 8 cm Lutonix drug coated angioplasty balloon inflated to 10 atm for one minute.  Completion imaging showed about a 30% residual stenosis. I elected to terminate the procedure. The sheath was removed and StarClose closure device was deployed in the right femoral artery with excellent hemostatic result. The patient was taken to the recovery room in stable condition having tolerated the procedure well.  Findings:                            Left lower Extremity:  Normal common femoral artery, profunda femoris artery, and mild disease of the superficial femoral artery.  The popliteal artery had about a 55 to 60% stenosis just above the knee.  There was then a peroneal artery which was continuous into the foot with only about a 30% stenosis in the tibioperoneal trunk.  The posterior tibial artery and anterior tibial arteries had a long segment occlusions but did reconstitute just above the ankle.   Disposition: Patient was taken to the recovery room in stable condition having tolerated the procedure well.  Complications: None  Leotis Pain 01/30/2018 11:08 AM   This note was created with Dragon Medical transcription system. Any errors in dictation are purely unintentional.

## 2018-01-30 NOTE — Progress Notes (Signed)
[facility]  ROUNDING NOTE   Subjective:   Angiogram this morning.   Creatinine 1.45    Objective:  Vital signs in last 24 hours:  Temp:  [97.9 F (36.6 C)-98.6 F (37 C)] 97.9 F (36.6 C) (10/10 1206) Pulse Rate:  [25-76] 58 (10/10 1206) Resp:  [11-18] 14 (10/10 1206) BP: (108-146)/(53-77) 136/62 (10/10 1206) SpO2:  [95 %-100 %] 100 % (10/10 1206)  Weight change:  Filed Weights   01/25/18 1634 01/28/18 0855 01/28/18 1237  Weight: 81 kg 81 kg 80.5 kg    Intake/Output: I/O last 3 completed shifts: In: 120 [P.O.:120] Out: -    Intake/Output this shift:  No intake/output data recorded.  Physical Exam: General: NAD,   Head: Normocephalic, atraumatic. Moist oral mucosal membranes  Eyes: Anicteric, PERRL  Neck: Supple, trachea midline  Lungs:  Clear to auscultation  Heart: Regular rate and rhythm  Abdomen:  Soft, nontender,   Extremities: No peripheral edema.  Neurologic: Nonfocal, moving all four extremities  Skin: No lesions  Access: RIJ permcath    Basic Metabolic Panel: Recent Labs  Lab 01/23/18 1906  01/24/18 2032  01/25/18 1320 01/26/18 0741 01/27/18 0501 01/28/18 0639 01/29/18 0502 01/30/18 0648  NA  --    < >  --    < >  --  140 140 140 141 143  K  --    < >  --    < >  --  3.2* 4.0 3.1* 3.9 3.6  CL  --    < >  --    < >  --  103 104 105 108 110  CO2  --    < >  --    < >  --  29 28 27 27 28   GLUCOSE  --    < >  --    < >  --  91 95 86 89 101*  BUN  --    < >  --    < >  --  20 26* 33* 18 22  CREATININE  --    < >  --    < >  --  1.60* 2.16* 2.07* 1.28* 1.45*  CALCIUM  --    < >  --    < >  --  7.6* 7.7* 7.6* 7.6* 7.6*  PHOS 4.2  --  2.9  --  2.6  --   --   --   --   --    < > = values in this interval not displayed.    Liver Function Tests: Recent Labs  Lab 01/25/18 0411  AST 70*  ALT 58*  ALKPHOS 170*  BILITOT 0.4  PROT 4.5*  ALBUMIN 1.8*   No results for input(s): LIPASE, AMYLASE in the last 168 hours. No results  for input(s): AMMONIA in the last 168 hours.  CBC: Recent Labs  Lab 01/28/18 0639 01/29/18 1043  WBC 5.2  --   HGB 6.6* 8.5*  HCT 20.3* 27.3*  MCV 96.8  --   PLT 193  --     Cardiac Enzymes: No results for input(s): CKTOTAL, CKMB, CKMBINDEX, TROPONINI in the last 168 hours.  BNP: Invalid input(s): POCBNP  CBG: Recent Labs  Lab 01/29/18 1652 01/29/18 2131 01/30/18 0801 01/30/18 1122 01/30/18 1219  GLUCAP 115* 185* 82 62* 93    Microbiology: Results for orders placed or performed during the hospital encounter of 01/13/18  Blood culture (routine x 2)     Status:  None   Collection Time: 01/13/18  1:39 PM  Result Value Ref Range Status   Specimen Description RIGHT ANTECUBITAL  Final   Special Requests   Final    BOTTLES DRAWN AEROBIC AND ANAEROBIC Blood Culture adequate volume   Culture   Final    NO GROWTH 5 DAYS Performed at [facility], [address] [place], [place] [address]    Report Status 01/18/2018 FINAL  Final  Blood culture (routine x 2)     Status: None   Collection Time: 01/13/18  1:39 PM  Result Value Ref Range Status   Specimen Description LEFT ANTECUBITAL  Final   Special Requests   Final    BOTTLES DRAWN AEROBIC AND ANAEROBIC Blood Culture results may not be optimal due to an excessive volume of blood received in culture bottles   Culture   Final    NO GROWTH 5 DAYS Performed at [facility], [address] [place], [place] [address]    Report Status 01/18/2018 FINAL  Final  Urine Culture     Status: Abnormal   Collection Time: 01/13/18  1:39 PM  Result Value Ref Range Status   Specimen Description   Final    URINE, RANDOM Performed at [facility], [address] [place], [place] [address]    Special Requests   Final    NONE Performed at [facility], [address], [place], [place] [address]    Culture >=100,000 COLONIES/mL PSEUDOMONAS AERUGINOSA (A)  Final   Report Status 01/16/2018 FINAL   Final   Organism ID, Bacteria PSEUDOMONAS AERUGINOSA (A)  Final      Susceptibility   Pseudomonas aeruginosa - MIC*    CEFTAZIDIME 4 SENSITIVE Sensitive     CIPROFLOXACIN <=0.25 SENSITIVE Sensitive     GENTAMICIN <=1 SENSITIVE Sensitive     IMIPENEM 1 SENSITIVE Sensitive     PIP/TAZO <=4 SENSITIVE Sensitive     CEFEPIME 2 SENSITIVE Sensitive     * >=100,000 COLONIES/mL PSEUDOMONAS AERUGINOSA  MRSA PCR Screening     Status: None   Collection Time: 01/13/18  5:13 PM  Result Value Ref Range Status   MRSA by PCR NEGATIVE NEGATIVE Final    Comment:        The GeneXpert MRSA Assay (FDA approved for NASAL specimens only), is one component of a comprehensive MRSA colonization surveillance program. It is not intended to diagnose MRSA infection nor to guide or monitor treatment for MRSA infections. Performed at [facility], [address] [place], [place] [address]     Coagulation Studies: No results for input(s): LABPROT, INR in the last 72 hours.  Urinalysis: No results for input(s): COLORURINE, LABSPEC, PHURINE, GLUCOSEU, HGBUR, BILIRUBINUR, KETONESUR, PROTEINUR, UROBILINOGEN, NITRITE, LEUKOCYTESUR in the last 72 hours.  Invalid input(s): APPERANCEUR    Imaging: No results found.   Medications:    . allopurinol  100 mg Oral Daily  . apixaban  2.5 mg Oral BID  . aspirin EC  81 mg Oral Daily  . atorvastatin  10 mg Oral q1800  . Chlorhexidine Gluconate Cloth  6 each Topical Q0600  . epoetin (EPOGEN/PROCRIT) injection  10,000 Units Intravenous Q T,Th,Sa-HD  . insulin aspart  0-9 Units Subcutaneous TID WC  . levothyroxine  50 mcg Oral QAC breakfast  . midodrine  10 mg Oral TID WC  . multivitamin  1 tablet Oral QHS  . multivitamin-lutein  1 capsule Oral Daily  . OLANZapine  2.5 mg Oral QHS  .  povidone-iodine   Topical BID  . senna-docusate  2 tablet Oral BID  . tuberculin  5 Units Intradermal Once  . vitamin C  500 mg Oral BID   loperamide, [DISCONTINUED]  ondansetron **OR** ondansetron (ZOFRAN) IV, ondansetron (ZOFRAN) IV, oxyCODONE-acetaminophen, polyethylene glycol  Assessment/ Plan:  [name] is a 77 y.o. white male with chronic systolic congestive heart failure, diabetes mellitus type 2, atrial fibrillation, hypertension, history of gastric ulcer, coronary disease, who was admitted to [facility] on9/23/2019for evaluation of altered mental status.  1.  Acute renal failure on chronic kidney disease stage III Baseline creatinine 1.30/GFR 51 from December 20, 2017 Acute renal failure appears to be multifactorial including hypotension, sepsis, likely leading to ATN. Renal u/s shows left non obstructing stone 5 mm; no hydronephrosis No indication for dialysis at this time. Will hold off of further dialysis.   2. Latent TB: with elevated LFTs (10/5) - no indication for treatment at this time - Appreciate ID input.   3.  UTI and sepsis, Pseudomonas aeuroginosa Treated with cefepime 9/29 to 10/3  4. Anemia of chronic kidney disease.  Hemoglobin 6.6 - PRBC 1 unit transfusion with HD treatment yesterday on 10/8  5. Hypotension: blood pressure at goal - midodrine .    LOS: [place] 10/10/20191:10 PM

## 2018-01-30 NOTE — Progress Notes (Signed)
Patient transported to specials

## 2018-01-30 NOTE — Progress Notes (Addendum)
Pt was talked to by floor nurse and stressed the importance of the AFB sputum test. Pt was confused during my earlier speaking with him. He was under the impression he had been cleared and was going back to his facility in the morning. Cpt x10 mins completed, small sample received and passed on to the pt nurse.

## 2018-01-30 NOTE — H&P (Signed)
[facility] History & Physical Update  The patient was interviewed and re-examined.  The patient's previous History and Physical has been reviewed and is unchanged.  There is no change in the plan of care. We plan to proceed with the scheduled procedure.  Leotis Pain, MD  01/30/2018, 9:34 AM

## 2018-01-30 NOTE — Discharge Summary (Signed)
[facility] [place] NAME: Gregory Tyler    MR#:  [id number]  DATE OF BIRTH:  November 07, 1940  DATE OF ADMISSION:  01/13/2018 ADMITTING PHYSICIAN: Dustin Flock, MD  DATE OF DISCHARGE: 01/30/2018  PRIMARY CARE PHYSICIAN: [name], MD   ADMISSION DIAGNOSIS:  Sepsis, due to unspecified organism [A41.9] Urinary tract infection without hematuria, site unspecified [N39.0] Acute renal failure, unspecified acute renal failure type ([place]) [N17.9] Altered mental status, unspecified altered mental status type [R41.82] Sepsis ([place]) [A41.9]  DISCHARGE DIAGNOSIS:  CKD stage III Acute metabolic encephalopathy Septic shock secondary to Pseudomonas UTI Pseudomonas urinary tract infection Shock liver sepsis SECONDARY DIAGNOSIS:   Past Medical History:  Diagnosis Date  . Anemia   . Anxiety   . Arthritis   . Cardiomyopathy ([place])    a. 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  [place] Chronic combined systolic (congestive) and diastolic (congestive) heart failure ([place])    a. 05/2015 TEE: EF 35-40% (in setting of Afib).  . Chronic Pelvic pain   . Coronary artery disease    a. 05/2015 CTA Chest: 3 vessel coronary Ca2+.  . Depression   . Diabetes mellitus without complication ([place])   . Emphysema lung ([place])   . Gastric ulcer   . Gout   . Hyperlipidemia   . Hypertension   . Nephrolithiasis   . PAF (paroxysmal atrial fibrillation) ([place])    a. 05/2015 s/p DCCV; b. 03/2016 s/p DCCV; c. CHA2DS2VASc = 6 (coumadin).  . Prostate cancer [facility])    Prostate  . Stage III chronic kidney disease ([place])   . Weight loss    50 lb since Apr 24, 2015     ADMITTING HISTORY Gregory Tyler  is a 77 y.o. male with a known history of chronic systolic CHF, diabetes type 2, paroxysmal atrial fibrillation, coronary artery disease, gastric ulcer, hypertension, hyperlipidemia who currently resides in a skilled nursing facility brought from there for altered  mental status.  Patient son states that his father was in usual state of health yesterday however today he was confused and lethargic.  Patient noted to be hypotensive, noted to have acute renal failure, noted to have elevated liver function test.  Also noticed to have UTI.  HOSPITAL COURSE:  Patient was admitted to medical floor started on broad-spectrum antibiotics for sepsis secondary to urinary tract infection.  Patient had a prolonged stay in the hospital.  Patient had abnormal LFTs secondary to shock liver.  Patient was evaluated by infectious disease, gastroenterology and nephrology during the stay in the hospital.  Patient was worked up with CT head renal renal ultrasound during the hospitalization.  Urine culture grew Pseudomonas and was treated with IV cefepime antibiotic and completed a course of antibiotics.  Patient also received PRBC transfusion for anemia.  He was dialyzed for chronic kidney disease and followed by nephrology closely.  His creatinine improved and was 1.28 at the time of discharge.  Nephrology did not recommend continuous dialysis and dialysis has been stopped.  He had a QuantiFERON test done which was positive but bandage could not be given secondary to abnormal LFTs.  INH therapy was deferred and patient was seen by infectious disease attending during the stay in the hospital.  Patient is metabolic encephalopathy completely resolved.  His renal function improved.  His hemoglobin has been stable after PRBC transfusion.  MRSA PCR was negative.  Culture blood did not reveal any growth.  Patient tolerated  IV antibiotics well. Angiogram done on 101/10 2019 by vascular surgery. Will be discharged to [facility] facility  Today.  CONSULTS OBTAINED:  Treatment Team:  Murlean Iba, MD Tsosie Billing, MD Gastroenterology consultation DRUG ALLERGIES:   Allergies  Allergen Reactions  . Aspirin     GI Bleeding  . Codeine Other (See Comments)    BLISTERS Other  reaction(s): Other (See Comments) BLISTERS   . Codeine Sulfate Other (See Comments)    Other reaction(s): Other (See Comments) BLISTERS BLISTERS    DISCHARGE MEDICATIONS:   Allergies as of 01/30/2018      Reactions   Aspirin    GI Bleeding   Codeine Other (See Comments)   BLISTERS Other reaction(s): Other (See Comments) BLISTERS   Codeine Sulfate Other (See Comments)   Other reaction(s): Other (See Comments) BLISTERS BLISTERS      Medication List    STOP taking these medications   furosemide 20 MG tablet Commonly known as:  LASIX   lisinopril 5 MG tablet Commonly known as:  PRINIVIL,ZESTRIL   metFORMIN 500 MG tablet Commonly known as:  GLUCOPHAGE   metoprolol tartrate 50 MG tablet Commonly known as:  LOPRESSOR     TAKE these medications   acetaminophen 325 MG tablet Commonly known as:  TYLENOL Take 2 tablets (650 mg total) by mouth every 6 (six) hours as needed for mild pain (or Fever >/= 101).   allopurinol 300 MG tablet Commonly known as:  ZYLOPRIM Take 300 mg by mouth daily.   ELIQUIS 2.5 MG Tabs tablet Generic drug:  apixaban Take 2.5 mg by mouth every 12 (twelve) hours.   feeding supplement (ENSURE ENLIVE) Liqd Take 237 mLs by mouth 2 (two) times daily between meals.   gabapentin 100 MG capsule Commonly known as:  NEURONTIN Take 1 capsule (100 mg total) by mouth at bedtime.   HYDROcodone-acetaminophen 5-325 MG tablet Commonly known as:  NORCO/VICODIN Take 1 tablet by mouth every 6 (six) hours as needed for moderate pain or severe pain. What changed:    when to take this  reasons to take this   magnesium oxide 400 (241.3 Mg) MG tablet Commonly known as:  MAG-OX Take 400 mg by mouth daily.   magnesium oxide 400 MG tablet Commonly known as:  MAG-OX Take 400 mg by mouth daily.   Medical Compression Thigh High Misc 1 application by Does not apply route daily.   rosuvastatin 20 MG tablet Commonly known as:  CRESTOR Take 20 mg by mouth  daily.       Today  Patient seen and evaluated on the day of discharge No complaints of any chest pain, shortness of breath No fever and chills Tolerating diet well  VITAL SIGNS:  Blood pressure (!) 146/77, pulse (!) 25, temperature 98.5 F (36.9 C), resp. rate 18, height 5\' 8"  (1.727 m), weight 80.5 kg, SpO2 99 %.  I/O:    Intake/Output Summary (Last 24 hours) at 01/30/2018 1037 Last data filed at 01/30/2018 0900 Gross per 24 hour  Intake 60 ml  Output -  Net 60 ml    PHYSICAL EXAMINATION:  Physical Exam  GENERAL:  77 y.o.-year-old patient lying in the bed with no acute distress.  LUNGS: Normal breath sounds bilaterally, no wheezing, rales,rhonchi or crepitation. No use of accessory muscles of respiration.  CARDIOVASCULAR: S1, S2 normal. No murmurs, rubs, or gallops.  ABDOMEN: Soft, non-tender, non-distended. Bowel sounds present. No organomegaly or mass.  NEUROLOGIC: Moves all 4 extremities. PSYCHIATRIC: The patient is alert  and oriented x 3.  SKIN: No obvious rash, lesion, or ulcer.   DATA REVIEW:   CBC Recent Labs  Lab 01/28/18 0639 01/29/18 1043  WBC 5.2  --   HGB 6.6* 8.5*  HCT 20.3* 27.3*  PLT 193  --     Chemistries  Recent Labs  Lab 01/25/18 0411  01/30/18 0648  NA 142   < > 143  K 3.4*   < > 3.6  CL 110   < > 110  CO2 27   < > 28  GLUCOSE 124*   < > 101*  BUN 25*   < > 22  CREATININE 1.72*   < > 1.45*  CALCIUM 7.7*   < > 7.6*  AST 70*  --   --   ALT 58*  --   --   ALKPHOS 170*  --   --   BILITOT 0.4  --   --    < > = values in this interval not displayed.    Cardiac Enzymes No results for input(s): TROPONINI in the last 168 hours.  Microbiology Results  Results for orders placed or performed during the hospital encounter of 01/13/18  Blood culture (routine x 2)     Status: None   Collection Time: 01/13/18  1:39 PM  Result Value Ref Range Status   Specimen Description RIGHT ANTECUBITAL  Final   Special Requests   Final     BOTTLES DRAWN AEROBIC AND ANAEROBIC Blood Culture adequate volume   Culture   Final    NO GROWTH 5 DAYS Performed at [facility], [address] [place], [place] [address]    Report Status 01/18/2018 FINAL  Final  Blood culture (routine x 2)     Status: None   Collection Time: 01/13/18  1:39 PM  Result Value Ref Range Status   Specimen Description LEFT ANTECUBITAL  Final   Special Requests   Final    BOTTLES DRAWN AEROBIC AND ANAEROBIC Blood Culture results may not be optimal due to an excessive volume of blood received in culture bottles   Culture   Final    NO GROWTH 5 DAYS Performed at [facility], [address], [place], [place] [address]    Report Status 01/18/2018 FINAL  Final  Urine Culture     Status: Abnormal   Collection Time: 01/13/18  1:39 PM  Result Value Ref Range Status   Specimen Description   Final    URINE, RANDOM Performed at [facility], [address], [place], [place] [address]    Special Requests   Final    NONE Performed at [facility], [address] [place], [place] [address]    Culture >=100,000 COLONIES/mL PSEUDOMONAS AERUGINOSA (A)  Final   Report Status 01/16/2018 FINAL  Final   Organism ID, Bacteria PSEUDOMONAS AERUGINOSA (A)  Final      Susceptibility   Pseudomonas aeruginosa - MIC*    CEFTAZIDIME 4 SENSITIVE Sensitive     CIPROFLOXACIN <=0.25 SENSITIVE Sensitive     GENTAMICIN <=1 SENSITIVE Sensitive     IMIPENEM 1 SENSITIVE Sensitive     PIP/TAZO <=4 SENSITIVE Sensitive     CEFEPIME 2 SENSITIVE Sensitive     * >=100,000 COLONIES/mL PSEUDOMONAS AERUGINOSA  MRSA PCR Screening     Status: None   Collection Time: 01/13/18  5:13 PM  Result Value Ref Range Status   MRSA by PCR NEGATIVE NEGATIVE Final    Comment:  The GeneXpert MRSA Assay (FDA approved for NASAL specimens only), is one component of a comprehensive MRSA colonization surveillance program. It is not intended to diagnose  MRSA infection nor to guide or monitor treatment for MRSA infections. Performed at [facility], [address], [place], [place] [address]     RADIOLOGY:  No results found.  Follow up with PCP in 1 week.  Management plans discussed with the patient, family and they are in agreement.  CODE STATUS: DNR    Code Status Orders  (From admission, onward)         Start     Ordered   01/20/18 1243  Do not attempt resuscitation (DNR)  Continuous    Question Answer Comment  In the event of cardiac or respiratory ARREST Do not call a "code blue"   In the event of cardiac or respiratory ARREST Do not perform Intubation, CPR, defibrillation or ACLS   In the event of cardiac or respiratory ARREST Use medication by any route, position, wound care, and other measures to relive pain and suffering. May use oxygen, suction and manual treatment of airway obstruction as needed for comfort.      01/20/18 1242        Code Status History    Date Active Date Inactive Code Status Order ID Comments User Context   01/19/2018 1329 01/20/2018 1242 Full Code [id number]  Gorden Harms, MD Inpatient   01/13/2018 1712 01/19/2018 1329 DNR [id number]  Dustin Flock, MD Inpatient   12/19/2017 1932 12/20/2017 2151 DNR [id number]  [name], MD Inpatient   11/17/2017 1357 11/19/2017 1603 DNR [id number]  Hermelinda Dellen, DO Inpatient   11/15/2017 1550 11/17/2017 1357 Full Code [id number]  Loletha Grayer, MD ED   09/11/2017 1951 09/15/2017 1603 Full Code [id number]  Bettey Costa, MD ED   05/11/2015 1307 05/13/2015 1256 Full Code [id number]  Aldean Jewett, MD Inpatient   04/25/2015 0149 04/27/2015 1741 Full Code [id number]  Hillary Bow, MD ED      TOTAL TIME TAKING CARE OF THIS PATIENT ON DAY OF DISCHARGE: more than 34 minutes.   Saundra Shelling M.D on 01/30/2018 at 10:37 AM  Between 7am to 6pm - Pager - [phone]  After 6pm go to [email or web address] - password [email or web address] [facility]   Office  [phone]  CC: Primary care physician; [name], MD  Note: This dictation was prepared with Dragon dictation along with smaller phrase technology. Any transcriptional errors that result from this process are unintentional.

## 2018-01-30 NOTE — Progress Notes (Signed)
Patient refused to take aspirin.  He said it causes intestinal bleeding.  Dr Lucky Cowboy gave order to discontinue aspirin

## 2018-01-30 NOTE — OR Nursing (Signed)
fsbs 62 pt given grape juice

## 2018-02-01 LAB — ACID FAST SMEAR (AFB, MYCOBACTERIA)

## 2018-02-01 LAB — ACID FAST SMEAR (AFB): ACID FAST SMEAR - AFSCU2: NEGATIVE

## 2018-02-03 LAB — ACID FAST SMEAR (AFB, MYCOBACTERIA): Acid Fast Smear: NEGATIVE

## 2018-02-03 LAB — ACID FAST SMEAR (AFB): ACID FAST SMEAR - AFSCU2: NEGATIVE

## 2018-02-18 ENCOUNTER — Encounter: Payer: Medicare Other | Attending: Physician Assistant | Admitting: Physician Assistant

## 2018-02-18 DIAGNOSIS — L89153 Pressure ulcer of sacral region, stage 3: Secondary | ICD-10-CM | POA: Diagnosis not present

## 2018-02-18 DIAGNOSIS — D631 Anemia in chronic kidney disease: Secondary | ICD-10-CM | POA: Insufficient documentation

## 2018-02-18 DIAGNOSIS — E1122 Type 2 diabetes mellitus with diabetic chronic kidney disease: Secondary | ICD-10-CM | POA: Diagnosis not present

## 2018-02-18 DIAGNOSIS — R627 Adult failure to thrive: Secondary | ICD-10-CM | POA: Diagnosis not present

## 2018-02-18 DIAGNOSIS — E11621 Type 2 diabetes mellitus with foot ulcer: Secondary | ICD-10-CM | POA: Diagnosis not present

## 2018-02-18 DIAGNOSIS — J449 Chronic obstructive pulmonary disease, unspecified: Secondary | ICD-10-CM | POA: Insufficient documentation

## 2018-02-18 DIAGNOSIS — I5042 Chronic combined systolic (congestive) and diastolic (congestive) heart failure: Secondary | ICD-10-CM | POA: Diagnosis not present

## 2018-02-18 DIAGNOSIS — Z87891 Personal history of nicotine dependence: Secondary | ICD-10-CM | POA: Diagnosis not present

## 2018-02-18 DIAGNOSIS — L89610 Pressure ulcer of right heel, unstageable: Secondary | ICD-10-CM | POA: Insufficient documentation

## 2018-02-18 DIAGNOSIS — Z885 Allergy status to narcotic agent status: Secondary | ICD-10-CM | POA: Diagnosis not present

## 2018-02-18 DIAGNOSIS — L97512 Non-pressure chronic ulcer of other part of right foot with fat layer exposed: Secondary | ICD-10-CM | POA: Diagnosis not present

## 2018-02-18 DIAGNOSIS — Z992 Dependence on renal dialysis: Secondary | ICD-10-CM | POA: Insufficient documentation

## 2018-02-18 DIAGNOSIS — Z8249 Family history of ischemic heart disease and other diseases of the circulatory system: Secondary | ICD-10-CM | POA: Diagnosis not present

## 2018-02-18 DIAGNOSIS — N186 End stage renal disease: Secondary | ICD-10-CM | POA: Insufficient documentation

## 2018-02-18 DIAGNOSIS — Z886 Allergy status to analgesic agent status: Secondary | ICD-10-CM | POA: Insufficient documentation

## 2018-02-18 DIAGNOSIS — L89323 Pressure ulcer of left buttock, stage 3: Secondary | ICD-10-CM | POA: Insufficient documentation

## 2018-02-18 DIAGNOSIS — L89622 Pressure ulcer of left heel, stage 2: Secondary | ICD-10-CM | POA: Diagnosis not present

## 2018-02-18 DIAGNOSIS — I132 Hypertensive heart and chronic kidney disease with heart failure and with stage 5 chronic kidney disease, or end stage renal disease: Secondary | ICD-10-CM | POA: Insufficient documentation

## 2018-02-22 NOTE — Progress Notes (Signed)
GALILEO, COLELLO ([id number]) Visit Report for 02/18/2018 Allergy List Details Patient Name: Gregory Tyler, Gregory Tyler. Date of Service: 02/18/2018 1:00 PM Medical Record Number: [id number] Patient Account Number: [id number] Date of Birth/Sex: [birth year] (77 y.o. Male) Treating RN: Cornell Barman Primary Care Landen Knoedler: Fulton Reek Other Clinician: Referring Daelan Gatt: Frazier Richards Treating Attie Nawabi/Extender: Melburn Hake, HOYT Weeks in Treatment: 0 Allergies Active Allergies codeine aspirin Allergy Notes Electronic Signature(s) Signed: 02/20/2018 7:26:08 AM By: Gretta Cool, BSN, RN, CWS, Kim RN, BSN Entered By: Gretta Cool, BSN, RN, CWS, Kim on 02/18/2018 13:35:56 Gregory Tyler ([id number]) -------------------------------------------------------------------------------- Arrival Information Details Patient Name: Gregory Tyler, Gregory Tyler. Date of Service: 02/18/2018 1:00 PM Medical Record Number: [id number] Patient Account Number: [id number] Date of Birth/Sex: March 27, 1941 (77 y.o. Male) Treating RN: Cornell Barman Primary Care Merit Maybee: Fulton Reek Other Clinician: Referring Jermany Rimel: Frazier Richards Treating Jaylene Schrom/Extender: Melburn Hake, HOYT Weeks in Treatment: 0 Visit Information Patient Arrived: Wheel Chair Arrival Time: 13:14 Accompanied By: self Transfer Assistance: Harrel Lemon Lift Patient Identification Verified: Yes Secondary Verification Process Yes Completed: Patient Has Alerts: Yes Patient Alerts: Patient on Blood Thinner Eliquis Diabetic Type II Electronic Signature(s) Signed: 02/20/2018 7:26:08 AM By: Gretta Cool, BSN, RN, CWS, Kim RN, BSN Entered By: Gretta Cool, BSN, RN, CWS, Kim on 02/18/2018 13:17:12 Gregory Tyler ([id number]) -------------------------------------------------------------------------------- Clinic Level of Care Assessment Details Patient Name: Gregory Tyler, Gregory Tyler. Date of Service: 02/18/2018 1:00 PM Medical Record Number: [id number] Patient Account Number:  [id number] Date of Birth/Sex: 11/04/1940 (77 y.o. Male) Treating RN: Montey Hora Primary Care Liesa Tsan: Fulton Reek Other Clinician: Referring Kaien Pezzullo: Frazier Richards Treating Ellizabeth Dacruz/Extender: Melburn Hake, HOYT Weeks in Treatment: 0 Clinic Level of Care Assessment Items TOOL 1 Quantity Score []  - Use when EandM and Procedure is performed on INITIAL visit 0 ASSESSMENTS - Nursing Assessment / Reassessment X - General Physical Exam (combine w/ comprehensive assessment (listed just below) when 1 20 performed on new pt. evals) X- 1 25 Comprehensive Assessment (HX, ROS, Risk Assessments, Wounds Hx, etc.) ASSESSMENTS - Wound and Skin Assessment / Reassessment []  - Dermatologic / Skin Assessment (not related to wound area) 0 ASSESSMENTS - Ostomy and/or Continence Assessment and Care []  - Incontinence Assessment and Management 0 []  - 0 Ostomy Care Assessment and Management (repouching, etc.) PROCESS - Coordination of Care X - Simple Patient / Family Education for ongoing care 1 15 []  - 0 Complex (extensive) Patient / Family Education for ongoing care X- 1 10 Staff obtains Programmer, systems, Records, Test Results / Process Orders []  - 0 Staff telephones HHA, Nursing Homes / Clarify orders / etc []  - 0 Routine Transfer to another Facility (non-emergent condition) []  - 0 Routine Hospital Admission (non-emergent condition) X- 1 15 New Admissions / Biomedical engineer / Ordering NPWT, Apligraf, etc. []  - 0 Emergency Hospital Admission (emergent condition) PROCESS - Special Needs []  - Pediatric / Minor Patient Management 0 []  - 0 Isolation Patient Management []  - 0 Hearing / Language / Visual special needs []  - 0 Assessment of Community assistance (transportation, D/C planning, etc.) []  - 0 Additional assistance / Altered mentation []  - 0 Support Surface(s) Assessment (bed, cushion, seat, etc.) [name], [name] ([id number]) INTERVENTIONS - Miscellaneous []  - External ear  exam 0 []  - 0 Patient Transfer (multiple staff / Civil Service fast streamer / Similar devices) []  - 0 Simple Staple / Suture removal (25 or less) []  - 0 Complex Staple / Suture removal (26 or more) []  - 0 Hypo/Hyperglycemic Management (do not check if billed separately) []  - 0 Ankle / Brachial Index (ABI) -  do not check if billed separately Has the patient been seen at the hospital within the last three years: Yes Total Score: 85 Level Of Care: New/Established - Level 3 Electronic Signature(s) Signed: 02/18/2018 5:02:25 PM By: Montey Hora Entered By: Montey Hora on 02/18/2018 16:31:41 Gregory Tyler ([id number]) -------------------------------------------------------------------------------- Encounter Discharge Information Details Patient Name: Gregory Tyler, Gregory Tyler. Date of Service: 02/18/2018 1:00 PM Medical Record Number: [id number] Patient Account Number: [id number] Date of Birth/Sex: 09-Apr-1941 (77 y.o. Male) Treating RN: Montey Hora Primary Care Bosco Paparella: Fulton Reek Other Clinician: Referring Turrell Severt: Frazier Richards Treating Paolina Karwowski/Extender: Melburn Hake, HOYT Weeks in Treatment: 0 Encounter Discharge Information Items Post Procedure Vitals Discharge Condition: Stable Temperature (F): 98.5 Ambulatory Status: Wheelchair Pulse (bpm): 91 Discharge Destination: [place] Respiratory Rate (breaths/min): 16 Telephoned: No Blood Pressure (mmHg): 116/63 Orders Sent: Yes Transportation: Private Auto Accompanied By: self Schedule Follow-up Appointment: Yes Clinical Summary of Care: Electronic Signature(s) Signed: 02/18/2018 4:35:33 PM By: Montey Hora Previous Signature: 02/18/2018 4:35:06 PM Version By: Montey Hora Entered By: Montey Hora on 02/18/2018 16:35:33 [name], [name] ([id number]) -------------------------------------------------------------------------------- Lower Extremity Assessment Details Patient Name: Gregory Tyler. Date of Service: 02/18/2018 1:00 PM Medical Record Number: [id number] Patient Account Number: [id number] Date of Birth/Sex: 12-03-1940 (77 y.o. Male) Treating RN: Cornell Barman Primary Care Catha Ontko: Fulton Reek Other Clinician: Referring Asher Babilonia: Frazier Richards Treating Tenlee Wollin/Extender: STONE III, HOYT Weeks in Treatment: 0 Vascular Assessment Pulses: Dorsalis Pedis Palpable: [Left:Yes] [Right:Yes] Posterior Tibial Palpable: [Left:Yes] [Right:Yes] Extremity colors, hair growth, and conditions: Extremity Color: [Left:Pale] [Right:Pale] Hair Growth on Extremity: [Left:No] [Right:No] Temperature of Extremity: [Left:Cool] [Right:Cool] Capillary Refill: [Left:> 3 seconds] [Right:> 3 seconds] Toe Nail Assessment Left: Right: Thick: Yes Yes Discolored: Yes Yes Deformed: Yes Yes Improper Length and Hygiene: Yes Yes Electronic Signature(s) Signed: 02/20/2018 7:26:08 AM By: Gretta Cool, BSN, RN, CWS, Kim RN, BSN Entered By: Gretta Cool, BSN, RN, CWS, Kim on 02/18/2018 14:00:54 Gregory Tyler ([id number]) -------------------------------------------------------------------------------- Pain Assessment Details Patient Name: Gregory Tyler, Gregory Tyler. Date of Service: 02/18/2018 1:00 PM Medical Record Number: [id number] Patient Account Number: [id number] Date of Birth/Sex: 1941/02/04 (77 y.o. Male) Treating RN: Cornell Barman Primary Care Kehinde Bowdish: Fulton Reek Other Clinician: Referring Lizmarie Witters: Frazier Richards Treating Paris Chiriboga/Extender: STONE III, HOYT Weeks in Treatment: 0 Active Problems Location of Pain Severity and Description of Pain Patient Has Paino Yes Site Locations Pain Location: Generalized Pain Rate the pain. Current Pain Level: 5 Pain Management and Medication Current Pain Management: Electronic Signature(s) Signed: 02/20/2018 7:26:08 AM By: Gretta Cool, BSN, RN, CWS, Kim RN, BSN Entered By: Gretta Cool, BSN, RN, CWS, Kim on 02/18/2018 13:44:35 Gregory Tyler  ([id number]) -------------------------------------------------------------------------------- Patient/Caregiver Education Details Patient Name: Gregory Tyler, Gregory Tyler Date of Service: 02/18/2018 1:00 PM Medical Record Number: [id number] Patient Account Number: [id number] Date of Birth/Gender: [birth year] (77 y.o. Male) Treating RN: Montey Hora Primary Care Physician: Fulton Reek Other Clinician: Referring Physician: Frazier Richards Treating Physician/Extender: Melburn Hake, HOYT Weeks in Treatment: 0 Education Assessment Education Provided To: Caregiver SNF nurses via written orders Education Topics Provided Wound/Skin Impairment: Handouts: Other: wound care orders Methods: Explain/Verbal Electronic Signature(s) Signed: 02/18/2018 5:02:25 PM By: Montey Hora Entered By: Montey Hora on 02/18/2018 16:32:30 Gregory Tyler ([id number]) -------------------------------------------------------------------------------- Wound Assessment Details Patient Name: Gregory Tyler. Date of Service: 02/18/2018 1:00 PM Medical Record Number: [id number] Patient Account Number: [id number] Date of Birth/Sex: 1941-01-31 (77 y.o. 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 Event: Pressure Injury Etiology: Date Acquired: 12/23/2017 Wound Open Weeks Of Treatment: 0 Status: Clustered Wound: No Comorbid Anemia, Arrhythmia, Hypertension, Type II History: Diabetes, End Stage Renal Disease, Received Radiation Photos Photo Uploaded By: Gretta Cool, BSN, RN, CWS, Kim on 02/18/2018 16:39:27 Wound Measurements Length: (cm) 0.5 Width: (cm) 0.5 Depth: (cm) 0.1 Area: (cm) 0.196 Volume: (cm)  0.02 % Reduction in Area: % Reduction in Volume: Epithelialization: None Tunneling: No Undermining: No Wound Description Classification: Category/Stage II Wound Margin: Flat and Intact Exudate Amount: Medium Exudate Type: Serous Exudate Color: amber Foul Odor After Cleansing: No Slough/Fibrino Yes Wound Bed Granulation Amount: Medium (34-66%) Exposed Structure Granulation Quality: Pink Fascia Exposed: No Necrotic Amount: Medium (34-66%) Fat Layer (Subcutaneous Tissue) Exposed: Yes Necrotic Quality: Adherent Slough Tendon Exposed: No Muscle Exposed: No Joint Exposed: No Bone Exposed: No BAYARD, MORE R. ([id number]) Periwound Skin Texture Texture Color No Abnormalities Noted: No No Abnormalities Noted: No Callus: Yes Atrophie Blanche: Yes Crepitus: Yes Cyanosis: Yes Excoriation: Yes Ecchymosis: Yes Induration: Yes Erythema: Yes Rash: Yes Hemosiderin Staining: Yes Scarring: Yes Mottled: Yes Pallor: Yes Moisture Rubor: Yes No Abnormalities Noted: No Dry / Scaly: Yes Maceration: Yes Wound Preparation Topical Anesthetic Applied: Other: lidocaine 4%, Treatment Notes Wound #1 (Left Calcaneus) 1. Cleansed with: Clean wound with Normal Saline 2. Anesthetic Topical Lidocaine 4% cream to wound bed prior to debridement Notes silvercel, gauze, conform Electronic Signature(s) Signed: 02/20/2018 7:26:08 AM By: Gretta Cool, BSN, RN, CWS, Kim RN, BSN Entered By: Gretta Cool, BSN, RN, CWS, Kim on 02/18/2018 13:50:47 Gregory Tyler, Gregory Tyler ([id number]) -------------------------------------------------------------------------------- Wound Assessment Details Patient Name: Gregory Tyler, Gregory Tyler. Date of Service: 02/18/2018 1:00 PM Medical Record Number: [id number] Patient Account Number: [id number] Date of Birth/Sex: May 06, 1940 (77 y.o. 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 Event: Pressure Injury Etiology: Date Acquired: 12/23/2017 Wound Open Weeks Of Treatment: 0 Status: Clustered Wound: No Comorbid Anemia, Arrhythmia, Hypertension, Type II History: Diabetes, End Stage Renal Disease, Received Radiation Photos Photo Uploaded By: Gretta Cool, BSN, RN, CWS, Kim on 02/18/2018 16:39:28 Wound Measurements Length: (cm) 0.3 Width: (cm) 0.5 Depth: (cm) 0.1 Area: (cm) 0.118 Volume: (cm) 0.012 % Reduction in Area: % Reduction in Volume: Epithelialization: None Tunneling: No Undermining: No Wound Description Classification: Unstageable/Unclassified Wound Margin: Indistinct, nonvisible Exudate Amount: None Present Foul Odor After Cleansing: No Slough/Fibrino No Wound Bed Granulation Amount: None Present (0%) Exposed Structure Necrotic Amount: Large (67-100%) Fascia Exposed: No Necrotic Quality: Eschar Fat Layer (Subcutaneous Tissue) Exposed: No Tendon Exposed: No Muscle Exposed: No Joint Exposed: No Bone Exposed: No Periwound Skin Texture Nierenberg, Eldon R. ([id number]) Texture Color No Abnormalities Noted: No No Abnormalities Noted: No Moisture No Abnormalities Noted: No Treatment Notes Wound #2 (Right Calcaneus) 1. Cleansed with: Clean wound with Normal Saline 2. Anesthetic Topical Lidocaine 4% cream to wound bed prior to debridement Notes silvercel, gauze, conform Electronic Signature(s) Signed: 02/20/2018 7:26:08 AM By: Gretta Cool, BSN, RN, CWS, Kim RN, BSN Entered By: Gretta Cool, BSN, RN, CWS, Kim on 02/18/2018 13:53:26 BILLY, TURVEY ([id number]) -------------------------------------------------------------------------------- Wound Assessment Details Patient Name: Gregory Tyler, Gregory Tyler. Date of Service: 02/18/2018 1:00  PM Medical Record Number: [id number] Patient Account Number: [id number] Date of Birth/Sex: 28-Nov-1940 (77  y.o. Male) Treating RN: Cornell Barman Primary Care Eason Housman: Fulton Reek Other Clinician: Referring Ronny Ruddell: Frazier Richards Treating Mira Balon/Extender: STONE III, HOYT Weeks in Treatment: 0 Wound Status Wound Number: 3 Primary Diabetic Wound/Ulcer of the Lower Extremity Etiology: Wound Location: Right Toe Great Wound Open Wounding Event: Pressure Injury Status: Date Acquired: 01/20/2018 Comorbid Anemia, Arrhythmia, Hypertension, Type II Weeks Of Treatment: 0 History: Diabetes, End Stage Renal Disease, Received Clustered Wound: No Radiation Photos Photo Uploaded By: Gretta Cool, BSN, RN, CWS, Kim on 02/18/2018 16:40:26 Wound Measurements Length: (cm) 4.57 Width: (cm) 2 Depth: (cm) 0.1 Area: (cm) 7.179 Volume: (cm) 0.718 % Reduction in Area: % Reduction in Volume: Epithelialization: None Tunneling: No Undermining: No Wound Description Classification: Unable to visualize wound bed Wound Margin: Indistinct, nonvisible Exudate Amount: None Present Foul Odor After Cleansing: No Slough/Fibrino No Wound Bed Granulation Amount: None Present (0%) Exposed Structure Necrotic Amount: Large (67-100%) Fascia Exposed: No Necrotic Quality: Eschar Fat Layer (Subcutaneous Tissue) Exposed: Yes Tendon Exposed: No Muscle Exposed: No Joint Exposed: No Bone Exposed: No Periwound Skin Texture Texture Color No Abnormalities Noted: No No Abnormalities Noted: No JAMEIR, AKE R. ([id number]) Moisture No Abnormalities Noted: No Treatment Notes Wound #3 (Right Toe Great) 1. Cleansed with: Clean wound with Normal Saline 2. Anesthetic Topical Lidocaine 4% cream to wound bed prior to debridement Notes santyl, gauze, conform Electronic Signature(s) Signed: 02/20/2018 7:26:08 AM By: Gretta Cool, BSN, RN, CWS, Kim RN, BSN Entered By: Gretta Cool, BSN, RN, CWS, Kim on 02/18/2018  13:55:00 Gregory Tyler, Gregory Tyler ([id number]) -------------------------------------------------------------------------------- Wound Assessment Details Patient Name: Gregory Tyler, Gregory Tyler. Date of Service: 02/18/2018 1:00 PM Medical Record Number: [id number] Patient Account Number: [id number] Date of Birth/Sex: Jul 25, 1940 (77 y.o. Male) Treating RN: Cornell Barman Primary Care Manu Rubey: Fulton Reek Other Clinician: Referring Mariadelcarmen Corella: Frazier Richards Treating Yves Fodor/Extender: STONE III, HOYT Weeks in Treatment: 0 Wound Status Wound Number: 4 Primary Diabetic Wound/Ulcer of the Lower Extremity Etiology: Wound Location: Right Toe Second Wound Open Wounding Event: Gradually Appeared Status: Date Acquired: 02/17/2018 Comorbid Anemia, Arrhythmia, Hypertension, Type II Weeks Of Treatment: 0 History: Diabetes, End Stage Renal Disease, Received Clustered Wound: No Radiation Photos Photo Uploaded By: Gretta Cool, BSN, RN, CWS, Kim on 02/18/2018 16:40:26 Wound Measurements Length: (cm) 0.5 Width: (cm) 0.6 Depth: (cm) 0.1 Area: (cm) 0.236 Volume: (cm) 0.024 % Reduction in Area: % Reduction in Volume: Epithelialization: None Tunneling: No Undermining: No Wound Description Classification: Unable to visualize wound bed Wound Margin: Indistinct, nonvisible Exudate Amount: None Present Foul Odor After Cleansing: No Slough/Fibrino No Wound Bed Granulation Amount: None Present (0%) Exposed Structure Necrotic Amount: Large (67-100%) Fascia Exposed: No Necrotic Quality: Eschar Fat Layer (Subcutaneous Tissue) Exposed: No Tendon Exposed: No Muscle Exposed: No Joint Exposed: No Bone Exposed: No Periwound Skin Texture Texture Color No Abnormalities Noted: No No Abnormalities Noted: No TREYVION, DURKEE R. ([id number]) Callus: No Atrophie Blanche: No Crepitus: No Cyanosis: No Excoriation: No Ecchymosis: No Induration: No Erythema: No Rash: No Hemosiderin Staining: No Scarring:  No Mottled: No Pallor: No Moisture Rubor: No No Abnormalities Noted: No Dry / Scaly: No Maceration: No Treatment Notes Wound #4 (Right Toe Second) 1. Cleansed with: Clean wound with Normal Saline 2. Anesthetic Topical Lidocaine 4% cream to wound bed prior to debridement Notes santyl, gauze, conform Electronic Signature(s) Signed: 02/20/2018 7:26:08 AM By: Gretta Cool, BSN, RN, CWS, Kim RN, BSN Entered By: Gretta Cool, BSN, RN, CWS, Kim on 02/18/2018 13:56:34 JERRIAN, MELLS ([id number]) -------------------------------------------------------------------------------- Wound Assessment Details Patient Name: RAINEY, KAHRS. Date of Service: 02/18/2018 1:00 PM Medical  Record Number: [id number] Patient Account Number: [id number] Date of Birth/Sex: September 26, 1940 (77 y.o. 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 Wound Open Wounding Event: Pressure Injury Status: Date Acquired: 01/20/2018 Comorbid Anemia, Arrhythmia, Hypertension, Type II Weeks Of Treatment: 0 History: Diabetes, End Stage Renal Disease, Received Clustered Wound: No Radiation Photos Wound Measurements Length: (cm) 0.7 % Reduction in Width: (cm) 0.6 % Reduction in Depth: (cm) 0.1 Epithelializat Area: (cm) 0.33 Tunneling: Volume: (cm) 0.033 Undermining: Area: 0% Volume: 0% ion: None No No Wound Description Classification: Category/Stage III Foul Odor Afte Wound Margin: Flat and Intact Slough/Fibrino Exudate Amount: Medium Exudate Type: Serous Exudate Color: amber r Cleansing: No No Wound Bed Granulation Amount: Medium (34-66%) Exposed Structure Granulation Quality: Pink Fascia Exposed: No Necrotic Amount: Medium (34-66%) Fat Layer (Subcutaneous Tissue) Exposed:  Yes Necrotic Quality: Adherent Slough Tendon Exposed: No Muscle Exposed: No Joint Exposed: No Bone Exposed: No Periwound Skin Texture Texture Color Vickerman, Khyrin R. ([id number]) No Abnormalities Noted: No No Abnormalities Noted: No Callus: No Atrophie Blanche: No Crepitus: No Cyanosis: No Excoriation: No Ecchymosis: No Induration: No Erythema: No Rash: No Hemosiderin Staining: No Scarring: No Mottled: No Pallor: No Moisture Rubor: No No Abnormalities Noted: No Dry / Scaly: No Maceration: No Treatment Notes Wound #5 (Left Gluteus) 1. Cleansed with: Clean wound with Normal Saline 2. Anesthetic Topical Lidocaine 4% cream to wound bed prior to debridement Notes silvercel, bordered foam dressing Electronic Signature(s) Signed: 02/19/2018 1:21:52 AM By: Worthy Keeler PA-C Signed: 02/20/2018 7:26:08 AM By: Gretta Cool, BSN, RN, CWS, Kim RN, BSN Entered By: Worthy Keeler on 02/19/2018 00:22:27 ARYAAN, PERSICHETTI ([id number]) -------------------------------------------------------------------------------- Wound Assessment Details Patient Name: BURDETT, PINZON. Date of Service: 02/18/2018 1:00 PM Medical Record Number: [id number] Patient Account Number: [id number] Date of Birth/Sex: [birth year] (77 y.o. Male) Treating RN: Cornell Barman Primary Care Haris Baack: Fulton Reek Other Clinician: Referring Etherine Mackowiak: Frazier Richards Treating Sarayu Prevost/Extender: STONE III, HOYT Weeks in Treatment: 0 Wound Status Wound Number: 6 Primary Pressure Ulcer Etiology: Wound Location: Sacrum - Medial Wound Open Wounding Event: Gradually Appeared Status: Date Acquired: 01/27/2018 Comorbid Anemia, Arrhythmia, Hypertension, Type II Weeks Of Treatment: 0 History: Diabetes, End Stage Renal Disease, Received Clustered Wound: No Radiation Photos Wound Measurements Length: (cm) 1.2 % Reduction in Width: (cm) 1.4 % Reduction in Depth: (cm) 0.3 Epithelializat Area: (cm)  1.319 Tunneling: Volume: (cm) 0.396 Undermining: Area: 0% Volume: 0% ion: None No No Wound Description Classification: Category/Stage III Foul Odor Afte Wound Margin: Flat and Intact Slough/Fibrino Exudate Amount: Large Exudate Type: Serous Exudate Color: amber r Cleansing: No Yes Wound Bed Granulation Amount: Medium (34-66%) Exposed Structure Granulation Quality: Pale Fascia Exposed: No Necrotic Amount: None Present (0%) Fat Layer (Subcutaneous Tissue) Exposed: Yes Tendon Exposed: No Muscle Exposed: No Joint Exposed: No Bone Exposed: No Periwound Skin Texture Texture Color Pant, Akito R. ([id number]) No Abnormalities Noted: No No Abnormalities Noted: No Moisture No Abnormalities Noted: No Treatment Notes Wound #6 (Medial Sacrum) 1. Cleansed with: Clean wound with Normal Saline 2. Anesthetic Topical Lidocaine 4% cream to wound bed prior to debridement Notes silvercel, bordered foam dressing Electronic Signature(s) Signed: 02/19/2018 1:21:52 AM By: Worthy Keeler PA-C Signed: 02/20/2018 7:26:08 AM By: Gretta Cool, BSN, RN, CWS, Kim RN, BSN Entered By: Worthy Keeler on 02/19/2018 00:22:52 GERRALD, BASU ([id number]) -------------------------------------------------------------------------------- Vitals Details Patient  Name: LUGENE, BEOUGHER. Date of Service: 02/18/2018 1:00 PM Medical Record Number: [id number] Patient Account Number: [id number] Date of Birth/Sex: 09/10/1940 (77 y.o. Male) Treating RN: Cornell Barman Primary Care Arcola Freshour: Fulton Reek Other Clinician: Referring Jsaon Yoo: Frazier Richards Treating Paulena Servais/Extender: STONE III, HOYT Weeks in Treatment: 0 Vital Signs Time Taken: 13:23 Temperature (F): 98.5 Height (in): 68 Pulse (bpm): 91 Weight (lbs): 168 Respiratory Rate (breaths/min): 16 Body Mass Index (BMI): 25.5 Blood Pressure (mmHg): 116/63 Reference Range: 80 - 120 mg / dl Electronic Signature(s) Signed: 02/20/2018  7:26:08 AM By: Gretta Cool, BSN, RN, CWS, Kim RN, BSN Entered By: Gretta Cool, BSN, RN, CWS, Kim on 02/18/2018 13:24:13

## 2018-02-22 NOTE — Progress Notes (Signed)
RIDDIK, SENNA ([id number]) Visit Report for 02/18/2018 Abuse/Suicide Risk Screen Details Patient Name: Gregory Tyler, Gregory Tyler. Date of Service: 02/18/2018 1:00 PM Medical Record Number: [id number] Patient Account Number: [id number] Date of Birth/Sex: February 17, 1941 (77 y.o. Male) Treating RN: Cornell Barman Primary Care Keri Tavella: Fulton Reek Other Clinician: Referring Betzaira Mentel: Frazier Richards Treating Fox Salminen/Extender: STONE III, HOYT Weeks in Treatment: 0 Abuse/Suicide Risk Screen Items Answer ABUSE/SUICIDE RISK SCREEN: Has anyone close to you tried to hurt or harm you recentlyo No Do you feel uncomfortable with anyone in your familyo No Has anyone forced you do things that you didnot want to doo No Do you have any thoughts of harming yourselfo No Patient displays signs or symptoms of abuse and/or neglect. No Electronic Signature(s) Signed: 02/20/2018 7:26:08 AM By: Gretta Cool, BSN, RN, CWS, Kim RN, BSN Entered By: Gretta Cool, BSN, RN, CWS, Kim on 02/18/2018 13:42:13 RAHIM, ASTORGA ([id number]) -------------------------------------------------------------------------------- Activities of Daily Living Details Patient Name: Gregory Tyler, Gregory Tyler. Date of Service: 02/18/2018 1:00 PM Medical Record Number: [id number] Patient Account Number: [id number] Date of Birth/Sex: Oct 30, 1940 (77 y.o. Male) Treating RN: Cornell Barman Primary Care Montae Stager: Fulton Reek Other Clinician: Referring Cariann Kinnamon: Frazier Richards Treating Meleane Selinger/Extender: Melburn Hake, HOYT Weeks in Treatment: 0 Activities of Daily Living Items Answer Activities of Daily Living (Please select one for each item) Drive Automobile Not Able Take Medications Not Able Use Telephone Not Able Care for Appearance Not Able Use Toilet Not Able Manus Rudd / Shower Not Able Dress Self Not Able Feed Self Not Able Walk Not Able Get In / Out Bed Not Able Housework Not Able Prepare Meals Not Able Handle Money Not Able Shop for Self Not  Able Electronic Signature(s) Signed: 02/20/2018 7:26:08 AM By: Gretta Cool, BSN, RN, CWS, Kim RN, BSN Entered By: Gretta Cool, BSN, RN, CWS, Kim on 02/18/2018 13:42:33 WYNSTON, ROMEY ([id number]) -------------------------------------------------------------------------------- Education Assessment Details Patient Name: Gregory Tyler, Gregory Tyler. Date of Service: 02/18/2018 1:00 PM Medical Record Number: [id number] Patient Account Number: [id number] Date of Birth/Sex: May 25, 1940 (77 y.o. Male) Treating RN: Cornell Barman Primary Care Kashawn Manzano: Fulton Reek Other Clinician: Referring Estanislado Surgeon: Frazier Richards Treating Deni Berti/Extender: Melburn Hake, HOYT Weeks in Treatment: 0 Primary Learner Assessed: Patient Learning Preferences/Education Level/Primary Language Learning Preference: Demonstration Highest Education Level: High School Preferred Language: English Cognitive Barrier Assessment/Beliefs Language Barrier: No Translator Needed: No Memory Deficit: No Emotional Barrier: No Cultural/Religious Beliefs Affecting Medical Care: No Knowledge/Comprehension Assessment Knowledge Level: Medium Comprehension Level: Medium Ability to understand written Medium instructions: Ability to understand verbal Medium instructions: Motivation Assessment Anxiety Level: Calm Cooperation: Cooperative Education Importance: Acknowledges Need Interest in Health Problems: Asks Questions Perception: Coherent Willingness to Engage in Self- High Management Activities: Readiness to Engage in Self- High Management Activities: Electronic Signature(s) Signed: 02/20/2018 7:26:08 AM By: Gretta Cool, BSN, RN, CWS, Kim RN, BSN Entered By: Gretta Cool, BSN, RN, CWS, Kim on 02/18/2018 13:42:56 ESMERALDA, BLANFORD ([id number]) -------------------------------------------------------------------------------- Fall Risk Assessment Details Patient Name: Gregory Tyler. Date of Service: 02/18/2018 1:00 PM Medical Record Number:  [id number] Patient Account Number: [id number] Date of Birth/Sex: [birth year] (77 y.o. Male) Treating RN: Cornell Barman Primary Care Sameen Leas: Fulton Reek Other Clinician: Referring Yoshiye Kraft: Frazier Richards Treating Lizann Edelman/Extender: Melburn Hake, HOYT Weeks in Treatment: 0 Fall Risk Assessment Items Have you had 2 or more falls in the last 12 monthso 0 Yes Have you had any fall that resulted in injury in the last 12 monthso 0 No FALL RISK ASSESSMENT: History of falling - immediate or within 3 months 25 Yes Secondary diagnosis 0 No Ambulatory  aid None/bed rest/wheelchair/nurse 0 Yes Crutches/cane/walker 0 No Furniture 0 No IV Access/Saline Lock 0 No Gait/Training Normal/bed rest/immobile 0 Yes Weak 0 No Impaired 0 No Mental Status Oriented to own ability 0 No Electronic Signature(s) Signed: 02/20/2018 7:26:08 AM By: Gretta Cool, BSN, RN, CWS, Kim RN, BSN Entered By: Gretta Cool, BSN, RN, CWS, Kim on 02/18/2018 13:43:16 Farra, [name] ([id number]) -------------------------------------------------------------------------------- Foot Assessment Details Patient Name: Gregory Tyler, Gregory Tyler. Date of Service: 02/18/2018 1:00 PM Medical Record Number: [id number] Patient Account Number: [id number] Date of Birth/Sex: May 09, 1940 (77 y.o. Male) Treating RN: Cornell Barman Primary Care Lora Chavers: Fulton Reek Other Clinician: Referring Sequoia Mincey: Frazier Richards Treating Kiylee Thoreson/Extender: STONE III, HOYT Weeks in Treatment: 0 Foot Assessment Items Site Locations + = Sensation present, - = Sensation absent, C = Callus, U = Ulcer R = Redness, W = Warmth, M = Maceration, PU = Pre-ulcerative lesion F = Fissure, S = Swelling, D = Dryness Assessment Right: Left: Other Deformity: No No Prior Foot Ulcer: No No Prior Amputation: No No Charcot Joint: No No Ambulatory Status: Ambulatory With Help Assistance Device: Wheelchair Gait: Administrator, arts) Signed: 02/20/2018 7:26:08 AM By:  Gretta Cool, BSN, RN, CWS, Kim RN, BSN Entered By: Gretta Cool, BSN, RN, CWS, Kim on 02/18/2018 13:44:13 Gregory Tyler ([id number]) -------------------------------------------------------------------------------- Nutrition Risk Assessment Details Patient Name: Gregory Tyler, Gregory Tyler. Date of Service: 02/18/2018 1:00 PM Medical Record Number: [id number] Patient Account Number: [id number] Date of Birth/Sex: 08-May-1940 (77 y.o. Male) Treating RN: Cornell Barman Primary Care Sacheen Arrasmith: Fulton Reek Other Clinician: Referring Deontray Hunnicutt: Frazier Richards Treating Cinderella Christoffersen/Extender: STONE III, HOYT Weeks in Treatment: 0 Height (in): 68 Weight (lbs): 168 Body Mass Index (BMI): 25.5 Nutrition Risk Assessment Items NUTRITION RISK SCREEN: I have an illness or condition that made me change the kind and/or amount of 0 No food I eat I eat fewer than two meals per day 0 No I eat few fruits and vegetables, or milk products 0 No I have three or more drinks of beer, liquor or wine almost every day 0 No I have tooth or mouth problems that make it hard for me to eat 0 No I don't always have enough money to buy the food I need 0 No I eat alone most of the time 0 No I take three or more different prescribed or over-the-counter drugs a day 0 No Without wanting to, I have lost or gained 10 pounds in the last six months 0 No I am not always physically able to shop, cook and/or feed myself 0 No Nutrition Protocols Good Risk Protocol Provide education on Moderate Risk Protocol 0 nutrition Electronic Signature(s) Signed: 02/20/2018 7:26:08 AM By: Gretta Cool, BSN, RN, CWS, Kim RN, BSN Entered By: Gretta Cool, BSN, RN, CWS, Kim on 02/18/2018 13:43:25

## 2018-02-22 NOTE — Progress Notes (Signed)
Gregory Tyler, Gregory Tyler ([id number]) Visit Report for 02/18/2018 Chief Complaint Document Details Patient Name: Gregory Tyler. Date of Service: 02/18/2018 1:00 PM Medical Record Number: [id number] Patient Account Number: [id number] Date of Birth/Sex: 05-28-1940 (77 y.o. Male) Treating RN: Montey Hora Primary Care Provider: Fulton Reek Other Clinician: Referring Provider: Frazier Richards Treating Provider/Extender: Melburn Hake, HOYT Weeks in Treatment: 0 Information Obtained from: Patient Chief Complaint Multiple pressure ulcers of the bilateral LEs and gluteal/sacral region Electronic Signature(s) Signed: 02/19/2018 1:21:52 AM By: Worthy Keeler PA-C Entered By: Worthy Keeler on 02/19/2018 00:24:35 Gregory Tyler, Gregory Tyler ([id number]) -------------------------------------------------------------------------------- Debridement Details Patient Name: Gregory Tyler. Date of Service: 02/18/2018 1:00 PM Medical Record Number: [id number] Patient Account Number: [id number] Date of Birth/Sex: 1941/03/17 (77 y.o. Male) Treating RN: Cornell Barman Primary Care Provider: Fulton Reek Other Clinician: Referring Provider: Frazier Richards Treating Provider/Extender: Melburn Hake, HOYT Weeks in Treatment: 0 Debridement Performed for Wound #3 Right Toe Great Assessment: Performed By: Physician STONE III, HOYT E., PA-C Debridement Type: Debridement Severity of Tissue Pre Fat layer exposed Debridement: Level of Consciousness (Pre- Awake and Alert procedure): Pre-procedure Verification/Time Yes - 14:00 Out Taken: Start Time: 14:00 Total Area Debrided (L x W): 4.57 (cm) x 2 (cm) = 9.14 (cm) Tissue and other material Viable, Non-Viable, Slough, Subcutaneous, Slough debrided: Level: Skin/Subcutaneous Tissue Debridement Description: Excisional Instrument: Forceps, Scissors Bleeding: Minimum Hemostasis Achieved: Pressure End Time: 14:05 Procedural Pain: 0 Post Procedural Pain:  0 Response to Treatment: Procedure was tolerated well Level of Consciousness Awake and Alert (Post-procedure): Post Debridement Measurements of Total Wound Length: (cm) 4.5 Width: (cm) 2 Depth: (cm) 0.1 Volume: (cm) 0.707 Character of Wound/Ulcer Post Debridement: Stable Severity of Tissue Post Debridement: Fat layer exposed Post Procedure Diagnosis Same as Pre-procedure Electronic Signature(s) Signed: 02/19/2018 1:21:52 AM By: Worthy Keeler PA-C Signed: 02/20/2018 7:26:08 AM By: Gretta Cool, BSN, RN, CWS, Kim RN, BSN Entered By: Gretta Cool, BSN, RN, CWS, Kim on 02/18/2018 14:02:35 Gregory Tyler, Gregory Tyler ([id number]) -------------------------------------------------------------------------------- Debridement Details Patient Name: Gregory Tyler. Date of Service: 02/18/2018 1:00 PM Medical Record Number: [id number] Patient Account Number: [id number] Date of Birth/Sex: 05/30/1940 (77 y.o. Male) Treating RN: Cornell Barman Primary Care Provider: Fulton Reek Other Clinician: Referring Provider: Frazier Richards Treating Provider/Extender: Melburn Hake, HOYT Weeks in Treatment: 0 Debridement Performed for Wound #4 Right Toe Second Assessment: Performed By: Physician STONE III, HOYT E., PA-C Debridement Type: Debridement Severity of Tissue Pre Fat layer exposed Debridement: Level of Consciousness (Pre- Awake and Alert procedure): Pre-procedure Verification/Time Yes - 14:00 Out Taken: Start Time: 14:00 Total Area Debrided (L x W): 0.5 (cm) x 0.6 (cm) = 0.3 (cm) Tissue and other material Viable, Non-Viable, Slough, Subcutaneous, Slough debrided: Level: Skin/Subcutaneous Tissue Debridement Description: Excisional Instrument: Forceps, Scissors Bleeding: Minimum Hemostasis Achieved: Pressure End Time: 14:05 Procedural Pain: 0 Post Procedural Pain: 0 Response to Treatment: Procedure was tolerated well Level of Consciousness Awake and Alert (Post-procedure): Post Debridement  Measurements of Total Wound Length: (cm) 0.5 Width: (cm) 0.6 Depth: (cm) 0.1 Volume: (cm) 0.024 Character of Wound/Ulcer Post Debridement: Stable Severity of Tissue Post Debridement: Fat layer exposed Post Procedure Diagnosis Same as Pre-procedure Electronic Signature(s) Signed: 02/19/2018 1:21:52 AM By: Worthy Keeler PA-C Signed: 02/20/2018 7:26:08 AM By: Gretta Cool, BSN, RN, CWS, Kim RN, BSN Entered By: Gretta Cool, BSN, RN, CWS, Kim on 02/18/2018 14:03:12 Gregory Tyler, Gregory Tyler ([id number]) -------------------------------------------------------------------------------- Debridement Details Patient Name: Gregory Tyler, Gregory Tyler. Date of Service: 02/18/2018 1:00 PM Medical Record Number: [id number] Patient Account Number: [id number] Date of Birth/Sex:  01-01-1941 (77 y.o. Male) Treating RN: Cornell Barman Primary Care Provider: Fulton Reek Other Clinician: Referring Provider: Frazier Richards Treating Provider/Extender: Melburn Hake, HOYT Weeks in Treatment: 0 Debridement Performed for Wound #5 Left Gluteus Assessment: Performed By: Physician STONE III, HOYT E., PA-C Debridement Type: Debridement Level of Consciousness (Pre- Awake and Alert procedure): Pre-procedure Verification/Time Yes - 14:00 Out Taken: Start Time: 14:00 Total Area Debrided (L x W): 0.7 (cm) x 0.6 (cm) = 0.42 (cm) Tissue and other material Viable, Non-Viable, Slough, Subcutaneous, Skin: Dermis , Slough debrided: Level: Skin/Subcutaneous Tissue Debridement Description: Excisional Instrument: Forceps, Scissors Bleeding: Minimum Hemostasis Achieved: Pressure End Time: 14:05 Procedural Pain: 0 Post Procedural Pain: 0 Response to Treatment: Procedure was tolerated well Level of Consciousness Awake and Alert (Post-procedure): Post Debridement Measurements of Total Wound Length: (cm) 0.7 Stage: Category/Stage II Width: (cm) 0.6 Depth: (cm) 0.2 Volume: (cm) 0.066 Character of Wound/Ulcer  Post Stable Debridement: Post Procedure Diagnosis Same as Pre-procedure Electronic Signature(s) Signed: 02/19/2018 1:21:52 AM By: Worthy Keeler PA-C Signed: 02/20/2018 7:26:08 AM By: Gretta Cool, BSN, RN, CWS, Kim RN, BSN Entered By: Gretta Cool, BSN, RN, CWS, Kim on 02/18/2018 14:04:33 Gregory Tyler, Gregory Tyler ([id number]) -------------------------------------------------------------------------------- HPI Details Patient Name: Gregory Tyler, Gregory Tyler. Date of Service: 02/18/2018 1:00 PM Medical Record Number: [id number] Patient Account Number: [id number] Date of Birth/Sex: [birth year] (77 y.o. Male) Treating RN: Montey Hora Primary Care Provider: Fulton Reek Other Clinician: Referring Provider: Frazier Richards Treating Provider/Extender: Melburn Hake, HOYT Weeks in Treatment: 0 History of Present Illness HPI Description: 02/18/18 patient presents today for initial evaluation concerning multiple ulcers that he has over the bilateral lower extremities as well as the sacral and left gluteal region. 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 Prior to my valuation today the patient was seen by Dr. Vickki Muff in the last office visit note I have is from 01/02/18. At that point sharp debridement was performed of the wound sites. [facility] He has also been a patient of Dr. dew and has had recent angiograms of the bilateral lower extremities. Patient had injured [place] of the right lower extremity which was performed on June 01/23/18. The left lower extremity procedure was performed on 01/30/18. Patient tolerated the procedure as well and according to Dr. Bunnie Domino notes which were reviewed it appears that blood flow was improved to what degree  it could be in regard to the bilateral lower extremities. The patient actually did seem to have decent blood flow today. To complement this it also seems that since the procedure the patient's wounds have been improving according to what he tells me today and in fact upon visual inspection his wounds do seem to be better. Currently there are a couple areas there are going to require some debridement but fortunately nothing too significant. No fevers, chills, nausea, or vomiting noted at this time. Electronic Signature(s) Signed: 02/19/2018 1:21:52 AM By: Worthy Keeler PA-C Entered By: Worthy Keeler on 02/19/2018 00:34:28 Gregory Tyler, Gregory Tyler ([id number]) -------------------------------------------------------------------------------- Physical Exam Details Patient Name: Gregory Tyler, Gregory Tyler. Date of Service: 02/18/2018 1:00 PM Medical Record Number: [id number] Patient Account Number: [id number] Date of Birth/Sex: 29-Jan-1941 (77 y.o. Male) Treating RN: Montey Hora Primary Care Provider: Fulton Reek Other Clinician: Referring Provider: Frazier Richards Treating Provider/Extender: STONE III, HOYT Weeks in Treatment: 0 Constitutional supine blood  pressure is within target range for patient.. pulse regular and within target range for patient.Marland Kitchen respirations regular, non-labored and within target range for patient.Marland Kitchen temperature within target range for patient.. Chronically ill appearing but in no apparent acute distress. Eyes conjunctiva clear no eyelid edema noted. pupils equal round and reactive to light and accommodation. Ears, Nose, Mouth, and Throat no gross abnormality of ear auricles or external auditory canals. normal hearing noted during conversation. mucus membranes moist. Respiratory normal breathing without difficulty. clear to auscultation bilaterally. Cardiovascular regular rate and rhythm with normal S1, S2. Absent posterior tibial and dorsalis pedis pulses bilateral  lower extremities. no clubbing, cyanosis, significant edema, <3 sec cap refill. Gastrointestinal (GI) soft, non-tender, non-distended, +BS. no ventral hernia noted. Musculoskeletal Patient unable to walk without assistance. Psychiatric this patient is able to make decisions and demonstrates good insight into disease process. Alert and Oriented x 3. pleasant and cooperative. Notes Patient's wound beds currently did require some sharp debridement especially in regard to the toe ulcers noted on the right first and second toes. I did remove the eschar covering the surface of the wounds. Patient had minimal bleeding. Post debridement the wound bed appears to be doing significantly better which is great news. In regard to the sacral and left gluteal regions actually perform sharp debridement of both locations to remove slough and necrotic material from the surface of the wounds. He tolerated this without complication. Post debridement all areas appear better. Electronic Signature(s) Signed: 02/19/2018 1:21:52 AM By: Worthy Keeler PA-C Entered By: Worthy Keeler on 02/19/2018 00:36:02 KEMONI, ORTEGA ([id number]) -------------------------------------------------------------------------------- Physician Orders Details Patient Name: Gregory Tyler, Gregory Tyler. Date of Service: 02/18/2018 1:00 PM Medical Record Number: [id number] Patient Account Number: [id number] Date of Birth/Sex: Sep 12, 1940 (76 y.o. Male) Treating RN: Cornell Barman Primary Care Provider: Fulton Reek Other Clinician: Referring Provider: Frazier Richards Treating Provider/Extender: Melburn Hake, HOYT Weeks in Treatment: 0 Verbal / Phone Orders: No Diagnosis Coding Wound Cleansing Wound #1 Left Calcaneus o Clean wound with Normal Saline. o Cleanse wound with mild soap and water Wound #2 Right Calcaneus o Clean wound with Normal Saline. o Cleanse wound with mild soap and water Wound #3 Right Toe Great o Clean wound  with Normal Saline. o Cleanse wound with mild soap and water Wound #4 Right Toe Second o Clean wound with Normal Saline. o Cleanse wound with mild soap and water Wound #5 Left Gluteus o Clean wound with Normal Saline. o Cleanse wound with mild soap and water Wound #6 Medial Sacrum o Clean wound with Normal Saline. o Cleanse wound with mild soap and water Primary Wound Dressing Wound #1 Left Calcaneus o Silver Alginate Wound #2 Right Calcaneus o Silver Alginate Wound #5 Left Gluteus o Silver Alginate Wound #6 Medial Sacrum o Silver Alginate Wound #3 Right Toe Great o Santyl Ointment Wound #4 Right Toe Second o Santyl Ointment Secondary Dressing Gregory Tyler, Gregory Tyler ([id number]) Wound #1 Left Calcaneus o Gauze and Kerlix/Conform Wound #2 Right Calcaneus o Gauze and Kerlix/Conform Wound #3 Right Toe Great o Gauze and Kerlix/Conform Wound #4 Right Toe Second o Gauze and Kerlix/Conform Wound #5 Left Gluteus o Boardered Foam Dressing Wound #6 Medial Sacrum o Boardered Foam Dressing Dressing Change Frequency Wound #1 Left Calcaneus o Change Dressing Monday, Wednesday, Friday Wound #2 Right Calcaneus o Change Dressing Monday, Wednesday, Friday Wound #5 Left Gluteus o Change Dressing Monday, Wednesday, Friday Wound #6 Medial Sacrum o Change Dressing Monday, Wednesday, Friday Wound #3 Right Toe Great o Change  dressing every day. Wound #4 Right Toe Second o Change dressing every day. Follow-up Appointments Wound #1 Left Calcaneus o Return Appointment in 2 weeks. Wound #2 Right Calcaneus o Return Appointment in 2 weeks. Wound #3 Right Toe Great o Return Appointment in 2 weeks. Wound #4 Right Toe Second o Return Appointment in 2 weeks. Wound #5 Left Gluteus o Return Appointment in 2 weeks. Wound #6 Medial Sacrum o Return Appointment in 2 weeks. Additional Orders / Instructions Gregory Tyler, Gregory Tyler  ([id number]) Wound #1 Left Calcaneus o Increase protein intake. Wound #2 Right Calcaneus o Increase protein intake. Wound #3 Right Toe Great o Increase protein intake. Wound #4 Right Toe Second o Increase protein intake. Wound #5 Left Gluteus o Increase protein intake. Wound #6 Medial Sacrum o Increase protein intake. Electronic Signature(s) Signed: 02/19/2018 1:21:52 AM By: Worthy Keeler PA-C Signed: 02/20/2018 7:26:08 AM By: Gretta Cool, BSN, RN, CWS, Kim RN, BSN Entered By: Gretta Cool, BSN, RN, CWS, Kim on 02/18/2018 14:07:27 Gregory Tyler, Gregory Tyler ([id number]) -------------------------------------------------------------------------------- Problem List Details Patient Name: EMRY, BARBATO. Date of Service: 02/18/2018 1:00 PM Medical Record Number: [id number] Patient Account Number: [id number] Date of Birth/Sex: [birth year] (77 y.o. Male) Treating RN: Montey Hora Primary Care Provider: Fulton Reek Other Clinician: Referring Provider: Frazier Richards Treating Provider/Extender: Melburn Hake, HOYT Weeks in Treatment: 0 Active Problems ICD-10 Evaluated Encounter Code Description Active Date Today Diagnosis 7155814020 Pressure ulcer of left heel, stage 2 02/19/2018 No Yes L89.610 Pressure ulcer of right heel, unstageable 02/19/2018 No Yes E11.621 Type 2 diabetes mellitus with foot ulcer 02/19/2018 No Yes L97.512 Non-pressure chronic ulcer of other part of right foot with fat 02/19/2018 No Yes layer exposed L89.323 Pressure ulcer of left buttock, stage 3 02/19/2018 No Yes L89.153 Pressure ulcer of sacral region, stage 3 02/19/2018 No Yes I73.89 Other specified peripheral vascular diseases 02/19/2018 No Yes Inactive Problems Resolved Problems Electronic Signature(s) Signed: 02/19/2018 1:21:52 AM By: Worthy Keeler PA-C Entered By: Worthy Keeler on 02/19/2018 00:24:05 Gregory Tyler  ([id number]) -------------------------------------------------------------------------------- Progress Note Details Patient Name: Gregory Tyler. Date of Service: 02/18/2018 1:00 PM Medical Record Number: [id number] Patient Account Number: [id number] Date of Birth/Sex: 16-Aug-1940 (77 y.o. Male) Treating RN: Montey Hora Primary Care Provider: Fulton Reek Other Clinician: Referring Provider: Frazier Richards Treating Provider/Extender: Melburn Hake, HOYT Weeks in Treatment: 0 Subjective Chief Complaint Information obtained from Patient Multiple pressure ulcers of the bilateral LEs and gluteal/sacral region History of Present Illness (HPI) 02/18/18 patient presents today for initial evaluation concerning multiple ulcers that he has over the bilateral lower extremities as well as the sacral and left gluteal region. 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 Prior to my valuation today the patient was seen by Dr. Vickki Muff in the last office visit note I have is from 01/02/18. At that point sharp debridement was performed of the wound sites. [facility] He has also been a patient of Dr. dew and has had recent angiograms of the bilateral lower extremities. Patient had injured [place] of the right lower extremity which was performed on June 01/23/18. The left lower extremity procedure was performed on 01/30/18. Patient tolerated the procedure as well and according to Dr. Bunnie Domino notes which were reviewed it appears that blood flow was improved to what degree it could be in  regard to the bilateral lower extremities. The patient actually did seem to have decent blood flow today. To complement this it also seems that since the  procedure the patient's wounds have been improving according to what he tells me today and in fact upon visual inspection his wounds do seem to be better. Currently there are a couple areas there are going to require some debridement but fortunately nothing too significant. No fevers, chills, nausea, or vomiting noted at this time. Wound History Patient presents with 6 open wounds that have been present for approximately 1year. Patient has been treating wounds in the following manner: betadine. Laboratory tests have not been performed in the last month. Patient reportedly has not tested positive for an antibiotic resistant organism. Patient reportedly has not tested positive for osteomyelitis. Patient reportedly has had testing performed to evaluate circulation in the legs. Patient History Information obtained from Patient, Chart. Allergies codeine, aspirin Family History Heart Disease - Mother, Hypertension, No family history of Cancer, Diabetes, Kidney Disease, Lung Disease, Seizures, Stroke, Thyroid Problems, Tuberculosis. Social History Former smoker, Marital Status - Married, Alcohol Use - Never, Drug Use - No History, Caffeine Use - Daily. Medical History Eyes Denies history of Cataracts Ear/Nose/Mouth/Throat Denies history of Chronic sinus problems/congestion Hematologic/Lymphatic JERIAN, MORAIS ([id number]) Patient has history of Anemia Denies history of Hemophilia, Human Immunodeficiency Virus, Lymphedema, Sickle Cell Disease Cardiovascular Patient has history of Arrhythmia, Hypertension Denies history of Angina, Congestive Heart Failure, Coronary Artery Disease, Deep Vein Thrombosis, Hypotension, Myocardial Infarction, Peripheral Arterial Disease, Peripheral Venous Disease, Phlebitis, Vasculitis Gastrointestinal Denies history of Cirrhosis , Colitis, Crohn s, Hepatitis A, Hepatitis B, Hepatitis C Endocrine Patient has history of Type II Diabetes Denies history of Type  I Diabetes Genitourinary Patient has history of End Stage Renal Disease Immunological Denies history of Lupus Erythematosus, Raynaud s, Scleroderma Oncologic Patient has history of Received Radiation Denies history of Received Chemotherapy Patient is treated with Oral Agents. Review of Systems (ROS) Constitutional Symptoms (General Health) The patient has no complaints or symptoms. Eyes The patient has no complaints or symptoms. Ear/Nose/Mouth/Throat The patient has no complaints or symptoms. Hematologic/Lymphatic The patient has no complaints or symptoms. Respiratory The patient has no complaints or symptoms. Cardiovascular The patient has no complaints or symptoms. Gastrointestinal Complains or has symptoms of Frequent diarrhea. Denies complaints or symptoms of Nausea, Vomiting. Endocrine The patient has no complaints or symptoms. Genitourinary Complains or has symptoms of Kidney failure/ Dialysis. Immunological The patient has no complaints or symptoms. Musculoskeletal The patient has no complaints or symptoms. Neurologic The patient has no complaints or symptoms. Oncologic The patient has no complaints or symptoms, Prostate Cancer Objective FERNANDEZ, KENLEY R. ([id number]) Constitutional supine blood pressure is within target range for patient.. pulse regular and within target range for patient.Marland Kitchen respirations regular, non-labored and within target range for patient.Marland Kitchen temperature within target range for patient.. Chronically ill appearing but in no apparent acute distress. Vitals Time Taken: 1:23 PM, Height: 68 in, Weight: 168 lbs, BMI: 25.5, Temperature: 98.5 Gregory Tyler, Pulse: 91 bpm, Respiratory Rate: 16 breaths/min, Blood Pressure: 116/63 mmHg. Eyes conjunctiva clear no eyelid edema noted. pupils equal round and reactive to light and accommodation. Ears, Nose, Mouth, and Throat no gross abnormality of ear auricles or external auditory canals. normal hearing noted during  conversation. mucus membranes moist. Respiratory normal breathing without difficulty. clear to auscultation bilaterally. Cardiovascular regular rate and rhythm with normal S1, S2. Absent posterior tibial and dorsalis pedis pulses bilateral lower extremities. no clubbing, cyanosis, significant  edema, Gastrointestinal (GI) soft, non-tender, non-distended, +BS. no ventral hernia noted. Musculoskeletal Patient unable to walk without assistance. Psychiatric this patient is able to make decisions and demonstrates good insight into disease process. Alert and Oriented x 3. pleasant and cooperative. General Notes: Patient's wound beds currently did require some sharp debridement especially in regard to the toe ulcers noted on the right first and second toes. I did remove the eschar covering the surface of the wounds. Patient had minimal bleeding. Post debridement the wound bed appears to be doing significantly better which is great news. In regard to the sacral and left gluteal regions actually perform sharp debridement of both locations to remove slough and necrotic material from the surface of the wounds. He tolerated this without complication. Post debridement all areas appear better. Integumentary (Hair, Skin) Wound #1 status is Open. Original cause of wound was Pressure Injury. The wound is located on the Left Calcaneus. The wound measures 0.5cm length x 0.5cm width x 0.1cm depth; 0.196cm^2 area and 0.02cm^3 volume. There is Fat Layer (Subcutaneous Tissue) Exposed exposed. There is no tunneling or undermining noted. There is a medium amount of serous drainage noted. The wound margin is flat and intact. There is medium (34-66%) pink granulation within the wound bed. There is a medium (34-66%) amount of necrotic tissue within the wound bed including Adherent Slough. The periwound skin appearance exhibited: Callus, Crepitus, Excoriation, Induration, Rash, Scarring, Dry/Scaly, Maceration, Atrophie  Blanche, Cyanosis, Ecchymosis, Hemosiderin Staining, Mottled, Pallor, Rubor, Erythema. The surrounding wound skin color is noted with erythema. Wound #2 status is Open. Original cause of wound was Pressure Injury. The wound is located on the Right Calcaneus. The wound measures 0.3cm length x 0.5cm width x 0.1cm depth; 0.118cm^2 area and 0.012cm^3 volume. There is no tunneling or undermining noted. There is a none present amount of drainage noted. The wound margin is indistinct and nonvisible. There is no granulation within the wound bed. There is a large (67-100%) amount of necrotic tissue within the wound bed including Eschar. Wound #3 status is Open. Original cause of wound was Pressure Injury. The wound is located on the Right Toe Great. The wound measures 4.57cm length x 2cm width x 0.1cm depth; 7.179cm^2 area and 0.718cm^3 volume. There is Fat Layer (Subcutaneous Tissue) Exposed exposed. There is no tunneling or undermining noted. There is a none present amount of drainage noted. The wound margin is indistinct and nonvisible. There is no granulation within the wound bed. There is a large Penalver, Vester R. ([id number]) (67-100%) amount of necrotic tissue within the wound bed including Eschar. Wound #4 status is Open. Original cause of wound was Gradually Appeared. The wound is located on the Right Toe Second. The wound measures 0.5cm length x 0.6cm width x 0.1cm depth; 0.236cm^2 area and 0.024cm^3 volume. There is no tunneling or undermining noted. There is a none present amount of drainage noted. The wound margin is indistinct and nonvisible. There is no granulation within the wound bed. There is a large (67-100%) amount of necrotic tissue within the wound bed including Eschar. The periwound skin appearance did not exhibit: Callus, Crepitus, Excoriation, Induration, Rash, Scarring, Dry/Scaly, Maceration, Atrophie Blanche, Cyanosis, Ecchymosis, Hemosiderin Staining, Mottled, Pallor,  Rubor, Erythema. Wound #5 status is Open. Original cause of wound was Pressure Injury. The wound is located on the Left Gluteus. The wound measures 0.7cm length x 0.6cm width x 0.1cm depth; 0.33cm^2 area and 0.033cm^3 volume. There is Fat Layer (Subcutaneous Tissue) Exposed exposed. There is no tunneling or  undermining noted. There is a medium amount of serous drainage noted. The wound margin is flat and intact. There is medium (34-66%) pink granulation within the wound bed. There is a medium (34-66%) amount of necrotic tissue within the wound bed including Adherent Slough. The periwound skin appearance did not exhibit: Callus, Crepitus, Excoriation, Induration, Rash, Scarring, Dry/Scaly, Maceration, Atrophie Blanche, Cyanosis, Ecchymosis, Hemosiderin Staining, Mottled, Pallor, Rubor, Erythema. Wound #6 status is Open. Original cause of wound was Gradually Appeared. The wound is located on the Medial Sacrum. The wound measures 1.2cm length x 1.4cm width x 0.3cm depth; 1.319cm^2 area and 0.396cm^3 volume. There is Fat Layer (Subcutaneous Tissue) Exposed exposed. There is no tunneling or undermining noted. There is a large amount of serous drainage noted. The wound margin is flat and intact. There is medium (34-66%) pale granulation within the wound bed. There is no necrotic tissue within the wound bed. 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 There was a Excisional Skin/Subcutaneous  Tissue Debridement with a total area of 9.14 sq cm performed by STONE III, HOYT E., PA-C. With the following instrument(s): Forceps, and Scissors to remove Viable and Non-Viable tissue/material. Material removed includes Subcutaneous Tissue and Slough and. No specimens were taken. A time out was conducted at 14:00, prior to the start of the procedure. A Minimum amount of bleeding was controlled with Pressure. The procedure was tolerated well with a pain level of 0 throughout and a pain level of 0 following the procedure. Post Debridement Measurements: 4.5cm length x 2cm width x 0.1cm depth; 0.707cm^3 volume. Character of Wound/Ulcer Post Debridement is stable. Severity of Tissue Post Debridement is: Fat layer exposed. Post procedure Diagnosis Wound #3: Same as Pre-Procedure OZIL, STETTLER ([id number]) Wound #4 Pre-procedure diagnosis of Wound #4 is a Diabetic Wound/Ulcer of the Lower Extremity located on the Right Toe Second .Severity of Tissue Pre Debridement is: Fat layer exposed. There was a Excisional Skin/Subcutaneous Tissue Debridement with a total area of 0.3 sq cm performed by STONE III, HOYT E., PA-C. With the following instrument(s): Forceps, and Scissors to remove Viable and Non-Viable tissue/material. Material removed includes Subcutaneous Tissue and Slough and. No specimens were taken. A time out was conducted at 14:00, prior to the start of the procedure. A Minimum amount of bleeding was controlled with Pressure. The procedure was tolerated well with a pain level of 0 throughout and a pain level of 0 following the procedure. Post Debridement Measurements: 0.5cm length x 0.6cm width x 0.1cm depth; 0.024cm^3 volume. Character of Wound/Ulcer Post Debridement is stable. Severity of Tissue Post Debridement is: Fat layer exposed. Post procedure Diagnosis Wound #4: Same as Pre-Procedure Wound #5 Pre-procedure diagnosis of Wound #5 is a Pressure Ulcer located on the Left Gluteus .  There was a Excisional Skin/Subcutaneous Tissue Debridement with a total area of 0.42 sq cm performed by STONE III, HOYT E., PA-C. With the following instrument(s): Forceps, and Scissors to remove Viable and Non-Viable tissue/material. Material removed includes Subcutaneous Tissue, Slough, and Skin: Dermis. No specimens were taken. A  time out was conducted at 14:00, prior to the start of the procedure. A Minimum amount of bleeding was controlled with Pressure. The procedure was tolerated well with a pain level of 0 throughout and a pain level of 0 following the procedure. Post Debridement Measurements: 0.7cm length x 0.6cm width x 0.2cm depth; 0.066cm^3 volume. Post debridement Stage noted as Category/Stage II. Character of Wound/Ulcer Post Debridement is stable. Post procedure Diagnosis Wound #5: Same as Pre-Procedure Plan Wound Cleansing: Wound #1 Left Calcaneus: Clean wound with Normal Saline. Cleanse wound with mild soap and water Wound #2 Right Calcaneus: Clean wound with Normal Saline. Cleanse wound with mild soap and water Wound #3 Right Toe Great: Clean wound with Normal Saline. Cleanse wound with mild soap and water Wound #4 Right Toe Second: Clean wound with Normal Saline. Cleanse wound with mild soap and water Wound #5 Left Gluteus: Clean wound with Normal Saline. Cleanse wound with mild soap and water Wound #6 Medial Sacrum: Clean wound with Normal Saline. Cleanse wound with mild soap and water Primary Wound Dressing: Wound #1 Left Calcaneus: Silver Alginate Wound #2 Right Calcaneus: Silver Alginate Wound #5 Left Gluteus: Silver Alginate RECO, SHONK R. ([id number]) Wound #6 Medial Sacrum: Silver Alginate Wound #3 Right Toe Great: Santyl Ointment Wound #4 Right Toe Second: Santyl Ointment Secondary Dressing: Wound #1 Left Calcaneus: Gauze and Kerlix/Conform Wound #2 Right Calcaneus: Gauze and Kerlix/Conform Wound #3 Right Toe Great: Gauze and  Kerlix/Conform Wound #4 Right Toe Second: Gauze and Kerlix/Conform Wound #5 Left Gluteus: Boardered Foam Dressing Wound #6 Medial Sacrum: Boardered Foam Dressing Dressing Change Frequency: Wound #1 Left Calcaneus: Change Dressing Monday, Wednesday, Friday Wound #2 Right Calcaneus: Change Dressing Monday, Wednesday, Friday Wound #5 Left Gluteus: Change Dressing Monday, Wednesday, Friday Wound #6 Medial Sacrum: Change Dressing Monday, Wednesday, Friday Wound #3 Right Toe Great: Change dressing every day. Wound #4 Right Toe Second: Change dressing every day. Follow-up Appointments: Wound #1 Left Calcaneus: Return Appointment in 2 weeks. Wound #2 Right Calcaneus: Return Appointment in 2 weeks. Wound #3 Right Toe Great: Return Appointment in 2 weeks. Wound #4 Right Toe Second: Return Appointment in 2 weeks. Wound #5 Left Gluteus: Return Appointment in 2 weeks. Wound #6 Medial Sacrum: Return Appointment in 2 weeks. Additional Orders / Instructions: Wound #1 Left Calcaneus: Increase protein intake. Wound #2 Right Calcaneus: Increase protein intake. Wound #3 Right Toe Great: Increase protein intake. Wound #4 Right Toe Second: Increase protein intake. Wound #5 Left Gluteus: Increase protein intake. Wound #6 Medial Sacrum: Increase protein intake. CRU, KRITIKOS ([id number]) Currently my recommendation is going to be that we initiate the above wound care measures for the next week. The patient is in agreement with that plan. We will subsequently see were things stand at follow-up. If anything changes or worsens in the interim he will contact the office and let me know. Please see above for specific wound care orders. We will see patient for re-evaluation in 2 week(s) here in the clinic. If anything worsens or changes patient will contact our office for additional recommendations. Electronic Signature(s) Signed: 02/19/2018 1:21:52 AM By: Worthy Keeler PA-C Entered By:  Worthy Keeler on 02/19/2018 00:36:31 COALTON, ARCH ([id number]) -------------------------------------------------------------------------------- ROS/PFSH Details Patient Name: HARSHAL, SIRMON. Date of Service: 02/18/2018 1:00 PM Medical Record Number: [id number] Patient Account Number: [id number] Date of Birth/Sex: [birth year] (77 y.o. Male) Treating RN: Cornell Barman Primary Care Provider: Fulton Reek Other Clinician: Referring Provider: Frazier Richards Treating Provider/Extender: STONE III, HOYT Weeks in  Treatment: 0 Information Obtained From Patient Chart Wound History Do you currently have one or more open woundso Yes How many open wounds do you currently haveo 6 Approximately how long have you had your woundso 1year How have you been treating your wound(s) until nowo betadine Has your wound(s) ever healed and then re-openedo No Have you had any lab work done in the past montho No Have you tested positive for an antibiotic resistant organism (MRSA, VRE)o No Have you tested positive for osteomyelitis (bone infection)o No Have you had any tests for circulation on your legso Yes Who ordered the [facility] Gastrointestinal Complaints and Symptoms: Positive for: Frequent diarrhea Negative for: Nausea; Vomiting Medical History: Negative for: Cirrhosis ; Colitis; Crohnos; Hepatitis A; Hepatitis B; Hepatitis C Genitourinary Complaints and Symptoms: Positive for: Kidney failure/ Dialysis Medical History: Positive for: End Stage Renal Disease Constitutional Symptoms (General Health) Complaints and Symptoms: No Complaints or Symptoms Eyes Complaints and Symptoms: No Complaints or Symptoms Medical History: Negative for: Cataracts Ear/Nose/Mouth/Throat Complaints and Symptoms: No Complaints or Symptoms Medical HistoryREMIJIO, HOLLERAN ([id number]) Negative for: Chronic sinus problems/congestion Hematologic/Lymphatic Complaints and Symptoms: No Complaints  or Symptoms Medical History: Positive for: Anemia Negative for: Hemophilia; Human Immunodeficiency Virus; Lymphedema; Sickle Cell Disease Respiratory Complaints and Symptoms: No Complaints or Symptoms Cardiovascular Complaints and Symptoms: No Complaints or Symptoms Medical History: Positive for: Arrhythmia; Hypertension Negative for: Angina; Congestive Heart Failure; Coronary Artery Disease; Deep Vein Thrombosis; Hypotension; Myocardial Infarction; Peripheral Arterial Disease; Peripheral Venous Disease; Phlebitis; Vasculitis Endocrine Complaints and Symptoms: No Complaints or Symptoms Medical History: Positive for: Type II Diabetes Negative for: Type I Diabetes Treated with: Oral agents Immunological Complaints and Symptoms: No Complaints or Symptoms Medical History: Negative for: Lupus Erythematosus; Raynaudos; Scleroderma Musculoskeletal Complaints and Symptoms: No Complaints or Symptoms Neurologic Complaints and Symptoms: No Complaints or Symptoms Oncologic Complaints and Symptoms: No Complaints or Symptoms LADARRIUS, BOGDANSKI. ([id number]) Complaints and Symptoms: Review of System Notes: Prostate Cancer Medical History: Positive for: Received Radiation Negative for: Received Chemotherapy Immunizations Pneumococcal Vaccine: Received Pneumococcal Vaccination: Yes Implantable Devices Family and Social History Cancer: No; Diabetes: No; Heart Disease: Yes - Mother; Hypertension: Yes; Kidney Disease: No; Lung Disease: No; Seizures: No; Stroke: No; Thyroid Problems: No; Tuberculosis: No; Former smoker; Marital Status - Married; Alcohol Use: Never; Drug Use: No History; Caffeine Use: Daily; Advanced Directives: Yes; Living Will: Yes; Medical Power of Attorney: Yes Electronic Signature(s) Signed: 02/19/2018 1:21:52 AM By: Worthy Keeler PA-C Signed: 02/20/2018 7:26:08 AM By: Gretta Cool, BSN, RN, CWS, Kim RN, BSN Entered By: Gretta Cool, BSN, RN, CWS, Kim on 02/18/2018  13:42:01 LINDSEY, HOMMEL ([id number]) -------------------------------------------------------------------------------- SuperBill Details Patient Name: DAMONTE, FRIESON. Date of Service: 02/18/2018 Medical Record Number: [id number] Patient Account Number: [id number] Date of Birth/Sex: 1940-05-29 (77 y.o. Male) Treating RN: Montey Hora Primary Care Provider: Fulton Reek Other Clinician: Referring Provider: Frazier Richards Treating Provider/Extender: Melburn Hake, HOYT Weeks in Treatment: 0 Diagnosis Coding ICD-10 Codes Code Description [phone] Pressure ulcer of left heel, stage 2 L89.610 Pressure ulcer of right heel, unstageable E11.621 Type 2 diabetes mellitus with foot ulcer L97.512 Non-pressure chronic ulcer of other part of right foot with fat layer exposed L89.323 Pressure ulcer of left buttock, stage 3 L89.153 Pressure ulcer of sacral region, stage 3 I73.89 Other specified peripheral vascular diseases Facility Procedures CPT4 Code: [id number] Description: 90240 - WOUND CARE VISIT-LEV 3 EST PT Modifier: Quantity: 1 CPT4 Code: [id number] Description: 11042 - DEB SUBQ TISSUE 20 SQ CM/< ICD-10 Diagnosis Description L97.512  Non-pressure chronic ulcer of other part of right foot with fat L89.323 Pressure ulcer of left buttock, stage 3 L89.153 Pressure ulcer of sacral region, stage 3 Modifier: layer exposed Quantity: 1 Physician Procedures CPT4 Code: 2481859 Description: 09311 - WC PHYS LEVEL 4 - NEW PT ICD-10 Diagnosis Description L89.622 Pressure ulcer of left heel, stage 2 L89.610 Pressure ulcer of right heel, unstageable E11.621 Type 2 diabetes mellitus with foot ulcer L97.512 Non-pressure chronic ulcer  of other part of right foot with fat Modifier: 25 layer exposed Quantity: 1 CPT4 Code: 2162446 Description: 95072 - WC PHYS SUBQ TISS 20 SQ CM ICD-10 Diagnosis Description L97.512 Non-pressure chronic ulcer of other part of right foot with fat L89.323 Pressure ulcer of  left buttock, stage 3 L89.153 Pressure ulcer of sacral region, stage 3 Modifier: layer exposed Quantity: 1 Electronic Signature(s) Signed: 02/19/2018 1:21:52 AM By: Orpah Melter, [place] RMarland Kitchen ([id number]) Entered By: Worthy Keeler on 02/19/2018 00:37:07

## 2018-02-27 ENCOUNTER — Ambulatory Visit (INDEPENDENT_AMBULATORY_CARE_PROVIDER_SITE_OTHER): Payer: Medicare Other | Admitting: Nurse Practitioner

## 2018-02-27 ENCOUNTER — Encounter: Payer: Self-pay | Admitting: Nurse Practitioner

## 2018-02-27 ENCOUNTER — Encounter (INDEPENDENT_AMBULATORY_CARE_PROVIDER_SITE_OTHER): Payer: Self-pay

## 2018-02-27 VITALS — BP 105/71 | HR 98 | Ht 68.0 in | Wt 166.0 lb

## 2018-02-27 DIAGNOSIS — L97909 Non-pressure chronic ulcer of unspecified part of unspecified lower leg with unspecified severity: Secondary | ICD-10-CM

## 2018-02-27 DIAGNOSIS — N183 Chronic kidney disease, stage 3 unspecified: Secondary | ICD-10-CM

## 2018-02-27 DIAGNOSIS — I480 Paroxysmal atrial fibrillation: Secondary | ICD-10-CM | POA: Diagnosis not present

## 2018-02-27 DIAGNOSIS — I10 Essential (primary) hypertension: Secondary | ICD-10-CM | POA: Diagnosis not present

## 2018-02-27 DIAGNOSIS — I70299 Other atherosclerosis of native arteries of extremities, unspecified extremity: Secondary | ICD-10-CM | POA: Diagnosis not present

## 2018-02-27 DIAGNOSIS — I5042 Chronic combined systolic (congestive) and diastolic (congestive) heart failure: Secondary | ICD-10-CM

## 2018-02-27 DIAGNOSIS — E782 Mixed hyperlipidemia: Secondary | ICD-10-CM

## 2018-02-27 NOTE — Progress Notes (Signed)
Office Visit    Patient Name: Gregory Tyler Date of Encounter: 02/27/2018  Primary Care Provider:  [name], MD Primary Cardiologist:  Ida Rogue, MD  Chief Complaint    Gregory Tyler is a 77 year old male with a past history of PAF, hypertension, hyperlipidemia, combined CHF, diabetes, prostate CA, CKD III, and chronic pelvic pain. He is seen today for follow up of CHF and PAF.   Past Medical History    Past Medical History:  Diagnosis Date  . Anemia   . Anxiety   . Arthritis   . Cardiomyopathy ([place])    a. 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  Marland Kitchen Chronic combined systolic (congestive) and diastolic (congestive) heart failure ([place])    a. 05/2015 TEE: EF 35-40% (in setting of Afib); b. 08/2017 Echo: EF 55-60%, no rwma, Gr2DD; b. 01/2018 Echo: EF 55-60%, mild MR. Nl LA size. Nl RV fxn.  . Chronic Pelvic pain   . Coronary artery disease    a. 05/2015 CTA Chest: 3 vessel coronary Ca2+.  . Depression   . Diabetes mellitus without complication ([place])   . Emphysema lung ([place])   . Gastric ulcer   . Gout   . Hyperlipidemia   . Hypertension   . Nephrolithiasis   . PAF (paroxysmal atrial fibrillation) ([place])    a. 05/2015 s/p DCCV; b. 03/2016 s/p DCCV; c. CHA2DS2VASc = 6 (coumadin).  . Prostate cancer [facility])    Prostate  . Stage III chronic kidney disease ([place])   . Weight loss    50 lb since Apr 24, 2015   Past Surgical History:  Procedure Laterality Date  . APPENDECTOMY    . BACK SURGERY    . CHOLECYSTECTOMY    . DIALYSIS/PERMA CATHETER INSERTION N/A 01/22/2018   Procedure: DIALYSIS/PERMA CATHETER INSERTION;  Surgeon: Katha Cabal, MD;  Location: [place] [facility];  Service: Cardiovascular;  Laterality: N/A;  . ELECTROPHYSIOLOGIC STUDY N/A 06/17/2015   Procedure: CARDIOVERSION;  Surgeon: Wellington Hampshire, MD;  Location: [facility] OR[facility];  Service: Cardiovascular;  Laterality: N/A;  . ELECTROPHYSIOLOGIC STUDY N/A 03/30/2016   Procedure:  CARDIOVERSION;  Surgeon: Minna Merritts, MD;  Location: [facility] OR[facility];  Service: Cardiovascular;  Laterality: N/A;  . GASTRIC BYPASS    . HEMORRHOID SURGERY    . LOWER EXTREMITY ANGIOGRAPHY Right 01/23/2018   Procedure: LOWER EXTREMITY ANGIOGRAPHY;  Surgeon: Algernon Huxley, MD;  Location: [place] [facility];  Service: Cardiovascular;  Laterality: Right;  . LOWER EXTREMITY ANGIOGRAPHY Left 01/30/2018   Procedure: LOWER EXTREMITY ANGIOGRAPHY;  Surgeon: Algernon Huxley, MD;  Location: [place] [facility];  Service: Cardiovascular;  Laterality: Left;  . pilonidal cyst    . TEE WITHOUT CARDIOVERSION N/A 06/17/2015   Procedure: TRANSESOPHAGEAL ECHOCARDIOGRAM (TEE);  Surgeon: Wellington Hampshire, MD;  Location: [facility] OR[facility];  Service: Cardiovascular;  Laterality: N/A;    Allergies  Allergies  Allergen Reactions  . Aspirin     GI Bleeding  . Codeine Other (See Comments)    BLISTERS Other reaction(s): Other (See Comments) BLISTERS   . Codeine Sulfate Other (See Comments)    Other reaction(s): Other (See Comments) BLISTERS BLISTERS    History of Present Illness    Gregory Tyler is a 77 year old male with a past history of PAF, hypertension, hyperlipidemia, combined CHF, diabetes, prostate CA, CKD III, and chronic pelvic pain who resides in [facility] skilled nursing facility. He was prev noted  to have LV dysfxn in the setting of AFib in 05/2015, however subsequent echoes have shown improvement in LV fxn.  He was recently admitted from 9/23 to 01/30/18 for altered mental status resulting from Pseudomonas Urosepsis that resulted in ARF, that required inpatient hemodialysis.  Transient elevation in LFT's was noted and was felt to be 2/2 shock liver in the setting of sepsis/hypotn. He is also followed by wound care and vascular surgery for PAD and chronic foot ulcerations. He was discharged back to [facility].   Since discharge he has been doing fairly well. He has experienced some chest pain, but is  unable to quantify severity, length or associated symptoms. He states "it's not worth worrying about". He is able to tolerate daily physical therapy without chest pain, SOB, DOE or dizziness, but says that overall he doesn't feel like he's progressing. He continues to have BLE edema but states that it is greatly improved since his hospital admission. Most recent echo in 12/2017 showed normalized EF of 55-60% with septal WMA secondary to LBBB. His primary complaints are of frequent soft bowel movements that he attributes to his magnesium supplement, and pain in his groin related to a hernia that makes it difficult for him to sit for long periods of time.  He'd like to come off of Mg supplementation.  He denies pnd, orthopnea, n, v, or syncope.  Home Medications    Prior to Admission medications   Medication Sig Start Date End Date Taking? Authorizing Provider  acetaminophen (TYLENOL) 325 MG tablet Take 2 tablets (650 mg total) by mouth every 6 (six) hours as needed for mild pain (or Fever >/= 101). 09/15/17  Yes Wieting, Richard, MD  allopurinol (ZYLOPRIM) 300 MG tablet Take 300 mg by mouth daily.   Yes [provider]  Elastic Bandages & Supports (MEDICAL COMPRESSION THIGH HIGH) MISC 1 application by Does not apply route daily. 09/11/17  Yes Merlyn Lot, MD  ELIQUIS 2.5 MG TABS tablet Take 2.5 mg by mouth every 12 (twelve) hours. 12/12/17  Yes [provider]  gabapentin (NEURONTIN) 100 MG capsule Take 1 capsule (100 mg total) by mouth at bedtime. 09/15/17  Yes Wieting, Richard, MD  HYDROcodone-acetaminophen (NORCO/VICODIN) 5-325 MG tablet Take 1 tablet by mouth every 6 (six) hours as needed for moderate pain or severe pain. 01/29/18  Yes Pyreddy, Reatha Harps, MD  magnesium oxide (MAG-OX) 400 MG tablet Take 400 mg by mouth daily.   Yes [provider]  rosuvastatin (CRESTOR) 20 MG tablet Take 20 mg by mouth daily.   Yes [provider]  torsemide (DEMADEX) 20 MG tablet  Take 20 mg by mouth daily.   Yes [provider]    Review of Systems    He denies chest pain, palpitations, dyspnea, pnd, orthopnea, n, v, dizziness, syncope, weight gain, or early satiety.  He does have BLE edema but he states it is improved from prior to hospitalization. All other systems reviewed and are otherwise negative except as noted above.  Physical Exam    VS:  BP 105/71 (BP Location: Left Arm, Patient Position: Sitting, Cuff Size: Normal)   Pulse 98   Ht 5\' 8"  (1.727 m)   Wt 166 lb (75.3 kg)   BMI 25.24 kg/m  , BMI Body mass index is 25.24 kg/m. GEN: Well nourished, well developed, in no acute distress. HEENT: normal. Neck: Supple, no JVD, carotid bruits, or masses. Cardiac: RRR, +0/1 systolic murmur throughout, no rubs, or gallops. No clubbing, cyanosis. BLE 1+edema  involving ankles/calves and posterior thighs.  PT 1+ and equal bilaterally.  Respiratory:  Respirations regular and unlabored, clear to auscultation bilaterally. GI: Soft, nontender, nondistended, BS + x 4. MS: no deformity or atrophy. Skin: warm and dry, no rash. Neuro:  Strength and sensation are intact. Psych: Normal affect.  Accessory Clinical Findings    ECG personally reviewed by me today - Sinus rhythm with PAC's 98, LAD, LBBB- no acute changes.  Assessment & Plan    1.  Chronic combined systolic and diastolic heart failure: chronic history of BLE edema, was being treated with Lasix, ACEI and beta-blocker, these were stopped during recent hospitalization for sepsis/hypotension and ARF. He is now treated with torsemide only per nephrology. Mild lower ext edema, which he says is much improved.  Suspect much of this is simply dependent edema as he is now more or less w/c bound. BP stable though soft, off BB and ACEI today, 105/71. Most recent echo showed normalized EF of 55-60%. Will cont to hold  blocker/acei.  2. Paroxysmal Atrial Fibrillation: Currently in sinus rhythm, successful  cardioversion in 03/2016. Remains on 2.5mg  Eliquis per primary care team. Was treated with coumadin but issues with INR regulation and was switched to eliquis 2.5 bid by pcp over this past summer.  He understands that based on age/wt/creat, he qualifies for higher dose.  He says that he had severe nose bleeds in the past when on 5mg  dose and so PCP opted for lower dose.  3. CKD III: Recent ARF related to urosepsis requiring brief period of hemodialysis while hospitalized, with subsequent return of normal renal function. He has a follow up appointment next week with nephrology with plans for Vas Cath removal in the near future. He says that he just had blood work last week w/ nephrology.  4. Hypertension: Currently off all BP meds due to recent urosepsis. BP stable. Will continue current treatment and can add back beta-blocker if bp allows in the future.  5. Hyperlipidemia: Lipid panel wnl in May - LDL 37.  Will continue Crestor.  6.  Diarrhea: He has had freq loose/soft stools.  He says that this has happened before when on Mg supplementation.  He would like to d/c.  I advised that he may stop.  He has f/u with nephrology next week and believes he'll have blood work at that time.  If not, we'll need to arrange for a f/u Mg in the future.  7. DMII: A1c 6.6 in July.  Was on metformin prev but this was d/c'd in the setting of ARF in Sept/Oct.  Currently diet controlled.  Per IM.  8.  Dispo: f/u in 3 mos or sooner if necessary.   Murray Hodgkins, NP 02/27/2018, 4:16 PM

## 2018-02-27 NOTE — Patient Instructions (Signed)
Medication Instructions:  - Your physician has recommended you make the following change in your medication:   1) STOP magnesium oxide  If you need a refill on your cardiac medications before your next appointment, please call your pharmacy.   Lab work: - none ordered  If you have labs (blood work) drawn today and your tests are completely normal, you will receive your results only by: Marland Kitchen MyChart Message (if you have MyChart) OR . A paper copy in the mail If you have any lab test that is abnormal or we need to change your treatment, we will call you to review the results.  Testing/Procedures: - none ordered  Follow-Up: At [facility], you and your health needs are our priority.  As part of our continuing mission to provide you with exceptional heart care, we have created designated Provider Care Teams.  These Care Teams include your primary Cardiologist (physician) and Advanced Practice Providers (APPs -  Physician Assistants and Nurse Practitioners) who all work together to provide you with the care you need, when you need it. . 3 months with Dr. Rockey Situ  Any Other Special Instructions Will Be Listed Below (If Applicable). - N/A

## 2018-03-03 ENCOUNTER — Other Ambulatory Visit (INDEPENDENT_AMBULATORY_CARE_PROVIDER_SITE_OTHER): Payer: Self-pay | Admitting: Nurse Practitioner

## 2018-03-03 MED ORDER — CEFAZOLIN SODIUM-DEXTROSE 1-4 GM/50ML-% IV SOLN: 1.0000 g | Freq: Once | INTRAVENOUS | Status: DC

## 2018-03-04 ENCOUNTER — Ambulatory Visit: Payer: Medicare Other | Admitting: Physician Assistant

## 2018-03-04 ENCOUNTER — Ambulatory Visit: Admission: RE | Admit: 2018-03-04 | Payer: Medicare Other | Source: Ambulatory Visit | Admitting: Vascular Surgery

## 2018-03-04 SURGERY — DIALYSIS/PERMA CATHETER REMOVAL
Anesthesia: LOCAL

## 2018-03-18 ENCOUNTER — Telehealth (INDEPENDENT_AMBULATORY_CARE_PROVIDER_SITE_OTHER): Payer: Self-pay

## 2018-03-18 NOTE — Telephone Encounter (Signed)
Pattie called to see if you have received an order for this patient to have his perm cath removed?  I do see where he was scheduled on 11/12 for the procedure/preadmit.  She would like a call back letting her know if you received the order.  [phone]

## 2018-03-18 NOTE — Telephone Encounter (Signed)
He was scheduled and if he did not go then a new order will need to be faxed.

## 2018-03-18 NOTE — Telephone Encounter (Signed)
Called and left a message for them to resend a new order. Their offices were closed, I guess for the holiday.

## 2018-03-19 ENCOUNTER — Encounter (INDEPENDENT_AMBULATORY_CARE_PROVIDER_SITE_OTHER): Payer: Self-pay

## 2018-03-24 ENCOUNTER — Encounter: Payer: Medicare Other | Attending: Physician Assistant | Admitting: Physician Assistant

## 2018-03-24 DIAGNOSIS — E1122 Type 2 diabetes mellitus with diabetic chronic kidney disease: Secondary | ICD-10-CM | POA: Insufficient documentation

## 2018-03-24 DIAGNOSIS — I120 Hypertensive chronic kidney disease with stage 5 chronic kidney disease or end stage renal disease: Secondary | ICD-10-CM | POA: Diagnosis not present

## 2018-03-24 DIAGNOSIS — L89323 Pressure ulcer of left buttock, stage 3: Secondary | ICD-10-CM | POA: Insufficient documentation

## 2018-03-24 DIAGNOSIS — L97812 Non-pressure chronic ulcer of other part of right lower leg with fat layer exposed: Secondary | ICD-10-CM | POA: Diagnosis not present

## 2018-03-24 DIAGNOSIS — N186 End stage renal disease: Secondary | ICD-10-CM | POA: Diagnosis not present

## 2018-03-24 DIAGNOSIS — L89622 Pressure ulcer of left heel, stage 2: Secondary | ICD-10-CM | POA: Diagnosis not present

## 2018-03-24 DIAGNOSIS — Z7984 Long term (current) use of oral hypoglycemic drugs: Secondary | ICD-10-CM | POA: Diagnosis not present

## 2018-03-24 DIAGNOSIS — E11622 Type 2 diabetes mellitus with other skin ulcer: Secondary | ICD-10-CM | POA: Diagnosis not present

## 2018-03-24 DIAGNOSIS — L89153 Pressure ulcer of sacral region, stage 3: Secondary | ICD-10-CM | POA: Diagnosis not present

## 2018-03-24 DIAGNOSIS — L89319 Pressure ulcer of right buttock, unspecified stage: Secondary | ICD-10-CM | POA: Insufficient documentation

## 2018-03-31 ENCOUNTER — Other Ambulatory Visit (INDEPENDENT_AMBULATORY_CARE_PROVIDER_SITE_OTHER): Payer: Self-pay | Admitting: Vascular Surgery

## 2018-04-01 ENCOUNTER — Ambulatory Visit
Admission: RE | Admit: 2018-04-01 | Discharge: 2018-04-01 | Disposition: A | Discharge status: Home / Self Care | Payer: Medicare Other | Source: Ambulatory Visit | Attending: Vascular Surgery | Admitting: Vascular Surgery

## 2018-04-01 ENCOUNTER — Encounter
Admission: RE | Disposition: A | Discharge status: Home / Self Care | Payer: Self-pay | Source: Ambulatory Visit | Attending: Vascular Surgery

## 2018-04-01 DIAGNOSIS — Z992 Dependence on renal dialysis: Secondary | ICD-10-CM

## 2018-04-01 DIAGNOSIS — E785 Hyperlipidemia, unspecified: Secondary | ICD-10-CM | POA: Insufficient documentation

## 2018-04-01 DIAGNOSIS — I120 Hypertensive chronic kidney disease with stage 5 chronic kidney disease or end stage renal disease: Secondary | ICD-10-CM

## 2018-04-01 DIAGNOSIS — Z886 Allergy status to analgesic agent status: Secondary | ICD-10-CM | POA: Diagnosis not present

## 2018-04-01 DIAGNOSIS — M1990 Unspecified osteoarthritis, unspecified site: Secondary | ICD-10-CM | POA: Diagnosis not present

## 2018-04-01 DIAGNOSIS — I509 Heart failure, unspecified: Secondary | ICD-10-CM

## 2018-04-01 DIAGNOSIS — Z833 Family history of diabetes mellitus: Secondary | ICD-10-CM | POA: Diagnosis not present

## 2018-04-01 DIAGNOSIS — T82868A Thrombosis of vascular prosthetic devices, implants and grafts, initial encounter: Secondary | ICD-10-CM

## 2018-04-01 DIAGNOSIS — I132 Hypertensive heart and chronic kidney disease with heart failure and with stage 5 chronic kidney disease, or end stage renal disease: Secondary | ICD-10-CM | POA: Diagnosis not present

## 2018-04-01 DIAGNOSIS — Z9884 Bariatric surgery status: Secondary | ICD-10-CM | POA: Diagnosis not present

## 2018-04-01 DIAGNOSIS — N186 End stage renal disease: Secondary | ICD-10-CM | POA: Insufficient documentation

## 2018-04-01 DIAGNOSIS — M109 Gout, unspecified: Secondary | ICD-10-CM | POA: Insufficient documentation

## 2018-04-01 DIAGNOSIS — E1122 Type 2 diabetes mellitus with diabetic chronic kidney disease: Secondary | ICD-10-CM | POA: Insufficient documentation

## 2018-04-01 DIAGNOSIS — Z955 Presence of coronary angioplasty implant and graft: Secondary | ICD-10-CM | POA: Insufficient documentation

## 2018-04-01 DIAGNOSIS — I480 Paroxysmal atrial fibrillation: Secondary | ICD-10-CM | POA: Insufficient documentation

## 2018-04-01 DIAGNOSIS — Z885 Allergy status to narcotic agent status: Secondary | ICD-10-CM | POA: Diagnosis not present

## 2018-04-01 DIAGNOSIS — Z452 Encounter for adjustment and management of vascular access device: Secondary | ICD-10-CM | POA: Insufficient documentation

## 2018-04-01 DIAGNOSIS — Z87891 Personal history of nicotine dependence: Secondary | ICD-10-CM | POA: Insufficient documentation

## 2018-04-01 DIAGNOSIS — Z8249 Family history of ischemic heart disease and other diseases of the circulatory system: Secondary | ICD-10-CM | POA: Diagnosis not present

## 2018-04-01 DIAGNOSIS — Z823 Family history of stroke: Secondary | ICD-10-CM | POA: Diagnosis not present

## 2018-04-01 DIAGNOSIS — I5042 Chronic combined systolic (congestive) and diastolic (congestive) heart failure: Secondary | ICD-10-CM | POA: Insufficient documentation

## 2018-04-01 DIAGNOSIS — Z8546 Personal history of malignant neoplasm of prostate: Secondary | ICD-10-CM | POA: Insufficient documentation

## 2018-04-01 DIAGNOSIS — Z9049 Acquired absence of other specified parts of digestive tract: Secondary | ICD-10-CM | POA: Insufficient documentation

## 2018-04-01 DIAGNOSIS — I2510 Atherosclerotic heart disease of native coronary artery without angina pectoris: Secondary | ICD-10-CM | POA: Insufficient documentation

## 2018-04-01 DIAGNOSIS — I429 Cardiomyopathy, unspecified: Secondary | ICD-10-CM | POA: Diagnosis not present

## 2018-04-01 DIAGNOSIS — J439 Emphysema, unspecified: Secondary | ICD-10-CM | POA: Insufficient documentation

## 2018-04-01 HISTORY — PX: DIALYSIS/PERMA CATHETER REMOVAL: CATH118289

## 2018-04-01 LAB — GLUCOSE, CAPILLARY: Glucose-Capillary: 315 mg/dL — ABNORMAL HIGH (ref 70–99)

## 2018-04-01 SURGERY — DIALYSIS/PERMA CATHETER REMOVAL
Anesthesia: LOCAL

## 2018-04-01 MED ORDER — LIDOCAINE-EPINEPHRINE (PF) 1 %-1:200000 IJ SOLN
INTRAMUSCULAR | Status: DC | PRN
  Administered 2018-04-01: 10 mL via INTRADERMAL

## 2018-04-01 MED ORDER — INSULIN ASPART 100 UNIT/ML ~~LOC~~ SOLN
4.0000 [IU] | Freq: Once | SUBCUTANEOUS | Status: AC
  Administered 2018-04-01: 4 [IU] via SUBCUTANEOUS

## 2018-04-01 MED ORDER — INSULIN ASPART 100 UNIT/ML ~~LOC~~ SOLN
SUBCUTANEOUS | Status: AC
  Filled 2018-04-01: qty 1

## 2018-04-01 SURGICAL SUPPLY — 6 items
BLADE SURG SZ11 CARB STEEL (BLADE) ×2 IMPLANT
DERMABOND ADVANCED (GAUZE/BANDAGES/DRESSINGS) ×1
DERMABOND ADVANCED .7 DNX12 (GAUZE/BANDAGES/DRESSINGS) ×1 IMPLANT
FORCEPS HALSTEAD CVD 5IN STRL (INSTRUMENTS) ×2 IMPLANT
SUT MON AB 4-0 PC3 18 (SUTURE) ×2 IMPLANT
TRAY LACERAT/PLASTIC (MISCELLANEOUS) ×2 IMPLANT

## 2018-04-01 NOTE — Op Note (Signed)
  OPERATIVE NOTE   PROCEDURE: 1. Removal of a right IJ tunneled dialysis catheter  PRE-OPERATIVE DIAGNOSIS: Complication of dialysis catheter  POST-OPERATIVE DIAGNOSIS: Same  SURGEON: Hortencia Pilar  ANESTHESIA: Local anesthetic with 1% lidocaine with epinephrine   ESTIMATED BLOOD LOSS: Minimal   FINDING(S): 1. Catheter intact   SPECIMEN(S):  Catheter  INDICATIONS:   Gregory Tyler is a 77 y.o. male who presents with functioning kidneys.  He has returned to his baseline stage III renal insufficiency and no longer is on dialysis.  Therefore he no longer needs the catheter.  Risk and benefits for removal have been reviewed patient agrees to proceed.  DESCRIPTION: After obtaining full informed written consent, the patient was positioned supine. The right IJ catheter and surrounding area is prepped and draped in a sterile fashion. The cuff was localized by palpation and noted to be greater than 3 cm from the exit site. After appropriate timeout is called, 1% lidocaine with epinephrine is infiltrated into the surrounding tissues around the cuff. Small transverse incision is created with an 11 blade scalpel and the dissection was carried down to expose the cuff of the tunneled catheter.  The catheter is then freed from the surrounding attachments and adhesions. Once the catheter has been freed circumferentially it is transected just distal to the cuff and subsequently removed in 2 pieces. Light pressure was held at the base of the neck. A 4-0 Monocryl was used close the tunnel in the subcutaneous space. The 4-0 Monocryl Monocryl was then used to close the skin in a subcuticular stitch. Dermabond is applied.  Antibiotic ointment and a sterile dressing is applied to the exit site. Patient tolerated procedure well and there were no complications.  COMPLICATIONS: None  CONDITION: Unchanged  Hortencia Pilar. [facility] Office: [phone]  04/01/2018,3:21 PM

## 2018-04-01 NOTE — Discharge Instructions (Signed)
Tunneled Catheter Removal, Care After °Refer to this sheet in the next few weeks. These instructions provide you with information about caring for yourself after your procedure. Your health care provider may also give you more specific instructions. Your treatment has been planned according to current medical practices, but problems sometimes occur. Call your health care provider if you have any problems or questions after your procedure. °What can I expect after the procedure? °After the procedure, it is common to have: °· Some mild redness, swelling, and pain around your catheter site. ° ° °Follow these instructions at home: °Incision care  °· Check your removal site  every day for signs of infection. Check for: °¨ More redness, swelling, or pain. °¨ More fluid or blood. °¨ Warmth. °¨ Pus or a bad smell. °· Follow instructions from your health care provider about how to take care of your removal site. Make sure you: °¨ Wash your hands with soap and water before you change your bandages (dressings). If soap and water are not available, use hand sanitizer. °Activity  °· Return to your normal activities as told by your health care provider. Ask your health care provider what activities are safe for you. °· Do not lift anything that is heavier than 10 lb (4.5 kg) for 3 weeks or as long as told by your health care provider. ° °Contact a health care provider if: °· You have more fluid or blood coming from your removal site °· You have more redness, swelling, or pain at your incisions or around the area where your catheter was removed °· Your removal site feel warm to the touch. °· You feel unusually weak. °· You feel nauseous.. °· Get help right away if °· You have swelling in your arm, shoulder, neck, or face. °· You develop chest pain. °· You have difficulty breathing. °· You feel dizzy or light-headed. °· You have pus or a bad smell coming from your removal site °· You have a fever. °· You develop bleeding from your  removal site, and your bleeding does not stop. °This information is not intended to replace advice given to you by your health care provider. Make sure you discuss any questions you have with your health care provider. °Document Released: 03/26/2012 Document Revised: 12/11/2015 Document Reviewed: 01/03/2015 °Elsevier Interactive Patient Education © 2017 [employer] ° °

## 2018-04-01 NOTE — H&P (Signed)
[facility] Admission History & Physical  MRN : [id number]  Gregory Tyler is a 77 y.o. (02-11-1941) male who presents with chief complaint of here to have my catheter taken out.  History of Present Illness: I am asked to evaluate the patient by the dialysis center. The patient was sent here because they have a nonfunctioning tunneled catheter and they have returned to their baseline chronic renal disease stage III.  The patient reports they are not planning to do any dialysis in the immediate future.  Patient denies pain or tenderness overlying the catheter access.  There was no pain with dialysis.  No fevers or chills while on dialysis.   No current facility-administered medications for this encounter.    Facility-Administered Medications Ordered in Other Encounters  Medication Dose Route Frequency Provider Last Rate Last Dose  . ceFAZolin (ANCEF) IVPB 1 g/50 mL premix  1 g Intravenous Once Kris Hartmann, NP        Past Medical History:  Diagnosis Date  . Anemia   . Anxiety   . Arthritis   . Cardiomyopathy ([place])    a. 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  Marland Kitchen Chronic combined systolic (congestive) and diastolic (congestive) heart failure ([place])    a. 05/2015 TEE: EF 35-40% (in setting of Afib); b. 08/2017 Echo: EF 55-60%, no rwma, Gr2DD; b. 01/2018 Echo: EF 55-60%, mild MR. Nl LA size. Nl RV fxn.  . Chronic Pelvic pain   . Coronary artery disease    a. 05/2015 CTA Chest: 3 vessel coronary Ca2+.  . Depression   . Diabetes mellitus without complication ([place])   . Emphysema lung ([place])   . Gastric ulcer   . Gout   . Hyperlipidemia   . Hypertension   . Nephrolithiasis   . PAF (paroxysmal atrial fibrillation) ([place])    a. 05/2015 s/p DCCV; b. 03/2016 s/p DCCV; c. CHA2DS2VASc = 6 (coumadin).  . Prostate cancer [facility])    Prostate  . Stage III chronic kidney disease ([place])   . Weight loss    50 lb since Apr 24, 2015    Past Surgical  History:  Procedure Laterality Date  . APPENDECTOMY    . BACK SURGERY    . CHOLECYSTECTOMY    . DIALYSIS/PERMA CATHETER INSERTION N/A 01/22/2018   Procedure: DIALYSIS/PERMA CATHETER INSERTION;  Surgeon: Katha Cabal, MD;  Location: [facility];  Service: Cardiovascular;  Laterality: N/A;  . ELECTROPHYSIOLOGIC STUDY N/A 06/17/2015   Procedure: CARDIOVERSION;  Surgeon: Wellington Hampshire, MD;  Location: [facility];  Service: Cardiovascular;  Laterality: N/A;  . ELECTROPHYSIOLOGIC STUDY N/A 03/30/2016   Procedure: CARDIOVERSION;  Surgeon: Minna Merritts, MD;  Location: [facility];  Service: Cardiovascular;  Laterality: N/A;  . GASTRIC BYPASS    . HEMORRHOID SURGERY    . LOWER EXTREMITY ANGIOGRAPHY Right 01/23/2018   Procedure: LOWER EXTREMITY ANGIOGRAPHY;  Surgeon: Algernon Huxley, MD;  Location: [facility];  Service: Cardiovascular;  Laterality: Right;  . LOWER EXTREMITY ANGIOGRAPHY Left 01/30/2018   Procedure: LOWER EXTREMITY ANGIOGRAPHY;  Surgeon: Algernon Huxley, MD;  Location: [facility];  Service: Cardiovascular;  Laterality: Left;  . pilonidal cyst    . TEE WITHOUT CARDIOVERSION N/A 06/17/2015   Procedure: TRANSESOPHAGEAL ECHOCARDIOGRAM (TEE);  Surgeon: Wellington Hampshire, MD;  Location: [facility];  Service: Cardiovascular;  Laterality: N/A;    Social History Social History   Tobacco Use  .  Smoking status: Former Smoker    Last attempt to quit: 12/19/1940    Years since quitting: 77.3  . Smokeless tobacco: Former Systems developer    Types: Chew    Quit date: 11/19/2014  Substance Use Topics  . Alcohol use: No  . Drug use: No    Family History Family History  Problem Relation Age of Onset  . CAD Mother   . Stroke Mother   . COPD Mother   . Heart failure Mother   . Diabetes Other     No family history of bleeding or clotting disorders, autoimmune disease or porphyria  Allergies  Allergen Reactions  . Aspirin     GI Bleeding  . Codeine Other (See Comments)     BLISTERS Other reaction(s): Other (See Comments) BLISTERS   . Codeine Sulfate Other (See Comments)    Other reaction(s): Other (See Comments) BLISTERS BLISTERS     REVIEW OF SYSTEMS (Negative unless checked)  Constitutional: [] Weight loss  [] Fever  [] Chills Cardiac: [] Chest pain   [] Chest pressure   [] Palpitations   [] Shortness of breath when laying flat   [] Shortness of breath at rest   [x] Shortness of breath with exertion. Vascular:  [] Pain in legs with walking   [] Pain in legs at rest   [] Pain in legs when laying flat   [] Claudication   [] Pain in feet when walking  [] Pain in feet at rest  [] Pain in feet when laying flat   [] History of DVT   [] Phlebitis   [] Swelling in legs   [] Varicose veins   [] Non-healing ulcers Pulmonary:   [] Uses home oxygen   [] Productive cough   [] Hemoptysis   [] Wheeze  [] COPD   [] Asthma Neurologic:  [] Dizziness  [] Blackouts   [] Seizures   [] History of stroke   [] History of TIA  [] Aphasia   [] Temporary blindness   [] Dysphagia   [] Weakness or numbness in arms   [] Weakness or numbness in legs Musculoskeletal:  [] Arthritis   [] Joint swelling   [] Joint pain   [] Low back pain Hematologic:  [] Easy bruising  [] Easy bleeding   [] Hypercoagulable state   [] Anemic  [] Hepatitis Gastrointestinal:  [] Blood in stool   [] Vomiting blood  [] Gastroesophageal reflux/heartburn   [] Difficulty swallowing. Genitourinary:  [x] Chronic kidney disease   [] Difficult urination  [] Frequent urination  [] Burning with urination   [] Blood in urine Skin:  [] Rashes   [] Ulcers   [] Wounds Psychological:  [] History of anxiety   []  History of major depression.  Physical Examination  Vitals:   04/01/18 1400  BP: 114/89  Pulse: 96  Resp: 20  SpO2: 98%   There is no height or weight on file to calculate BMI. Gen: WD/WN, NAD Head: [place]/AT, No temporalis wasting. Prominent temp pulse not noted. Ear/Nose/Throat: Hearing grossly intact, nares w/o erythema or drainage, oropharynx w/o Erythema/Exudate,   Eyes: Conjunctiva clear, sclera non-icteric Neck: Trachea midline.  No JVD.  Pulmonary:  Good air movement, respirations not labored, no use of accessory muscles.  Cardiac: RRR, normal S1, S2. Vascular: Right IJ tunneled catheter clean dry and intact Vessel Right Left  Radial Palpable Palpable  Ulnar Not Palpable Not Palpable  Brachial Palpable Palpable  Carotid Palpable, without bruit Palpable, without bruit  Gastrointestinal: soft, non-tender/non-distended. No guarding/reflex.  Musculoskeletal: M/S 5/5 throughout.  Extremities without ischemic changes.  No deformity or atrophy.  Neurologic: Sensation grossly intact in extremities.  Symmetrical.  Speech is fluent. Motor exam as listed above. Psychiatric: Judgment intact, Mood & affect appropriate for pt's clinical situation. Dermatologic: No rashes  or ulcers noted.  No cellulitis or open wounds. Lymph : No Cervical, Axillary, or Inguinal lymphadenopathy.   CBC Lab Results  Component Value Date   WBC 5.2 01/28/2018   HGB 8.5 (L) 01/29/2018   HCT 27.3 (L) 01/29/2018   MCV 96.8 01/28/2018   PLT 193 01/28/2018    BMET    Component Value Date/Time   NA 143 01/30/2018 0648   NA 139 03/27/2016 1634   K 3.6 01/30/2018 0648   CL 110 01/30/2018 0648   CO2 28 01/30/2018 0648   GLUCOSE 101 (H) 01/30/2018 0648   BUN 22 01/30/2018 0648   BUN 29 (H) 03/27/2016 1634   CREATININE 1.45 (H) 01/30/2018 0648   CALCIUM 7.6 (L) 01/30/2018 0648   GFRNONAA 45 (L) 01/30/2018 0648   GFRAA 52 (L) 01/30/2018 0648   CrCl cannot be calculated (Patient's most recent lab result is older than the maximum 21 days allowed.).  COAG Lab Results  Component Value Date   INR 1.46 01/18/2018   INR 1.75 11/19/2017   INR 1.77 11/18/2017    Radiology No results found.  Assessment/Plan 1.  Complication dialysis device with thrombosis AV access:  Patient's right IJ tunneled catheter is no longer needed.  The risks and benefits of been reviewed all  questions answered patient agrees to removal of his tunneled dialysis catheter under local.   2.  End-stage renal disease requiring hemodialysis:  Patient will continue dialysis therapy without further interruption if a successful intervention is not achieved then a tunneled catheter will be placed. Dialysis has already been arranged. 3.  Hypertension:  Patient will continue medical management; nephrology is following no changes in oral medications. 4. Diabetes mellitus:  Glucose will be monitored and oral medications been held this morning once the patient has undergone the patient's procedure po intake will be reinitiated and again Accu-Cheks will be used to assess the blood glucose level and treat as needed. The patient will be restarted on the patient's usual hypoglycemic regime 5.  Congestive heart failure:  EKG will be monitored. Nitrates will be used if needed. The patient's oral cardiac medications will be continued.    Hortencia Pilar, MD  04/01/2018 2:22 PM

## 2018-04-02 ENCOUNTER — Encounter: Payer: Self-pay | Admitting: Vascular Surgery

## 2018-04-07 ENCOUNTER — Other Ambulatory Visit: Payer: Self-pay

## 2018-04-07 ENCOUNTER — Telehealth (INDEPENDENT_AMBULATORY_CARE_PROVIDER_SITE_OTHER): Payer: Self-pay

## 2018-04-07 ENCOUNTER — Ambulatory Visit (INDEPENDENT_AMBULATORY_CARE_PROVIDER_SITE_OTHER): Payer: Medicare Other | Admitting: Vascular Surgery

## 2018-04-07 ENCOUNTER — Encounter: Payer: Self-pay | Admitting: Emergency Medicine

## 2018-04-07 ENCOUNTER — Inpatient Hospital Stay
Admission: EM | Admit: 2018-04-07 | Discharge: 2018-04-10 | DRG: 271 | Disposition: A | Discharge status: Skilled Nursing Facility | Payer: Medicare Other | Attending: Internal Medicine | Admitting: Internal Medicine

## 2018-04-07 ENCOUNTER — Encounter: Payer: Medicare Other | Admitting: Physician Assistant

## 2018-04-07 ENCOUNTER — Encounter (INDEPENDENT_AMBULATORY_CARE_PROVIDER_SITE_OTHER): Payer: Self-pay | Admitting: Vascular Surgery

## 2018-04-07 ENCOUNTER — Emergency Department: Payer: Medicare Other

## 2018-04-07 VITALS — BP 94/58 | HR 58 | Resp 18

## 2018-04-07 DIAGNOSIS — Z87442 Personal history of urinary calculi: Secondary | ICD-10-CM

## 2018-04-07 DIAGNOSIS — I70232 Atherosclerosis of native arteries of right leg with ulceration of calf: Secondary | ICD-10-CM

## 2018-04-07 DIAGNOSIS — E1122 Type 2 diabetes mellitus with diabetic chronic kidney disease: Secondary | ICD-10-CM | POA: Diagnosis present

## 2018-04-07 DIAGNOSIS — Z87891 Personal history of nicotine dependence: Secondary | ICD-10-CM

## 2018-04-07 DIAGNOSIS — I4891 Unspecified atrial fibrillation: Secondary | ICD-10-CM | POA: Diagnosis present

## 2018-04-07 DIAGNOSIS — Z825 Family history of asthma and other chronic lower respiratory diseases: Secondary | ICD-10-CM

## 2018-04-07 DIAGNOSIS — I480 Paroxysmal atrial fibrillation: Secondary | ICD-10-CM

## 2018-04-07 DIAGNOSIS — L97929 Non-pressure chronic ulcer of unspecified part of left lower leg with unspecified severity: Secondary | ICD-10-CM | POA: Diagnosis present

## 2018-04-07 DIAGNOSIS — G8929 Other chronic pain: Secondary | ICD-10-CM | POA: Diagnosis present

## 2018-04-07 DIAGNOSIS — Z9049 Acquired absence of other specified parts of digestive tract: Secondary | ICD-10-CM

## 2018-04-07 DIAGNOSIS — Z7401 Bed confinement status: Secondary | ICD-10-CM

## 2018-04-07 DIAGNOSIS — E876 Hypokalemia: Secondary | ICD-10-CM | POA: Diagnosis present

## 2018-04-07 DIAGNOSIS — I420 Dilated cardiomyopathy: Secondary | ICD-10-CM | POA: Diagnosis present

## 2018-04-07 DIAGNOSIS — I70242 Atherosclerosis of native arteries of left leg with ulceration of calf: Secondary | ICD-10-CM

## 2018-04-07 DIAGNOSIS — Z833 Family history of diabetes mellitus: Secondary | ICD-10-CM

## 2018-04-07 DIAGNOSIS — I2510 Atherosclerotic heart disease of native coronary artery without angina pectoris: Secondary | ICD-10-CM

## 2018-04-07 DIAGNOSIS — I130 Hypertensive heart and chronic kidney disease with heart failure and stage 1 through stage 4 chronic kidney disease, or unspecified chronic kidney disease: Secondary | ICD-10-CM | POA: Diagnosis present

## 2018-04-07 DIAGNOSIS — Z66 Do not resuscitate: Secondary | ICD-10-CM | POA: Diagnosis present

## 2018-04-07 DIAGNOSIS — Z8546 Personal history of malignant neoplasm of prostate: Secondary | ICD-10-CM

## 2018-04-07 DIAGNOSIS — N183 Chronic kidney disease, stage 3 (moderate): Secondary | ICD-10-CM | POA: Diagnosis present

## 2018-04-07 DIAGNOSIS — Z823 Family history of stroke: Secondary | ICD-10-CM

## 2018-04-07 DIAGNOSIS — R0602 Shortness of breath: Secondary | ICD-10-CM | POA: Diagnosis not present

## 2018-04-07 DIAGNOSIS — E1165 Type 2 diabetes mellitus with hyperglycemia: Secondary | ICD-10-CM | POA: Diagnosis present

## 2018-04-07 DIAGNOSIS — M1990 Unspecified osteoarthritis, unspecified site: Secondary | ICD-10-CM | POA: Diagnosis present

## 2018-04-07 DIAGNOSIS — I10 Essential (primary) hypertension: Secondary | ICD-10-CM

## 2018-04-07 DIAGNOSIS — E871 Hypo-osmolality and hyponatremia: Secondary | ICD-10-CM | POA: Diagnosis present

## 2018-04-07 DIAGNOSIS — L97229 Non-pressure chronic ulcer of left calf with unspecified severity: Secondary | ICD-10-CM

## 2018-04-07 DIAGNOSIS — E1142 Type 2 diabetes mellitus with diabetic polyneuropathy: Secondary | ICD-10-CM

## 2018-04-07 DIAGNOSIS — L89619 Pressure ulcer of right heel, unspecified stage: Secondary | ICD-10-CM | POA: Diagnosis present

## 2018-04-07 DIAGNOSIS — I5042 Chronic combined systolic (congestive) and diastolic (congestive) heart failure: Secondary | ICD-10-CM | POA: Diagnosis present

## 2018-04-07 DIAGNOSIS — J439 Emphysema, unspecified: Secondary | ICD-10-CM | POA: Diagnosis present

## 2018-04-07 DIAGNOSIS — L97219 Non-pressure chronic ulcer of right calf with unspecified severity: Secondary | ICD-10-CM | POA: Diagnosis not present

## 2018-04-07 DIAGNOSIS — R102 Pelvic and perineal pain: Secondary | ICD-10-CM | POA: Diagnosis present

## 2018-04-07 DIAGNOSIS — Z8249 Family history of ischemic heart disease and other diseases of the circulatory system: Secondary | ICD-10-CM

## 2018-04-07 DIAGNOSIS — I447 Left bundle-branch block, unspecified: Secondary | ICD-10-CM | POA: Diagnosis present

## 2018-04-07 DIAGNOSIS — R0600 Dyspnea, unspecified: Secondary | ICD-10-CM

## 2018-04-07 DIAGNOSIS — D631 Anemia in chronic kidney disease: Secondary | ICD-10-CM | POA: Diagnosis present

## 2018-04-07 DIAGNOSIS — L97919 Non-pressure chronic ulcer of unspecified part of right lower leg with unspecified severity: Secondary | ICD-10-CM | POA: Diagnosis present

## 2018-04-07 DIAGNOSIS — Z885 Allergy status to narcotic agent status: Secondary | ICD-10-CM

## 2018-04-07 DIAGNOSIS — Z886 Allergy status to analgesic agent status: Secondary | ICD-10-CM

## 2018-04-07 DIAGNOSIS — E1151 Type 2 diabetes mellitus with diabetic peripheral angiopathy without gangrene: Secondary | ICD-10-CM | POA: Diagnosis present

## 2018-04-07 DIAGNOSIS — Z7901 Long term (current) use of anticoagulants: Secondary | ICD-10-CM

## 2018-04-07 DIAGNOSIS — Z8711 Personal history of peptic ulcer disease: Secondary | ICD-10-CM

## 2018-04-07 DIAGNOSIS — E785 Hyperlipidemia, unspecified: Secondary | ICD-10-CM | POA: Diagnosis present

## 2018-04-07 DIAGNOSIS — M109 Gout, unspecified: Secondary | ICD-10-CM | POA: Diagnosis present

## 2018-04-07 DIAGNOSIS — L89629 Pressure ulcer of left heel, unspecified stage: Secondary | ICD-10-CM | POA: Diagnosis present

## 2018-04-07 DIAGNOSIS — I70238 Atherosclerosis of native arteries of right leg with ulceration of other part of lower right leg: Secondary | ICD-10-CM | POA: Diagnosis present

## 2018-04-07 DIAGNOSIS — Z888 Allergy status to other drugs, medicaments and biological substances status: Secondary | ICD-10-CM

## 2018-04-07 LAB — URINALYSIS, COMPLETE (UACMP) WITH MICROSCOPIC
Bilirubin Urine: NEGATIVE
Glucose, UA: NEGATIVE mg/dL
Ketones, ur: NEGATIVE mg/dL
Nitrite: NEGATIVE
Protein, ur: NEGATIVE mg/dL
Specific Gravity, Urine: 1.008 (ref 1.005–1.030)
pH: 5 (ref 5.0–8.0)

## 2018-04-07 LAB — CBC WITH DIFFERENTIAL/PLATELET
Abs Immature Granulocytes: 0.07 10*3/uL (ref 0.00–0.07)
Basophils Absolute: 0 10*3/uL (ref 0.0–0.1)
Basophils Relative: 0 %
Eosinophils Absolute: 0 10*3/uL (ref 0.0–0.5)
Eosinophils Relative: 0 %
HCT: 34.1 % — ABNORMAL LOW (ref 39.0–52.0)
Hemoglobin: 10.5 g/dL — ABNORMAL LOW (ref 13.0–17.0)
Immature Granulocytes: 1 %
Lymphocytes Relative: 15 %
Lymphs Abs: 1.5 10*3/uL (ref 0.7–4.0)
MCH: 29.2 pg (ref 26.0–34.0)
MCHC: 30.8 g/dL (ref 30.0–36.0)
MCV: 94.7 fL (ref 80.0–100.0)
Monocytes Absolute: 0.7 10*3/uL (ref 0.1–1.0)
Monocytes Relative: 7 %
NEUTROS PCT: 77 %
Neutro Abs: 7.7 10*3/uL (ref 1.7–7.7)
Platelets: 289 10*3/uL (ref 150–400)
RBC: 3.6 MIL/uL — ABNORMAL LOW (ref 4.22–5.81)
RDW: 15.5 % (ref 11.5–15.5)
WBC: 10 10*3/uL (ref 4.0–10.5)
nRBC: 0 % (ref 0.0–0.2)

## 2018-04-07 LAB — COMPREHENSIVE METABOLIC PANEL
ALBUMIN: 2.6 g/dL — AB (ref 3.5–5.0)
ALT: 54 U/L — ABNORMAL HIGH (ref 0–44)
AST: 45 U/L — ABNORMAL HIGH (ref 15–41)
Alkaline Phosphatase: 104 U/L (ref 38–126)
Anion gap: 8 (ref 5–15)
BUN: 39 mg/dL — ABNORMAL HIGH (ref 8–23)
CO2: 21 mmol/L — ABNORMAL LOW (ref 22–32)
CREATININE: 1.24 mg/dL (ref 0.61–1.24)
Calcium: 8.3 mg/dL — ABNORMAL LOW (ref 8.9–10.3)
Chloride: 105 mmol/L (ref 98–111)
GFR calc Af Amer: >60 mL/min (ref >60)
GFR calc non Af Amer: 55 mL/min — ABNORMAL LOW (ref >60)
GLUCOSE: 297 mg/dL — AB (ref 70–99)
Potassium: 3.8 mmol/L (ref 3.5–5.1)
Sodium: 134 mmol/L — ABNORMAL LOW (ref 135–145)
Total Bilirubin: 0.5 mg/dL (ref 0.3–1.2)
Total Protein: 6.3 g/dL — ABNORMAL LOW (ref 6.5–8.1)

## 2018-04-07 LAB — BRAIN NATRIURETIC PEPTIDE: B Natriuretic Peptide: 537 pg/mL — ABNORMAL HIGH (ref 0.0–100.0)

## 2018-04-07 LAB — MRSA PCR SCREENING: MRSA by PCR: NEGATIVE

## 2018-04-07 LAB — PROTIME-INR
INR: 1.3
Prothrombin Time: 16.1 seconds — ABNORMAL HIGH (ref 11.4–15.2)

## 2018-04-07 LAB — LACTIC ACID, PLASMA: Lactic Acid, Venous: 1.7 mmol/L (ref 0.5–1.9)

## 2018-04-07 LAB — TROPONIN I: Troponin I: 0.05 ng/mL (ref <0.03)

## 2018-04-07 MED ORDER — ACETAMINOPHEN 325 MG PO TABS
650.0000 mg | ORAL_TABLET | Freq: Four times a day (QID) | ORAL | Status: DC | PRN
  Filled 2018-04-07: qty 2

## 2018-04-07 MED ORDER — DILTIAZEM HCL 30 MG PO TABS
30.0000 mg | ORAL_TABLET | Freq: Four times a day (QID) | ORAL | Status: DC
  Administered 2018-04-07 – 2018-04-10 (×11): 30 mg via ORAL
  Filled 2018-04-07 (×11): qty 1

## 2018-04-07 MED ORDER — DILTIAZEM HCL 25 MG/5ML IV SOLN
10.0000 mg | Freq: Four times a day (QID) | INTRAVENOUS | Status: DC | PRN
  Administered 2018-04-07: 10 mg via INTRAVENOUS

## 2018-04-07 MED ORDER — DILTIAZEM HCL 25 MG/5ML IV SOLN
10.0000 mg | Freq: Once | INTRAVENOUS | Status: AC
  Administered 2018-04-07: 10 mg via INTRAVENOUS
  Filled 2018-04-07: qty 5

## 2018-04-07 MED ORDER — TORSEMIDE 20 MG PO TABS
20.0000 mg | ORAL_TABLET | Freq: Every day | ORAL | Status: DC
  Administered 2018-04-07 – 2018-04-10 (×4): 20 mg via ORAL
  Filled 2018-04-07 (×4): qty 1

## 2018-04-07 MED ORDER — GABAPENTIN 100 MG PO CAPS
100.0000 mg | ORAL_CAPSULE | Freq: Every day | ORAL | Status: DC
  Administered 2018-04-07 – 2018-04-09 (×3): 100 mg via ORAL
  Filled 2018-04-07 (×3): qty 1

## 2018-04-07 MED ORDER — POLYVINYL ALCOHOL 1.4 % OP SOLN
1.0000 [drp] | Freq: Two times a day (BID) | OPHTHALMIC | Status: DC
  Administered 2018-04-07 – 2018-04-10 (×6): 1 [drp] via OPHTHALMIC
  Filled 2018-04-07: qty 15

## 2018-04-07 MED ORDER — HYDROCODONE-ACETAMINOPHEN 5-325 MG PO TABS
1.0000 | ORAL_TABLET | Freq: Four times a day (QID) | ORAL | Status: DC | PRN
  Administered 2018-04-07 – 2018-04-10 (×5): 1 via ORAL
  Filled 2018-04-07 (×5): qty 1

## 2018-04-07 MED ORDER — APIXABAN 2.5 MG PO TABS
2.5000 mg | ORAL_TABLET | Freq: Two times a day (BID) | ORAL | Status: DC
  Administered 2018-04-07 – 2018-04-08 (×2): 2.5 mg via ORAL
  Filled 2018-04-07 (×2): qty 1

## 2018-04-07 MED ORDER — IPRATROPIUM-ALBUTEROL 0.5-2.5 (3) MG/3ML IN SOLN: 3.0000 mL | Freq: Four times a day (QID) | RESPIRATORY_TRACT | Status: DC | PRN

## 2018-04-07 MED ORDER — ROSUVASTATIN CALCIUM 10 MG PO TABS
20.0000 mg | ORAL_TABLET | Freq: Every day | ORAL | Status: DC
  Administered 2018-04-07 – 2018-04-09 (×3): 20 mg via ORAL
  Filled 2018-04-07 (×3): qty 2

## 2018-04-07 MED ORDER — TIOTROPIUM BROMIDE MONOHYDRATE 18 MCG IN CAPS
18.0000 ug | ORAL_CAPSULE | Freq: Every day | RESPIRATORY_TRACT | Status: DC
  Administered 2018-04-07 – 2018-04-10 (×4): 18 ug via RESPIRATORY_TRACT
  Filled 2018-04-07: qty 5

## 2018-04-07 MED ORDER — ALLOPURINOL 300 MG PO TABS
300.0000 mg | ORAL_TABLET | Freq: Every day | ORAL | Status: DC
  Administered 2018-04-07 – 2018-04-10 (×4): 300 mg via ORAL
  Filled 2018-04-07 (×4): qty 1

## 2018-04-07 MED ORDER — ACETAMINOPHEN 650 MG RE SUPP: 650.0000 mg | Freq: Four times a day (QID) | RECTAL | Status: DC | PRN

## 2018-04-07 MED ORDER — SODIUM CHLORIDE 0.9 % IV BOLUS
500.0000 mL | Freq: Once | INTRAVENOUS | Status: AC
  Administered 2018-04-07: 500 mL via INTRAVENOUS

## 2018-04-07 MED ORDER — COLLAGENASE 250 UNIT/GM EX OINT
1.0000 "application " | TOPICAL_OINTMENT | Freq: Every evening | CUTANEOUS | Status: DC
  Administered 2018-04-07 – 2018-04-09 (×4): 1 via TOPICAL
  Filled 2018-04-07: qty 30

## 2018-04-07 MED ORDER — ONDANSETRON HCL 4 MG/2ML IJ SOLN: 4.0000 mg | Freq: Four times a day (QID) | INTRAMUSCULAR | Status: DC | PRN

## 2018-04-07 MED ORDER — ONDANSETRON HCL 4 MG PO TABS: 4.0000 mg | ORAL_TABLET | Freq: Four times a day (QID) | ORAL | Status: DC | PRN

## 2018-04-07 NOTE — Telephone Encounter (Signed)
I attempted to contact the patient regarding his angio with Dr. Delana Meyer. I left a message for a return call from  the patient and for the coordinator to get the patient scheduled for his angio procedure.

## 2018-04-07 NOTE — ED Triage Notes (Signed)
Pt in via [facility] from [facility], concerns for Sepsis due to increasing shortness of breath and generalized weakness.  Pt states, "I cant even walk."  EDP to bedside.

## 2018-04-07 NOTE — H&P (Addendum)
[place] [facility] [facility] [place] NAME: Gregory Tyler    MR#:  [id number]  DATE OF BIRTH:  Aug 24, 1940  DATE OF ADMISSION:  04/07/2018  PRIMARY CARE PHYSICIAN: [name], MD   REQUESTING/REFERRING PHYSICIAN: Dr. Charlotte Crumb  CHIEF COMPLAINT:   Chief Complaint  Patient presents with  . Weakness  . Shortness of Breath    HISTORY OF PRESENT ILLNESS:  Gregory Tyler  is a 76 y.o. male with a known history of paroxysmal atrial fibrillation, history of prostate cancer, hypertension, hyperlipidemia, diabetes depression, chronic combined systolic diastolic CHF, anxiety who was sent from the wound center due to worsening right lower extremity wound and also noted to be complaining of some shortness of breath.  Patient presented to the ER from the wound center but was not hypoxic or severely hypotensive but was noted to have significantly elevated heart rates in the 130s to 140s.  Patient was noted to be in atrial fibrillation with rapid ventricular response.  Patient denies any palpitations, chest pains, nausea, vomiting, diaphoresis or any other associated symptoms presently.  Patient was given 1 dose of IV Cardizem and heart rate did slow down to the 120s but they are still labile and therefore hospitalist services were contacted for admission.  PAST MEDICAL HISTORY:   Past Medical History:  Diagnosis Date  . Anemia   . Anxiety   . Arthritis   . Cardiomyopathy ([place])    a. 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  Marland Kitchen Chronic combined systolic (congestive) and diastolic (congestive) heart failure ([place])    a. 05/2015 TEE: EF 35-40% (in setting of Afib); b. 08/2017 Echo: EF 55-60%, no rwma, Gr2DD; b. 01/2018 Echo: EF 55-60%, mild MR. Nl LA size. Nl RV fxn.  . Chronic Pelvic pain   . Coronary artery disease    a. 05/2015 CTA Chest: 3 vessel coronary Ca2+.  . Depression   . Diabetes mellitus without complication ([place])   . Emphysema  lung ([place])   . Gastric ulcer   . Gout   . Hyperlipidemia   . Hypertension   . Nephrolithiasis   . PAF (paroxysmal atrial fibrillation) ([place])    a. 05/2015 s/p DCCV; b. 03/2016 s/p DCCV; c. CHA2DS2VASc = 6 (coumadin).  . Prostate cancer [facility])    Prostate  . Stage III chronic kidney disease ([place])   . Weight loss    50 lb since Apr 24, 2015    PAST SURGICAL HISTORY:   Past Surgical History:  Procedure Laterality Date  . APPENDECTOMY    . BACK SURGERY    . CHOLECYSTECTOMY    . DIALYSIS/PERMA CATHETER INSERTION N/A 01/22/2018   Procedure: DIALYSIS/PERMA CATHETER INSERTION;  Surgeon: Katha Cabal, MD;  Location: [place] [facility];  Service: Cardiovascular;  Laterality: N/A;  . DIALYSIS/PERMA CATHETER REMOVAL N/A 04/01/2018   Procedure: DIALYSIS/PERMA CATHETER REMOVAL;  Surgeon: Katha Cabal, MD;  Location: [place] [facility];  Service: Cardiovascular;  Laterality: N/A;  . ELECTROPHYSIOLOGIC STUDY N/A 06/17/2015   Procedure: CARDIOVERSION;  Surgeon: Wellington Hampshire, MD;  Location: [facility] OR[facility];  Service: Cardiovascular;  Laterality: N/A;  . ELECTROPHYSIOLOGIC STUDY N/A 03/30/2016   Procedure: CARDIOVERSION;  Surgeon: Minna Merritts, MD;  Location: [facility] OR[facility];  Service: Cardiovascular;  Laterality: N/A;  . GASTRIC BYPASS    . HEMORRHOID SURGERY    . LOWER EXTREMITY ANGIOGRAPHY Right 01/23/2018   Procedure: LOWER EXTREMITY ANGIOGRAPHY;  Surgeon: Leotis Pain  S, MD;  Location: [place] [facility];  Service: Cardiovascular;  Laterality: Right;  . LOWER EXTREMITY ANGIOGRAPHY Left 01/30/2018   Procedure: LOWER EXTREMITY ANGIOGRAPHY;  Surgeon: Algernon Huxley, MD;  Location: [place] [facility];  Service: Cardiovascular;  Laterality: Left;  . pilonidal cyst    . TEE WITHOUT CARDIOVERSION N/A 06/17/2015   Procedure: TRANSESOPHAGEAL ECHOCARDIOGRAM (TEE);  Surgeon: Wellington Hampshire, MD;  Location: [facility] ORS;  Service: Cardiovascular;  Laterality: N/A;    SOCIAL HISTORY:   Social History     Tobacco Use  . Smoking status: Former Smoker    Last attempt to quit: 12/19/1940    Years since quitting: 77.3  . Smokeless tobacco: Former Systems developer    Types: Chew    Quit date: 11/19/2014  Substance Use Topics  . Alcohol use: No    FAMILY HISTORY:   Family History  Problem Relation Age of Onset  . CAD Mother   . Stroke Mother   . COPD Mother   . Heart failure Mother   . Diabetes Other     DRUG ALLERGIES:   Allergies  Allergen Reactions  . Aspirin Other (See Comments)    GI Bleeding  . Tuberculin Tests Other (See Comments)    unknown  . Codeine Other (See Comments)    BLISTERS     REVIEW OF SYSTEMS:   Review of Systems  Constitutional: Negative for fever and weight loss.  HENT: Negative for congestion, nosebleeds and tinnitus.   Eyes: Negative for blurred vision, double vision and redness.  Respiratory: Positive for shortness of breath. Negative for cough and hemoptysis.   Cardiovascular: Negative for chest pain, orthopnea, leg swelling and PND.  Gastrointestinal: Negative for abdominal pain, diarrhea, melena, nausea and vomiting.  Genitourinary: Negative for dysuria, hematuria and urgency.  Musculoskeletal: Negative for falls and joint pain.  Neurological: Positive for weakness (generalized). Negative for dizziness, tingling, sensory change, focal weakness, seizures and headaches.  Endo/Heme/Allergies: Negative for polydipsia. Does not bruise/bleed easily.  Psychiatric/Behavioral: Negative for depression and memory loss. The patient is not nervous/anxious.     MEDICATIONS AT HOME:   Prior to Admission medications   Medication Sig Start Date End Date Taking? Authorizing Provider  acetaminophen (TYLENOL) 325 MG tablet Take 2 tablets (650 mg total) by mouth every 6 (six) hours as needed for mild pain (or Fever >/= 101). 09/15/17  Yes Wieting, Richard, MD  allopurinol (ZYLOPRIM) 300 MG tablet Take 300 mg by mouth daily.    Yes [provider]  apixaban  (ELIQUIS) 2.5 MG TABS tablet Take 2.5 mg by mouth every 12 (twelve) hours.   Yes [provider]  collagenase (SANTYL) ointment Apply 1 application topically every evening. Apply to eschar on RLE wound   Yes [provider]  gabapentin (NEURONTIN) 100 MG capsule Take 1 capsule (100 mg total) by mouth at bedtime. 09/15/17  Yes Wieting, Richard, MD  HYDROcodone-acetaminophen (NORCO/VICODIN) 5-325 MG tablet Take 1 tablet by mouth every 6 (six) hours as needed for moderate pain or severe pain. 01/29/18  Yes Pyreddy, Reatha Harps, MD  Hypromellose (ARTIFICIAL TEARS) 0.4 % SOLN Place 1 drop into both eyes 2 (two) times daily.   Yes [provider]  Povidone-Iodine (BETADINE EX) Apply topically every evening. Apply to left heel and left lateral foot   Yes [provider]  rosuvastatin (CRESTOR) 20 MG tablet Take 20 mg by mouth at bedtime.    Yes [provider]  tiotropium (SPIRIVA) 18 MCG inhalation capsule Place  18 mcg into inhaler and inhale daily.    Yes [provider]  torsemide (DEMADEX) 20 MG tablet Take 20 mg by mouth daily.   Yes [provider]      VITAL SIGNS:  Blood pressure (!) 131/94, temperature 98.2 F (36.8 C), temperature source Oral, resp. rate (!) 23, height 5\' 8"  (1.727 m), weight 72.6 kg, SpO2 97 %.  PHYSICAL EXAMINATION:  Physical Exam  GENERAL:  77 y.o.-year-old patient lying in the bed in no acute distress.  EYES: Pupils equal, round, reactive to light and accommodation. No scleral icterus. Extraocular muscles intact.  HEENT: Head atraumatic, normocephalic. Oropharynx and nasopharynx clear. No oropharyngeal erythema, moist oral mucosa  NECK:  Supple, no jugular venous distention. No thyroid enlargement, no tenderness.  LUNGS: Poor Resp. effort, no wheezing, rales, rhonchi. No use of accessory muscles of respiration.  CARDIOVASCULAR: S1, S2 RRR. No murmurs, rubs, gallops, clicks.  ABDOMEN: Soft, nontender, nondistended.  Bowel sounds present. No organomegaly or mass.  EXTREMITIES: +1-2 dependent edema b/l, No cyanosis, or clubbing. + 2 pedal & radial pulses b/l.   NEUROLOGIC: Cranial nerves II through XII are intact. No focal Motor or sensory deficits appreciated b/l. Globally weak.  PSYCHIATRIC: The patient is alert and oriented x 3. Good affect.  SKIN: No obvious rash, lesion, RLE pressure ulcer behind right calf Stage III.    LABORATORY PANEL:   CBC Recent Labs  Lab 04/07/18 1539  WBC 10.0  HGB 10.5*  HCT 34.1*  PLT 289   ------------------------------------------------------------------------------------------------------------------  Chemistries  Recent Labs  Lab 04/07/18 1539  NA 134*  K 3.8  CL 105  CO2 21*  GLUCOSE 297*  BUN 39*  CREATININE 1.24  CALCIUM 8.3*  AST 45*  ALT 54*  ALKPHOS 104  BILITOT 0.5   ------------------------------------------------------------------------------------------------------------------  Cardiac Enzymes Recent Labs  Lab 04/07/18 1539  TROPONINI 0.05*   ------------------------------------------------------------------------------------------------------------------  RADIOLOGY:  Dg Chest 2 View  Result Date: 04/07/2018 CLINICAL DATA:  Shortness of breath. EXAM: CHEST - 2 VIEW COMPARISON:  Radiograph of January 16, 2018. FINDINGS: The heart size and mediastinal contours are within normal limits. Both lungs are clear. No pneumothorax or significant pleural effusion is noted. The visualized skeletal structures are unremarkable. IMPRESSION: No active cardiopulmonary disease. Electronically Signed   By: Marijo Conception, M.D.   On: 04/07/2018 16:21   Dg Tibia/fibula Right  Result Date: 04/07/2018 CLINICAL DATA:  Right lower leg pain EXAM: RIGHT TIBIA AND FIBULA - 2 VIEW COMPARISON:  None. FINDINGS: No acute fracture or dislocation is noted. Vascular calcifications are seen. No gross soft tissue abnormality is noted. IMPRESSION: No acute bony  abnormality is noted. Electronically Signed   By: Inez Catalina M.D.   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  1.  Atrial fibrillation with rapid ventricular response-patient presented to the hospital with heart rates in the 130s 140s.  He is complaining of some shortness of breath but denies any chest pains or palpitations. - Patient is currently on no rate controlling medications.  I will start the patient on some low-dose Cardizem, continue IV Cardizem as needed.  If needed would consider placing him  on a Cardizem drip - We will keep on telemetry, get a cardiology consult [facility] - discussed with Dr. Fletcher Anon).  Patient had a recent echocardiogram and September of this year which showed normal ejection fraction. - Continue Eliquis for long-term anticoagulation.  2.  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  Patient is followed at the wound center.  I do not appreciate any surrounding cellulitis or induration. - We will get wound team consult for local wound care for now.  3.  COPD-no acute exacerbation.  Continue Spiriva, PRN DuoNeb's.    4.  Hyperlipidemia-continue Crestor.  5.  Neuropathy-continue gabapentin.  6.  History of gout-continue allopurinol.    All the records are reviewed and case discussed with ED provider. Management plans discussed with the patient, family and they are in agreement.  CODE STATUS: DNR  TOTAL TIME TAKING CARE OF THIS PATIENT: 45 minutes.    Henreitta Leber M.D on 04/07/2018 at 5:29 PM  Between 7am  to 6pm - Pager - [phone]  After 6pm go to [email or web address] - password [email or web address] [facility]  Office  [phone]  CC: Primary care physician; [name], MD

## 2018-04-07 NOTE — ED Notes (Signed)
Hospitalist to bedside at this time 

## 2018-04-07 NOTE — ED Notes (Signed)
Lab notified to add on PT-INR.

## 2018-04-07 NOTE — ED Notes (Signed)
Attempted to call report, RN unavailable at this time.

## 2018-04-07 NOTE — ED Notes (Signed)
Patient transported to X-ray 

## 2018-04-07 NOTE — ED Provider Notes (Signed)
[facility] Emergency Department Provider Note  ____________________________________________   I have reviewed the triage vital signs and the nursing notes. Where available I have reviewed prior notes and, if possible and indicated, outside hospital notes.    HISTORY  Chief Complaint Weakness and Shortness of Breath    HPI Gregory Tyler is a 77 y.o. male with a complicated medical history including chronic combined congestive heart failure apparently improved to EF of 55 to 60% in 2019 thought mostly to be related to atrial fibrillation, history of poor peripheral flow with chronic ulcers, bedbound as to the wound center.  History of stage III kidney failure, CAD, PAF, status post remote cardioversion, states that he has been short of breath over the last couple weeks, to the point where he "cannot do anything even pushed by wheelchair, he also states that they are concerned that the wound on the back of his calf is getting worse and might be cellulitic.  Patient states the wounds have been there for months.  He denies any fever or chills or cough.  He cannot further elucidate his shortness of breath he denies pleuritic chest pain or any chest pain.  He does state that he feels like sometimes his heart is going quickly.     Past Medical History:  Diagnosis Date  . Anemia   . Anxiety   . Arthritis   . Cardiomyopathy ([place])    a. 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  Marland Kitchen Chronic combined systolic (congestive) and diastolic (congestive) heart failure ([place])    a. 05/2015 TEE: EF 35-40% (in setting of Afib); b. 08/2017 Echo: EF 55-60%, no rwma, Gr2DD; b. 01/2018 Echo: EF 55-60%, mild MR. Nl LA size. Nl RV fxn.  . Chronic Pelvic pain   . Coronary artery disease    a. 05/2015 CTA Chest: 3 vessel coronary Ca2+.  . Depression   . Diabetes mellitus without complication ([place])   . Emphysema lung ([place])   . Gastric ulcer   . Gout   . Hyperlipidemia    . Hypertension   . Nephrolithiasis   . PAF (paroxysmal atrial fibrillation) ([place])    a. 05/2015 s/p DCCV; b. 03/2016 s/p DCCV; c. CHA2DS2VASc = 6 (coumadin).  . Prostate cancer [facility])    Prostate  . Stage III chronic kidney disease ([place])   . Weight loss    50 lb since Apr 24, 2015    Patient Active Problem List   Diagnosis Date Noted  . Protein-calorie malnutrition, severe 01/21/2018  . Acute renal failure superimposed on chronic kidney disease ([place])   . Palliative care by specialist   . DNR (do not resuscitate)   . Sepsis ([place]) 01/13/2018  . Atherosclerotic PVD with ulceration ([place]) 01/09/2018  . Diarrhea 12/19/2017  . Pressure injury of skin 11/16/2017  . Enteritis 11/15/2017  . Malnutrition of moderate degree 09/13/2017  . Elevated INR 09/02/2017  . Paroxysmal atrial fibrillation ([place]) 10/25/2016  . Weakness generalized   . Hyperlipidemia 08/08/2015  . Diabetes ([place]) 05/19/2015  . Hyperkalemia   . Dehydration   . Orthostatic hypotension 05/09/2015  . Congestive dilated cardiomyopathy ([place]) 05/09/2015  . Polypharmacy 04/27/2015  . Chronic diastolic CHF (congestive heart failure) ([place]) 04/25/2015  . SOB (shortness of breath)   . Coronary artery disease involving native coronary artery of native heart without angina pectoris   . Essential hypertension     Past Surgical History:  Procedure Laterality Date  . APPENDECTOMY    .  BACK SURGERY    . CHOLECYSTECTOMY    . DIALYSIS/PERMA CATHETER INSERTION N/A 01/22/2018   Procedure: DIALYSIS/PERMA CATHETER INSERTION;  Surgeon: Katha Cabal, MD;  Location: [place] [facility];  Service: Cardiovascular;  Laterality: N/A;  . DIALYSIS/PERMA CATHETER REMOVAL N/A 04/01/2018   Procedure: DIALYSIS/PERMA CATHETER REMOVAL;  Surgeon: Katha Cabal, MD;  Location: [place] [facility];  Service: Cardiovascular;  Laterality: N/A;  . ELECTROPHYSIOLOGIC STUDY N/A 06/17/2015   Procedure: CARDIOVERSION;  Surgeon: Wellington Hampshire, MD;   Location: [facility] ORS;  Service: Cardiovascular;  Laterality: N/A;  . ELECTROPHYSIOLOGIC STUDY N/A 03/30/2016   Procedure: CARDIOVERSION;  Surgeon: Minna Merritts, MD;  Location: [facility] ORS;  Service: Cardiovascular;  Laterality: N/A;  . GASTRIC BYPASS    . HEMORRHOID SURGERY    . LOWER EXTREMITY ANGIOGRAPHY Right 01/23/2018   Procedure: LOWER EXTREMITY ANGIOGRAPHY;  Surgeon: Algernon Huxley, MD;  Location: [place] [facility];  Service: Cardiovascular;  Laterality: Right;  . LOWER EXTREMITY ANGIOGRAPHY Left 01/30/2018   Procedure: LOWER EXTREMITY ANGIOGRAPHY;  Surgeon: Algernon Huxley, MD;  Location: [place] [facility];  Service: Cardiovascular;  Laterality: Left;  . pilonidal cyst    . TEE WITHOUT CARDIOVERSION N/A 06/17/2015   Procedure: TRANSESOPHAGEAL ECHOCARDIOGRAM (TEE);  Surgeon: Wellington Hampshire, MD;  Location: [facility];  Service: Cardiovascular;  Laterality: N/A;    Prior to Admission medications   Medication Sig Start Date End Date Taking? Authorizing Provider  acetaminophen (TYLENOL) 325 MG tablet Take 2 tablets (650 mg total) by mouth every 6 (six) hours as needed for mild pain (or Fever >/= 101). 09/15/17   Loletha Grayer, MD  allopurinol (ZYLOPRIM) 300 MG tablet Take 300 mg by mouth daily. (0900)    [provider]  ARTIFICIAL TEARS 0.4 % SOLN Apply 1 drop to eye 2 (two) times daily. (0900 & 1700)    [provider]  atorvastatin (LIPITOR) 20 MG tablet Take 20 mg by mouth daily at 8 pm. (2100)    [provider]  ELIQUIS 2.5 MG TABS tablet Take 2.5 mg by mouth every 12 (twelve) hours. (0900 & 2100) 12/12/17   [provider]  furosemide (LASIX) 20 MG tablet Take 20 mg by mouth daily. (0900)    [provider]  gabapentin (NEURONTIN) 100 MG capsule Take 1 capsule (100 mg total) by mouth at bedtime. Patient taking differently: Take 100 mg by mouth at bedtime. (2100) 09/15/17   Loletha Grayer, MD  HYDROcodone-acetaminophen (NORCO/VICODIN)  5-325 MG tablet Take 1 tablet by mouth every 6 (six) hours as needed for moderate pain or severe pain. 01/29/18   Saundra Shelling, MD  rosuvastatin (CRESTOR) 20 MG tablet Take 20 mg by mouth daily. (2100)    [provider]  tiotropium (SPIRIVA) 18 MCG inhalation capsule Place 18 mcg into inhaler and inhale daily. (0900)    [provider]    Allergies Aspirin and Codeine  Family History  Problem Relation Age of Onset  . CAD Mother   . Stroke Mother   . COPD Mother   . Heart failure Mother   . Diabetes Other     Social History Social History   Tobacco Use  . Smoking status: Former Smoker    Last attempt to quit: 12/19/1940    Years since quitting: 77.3  . Smokeless tobacco: Former Systems developer    Types: Chew    Quit date: 11/19/2014  Substance Use Topics  . Alcohol use: No  . Drug use:  No    Review of Systems Constitutional: No fever/chills Eyes: No visual changes. ENT: No sore throat. No stiff neck no neck pain Cardiovascular: Denies chest pain. Respiratory: +shortness of breath. Gastrointestinal:   no vomiting.  No diarrhea.  No constipation. Genitourinary: Negative for dysuria. Musculoskeletal: Negative lower extremity swelling Skin: Negative for rash. Neurological: Negative for severe headaches, focal weakness or numbness.   ____________________________________________   PHYSICAL EXAM:  VITAL SIGNS: ED Triage Vitals  Enc Vitals Group     BP 04/07/18 1532 129/84     Pulse --      Resp 04/07/18 1532 (!) 21     Temp 04/07/18 1532 98.2 F (36.8 C)     Temp Source 04/07/18 1532 Oral     SpO2 04/07/18 1532 98 %     Weight 04/07/18 1534 160 lb (72.6 kg)     Height 04/07/18 1534 5\' 8"  (1.727 m)     Head Circumference --      Peak Flow --      Pain Score 04/07/18 1533 8     Pain Loc --      Pain Edu? --      Excl. in [place]? --     Constitutional: Alert and oriented. Well appearing and in no acute distress. Eyes: Conjunctivae are normal Head:  Atraumatic HEENT: No congestion/rhinnorhea. Mucous membranes are moist.  Oropharynx non-erythematous Neck:   Nontender with no meningismus, no masses, no stridor Cardiovascular: Echocardiac noted, regular rhythm. Grossly normal heart sounds.  Good peripheral circulation. Respiratory: Normal respiratory effort.  No retractions. Lungs CTAB. Abdominal: Soft and nontender. No distention. No guarding no rebound Back:  There is no focal tenderness or step off.  there is no midline tenderness there are no lesions noted. there is no CVA tenderness Musculoskeletal: No lower extremity tenderness, no upper extremity tenderness. No joint effusions, chronic appearing bilateral lower heel pain, chronic appearing ischemia to the tip of the right great toe, both feet are equally warm, both are slightly cool, there is no crepitus, there is a large angry looking wound with a smell to it to the posterior aspect of the right calf.  Some mild erythema around it.  No crepitus palpated. Neurologic:  Normal speech and language. No gross focal neurologic deficits are appreciated.  Skin:  Skin is warm, dry and intact. No rash noted. Psychiatric: Mood and affect are normal. Speech and behavior are normal.  ____________________________________________   LABS (all labs ordered are listed, but only abnormal results are displayed)  Labs Reviewed  CULTURE, BLOOD (ROUTINE X 2)  CULTURE, BLOOD (ROUTINE X 2)  CBC WITH DIFFERENTIAL/PLATELET  COMPREHENSIVE METABOLIC PANEL  TROPONIN I  LACTIC ACID, PLASMA  LACTIC ACID, PLASMA  URINALYSIS, COMPLETE (UACMP) WITH MICROSCOPIC  BRAIN NATRIURETIC PEPTIDE    Pertinent labs  results that were available during my care of the patient were reviewed by me and considered in my medical decision making (see chart for details). ____________________________________________  EKG  I personally interpreted any EKGs ordered by me or triage Patient has a known left bundle branch block, that  is present, no significant change in the morphology that I see from prior however the rate is increased to 143, it is unclear exactly what the underlying rhythm is, looks to be most likely atrial fibrillation ____________________________________________  RADIOLOGY  Pertinent labs & imaging results that were available during my care of the patient were reviewed by me and considered in my medical decision making (see chart for details). If  possible, patient and/or family made aware of any abnormal findings.  No results found. ____________________________________________    PROCEDURES  Procedure(s) performed: None  Procedures  Critical Care performed: None  ____________________________________________   INITIAL IMPRESSION / ASSESSMENT AND PLAN / ED COURSE  Pertinent labs & imaging results that were available during my care of the patient were reviewed by me and considered in my medical decision making (see chart for details).  She here for several reasons.  The first is short of breath, does appear that he is back in A. fib, that is likely why, he does have a history of A. fib associated CHF.  Is not having chest pain.  The second reason the that he is here is that his wounds are looking worse on his legs.  Is very poor circulation in his legs.  None of them appear to be cold or requiring immediate amputation however which is good.  I am concerned about the calf lesion.  This is apparently why the wound center sent him in.  There were the becoming infected from this.  We will initiate a sepsis work-up for him, I will obtain x-rays of his lower extremity although I do not see any evidence of necrotizing fasciitis clinically, we will watch his heart rate as he is a little bit anxious by being here, to see if it comes down on his own without further intervention, in the context of possible infection I do not wish to be too aggressive with rate limiting medications we will start hydration on him  however, he does have a history of A. fib associated CHF and we must be careful I like to get a chest x-ray before given too much fluid    ____________________________________________   FINAL CLINICAL IMPRESSION(S) / ED DIAGNOSES  Final diagnoses:  None      This chart was dictated using voice recognition software.  Despite best efforts to proofread,  errors can occur which can change meaning.      Schuyler Amor, MD 04/07/18 [phone]

## 2018-04-07 NOTE — ED Notes (Signed)
Attempted to call report, RN still unavailable.  Bedside report to be given upon arrival to floor.

## 2018-04-08 ENCOUNTER — Encounter: Payer: Self-pay | Admitting: Physician Assistant

## 2018-04-08 DIAGNOSIS — I4891 Unspecified atrial fibrillation: Secondary | ICD-10-CM | POA: Diagnosis not present

## 2018-04-08 LAB — MAGNESIUM: Magnesium: 2 mg/dL (ref 1.7–2.4)

## 2018-04-08 LAB — CBC
HCT: 29.5 % — ABNORMAL LOW (ref 39.0–52.0)
Hemoglobin: 9.4 g/dL — ABNORMAL LOW (ref 13.0–17.0)
MCH: 29.7 pg (ref 26.0–34.0)
MCHC: 31.9 g/dL (ref 30.0–36.0)
MCV: 93.4 fL (ref 80.0–100.0)
Platelets: 252 10*3/uL (ref 150–400)
RBC: 3.16 MIL/uL — ABNORMAL LOW (ref 4.22–5.81)
RDW: 15.4 % (ref 11.5–15.5)
WBC: 9 10*3/uL (ref 4.0–10.5)
nRBC: 0 % (ref 0.0–0.2)

## 2018-04-08 LAB — TSH: TSH: 10.522 u[IU]/mL — ABNORMAL HIGH (ref 0.350–4.500)

## 2018-04-08 LAB — BASIC METABOLIC PANEL
Anion gap: 8 (ref 5–15)
BUN: 42 mg/dL — ABNORMAL HIGH (ref 8–23)
CO2: 20 mmol/L — ABNORMAL LOW (ref 22–32)
Calcium: 7.8 mg/dL — ABNORMAL LOW (ref 8.9–10.3)
Chloride: 104 mmol/L (ref 98–111)
Creatinine, Ser: 1.31 mg/dL — ABNORMAL HIGH (ref 0.61–1.24)
GFR calc Af Amer: >60 mL/min (ref >60)
GFR calc non Af Amer: 52 mL/min — ABNORMAL LOW (ref >60)
Glucose, Bld: 367 mg/dL — ABNORMAL HIGH (ref 70–99)
Potassium: 4 mmol/L (ref 3.5–5.1)
Sodium: 132 mmol/L — ABNORMAL LOW (ref 135–145)

## 2018-04-08 LAB — HEMOGLOBIN A1C
Hgb A1c MFr Bld: 7.7 % — ABNORMAL HIGH (ref 4.8–5.6)
Mean Plasma Glucose: 174.29 mg/dL

## 2018-04-08 LAB — GLUCOSE, CAPILLARY
Glucose-Capillary: 262 mg/dL — ABNORMAL HIGH (ref 70–99)
Glucose-Capillary: 201 mg/dL — ABNORMAL HIGH (ref 70–99)
Glucose-Capillary: 339 mg/dL — ABNORMAL HIGH (ref 70–99)

## 2018-04-08 LAB — TROPONIN I: Troponin I: 0.06 ng/mL (ref <0.03)

## 2018-04-08 MED ORDER — INSULIN ASPART 100 UNIT/ML ~~LOC~~ SOLN
0.0000 [IU] | Freq: Every day | SUBCUTANEOUS | Status: DC
  Administered 2018-04-08: 4 [IU] via SUBCUTANEOUS
  Administered 2018-04-09: 2 [IU] via SUBCUTANEOUS
  Filled 2018-04-08 (×2): qty 1

## 2018-04-08 MED ORDER — HEPARIN (PORCINE) 25000 UT/250ML-% IV SOLN
1150.0000 [IU]/h | INTRAVENOUS | Status: DC
  Administered 2018-04-08: 950 [IU]/h via INTRAVENOUS
  Administered 2018-04-10: 1050 [IU]/h via INTRAVENOUS
  Filled 2018-04-08 (×2): qty 250

## 2018-04-08 MED ORDER — HEPARIN BOLUS VIA INFUSION
1700.0000 [IU] | Freq: Once | INTRAVENOUS | Status: AC
  Administered 2018-04-08: 1700 [IU] via INTRAVENOUS
  Filled 2018-04-08: qty 1700

## 2018-04-08 MED ORDER — INSULIN ASPART 100 UNIT/ML ~~LOC~~ SOLN
0.0000 [IU] | Freq: Three times a day (TID) | SUBCUTANEOUS | Status: DC
  Administered 2018-04-08: 3 [IU] via SUBCUTANEOUS
  Administered 2018-04-08: 5 [IU] via SUBCUTANEOUS
  Administered 2018-04-09 (×2): 2 [IU] via SUBCUTANEOUS
  Administered 2018-04-10: 1 [IU] via SUBCUTANEOUS
  Administered 2018-04-10: 2 [IU] via SUBCUTANEOUS
  Filled 2018-04-08 (×6): qty 1

## 2018-04-08 NOTE — Consult Note (Addendum)
ANTICOAGULATION CONSULT NOTE - Initial Consult  Pharmacy Consult for heparin Indication: atrial fibrillation  Allergies  Allergen Reactions  . Aspirin Other (See Comments)    GI Bleeding  . Tuberculin Tests Other (See Comments)    unknown  . Codeine Other (See Comments)    BLISTERS     Patient Measurements: Height: 5\' 8"  (172.7 cm) Weight: 151 lb 11.2 oz (68.8 kg) IBW/kg (Calculated) : 68.4 Heparin Dosing Weight: 68.8 kg  Vital Signs: Temp: 97.6 F (36.4 C) (12/17 0748) Temp Source: Oral (12/17 0503) BP: 98/71 (12/17 0748) Pulse Rate: 80 (12/17 0748)  Labs: Recent Labs    04/07/18 1539 04/07/18 1706 04/08/18 0236  HGB 10.5*  --  9.4*  HCT 34.1*  --  29.5*  PLT 289  --  252  LABPROT  --  16.1*  --   INR  --  1.30  --   CREATININE 1.24  --  1.31*  TROPONINI 0.05*  --  0.06*    Estimated Creatinine Clearance: 45 mL/min (A) (by C-G formula based on SCr of 1.31 mg/dL (H)).   Medical History: Past Medical History:  Diagnosis Date  . Anemia   . Anxiety   . Arthritis   . Cardiomyopathy ([place])    a. 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  Marland Kitchen Chronic combined systolic (congestive) and diastolic (congestive) heart failure ([place])    a. 05/2015 TEE: EF 35-40% (in setting of Afib); b. 08/2017 Echo: EF 55-60%, no rwma, Gr2DD; b. 01/2018 Echo: EF 55-60%, mild MR. Nl LA size. Nl RV fxn.  . Chronic Pelvic pain   . Coronary artery disease    a. 05/2015 CTA Chest: 3 vessel coronary Ca2+.  . Depression   . Diabetes mellitus without complication ([place])   . Emphysema lung ([place])   . Gastric ulcer   . Gout   . Hyperlipidemia   . Hypertension   . Nephrolithiasis   . PAF (paroxysmal atrial fibrillation) ([place])    a. 05/2015 s/p DCCV; b. 03/2016 s/p DCCV; c. CHA2DS2VASc = 6 (coumadin).  . Prostate cancer [facility])    Prostate  . Stage III chronic kidney disease ([place])   . Weight loss    50 lb since Apr 24, 2015    Medications:  Scheduled:  . allopurinol  300 mg  Oral Daily  . collagenase  1 application Topical QPM  . diltiazem  30 mg Oral Q6H  . gabapentin  100 mg Oral QHS  . insulin aspart  0-5 Units Subcutaneous QHS  . insulin aspart  0-9 Units Subcutaneous TID WC  . polyvinyl alcohol  1 drop Both Eyes BID  . rosuvastatin  20 mg Oral QHS  . tiotropium  18 mcg Inhalation Daily  . torsemide  20 mg Oral Daily    Assessment: 77 yo male admitted on 12/16 with afib. Patient presented from wound clinic after experiencing tachycardia without hypotension. Takes apixaban PTA for afib. Last dose this AM at 0900.   Goal of Therapy:  Heparin level 0.3-0.7 units/ml Monitor platelets by anticoagulation protocol: Yes   Plan:  Baseline PTT slightly elevated at 16.1. Will order heparin to start when next apixaban dose due at 2100. Start heparin with half bolus of 1700 units, followed by continuous heparin infusion of 950 units/hr. Will follow up with heparin level on 12/18 at 0500. Will monitor CBC with AM labs.  Pharmacy will continue to monitor.  Paticia Stack, PharmD Pharmacy Resident  04/08/2018 11:26  AM

## 2018-04-08 NOTE — Clinical Social Work Note (Signed)
Clinical Social Work Assessment  Patient Details  Name: Gregory Tyler MRN: [id number] Date of Birth: 1940-09-21  Date of referral:  04/08/18               Reason for consult:  Facility Placement                Permission sought to share information with:  Facility Sport and exercise psychologist Permission granted to share information::  Yes, Verbal Permission Granted  Name::     Bradlee, [place] [phone]  [phone] or Lynnell Chad Daughter   [phone]   Agency::  SNF admissions  Relationship::     Contact Information:     Housing/Transportation Living arrangements for the past 2 months:  [place] of Information:  Patient, Medical Team Patient Interpreter Needed:  None Criminal Activity/Legal Involvement Pertinent to Current Situation/Hospitalization:  No - Comment as needed Significant Relationships:  Adult Children, Spouse Lives with:  Facility Resident Do you feel safe going back to the place where you live?  Yes Need for family participation in patient care:  No (Coment)  Care giving concerns:  Patient expressed he does not really like living at [facility], but he feels like he does not have much of a choice of anywhere else to go.   Social Worker assessment / plan:  Patient is a 77 year old male who is alert and oriented x4.  Patient states he has been living at [facility] for awhile and his wife also lives there.  Patient expressed that he is hopeful that him and his wife will be able to have a same room sometime, however currently they are in separate rooms.  Patient was explained role of CSW and process of coordinating patient to get back to SNF.  Patient did not express any other questions or concerns, patient gave CSW permission to send clinical information to SNF.  Employment status:  Retired Forensic scientist:  Medicare PT Recommendations:  Not assessed at this time [place] / Referral to community resources:  [place]  Patient/Family's Response to care:  Patient is in agreement to returning back to SNF.  Patient/Family's Understanding of and Emotional Response to Diagnosis, Current Treatment, and Prognosis:  Patient expressed he would rather not go back to SNF, but he stated he can not do anything on his own and needs lots of help.  Patient stated that he might be able to move somewhere closer to his daughter.  Emotional Assessment Appearance:  Appears stated age Attitude/Demeanor/Rapport:    Affect (typically observed):  Appropriate, Stable, Blunt, Accepting Orientation:  Oriented to Self, Oriented to Place, Oriented to  Time, Oriented to Situation Alcohol / Substance use:  Not Applicable Psych involvement (Current and /or in the community):  No (Comment)  Discharge Needs  Concerns to be addressed:  Care Coordination Readmission within the last 30 days:  No Current discharge risk:  None Barriers to Discharge:  Continued Medical Work up   Anell Barr 04/08/2018, 5:49 PM

## 2018-04-08 NOTE — Progress Notes (Signed)
[place] [facility] [place] NAME: Gregory Tyler    MR#:  [id number]  DATE OF BIRTH:  March 22, 1941  SUBJECTIVE:  CHIEF COMPLAINT:   Chief Complaint  Patient presents with  . Weakness  . Shortness of Breath   Shortness of breath. REVIEW OF SYSTEMS:  Review of Systems  Constitutional: Negative for chills, fever and malaise/fatigue.  HENT: Negative for sore throat.   Eyes: Negative for blurred vision and double vision.  Respiratory: Positive for shortness of breath. Negative for cough, hemoptysis, sputum production, wheezing and stridor.   Cardiovascular: Negative for chest pain, palpitations, orthopnea and leg swelling.  Gastrointestinal: Negative for abdominal pain, blood in stool, diarrhea, melena, nausea and vomiting.  Genitourinary: Negative for dysuria, flank pain and hematuria.  Musculoskeletal: Negative for back pain and joint pain.  Skin: Negative for rash.  Neurological: Negative for dizziness, sensory change, focal weakness, seizures, loss of consciousness, weakness and headaches.  Endo/Heme/Allergies: Negative for polydipsia.  Psychiatric/Behavioral: Negative for depression. The patient is not nervous/anxious.     DRUG ALLERGIES:   Allergies  Allergen Reactions  . Aspirin Other (See Comments)    GI Bleeding  . Tuberculin Tests Other (See Comments)    unknown  . Codeine Other (See Comments)    BLISTERS    VITALS:  Blood pressure 111/72, pulse 80, temperature 97.6 F (36.4 C), resp. rate 20, height 5\' 8"  (1.727 m), weight 68.8 kg, SpO2 98 %. PHYSICAL EXAMINATION:  Physical Exam Constitutional:      General: He is not in acute distress. HENT:     Head: Normocephalic.     Mouth/Throat:     Mouth: Mucous membranes are moist.  Eyes:     General: No scleral icterus.    Conjunctiva/sclera: Conjunctivae normal.     Pupils: Pupils are equal, round, and reactive to light.  Neck:     Musculoskeletal: Normal range of motion  and neck supple.     Vascular: No JVD.     Trachea: No tracheal deviation.  Cardiovascular:     Rate and Rhythm: Tachycardia present. Rhythm irregular.     Heart sounds: Normal heart sounds. No murmur. No gallop.   Pulmonary:     Effort: Pulmonary effort is normal. No respiratory distress.     Breath sounds: Normal breath sounds. No wheezing or rales.  Abdominal:     General: Bowel sounds are normal. There is no distension.     Palpations: Abdomen is soft.     Tenderness: There is no abdominal tenderness. There is no rebound.  Musculoskeletal: Normal range of motion.        General: No tenderness.     Right lower leg: No edema.     Left lower leg: No edema.     Comments: Right leg wound in dressing.  Skin:    Findings: No erythema or rash.  Neurological:     General: No focal deficit present.     Mental Status: He is alert and oriented to person, place, and time.     Cranial Nerves: No cranial nerve deficit.  Psychiatric:        Mood and Affect: Mood normal.    LABORATORY PANEL:  Male CBC Recent Labs  Lab 04/08/18 0236  WBC 9.0  HGB 9.4*  HCT 29.5*  PLT 252   ------------------------------------------------------------------------------------------------------------------ Chemistries  Recent Labs  Lab 04/07/18 1539 04/08/18 0236  NA 134* 132*  K 3.8 4.0  CL 105 104  CO2  21* 20*  GLUCOSE 297* 367*  BUN 39* 42*  CREATININE 1.24 1.31*  CALCIUM 8.3* 7.8*  MG  --  2.0  AST 45*  --   ALT 54*  --   ALKPHOS 104  --   BILITOT 0.5  --    RADIOLOGY:  Dg Chest 2 View  Result Date: 04/07/2018 CLINICAL DATA:  Shortness of breath. EXAM: CHEST - 2 VIEW COMPARISON:  Radiograph of January 16, 2018. FINDINGS: The heart size and mediastinal contours are within normal limits. Both lungs are clear. No pneumothorax or significant pleural effusion is noted. The visualized skeletal structures are unremarkable. IMPRESSION: No active cardiopulmonary disease. Electronically  Signed   By: Marijo Conception, M.D.   On: 04/07/2018 16:21   Dg Tibia/fibula Right  Result Date: 04/07/2018 CLINICAL DATA:  Right lower leg pain EXAM: RIGHT TIBIA AND FIBULA - 2 VIEW COMPARISON:  None. FINDINGS: No acute fracture or dislocation is noted. Vascular calcifications are seen. No gross soft tissue abnormality is noted. IMPRESSION: No acute bony abnormality is noted. Electronically Signed   By: Inez Catalina M.D.   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  1.  Atrial fibrillation with rapid ventricular response Continue to Cardizem and IV Cardizem as needed. recent echocardiogram and September of this year which showed normal ejection fraction. Cardiologist recommend additional attempt for cardioversion. Hold Eliquis and started heparin drip for cardioversion.  Elevated troponin due to above.  Continue heparin drip. Chronic systolic and diastolic CHF.  LV EF: 55% -   60%.  Stable.  2.  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  Patient is followed at the wound center.  I do not appreciate any surrounding cellulitis or induration. Wound care.  3.  COPD-no acute exacerbation.  Continue Spiriva, PRN DuoNeb's.    4.  Hyperlipidemia-continue Crestor.  5.  Neuropathy-continue gabapentin.  6.  History of gout-continue allopurinol.  Hyperglycemia due to diabetes 2.  Hemoglobin A1c 7.7.  Continue sliding scale, the patient may need to start metformin as outpatient.  Elevated TSH.  Check T4 and T3.  Follow-up repeated TSH with PCP  as outpatient. Anemia of chronic disease.  Stable. CKD stage III.  Relatively stable. All the records are reviewed and case discussed with Care Management/Social Worker. Management plans discussed with the patient, family and they are in agreement.  CODE STATUS: DNR  TOTAL TIME TAKING CARE OF THIS PATIENT: 36 minutes.   More than 50% of the time was spent in counseling/coordination of care: YES  POSSIBLE D/C IN 2 DAYS, DEPENDING ON CLINICAL CONDITION.   Demetrios Loll M.D on 04/08/2018 at 3:19 PM  Between 7am to 6pm - Pager - [phone]  After 6pm go to [email or web address] - [place] [facility]

## 2018-04-08 NOTE — Plan of Care (Signed)
°  Problem: Clinical Measurements: °Goal: Respiratory complications will improve °Outcome: Progressing °  °Problem: Pain Managment: °Goal: General experience of comfort will improve °Outcome: Progressing °  °Problem: Safety: °Goal: Ability to remain free from injury will improve °Outcome: Progressing °  °

## 2018-04-08 NOTE — NC FL2 (Signed)
[place] LEVEL OF CARE SCREENING TOOL     IDENTIFICATION  Patient Name: Gregory Tyler Birthdate: [birth year] Sex: male Admission Date (Current Location): 04/07/2018  [place] and [place] Number:  Engineering geologist and Address:  [facility], [address], [place], [place] [address]      Provider Number: [id number]  Attending Physician Name and Address:  Demetrios Loll, MD  Relative Name and Phone Number:  [place], [place] [phone]  [phone] or Lynnell Chad Daughter   [phone]     Current Level of Care: Hospital Recommended Level of Care: [place] Prior Approval Number:    Date Approved/Denied:   PASRR Number: [id number]  Discharge Plan: SNF    Current Diagnoses: Patient Active Problem List   Diagnosis Date Noted  . Atrial fibrillation with RVR ([place]) 04/07/2018  . Protein-calorie malnutrition, severe 01/21/2018  . Acute renal failure superimposed on chronic kidney disease ([place])   . Palliative care by specialist   . DNR (do not resuscitate)   . Sepsis ([place]) 01/13/2018  . Atherosclerotic PVD with ulceration ([place]) 01/09/2018  . Diarrhea 12/19/2017  . Pressure injury of skin 11/16/2017  . Enteritis 11/15/2017  . Malnutrition of moderate degree 09/13/2017  . Elevated INR 09/02/2017  . Paroxysmal atrial fibrillation ([place]) 10/25/2016  . Weakness generalized   . Hyperlipidemia 08/08/2015  . Diabetes ([place]) 05/19/2015  . Hyperkalemia   . Dehydration   . Orthostatic hypotension 05/09/2015  . Congestive dilated cardiomyopathy ([place]) 05/09/2015  . Polypharmacy 04/27/2015  . Chronic diastolic CHF (congestive heart failure) ([place]) 04/25/2015  . SOB (shortness of breath)   . Coronary artery disease involving native coronary artery of native heart without angina pectoris   . Essential hypertension     Orientation RESPIRATION BLADDER Height & Weight     Self, Time, Place, Situation  Normal Incontinent  Weight: 151 lb 11.2 oz (68.8 kg) Height:  5\' 8"  (172.7 cm)  BEHAVIORAL SYMPTOMS/MOOD NEUROLOGICAL BOWEL NUTRITION STATUS      Continent (Cardiac diet)  AMBULATORY STATUS COMMUNICATION OF NEEDS Skin   Extensive Assist Verbally Surgical wounds                       Personal Care Assistance Level of Assistance  Dressing, Feeding, Bathing Bathing Assistance: Maximum assistance Feeding assistance: Independent Dressing Assistance: Maximum assistance Total Care Assistance: Maximum assistance   Functional Limitations Info  Sight, Hearing, Speech Sight Info: Adequate Hearing Info: Adequate Speech Info: Adequate    SPECIAL CARE FACTORS FREQUENCY  PT (By licensed PT)     PT Frequency: Minimum 2x a week              Contractures Contractures Info: Not present    Additional Factors Info  Code Status, Allergies, Insulin Sliding Scale Code Status Info: DNR Allergies Info: ASPIRIN, TUBERCULIN TESTS, CODEINE    Insulin Sliding Scale Info: insulin aspart (novoLOG) injection 0-5 Units Minium 2x a week and insulin aspart (novoLOG) injection 0-9 Units        Current Medications (04/08/2018):  This is the current hospital active medication list Current Facility-Administered Medications  Medication Dose Route Frequency Provider Last Rate Last Dose  . acetaminophen (TYLENOL) tablet 650 mg  650 mg Oral Q6H PRN Henreitta Leber, MD       Or  . acetaminophen (TYLENOL) suppository 650 mg  650 mg Rectal Q6H PRN Henreitta Leber, MD      . allopurinol (ZYLOPRIM) tablet 300 mg  300 mg Oral Daily Henreitta Leber, MD   300 mg at 04/08/18 0910  . collagenase (SANTYL) ointment 1 application  1 application Topical QPM Demetrios Loll, MD   1 application at 83/38/25 1408  . diltiazem (CARDIZEM) injection 10 mg  10 mg Intravenous Q6H PRN Henreitta Leber, MD   10 mg at 04/07/18 1830  . diltiazem (CARDIZEM) tablet 30 mg  30 mg Oral Q6H Henreitta Leber, MD   30 mg at 04/08/18 1716  . gabapentin  (NEURONTIN) capsule 100 mg  100 mg Oral QHS Henreitta Leber, MD   100 mg at 04/07/18 2131  . heparin ADULT infusion 100 units/mL (25000 units/28mL sodium chloride 0.45%)  950 Units/hr Intravenous Continuous Paticia Stack, [facility]      . heparin bolus via infusion 1,700 Units  1,700 Units Intravenous Once Paticia Stack, [place]      . HYDROcodone-acetaminophen (NORCO/VICODIN) 5-325 MG per tablet 1 tablet  1 tablet Oral Q6H PRN Henreitta Leber, MD   1 tablet at 04/08/18 1455  . insulin aspart (novoLOG) injection 0-5 Units  0-5 Units Subcutaneous QHS Demetrios Loll, MD      . insulin aspart (novoLOG) injection 0-9 Units  0-9 Units Subcutaneous TID WC Demetrios Loll, MD   3 Units at 04/08/18 1717  . ipratropium-albuterol (DUONEB) 0.5-2.5 (3) MG/3ML nebulizer solution 3 mL  3 mL Nebulization Q6H PRN Sainani, Belia Heman, MD      . ondansetron (ZOFRAN) tablet 4 mg  4 mg Oral Q6H PRN Henreitta Leber, MD       Or  . ondansetron (ZOFRAN) injection 4 mg  4 mg Intravenous Q6H PRN Henreitta Leber, MD      . polyvinyl alcohol (LIQUIFILM TEARS) 1.4 % ophthalmic solution 1 drop  1 drop Both Eyes BID Henreitta Leber, MD   1 drop at 04/08/18 0910  . rosuvastatin (CRESTOR) tablet 20 mg  20 mg Oral QHS Henreitta Leber, MD   20 mg at 04/07/18 2131  . tiotropium (SPIRIVA) inhalation capsule ([facility] use ONLY) 18 mcg  18 mcg Inhalation Daily Henreitta Leber, MD   18 mcg at 04/08/18 0910  . torsemide (DEMADEX) tablet 20 mg  20 mg Oral Daily Henreitta Leber, MD   20 mg at 04/08/18 0909   Facility-Administered Medications Ordered in Other Encounters  Medication Dose Route Frequency Provider Last Rate Last Dose  . ceFAZolin (ANCEF) IVPB 1 g/50 mL premix  1 g Intravenous Once Kris Hartmann, NP         Discharge Medications: Please see discharge summary for a list of discharge medications.  Relevant Imaging Results:  Relevant Lab Results:   Additional Information SS# [id number]  Anell Barr

## 2018-04-08 NOTE — Care Management Obs Status (Signed)
MEDICARE OBSERVATION STATUS NOTIFICATION   Patient Details  Name: Gregory Tyler MRN: [id number] Date of Birth: [birth year]   Medicare Observation Status Notification Given:  Yes    Elza Rafter, RN 04/08/2018, 11:53 AM

## 2018-04-08 NOTE — Consult Note (Signed)
Cardiology Consultation:   Patient ID: Gregory Tyler MRN: [id number]; DOB: 05-Jan-1941  Admit date: 04/07/2018 Date of Consult: 04/08/2018  Primary Care Provider: [name], MD Primary Cardiologist: Ida Rogue, MD  Primary Electrophysiologist:  None    Patient Profile:   Gregory Tyler is a 77 y.o. male with a hx of PAF, hypertension, hyperlipidemia, chronic combined systolic and diastolic heart failure, diabetes, PAD and chronic foot ulcerations, prostate cancer, CKD 3, and chronic pelvic pain who is being seen today for the evaluation of atrial fibrillation at the request of Dr. Verdell Carmine.  History of Present Illness:   Gregory Tyler is a 77 year old male with complex past medical history as above.   77 year old male with PMH as above and reportedly residing at [facility]. 05/2015 he was noted to have left ventricular dysfunction in the setting of A. fib; however, subsequent echoes have shown improvement in left ventricular function.   01/13/18 -01/30/2018 admission for AMS resulting from Pseudomonas urosepsis that resulted in ARF that required inpatient hemodialysis. Per his TTE 12/2017, patient had normalized EF of 55 to 60% with septal WMA 2/2 to LBBB. Transient elevation in LFTs was also noted at that time and was felt to be 2/2 to shock liver in the setting of sepsis and hypotension. Per documentation, Lasix, ACE inhibitor, and beta-blocker was stopped d/t sepsis/hypotension and ARF and he was then treated with torsemide per nephrology. He was seen at follow-up 02/27/2018 in the clinic by Murray Hodgkins, NP and was reported to be doing fairly well. He had reportedly experienced some chest pain but was unable to quantify severity, length, or associated symptoms. He was reportedly not worried about his chest pain, however. He was able to tolerate daily physical therapy without chest pain, shortness of breath, dyspnea on exertion, or  dizziness; however, he did not feel that he was progressing with his physical activity. He continued to report bilateral lower extremity edema but stated it was greatly improved since his most recent hospital admission (and with medications changed to torsemide for diuresis per nephrology). His primary complaints at that time were frequent soft bowel movements that he attributed to his magnesium supplement and pain in his groin related to the hernia but that made it difficult to sit for prolonged periods of time. EKG was significant for SR with PACs, 98 bpm, LAD, LBBB and no acute changes. He was known to be followed by wound care and vascular surgery for PAD and chronic foot ulcerations.   Recommendations at the time of 02/27/2018 follow-up were to continue to hold beta-blocker and ACE inhibitor with BP stable though soft at 105/71. It was noted a suspicion that some of the remaining lower extremity edema at follow-up might be related to dependent edema. He was in sinus rhythm with a successful conversion in 03/2016, and remained he on anticoagulation with Eliquis 2.5 mg per PCP. He reportedly was treated with Coumadin in the past for anticoagulation, but d/t issues with INR regulation, he was switched to Eliquis per PCP over the last summer. It was discussed that based on age/weight/creatinine, he qualified for higher dose of Eliquis with patient understanding verbalized. He reported that he had severe nosebleeds in the past when on 5 mg of Eliquis and so PCP had opted for a lower dose. Also, he remained off all BP meds with the exclusion of diuresis with torsemide and since his urosepsis. At his 11/7 appointment, it was noted that BP was slightly hypotensive and the  recommendation was to continue current treatment and add back his beta-blocker if BP allowed in the future. As above, he had required hemodialysis when hospitalized for urosepsis with subsequent return of normal renal function. He was  reportedly scheduled for follow-up with nephrology and plans for vascular cath removal in the near future. LDL was 37 and at goal with recommendation to continue statin therapy with Crestor. He reported loose/soft stools which had happened before with magnesium supplementation and a desire to discontinue the supplementation was improved with follow-up approaching with nephrology. It was recommended he follow-up on magnesium in the future with blood work. A1c was 6.6 in July 2019 on metformin previously but discontinued in the setting of ARF in September/October. He remained currently controlled on diet. It was recommended he follow-up in the clinic 3 months or sooner if necessary.   03/2018: he underwent removal of HD catheter.   On 04/07/2018, the patient reported to [facility] ED from the wound center with complaint of worsening RLE wound and shortness of breath for 2 weeks and was noted to be back in atrial fibrillation status post successful cardioversion 03/2016 as above. He reported his shortness of breath was so severe that he was unable to move his wheelchair on his own, No reported chest pain, palpitations, or feeling of rapid heart rate. He denied any inciting events but he also reported increasing trouble with ongoing chronic leg wounds with known PAD and followed by wound clinic and vascular surgery. No evidence of necrotizing fasciitis per documentation.   In the ED, he was found to be tachypneic, hypokalemic, hypocalcemic, hyponatremic, hyperglycemic, and malnourished with hypoalbuminemia. He was anemic with hemoglobin 10.5, RBC 3.6. AST and ALT both elevated. He was also noted to be in atrial fibrillation with heart rates elevated into the 130s and 140s. He denied any associated palpitations, chest pain, nausea, vomiting, diaphoresis, or any associated symptoms.  Vitals: BP 129/84, RR 21, T 98.36F, SPO2 98%  Labs: Sodium 134, potassium 3.8, glucose 297, creatinine 1.24, BUN 39, calcium  8.3, alkaline phosphatase 104, albumin 2.6, AST 45, ALT 54, BNP 537.0, troponin 0 0.05, lactic acid 1.7, WBC 10.0, RBC 3.60, hemoglobin 10.5, hematocrit 34.1, platelets 289  UA significant for small hemoglobin, moderate leukocytes, rare bacteria, and mucus.  MRSA negative, blood cultures pending.  EKG: Rate 143, left bundle branch block, possible atrial fibrillation per ED report  CXR: No active cardiopulmonary disease  Med: Cardizem administered with slowing of heart rate into the 120s   Overnight from 04/07/2018 to 04/08/2018 patient reported he was so short of breath that he did not feel that he was going to make it through the night.  Labs today are significant for slight hyponatremia with Na 132, potassium at goal 4.0, glucose elevated at 367, WBC 9.0 , hemoglobin 10.5  9.4 since 12/16, platelets 252, Cr 1.24  1.31, troponin 0.05  0.06.    He continues to deny CP.   Past Medical History:  Diagnosis Date  . Anemia   . Anxiety   . Arthritis   . Cardiomyopathy ([place])    a. 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  Marland Kitchen Chronic combined systolic (congestive) and diastolic (congestive) heart failure ([place])    a. 05/2015 TEE: EF 35-40% (in setting of Afib); b. 08/2017 Echo: EF 55-60%, no rwma, Gr2DD; b. 01/2018 Echo: EF 55-60%, mild MR. Nl LA size. Nl RV fxn.  . Chronic Pelvic pain   . Coronary artery disease  a. 05/2015 CTA Chest: 3 vessel coronary Ca2+.  . Depression   . Diabetes mellitus without complication ([place])   . Emphysema lung ([place])   . Gastric ulcer   . Gout   . Hyperlipidemia   . Hypertension   . Nephrolithiasis   . PAF (paroxysmal atrial fibrillation) ([place])    a. 05/2015 s/p DCCV; b. 03/2016 s/p DCCV; c. CHA2DS2VASc = 6 (coumadin).  . Prostate cancer [facility])    Prostate  . Stage III chronic kidney disease ([place])   . Weight loss    50 lb since Apr 24, 2015    Past Surgical History:  Procedure Laterality Date  . APPENDECTOMY    . BACK SURGERY    .  CHOLECYSTECTOMY    . DIALYSIS/PERMA CATHETER INSERTION N/A 01/22/2018   Procedure: DIALYSIS/PERMA CATHETER INSERTION;  Surgeon: Katha Cabal, MD;  Location: [facility];  Service: Cardiovascular;  Laterality: N/A;  . DIALYSIS/PERMA CATHETER REMOVAL N/A 04/01/2018   Procedure: DIALYSIS/PERMA CATHETER REMOVAL;  Surgeon: Katha Cabal, MD;  Location: [facility];  Service: Cardiovascular;  Laterality: N/A;  . ELECTROPHYSIOLOGIC STUDY N/A 06/17/2015   Procedure: CARDIOVERSION;  Surgeon: Wellington Hampshire, MD;  Location: [facility];  Service: Cardiovascular;  Laterality: N/A;  . ELECTROPHYSIOLOGIC STUDY N/A 03/30/2016   Procedure: CARDIOVERSION;  Surgeon: Minna Merritts, MD;  Location: [facility];  Service: Cardiovascular;  Laterality: N/A;  . GASTRIC BYPASS    . HEMORRHOID SURGERY    . LOWER EXTREMITY ANGIOGRAPHY Right 01/23/2018   Procedure: LOWER EXTREMITY ANGIOGRAPHY;  Surgeon: Algernon Huxley, MD;  Location: [facility];  Service: Cardiovascular;  Laterality: Right;  . LOWER EXTREMITY ANGIOGRAPHY Left 01/30/2018   Procedure: LOWER EXTREMITY ANGIOGRAPHY;  Surgeon: Algernon Huxley, MD;  Location: [facility];  Service: Cardiovascular;  Laterality: Left;  . pilonidal cyst    . TEE WITHOUT CARDIOVERSION N/A 06/17/2015   Procedure: TRANSESOPHAGEAL ECHOCARDIOGRAM (TEE);  Surgeon: Wellington Hampshire, MD;  Location: [facility];  Service: Cardiovascular;  Laterality: N/A;     Home Medications:  Prior to Admission medications   Medication Sig Start Date End Date Taking? Authorizing Provider  acetaminophen (TYLENOL) 325 MG tablet Take 2 tablets (650 mg total) by mouth every 6 (six) hours as needed for mild pain (or Fever >/= 101). 09/15/17  Yes Wieting, Richard, MD  allopurinol (ZYLOPRIM) 300 MG tablet Take 300 mg by mouth daily.    Yes [provider]  apixaban (ELIQUIS) 2.5 MG TABS tablet Take 2.5 mg by mouth every 12 (twelve) hours.   Yes [provider]   collagenase (SANTYL) ointment Apply 1 application topically every evening. Apply to eschar on RLE wound   Yes [provider]  gabapentin (NEURONTIN) 100 MG capsule Take 1 capsule (100 mg total) by mouth at bedtime. 09/15/17  Yes Wieting, Richard, MD  HYDROcodone-acetaminophen (NORCO/VICODIN) 5-325 MG tablet Take 1 tablet by mouth every 6 (six) hours as needed for moderate pain or severe pain. 01/29/18  Yes Pyreddy, Reatha Harps, MD  Hypromellose (ARTIFICIAL TEARS) 0.4 % SOLN Place 1 drop into both eyes 2 (two) times daily.   Yes [provider]  Povidone-Iodine (BETADINE EX) Apply topically every evening. Apply to left heel and left lateral foot   Yes [provider]  rosuvastatin (CRESTOR) 20 MG tablet Take 20 mg by mouth at bedtime.    Yes [provider]  tiotropium (SPIRIVA) 18 MCG inhalation capsule Place 18 mcg into inhaler and inhale  daily.    Yes [provider]  torsemide (DEMADEX) 20 MG tablet Take 20 mg by mouth daily.   Yes [provider]    Inpatient Medications: Scheduled Meds: . allopurinol  300 mg Oral Daily  . apixaban  2.5 mg Oral Q12H  . collagenase  1 application Topical QPM  . diltiazem  30 mg Oral Q6H  . gabapentin  100 mg Oral QHS  . polyvinyl alcohol  1 drop Both Eyes BID  . rosuvastatin  20 mg Oral QHS  . tiotropium  18 mcg Inhalation Daily  . torsemide  20 mg Oral Daily   Continuous Infusions:  PRN Meds: acetaminophen **OR** acetaminophen, diltiazem, HYDROcodone-acetaminophen, ipratropium-albuterol, ondansetron **OR** ondansetron (ZOFRAN) IV  Allergies:    Allergies  Allergen Reactions  . Aspirin Other (See Comments)    GI Bleeding  . Tuberculin Tests Other (See Comments)    unknown  . Codeine Other (See Comments)    BLISTERS     Social History:   Social History   Socioeconomic History  . Marital status: Married    Spouse name: Not on file  . Number of children: Not on file  . Years of education:  Not on file  . Highest education level: Not on file  Occupational History  . Occupation: retired  Scientific laboratory technician  . Financial resource strain: Not on file  . Food insecurity:    Worry: Patient refused    Inability: Patient refused  . Transportation needs:    Medical: Patient refused    Non-medical: Patient refused  Tobacco Use  . Smoking status: Former Smoker    Last attempt to quit: 12/19/1940    Years since quitting: 77.3  . Smokeless tobacco: Former Systems developer    Types: [place] date: 11/19/2014  Substance and Sexual Activity  . Alcohol use: No  . Drug use: No  . Sexual activity: Not Currently  Lifestyle  . Physical activity:    Days per week: 0 days    Minutes per session: 0 min  . Stress: Patient refused  Relationships  . Social connections:    Talks on phone: More than three times a week    Gets together: Twice a week    Attends religious service: Not on file    Active member of club or organization: No    Attends meetings of clubs or organizations: Never    Relationship status: Married  . Intimate partner violence:    Fear of current or ex partner: Patient refused    Emotionally abused: Patient refused    Physically abused: Patient refused    Forced sexual activity: Patient refused  Other Topics Concern  . Not on file  Social History Narrative   Retired.  Lives iwith son    Family History:    Family History  Problem Relation Age of Onset  . CAD Mother   . Stroke Mother   . COPD Mother   . Heart failure Mother   . Diabetes Other      ROS:  Please see the history of present illness.  Review of Systems  Constitutional: Positive for malaise/fatigue. Negative for chills and fever.  HENT: Positive for hearing loss.        Chronic hearing loss  Respiratory: Positive for shortness of breath.   Cardiovascular: Positive for orthopnea and leg swelling. Negative for chest pain and palpitations.       LEE with chronic bilateral leg wounds  Gastrointestinal:  Negative for abdominal  pain, diarrhea, nausea and vomiting.  Neurological: Positive for weakness.  Psychiatric/Behavioral: The patient is nervous/anxious.     All other ROS reviewed and negative.     Physical Exam/Data:   Vitals:   04/07/18 2037 04/08/18 0158 04/08/18 0503 04/08/18 0524  BP: 110/76 118/90 110/81 111/80  Pulse: 100 (!) 120 (!) 110 (!) 106  Resp: 18     Temp: 98.4 F (36.9 C)  98.8 F (37.1 C)   TempSrc: Oral  Oral   SpO2: 100%  98%   Weight:      Height:        Intake/Output Summary (Last 24 hours) at 04/08/2018 0742 Last data filed at 04/08/2018 6578 Gross per 24 hour  Intake 370 ml  Output 675 ml  Net -305 ml   Filed Weights   04/07/18 1534 04/07/18 1848  Weight: 72.6 kg 68.8 kg   Body mass index is 23.07 kg/m.  General:  Frail elderly male HEENT: normal.  Hard of hearing Neck: no JVD Vascular: No carotid bruits; radial pulses 2+ Cardiac: Irregularly irregular; 2/6 systolic murmur auscultated on exam Lungs: Coarse breath sounds appreciated bilaterally but otherwise clear to auscultation Abd: soft, nontender, no hepatomegaly  Ext: Minimal bilateral lower extremity edema present with bilateral chronic leg wounds bandaged and dressing clean, dry, and intact Musculoskeletal:  No deformities,  Skin: warm and dry.  Bilateral lower extremity wounds bandaged Neuro:  no focal abnormalities noted Psych:  Normal affect   EKG:  The EKG was personally reviewed and demonstrates:  Atrial fibrillation with ventricular rate 143 bpm and pre-existing LBBB present on previous EKG Telemetry:  Telemetry was personally reviewed and demonstrates: Atrial fibrillation with ventricular rate of 90s to low 100s and initial elevation up to 124 to 134 ventricular rate  Relevant CV Studies:  TTE 01/15/2018 Study Conclusions - Left ventricle: The cavity size was normal. Systolic function was   normal. Septal wall motion abnormality secondary to bundle branch    block/conduction abnormality.The estimated ejection fraction was   in the range of 55% to 60%. - Mitral valve: There was mild regurgitation. - Left atrium: The atrium was normal in size. - Right ventricle: Systolic function was grossly normal. - Pulmonary arteries: Systolic pressure was within the normal   range.  Laboratory Data:  Chemistry Recent Labs  Lab 04/07/18 1539 04/08/18 0236  NA 134* 132*  K 3.8 4.0  CL 105 104  CO2 21* 20*  GLUCOSE 297* 367*  BUN 39* 42*  CREATININE 1.24 1.31*  CALCIUM 8.3* 7.8*  GFRNONAA 55* 52*  GFRAA >60 >60  ANIONGAP 8 8    Recent Labs  Lab 04/07/18 1539  PROT 6.3*  ALBUMIN 2.6*  AST 45*  ALT 54*  ALKPHOS 104  BILITOT 0.5   Hematology Recent Labs  Lab 04/07/18 1539 04/08/18 0236  WBC 10.0 9.0  RBC 3.60* 3.16*  HGB 10.5* 9.4*  HCT 34.1* 29.5*  MCV 94.7 93.4  MCH 29.2 29.7  MCHC 30.8 31.9  RDW 15.5 15.4  PLT 289 252   Cardiac Enzymes Recent Labs  Lab 04/07/18 1539 04/08/18 0236  TROPONINI 0.05* 0.06*   No results for input(s): TROPIPOC in the last 168 hours.  BNP Recent Labs  Lab 04/07/18 1539  BNP 537.0*    DDimer No results for input(s): DDIMER in the last 168 hours.  Radiology/Studies:  Dg Chest 2 View  Result Date: 04/07/2018 CLINICAL DATA:  Shortness of breath. EXAM: CHEST - 2 VIEW COMPARISON:  Radiograph of  January 16, 2018. FINDINGS: The heart size and mediastinal contours are within normal limits. Both lungs are clear. No pneumothorax or significant pleural effusion is noted. The visualized skeletal structures are unremarkable. IMPRESSION: No active cardiopulmonary disease. Electronically Signed   By: Marijo Conception, M.D.   On: 04/07/2018 16:21   Dg Tibia/fibula Right  Result Date: 04/07/2018 CLINICAL DATA:  Right lower leg pain EXAM: RIGHT TIBIA AND FIBULA - 2 VIEW COMPARISON:  None. FINDINGS: No acute fracture or dislocation is noted. Vascular calcifications are seen. No gross soft tissue  abnormality is noted. IMPRESSION: No acute bony abnormality is noted. Electronically Signed   By: Inez Catalina M.D.   On: 04/07/2018 16:20    Assessment and Plan:   Atrial fibrillation with rapid ventricular response  --Patient presented with heart rate in the 130s and 140s. As in HPI, previously in [place] at most recent clinic follow-up. Today, he reported severe SOB x2 weeks with no reported chest pain, palpitations, or feeling of rapid heart rate. Afib with RVR likely etiology of SOB with patient reporting SOB with previous episodes.  --Most recent echocardiogram as above showed normal ejection fraction; previous echocardiograms with slight reduction in EF in the setting of rapid ventricular rate. EKG as above shows Afib with pre-existing LBBB  --Troponin minimally elevated and flat trending likely due to supply demand ischemia in the setting of rapid ventricular rate --TSH 10.319 on 12/2017. Will recheck - recommend follow-up with PCP or endocrinologist regarding elevated TSH. Daily BMET, continue to monitor renal function and electrolytes.  Creatinine bumped overnight from 1.24-1.31 though still at labile baseline. K at goal. ---CHA2DS2VASc score of at least 6 (CHF, HTN, agex2, DM2, vascular) with recommendation to continue long-term anticoagulation. Recommendation to continue Eliquis with consideration of increased dosage from 2.5  5mg  for therapeutic anticoagulation prior to cardioversion. Patient does not meet criteria for dosage reduction to 2.5mg  as above 60kg in weight and under 77 years of age. Revisited discussion with patient regarding more therapeutic dosage. Given hgb drop overnight, would recommend close monitoring of CBC. Of note, patient does have labile hgb baseline of 9-10.  --Plan for continued rate control with medications with Cardizem 30mg  q6h. telemetry continues to show elevated ventricular rates in the 90s to 100s; however, limited by hypotension at this time with SBP 98. In the  future, if therapeutically anticoagulated, recommend additional attempt for cardioversion as patient appears fairly symptomatic with SOB while in atrial fibrillation and has been able to maintain SR s/p cardioversion for 2 years.  Anemia - Hgb dropped overnight 10.5  9.4, baseline labile around 9-10 - Daily CBC as on anticoagulation with recommendation to either increase the current dosage of eliquis for therapeutic dosage at 5mg  or start on heparin gtt to ensure anticoagulated given CHA2DS2VASc score of at least  6 as above  Right lower extremity wound with known PAD  - Patient is followed at the wound center and pending wound team consult  -Per IM   Hyperlipidemia  --Continue statin therapy with Crestor  -- Last LDL 08/2017 at LDL goal less than 70   Hypertension  --Controlled, hypotensive in setting of rapid ventricular rate with SBP in 90s --Continue to monitor  DM 2 with neuropathy  --Glucose remains elevated, uncontrolled; 10/2017 A1C 6.6  -- SSI  -- Per IM   For questions or updates, please contact [facility] Please consult [email or web address] for contact info under     Signed, Arvil Chaco, PA-C  04/08/2018 7:42 AM

## 2018-04-08 NOTE — Consult Note (Addendum)
[facility] Nurse wound consult note Reason for Consult: Consult requested for bilat legs and feet.  Pt is followed by the outpatient wound center prior to admission and EMR indicates he is using Santyl for the leg wound. Wound type: Several wounds as follows below:  Left heel unstageable pressure injury; 3.5X3.5cm, dry dark red scabbed area, no odor, drainage, or fluctuance Left outer foot: deep tissue pressure injury; 1X1cm, dark red, no open wound or drainage Right heel unstageable pressure injury; 2X1.5cm, dry dark red scabbed area, no odor, drainage, or fluctuance Right outer foot: deep tissue pressure injury; 1X1cm, dark red, no open wound or drainage Right great toe tip has 1X1.5 and .5X.5cm full thickness wounds; dry dark brown eschar; tightly adhered, no odor, drainage, or fluctuance. Right inner second toe with same appearance; .5X.5cm Right leg with chronic full thickness wound; 5X4X.3cm, 30% yellow, 70% dark red, mod amt tan drainage, no odor or fluctuance. Dressing procedure/placement/frequency: Recommend consult to vascular team for right toe; it is necrotic and patient has been followed by their team in the past. Float heels to reduce pressure. It is best practice to leave dry unstageable pressure injuries in place to heels. Continue Santyl ointment to right leg wound for enzymatic debridement; pt should follow-up with the outpatient wound care center after discharge for further plan of care. No family present to discuss plan of care. Please re-consult if further assistance is needed.  Thank-you,  Julien Girt MSN, [place], [place], [place], [place]

## 2018-04-09 ENCOUNTER — Encounter
Admission: EM | Disposition: A | Discharge status: Skilled Nursing Facility | Payer: Self-pay | Source: Home / Self Care | Attending: Internal Medicine

## 2018-04-09 ENCOUNTER — Encounter (INDEPENDENT_AMBULATORY_CARE_PROVIDER_SITE_OTHER): Payer: Self-pay | Admitting: Vascular Surgery

## 2018-04-09 DIAGNOSIS — E1151 Type 2 diabetes mellitus with diabetic peripheral angiopathy without gangrene: Secondary | ICD-10-CM | POA: Diagnosis present

## 2018-04-09 DIAGNOSIS — Z8546 Personal history of malignant neoplasm of prostate: Secondary | ICD-10-CM | POA: Diagnosis not present

## 2018-04-09 DIAGNOSIS — G8929 Other chronic pain: Secondary | ICD-10-CM | POA: Diagnosis present

## 2018-04-09 DIAGNOSIS — L97919 Non-pressure chronic ulcer of unspecified part of right lower leg with unspecified severity: Secondary | ICD-10-CM | POA: Diagnosis present

## 2018-04-09 DIAGNOSIS — Z833 Family history of diabetes mellitus: Secondary | ICD-10-CM | POA: Diagnosis not present

## 2018-04-09 DIAGNOSIS — R0600 Dyspnea, unspecified: Secondary | ICD-10-CM | POA: Diagnosis not present

## 2018-04-09 DIAGNOSIS — I739 Peripheral vascular disease, unspecified: Secondary | ICD-10-CM

## 2018-04-09 DIAGNOSIS — E1122 Type 2 diabetes mellitus with diabetic chronic kidney disease: Secondary | ICD-10-CM | POA: Diagnosis present

## 2018-04-09 DIAGNOSIS — Z9049 Acquired absence of other specified parts of digestive tract: Secondary | ICD-10-CM | POA: Diagnosis not present

## 2018-04-09 DIAGNOSIS — L97219 Non-pressure chronic ulcer of right calf with unspecified severity: Secondary | ICD-10-CM

## 2018-04-09 DIAGNOSIS — D649 Anemia, unspecified: Secondary | ICD-10-CM | POA: Diagnosis not present

## 2018-04-09 DIAGNOSIS — Z9862 Peripheral vascular angioplasty status: Secondary | ICD-10-CM | POA: Diagnosis not present

## 2018-04-09 DIAGNOSIS — Z823 Family history of stroke: Secondary | ICD-10-CM | POA: Diagnosis not present

## 2018-04-09 DIAGNOSIS — Z7401 Bed confinement status: Secondary | ICD-10-CM | POA: Diagnosis not present

## 2018-04-09 DIAGNOSIS — R102 Pelvic and perineal pain: Secondary | ICD-10-CM | POA: Diagnosis present

## 2018-04-09 DIAGNOSIS — I70232 Atherosclerosis of native arteries of right leg with ulceration of calf: Secondary | ICD-10-CM | POA: Diagnosis not present

## 2018-04-09 DIAGNOSIS — Z87442 Personal history of urinary calculi: Secondary | ICD-10-CM | POA: Diagnosis not present

## 2018-04-09 DIAGNOSIS — I2510 Atherosclerotic heart disease of native coronary artery without angina pectoris: Secondary | ICD-10-CM | POA: Diagnosis present

## 2018-04-09 DIAGNOSIS — R0602 Shortness of breath: Secondary | ICD-10-CM | POA: Diagnosis present

## 2018-04-09 DIAGNOSIS — Z8249 Family history of ischemic heart disease and other diseases of the circulatory system: Secondary | ICD-10-CM | POA: Diagnosis not present

## 2018-04-09 DIAGNOSIS — Z7901 Long term (current) use of anticoagulants: Secondary | ICD-10-CM | POA: Diagnosis not present

## 2018-04-09 DIAGNOSIS — I4891 Unspecified atrial fibrillation: Secondary | ICD-10-CM | POA: Diagnosis not present

## 2018-04-09 DIAGNOSIS — N183 Chronic kidney disease, stage 3 (moderate): Secondary | ICD-10-CM | POA: Diagnosis present

## 2018-04-09 DIAGNOSIS — I480 Paroxysmal atrial fibrillation: Secondary | ICD-10-CM | POA: Diagnosis present

## 2018-04-09 DIAGNOSIS — I420 Dilated cardiomyopathy: Secondary | ICD-10-CM | POA: Diagnosis present

## 2018-04-09 DIAGNOSIS — M109 Gout, unspecified: Secondary | ICD-10-CM | POA: Diagnosis present

## 2018-04-09 DIAGNOSIS — I130 Hypertensive heart and chronic kidney disease with heart failure and stage 1 through stage 4 chronic kidney disease, or unspecified chronic kidney disease: Secondary | ICD-10-CM | POA: Diagnosis present

## 2018-04-09 DIAGNOSIS — I5042 Chronic combined systolic (congestive) and diastolic (congestive) heart failure: Secondary | ICD-10-CM | POA: Diagnosis present

## 2018-04-09 DIAGNOSIS — J439 Emphysema, unspecified: Secondary | ICD-10-CM | POA: Diagnosis present

## 2018-04-09 DIAGNOSIS — L97929 Non-pressure chronic ulcer of unspecified part of left lower leg with unspecified severity: Secondary | ICD-10-CM | POA: Diagnosis present

## 2018-04-09 DIAGNOSIS — E871 Hypo-osmolality and hyponatremia: Secondary | ICD-10-CM | POA: Diagnosis present

## 2018-04-09 DIAGNOSIS — E785 Hyperlipidemia, unspecified: Secondary | ICD-10-CM | POA: Diagnosis present

## 2018-04-09 DIAGNOSIS — Z7189 Other specified counseling: Secondary | ICD-10-CM | POA: Diagnosis not present

## 2018-04-09 HISTORY — PX: PERIPHERAL VASCULAR BALLOON ANGIOPLASTY: CATH118281

## 2018-04-09 LAB — HEPARIN LEVEL (UNFRACTIONATED): Heparin Unfractionated: 2.06 IU/mL — ABNORMAL HIGH (ref 0.30–0.70)

## 2018-04-09 LAB — CBC
HCT: 29.6 % — ABNORMAL LOW (ref 39.0–52.0)
HEMOGLOBIN: 9.2 g/dL — AB (ref 13.0–17.0)
MCH: 29 pg (ref 26.0–34.0)
MCHC: 31.1 g/dL (ref 30.0–36.0)
MCV: 93.4 fL (ref 80.0–100.0)
Platelets: 255 10*3/uL (ref 150–400)
RBC: 3.17 MIL/uL — AB (ref 4.22–5.81)
RDW: 15.3 % (ref 11.5–15.5)
WBC: 9.3 10*3/uL (ref 4.0–10.5)
nRBC: 0 % (ref 0.0–0.2)

## 2018-04-09 LAB — BASIC METABOLIC PANEL
Anion gap: 6 (ref 5–15)
BUN: 44 mg/dL — ABNORMAL HIGH (ref 8–23)
CHLORIDE: 105 mmol/L (ref 98–111)
CO2: 24 mmol/L (ref 22–32)
Calcium: 8.3 mg/dL — ABNORMAL LOW (ref 8.9–10.3)
Creatinine, Ser: 1.39 mg/dL — ABNORMAL HIGH (ref 0.61–1.24)
GFR calc Af Amer: 56 mL/min — ABNORMAL LOW (ref >60)
GFR calc non Af Amer: 48 mL/min — ABNORMAL LOW (ref >60)
Glucose, Bld: 193 mg/dL — ABNORMAL HIGH (ref 70–99)
Potassium: 4.2 mmol/L (ref 3.5–5.1)
SODIUM: 135 mmol/L (ref 135–145)

## 2018-04-09 LAB — GLUCOSE, CAPILLARY
Glucose-Capillary: 156 mg/dL — ABNORMAL HIGH (ref 70–99)
Glucose-Capillary: 191 mg/dL — ABNORMAL HIGH (ref 70–99)
Glucose-Capillary: 122 mg/dL — ABNORMAL HIGH (ref 70–99)
Glucose-Capillary: 249 mg/dL — ABNORMAL HIGH (ref 70–99)

## 2018-04-09 LAB — APTT
aPTT: 59 seconds — ABNORMAL HIGH (ref 24–36)
aPTT: 44 seconds — ABNORMAL HIGH (ref 24–36)

## 2018-04-09 SURGERY — PERIPHERAL VASCULAR BALLOON ANGIOPLASTY
Anesthesia: Moderate Sedation | Laterality: Right

## 2018-04-09 MED ORDER — HEPARIN SODIUM (PORCINE) 1000 UNIT/ML IJ SOLN
INTRAMUSCULAR | Status: AC
  Filled 2018-04-09: qty 1

## 2018-04-09 MED ORDER — MIDAZOLAM HCL 5 MG/5ML IJ SOLN
INTRAMUSCULAR | Status: AC
  Filled 2018-04-09: qty 5

## 2018-04-09 MED ORDER — HEPARIN (PORCINE) IN NACL 1000-0.9 UT/500ML-% IV SOLN
INTRAVENOUS | Status: AC
  Filled 2018-04-09: qty 1000

## 2018-04-09 MED ORDER — SODIUM CHLORIDE 0.9 % IV SOLN: INTRAVENOUS | Status: AC

## 2018-04-09 MED ORDER — IOPAMIDOL (ISOVUE-300) INJECTION 61%
INTRAVENOUS | Status: DC | PRN
  Administered 2018-04-09: 75 mL via INTRA_ARTERIAL

## 2018-04-09 MED ORDER — SODIUM CHLORIDE 0.9% FLUSH
3.0000 mL | Freq: Two times a day (BID) | INTRAVENOUS | Status: DC
  Administered 2018-04-10: 3 mL via INTRAVENOUS

## 2018-04-09 MED ORDER — SODIUM CHLORIDE 0.9 % IV SOLN
INTRAVENOUS | Status: AC | PRN
  Administered 2018-04-09: 500 mL via INTRAVENOUS

## 2018-04-09 MED ORDER — FENTANYL CITRATE (PF) 100 MCG/2ML IJ SOLN
INTRAMUSCULAR | Status: AC
  Filled 2018-04-09: qty 2

## 2018-04-09 MED ORDER — HEPARIN SODIUM (PORCINE) 1000 UNIT/ML IJ SOLN
INTRAMUSCULAR | Status: DC | PRN
  Administered 2018-04-09: 3000 [IU] via INTRAVENOUS

## 2018-04-09 MED ORDER — MIDAZOLAM HCL 2 MG/2ML IJ SOLN
INTRAMUSCULAR | Status: DC | PRN
  Administered 2018-04-09: 2 mg via INTRAVENOUS

## 2018-04-09 MED ORDER — SODIUM CHLORIDE 0.9% FLUSH: 3.0000 mL | INTRAVENOUS | Status: DC | PRN

## 2018-04-09 MED ORDER — LIDOCAINE-EPINEPHRINE (PF) 1 %-1:200000 IJ SOLN
INTRAMUSCULAR | Status: AC
  Filled 2018-04-09: qty 10

## 2018-04-09 MED ORDER — DIGOXIN 0.25 MG/ML IJ SOLN
0.2500 mg | Freq: Once | INTRAMUSCULAR | Status: AC
  Administered 2018-04-09: 0.25 mg via INTRAVENOUS
  Filled 2018-04-09: qty 2

## 2018-04-09 MED ORDER — METOPROLOL TARTRATE 25 MG PO TABS
25.0000 mg | ORAL_TABLET | Freq: Two times a day (BID) | ORAL | Status: DC
  Administered 2018-04-10: 25 mg via ORAL
  Filled 2018-04-09 (×2): qty 1

## 2018-04-09 MED ORDER — SODIUM CHLORIDE 0.9 % IV SOLN: 250.0000 mL | INTRAVENOUS | Status: DC | PRN

## 2018-04-09 MED ORDER — CEFAZOLIN SODIUM-DEXTROSE 1-4 GM/50ML-% IV SOLN
1.0000 g | Freq: Once | INTRAVENOUS | Status: AC
  Administered 2018-04-09: 1 g via INTRAVENOUS

## 2018-04-09 MED ORDER — FENTANYL CITRATE (PF) 100 MCG/2ML IJ SOLN
INTRAMUSCULAR | Status: DC | PRN
  Administered 2018-04-09: 50 ug via INTRAVENOUS

## 2018-04-09 SURGICAL SUPPLY — 24 items
BALLN ULTRASCORE 014 2X200X150 (BALLOONS) ×2
BALLN ULTRSCOR 014 2.5X200X150 (BALLOONS) ×2
BALLOON ULTRSC 014 2.5X200X150 (BALLOONS) ×1 IMPLANT
BALLOON ULTRSCRE 014 2X200X150 (BALLOONS) ×1 IMPLANT
CATH BEACON 5 .035 65 RIM TIP (CATHETERS) ×2 IMPLANT
CATH CROSSER S6 154CM (CATHETERS) ×2 IMPLANT
CATH SEEKER 014X150 (CATHETERS) ×2 IMPLANT
CATH USHER TPER 130CM (CATHETERS) ×2 IMPLANT
CATH VERT 5FR 125CM (CATHETERS) ×2 IMPLANT
DEVICE PRESTO INFLATION (MISCELLANEOUS) ×2 IMPLANT
DEVICE STARCLOSE SE CLOSURE (Vascular Products) ×2 IMPLANT
DEVICE TORQUE .025-.038 (MISCELLANEOUS) ×2 IMPLANT
GUIDEWIRE STR TIP .014X300X8 (WIRE) ×2 IMPLANT
KIT FLOWMATE PROCEDURAL (KITS) ×2 IMPLANT
NEEDLE ENTRY 21GA 7CM ECHOTIP (NEEDLE) ×2 IMPLANT
PACK ANGIOGRAPHY (CUSTOM PROCEDURE TRAY) ×2 IMPLANT
SET INTRO CAPELLA COAXIAL (SET/KITS/TRAYS/PACK) ×2 IMPLANT
SHEATH BRITE TIP 5FRX11 (SHEATH) ×2 IMPLANT
SHEATH RAABE 7FR (SHEATH) ×2 IMPLANT
TUBING CONTRAST HIGH PRESS 72 (TUBING) ×2 IMPLANT
WIRE AQUATRACK .035X260CM (WIRE) ×2 IMPLANT
WIRE G V18X300CM (WIRE) ×2 IMPLANT
WIRE J 3MM .035X145CM (WIRE) ×2 IMPLANT
WIRE SPARTACORE .014X300CM (WIRE) ×2 IMPLANT

## 2018-04-09 NOTE — Progress Notes (Signed)
MRN : [id number]  Gregory Tyler is a 77 y.o. (25-Mar-1941) male who presents with chief complaint of  Chief Complaint  Patient presents with  . Follow-up  .  History of Present Illness: The patient is seen for evaluation of painful lower extremities and diminished pulses associated with ulcerations of the foot.  The patient notes several ulcers have been present for multiple weeks and have not been improving.  Of increased concern to [place] a new ulceration is now developed in the right lateral calf, the patient states it is very painful and has had some drainage.  No specific history of trauma noted by the patient.  The patient denies fever or chills.  the patient does have diabetes which has been difficult to control.  Patient notes prior to the ulcer developing the extremities were painful particularly with ambulation or activity and the discomfort is very consistent day today. Typically, the pain occurs at less than one block, progress is as activity continues to the point that the patient must stop walking. Resting including standing still for several minutes allowed resumption of the activity and the ability to walk a similar distance before stopping again. Uneven terrain and inclined shorten the distance. The pain has been progressive over the past several years.   The patient denies rest pain or dangling of an extremity off the side of the bed during the night for relief. No prior interventions or surgeries.  No history of back problems or DJD of the lumbar sacral spine.   The patient denies amaurosis fugax or recent TIA symptoms. There are no recent neurological changes noted. The patient denies history of DVT, PE or superficial thrombophlebitis. The patient denies recent episodes of angina or shortness of breath.   Current Meds  Medication Sig  . acetaminophen (TYLENOL) 325 MG tablet Take 2 tablets (650 mg total) by mouth every 6 (six) hours as needed for mild pain  (or Fever >/= 101).  Marland Kitchen allopurinol (ZYLOPRIM) 300 MG tablet Take 300 mg by mouth daily.   Marland Kitchen gabapentin (NEURONTIN) 100 MG capsule Take 1 capsule (100 mg total) by mouth at bedtime.  Marland Kitchen HYDROcodone-acetaminophen (NORCO/VICODIN) 5-325 MG tablet Take 1 tablet by mouth every 6 (six) hours as needed for moderate pain or severe pain.  . rosuvastatin (CRESTOR) 20 MG tablet Take 20 mg by mouth at bedtime.   Marland Kitchen tiotropium (SPIRIVA) 18 MCG inhalation capsule Place 18 mcg into inhaler and inhale daily.   . [DISCONTINUED] ARTIFICIAL TEARS 0.4 % SOLN Apply 1 drop to eye 2 (two) times daily. (0900 & 1700)  . [DISCONTINUED] atorvastatin (LIPITOR) 20 MG tablet Take 20 mg by mouth daily at 8 pm. (2100)  . [DISCONTINUED] ELIQUIS 2.5 MG TABS tablet Take 2.5 mg by mouth every 12 (twelve) hours. (0900 & 2100)  . [DISCONTINUED] furosemide (LASIX) 20 MG tablet Take 20 mg by mouth daily. (0900)    Past Medical History:  Diagnosis Date  . Anemia   . Anxiety   . Arthritis   . Cardiomyopathy ([place])    a. 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  Marland Kitchen Chronic combined systolic (congestive) and diastolic (congestive) heart failure ([place])    a. 05/2015 TEE: EF 35-40% (in setting of Afib); b. 08/2017 Echo: EF 55-60%, no rwma, Gr2DD; b. 01/2018 Echo: EF 55-60%, mild MR. Nl LA size. Nl RV fxn.  . Chronic Pelvic pain   . Coronary artery disease    a. 05/2015 CTA  Chest: 3 vessel coronary Ca2+.  . Depression   . Diabetes mellitus without complication ([place])   . Emphysema lung ([place])   . Gastric ulcer   . Gout   . Hyperlipidemia   . Hypertension   . Nephrolithiasis   . PAF (paroxysmal atrial fibrillation) ([place])    a. 05/2015 s/p DCCV; b. 03/2016 s/p DCCV; c. CHA2DS2VASc = 6 ([facility]).  . Prostate cancer [facility])    Prostate  . Stage III chronic kidney disease ([place])   . Weight loss    50 lb since Apr 24, 2015    Past Surgical History:  Procedure Laterality Date  . APPENDECTOMY    . BACK SURGERY    .  CHOLECYSTECTOMY    . DIALYSIS/PERMA CATHETER INSERTION N/A 01/22/2018   Procedure: DIALYSIS/PERMA CATHETER INSERTION;  Surgeon: Katha Cabal, MD;  Location: [place] [facility];  Service: Cardiovascular;  Laterality: N/A;  . DIALYSIS/PERMA CATHETER REMOVAL N/A 04/01/2018   Procedure: DIALYSIS/PERMA CATHETER REMOVAL;  Surgeon: Katha Cabal, MD;  Location: [place] [facility];  Service: Cardiovascular;  Laterality: N/A;  . ELECTROPHYSIOLOGIC STUDY N/A 06/17/2015   Procedure: CARDIOVERSION;  Surgeon: Wellington Hampshire, MD;  Location: [facility];  Service: Cardiovascular;  Laterality: N/A;  . ELECTROPHYSIOLOGIC STUDY N/A 03/30/2016   Procedure: CARDIOVERSION;  Surgeon: Minna Merritts, MD;  Location: [facility];  Service: Cardiovascular;  Laterality: N/A;  . GASTRIC BYPASS    . HEMORRHOID SURGERY    . LOWER EXTREMITY ANGIOGRAPHY Right 01/23/2018   Procedure: LOWER EXTREMITY ANGIOGRAPHY;  Surgeon: Algernon Huxley, MD;  Location: [name] [facility];  Service: Cardiovascular;  Laterality: Right;  . LOWER EXTREMITY ANGIOGRAPHY Left 01/30/2018   Procedure: LOWER EXTREMITY ANGIOGRAPHY;  Surgeon: Algernon Huxley, MD;  Location: [place] [facility];  Service: Cardiovascular;  Laterality: Left;  . pilonidal cyst    . TEE WITHOUT CARDIOVERSION N/A 06/17/2015   Procedure: TRANSESOPHAGEAL ECHOCARDIOGRAM (TEE);  Surgeon: Wellington Hampshire, MD;  Location: [facility];  Service: Cardiovascular;  Laterality: N/A;    Social History Social History   Tobacco Use  . Smoking status: Former Smoker    Last attempt to quit: 12/19/1940    Years since quitting: 77.3  . Smokeless tobacco: Former Systems developer    Types: Chew    Quit date: 11/19/2014  Substance Use Topics  . Alcohol use: No  . Drug use: No    Family History Family History  Problem Relation Age of Onset  . CAD Mother   . Stroke Mother   . COPD Mother   . Heart failure Mother   . Diabetes Other     Allergies  Allergen Reactions  . Aspirin Other (See  Comments)    GI Bleeding  . Tuberculin Tests Other (See Comments)    unknown  . Codeine Other (See Comments)    BLISTERS      REVIEW OF SYSTEMS (Negative unless checked)  Constitutional: [] Weight loss  [] Fever  [] Chills Cardiac: [] Chest pain   [] Chest pressure   [] Palpitations   [] Shortness of breath when laying flat   [] Shortness of breath with exertion. Vascular:  [] Pain in legs with walking   [x] Pain in legs at rest  [] History of DVT   [] Phlebitis   [x] Swelling in legs   [] Varicose veins   [x] Non-healing ulcers Pulmonary:   [] Uses home oxygen   [] Productive cough   [] Hemoptysis   [] Wheeze  [x] COPD   [] Asthma Neurologic:  [] Dizziness   [] Seizures   [] History of stroke   []   History of TIA  [] Aphasia   [] Vissual changes   [] Weakness or numbness in arm   [] Weakness or numbness in leg Musculoskeletal:   [] Joint swelling   [x] Joint pain   [] Low back pain Hematologic:  [] Easy bruising  [] Easy bleeding   [] Hypercoagulable state   [] Anemic Gastrointestinal:  [] Diarrhea   [] Vomiting  [] Gastroesophageal reflux/heartburn   [] Difficulty swallowing. Genitourinary:  [x] Chronic kidney disease   [] Difficult urination  [] Frequent urination   [] Blood in urine Skin:  [x] Rashes   [x] Ulcers  Psychological:  [] History of anxiety   []  History of major depression.  Physical Examination  Vitals:   04/07/18 1150  BP: (!) 94/58  Pulse: (!) 58  Resp: 18   There is no height or weight on file to calculate BMI. Gen: WD/WN, NAD Head: [place] Level/AT, No temporalis wasting.  Ear/Nose/Throat: Hearing grossly intact, nares w/o erythema or drainage Eyes: PER, EOMI, sclera nonicteric.  Neck: Supple, no large masses.   Pulmonary:  Good air movement, no audible wheezing bilaterally, no use of accessory muscles.  Cardiac: RRR, no JVD Vascular: Multiple ulcers bilaterally these are dry there is 1 ulcer now in the right lateral calf which measures several centimeters in width in diameter.  It is weeping.  There does not  appear to be any peri-ulcer cellulitis at this time.  There is no odor. Vessel Right Left  Radial Palpable Palpable  PT  not palpable  not palpable  DP  not palpable  not palpable  Gastrointestinal: Non-distended. No guarding/no peritoneal signs.  Musculoskeletal: M/S 5/5 throughout.  No deformity or atrophy.  Neurologic: CN 2-12 intact. Symmetrical.  Speech is fluent. Motor exam as listed above. Psychiatric: Judgment intact, Mood & affect appropriate for pt's clinical situation. Dermatologic: Combination of venous and ischemic skin changes rashes with multiple ulcers noted.  No changes consistent with cellulitis. Lymph : No lichenification or skin changes of chronic lymphedema.  CBC Lab Results  Component Value Date   WBC 9.3 04/09/2018   HGB 9.2 (L) 04/09/2018   HCT 29.6 (L) 04/09/2018   MCV 93.4 04/09/2018   PLT 255 04/09/2018    BMET    Component Value Date/Time   NA 135 04/09/2018 0426   NA 139 03/27/2016 1634   K 4.2 04/09/2018 0426   CL 105 04/09/2018 0426   CO2 24 04/09/2018 0426   GLUCOSE 193 (H) 04/09/2018 0426   BUN 44 (H) 04/09/2018 0426   BUN 29 (H) 03/27/2016 1634   CREATININE 1.39 (H) 04/09/2018 0426   CALCIUM 8.3 (L) 04/09/2018 0426   GFRNONAA 48 (L) 04/09/2018 0426   GFRAA 56 (L) 04/09/2018 0426   Estimated Creatinine Clearance: 42.4 mL/min (A) (by C-G formula based on SCr of 1.39 mg/dL (H)).  COAG Lab Results  Component Value Date   INR 1.30 04/07/2018   INR 1.46 01/18/2018   INR 1.75 11/19/2017    Radiology Dg Chest 2 View  Result Date: 04/07/2018 CLINICAL DATA:  Shortness of breath. EXAM: CHEST - 2 VIEW COMPARISON:  Radiograph of January 16, 2018. FINDINGS: The heart size and mediastinal contours are within normal limits. Both lungs are clear. No pneumothorax or significant pleural effusion is noted. The visualized skeletal structures are unremarkable. IMPRESSION: No active cardiopulmonary disease. Electronically Signed   By: Marijo Conception,  M.D.   On: 04/07/2018 16:21   Dg Tibia/fibula Right  Result Date: 04/07/2018 CLINICAL DATA:  Right lower leg pain EXAM: RIGHT TIBIA AND FIBULA - 2 VIEW COMPARISON:  None. FINDINGS: No  acute fracture or dislocation is noted. Vascular calcifications are seen. No gross soft tissue abnormality is noted. IMPRESSION: No acute bony abnormality is noted. Electronically Signed   By: Inez Catalina M.D.   On: 04/07/2018 16:20     Assessment/Plan 1. 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  The right leg now has a new ulcer that is quite painful, this represents a limb threatening ischemia and places the patient at the risk for limb loss.  Patient should undergo angiography of the right lower extremity with the hope for intervention for limb salvage.  The risks and benefits as well as the alternative therapies was discussed in detail with the patient.  All questions were answered.  Patient agrees to proceed with right leg angiography.  The patient will follow up with me in the office after the procedure.    2. Paroxysmal atrial fibrillation (HCC) Continue antiarrhythmia medications as already ordered, these medications have been reviewed and there are no changes at this time.  Continue anticoagulation as ordered by Cardiology Service   3. Essential hypertension Continue antihypertensive medications as already ordered, these medications have been reviewed and there are no changes at this time.   4. Coronary artery disease involving native coronary artery of native heart without angina pectoris Continue cardiac and antihypertensive medications as already ordered and reviewed, no changes at this time.  Continue statin as ordered and reviewed, no changes at this time  Nitrates PRN for  chest pain   5. Type 2 diabetes mellitus with diabetic polyneuropathy, without long-term current use of insulin (HCC) Continue hypoglycemic medications as already ordered, these medications have been reviewed and there are no changes at this time.  Hgb A1C to be monitored as already arranged by primary service    Hortencia Pilar, MD  04/09/2018 7:41 AM

## 2018-04-09 NOTE — Progress Notes (Signed)
Progress Note  Patient Name: Gregory Tyler Date of Encounter: 04/09/2018  Primary Cardiologist: Ida Rogue, MD , [facility]  Subjective   Reports having severe pain in his legs worse on the right side " Calf cramping, feels like a burning" Face is wincing in pain during our discussion, appears to come on and off Reports mild shortness of breath, not significant He does report having worsening shortness of breath over the past several weeks  Inpatient Medications    Scheduled Meds: . [MAR Hold] allopurinol  300 mg Oral Daily  . [MAR Hold] collagenase  1 application Topical QPM  . [MAR Hold] diltiazem  30 mg Oral Q6H  . [MAR Hold] gabapentin  100 mg Oral QHS  . [MAR Hold] insulin aspart  0-5 Units Subcutaneous QHS  . [MAR Hold] insulin aspart  0-9 Units Subcutaneous TID WC  . [MAR Hold] metoprolol tartrate  25 mg Oral BID  . [MAR Hold] polyvinyl alcohol  1 drop Both Eyes BID  . [MAR Hold] rosuvastatin  20 mg Oral QHS  . [MAR Hold] tiotropium  18 mcg Inhalation Daily  . [MAR Hold] torsemide  20 mg Oral Daily   Continuous Infusions: . heparin 1,050 Units/hr (04/09/18 0643)   PRN Meds: [MAR Hold] acetaminophen **OR** [MAR Hold] acetaminophen, [MAR Hold] diltiazem, [MAR Hold] HYDROcodone-acetaminophen, [MAR Hold] ipratropium-albuterol, [MAR Hold] ondansetron **OR** [MAR Hold] ondansetron (ZOFRAN) IV   Vital Signs    Vitals:   04/09/18 1715 04/09/18 1723 04/09/18 1728 04/09/18 1731  BP: 114/62  107/69 (!) 109/49  Pulse: 100 93  92  Resp: 17 15 15 15   Temp:      TempSrc:      SpO2: 100% 100% 90% 99%  Weight:      Height:        Intake/Output Summary (Last 24 hours) at 04/09/2018 1744 Last data filed at 04/09/2018 1700 Gross per 24 hour  Intake 1289.75 ml  Output 1800 ml  Net -510.25 ml   Filed Weights   04/07/18 1534 04/07/18 1848  Weight: 72.6 kg 68.8 kg    Telemetry    Atrial fibrillation rate 100 up to 120 bpm- Personally Reviewed  ECG       Physical Exam   Constitutional:  oriented to person, place, and time. No distress.  Thin HENT:  Head: Grossly normal Eyes:  no discharge. No scleral icterus.  Neck: No JVD, no carotid bruits  Cardiovascular: Irregularly irregular no murmurs appreciated Pulmonary/Chest: Clear to auscultation bilaterally, no wheezes or rails Abdominal: Soft.  no distension.  no tenderness.  Musculoskeletal: Normal range of motion, muscle atrophy Wraps on his lower extremities bilaterally Neurological:  normal muscle tone. Coordination normal. No atrophy Skin: Skin warm and dry Psychiatric: normal affect, pleasant   Labs    Chemistry Recent Labs  Lab 04/07/18 1539 04/08/18 0236 04/09/18 0426  NA 134* 132* 135  K 3.8 4.0 4.2  CL 105 104 105  CO2 21* 20* 24  GLUCOSE 297* 367* 193*  BUN 39* 42* 44*  CREATININE 1.24 1.31* 1.39*  CALCIUM 8.3* 7.8* 8.3*  PROT 6.3*  --   --   ALBUMIN 2.6*  --   --   AST 45*  --   --   ALT 54*  --   --   ALKPHOS 104  --   --   BILITOT 0.5  --   --   GFRNONAA 55* 52* 48*  GFRAA >60 >60 56*  ANIONGAP 8 8 6  Hematology Recent Labs  Lab 04/07/18 1539 04/08/18 0236 04/09/18 0426  WBC 10.0 9.0 9.3  RBC 3.60* 3.16* 3.17*  HGB 10.5* 9.4* 9.2*  HCT 34.1* 29.5* 29.6*  MCV 94.7 93.4 93.4  MCH 29.2 29.7 29.0  MCHC 30.8 31.9 31.1  RDW 15.5 15.4 15.3  PLT 289 252 255    Cardiac Enzymes Recent Labs  Lab 04/07/18 1539 04/08/18 0236  TROPONINI 0.05* 0.06*   No results for input(s): TROPIPOC in the last 168 hours.   BNP Recent Labs  Lab 04/07/18 1539  BNP 537.0*     DDimer No results for input(s): DDIMER in the last 168 hours.   Radiology    No results found.  Cardiac Studies     Patient Profile     Gregory Tyler is a 77 y.o. male with a hx of PAF, hypertension, hyperlipidemia, chronic combined systolic and diastolic heart failure, diabetes, PAD and chronic foot ulcerations, prostate cancer, CKD 3, and chronic pelvic pain who is  being seen today for the evaluation of atrial fibrillation  Assessment & Plan    Atrial fibrillation Rate improved with diltiazem 30 mg every 6 Will add metoprolol 25 twice daily and digoxin 0.25 once with 0.125 mg daily starting tomorrow -On heparin infusion Will reevaluate heart rate tomorrow If poor rate control may require transesophageal echo and cardioversion  PAD Severe disease lower extremities Discussed with Dr. [name] Plan for possible procedure later today , angio He is having worsening lower extremity claudication pain, cramping in his legs with burning  Diabetes type 2 with neuropathy Hemoglobin A1c 6.6 On sliding scale  Anemia We will need to monitor closely, Requires anticoagulation therapy for atrial fibrillation  Case discussed with hospitalist service and with vascular surgery  Total encounter time more than 35 minutes  Greater than 50% was spent in counseling and coordination of care with the patient   For questions or updates, please contact [place] Please consult [email or web address] for contact info under        Signed, Ida Rogue, MD  04/09/2018, 5:44 PM

## 2018-04-09 NOTE — Progress Notes (Signed)
[facility] [place] NAME: Gregory Tyler    MR#:  [id number]  DATE OF BIRTH:  1940-07-19  SUBJECTIVE:  CHIEF COMPLAINT:   Chief Complaint  Patient presents with  . Weakness  . Shortness of Breath   Shortness of breath and right leg pain. REVIEW OF SYSTEMS:  Review of Systems  Constitutional: Negative for chills, fever and malaise/fatigue.  HENT: Negative for sore throat.   Eyes: Negative for blurred vision and double vision.  Respiratory: Positive for shortness of breath. Negative for cough, hemoptysis, sputum production, wheezing and stridor.   Cardiovascular: Negative for chest pain, palpitations, orthopnea and leg swelling.  Gastrointestinal: Negative for abdominal pain, blood in stool, diarrhea, melena, nausea and vomiting.  Genitourinary: Negative for dysuria, flank pain and hematuria.  Musculoskeletal: Negative for back pain and joint pain.       Right leg pain.  Skin: Negative for rash.  Neurological: Negative for dizziness, sensory change, focal weakness, seizures, loss of consciousness, weakness and headaches.  Endo/Heme/Allergies: Negative for polydipsia.  Psychiatric/Behavioral: Negative for depression. The patient is not nervous/anxious.     DRUG ALLERGIES:   Allergies  Allergen Reactions  . Aspirin Other (See Comments)    GI Bleeding  . Tuberculin Tests Other (See Comments)    unknown  . Codeine Other (See Comments)    BLISTERS    VITALS:  Blood pressure (!) 104/51, pulse 90, temperature 98.7 F (37.1 C), temperature source Oral, resp. rate (!) 7, height 5\' 8"  (1.727 m), weight 68.8 kg, SpO2 100 %. PHYSICAL EXAMINATION:  Physical Exam Constitutional:      General: He is not in acute distress. HENT:     Head: Normocephalic.     Mouth/Throat:     Mouth: Mucous membranes are moist.  Eyes:     General: No scleral icterus.    Conjunctiva/sclera: Conjunctivae normal.     Pupils: Pupils are equal, round,  and reactive to light.  Neck:     Musculoskeletal: Normal range of motion and neck supple.     Vascular: No JVD.     Trachea: No tracheal deviation.  Cardiovascular:     Rate and Rhythm: Rhythm irregular.     Heart sounds: Normal heart sounds. No murmur. No gallop.   Pulmonary:     Effort: Pulmonary effort is normal. No respiratory distress.     Breath sounds: Normal breath sounds. No wheezing or rales.  Abdominal:     General: Bowel sounds are normal. There is no distension.     Palpations: Abdomen is soft.     Tenderness: There is no abdominal tenderness. There is no rebound.  Musculoskeletal: Normal range of motion.        General: No tenderness.     Right lower leg: No edema.     Left lower leg: No edema.     Comments: Right leg wound in dressing.  Skin:    Findings: No erythema or rash.  Neurological:     General: No focal deficit present.     Mental Status: He is alert and oriented to person, place, and time.     Cranial Nerves: No cranial nerve deficit.  Psychiatric:        Mood and Affect: Mood normal.    LABORATORY PANEL:  Male CBC Recent Labs  Lab 04/09/18 0426  WBC 9.3  HGB 9.2*  HCT 29.6*  PLT 255   ------------------------------------------------------------------------------------------------------------------ Chemistries  Recent Labs  Lab 04/07/18 1539  04/08/18 0236 04/09/18 0426  NA 134* 132* 135  K 3.8 4.0 4.2  CL 105 104 105  CO2 21* 20* 24  GLUCOSE 297* 367* 193*  BUN 39* 42* 44*  CREATININE 1.24 1.31* 1.39*  CALCIUM 8.3* 7.8* 8.3*  MG  --  2.0  --   AST 45*  --   --   ALT 54*  --   --   ALKPHOS 104  --   --   BILITOT 0.5  --   --    RADIOLOGY:  No results found. 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  1.  Atrial fibrillation with rapid ventricular response Continue Cardizem and IV Cardizem as needed. recent echocardiogram and September of this year which showed normal ejection fraction. Cardiologist recommend additional attempt for cardioversion. Hold Eliquis and started heparin drip per Dr. Saunders Revel.  Elevated troponin due to above.  Continue heparin drip. Chronic systolic and diastolic CHF.  LV EF: 55% -   60%.  Stable.  2.  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  Patient is followed at the wound center. Wound care. Per vascular surgeon Dr. Delana Meyer, the patient was planned for right leg procedure.  He will do procedure today.  3.  COPD-no acute exacerbation.  Continue Spiriva, PRN DuoNeb's.    4.  Hyperlipidemia-continue Crestor.  5.  Neuropathy-continue gabapentin.  6.  History of gout-continue allopurinol.  Hyperglycemia due to diabetes 2.  Hemoglobin A1c 7.7.  Continue sliding scale, the patient may need to start metformin as outpatient.  Elevated TSH.  Check T4 and T3.  Follow-up repeated TSH with PCP as outpatient. Anemia of chronic disease.  Stable. CKD stage III.  Relatively stable.  Discussed with Dr. Rockey Situ and Dr. Delana Meyer. All the records are reviewed and case discussed with Care Management/Social Worker. Management plans discussed with the patient, family and they are in agreement.  CODE STATUS: DNR  TOTAL TIME TAKING CARE OF THIS PATIENT: 35 minutes.   More than 50% of the time was spent in counseling/coordination of care: YES  POSSIBLE D/C IN 2 DAYS, DEPENDING ON CLINICAL CONDITION.   Demetrios Loll M.D on 04/09/2018 at 5:59 PM  Between 7am to 6pm - Pager - [phone]  After 6pm go to [email or web address] - Research scientist (physical sciences) Hospitalists

## 2018-04-09 NOTE — Telephone Encounter (Addendum)
A  message was left at [facility] on 04/07/18 with the coordinator and also on the patient's mobile phone for a return call to schedule the patient's angio. I have not received a return call as of yet. Dr. Delana Meyer wanted the patient scheduled for today to have a right leg angio. I usually have to schedule the patient through the coordinator for the facility so that transportation is scheduled as well.

## 2018-04-09 NOTE — Progress Notes (Signed)
Patient returned from vascular s/p angioplasty.  Accessed through L groin.  Dsg. D/I without hematoma or active bleeding.  Site soft and nontender.  Patient without complaints at this time.

## 2018-04-09 NOTE — Consult Note (Signed)
ANTICOAGULATION CONSULT NOTE - Initial Consult  Pharmacy Consult for heparin Indication: atrial fibrillation  Allergies  Allergen Reactions  . Aspirin Other (See Comments)    GI Bleeding  . Tuberculin Tests Other (See Comments)    unknown  . Codeine Other (See Comments)    BLISTERS     Patient Measurements: Height: 5\' 8"  (172.7 cm) Weight: 151 lb 11.2 oz (68.8 kg) IBW/kg (Calculated) : 68.4 Heparin Dosing Weight: 68.8 kg  Vital Signs: Temp: 98.2 F (36.8 C) (12/18 0549) Temp Source: Oral (12/18 0549) BP: 117/75 (12/18 0614) Pulse Rate: 105 (12/18 0614)  Labs: Recent Labs    04/07/18 1539 04/07/18 1706 04/08/18 0236 04/09/18 0426  HGB 10.5*  --  9.4* 9.2*  HCT 34.1*  --  29.5* 29.6*  PLT 289  --  252 255  APTT  --   --   --  59*  LABPROT  --  16.1*  --   --   INR  --  1.30  --   --   HEPARINUNFRC  --   --   --  2.06*  CREATININE 1.24  --  1.31* 1.39*  TROPONINI 0.05*  --  0.06*  --     Estimated Creatinine Clearance: 42.4 mL/min (A) (by C-G formula based on SCr of 1.39 mg/dL (H)).   Medical History: Past Medical History:  Diagnosis Date  . Anemia   . Anxiety   . Arthritis   . Cardiomyopathy ([place])    a. 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  Marland Kitchen Chronic combined systolic (congestive) and diastolic (congestive) heart failure ([place])    a. 05/2015 TEE: EF 35-40% (in setting of Afib); b. 08/2017 Echo: EF 55-60%, no rwma, Gr2DD; b. 01/2018 Echo: EF 55-60%, mild MR. Nl LA size. Nl RV fxn.  . Chronic Pelvic pain   . Coronary artery disease    a. 05/2015 CTA Chest: 3 vessel coronary Ca2+.  . Depression   . Diabetes mellitus without complication ([place])   . Emphysema lung ([place])   . Gastric ulcer   . Gout   . Hyperlipidemia   . Hypertension   . Nephrolithiasis   . PAF (paroxysmal atrial fibrillation) ([place])    a. 05/2015 s/p DCCV; b. 03/2016 s/p DCCV; c. CHA2DS2VASc = 6 (Eliquis).  . Prostate cancer [facility])    Prostate  . Stage III chronic  kidney disease ([place])   . Weight loss    50 lb since Apr 24, 2015    Medications:  Scheduled:  . allopurinol  300 mg Oral Daily  . collagenase  1 application Topical QPM  . diltiazem  30 mg Oral Q6H  . gabapentin  100 mg Oral QHS  . insulin aspart  0-5 Units Subcutaneous QHS  . insulin aspart  0-9 Units Subcutaneous TID WC  . polyvinyl alcohol  1 drop Both Eyes BID  . rosuvastatin  20 mg Oral QHS  . tiotropium  18 mcg Inhalation Daily  . torsemide  20 mg Oral Daily    Assessment: 77 yo male admitted on 12/16 with afib. Patient presented from wound clinic after experiencing tachycardia without hypotension. Takes apixaban PTA for afib. Last dose this AM at 0900.   Goal of Therapy:  Heparin level 0.3-0.7 units/ml Monitor platelets by anticoagulation protocol: Yes   Plan:  12/18 @ 0430 aPTT 59 seconds, HL 2.06. aPTT subtherapeutic, both levels not correlating will continuing dosing off aPTT. Will increase rate of heparin to 1050 units/hr  and will recheck @ 1400, Hgb trending down will continue to monitor.  Tobie Lords, PharmD 04/09/2018 6:35 AM

## 2018-04-09 NOTE — Consult Note (Signed)
ANTICOAGULATION CONSULT NOTE - Initial Consult  Pharmacy Consult for heparin Indication: atrial fibrillation  Allergies  Allergen Reactions  . Aspirin Other (See Comments)    GI Bleeding  . Tuberculin Tests Other (See Comments)    unknown  . Codeine Other (See Comments)    BLISTERS     Patient Measurements: Height: 5\' 8"  (172.7 cm) Weight: 151 lb 11.2 oz (68.8 kg) IBW/kg (Calculated) : 68.4 Heparin Dosing Weight: 68.8 kg  Vital Signs: Temp: 98.2 F (36.8 C) (12/18 1829) Temp Source: Oral (12/18 1829) BP: 100/59 (12/18 1829) Pulse Rate: 98 (12/18 1829)  Labs: Recent Labs    04/07/18 1539 04/07/18 1706 04/08/18 0236 04/09/18 0426 04/09/18 1355  HGB 10.5*  --  9.4* 9.2*  --   HCT 34.1*  --  29.5* 29.6*  --   PLT 289  --  252 255  --   APTT  --   --   --  59* 44*  LABPROT  --  16.1*  --   --   --   INR  --  1.30  --   --   --   HEPARINUNFRC  --   --   --  2.06*  --   CREATININE 1.24  --  1.31* 1.39*  --   TROPONINI 0.05*  --  0.06*  --   --     Estimated Creatinine Clearance: 42.4 mL/min (A) (by C-G formula based on SCr of 1.39 mg/dL (H)).   Medical History: Past Medical History:  Diagnosis Date  . Anemia   . Anxiety   . Arthritis   . Cardiomyopathy ([place])    a. 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  Marland Kitchen Chronic combined systolic (congestive) and diastolic (congestive) heart failure ([place])    a. 05/2015 TEE: EF 35-40% (in setting of Afib); b. 08/2017 Echo: EF 55-60%, no rwma, Gr2DD; b. 01/2018 Echo: EF 55-60%, mild MR. Nl LA size. Nl RV fxn.  . Chronic Pelvic pain   . Coronary artery disease    a. 05/2015 CTA Chest: 3 vessel coronary Ca2+.  . Depression   . Diabetes mellitus without complication ([place])   . Emphysema lung ([place])   . Gastric ulcer   . Gout   . Hyperlipidemia   . Hypertension   . Nephrolithiasis   . PAF (paroxysmal atrial fibrillation) ([place])    a. 05/2015 s/p DCCV; b. 03/2016 s/p DCCV; c. CHA2DS2VASc = 6 (Eliquis).  .  Prostate cancer [facility])    Prostate  . Stage III chronic kidney disease ([place])   . Weight loss    50 lb since Apr 24, 2015    Medications:  Scheduled:  . allopurinol  300 mg Oral Daily  . collagenase  1 application Topical QPM  . diltiazem  30 mg Oral Q6H  . gabapentin  100 mg Oral QHS  . insulin aspart  0-5 Units Subcutaneous QHS  . insulin aspart  0-9 Units Subcutaneous TID WC  . metoprolol tartrate  25 mg Oral BID  . polyvinyl alcohol  1 drop Both Eyes BID  . rosuvastatin  20 mg Oral QHS  . sodium chloride flush  3 mL Intravenous Q12H  . tiotropium  18 mcg Inhalation Daily  . torsemide  20 mg Oral Daily    Assessment: 77 yo male admitted on 12/16 with afib. Patient presented from wound clinic after experiencing tachycardia without hypotension. Takes apixaban PTA for afib. Last dose this AM at 0900.  Goal of Therapy:  Heparin level 0.3-0.7 units/ml Monitor platelets by anticoagulation protocol: Yes   Plan:  Upon completion of procedure in angio patient had been restarted on the heparin infusion 1050 units/hr.  Will draw aPTT 8 hours post restart.  Drip was restarted at [place] on 12/18  Forrest Moron, PharmD Clinical Pharmacist 04/09/2018 6:54 PM

## 2018-04-09 NOTE — Progress Notes (Signed)
Inpatient Diabetes Program Recommendations  AACE/ADA: New Consensus Statement on Inpatient Glycemic Control (2015)  Target Ranges:  Prepandial:   less than 140 mg/dL      Peak postprandial:   less than 180 mg/dL (1-2 hours)      Critically ill patients:  140 - 180 mg/dL   Lab Results  Component Value Date   GLUCAP 156 (H) 04/09/2018   HGBA1C 7.7 (H) 04/08/2018   Results for Gregory Tyler, Gregory Tyler (MRN [id number]) as of 04/09/2018 10:41  Ref. Range 05/11/2015 13:20 09/11/2017 19:52 11/15/2017 13:52 04/08/2018 02:36  Hemoglobin A1C Latest Ref Range: 4.8 - 5.6 % 8.0 (H) 6.8 (H) 6.6 (H) 7.7 (H)   Review of Glycemic Control Results for [name], [name] (MRN [id number]) as of 04/09/2018 10:41  Ref. Range 04/08/2018 11:38 04/08/2018 16:43 04/08/2018 20:53 04/09/2018 07:32  Glucose-Capillary Latest Ref Range: 70 - 99 mg/dL 262 (H) 201 (H) 339 (H) 156 (H)   Diabetes history: DM 2 Outpatient Diabetes medications: None Current orders for Inpatient glycemic control:  Novolog sensitive tid with meals and HS  Inpatient Diabetes Program Recommendations:    While in the hospital, may consider adding low dose basal insulin such as Levemir 8 units daily.  Based on renal function, likely not a candidate for metformin?  A1C is acceptable for age, therefore may just warrant daily monitoring after d/c?  Thanks,  Adah Perl, RN, BC-ADM Inpatient Diabetes Coordinator Pager [phone] (8a-5p)

## 2018-04-09 NOTE — Op Note (Signed)
[facility] Percutaneous Study/Intervention Procedural Note   Date of Surgery: 04/09/2018  Surgeon:  Katha Cabal, MD.  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 Introduction catheter into right lower extremity 3rd order catheter placement  2. Contrast injection right lower extremity for distal runoff   3. Crosser atherectomy of the right anterior tibial artery 4. Percutaneous transluminal angioplasty right anterior tibial artery with a 2.5 balloon             5.   Star close closure left common femoral arteriotomy                Anesthesia: Conscious sedation was administered under my direct supervision by the interventional radiology RN. IV Versed plus fentanyl were utilized. Continuous ECG, pulse oximetry and blood pressure was monitored throughout the entire procedure. Conscious sedation was for a total of 58 minutes.  Sheath: 7 French Raby left common femoral artery retrograde  Contrast: 75 cc  Fluoroscopy Time: 13.0 minutes  Indications: [name] presents with atherosclerotic occlusive disease bilateral lower extremities. The patient has developed ulceration of the soft tissues of the right lower extremity. This places the patient at high risk for limb loss and amputation. The risks and benefits are reviewed all questions answered patient agrees to proceed.  Procedure: ELIH MOONEY is a 77 y.o. y.o. male who was identified and appropriate procedural time out was performed. The patient was then placed supine on the table and prepped and draped in the usual sterile fashion.   Ultrasound was placed in the sterile sleeve and the left groin was evaluated the left common femoral artery was echolucent and pulsatile indicating patency.   Image was recorded for the permanent record and under real-time visualization a microneedle was inserted into the common femoral artery microwire followed by a micro-sheath.  A J-wire was then advanced through the micro-sheath and a  5 [place] sheath was then inserted over a J-wire. J-wire was then advanced and a 5 French pigtail catheter was positioned at the level of the aortic bifurcation and a LAO view of the pelvis was obtained.  Subsequently a rim catheter with the stiff angle Glidewire was used to cross the aortic bifurcation the catheter wire were advanced down into the right distal external iliac artery. Oblique view of the femoral bifurcation was then obtained and subsequently the wire was reintroduced and the pigtail catheter negotiated into the SFA representing third order catheter placement. Distal runoff was then performed.  Interpretation: The distal aorta right common and external iliac arteries have diffuse atherosclerotic changes but there are no hemodynamically significant stenoses.  The right common femoral profunda femoris and superficial femoral as well as the popliteal arteries again demonstrate diffuse atherosclerotic changes but no hemodynamically significant stenosis.  The trifurcation is heavily diseased with occlusion of the anterior tibial and posterior tibial throughout their course.  The tibioperoneal trunk and peroneal are patent down to the ankle and collaterals fill the foot.  Given the new worsening ulcer of the lateral calf I elected to pursue revascularization of the anterior tibial artery.  5000 units of heparin was then given and allowed to circulate and a 7 [place] Raby sheath was advanced up and over the bifurcation and positioned in the common femoral artery.  Wire and catheter were then used to negotiate through the SFA and the catheter was then positioned in the distal  popliteal where magnified imaging of the trifurcation was performed.  The S6 Crosser catheter  was then prepped on the field and a straight Usher catheter was advanced into the cul-de-sac of the anterior tibial under magnified imaging. Using the Crosser catheter the occlusion of the anterior tibial was negotiated down to the dorsalis pedis.  A V 14 wire and an 014  seeker catheter were negotiated down to the level of the dorsalis pedis.  Hand injection of contrast confirmed intraluminal positioning.  The seeker catheter was then removed.  Initially, a 2 mm x 15 cm ultra score balloon was advanced down to the level of the dorsalis pedis inflation was to 10 atm for 1 minute.  The balloon was repositioned and the proximal half of the anterior tibial was also treated again with an inflation to 10 atm for 1 minute.  I then advanced a 2.5 mm x 15 cm ultra score balloon and in a similar fashion to separate inflations were performed each to 10 to 12 atm for 1 minute.  Follow-up imaging by hand-injection now demonstrated wide patency of the anterior tibial filling the dorsalis pedis with less than 10% residual stenosis throughout the entire course.  There is now direct filling to the area of ulceration as well as directly to the foot.  After review of these images the sheath is pulled into the left external iliac oblique of the common femoral is obtained and a Star close device deployed. There no immediate Complications.  Findings: The distal abdominal aorta is opacified with a bolus injection contrast.  The distal aorta right common and external iliac arteries have diffuse atherosclerotic changes but there are no hemodynamically significant stenoses.  The right common femoral profunda femoris and superficial femoral as well as the popliteal arteries again demonstrate diffuse atherosclerotic changes but no hemodynamically significant stenosis.  The trifurcation is heavily diseased with occlusion of the anterior tibial and posterior tibial throughout their course.  The tibioperoneal trunk and peroneal are patent  down to the ankle and collaterals fill the foot.  Crosser atherectomy of the anterior tibial successful.  Following angioplasty of the anterior tibial it is now widely patent with in-line flow and looks quite nice with less than 10% residual stenosis.   Disposition: Patient was taken to the recovery room in stable condition having tolerated the procedure well.  Belenda Cruise Schnier 04/09/2018,5:59 PM

## 2018-04-09 NOTE — Plan of Care (Signed)
  Problem: Health Behavior/Discharge Planning: Goal: Ability to manage health-related needs will improve Outcome: Progressing   Problem: Clinical Measurements: Goal: Respiratory complications will improve Outcome: Progressing   Problem: Pain Managment: Goal: General experience of comfort will improve Outcome: Progressing   Problem: Safety: Goal: Ability to remain free from injury will improve Outcome: Progressing   

## 2018-04-10 ENCOUNTER — Encounter: Payer: Self-pay | Admitting: Vascular Surgery

## 2018-04-10 DIAGNOSIS — Z7189 Other specified counseling: Secondary | ICD-10-CM

## 2018-04-10 DIAGNOSIS — I70232 Atherosclerosis of native arteries of right leg with ulceration of calf: Secondary | ICD-10-CM

## 2018-04-10 DIAGNOSIS — L97219 Non-pressure chronic ulcer of right calf with unspecified severity: Secondary | ICD-10-CM

## 2018-04-10 DIAGNOSIS — Z9862 Peripheral vascular angioplasty status: Secondary | ICD-10-CM

## 2018-04-10 DIAGNOSIS — J811 Chronic pulmonary edema: Secondary | ICD-10-CM

## 2018-04-10 LAB — CBC
HCT: 30.5 % — ABNORMAL LOW (ref 39.0–52.0)
Hemoglobin: 9.6 g/dL — ABNORMAL LOW (ref 13.0–17.0)
MCH: 29.8 pg (ref 26.0–34.0)
MCHC: 31.5 g/dL (ref 30.0–36.0)
MCV: 94.7 fL (ref 80.0–100.0)
PLATELETS: 249 10*3/uL (ref 150–400)
RBC: 3.22 MIL/uL — ABNORMAL LOW (ref 4.22–5.81)
RDW: 15.6 % — AB (ref 11.5–15.5)
WBC: 9.5 10*3/uL (ref 4.0–10.5)
nRBC: 0 % (ref 0.0–0.2)

## 2018-04-10 LAB — T4: T4, Total: 5.8 ug/dL (ref 4.5–12.0)

## 2018-04-10 LAB — GLUCOSE, CAPILLARY
Glucose-Capillary: 140 mg/dL — ABNORMAL HIGH (ref 70–99)
Glucose-Capillary: 158 mg/dL — ABNORMAL HIGH (ref 70–99)

## 2018-04-10 LAB — HEPARIN LEVEL (UNFRACTIONATED): Heparin Unfractionated: 1.36 IU/mL — ABNORMAL HIGH (ref 0.30–0.70)

## 2018-04-10 LAB — APTT
aPTT: 62 seconds — ABNORMAL HIGH (ref 24–36)
aPTT: 74 seconds — ABNORMAL HIGH (ref 24–36)

## 2018-04-10 LAB — T3: T3, Total: 70 ng/dL — ABNORMAL LOW (ref 71–180)

## 2018-04-10 MED ORDER — APIXABAN 5 MG PO TABS: 5.0000 mg | ORAL_TABLET | Freq: Two times a day (BID) | ORAL | 0 refills | Status: DC

## 2018-04-10 MED ORDER — DIGOXIN 125 MCG PO TABS
0.0625 mg | ORAL_TABLET | Freq: Every day | ORAL | Status: DC
  Filled 2018-04-10: qty 0.5

## 2018-04-10 MED ORDER — METOPROLOL TARTRATE 25 MG PO TABS: 25.0000 mg | ORAL_TABLET | Freq: Two times a day (BID) | ORAL | 0 refills | Status: DC

## 2018-04-10 MED ORDER — APIXABAN 5 MG PO TABS: 5.0000 mg | ORAL_TABLET | Freq: Two times a day (BID) | ORAL | Status: DC

## 2018-04-10 MED ORDER — METFORMIN HCL 500 MG PO TABS: 500.0000 mg | ORAL_TABLET | Freq: Every day | ORAL | 0 refills | Status: DC

## 2018-04-10 MED ORDER — DIGOXIN 62.5 MCG PO TABS: 0.0625 mg | ORAL_TABLET | Freq: Every day | ORAL | 0 refills | Status: DC

## 2018-04-10 MED ORDER — DILTIAZEM HCL 30 MG PO TABS: 30.0000 mg | ORAL_TABLET | Freq: Three times a day (TID) | ORAL | Status: DC

## 2018-04-10 MED ORDER — DILTIAZEM HCL 30 MG PO TABS: 30.0000 mg | ORAL_TABLET | Freq: Three times a day (TID) | ORAL | 0 refills | Status: DC

## 2018-04-10 MED ORDER — METFORMIN HCL 500 MG PO TABS: 500.0000 mg | ORAL_TABLET | Freq: Every day | ORAL | Status: DC

## 2018-04-10 NOTE — Progress Notes (Signed)
Family Meeting Note  Advance Directive:yes  Today a meeting took place with the Patient. The following clinical team members were present during this meeting:MD  The following were discussed:Patient's diagnosis: PAD Atrial fibrillation  , Patient's progosis: Unable to determine and Goals for treatment: DNR  Additional follow-up to be provided: no change in AD up dated Son is POA DNR yellow sheet signed and in chart   Time spent during discussion:16 minutes  Annastyn Silvey, MD

## 2018-04-10 NOTE — Consult Note (Signed)
ANTICOAGULATION CONSULT NOTE - Initial Consult  Pharmacy Consult for heparin Indication: atrial fibrillation  Allergies  Allergen Reactions  . Aspirin Other (See Comments)    GI Bleeding  . Tuberculin Tests Other (See Comments)    unknown  . Codeine Other (See Comments)    BLISTERS     Patient Measurements: Height: 5\' 8"  (172.7 cm) Weight: 145 lb 1.6 oz (65.8 kg) IBW/kg (Calculated) : 68.4 Heparin Dosing Weight: 68.8 kg  Vital Signs: Temp: 98.4 F (36.9 C) (12/19 0752) Temp Source: Oral (12/19 0752) BP: 105/60 (12/19 0752) Pulse Rate: 51 (12/19 0752)  Labs: Recent Labs    04/07/18 1539 04/07/18 1706 04/08/18 0236  04/09/18 0426 04/09/18 1355 04/10/18 0129 04/10/18 1116  HGB 10.5*  --  9.4*  --  9.2*  --  9.6*  --   HCT 34.1*  --  29.5*  --  29.6*  --  30.5*  --   PLT 289  --  252  --  255  --  249  --   APTT  --   --   --    < > 59* 44* 62* 74*  LABPROT  --  16.1*  --   --   --   --   --   --   INR  --  1.30  --   --   --   --   --   --   HEPARINUNFRC  --   --   --   --  2.06*  --  1.36*  --   CREATININE 1.24  --  1.31*  --  1.39*  --   --   --   TROPONINI 0.05*  --  0.06*  --   --   --   --   --    < > = values in this interval not displayed.    Estimated Creatinine Clearance: 40.8 mL/min (A) (by C-G formula based on SCr of 1.39 mg/dL (H)).   Medical History: Past Medical History:  Diagnosis Date  . Anemia   . Anxiety   . Arthritis   . Cardiomyopathy ([place])    a. 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  Marland Kitchen Chronic combined systolic (congestive) and diastolic (congestive) heart failure ([place])    a. 05/2015 TEE: EF 35-40% (in setting of Afib); b. 08/2017 Echo: EF 55-60%, no rwma, Gr2DD; b. 01/2018 Echo: EF 55-60%, mild MR. Nl LA size. Nl RV fxn.  . Chronic Pelvic pain   . Coronary artery disease    a. 05/2015 CTA Chest: 3 vessel coronary Ca2+.  . Depression   . Diabetes mellitus without complication ([place])   . Emphysema lung ([place])   .  Gastric ulcer   . Gout   . Hyperlipidemia   . Hypertension   . Nephrolithiasis   . PAF (paroxysmal atrial fibrillation) ([place])    a. 05/2015 s/p DCCV; b. 03/2016 s/p DCCV; c. CHA2DS2VASc = 6 (Eliquis).  . Prostate cancer [facility])    Prostate  . Stage III chronic kidney disease ([place])   . Weight loss    50 lb since Apr 24, 2015    Medications:  Scheduled:  . allopurinol  300 mg Oral Daily  . collagenase  1 application Topical QPM  . diltiazem  30 mg Oral Q6H  . gabapentin  100 mg Oral QHS  . insulin aspart  0-5 Units Subcutaneous QHS  . insulin aspart  0-9 Units Subcutaneous TID WC  .  metoprolol tartrate  25 mg Oral BID  . polyvinyl alcohol  1 drop Both Eyes BID  . rosuvastatin  20 mg Oral QHS  . sodium chloride flush  3 mL Intravenous Q12H  . tiotropium  18 mcg Inhalation Daily  . torsemide  20 mg Oral Daily    Assessment: 77 yo male admitted on 12/16 with afib. Patient presented from wound clinic after experiencing tachycardia without hypotension. Takes apixaban PTA for afib. Last dose this AM at 0900.   12/19 11:16 aPTT 74 seconds, Heparin currently infusing at 1150 units/hr  Goal of Therapy:  Heparin level 0.3-0.7 units/ml  APTT 66-102 seconds Monitor platelets by anticoagulation protocol: Yes   Plan:  APTT is within range. Will continue heparin 1150 units/hr and will recheck aPTT @ 1900 for confirmation. Expect HL to still be supratherapeutic due to previous doses of apixaban, will recheck with AM labs. CBC low stable will continue to monitor.  Paulina Fusi, PharmD, BCPS 04/10/2018 12:13 PM

## 2018-04-10 NOTE — Discharge Instructions (Signed)
Weakness Weakness is a lack of strength. You may feel weak all over your body (generalized), or you may feel weak in one specific part of your body (focal). Common causes of weakness include:  Infection and immune system disorders.  Physical exhaustion.  Internal bleeding or other blood loss that results in a lack of red blood cells (anemia).  Dehydration.  An imbalance in mineral (electrolyte) levels, such as potassium.  Heart disease, circulation problems, or stroke. Other causes include:  Some medicines or cancer treatment.  Stress, anxiety, or depression.  Nervous system disorders.  Thyroid disorders.  Loss of muscle strength because of age or inactivity.  Poor sleep quality or sleep disorders. The cause of your weakness may not be known. Some causes of weakness can be serious, so it is important to see your health care provider. Follow these instructions at home: Activity  Rest as needed.  Try to get enough sleep. Most adults need 7-8 hours of quality sleep each night. Talk to your health care provider about how much sleep you need each night.  Do exercises, such as arm curls and leg raises, for 30 minutes at least 2 days a week or as told by your health care provider. This helps build muscle strength.  Consider working with a physical therapist or trainer who can develop an exercise plan to help you gain muscle strength. General instructions   Take over-the-counter and prescription medicines only as told by your health care provider.  Eat a healthy, well-balanced diet. This includes: ? Proteins to build muscles, such as lean meats and fish. ? Fresh fruits and vegetables. ? Carbohydrates to boost energy, such as whole grains.  Drink enough fluid to keep your urine pale yellow.  Keep all follow-up visits as told by your health care provider. This is important. Contact a health care provider if your weakness:  Does not improve or gets worse.  Affects your  ability to think clearly.  Affects your ability to do your normal daily activities. Get help right away if you:  Develop sudden weakness, especially on one side of your face or body.  Have chest pain.  Have trouble breathing or shortness of breath.  Have problems with your vision.  Have trouble talking or swallowing.  Have trouble standing or walking.  Are light-headed or lose consciousness. Summary  Weakness is a lack of strength. You may feel weak all over your body or just in one specific part of your body.  Weakness can be caused by a variety of things. In some cases, the cause may be unknown.  Rest as needed, and try to get enough sleep. Most adults need 7-8 hours of quality sleep each night.  Eat a healthy, well-balanced diet. This information is not intended to replace advice given to you by your health care provider. Make sure you discuss any questions you have with your health care provider. Document Released: 04/09/2005 Document Revised: 11/13/2017 Document Reviewed: 11/13/2017 Elsevier Interactive Patient Education  2019 [employer].

## 2018-04-10 NOTE — Clinical Social Work Note (Signed)
Patient to be d/c'ed today to [facility] room 315A.  Patient and family agreeable to plans will transport via ems RN to call report to [phone].  Evette Cristal, MSW, [place]

## 2018-04-10 NOTE — Progress Notes (Signed)
Progress Note  Patient Name: Gregory Tyler Date of Encounter: 04/10/2018  Primary Cardiologist: Ida Rogue, MD , [facility]  Subjective   Had angiogram with PTCA  Yesterday postoperative day #1 Crosser atherectomy of theright anterior tibialartery,Percutaneous transluminal angioplastyright anterior tibialartery with a 2.5balloon  Reports his calf pain has improved Was short of breath when he came into the hospital, less short of breath now Does not move, is essentially bedbound Reports at rehab facility he will stay out of bed for 1 or 2 hours then wants to go back to bed, gets tired Does not walk anymore Does not appreciate tachycardia or palpitations -Tolerating diltiazem 30 every 6, metoprolol, digoxin0.0625  Inpatient Medications    Scheduled Meds: . allopurinol  300 mg Oral Daily  . apixaban  5 mg Oral BID  . collagenase  1 application Topical QPM  . digoxin  0.0625 mg Oral Daily  . diltiazem  30 mg Oral Q8H  . gabapentin  100 mg Oral QHS  . insulin aspart  0-5 Units Subcutaneous QHS  . insulin aspart  0-9 Units Subcutaneous TID WC  . [START ON 04/11/2018] metFORMIN  500 mg Oral Q breakfast  . metoprolol tartrate  25 mg Oral BID  . polyvinyl alcohol  1 drop Both Eyes BID  . rosuvastatin  20 mg Oral QHS  . sodium chloride flush  3 mL Intravenous Q12H  . tiotropium  18 mcg Inhalation Daily  . torsemide  20 mg Oral Daily   Continuous Infusions: . sodium chloride     PRN Meds: sodium chloride, acetaminophen **OR** acetaminophen, HYDROcodone-acetaminophen, ipratropium-albuterol, ondansetron **OR** ondansetron (ZOFRAN) IV, sodium chloride flush   Vital Signs    Vitals:   04/10/18 0351 04/10/18 0612 04/10/18 0752 04/10/18 1510  BP: 102/72 109/72 105/60 108/74  Pulse: (!) 27 92 (!) 51 84  Resp: 18  18 20   Temp: 98 F (36.7 C)  98.4 F (36.9 C) 98.4 F (36.9 C)  TempSrc: Oral  Oral Oral  SpO2: 96%  97% 98%  Weight: 65.8 kg     Height:         Intake/Output Summary (Last 24 hours) at 04/10/2018 1759 Last data filed at 04/10/2018 1100 Gross per 24 hour  Intake 197.54 ml  Output 502 ml  Net -304.46 ml   Filed Weights   04/07/18 1534 04/07/18 1848 04/10/18 0351  Weight: 72.6 kg 68.8 kg 65.8 kg    Telemetry    Atrial fibrillation rate 89 to 92   Personally Reviewed  ECG      Physical Exam   Constitutional:  oriented to person, place, and time. No distress.  HENT:  Head: Grossly normal Eyes:  no discharge. No scleral icterus.  Neck: No JVD, no carotid bruits  Cardiovascular: Irregularly irregular no murmurs appreciated Pulmonary/Chest: Clear to auscultation bilaterally, no wheezes or rails Abdominal: Soft.  no distension.  no tenderness.  Musculoskeletal: Limited range of motion, atrophy noted Wraps noted on lower extremities Neurological: Grossly nonfocal Skin: Skin warm and dry Psychiatric: normal affect, pleasant   Labs    Chemistry Recent Labs  Lab 04/07/18 1539 04/08/18 0236 04/09/18 0426  NA 134* 132* 135  K 3.8 4.0 4.2  CL 105 104 105  CO2 21* 20* 24  GLUCOSE 297* 367* 193*  BUN 39* 42* 44*  CREATININE 1.24 1.31* 1.39*  CALCIUM 8.3* 7.8* 8.3*  PROT 6.3*  --   --   ALBUMIN 2.6*  --   --   AST 45*  --   --  ALT 54*  --   --   ALKPHOS 104  --   --   BILITOT 0.5  --   --   GFRNONAA 55* 52* 48*  GFRAA >60 >60 56*  ANIONGAP 8 8 6      Hematology Recent Labs  Lab 04/08/18 0236 04/09/18 0426 04/10/18 0129  WBC 9.0 9.3 9.5  RBC 3.16* 3.17* 3.22*  HGB 9.4* 9.2* 9.6*  HCT 29.5* 29.6* 30.5*  MCV 93.4 93.4 94.7  MCH 29.7 29.0 29.8  MCHC 31.9 31.1 31.5  RDW 15.4 15.3 15.6*  PLT 252 255 249    Cardiac Enzymes Recent Labs  Lab 04/07/18 1539 04/08/18 0236  TROPONINI 0.05* 0.06*   No results for input(s): TROPIPOC in the last 168 hours.   BNP Recent Labs  Lab 04/07/18 1539  BNP 537.0*     DDimer No results for input(s): DDIMER in the last 168 hours.   Radiology    No  results found.  Cardiac Studies    Patient Profile     Gregory Tyler is a 77 y.o. male with a hx of PAF, hypertension, hyperlipidemia, chronic combined systolic and diastolic heart failure, diabetes, PAD and chronic foot ulcerations, prostate cancer, CKD 3, and chronic pelvic pain who is being seen today for the evaluation of atrial fibrillation  Assessment & Plan    Atrial fibrillation Rate adequate on current medication regimen Would continue diltiazem 30 mg every 8 hrs, Continue metoprolol 25 twice daily and digoxin 0.0625 ug daily Eliquis 5 twice daily Change in anticoagulation dose discussed with him -Recommend use of humidifiers for prior history of nosebleed  PAD Severe disease lower extremities Discussed with Dr. [name] PTCA yesterday for worsening lower extremity claudication pain, cramping in his legs with burning  Diabetes type 2 with neuropathy Hemoglobin A1c 6.6 On sliding scale  Anemia Hematocrit 30.5 Would monitor closely on Eliquis 5 twice daily  Case discussed with hospitalist service  Total encounter time more than 25 minutes  Greater than 50% was spent in counseling and coordination of care with the patient  For questions or updates, please contact [place] Please consult [email or web address] for contact info under       Signed, Ida Rogue, MD  04/10/2018, 5:59 PM

## 2018-04-10 NOTE — Consult Note (Signed)
ANTICOAGULATION CONSULT NOTE - Initial Consult  Pharmacy Consult for heparin Indication: atrial fibrillation  Allergies  Allergen Reactions  . Aspirin Other (See Comments)    GI Bleeding  . Tuberculin Tests Other (See Comments)    unknown  . Codeine Other (See Comments)    BLISTERS     Patient Measurements: Height: 5\' 8"  (172.7 cm) Weight: 151 lb 11.2 oz (68.8 kg) IBW/kg (Calculated) : 68.4 Heparin Dosing Weight: 68.8 kg  Vital Signs: Temp: 98.2 F (36.8 C) (12/18 1927) Temp Source: Oral (12/18 1829) BP: 103/65 (12/18 2359) Pulse Rate: 97 (12/18 2359)  Labs: Recent Labs    04/07/18 1539 04/07/18 1706 04/08/18 0236 04/09/18 0426 04/09/18 1355 04/10/18 0129  HGB 10.5*  --  9.4* 9.2*  --  9.6*  HCT 34.1*  --  29.5* 29.6*  --  30.5*  PLT 289  --  252 255  --  249  APTT  --   --   --  59* 44* 62*  LABPROT  --  16.1*  --   --   --   --   INR  --  1.30  --   --   --   --   HEPARINUNFRC  --   --   --  2.06*  --   --   CREATININE 1.24  --  1.31* 1.39*  --   --   TROPONINI 0.05*  --  0.06*  --   --   --     Estimated Creatinine Clearance: 42.4 mL/min (A) (by C-G formula based on SCr of 1.39 mg/dL (H)).   Medical History: Past Medical History:  Diagnosis Date  . Anemia   . Anxiety   . Arthritis   . Cardiomyopathy ([place])    a. 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  Marland Kitchen Chronic combined systolic (congestive) and diastolic (congestive) heart failure ([place])    a. 05/2015 TEE: EF 35-40% (in setting of Afib); b. 08/2017 Echo: EF 55-60%, no rwma, Gr2DD; b. 01/2018 Echo: EF 55-60%, mild MR. Nl LA size. Nl RV fxn.  . Chronic Pelvic pain   . Coronary artery disease    a. 05/2015 CTA Chest: 3 vessel coronary Ca2+.  . Depression   . Diabetes mellitus without complication ([place])   . Emphysema lung ([place])   . Gastric ulcer   . Gout   . Hyperlipidemia   . Hypertension   . Nephrolithiasis   . PAF (paroxysmal atrial fibrillation) ([place])    a. 05/2015 s/p DCCV;  b. 03/2016 s/p DCCV; c. CHA2DS2VASc = 6 (Eliquis).  . Prostate cancer [facility])    Prostate  . Stage III chronic kidney disease ([place])   . Weight loss    50 lb since Apr 24, 2015    Medications:  Scheduled:  . allopurinol  300 mg Oral Daily  . collagenase  1 application Topical QPM  . diltiazem  30 mg Oral Q6H  . gabapentin  100 mg Oral QHS  . insulin aspart  0-5 Units Subcutaneous QHS  . insulin aspart  0-9 Units Subcutaneous TID WC  . metoprolol tartrate  25 mg Oral BID  . polyvinyl alcohol  1 drop Both Eyes BID  . rosuvastatin  20 mg Oral QHS  . sodium chloride flush  3 mL Intravenous Q12H  . tiotropium  18 mcg Inhalation Daily  . torsemide  20 mg Oral Daily    Assessment: 77 yo male admitted on 12/16 with afib. Patient  presented from wound clinic after experiencing tachycardia without hypotension. Takes apixaban PTA for afib. Last dose this AM at 0900.   Goal of Therapy:  Heparin level 0.3-0.7 units/ml Monitor platelets by anticoagulation protocol: Yes   Plan:  12/19 @ 0130 aPTT 62 seconds, subtherapeutic. Will increase heparin drip to 1150 units/hr and will recheck HL @ 1100. Expect HL to still be supratherapeutic. CBC low stable will continue to monitor.  Tobie Lords, PharmD Clinical Pharmacist 04/10/2018 3:25 AM

## 2018-04-10 NOTE — Progress Notes (Signed)
Called and gave facility nurse report at this time. All questions answered. [facility] for non-emergent patient transport at this time. Awaiting arrival. Wenda Low [facility]

## 2018-04-10 NOTE — Discharge Summary (Signed)
[place] [facility] [place] NAME: Gregory Tyler    MR#:  [id number]  DATE OF BIRTH:  May 22, 1940  DATE OF ADMISSION:  04/07/2018 ADMITTING PHYSICIAN: Henreitta Leber, MD  DATE OF DISCHARGE: *04/10/2018  PRIMARY CARE PHYSICIAN: [name], MD    ADMISSION DIAGNOSIS:  Atrial fibrillation with RVR ([place]) [I48.91] Dyspnea, unspecified type [R06.00]  DISCHARGE DIAGNOSIS:  Active Problems:   Atrial fibrillation with RVR ([place])   SECONDARY DIAGNOSIS:   Past Medical History:  Diagnosis Date  . Anemia   . Anxiety   . Arthritis   . Cardiomyopathy ([place])    a. 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  Marland Kitchen Chronic combined systolic (congestive) and diastolic (congestive) heart failure ([place])    a. 05/2015 TEE: EF 35-40% (in setting of Afib); b. 08/2017 Echo: EF 55-60%, no rwma, Gr2DD; b. 01/2018 Echo: EF 55-60%, mild MR. Nl LA size. Nl RV fxn.  . Chronic Pelvic pain   . Coronary artery disease    a. 05/2015 CTA Chest: 3 vessel coronary Ca2+.  . Depression   . Diabetes mellitus without complication ([place])   . Emphysema lung ([place])   . Gastric ulcer   . Gout   . Hyperlipidemia   . Hypertension   . Nephrolithiasis   . PAF (paroxysmal atrial fibrillation) ([place])    a. 05/2015 s/p DCCV; b. 03/2016 s/p DCCV; c. CHA2DS2VASc = 6 (Eliquis).  . Prostate cancer [facility])    Prostate  . Stage III chronic kidney disease ([place])   . Weight loss    50 lb since Apr 24, 2015    HOSPITAL COURSE:   77 year old male with PAF, essential hypertension, chronic combined systolic and diastolic heart failure, chronic kidney disease and severe PAD with chronic foot ulcerations who presented to the emergency room due to shortness of breath.  1.  Atrial fibrillation with RVR: Patient was eval by cardiology.  Recommendations are to discharge the patient on Eliquis for stroke prevention, digoxin, diltiazem and metoprolol for heart rate control.  He will  follow-up with Dr. Rockey Situ in 1 week. Heart rates appear improved.  2.  Diabetes with A1c 7.7 not on any medications prior to admission: Patient will be discharged on oral metformin.  He needs q. before meals and nightly monitoring of blood sugars.  He will need to follow-up with his PCP for titration of medications.  3.  PAD with severe disease: Patient was evaluated by vascular surgery. He is postoperative day #1 Crosser atherectomy of theright anterior tibialartery, Percutaneous transluminal angioplastyright anterior tibialartery with a 2.5balloon with Star close closureleftcommon femoral arteriotomy  He will have outpatient intervention on the left lower extremity. Continue Santyl ointment to right leg wound for enzymatic debridement; pt should follow-up with the outpatient wound care center after discharge for further plan of care.   4.  COPD without signs exacerbation  5.  Hyperlipidemia: Continue Crestor   6.  Elevated TSH: Patient will need repeat TFTs checked as an outpatient.  7.  Chronic kidney disease stage III: Creatinine is at baseline  Discussed with Dr. Rockey Situ  DISCHARGE CONDITIONS AND DIET:   Stable heart healthy diabetic diet  CONSULTS OBTAINED:  Treatment Team:  Wellington Hampshire, MD  DRUG ALLERGIES:   Allergies  Allergen Reactions  . Aspirin Other (See Comments)    GI Bleeding  . Tuberculin Tests Other (See Comments)    unknown  . Codeine Other (See Comments)  BLISTERS     DISCHARGE MEDICATIONS:   Allergies as of 04/10/2018      Reactions   Aspirin Other (See Comments)   GI Bleeding   Tuberculin Tests Other (See Comments)   unknown   Codeine Other (See Comments)   BLISTERS      Medication List    TAKE these medications   acetaminophen 325 MG tablet Commonly known as:  TYLENOL Take 2 tablets (650 mg total) by mouth every 6 (six) hours as needed for mild pain (or Fever >/= 101).   allopurinol 300 MG tablet Commonly known as:   ZYLOPRIM Take 300 mg by mouth daily.   apixaban 5 MG Tabs tablet Commonly known as:  ELIQUIS Take 1 tablet (5 mg total) by mouth 2 (two) times daily. What changed:    medication strength  how much to take  when to take this   ARTIFICIAL TEARS 0.4 % Soln Generic drug:  Hypromellose Place 1 drop into both eyes 2 (two) times daily.   BETADINE EX Apply topically every evening. Apply to left heel and left lateral foot   collagenase ointment Commonly known as:  SANTYL Apply 1 application topically every evening. Apply to eschar on RLE wound   Digoxin 62.5 MCG Tabs Take 0.0625 mg by mouth daily.   diltiazem 30 MG tablet Commonly known as:  CARDIZEM Take 1 tablet (30 mg total) by mouth every 8 (eight) hours.   gabapentin 100 MG capsule Commonly known as:  NEURONTIN Take 1 capsule (100 mg total) by mouth at bedtime.   HYDROcodone-acetaminophen 5-325 MG tablet Commonly known as:  NORCO/VICODIN Take 1 tablet by mouth every 6 (six) hours as needed for moderate pain or severe pain.   metFORMIN 500 MG tablet Commonly known as:  GLUCOPHAGE Take 1 tablet (500 mg total) by mouth daily with breakfast. Start taking on:  April 11, 2018   metoprolol tartrate 25 MG tablet Commonly known as:  LOPRESSOR Take 1 tablet (25 mg total) by mouth 2 (two) times daily.   rosuvastatin 20 MG tablet Commonly known as:  CRESTOR Take 20 mg by mouth at bedtime.   tiotropium 18 MCG inhalation capsule Commonly known as:  SPIRIVA Place 18 mcg into inhaler and inhale daily.   torsemide 20 MG tablet Commonly known as:  DEMADEX Take 20 mg by mouth daily.         Today   CHIEF COMPLAINT:  No acute events overnight.  Patient is postoperative day #1   VITAL SIGNS:  Blood pressure 105/60, pulse (!) 51, temperature 98.4 F (36.9 C), temperature source Oral, resp. rate 18, height 5\' 8"  (1.727 m), weight 65.8 kg, SpO2 97 %.   REVIEW OF SYSTEMS:  Review of Systems  Constitutional:  Negative.  Negative for chills, fever and malaise/fatigue.  HENT: Negative.  Negative for ear discharge, ear pain, hearing loss, nosebleeds and sore throat.   Eyes: Negative.  Negative for blurred vision and pain.  Respiratory: Negative.  Negative for cough, hemoptysis, shortness of breath and wheezing.   Cardiovascular: Negative.  Negative for chest pain, palpitations and leg swelling.  Gastrointestinal: Negative.  Negative for abdominal pain, blood in stool, diarrhea, nausea and vomiting.  Genitourinary: Negative.  Negative for dysuria.  Musculoskeletal: Negative.  Negative for back pain.  Skin:       Chronic wound right leg  Neurological: Negative for dizziness, tremors, speech change, focal weakness, seizures and headaches.  Endo/Heme/Allergies: Negative.  Does not bruise/bleed easily.  Psychiatric/Behavioral: Negative.  Negative  for depression, hallucinations and suicidal ideas.     PHYSICAL EXAMINATION:  GENERAL:  77 y.o.-year-old patient lying in the bed with no acute distress.  NECK:  Supple, no jugular venous distention. No thyroid enlargement, no tenderness.  LUNGS: Normal breath sounds bilaterally, no wheezing, rales,rhonchi  No use of accessory muscles of respiration.  CARDIOVASCULAR: Irregular, irregular. No murmurs, rubs, or gallops.  ABDOMEN: Soft, non-tender, non-distended. Bowel sounds present. No organomegaly or mass.  EXTREMITIES: Right leg is bandaged PSYCHIATRIC: The patient is alert and oriented x 3.  SKIN: No obvious rash, lesion, or ulcer.   DATA REVIEW:   CBC Recent Labs  Lab 04/10/18 0129  WBC 9.5  HGB 9.6*  HCT 30.5*  PLT 249    Chemistries  Recent Labs  Lab 04/07/18 1539 04/08/18 0236 04/09/18 0426  NA 134* 132* 135  K 3.8 4.0 4.2  CL 105 104 105  CO2 21* 20* 24  GLUCOSE 297* 367* 193*  BUN 39* 42* 44*  CREATININE 1.24 1.31* 1.39*  CALCIUM 8.3* 7.8* 8.3*  MG  --  2.0  --   AST 45*  --   --   ALT 54*  --   --   ALKPHOS 104  --   --    BILITOT 0.5  --   --     Cardiac Enzymes Recent Labs  Lab 04/07/18 1539 04/08/18 0236  TROPONINI 0.05* 0.06*    Microbiology Results  @[id number] @  RADIOLOGY:  No results found.    Allergies as of 04/10/2018      Reactions   Aspirin Other (See Comments)   GI Bleeding   Tuberculin Tests Other (See Comments)   unknown   Codeine Other (See Comments)   BLISTERS      Medication List    TAKE these medications   acetaminophen 325 MG tablet Commonly known as:  TYLENOL Take 2 tablets (650 mg total) by mouth every 6 (six) hours as needed for mild pain (or Fever >/= 101).   allopurinol 300 MG tablet Commonly known as:  ZYLOPRIM Take 300 mg by mouth daily.   apixaban 5 MG Tabs tablet Commonly known as:  ELIQUIS Take 1 tablet (5 mg total) by mouth 2 (two) times daily. What changed:    medication strength  how much to take  when to take this   ARTIFICIAL TEARS 0.4 % Soln Generic drug:  Hypromellose Place 1 drop into both eyes 2 (two) times daily.   BETADINE EX Apply topically every evening. Apply to left heel and left lateral foot   collagenase ointment Commonly known as:  SANTYL Apply 1 application topically every evening. Apply to eschar on RLE wound   Digoxin 62.5 MCG Tabs Take 0.0625 mg by mouth daily.   diltiazem 30 MG tablet Commonly known as:  CARDIZEM Take 1 tablet (30 mg total) by mouth every 8 (eight) hours.   gabapentin 100 MG capsule Commonly known as:  NEURONTIN Take 1 capsule (100 mg total) by mouth at bedtime.   HYDROcodone-acetaminophen 5-325 MG tablet Commonly known as:  NORCO/VICODIN Take 1 tablet by mouth every 6 (six) hours as needed for moderate pain or severe pain.   metFORMIN 500 MG tablet Commonly known as:  GLUCOPHAGE Take 1 tablet (500 mg total) by mouth daily with breakfast. Start taking on:  April 11, 2018   metoprolol tartrate 25 MG tablet Commonly known as:  LOPRESSOR Take 1 tablet (25 mg total) by mouth 2 (two)  times daily.   rosuvastatin  20 MG tablet Commonly known as:  CRESTOR Take 20 mg by mouth at bedtime.   tiotropium 18 MCG inhalation capsule Commonly known as:  SPIRIVA Place 18 mcg into inhaler and inhale daily.   torsemide 20 MG tablet Commonly known as:  DEMADEX Take 20 mg by mouth daily.         Management plans discussed with the patient and he is in agreement. Stable for discharge   Patient should follow up with pcp  CODE STATUS:     Code Status Orders  (From admission, onward)         Start     Ordered   04/07/18 1848  Do not attempt resuscitation (DNR)  Continuous    Question Answer Comment  In the event of cardiac or respiratory ARREST Do not call a "code blue"   In the event of cardiac or respiratory ARREST Do not perform Intubation, CPR, defibrillation or ACLS   In the event of cardiac or respiratory ARREST Use medication by any route, position, wound care, and other measures to relive pain and suffering. May use oxygen, suction and manual treatment of airway obstruction as needed for comfort.      04/07/18 1847        Code Status History    Date Active Date Inactive Code Status Order ID Comments User Context   01/20/2018 1242 01/30/2018 2345 DNR [id number]  Knox Royalty, NP Inpatient   01/19/2018 1329 01/20/2018 1242 Full Code [id number]  Gorden Harms, MD Inpatient   01/13/2018 1712 01/19/2018 1329 DNR [id number]  Dustin Flock, MD Inpatient   12/19/2017 1932 12/20/2017 2151 DNR [id number]  [name], MD Inpatient   11/17/2017 1357 11/19/2017 1603 DNR [id number]  Hermelinda Dellen, DO Inpatient   11/15/2017 1550 11/17/2017 1357 Full Code [id number]  Loletha Grayer, MD ED   09/11/2017 1951 09/15/2017 1603 Full Code [id number]  Bettey Costa, MD ED   05/11/2015 1307 05/13/2015 1256 Full Code [id number]  Aldean Jewett, MD Inpatient   04/25/2015 0149 04/27/2015 1741 Full Code [id number]  Hillary Bow, MD ED    Advance Directive Documentation     Most Recent Value   Type of Advance Directive  Out of facility DNR (pink MOST or yellow form)  Pre-existing out of facility DNR order (yellow form or pink MOST form)  Yellow form placed in chart (order not valid for inpatient use)  "MOST" Form in Place?  -      TOTAL TIME TAKING CARE OF THIS PATIENT: 38 minutes.    Note: This dictation was prepared with Dragon dictation along with smaller phrase technology. Any transcriptional errors that result from this process are unintentional.  Lawson Isabell M.D on 04/10/2018 at 12:56 PM  Between 7am to 6pm - Pager - [phone] After 6pm go to [email or web address] - password [email or web address] [facility]  Office  [phone]  CC: Primary care physician; [name], MD

## 2018-04-10 NOTE — Progress Notes (Signed)
[facility]  Daily Progress Note   Subjective: 1 Day Post-Op: Introduction catheter into right lower extremity 3rd order catheter placement, Contrast injection right lower extremity for distal runoff, Crosser atherectomy of the right anterior tibial artery, Percutaneous transluminal angioplasty right anterior tibial artery with a 2.5 balloon with Star close closure left common femoral arteriotomy  Patient complaining of "sore" extremities this AM. No issues overnight.   Objective: Vitals:   04/09/18 2359 04/10/18 0351 04/10/18 0612 04/10/18 0752  BP: 103/65 102/72 109/72 105/60  Pulse: 97 (!) 27 92 (!) 51  Resp:  18  18  Temp:  98 F (36.7 C)  98.4 F (36.9 C)  TempSrc:  Oral  Oral  SpO2:  96%  97%  Weight:  65.8 kg    Height:        Intake/Output Summary (Last 24 hours) at 04/10/2018 1055 Last data filed at 04/10/2018 0500 Gross per 24 hour  Intake 197.54 ml  Output 1502 ml  Net -1304.46 ml   Physical Exam: A&Ox3, NAD CV: Irregularity Irregular Pulmonary: CTA Bilaterally Abdomen: Soft, Nontender, Nondistended Groin: Left - procedure dressing clean, dry and intact.  Vascular:  Right Lower Extremity: Thigh soft, Calf soft, extremity is warm distally to feet.    Laboratory: CBC    Component Value Date/Time   WBC 9.5 04/10/2018 0129   HGB 9.6 (L) 04/10/2018 0129   HGB 12.2 (L) 03/27/2016 1634   HCT 30.5 (L) 04/10/2018 0129   HCT 37.6 03/27/2016 1634   PLT 249 04/10/2018 0129   PLT 243 03/27/2016 1634   BMET    Component Value Date/Time   NA 135 04/09/2018 0426   NA 139 03/27/2016 1634   K 4.2 04/09/2018 0426   CL 105 04/09/2018 0426   CO2 24 04/09/2018 0426   GLUCOSE 193 (H) 04/09/2018 0426   BUN 44 (H) 04/09/2018 0426   BUN 29 (H) 03/27/2016 1634   CREATININE 1.39 (H) 04/09/2018 0426   CALCIUM 8.3 (L) 04/09/2018 0426   GFRNONAA 48 (L) 04/09/2018 0426   GFRAA 56 (L) 04/09/2018 0426   Assessment/Planning: The patient is a 77 year  old male with PAD with ulceration s/p a Introduction catheter into right lower extremity 3rd order catheter placement, Contrast injection right lower extremity for distal runoff, Crosser atherectomy of the right anterior tibial artery, Percutaneous transluminal angioplasty right anterior tibial artery with a 2.5 balloon with Star close closure left common femoral arteriotomy 1) Will plan on intervention of the left lower extremity as an outpatient.  2) From vascular standpoint the patient can be discharged when medically stable.  3) He should follow up at the wound center for continued local wound care 4) Will see as an outpatient in one month - discharge information completed in chart.  Discussed with Tamera Stands PA-C 04/10/2018 10:55 AM

## 2018-04-12 LAB — CULTURE, BLOOD (ROUTINE X 2)
Culture: NO GROWTH
Culture: NO GROWTH
Special Requests: ADEQUATE

## 2018-04-21 ENCOUNTER — Other Ambulatory Visit: Payer: Self-pay

## 2018-04-21 ENCOUNTER — Inpatient Hospital Stay
Admission: EM | Admit: 2018-04-21 | Discharge: 2018-04-28 | DRG: 981 | Disposition: A | Discharge status: Skilled Nursing Facility | Payer: Medicare Other | Source: Ambulatory Visit | Attending: Internal Medicine | Admitting: Internal Medicine

## 2018-04-21 ENCOUNTER — Encounter: Payer: Medicare Other | Admitting: Family Medicine

## 2018-04-21 DIAGNOSIS — E1151 Type 2 diabetes mellitus with diabetic peripheral angiopathy without gangrene: Secondary | ICD-10-CM | POA: Diagnosis present

## 2018-04-21 DIAGNOSIS — F419 Anxiety disorder, unspecified: Secondary | ICD-10-CM | POA: Diagnosis present

## 2018-04-21 DIAGNOSIS — E11622 Type 2 diabetes mellitus with other skin ulcer: Secondary | ICD-10-CM | POA: Diagnosis present

## 2018-04-21 DIAGNOSIS — Z825 Family history of asthma and other chronic lower respiratory diseases: Secondary | ICD-10-CM

## 2018-04-21 DIAGNOSIS — I130 Hypertensive heart and chronic kidney disease with heart failure and stage 1 through stage 4 chronic kidney disease, or unspecified chronic kidney disease: Secondary | ICD-10-CM | POA: Diagnosis present

## 2018-04-21 DIAGNOSIS — L97429 Non-pressure chronic ulcer of left heel and midfoot with unspecified severity: Secondary | ICD-10-CM | POA: Diagnosis not present

## 2018-04-21 DIAGNOSIS — Z8711 Personal history of peptic ulcer disease: Secondary | ICD-10-CM

## 2018-04-21 DIAGNOSIS — Z833 Family history of diabetes mellitus: Secondary | ICD-10-CM

## 2018-04-21 DIAGNOSIS — M1990 Unspecified osteoarthritis, unspecified site: Secondary | ICD-10-CM | POA: Diagnosis present

## 2018-04-21 DIAGNOSIS — Z7401 Bed confinement status: Secondary | ICD-10-CM

## 2018-04-21 DIAGNOSIS — E785 Hyperlipidemia, unspecified: Secondary | ICD-10-CM | POA: Diagnosis not present

## 2018-04-21 DIAGNOSIS — G8929 Other chronic pain: Secondary | ICD-10-CM | POA: Diagnosis present

## 2018-04-21 DIAGNOSIS — L89153 Pressure ulcer of sacral region, stage 3: Secondary | ICD-10-CM | POA: Diagnosis present

## 2018-04-21 DIAGNOSIS — I70234 Atherosclerosis of native arteries of right leg with ulceration of heel and midfoot: Secondary | ICD-10-CM | POA: Diagnosis not present

## 2018-04-21 DIAGNOSIS — I2510 Atherosclerotic heart disease of native coronary artery without angina pectoris: Secondary | ICD-10-CM | POA: Diagnosis present

## 2018-04-21 DIAGNOSIS — I429 Cardiomyopathy, unspecified: Secondary | ICD-10-CM | POA: Diagnosis present

## 2018-04-21 DIAGNOSIS — R102 Pelvic and perineal pain: Secondary | ICD-10-CM | POA: Diagnosis present

## 2018-04-21 DIAGNOSIS — I70238 Atherosclerosis of native arteries of right leg with ulceration of other part of lower right leg: Secondary | ICD-10-CM | POA: Diagnosis present

## 2018-04-21 DIAGNOSIS — N183 Chronic kidney disease, stage 3 (moderate): Secondary | ICD-10-CM | POA: Diagnosis present

## 2018-04-21 DIAGNOSIS — E1122 Type 2 diabetes mellitus with diabetic chronic kidney disease: Secondary | ICD-10-CM | POA: Diagnosis present

## 2018-04-21 DIAGNOSIS — I5042 Chronic combined systolic (congestive) and diastolic (congestive) heart failure: Secondary | ICD-10-CM | POA: Diagnosis present

## 2018-04-21 DIAGNOSIS — Z7901 Long term (current) use of anticoagulants: Secondary | ICD-10-CM

## 2018-04-21 DIAGNOSIS — L089 Local infection of the skin and subcutaneous tissue, unspecified: Secondary | ICD-10-CM | POA: Diagnosis present

## 2018-04-21 DIAGNOSIS — L97829 Non-pressure chronic ulcer of other part of left lower leg with unspecified severity: Secondary | ICD-10-CM | POA: Diagnosis present

## 2018-04-21 DIAGNOSIS — E1140 Type 2 diabetes mellitus with diabetic neuropathy, unspecified: Secondary | ICD-10-CM | POA: Diagnosis present

## 2018-04-21 DIAGNOSIS — I480 Paroxysmal atrial fibrillation: Secondary | ICD-10-CM | POA: Diagnosis present

## 2018-04-21 DIAGNOSIS — L97419 Non-pressure chronic ulcer of right heel and midfoot with unspecified severity: Secondary | ICD-10-CM | POA: Diagnosis not present

## 2018-04-21 DIAGNOSIS — I70232 Atherosclerosis of native arteries of right leg with ulceration of calf: Secondary | ICD-10-CM | POA: Diagnosis not present

## 2018-04-21 DIAGNOSIS — Z8249 Family history of ischemic heart disease and other diseases of the circulatory system: Secondary | ICD-10-CM

## 2018-04-21 DIAGNOSIS — L02415 Cutaneous abscess of right lower limb: Secondary | ICD-10-CM | POA: Diagnosis not present

## 2018-04-21 DIAGNOSIS — I428 Other cardiomyopathies: Secondary | ICD-10-CM | POA: Diagnosis present

## 2018-04-21 DIAGNOSIS — E11621 Type 2 diabetes mellitus with foot ulcer: Secondary | ICD-10-CM | POA: Diagnosis present

## 2018-04-21 DIAGNOSIS — L89611 Pressure ulcer of right heel, stage 1: Secondary | ICD-10-CM | POA: Diagnosis present

## 2018-04-21 DIAGNOSIS — B964 Proteus (mirabilis) (morganii) as the cause of diseases classified elsewhere: Secondary | ICD-10-CM | POA: Diagnosis present

## 2018-04-21 DIAGNOSIS — L97218 Non-pressure chronic ulcer of right calf with other specified severity: Secondary | ICD-10-CM | POA: Diagnosis not present

## 2018-04-21 DIAGNOSIS — Z87442 Personal history of urinary calculi: Secondary | ICD-10-CM

## 2018-04-21 DIAGNOSIS — Z885 Allergy status to narcotic agent status: Secondary | ICD-10-CM

## 2018-04-21 DIAGNOSIS — Z886 Allergy status to analgesic agent status: Secondary | ICD-10-CM

## 2018-04-21 DIAGNOSIS — L97219 Non-pressure chronic ulcer of right calf with unspecified severity: Secondary | ICD-10-CM | POA: Diagnosis present

## 2018-04-21 DIAGNOSIS — F329 Major depressive disorder, single episode, unspecified: Secondary | ICD-10-CM | POA: Diagnosis present

## 2018-04-21 DIAGNOSIS — I4891 Unspecified atrial fibrillation: Secondary | ICD-10-CM | POA: Diagnosis not present

## 2018-04-21 DIAGNOSIS — Z87891 Personal history of nicotine dependence: Secondary | ICD-10-CM

## 2018-04-21 DIAGNOSIS — M109 Gout, unspecified: Secondary | ICD-10-CM | POA: Diagnosis present

## 2018-04-21 DIAGNOSIS — L03115 Cellulitis of right lower limb: Principal | ICD-10-CM | POA: Diagnosis present

## 2018-04-21 DIAGNOSIS — Z9884 Bariatric surgery status: Secondary | ICD-10-CM

## 2018-04-21 DIAGNOSIS — T148XXA Other injury of unspecified body region, initial encounter: Secondary | ICD-10-CM

## 2018-04-21 DIAGNOSIS — I70248 Atherosclerosis of native arteries of left leg with ulceration of other part of lower left leg: Secondary | ICD-10-CM | POA: Diagnosis present

## 2018-04-21 DIAGNOSIS — Z9862 Peripheral vascular angioplasty status: Secondary | ICD-10-CM

## 2018-04-21 DIAGNOSIS — J439 Emphysema, unspecified: Secondary | ICD-10-CM | POA: Diagnosis present

## 2018-04-21 DIAGNOSIS — Z9049 Acquired absence of other specified parts of digestive tract: Secondary | ICD-10-CM

## 2018-04-21 DIAGNOSIS — K219 Gastro-esophageal reflux disease without esophagitis: Secondary | ICD-10-CM | POA: Diagnosis present

## 2018-04-21 DIAGNOSIS — Z48817 Encounter for surgical aftercare following surgery on the skin and subcutaneous tissue: Secondary | ICD-10-CM | POA: Diagnosis not present

## 2018-04-21 DIAGNOSIS — I70244 Atherosclerosis of native arteries of left leg with ulceration of heel and midfoot: Secondary | ICD-10-CM | POA: Diagnosis not present

## 2018-04-21 DIAGNOSIS — Z8546 Personal history of malignant neoplasm of prostate: Secondary | ICD-10-CM

## 2018-04-21 DIAGNOSIS — Z823 Family history of stroke: Secondary | ICD-10-CM

## 2018-04-21 LAB — CBC WITH DIFFERENTIAL/PLATELET
ABS IMMATURE GRANULOCYTES: 0.09 10*3/uL — AB (ref 0.00–0.07)
Basophils Absolute: 0 10*3/uL (ref 0.0–0.1)
Basophils Relative: 0 %
Eosinophils Absolute: 0 10*3/uL (ref 0.0–0.5)
Eosinophils Relative: 0 %
HCT: 34.8 % — ABNORMAL LOW (ref 39.0–52.0)
Hemoglobin: 10.7 g/dL — ABNORMAL LOW (ref 13.0–17.0)
Immature Granulocytes: 1 %
LYMPHS ABS: 1.3 10*3/uL (ref 0.7–4.0)
Lymphocytes Relative: 10 %
MCH: 28.8 pg (ref 26.0–34.0)
MCHC: 30.7 g/dL (ref 30.0–36.0)
MCV: 93.5 fL (ref 80.0–100.0)
Monocytes Absolute: 0.8 10*3/uL (ref 0.1–1.0)
Monocytes Relative: 6 %
Neutro Abs: 10.8 10*3/uL — ABNORMAL HIGH (ref 1.7–7.7)
Neutrophils Relative %: 83 %
Platelets: 333 10*3/uL (ref 150–400)
RBC: 3.72 MIL/uL — ABNORMAL LOW (ref 4.22–5.81)
RDW: 15.1 % (ref 11.5–15.5)
WBC: 13 10*3/uL — ABNORMAL HIGH (ref 4.0–10.5)
nRBC: 0 % (ref 0.0–0.2)

## 2018-04-21 LAB — COMPREHENSIVE METABOLIC PANEL
ALT: 72 U/L — ABNORMAL HIGH (ref 0–44)
AST: 94 U/L — ABNORMAL HIGH (ref 15–41)
Albumin: 2.7 g/dL — ABNORMAL LOW (ref 3.5–5.0)
Alkaline Phosphatase: 118 U/L (ref 38–126)
Anion gap: 10 (ref 5–15)
BUN: 41 mg/dL — ABNORMAL HIGH (ref 8–23)
CO2: 23 mmol/L (ref 22–32)
Calcium: 8.3 mg/dL — ABNORMAL LOW (ref 8.9–10.3)
Chloride: 101 mmol/L (ref 98–111)
Creatinine, Ser: 1.49 mg/dL — ABNORMAL HIGH (ref 0.61–1.24)
GFR calc Af Amer: 51 mL/min — ABNORMAL LOW (ref >60)
GFR calc non Af Amer: 44 mL/min — ABNORMAL LOW (ref >60)
Glucose, Bld: 227 mg/dL — ABNORMAL HIGH (ref 70–99)
POTASSIUM: 4 mmol/L (ref 3.5–5.1)
Sodium: 134 mmol/L — ABNORMAL LOW (ref 135–145)
Total Bilirubin: 0.6 mg/dL (ref 0.3–1.2)
Total Protein: 6.9 g/dL (ref 6.5–8.1)

## 2018-04-21 LAB — CG4 I-STAT (LACTIC ACID): LACTIC ACID, VENOUS: 0.97 mmol/L (ref 0.5–1.9)

## 2018-04-21 LAB — MRSA PCR SCREENING: MRSA by PCR: NEGATIVE

## 2018-04-21 LAB — GLUCOSE, CAPILLARY: Glucose-Capillary: 293 mg/dL — ABNORMAL HIGH (ref 70–99)

## 2018-04-21 LAB — SEDIMENTATION RATE: Sed Rate: 111 mm/hr — ABNORMAL HIGH (ref 0–20)

## 2018-04-21 MED ORDER — INSULIN ASPART 100 UNIT/ML ~~LOC~~ SOLN
0.0000 [IU] | Freq: Every day | SUBCUTANEOUS | Status: DC
  Administered 2018-04-21 – 2018-04-24 (×3): 3 [IU] via SUBCUTANEOUS
  Administered 2018-04-25: 4 [IU] via SUBCUTANEOUS
  Administered 2018-04-26: 2 [IU] via SUBCUTANEOUS
  Administered 2018-04-27: 5 [IU] via SUBCUTANEOUS
  Filled 2018-04-21 (×7): qty 1

## 2018-04-21 MED ORDER — APIXABAN 5 MG PO TABS
5.0000 mg | ORAL_TABLET | Freq: Two times a day (BID) | ORAL | Status: DC
  Administered 2018-04-21 – 2018-04-22 (×2): 5 mg via ORAL
  Filled 2018-04-21 (×2): qty 1

## 2018-04-21 MED ORDER — POLYVINYL ALCOHOL 1.4 % OP SOLN
1.0000 [drp] | Freq: Two times a day (BID) | OPHTHALMIC | Status: DC
  Administered 2018-04-21 – 2018-04-28 (×12): 1 [drp] via OPHTHALMIC
  Filled 2018-04-21: qty 15

## 2018-04-21 MED ORDER — TIOTROPIUM BROMIDE MONOHYDRATE 18 MCG IN CAPS
18.0000 ug | ORAL_CAPSULE | Freq: Every day | RESPIRATORY_TRACT | Status: DC
  Administered 2018-04-21 – 2018-04-28 (×7): 18 ug via RESPIRATORY_TRACT
  Filled 2018-04-21 (×2): qty 5

## 2018-04-21 MED ORDER — METOPROLOL TARTRATE 25 MG PO TABS
25.0000 mg | ORAL_TABLET | Freq: Two times a day (BID) | ORAL | Status: DC
  Administered 2018-04-21 – 2018-04-28 (×9): 25 mg via ORAL
  Filled 2018-04-21 (×14): qty 1

## 2018-04-21 MED ORDER — COLLAGENASE 250 UNIT/GM EX OINT
1.0000 "application " | TOPICAL_OINTMENT | Freq: Every evening | CUTANEOUS | Status: DC
  Administered 2018-04-21: 1 via TOPICAL
  Filled 2018-04-21: qty 30

## 2018-04-21 MED ORDER — ACETAMINOPHEN 325 MG PO TABS: 650.0000 mg | ORAL_TABLET | Freq: Four times a day (QID) | ORAL | Status: DC | PRN

## 2018-04-21 MED ORDER — VANCOMYCIN HCL IN DEXTROSE 1-5 GM/200ML-% IV SOLN
1000.0000 mg | Freq: Once | INTRAVENOUS | Status: AC
  Administered 2018-04-21: 1000 mg via INTRAVENOUS
  Filled 2018-04-21: qty 200

## 2018-04-21 MED ORDER — SODIUM CHLORIDE 0.9 % IV SOLN
1.0000 g | Freq: Two times a day (BID) | INTRAVENOUS | Status: DC
  Administered 2018-04-21 – 2018-04-26 (×10): 1 g via INTRAVENOUS
  Filled 2018-04-21 (×12): qty 1

## 2018-04-21 MED ORDER — POVIDONE-IODINE 10 % EX SWAB
2.0000 "application " | Freq: Every evening | CUTANEOUS | Status: DC
  Administered 2018-04-23 – 2018-04-24 (×2): 2 via TOPICAL

## 2018-04-21 MED ORDER — INSULIN ASPART 100 UNIT/ML ~~LOC~~ SOLN
0.0000 [IU] | Freq: Three times a day (TID) | SUBCUTANEOUS | Status: DC
  Administered 2018-04-22 – 2018-04-23 (×2): 3 [IU] via SUBCUTANEOUS
  Administered 2018-04-23 – 2018-04-24 (×3): 2 [IU] via SUBCUTANEOUS
  Administered 2018-04-24: 1 [IU] via SUBCUTANEOUS
  Administered 2018-04-24: 18:00:00 3 [IU] via SUBCUTANEOUS
  Administered 2018-04-25: 14:00:00 1 [IU] via SUBCUTANEOUS
  Administered 2018-04-25 (×2): 2 [IU] via SUBCUTANEOUS
  Administered 2018-04-26: 12:00:00 3 [IU] via SUBCUTANEOUS
  Administered 2018-04-26: 5 [IU] via SUBCUTANEOUS
  Administered 2018-04-26: 1 [IU] via SUBCUTANEOUS
  Administered 2018-04-27: 18:00:00 5 [IU] via SUBCUTANEOUS
  Administered 2018-04-27 – 2018-04-28 (×2): 2 [IU] via SUBCUTANEOUS
  Administered 2018-04-28: 13:00:00 5 [IU] via SUBCUTANEOUS
  Filled 2018-04-21 (×16): qty 1

## 2018-04-21 MED ORDER — DIGOXIN 125 MCG PO TABS
0.0625 mg | ORAL_TABLET | Freq: Every day | ORAL | Status: DC
  Administered 2018-04-22 – 2018-04-28 (×7): 0.0625 mg via ORAL
  Filled 2018-04-21 (×7): qty 0.5

## 2018-04-21 MED ORDER — ROSUVASTATIN CALCIUM 20 MG PO TABS
20.0000 mg | ORAL_TABLET | Freq: Every day | ORAL | Status: DC
  Administered 2018-04-21 – 2018-04-27 (×7): 20 mg via ORAL
  Filled 2018-04-21 (×8): qty 1

## 2018-04-21 MED ORDER — GABAPENTIN 100 MG PO CAPS
100.0000 mg | ORAL_CAPSULE | Freq: Every day | ORAL | Status: DC
  Administered 2018-04-21 – 2018-04-27 (×7): 100 mg via ORAL
  Filled 2018-04-21 (×8): qty 1

## 2018-04-21 MED ORDER — HYDROCODONE-ACETAMINOPHEN 5-325 MG PO TABS
1.0000 | ORAL_TABLET | Freq: Four times a day (QID) | ORAL | Status: DC | PRN
  Administered 2018-04-21 – 2018-04-22 (×2): 1 via ORAL
  Filled 2018-04-21 (×2): qty 1

## 2018-04-21 MED ORDER — DILTIAZEM HCL 30 MG PO TABS
30.0000 mg | ORAL_TABLET | Freq: Three times a day (TID) | ORAL | Status: DC
  Administered 2018-04-21 – 2018-04-27 (×10): 30 mg via ORAL
  Filled 2018-04-21 (×16): qty 1

## 2018-04-21 MED ORDER — ALLOPURINOL 300 MG PO TABS
300.0000 mg | ORAL_TABLET | Freq: Every day | ORAL | Status: DC
  Administered 2018-04-22 – 2018-04-28 (×7): 300 mg via ORAL
  Filled 2018-04-21 (×7): qty 1

## 2018-04-21 MED ORDER — VANCOMYCIN HCL IN DEXTROSE 1-5 GM/200ML-% IV SOLN
1000.0000 mg | INTRAVENOUS | Status: DC
  Administered 2018-04-22 – 2018-04-24 (×3): 1000 mg via INTRAVENOUS
  Filled 2018-04-21 (×4): qty 200

## 2018-04-21 NOTE — ED Provider Notes (Signed)
[facility] Emergency Department Provider Note  Time seen: 5:52 PM  I have reviewed the triage vital signs and the nursing notes.   HISTORY  Chief Complaint Wound Infection    HPI Gregory Tyler is a 77 y.o. male with a past medical history of anemia, anxiety, CHF, CAD, PAD, diabetes, hypertension, hyperlipidemia, presents to the emergency department for worsening lower extremity ulcerations and concerns for active infection.  According to the patient record review for approximately 1 year the patient has had ulcerations to bilateral lower extremities.  Approximately 2 to 3 weeks ago patient had reperfusion procedure performed by vascular surgery in an attempt to increase blood flow to the bilateral lower extremities.  Over the past 2 weeks the patient has had significant worsening of the ulcerations, they have become much larger and foul-smelling.  Was seen by wound care and was sent to the emergency department for evaluation.  Patient denies any fevers.  Denies any increased pain to the areas.  Although does state they are painful.   Past Medical History:  Diagnosis Date  . Anemia   . Anxiety   . Arthritis   . Cardiomyopathy ([place])    a. 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  Marland Kitchen Chronic combined systolic (congestive) and diastolic (congestive) heart failure ([place])    a. 05/2015 TEE: EF 35-40% (in setting of Afib); b. 08/2017 Echo: EF 55-60%, no rwma, Gr2DD; b. 01/2018 Echo: EF 55-60%, mild MR. Nl LA size. Nl RV fxn.  . Chronic Pelvic pain   . Coronary artery disease    a. 05/2015 CTA Chest: 3 vessel coronary Ca2+.  . Depression   . Diabetes mellitus without complication ([place])   . Emphysema lung ([place])   . Gastric ulcer   . Gout   . Hyperlipidemia   . Hypertension   . Nephrolithiasis   . PAF (paroxysmal atrial fibrillation) ([place])    a. 05/2015 s/p DCCV; b. 03/2016 s/p DCCV; c. CHA2DS2VASc = 6 (Eliquis).  . Prostate cancer [facility])     Prostate  . Stage III chronic kidney disease ([place])   . Weight loss    50 lb since Apr 24, 2015    Patient Active Problem List   Diagnosis Date Noted  . Atrial fibrillation with RVR ([place]) 04/07/2018  . Protein-calorie malnutrition, severe 01/21/2018  . Acute renal failure superimposed on chronic kidney disease ([place])   . Palliative care by specialist   . DNR (do not resuscitate)   . Sepsis ([place]) 01/13/2018  . Atherosclerotic PVD with ulceration ([place]) 01/09/2018  . Diarrhea 12/19/2017  . Pressure injury of skin 11/16/2017  . Enteritis 11/15/2017  . Malnutrition of moderate degree 09/13/2017  . Elevated INR 09/02/2017  . Paroxysmal atrial fibrillation ([place]) 10/25/2016  . Weakness generalized   . Hyperlipidemia 08/08/2015  . Diabetes ([place]) 05/19/2015  . Hyperkalemia   . Dehydration   . Orthostatic hypotension 05/09/2015  . Congestive dilated cardiomyopathy ([place]) 05/09/2015  . Polypharmacy 04/27/2015  . Chronic diastolic CHF (congestive heart failure) ([place]) 04/25/2015  . SOB (shortness of breath)   . Coronary artery disease involving native coronary artery of native heart without angina pectoris   . Essential hypertension     Past Surgical History:  Procedure Laterality Date  . APPENDECTOMY    . BACK SURGERY    . CHOLECYSTECTOMY    . DIALYSIS/PERMA CATHETER INSERTION N/A 01/22/2018   Procedure: DIALYSIS/PERMA CATHETER INSERTION;  Surgeon: Katha Cabal, MD;  Location: [place] [facility];  Service: Cardiovascular;  Laterality: N/A;  . DIALYSIS/PERMA CATHETER REMOVAL N/A 04/01/2018   Procedure: DIALYSIS/PERMA CATHETER REMOVAL;  Surgeon: Katha Cabal, MD;  Location: [place] [facility];  Service: Cardiovascular;  Laterality: N/A;  . ELECTROPHYSIOLOGIC STUDY N/A 06/17/2015   Procedure: CARDIOVERSION;  Surgeon: Wellington Hampshire, MD;  Location: [facility] ORS;  Service: Cardiovascular;  Laterality: N/A;  . ELECTROPHYSIOLOGIC STUDY N/A 03/30/2016   Procedure: CARDIOVERSION;   Surgeon: Minna Merritts, MD;  Location: [facility] ORS;  Service: Cardiovascular;  Laterality: N/A;  . GASTRIC BYPASS    . HEMORRHOID SURGERY    . LOWER EXTREMITY ANGIOGRAPHY Right 01/23/2018   Procedure: LOWER EXTREMITY ANGIOGRAPHY;  Surgeon: Algernon Huxley, MD;  Location: [place] [facility];  Service: Cardiovascular;  Laterality: Right;  . LOWER EXTREMITY ANGIOGRAPHY Left 01/30/2018   Procedure: LOWER EXTREMITY ANGIOGRAPHY;  Surgeon: Algernon Huxley, MD;  Location: [place] [facility];  Service: Cardiovascular;  Laterality: Left;  . PERIPHERAL VASCULAR BALLOON ANGIOPLASTY Right 04/09/2018   Procedure: PERIPHERAL VASCULAR BALLOON ANGIOPLASTY;  Surgeon: Katha Cabal, MD;  Location: [place] [facility];  Service: Cardiovascular;  Laterality: Right;  . pilonidal cyst    . TEE WITHOUT CARDIOVERSION N/A 06/17/2015   Procedure: TRANSESOPHAGEAL ECHOCARDIOGRAM (TEE);  Surgeon: Wellington Hampshire, MD;  Location: [facility] ORS;  Service: Cardiovascular;  Laterality: N/A;    Prior to Admission medications   Medication Sig Start Date End Date Taking? Authorizing Provider  acetaminophen (TYLENOL) 325 MG tablet Take 2 tablets (650 mg total) by mouth every 6 (six) hours as needed for mild pain (or Fever >/= 101). 09/15/17   Loletha Grayer, MD  allopurinol (ZYLOPRIM) 300 MG tablet Take 300 mg by mouth daily.     [provider]  apixaban (ELIQUIS) 5 MG TABS tablet Take 1 tablet (5 mg total) by mouth 2 (two) times daily. 04/10/18   Bettey Costa, MD  collagenase (SANTYL) ointment Apply 1 application topically every evening. Apply to eschar on RLE wound    [provider]  digoxin 62.5 MCG TABS Take 0.0625 mg by mouth daily. 04/10/18   Bettey Costa, MD  diltiazem (CARDIZEM) 30 MG tablet Take 1 tablet (30 mg total) by mouth every 8 (eight) hours. 04/10/18   Bettey Costa, MD  gabapentin (NEURONTIN) 100 MG capsule Take 1 capsule (100 mg total) by mouth at bedtime. 09/15/17   Loletha Grayer, MD   HYDROcodone-acetaminophen (NORCO/VICODIN) 5-325 MG tablet Take 1 tablet by mouth every 6 (six) hours as needed for moderate pain or severe pain. 01/29/18   Pyreddy, Reatha Harps, MD  Hypromellose (ARTIFICIAL TEARS) 0.4 % SOLN Place 1 drop into both eyes 2 (two) times daily.    [provider]  metFORMIN (GLUCOPHAGE) 500 MG tablet Take 1 tablet (500 mg total) by mouth daily with breakfast. 04/11/18   Bettey Costa, MD  metoprolol tartrate (LOPRESSOR) 25 MG tablet Take 1 tablet (25 mg total) by mouth 2 (two) times daily. 04/10/18   Bettey Costa, MD  Povidone-Iodine (BETADINE EX) Apply topically every evening. Apply to left heel and left lateral foot    [provider]  rosuvastatin (CRESTOR) 20 MG tablet Take 20 mg by mouth at bedtime.     [provider]  tiotropium (SPIRIVA) 18 MCG inhalation capsule Place 18 mcg into inhaler and inhale daily.     [provider]  torsemide (DEMADEX) 20 MG tablet Take 20 mg by mouth daily.    [provider]  Allergies  Allergen Reactions  . Aspirin Other (See Comments)    GI Bleeding  . Tuberculin Tests Other (See Comments)    unknown  . Codeine Other (See Comments)    BLISTERS     Family History  Problem Relation Age of Onset  . CAD Mother   . Stroke Mother   . COPD Mother   . Heart failure Mother   . Diabetes Other     Social History Social History   Tobacco Use  . Smoking status: Former Smoker    Last attempt to quit: 12/19/1940    Years since quitting: 77.3  . Smokeless tobacco: Former Systems developer    Types: Chew    Quit date: 11/19/2014  Substance Use Topics  . Alcohol use: No  . Drug use: No    Review of Systems Constitutional: Negative for fever. Cardiovascular: Negative for chest pain. Respiratory: Negative for shortness of breath. Gastrointestinal: Negative for abdominal pain, vomiting  Genitourinary: Negative for urinary compaints Musculoskeletal: Ulcerations to bilateral lower extremities.   Tender to palpation. Skin: Ulcerations to bilateral lower extremities. Neurological: Negative for headache All other ROS negative  ____________________________________________   PHYSICAL EXAM:  VITAL SIGNS: ED Triage Vitals  Enc Vitals Group     BP 04/21/18 1600 105/84     Pulse Rate 04/21/18 1600 94     Resp 04/21/18 1600 16     Temp 04/21/18 1610 98 F (36.7 C)     Temp Source 04/21/18 1610 Oral     SpO2 04/21/18 1600 100 %     Weight 04/21/18 1611 160 lb (72.6 kg)     Height 04/21/18 1611 5\' 8"  (1.727 m)     Head Circumference --      Peak Flow --      Pain Score 04/21/18 1611 10     Pain Loc --      Pain Edu? --      Excl. in [place]? --    Constitutional: Alert and oriented. Well appearing and in no distress. Eyes: Normal exam ENT   Head: Normocephalic and atraumatic   Mouth/Throat: Mucous membranes are moist. Cardiovascular: Normal rate, regular rhythm. Respiratory: Normal respiratory effort without tachypnea nor retractions. Breath sounds are clear  Gastrointestinal: Soft and nontender. No distention.   Musculoskeletal: Patient has small ulcerations to bilateral heels of his feet.  Approximately 2 cm in diameter each.  Patient has a much larger ulceration to the right mid calf approximately 10 cm in diameter which has become very painful, surrounded by mild amount of erythema. Neurologic:  Normal speech and language. No gross focal neurologic deficits  Skin:  Skin is warm.  Ulcerations as described above. Psychiatric: Mood and affect are normal.  ____________________________________________   INITIAL IMPRESSION / ASSESSMENT AND PLAN / ED COURSE  Pertinent labs & imaging results that were available during my care of the patient were reviewed by me and considered in my medical decision making (see chart for details).  Patient presents to the emergency department for worsening lower extremity ulcerations.  Patient found to have an elevated white blood cell  count, otherwise reassuring vitals.  Given the worsening ulceration is now foul-smelling, appear much larger than they had previously (pictures were sent), we will start the patient on IV vancomycin.  I have sent blood cultures.  We will admit to the hospital service for further treatment.  Vascular surgery aware of patient's admittance, as this could be possibly related to reperfusion injury.  ____________________________________________   FINAL  CLINICAL IMPRESSION(S) / ED DIAGNOSES  Chronic ulcerations Cellulitis   Harvest Dark, MD 04/21/18 1807

## 2018-04-21 NOTE — Consult Note (Signed)
Pharmacy Antibiotic Note  Gregory Tyler is a 77 y.o. male admitted on 04/21/2018 with wound infection.  Pharmacy has been consulted for Vancomycin/Cefepime dosing.  Plan: Cefepime 1g q 12hours (renally adjusted from 2g q12h)  Vancomycin 1g IV every 24 hours.  Goal trough 10-15 mcg/mL.  Pt received 1g dose @ ~ 1800 12/30 - Stacked dosing @ 12 hours  Ke=0.037  t 1/2 = 18.73  Vd = 50.82  VT will be checked prior to the 4th scheduled dose @ 0530 on 04/25/18   Height: 5\' 8"  (172.7 cm) Weight: 160 lb (72.6 kg) IBW/kg (Calculated) : 68.4  Temp (24hrs), Avg:98 F (36.7 C), Min:98 F (36.7 C), Max:98 F (36.7 C)  Recent Labs  Lab 04/21/18 1619  WBC 13.0*  CREATININE 1.49*    Estimated Creatinine Clearance: 39.5 mL/min (A) (by C-G formula based on SCr of 1.49 mg/dL (H)).    Allergies  Allergen Reactions  . Aspirin Other (See Comments)    GI Bleeding  . Tuberculin Tests Other (See Comments)    unknown  . Codeine Other (See Comments)    BLISTERS     Antimicrobials this admission: Vancomycin 12/30 >>  Cefepime 12/30 >>   Dose adjustments this admission: N/A  Microbiology results: 12/30 BCx: pending   Thank you for allowing pharmacy to be a part of this patient's care.  Lu Duffel, PharmD, BCPS Clinical Pharmacist 04/21/2018 7:42 PM

## 2018-04-21 NOTE — H&P (Signed)
[place] [facility] [place] NAME: Gregory Tyler    MR#:  [id number]  DATE OF BIRTH:  [birth year]  DATE OF ADMISSION:  04/21/2018  PRIMARY CARE PHYSICIAN: [name], MD   REQUESTING/REFERRING PHYSICIAN: Dr Harvest Dark  CHIEF COMPLAINT:   Chief Complaint  Patient presents with  . Wound Infection    HISTORY OF PRESENT ILLNESS:  Gregory Tyler  is a 77 y.o. male atrial fibrillation on Eliquis and chronic wounds of his lower extremities.  The patient does not walk and he is at long-term care.  He has bilateral heel ulcerations and a right calf large ulcer that he has been following at the wound care center for.  The wound care center referred him into the hospital for further evaluation.  The ulcers have become larger and foul-smelling.  The patient states that he has pain very severe in his right calf.  He states the heels also are very tender.  Hospitalist services were contacted for further evaluation.  The patient recently had a revascularization procedure a few weeks ago by vascular surgery.  ER physician contacted Dr. Delana Meyer to see the patient in evaluation.  PAST MEDICAL HISTORY:   Past Medical History:  Diagnosis Date  . Anemia   . Anxiety   . Arthritis   . Cardiomyopathy ([place])    a. 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  Marland Kitchen Chronic combined systolic (congestive) and diastolic (congestive) heart failure ([place])    a. 05/2015 TEE: EF 35-40% (in setting of Afib); b. 08/2017 Echo: EF 55-60%, no rwma, Gr2DD; b. 01/2018 Echo: EF 55-60%, mild MR. Nl LA size. Nl RV fxn.  . Chronic Pelvic pain   . Coronary artery disease    a. 05/2015 CTA Chest: 3 vessel coronary Ca2+.  . Depression   . Diabetes mellitus without complication ([place])   . Emphysema lung ([place])   . Gastric ulcer   . Gout   . Hyperlipidemia   . Hypertension   . Nephrolithiasis   . PAF (paroxysmal atrial fibrillation) ([place])    a. 05/2015 s/p  DCCV; b. 03/2016 s/p DCCV; c. CHA2DS2VASc = 6 (Eliquis).  . Prostate cancer [facility])    Prostate  . Stage III chronic kidney disease ([place])   . Weight loss    50 lb since Apr 24, 2015    PAST SURGICAL HISTORY:   Past Surgical History:  Procedure Laterality Date  . APPENDECTOMY    . BACK SURGERY    . CHOLECYSTECTOMY    . DIALYSIS/PERMA CATHETER INSERTION N/A 01/22/2018   Procedure: DIALYSIS/PERMA CATHETER INSERTION;  Surgeon: Katha Cabal, MD;  Location: [place] [facility];  Service: Cardiovascular;  Laterality: N/A;  . DIALYSIS/PERMA CATHETER REMOVAL N/A 04/01/2018   Procedure: DIALYSIS/PERMA CATHETER REMOVAL;  Surgeon: Katha Cabal, MD;  Location: [place] [facility];  Service: Cardiovascular;  Laterality: N/A;  . ELECTROPHYSIOLOGIC STUDY N/A 06/17/2015   Procedure: CARDIOVERSION;  Surgeon: Wellington Hampshire, MD;  Location: [facility] ORS;  Service: Cardiovascular;  Laterality: N/A;  . ELECTROPHYSIOLOGIC STUDY N/A 03/30/2016   Procedure: CARDIOVERSION;  Surgeon: Minna Merritts, MD;  Location: [facility] ORS;  Service: Cardiovascular;  Laterality: N/A;  . GASTRIC BYPASS    . HEMORRHOID SURGERY    . LOWER EXTREMITY ANGIOGRAPHY Right 01/23/2018   Procedure: LOWER EXTREMITY ANGIOGRAPHY;  Surgeon: Algernon Huxley, MD;  Location: [place] [facility];  Service: Cardiovascular;  Laterality: Right;  . LOWER EXTREMITY  ANGIOGRAPHY Left 01/30/2018   Procedure: LOWER EXTREMITY ANGIOGRAPHY;  Surgeon: Algernon Huxley, MD;  Location: [place] [facility];  Service: Cardiovascular;  Laterality: Left;  . PERIPHERAL VASCULAR BALLOON ANGIOPLASTY Right 04/09/2018   Procedure: PERIPHERAL VASCULAR BALLOON ANGIOPLASTY;  Surgeon: Katha Cabal, MD;  Location: [place] [facility];  Service: Cardiovascular;  Laterality: Right;  . pilonidal cyst    . TEE WITHOUT CARDIOVERSION N/A 06/17/2015   Procedure: TRANSESOPHAGEAL ECHOCARDIOGRAM (TEE);  Surgeon: Wellington Hampshire, MD;  Location: [facility] ORS;  Service:  Cardiovascular;  Laterality: N/A;    SOCIAL HISTORY:   Social History   Tobacco Use  . Smoking status: Former Smoker    Last attempt to quit: 12/19/1940    Years since quitting: 77.3  . Smokeless tobacco: Former Systems developer    Types: Chew    Quit date: 11/19/2014  Substance Use Topics  . Alcohol use: No    FAMILY HISTORY:   Family History  Problem Relation Age of Onset  . CAD Mother   . Stroke Mother   . COPD Mother   . Heart failure Mother   . Diabetes Other     DRUG ALLERGIES:   Allergies  Allergen Reactions  . Aspirin Other (See Comments)    GI Bleeding  . Tuberculin Tests Other (See Comments)    unknown  . Codeine Other (See Comments)    BLISTERS     REVIEW OF SYSTEMS:  CONSTITUTIONAL: No fever, fatigue or weakness.  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 No sore throat.  Decreased hearing RESPIRATORY: No cough.  Positive for shortness of breath.  No wheezing or hemoptysis.  CARDIOVASCULAR: No chest pain, orthopnea, edema.  GASTROINTESTINAL: No nausea, vomiting, diarrhea or abdominal pain. No blood in bowel movements GENITOURINARY: No dysuria, hematuria.  ENDOCRINE: No polyuria, nocturia,  HEMATOLOGY: No anemia, easy bruising or bleeding SKIN: No rash or lesion. MUSCULOSKELETAL: Pain right lower extremity and bilateral heels NEUROLOGIC: No tingling, numbness, weakness.  PSYCHIATRY: Some depression.   MEDICATIONS AT HOME:   Prior to Admission medications   Medication Sig Start Date End Date Taking? Authorizing Provider  acetaminophen (TYLENOL) 325 MG tablet Take 2 tablets (650 mg total) by mouth every 6 (six) hours as needed for mild pain (or Fever >/= 101). 09/15/17   Loletha Grayer, MD  allopurinol (ZYLOPRIM) 300 MG tablet Take 300 mg by mouth daily.     [provider]  apixaban (ELIQUIS) 5 MG TABS tablet Take 1 tablet (5 mg total) by mouth 2 (two) times daily. 04/10/18   Bettey Costa, MD   collagenase (SANTYL) ointment Apply 1 application topically every evening. Apply to eschar on RLE wound    [provider]  digoxin 62.5 MCG TABS Take 0.0625 mg by mouth daily. 04/10/18   Bettey Costa, MD  diltiazem (CARDIZEM) 30 MG tablet Take 1 tablet (30 mg total) by mouth every 8 (eight) hours. 04/10/18   Bettey Costa, MD  gabapentin (NEURONTIN) 100 MG capsule Take 1 capsule (100 mg total) by mouth at bedtime. 09/15/17   Loletha Grayer, MD  HYDROcodone-acetaminophen (NORCO/VICODIN) 5-325 MG tablet Take 1 tablet by mouth every 6 (six) hours as needed for moderate pain or severe pain. 01/29/18   Pyreddy, Reatha Harps, MD  Hypromellose (ARTIFICIAL TEARS) 0.4 % SOLN Place 1 drop into both eyes 2 (two) times daily.    [provider]  metFORMIN (GLUCOPHAGE) 500 MG tablet Take 1 tablet (500  mg total) by mouth daily with breakfast. 04/11/18   Bettey Costa, MD  metoprolol tartrate (LOPRESSOR) 25 MG tablet Take 1 tablet (25 mg total) by mouth 2 (two) times daily. 04/10/18   Bettey Costa, MD  Povidone-Iodine (BETADINE EX) Apply topically every evening. Apply to left heel and left lateral foot    [provider]  rosuvastatin (CRESTOR) 20 MG tablet Take 20 mg by mouth at bedtime.     [provider]  tiotropium (SPIRIVA) 18 MCG inhalation capsule Place 18 mcg into inhaler and inhale daily.     [provider]  torsemide (DEMADEX) 20 MG tablet Take 20 mg by mouth daily.    [provider]      VITAL SIGNS:  Blood pressure 116/71, pulse 91, temperature 98 F (36.7 C), temperature source Oral, resp. rate (!) 22, height 5\' 8"  (1.727 m), weight 72.6 kg, SpO2 100 %.  PHYSICAL EXAMINATION:  GENERAL:  77 y.o.-year-old patient lying in the bed with no acute distress.  EYES: Pupils equal, round, reactive to light and accommodation. No scleral icterus. Extraocular muscles intact.  HEENT: Head atraumatic, normocephalic. Oropharynx and nasopharynx clear.  NECK:   Supple, no jugular venous distention. No thyroid enlargement, no tenderness.  LUNGS: Normal breath sounds bilaterally, no wheezing, rales,rhonchi or crepitation. No use of accessory muscles of respiration.  CARDIOVASCULAR: S1, S2 irregularly irregular. No murmurs, rubs, or gallops.  ABDOMEN: Soft, nontender, nondistended. Bowel sounds present. No organomegaly or mass.  EXTREMITIES: No pedal edema, cyanosis, or clubbing.  NEUROLOGIC: Cranial nerves II through XII are intact. Muscle strength 5/5 in all extremities. Sensation intact. Gait not checked.  PSYCHIATRIC: The patient is alert and oriented x 3.  SKIN: Bilateral heel decubiti with necrotic material on both heels.  Large right calf lesion is the biggest area with some necrotic material and there and slight surrounding erythema around the edges.  LABORATORY PANEL:   CBC Recent Labs  Lab 04/21/18 1619  WBC 13.0*  HGB 10.7*  HCT 34.8*  PLT 333   ------------------------------------------------------------------------------------------------------------------  Chemistries  Recent Labs  Lab 04/21/18 1619  NA 134*  K 4.0  CL 101  CO2 23  GLUCOSE 227*  BUN 41*  CREATININE 1.49*  CALCIUM 8.3*  AST 94*  ALT 72*  ALKPHOS 118  BILITOT 0.6   ------------------------------------------------------------------------------------------------------------------    IMPRESSION AND PLAN:   1.  Infected wounds.  Right calf lesion seems to be the worse.  But patient does have bilateral heel decubiti with some necrotic material.  ER physician consulted vascular surgery.  I will also contact podiatry.  The patient does have pulses down on his lower extremities.  Empiric antibiotics with vancomycin and cefepime.  Add on a sedimentation rate. 2.  Type 2 diabetes mellitus.  Last hemoglobin A1c 7.7.  Patient on Glucophage as outpatient.  Put on sliding scale for now. 3.  Chronic kidney disease stage III. 4.  Atrial fibrillation on metoprolol  Cardizem and digoxin for rate control.  Eliquis for anticoagulation 5.  Hyperlipidemia unspecified on Crestor 6.  History of gout on allopurinol  All the records are reviewed and case discussed with ED provider. Management plans discussed with the patient, and he is in agreement.  CODE STATUS: full code  TOTAL TIME TAKING CARE OF THIS PATIENT: 50 minutes, including acp time.    Loletha Grayer M.D on 04/21/2018 at 6:54 PM  Between 7am to 6pm - Pager - [phone]  After 6pm call admission pager [phone]  [facility]  Office  [phone]  CC: Primary care physician; [name], MD

## 2018-04-21 NOTE — ED Notes (Signed)
Report given to Vanessa RN.

## 2018-04-21 NOTE — Progress Notes (Signed)
Patient ID: Gregory Tyler, male   DOB: 09/06/1940, 77 y.o.   MRN: [id number]  ACP note  Patient present  Diagnosis infected wounds with right calf large ulceration and bilateral heel decubiti, type 2 diabetes, chronic kidney disease stage III, atrial fibrillation, hyperlipidemia, history of gout  CODE STATUS discussed and patient is a full code.  Plan.  Infected wounds with bilateral heel decubiti and large right calf lesion.  Podiatry and vascular surgery consultations.  Empiric antibiotics.  Time spent on ACP discussion 17 minutes Dr. Loletha Grayer

## 2018-04-21 NOTE — ED Notes (Signed)
Patient pulled up on stretcher. Warm blanket was given. Patient was given ED sandwich tray and diet Sprite per Dr. Kerman Passey.

## 2018-04-21 NOTE — ED Triage Notes (Signed)
Pt to the er from the wound care center for infection to ulcers on the heels of feet bilaterally and a pressure ulcer to the sacral area. Pt lives at [facility] as he is unable to walk due to the ulcers. Pt has a hx of diabetes and the wounds have worsened over a year despite tx at the wound care center.

## 2018-04-21 NOTE — ED Notes (Signed)
I-Stat Lactic ( 0.97 ), RN notified

## 2018-04-22 ENCOUNTER — Inpatient Hospital Stay: Payer: Medicare Other | Admitting: Certified Registered"

## 2018-04-22 ENCOUNTER — Encounter
Admission: EM | Disposition: A | Discharge status: Skilled Nursing Facility | Payer: Self-pay | Source: Ambulatory Visit | Attending: Specialist

## 2018-04-22 DIAGNOSIS — L97429 Non-pressure chronic ulcer of left heel and midfoot with unspecified severity: Secondary | ICD-10-CM

## 2018-04-22 DIAGNOSIS — E1151 Type 2 diabetes mellitus with diabetic peripheral angiopathy without gangrene: Secondary | ICD-10-CM

## 2018-04-22 DIAGNOSIS — I70234 Atherosclerosis of native arteries of right leg with ulceration of heel and midfoot: Secondary | ICD-10-CM

## 2018-04-22 DIAGNOSIS — L03115 Cellulitis of right lower limb: Secondary | ICD-10-CM

## 2018-04-22 DIAGNOSIS — L97218 Non-pressure chronic ulcer of right calf with other specified severity: Secondary | ICD-10-CM

## 2018-04-22 DIAGNOSIS — L97419 Non-pressure chronic ulcer of right heel and midfoot with unspecified severity: Secondary | ICD-10-CM

## 2018-04-22 DIAGNOSIS — I70232 Atherosclerosis of native arteries of right leg with ulceration of calf: Secondary | ICD-10-CM

## 2018-04-22 DIAGNOSIS — I4891 Unspecified atrial fibrillation: Secondary | ICD-10-CM

## 2018-04-22 DIAGNOSIS — I70244 Atherosclerosis of native arteries of left leg with ulceration of heel and midfoot: Secondary | ICD-10-CM

## 2018-04-22 DIAGNOSIS — E785 Hyperlipidemia, unspecified: Secondary | ICD-10-CM

## 2018-04-22 HISTORY — PX: APPLICATION OF WOUND VAC: SHX5189

## 2018-04-22 HISTORY — PX: I & D EXTREMITY: SHX5045

## 2018-04-22 LAB — GLUCOSE, CAPILLARY
GLUCOSE-CAPILLARY: 259 mg/dL — AB (ref 70–99)
Glucose-Capillary: 211 mg/dL — ABNORMAL HIGH (ref 70–99)
Glucose-Capillary: 125 mg/dL — ABNORMAL HIGH (ref 70–99)
Glucose-Capillary: 89 mg/dL (ref 70–99)

## 2018-04-22 SURGERY — IRRIGATION AND DEBRIDEMENT EXTREMITY
Anesthesia: General | Site: Leg Lower | Laterality: Right

## 2018-04-22 MED ORDER — OXYCODONE HCL 5 MG PO TABS: 5.0000 mg | ORAL_TABLET | Freq: Once | ORAL | Status: DC | PRN

## 2018-04-22 MED ORDER — MORPHINE SULFATE (PF) 2 MG/ML IV SOLN
2.0000 mg | INTRAVENOUS | Status: DC | PRN
  Administered 2018-04-22 – 2018-04-23 (×2): 2 mg via INTRAVENOUS
  Filled 2018-04-22 (×2): qty 1

## 2018-04-22 MED ORDER — HYDROCODONE-ACETAMINOPHEN 5-325 MG PO TABS
1.0000 | ORAL_TABLET | ORAL | Status: DC | PRN
  Administered 2018-04-23 – 2018-04-25 (×2): 1 via ORAL
  Filled 2018-04-22 (×2): qty 1

## 2018-04-22 MED ORDER — ONDANSETRON HCL 4 MG/2ML IJ SOLN
INTRAMUSCULAR | Status: DC | PRN
  Administered 2018-04-22: 4 mg via INTRAVENOUS

## 2018-04-22 MED ORDER — SUCCINYLCHOLINE CHLORIDE 20 MG/ML IJ SOLN
INTRAMUSCULAR | Status: DC | PRN
  Administered 2018-04-22: 100 mg via INTRAVENOUS

## 2018-04-22 MED ORDER — SODIUM CHLORIDE 0.9 % IV SOLN
INTRAVENOUS | Status: DC
  Administered 2018-04-22 (×2): via INTRAVENOUS

## 2018-04-22 MED ORDER — BUPIVACAINE HCL 0.25 % IJ SOLN
INTRAMUSCULAR | Status: DC | PRN
  Administered 2018-04-22: 30 mL

## 2018-04-22 MED ORDER — VASOPRESSIN 20 UNIT/ML IV SOLN
INTRAVENOUS | Status: DC | PRN
  Administered 2018-04-22 (×2): 1 [IU] via INTRAVENOUS
  Administered 2018-04-22: 2 [IU] via INTRAVENOUS

## 2018-04-22 MED ORDER — FENTANYL CITRATE (PF) 100 MCG/2ML IJ SOLN
INTRAMUSCULAR | Status: AC
  Filled 2018-04-22: qty 2

## 2018-04-22 MED ORDER — FENTANYL CITRATE (PF) 100 MCG/2ML IJ SOLN
INTRAMUSCULAR | Status: DC | PRN
  Administered 2018-04-22: 50 ug via INTRAVENOUS

## 2018-04-22 MED ORDER — BUPIVACAINE-EPINEPHRINE (PF) 0.25% -1:200000 IJ SOLN
INTRAMUSCULAR | Status: AC
  Filled 2018-04-22: qty 30

## 2018-04-22 MED ORDER — FUROSEMIDE 10 MG/ML IJ SOLN
10.0000 mg | Freq: Once | INTRAMUSCULAR | Status: AC
  Administered 2018-04-23: 02:00:00 10 mg via INTRAVENOUS
  Filled 2018-04-22: qty 2

## 2018-04-22 MED ORDER — FENTANYL CITRATE (PF) 100 MCG/2ML IJ SOLN: 25.0000 ug | INTRAMUSCULAR | Status: DC | PRN

## 2018-04-22 MED ORDER — PROPOFOL 10 MG/ML IV BOLUS
INTRAVENOUS | Status: DC | PRN
  Administered 2018-04-22: 120 mg via INTRAVENOUS

## 2018-04-22 MED ORDER — PROPOFOL 10 MG/ML IV BOLUS
INTRAVENOUS | Status: AC
  Filled 2018-04-22: qty 20

## 2018-04-22 MED ORDER — ROCURONIUM BROMIDE 100 MG/10ML IV SOLN
INTRAVENOUS | Status: DC | PRN
  Administered 2018-04-22 (×3): 10 mg via INTRAVENOUS

## 2018-04-22 MED ORDER — PHENYLEPHRINE HCL 10 MG/ML IJ SOLN
INTRAMUSCULAR | Status: DC | PRN
  Administered 2018-04-22: 100 ug via INTRAVENOUS
  Administered 2018-04-22: 200 ug via INTRAVENOUS
  Administered 2018-04-22 (×2): 100 ug via INTRAVENOUS

## 2018-04-22 MED ORDER — LIDOCAINE HCL (CARDIAC) PF 100 MG/5ML IV SOSY
PREFILLED_SYRINGE | INTRAVENOUS | Status: DC | PRN
  Administered 2018-04-22: 100 mg via INTRAVENOUS

## 2018-04-22 MED ORDER — SUGAMMADEX SODIUM 200 MG/2ML IV SOLN
INTRAVENOUS | Status: DC | PRN
  Administered 2018-04-22: 145 mg via INTRAVENOUS

## 2018-04-22 MED ORDER — OXYCODONE HCL 5 MG/5ML PO SOLN: 5.0000 mg | Freq: Once | ORAL | Status: DC | PRN

## 2018-04-22 SURGICAL SUPPLY — 27 items
BNDG CONFORM 2 STRL LF (GAUZE/BANDAGES/DRESSINGS) ×4 IMPLANT
BRUSH SCRUB EZ  4% CHG (MISCELLANEOUS) ×2
BRUSH SCRUB EZ 4% CHG (MISCELLANEOUS) ×2 IMPLANT
CANISTER SUCT 1200ML W/VALVE (MISCELLANEOUS) ×4 IMPLANT
CNTNR SPEC 2.5X3XGRAD LEK (MISCELLANEOUS) ×2
CONT SPEC 4OZ STER OR WHT (MISCELLANEOUS) ×2
CONTAINER SPEC 2.5X3XGRAD LEK (MISCELLANEOUS) ×2 IMPLANT
COVER WAND RF STERILE (DRAPES) ×4 IMPLANT
DRESSING SURGICEL FIBRLLR 1X2 (HEMOSTASIS) ×2 IMPLANT
DRSG SURGICEL FIBRILLAR 1X2 (HEMOSTASIS) ×4
DRSG VAC ATS MED SENSATRAC (GAUZE/BANDAGES/DRESSINGS) ×4 IMPLANT
DURAPREP 26ML APPLICATOR (WOUND CARE) ×4 IMPLANT
ELECT REM PT RETURN 9FT ADLT (ELECTROSURGICAL) ×4
ELECTRODE REM PT RTRN 9FT ADLT (ELECTROSURGICAL) ×2 IMPLANT
GAUZE SPONGE 4X4 12PLY STRL (GAUZE/BANDAGES/DRESSINGS) ×4 IMPLANT
GLOVE SURG SYN 8.0 (GLOVE) ×4 IMPLANT
GOWN STRL REUS W/ TWL LRG LVL3 (GOWN DISPOSABLE) ×4 IMPLANT
GOWN STRL REUS W/ TWL XL LVL3 (GOWN DISPOSABLE) ×2 IMPLANT
GOWN STRL REUS W/TWL LRG LVL3 (GOWN DISPOSABLE) ×4
GOWN STRL REUS W/TWL XL LVL3 (GOWN DISPOSABLE) ×2
KIT TURNOVER KIT A (KITS) ×4 IMPLANT
LABEL OR SOLS (LABEL) ×4 IMPLANT
NS IRRIG 500ML POUR BTL (IV SOLUTION) ×4 IMPLANT
PACK EXTREMITY ARMC (MISCELLANEOUS) ×4 IMPLANT
PAD NEG PRESSURE SENSATRAC (MISCELLANEOUS) ×4 IMPLANT
STOCKINETTE STRL 4IN 9604848 (GAUZE/BANDAGES/DRESSINGS) ×4 IMPLANT
WND VAC CANISTER 500ML (MISCELLANEOUS) ×4 IMPLANT

## 2018-04-22 NOTE — Progress Notes (Signed)
[place] [facility] [place] NAME: Gregory Tyler    MR#:  [id number]  DATE OF BIRTH:  11-10-1940  SUBJECTIVE:   Patient presented to the hospital due to right calf ulcer which was suspected to be infected.  Seen by podiatry and vascular surgery and planning for surgical debridement by vascular surgery later today.  Afebrile, hemodynamically stable.  No other acute events overnight  REVIEW OF SYSTEMS:    Review of Systems  Constitutional: Negative for chills and fever.  HENT: Negative for congestion and tinnitus.   Eyes: Negative for blurred vision and double vision.  Respiratory: Negative for cough, shortness of breath and wheezing.   Cardiovascular: Negative for chest pain, orthopnea and PND.  Gastrointestinal: Negative for abdominal pain, diarrhea, nausea and vomiting.  Genitourinary: Negative for dysuria and hematuria.  Neurological: Negative for dizziness, sensory change and focal weakness.  All other systems reviewed and are negative.   Nutrition: NPO Tolerating Diet: No Tolerating PT: Await Eval.   DRUG ALLERGIES:   Allergies  Allergen Reactions  . Aspirin Other (See Comments)    GI Bleeding  . Tuberculin Tests Other (See Comments)    unknown  . Codeine Other (See Comments)    BLISTERS     VITALS:  Blood pressure 113/71, pulse 88, temperature 98.1 F (36.7 C), temperature source Oral, resp. rate 18, height 5\' 8"  (1.727 m), weight 72.6 kg, SpO2 98 %.  PHYSICAL EXAMINATION:   Physical Exam  GENERAL:  77 y.o.-year-old patient lying in bed in no acute distress.  EYES: Pupils equal, round, reactive to light and accommodation. No scleral icterus. Extraocular muscles intact.  HEENT: Head atraumatic, normocephalic. Oropharynx and nasopharynx clear.  NECK:  Supple, no jugular venous distention. No thyroid enlargement, no tenderness.  LUNGS: Normal breath sounds bilaterally, no wheezing, rales, rhonchi. No use of accessory  muscles of respiration.  CARDIOVASCULAR: S1, S2 normal. No murmurs, rubs, or gallops.  ABDOMEN: Soft, nontender, nondistended. Bowel sounds present. No organomegaly or mass.  EXTREMITIES: No cyanosis, clubbing or edema b/l.    NEUROLOGIC: Cranial nerves II through XII are intact. No focal Motor or sensory deficits b/l.  Globally weak PSYCHIATRIC: The patient is alert and oriented x 3.  SKIN: No obvious rash, lesion, right calf ulcer with dressing in place, bilateral heel pressure ulcers which are stage I-II.   LABORATORY PANEL:   CBC Recent Labs  Lab 04/21/18 1619  WBC 13.0*  HGB 10.7*  HCT 34.8*  PLT 333   ------------------------------------------------------------------------------------------------------------------  Chemistries  Recent Labs  Lab 04/21/18 1619  NA 134*  K 4.0  CL 101  CO2 23  GLUCOSE 227*  BUN 41*  CREATININE 1.49*  CALCIUM 8.3*  AST 94*  ALT 72*  ALKPHOS 118  BILITOT 0.6   ------------------------------------------------------------------------------------------------------------------  Cardiac Enzymes No results for input(s): TROPONINI in the last 168 hours. ------------------------------------------------------------------------------------------------------------------  RADIOLOGY:  No results found.   ASSESSMENT AND PLAN:   76 year old male with past medical history of COPD, diabetes, hypertension, hyperlipidemia, paroxysmal atrial fibrillation, history of prostate cancer, combined diastolic systolic CHF, gout who presents to the hospital due to pain in his lower extremities and noted to have infected right calf ulcer.  1.  Infected right calf ulcer- secondary to peripheral vascular disease.  Seen by podiatry and they defer to vascular surgery regarding further management.  Continue empiric IV antibiotics with vancomycin, cefepime. - Seen by vascular surgery and plan for possible surgical debridement later today.  Follow wound  cultures. -Patient is clinically afebrile and hemodynamically stable.  2.  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  3.  History of paroxysmal atrial fibrillation- rate controlled.  Continue Cardizem, digoxin, metoprolol. -Eliquis on hold this patient to get surgical debridement today.  4.  COPD-no acute exacerbation-continue Spiriva, duo nebs as needed  5.  Diabetic neuropathy-continue gabapentin.  6.  History of gout-no acute attack.  Continue allopurinol.  All the records are reviewed and case discussed with Care Management/Social Worker. Management plans discussed with the patient, family and they are in agreement.  CODE STATUS: Full code  DVT Prophylaxis: Eliquis but on hold for surgery  TOTAL TIME TAKING CARE OF THIS PATIENT: 30 minutes.   POSSIBLE D/C IN 2-3 DAYS, DEPENDING ON CLINICAL CONDITION.   Henreitta Leber M.D on 04/22/2018 at 3:52 PM  Between 7am to 6pm - Pager - [phone]  After 6pm go to [email or web address] - Proofreader  [facility]  Office  [phone]  CC: Primary care physician; [name], MD

## 2018-04-22 NOTE — Consult Note (Signed)
[facility]                                                      Patient Demographics  Gregory Tyler, is a 77 y.o. male   MRN: [id number]   DOB - [birth year]  Admit Date - 04/21/2018    Outpatient Primary MD for the patient is Doy Hutching Leonie Douglas, MD  Consult requested in the Hospital by Henreitta Leber, MD, On 04/22/2018    Reason for consult bilateral heel ulcers and bilateral calf ulcers   With History of -  Past Medical History:  Diagnosis Date  . Anemia   . Anxiety   . Arthritis   . Cardiomyopathy ([place])    a. 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  Marland Kitchen Chronic combined systolic (congestive) and diastolic (congestive) heart failure ([place])    a. 05/2015 TEE: EF 35-40% (in setting of Afib); b. 08/2017 Echo: EF 55-60%, no rwma, Gr2DD; b. 01/2018 Echo: EF 55-60%, mild MR. Nl LA size. Nl RV fxn.  . Chronic Pelvic pain   . Coronary artery disease    a. 05/2015 CTA Chest: 3 vessel coronary Ca2+.  . Depression   . Diabetes mellitus without complication ([place])   . Emphysema lung ([place])   . Gastric ulcer   . Gout   . Hyperlipidemia   . Hypertension   . Nephrolithiasis   . PAF (paroxysmal atrial fibrillation) ([place])    a. 05/2015 s/p DCCV; b. 03/2016 s/p DCCV; c. CHA2DS2VASc = 6 (Eliquis).  . Prostate cancer [facility])    Prostate  . Stage III chronic kidney disease ([place])   . Weight loss    50 lb since Apr 24, 2015      Past Surgical History:  Procedure Laterality Date  . APPENDECTOMY    . BACK SURGERY    . CHOLECYSTECTOMY    . DIALYSIS/PERMA CATHETER INSERTION N/A 01/22/2018   Procedure: DIALYSIS/PERMA CATHETER INSERTION;  Surgeon: Katha Cabal, MD;  Location: [place] [facility];  Service: Cardiovascular;  Laterality: N/A;  . DIALYSIS/PERMA CATHETER REMOVAL N/A 04/01/2018   Procedure: DIALYSIS/PERMA CATHETER REMOVAL;   Surgeon: Katha Cabal, MD;  Location: [place] [facility];  Service: Cardiovascular;  Laterality: N/A;  . ELECTROPHYSIOLOGIC STUDY N/A 06/17/2015   Procedure: CARDIOVERSION;  Surgeon: Wellington Hampshire, MD;  Location: [facility] ORS;  Service: Cardiovascular;  Laterality: N/A;  . ELECTROPHYSIOLOGIC STUDY N/A 03/30/2016   Procedure: CARDIOVERSION;  Surgeon: Minna Merritts, MD;  Location: [facility] ORS;  Service: Cardiovascular;  Laterality: N/A;  . GASTRIC BYPASS    . HEMORRHOID SURGERY    . LOWER EXTREMITY ANGIOGRAPHY Right 01/23/2018   Procedure: LOWER EXTREMITY ANGIOGRAPHY;  Surgeon: Algernon Huxley, MD;  Location: [place] [facility];  Service: Cardiovascular;  Laterality: Right;  . LOWER EXTREMITY ANGIOGRAPHY Left 01/30/2018   Procedure: LOWER EXTREMITY ANGIOGRAPHY;  Surgeon: Algernon Huxley, MD;  Location: [place] [facility];  Service: Cardiovascular;  Laterality: Left;  . PERIPHERAL VASCULAR BALLOON ANGIOPLASTY Right 04/09/2018   Procedure: PERIPHERAL VASCULAR BALLOON ANGIOPLASTY;  Surgeon: Katha Cabal, MD;  Location: [place] [facility];  Service: Cardiovascular;  Laterality: Right;  . pilonidal cyst    . TEE WITHOUT CARDIOVERSION N/A 06/17/2015   Procedure: TRANSESOPHAGEAL ECHOCARDIOGRAM (TEE);  Surgeon: Wellington Hampshire, MD;  Location: [facility] ORS;  Service: Cardiovascular;  Laterality: N/A;    in for   Chief Complaint  Patient presents with  . Wound Infection     HPI  Gregory Tyler  is a 77 y.o. male, patient does not walk he is in bed most of the time.  Has had a chronic history of heel ulcerations which have been treated at his rehab facility.  Been try to keep pressure off of it he states the left heel is doing okay the right heel still hurts some but he states both of them have improved over the course of the last year.  Recently developed necrotic areas on the posterior calf areas with the right more extensive than the left.  Came to the ER yesterday.    Review of Systems     In addition to the HPI above,  No Fever-chills, No Headache, No changes with Vision or hearing, No problems swallowing food or Liquids, No Chest pain, Cough or Shortness of Breath, No Abdominal pain, No Nausea or Vommitting, Bowel movements are regular, No Blood in stool or Urine, No dysuria, No new skin rashes or bruises, No new joints pains-aches,  No new weakness, tingling, numbness in any extremity, No recent weight gain or loss, No polyuria, polydypsia or polyphagia, No significant Mental Stressors.  A full 10 point Review of Systems was done, except as stated above, all other Review of Systems were negative.   Social History Social History   Tobacco Use  . Smoking status: Former Smoker    Last attempt to quit: 12/19/1940    Years since quitting: 77.3  . Smokeless tobacco: Former Systems developer    Types: Chew    Quit date: 11/19/2014  Substance Use Topics  . Alcohol use: No    Family History Family History  Problem Relation Age of Onset  . CAD Mother   . Stroke Mother   . COPD Mother   . Heart failure Mother   . Diabetes Other     Prior to Admission medications   Medication Sig Start Date End Date Taking? Authorizing Provider  acetaminophen (TYLENOL) 325 MG tablet Take 2 tablets (650 mg total) by mouth every 6 (six) hours as needed for mild pain (or Fever >/= 101). 09/15/17  Yes Wieting, Richard, MD  allopurinol (ZYLOPRIM) 300 MG tablet Take 300 mg by mouth daily.    Yes [provider]  apixaban (ELIQUIS) 5 MG TABS tablet Take 1 tablet (5 mg total) by mouth 2 (two) times daily. 04/10/18  Yes Mody, Ulice Bold, MD  collagenase (SANTYL) ointment Apply 1 application topically every evening. Apply to eschar on RLE wound   Yes [provider]  digoxin 62.5 MCG TABS Take 0.0625 mg by mouth daily. 04/10/18  Yes Mody, Ulice Bold, MD  diltiazem (CARDIZEM) 30 MG tablet Take 1 tablet (30 mg total) by mouth every 8 (eight) hours. 04/10/18  Yes Mody, Ulice Bold, MD  gabapentin  (NEURONTIN) 100 MG capsule Take 1 capsule (100 mg total) by mouth at bedtime. 09/15/17  Yes Wieting, Richard, MD  HYDROcodone-acetaminophen (NORCO/VICODIN) 5-325 MG tablet Take 1 tablet by mouth every 6 (six) hours as needed for moderate pain or severe pain. 01/29/18  Yes Pyreddy, Reatha Harps, MD  Hypromellose (ARTIFICIAL TEARS) 0.4 % SOLN Place 1 drop into both eyes 2 (two) times daily.   Yes [provider]  metFORMIN (GLUCOPHAGE) 500 MG tablet Take 1 tablet (500 mg total) by mouth daily with breakfast. 04/11/18  Yes Bettey Costa, MD  metoprolol  tartrate (LOPRESSOR) 25 MG tablet Take 1 tablet (25 mg total) by mouth 2 (two) times daily. 04/10/18  Yes Mody, Ulice Bold, MD  Povidone-Iodine (BETADINE EX) Apply topically every evening. Apply to left heel and left lateral foot   Yes [provider]  rosuvastatin (CRESTOR) 20 MG tablet Take 20 mg by mouth at bedtime.    Yes [provider]  tiotropium (SPIRIVA) 18 MCG inhalation capsule Place 18 mcg into inhaler and inhale daily.    Yes [provider]  torsemide (DEMADEX) 20 MG tablet Take 20 mg by mouth daily.   Yes [provider]    Anti-infectives (From admission, onward)   Start     Dose/Rate Route Frequency Ordered Stop   04/22/18 0600  vancomycin (VANCOCIN) IVPB 1000 mg/200 mL premix     1,000 mg 200 mL/hr over 60 Minutes Intravenous Every 24 hours 04/21/18 1909     04/21/18 2200  ceFEPIme (MAXIPIME) 1 g in sodium chloride 0.9 % 100 mL IVPB     1 g 200 mL/hr over 30 Minutes Intravenous Every 12 hours 04/21/18 1905     04/21/18 1800  vancomycin (VANCOCIN) IVPB 1000 mg/200 mL premix     1,000 mg 200 mL/hr over 60 Minutes Intravenous  Once 04/21/18 1750 04/21/18 1948      Scheduled Meds: . allopurinol  300 mg Oral Daily  . apixaban  5 mg Oral BID  . collagenase  1 application Topical QPM  . digoxin  0.0625 mg Oral Daily  . diltiazem  30 mg Oral Q8H  . gabapentin  100 mg Oral QHS  . insulin aspart  0-5  Units Subcutaneous QHS  . insulin aspart  0-9 Units Subcutaneous TID WC  . metoprolol tartrate  25 mg Oral BID  . polyvinyl alcohol  1 drop Both Eyes BID  . povidone-iodine  2 application Topical QPM  . rosuvastatin  20 mg Oral QHS  . tiotropium  18 mcg Inhalation Daily   Continuous Infusions: . ceFEPime (MAXIPIME) IV 1 g (04/21/18 2237)  . vancomycin 1,000 mg (04/22/18 0552)   PRN Meds:.acetaminophen, HYDROcodone-acetaminophen  Allergies  Allergen Reactions  . Aspirin Other (See Comments)    GI Bleeding  . Tuberculin Tests Other (See Comments)    unknown  . Codeine Other (See Comments)    BLISTERS     Physical Exam  Vitals  Blood pressure 118/77, pulse 95, temperature 98 F (36.7 C), temperature source Oral, resp. rate 16, height 5\' 8"  (1.727 m), weight 72.6 kg, SpO2 96 %.  Lower Extremity exam:  Vascular: Diminished bilateral.  Patient has undergone angioplasty according to him on the right lower extremity and they want to do one on his left lower extremity.  Think this is done several weeks ago.  Dermatological: Patient has basically a healed decubitus ulcer on the left heel on the right heel there is an area approximately 2 cm diameter where there is necrotic eschar uncertain depth at this point appears to be stable with no evidence of real infection around the region at this timeframe.  On the right calf there is an extensive area of skin necrosis approximately 12 cm in length and 6 cm in width with necrotic eschar superficially and heavy drainage to the region as well as foul odor.  Left calf has an area approximately 6 cm in length and 1 cm in width with no significant drainage or evidence of infection.  Neurological: Patient does have pain with these areas and is normally  sensate and.   Data Review  CBC Recent Labs  Lab 04/21/18 1619  WBC 13.0*  HGB 10.7*  HCT 34.8*  PLT 333  MCV 93.5  MCH 28.8  MCHC 30.7  RDW 15.1  LYMPHSABS 1.3  MONOABS 0.8  EOSABS  0.0  BASOSABS 0.0   ------------------------------------------------------------------------------------------------------------------  Chemistries  Recent Labs  Lab 04/21/18 1619  NA 134*  K 4.0  CL 101  CO2 23  GLUCOSE 227*  BUN 41*  CREATININE 1.49*  CALCIUM 8.3*  AST 94*  ALT 72*  ALKPHOS 118  BILITOT 0.6   ------------------------------------------------------------------------------------------------------------------ estimated creatinine clearance is 39.5 mL/min (A) (by C-G formula based on SCr of 1.49 mg/dL (H)). ------------------------------------------------------------------------------------------------------------------ No results for input(s): TSH, T4TOTAL, T3FREE, THYROIDAB in the last 72 hours.  Invalid input(s): FREET3 Urinalysis    Component Value Date/Time   COLORURINE YELLOW (A) 04/07/2018 1539   APPEARANCEUR HAZY (A) 04/07/2018 1539   LABSPEC 1.008 04/07/2018 1539   PHURINE 5.0 04/07/2018 1539   GLUCOSEU NEGATIVE 04/07/2018 1539   HGBUR SMALL (A) 04/07/2018 1539   BILIRUBINUR NEGATIVE 04/07/2018 1539   KETONESUR NEGATIVE 04/07/2018 1539   PROTEINUR NEGATIVE 04/07/2018 1539   NITRITE NEGATIVE 04/07/2018 1539   LEUKOCYTESUR MODERATE (A) 04/07/2018 1539     Imaging results:   No results found.  Assessment & Plan: I think a continuing the dressings on the heels for protection is still an adequate treatment for these areas at this point.  He may ultimately need some type of debridement on the right heel but possibly issues some Santyl and wet-to-dry saline on the right heel to try and debride that necrotic area.  I would refer him back to the wound care nurse for that.  The calf ulcerations are too proximal for my scope of practice I will refer him to Dr. Delana Meyer for decision on whether or not he needs to do the debridement or if he wants to refer him to general surgery.  Active Problems:   Wound infection   Family Communication: Plan discussed  with patient   Albertine Patricia M.D on 04/22/2018 at 7:52 AM  Thank you for the consult, we will follow the patient with you in the Hospital.

## 2018-04-22 NOTE — Transfer of Care (Signed)
Immediate Anesthesia Transfer of Care Note  Patient: Gregory Tyler  Procedure(s) Performed: DEBRIDEMENT CALF (Right Leg Lower) APPLICATION OF WOUND VAC (Right )  Patient Location: PACU  Anesthesia Type:General  Level of Consciousness: awake and alert   Airway & Oxygen Therapy: Patient connected to face mask oxygen  Post-op Assessment: Post -op Vital signs reviewed and stable  Post vital signs: stable  Last Vitals:  Vitals Value Taken Time  BP 113/55 04/22/2018  6:11 PM  Temp 37.2 C 04/22/2018  6:11 PM  Pulse 84 04/22/2018  6:13 PM  Resp 16 04/22/2018  6:13 PM  SpO2 100 % 04/22/2018  6:13 PM  Vitals shown include unvalidated device data.  Last Pain:  Vitals:   04/22/18 1811  TempSrc: Temporal  PainSc:       Patients Stated Pain Goal: 5 (84/72/07 2182)  Complications: No apparent anesthesia complications

## 2018-04-22 NOTE — Anesthesia Procedure Notes (Signed)
Procedure Name: Intubation Date/Time: 04/22/2018 4:51 PM Performed by: Aline Brochure, CRNA Pre-anesthesia Checklist: Patient identified, Emergency Drugs available, Suction available and Patient being monitored Patient Re-evaluated:Patient Re-evaluated prior to induction Oxygen Delivery Method: Circle system utilized Preoxygenation: Pre-oxygenation with 100% oxygen Induction Type: IV induction Ventilation: Mask ventilation without difficulty Laryngoscope Size: McGraph and 4 Grade View: Grade I Tube type: Oral Tube size: 7.5 mm Number of attempts: 1 Airway Equipment and Method: Stylet and Video-laryngoscopy Placement Confirmation: ETT inserted through vocal cords under direct vision,  positive ETCO2 and breath sounds checked- equal and bilateral Secured at: 21 cm Tube secured with: Tape Dental Injury: Teeth and Oropharynx as per pre-operative assessment  Difficulty Due To: Difficult Airway- due to anterior larynx

## 2018-04-22 NOTE — Anesthesia Preprocedure Evaluation (Addendum)
Anesthesia Evaluation  Patient identified by MRN, date of birth, ID band Patient awake    Reviewed: Allergy & Precautions, H&P , NPO status , Patient's Chart, lab work & pertinent test results  History of Anesthesia Complications Negative for: history of anesthetic complications  Airway Mallampati: III  TM Distance: <3 FB Neck ROM: limited    Dental  (+) Chipped, Poor Dentition, Missing   Pulmonary COPD, former smoker,           Cardiovascular Exercise Tolerance: Good hypertension, (-) angina+ CAD, + Peripheral Vascular Disease and +CHF  + dysrhythmias Atrial Fibrillation      Neuro/Psych negative neurological ROS  negative psych ROS   GI/Hepatic Neg liver ROS, PUD, GERD  Medicated and Controlled,  Endo/Other  diabetes, Type 2  Renal/GU Renal disease  negative genitourinary   Musculoskeletal  (+) Arthritis ,   Abdominal   Peds  Hematology negative hematology ROS (+)   Anesthesia Other Findings Past Medical History: No date: Anemia No date: Anxiety No date: Arthritis No date: Cardiomyopathy [facility])     Comment:  a. Presumed to be NICM in setting of AFib - EF 35-40%               05/2015. No date: Chronic combined systolic (congestive) and diastolic  (congestive) heart failure ([place])     Comment:  a. 05/2015 TEE: EF 35-40% (in setting of Afib); b. 08/2017              Echo: EF 55-60%, no rwma, Gr2DD; b. 01/2018 Echo: EF               55-60%, mild MR. Nl LA size. Nl RV fxn. No date: Chronic Pelvic pain No date: Coronary artery disease     Comment:  a. 05/2015 CTA Chest: 3 vessel coronary Ca2+. No date: Depression No date: Diabetes mellitus without complication ([facility]) No date: Emphysema lung ([facility]) No date: Gastric ulcer No date: Gout No date: Hyperlipidemia No date: Hypertension No date: Nephrolithiasis No date: PAF (paroxysmal atrial fibrillation) ([place])     Comment:  a. 05/2015 s/p DCCV; b. 03/2016 s/p DCCV; c.  CHA2DS2VASc               = 6 (Eliquis). No date: Prostate cancer [facility])     Comment:  Prostate No date: Stage III chronic kidney disease ([place]) No date: Weight loss     Comment:  50 lb since Apr 24, 2015  Past Surgical History: No date: APPENDECTOMY No date: BACK SURGERY No date: CHOLECYSTECTOMY 01/22/2018: DIALYSIS/PERMA CATHETER INSERTION; N/A     Comment:  Procedure: DIALYSIS/PERMA CATHETER INSERTION;  Surgeon:               Katha Cabal, MD;  Location: [place] [facility];               Service: Cardiovascular;  Laterality: N/A; 04/01/2018: DIALYSIS/PERMA CATHETER REMOVAL; N/A     Comment:  Procedure: DIALYSIS/PERMA CATHETER REMOVAL;  Surgeon:               Katha Cabal, MD;  Location: [place] [facility];               Service: Cardiovascular;  Laterality: N/A; 06/17/2015: ELECTROPHYSIOLOGIC STUDY; N/A     Comment:  Procedure: CARDIOVERSION;  Surgeon: Wellington Hampshire,               MD;  Location: [facility] ORS;  Service: Cardiovascular;  Laterality: N/A; 03/30/2016: ELECTROPHYSIOLOGIC STUDY; N/A     Comment:  Procedure: CARDIOVERSION;  Surgeon: Minna Merritts,               MD;  Location: [facility] OR[facility];  Service: Cardiovascular;                Laterality: N/A; No date: GASTRIC BYPASS No date: HEMORRHOID SURGERY 01/23/2018: LOWER EXTREMITY ANGIOGRAPHY; Right     Comment:  Procedure: LOWER EXTREMITY ANGIOGRAPHY;  Surgeon: Algernon Huxley, MD;  Location: [place] [facility];  Service:               Cardiovascular;  Laterality: Right; 01/30/2018: LOWER EXTREMITY ANGIOGRAPHY; Left     Comment:  Procedure: LOWER EXTREMITY ANGIOGRAPHY;  Surgeon: Algernon Huxley, MD;  Location: [place] [facility];  Service:               Cardiovascular;  Laterality: Left; 04/09/2018: PERIPHERAL VASCULAR BALLOON ANGIOPLASTY; Right     Comment:  Procedure: PERIPHERAL VASCULAR BALLOON ANGIOPLASTY;                Surgeon: Katha Cabal, MD;  Location: [place]              CV LAB;  Service: Cardiovascular;  Laterality: Right; No date: pilonidal cyst 06/17/2015: TEE WITHOUT CARDIOVERSION; N/A     Comment:  Procedure: TRANSESOPHAGEAL ECHOCARDIOGRAM (TEE);                Surgeon: Wellington Hampshire, MD;  Location: [facility] ORS;                Service: Cardiovascular;  Laterality: N/A;  BMI    Body Mass Index:  24.33 kg/m      Reproductive/Obstetrics negative OB ROS                            Anesthesia Physical Anesthesia Plan  ASA: IV  Anesthesia Plan: General ETT   Post-op Pain Management:    Induction: Intravenous  PONV Risk Score and Plan: Ondansetron, Dexamethasone and Treatment may vary due to age or medical condition  Airway Management Planned: Oral ETT  Additional Equipment:   Intra-op Plan:   Post-operative Plan: Extubation in OR  Informed Consent: I have reviewed the patients History and Physical, chart, labs and discussed the procedure including the risks, benefits and alternatives for the proposed anesthesia with the patient or authorized representative who has indicated his/her understanding and acceptance.   Dental Advisory Given  Plan Discussed with: Anesthesiologist, CRNA and Surgeon  Anesthesia Plan Comments: (Patient consented for risks of anesthesia including but not limited to:  - adverse reactions to medications - risk of intubation if required - damage to teeth, lips or other oral mucosa - sore throat or hoarseness - Damage to heart, brain, lungs or loss of life  Patient voiced understanding.)      Anesthesia Quick Evaluation

## 2018-04-22 NOTE — NC FL2 (Signed)
[place] LEVEL OF CARE SCREENING TOOL     IDENTIFICATION  Patient Name: Gregory Tyler Birthdate: 02-07-1941 Sex: male Admission Date (Current Location): 04/21/2018  [place] and [place] Number:  Engineering geologist and Address:  [facility], [address] [place],  [address]      Provider Number: [id number]  Attending Physician Name and Address:  Henreitta Leber, MD  Relative Name and Phone Number:       Current Level of Care: Hospital Recommended Level of Care: [place] Prior Approval Number:    Date Approved/Denied:   PASRR Number: [id number]  Discharge Plan: SNF    Current Diagnoses: Patient Active Problem List   Diagnosis Date Noted  . Wound infection 04/21/2018  . Atrial fibrillation with RVR ([place]) 04/07/2018  . Protein-calorie malnutrition, severe 01/21/2018  . Acute renal failure superimposed on chronic kidney disease ([place])   . Palliative care by specialist   . DNR (do not resuscitate)   . Sepsis ([place]) 01/13/2018  . Atherosclerotic PVD with ulceration ([place]) 01/09/2018  . Diarrhea 12/19/2017  . Pressure injury of skin 11/16/2017  . Enteritis 11/15/2017  . Malnutrition of moderate degree 09/13/2017  . Elevated INR 09/02/2017  . Paroxysmal atrial fibrillation ([place]) 10/25/2016  . Weakness generalized   . Hyperlipidemia 08/08/2015  . Diabetes ([place]) 05/19/2015  . Hyperkalemia   . Dehydration   . Orthostatic hypotension 05/09/2015  . Congestive dilated cardiomyopathy ([place]) 05/09/2015  . Polypharmacy 04/27/2015  . Chronic diastolic CHF (congestive heart failure) ([place]) 04/25/2015  . SOB (shortness of breath)   . Coronary artery disease involving native coronary artery of native heart without angina pectoris   . Essential hypertension     Orientation RESPIRATION BLADDER Height & Weight     Self, Place  Normal Incontinent Weight: 160 lb (72.6 kg) Height:  5\' 8"  (172.7 cm)  BEHAVIORAL  SYMPTOMS/MOOD NEUROLOGICAL BOWEL NUTRITION STATUS  (none) (none) Continent Diet(NPO to be advanced )  AMBULATORY STATUS COMMUNICATION OF NEEDS Skin   Extensive Assist Verbally Other (Comment)(Unstageable pressure wound on Left heel )                       Personal Care Assistance Level of Assistance  Bathing, Feeding, Dressing Bathing Assistance: Limited assistance Feeding assistance: Independent Dressing Assistance: Limited assistance Total Care Assistance: Limited assistance   Functional Limitations Info  Sight, Hearing, Speech Sight Info: Adequate Hearing Info: Adequate Speech Info: Adequate    SPECIAL CARE FACTORS FREQUENCY                       Contractures Contractures Info: Not present    Additional Factors Info  Code Status, Allergies Code Status Info: Full Code  Allergies Info: Aspirin, Tuberculin test, Codiene            Current Medications (04/22/2018):  This is the current hospital active medication list Current Facility-Administered Medications  Medication Dose Route Frequency Provider Last Rate Last Dose  . 0.9 %  sodium chloride infusion   Intravenous Continuous Piscitello, Precious Haws, MD 50 mL/hr at 04/22/18 1531    . acetaminophen (TYLENOL) tablet 650 mg  650 mg Oral Q6H PRN Loletha Grayer, MD      . allopurinol (ZYLOPRIM) tablet 300 mg  300 mg Oral Daily Loletha Grayer, MD   300 mg at 04/22/18 0925  . ceFEPIme (MAXIPIME) 1 g in sodium chloride 0.9 % 100 mL IVPB  1  g Intravenous Q12H Lu Duffel, [place] 200 mL/hr at 04/22/18 0109 1 g at 04/22/18 3235  . collagenase (SANTYL) ointment 1 application  1 application Topical QPM Loletha Grayer, MD   1 application at 57/32/20 2224  . digoxin (LANOXIN) tablet 0.0625 mg  0.0625 mg Oral Daily Loletha Grayer, MD   0.0625 mg at 04/22/18 0925  . diltiazem (CARDIZEM) tablet 30 mg  30 mg Oral Q8H Loletha Grayer, MD   30 mg at 04/22/18 0550  . gabapentin (NEURONTIN) capsule 100 mg  100 mg Oral  QHS Loletha Grayer, MD   100 mg at 04/21/18 2220  . HYDROcodone-acetaminophen (NORCO/VICODIN) 5-325 MG per tablet 1 tablet  1 tablet Oral Q4H PRN Henreitta Leber, MD      . insulin aspart (novoLOG) injection 0-5 Units  0-5 Units Subcutaneous QHS Loletha Grayer, MD   3 Units at 04/21/18 2232  . insulin aspart (novoLOG) injection 0-9 Units  0-9 Units Subcutaneous TID WC Loletha Grayer, MD   3 Units at 04/22/18 [phone]  . metoprolol tartrate (LOPRESSOR) tablet 25 mg  25 mg Oral BID Loletha Grayer, MD   25 mg at 04/22/18 7062  . polyvinyl alcohol (LIQUIFILM TEARS) 1.4 % ophthalmic solution 1 drop  1 drop Both Eyes BID Loletha Grayer, MD   1 drop at 04/22/18 0925  . povidone-iodine 10 % swab 2 application  2 application Topical QPM Wieting, Richard, MD      . rosuvastatin (CRESTOR) tablet 20 mg  20 mg Oral QHS Loletha Grayer, MD   20 mg at 04/21/18 2219  . tiotropium (SPIRIVA) inhalation capsule ([facility] use ONLY) 18 mcg  18 mcg Inhalation Daily Loletha Grayer, MD   18 mcg at 04/22/18 0925  . vancomycin (VANCOCIN) IVPB 1000 mg/200 mL premix  1,000 mg Intravenous Q24H Lu Duffel, RPH 200 mL/hr at 04/22/18 0552 1,000 mg at 04/22/18 3762   Facility-Administered Medications Ordered in Other Encounters  Medication Dose Route Frequency Provider Last Rate Last Dose  . ceFAZolin (ANCEF) IVPB 1 g/50 mL premix  1 g Intravenous Once Kris Hartmann, NP         Discharge Medications: Please see discharge summary for a list of discharge medications.  Relevant Imaging Results:  Relevant Lab Results:   Additional Information    Tryphena Perkovich  Louretta Shorten, LCSWA

## 2018-04-22 NOTE — Op Note (Signed)
    OPERATIVE NOTE   PROCEDURE: Excisional debridement right calf  PRE-OPERATIVE DIAGNOSIS: Necrotic ulceration right calf  POST-OPERATIVE DIAGNOSIS: Same  SURGEON: Hortencia Pilar  ASSISTANT(S): None  ANESTHESIA: general  ESTIMATED BLOOD LOSS: 25 cc  FINDING(S): Necrotic muscle bellies and tendon associated with skin and soft tissues  SPECIMEN(S): Deep wound culture sent to microbiology necrotic debrided tissue sent to pathology  INDICATIONS:   Gregory Tyler is a 77 y.o. male who presents with cellulitis and worsening infection of his right leg.  He has a necrotic ulceration of the posterior calf and is undergoing debridement.  Risks and benefits of been reviewed all questions answered patient agrees to proceed..  DESCRIPTION: After full informed written consent was obtained from the patient, the patient was brought back to the operating room and placed supine upon the operating table.  Prior to induction, the patient received IV antibiotics.   After obtaining adequate anesthesia, the patient was then prepped and draped in the standard fashion for a debridement right calf.  25 cc of quarter percent Marcaine with epinephrine is then infiltrated into the soft tissue surrounding the ulcer and using a 10 blade scalpel the margin of the ulcer is incised.  After removing the surface necrotic eschar a small amounts of liquid fied tissue and pus was encountered.  Tissue was debrided and placed in a sterile specimen cup for specimen for microbiology.  Using a combination of a 15 blade scalpel and curved Mayo's necrotic tendon and muscle bellies were debrided all the way down to the surface of the fibula.  Once viable tissue had been encountered throughout the base of the wound it was washed with sterile saline and then hemostasis obtained with Bovie cautery.  Several areas of the wound were packed with fibrillar.  The wound was then measured and the measurements are 13 cm in length 6 cm  in width and 3.5 cm in depth.  VAC wound was then placed.   The patient tolerated this procedure well.   COMPLICATIONS: None  CONDITION: [facility]  Office: [phone]   04/22/2018, 6:15 PM

## 2018-04-22 NOTE — Anesthesia Postprocedure Evaluation (Signed)
Anesthesia Post Note  Patient: BHARGAV BARBARO  Procedure(s) Performed: DEBRIDEMENT CALF (Right Leg Lower) APPLICATION OF WOUND VAC (Right )  Patient location during evaluation: PACU Anesthesia Type: General Level of consciousness: awake and alert Pain management: pain level controlled Vital Signs Assessment: post-procedure vital signs reviewed and stable Respiratory status: spontaneous breathing, nonlabored ventilation, respiratory function stable and patient connected to nasal cannula oxygen Cardiovascular status: blood pressure returned to baseline and stable Postop Assessment: no apparent nausea or vomiting Anesthetic complications: no     Last Vitals:  Vitals:   04/22/18 1906 04/22/18 1927  BP: 107/66 (!) 120/58  Pulse: 88 (!) 102  Resp: 13 17  Temp: 36.8 C 36.5 C  SpO2: 96% 94%    Last Pain:  Vitals:   04/22/18 1927  TempSrc: Oral  PainSc:                  Precious Haws Piscitello

## 2018-04-22 NOTE — Consult Note (Signed)
[facility] Consult Note  MRN : [id number]  Gregory Tyler is a 77 y.o. (03/26/1941) male who presents with chief complaint of  Chief Complaint  Patient presents with  . Wound Infection  .  History of Present Illness:   I am asked to see the patient by Henreitta Leber, MD, On 04/22/2018.  The patient is a 77 year old gentleman known to our service who is status post arterial intervention earlier this month.  He presented to the emergency room yesterday with worsening wounds of both heels as well as the right posterior calf.  They have also developed an odor.  He was evaluated at the wound center and was referred to the emergency room.  Patient denies fever chills.  He does have severe pain.  Pain is localized primarily to his right calf and heels.  The pain is continuous with exacerbation by movement or touch.    Current Facility-Administered Medications  Medication Dose Route Frequency Provider Last Rate Last Dose  . acetaminophen (TYLENOL) tablet 650 mg  650 mg Oral Q6H PRN Wieting, Richard, MD      . allopurinol (ZYLOPRIM) tablet 300 mg  300 mg Oral Daily Wieting, Richard, MD      . apixaban Arne [place]) tablet 5 mg  5 mg Oral BID Loletha Grayer, MD   5 mg at 04/21/18 2220  . ceFEPIme (MAXIPIME) 1 g in sodium chloride 0.9 % 100 mL IVPB  1 g Intravenous Q12H Lu Duffel, RPH 200 mL/hr at 04/21/18 2237 1 g at 04/21/18 2237  . collagenase (SANTYL) ointment 1 application  1 application Topical QPM Loletha Grayer, MD   1 application at 50/53/97 2224  . digoxin (LANOXIN) tablet 0.0625 mg  0.0625 mg Oral Daily Wieting, Richard, MD      . diltiazem (CARDIZEM) tablet 30 mg  30 mg Oral Q8H Loletha Grayer, MD   30 mg at 04/22/18 0550  . gabapentin (NEURONTIN) capsule 100 mg  100 mg Oral QHS Loletha Grayer, MD   100 mg at 04/21/18 2220  . HYDROcodone-acetaminophen (NORCO/VICODIN) 5-325 MG per tablet 1 tablet  1 tablet Oral Q6H PRN Loletha Grayer, MD   1 tablet at 04/22/18 0604  . insulin aspart (novoLOG) injection 0-5 Units  0-5 Units Subcutaneous QHS Loletha Grayer, MD   3 Units at 04/21/18 2232  . insulin aspart (novoLOG) injection 0-9 Units  0-9 Units Subcutaneous TID WC Wieting, Richard, MD      . metoprolol tartrate (LOPRESSOR) tablet 25 mg  25 mg Oral BID Loletha Grayer, MD   25 mg at 04/21/18 2219  . polyvinyl alcohol (LIQUIFILM TEARS) 1.4 % ophthalmic solution 1 drop  1 drop Both Eyes BID Loletha Grayer, MD   1 drop at 04/21/18 2220  . povidone-iodine 10 % swab 2 application  2 application Topical QPM Wieting, Richard, MD      . rosuvastatin (CRESTOR) tablet 20 mg  20 mg Oral QHS Loletha Grayer, MD   20 mg at 04/21/18 2219  . tiotropium (SPIRIVA) inhalation capsule ([facility] use ONLY) 18 mcg  18 mcg Inhalation Daily Loletha Grayer, MD   18 mcg at 04/21/18 2224  . vancomycin (VANCOCIN) IVPB 1000 mg/200 mL premix  1,000 mg Intravenous Q24H Lu Duffel, RPH 200 mL/hr at 04/22/18 0552 1,000 mg at 04/22/18 6734   Facility-Administered Medications Ordered in Other Encounters  Medication Dose Route Frequency Provider Last Rate Last Dose  . ceFAZolin (ANCEF) IVPB 1 g/50 mL premix  1  g Intravenous Once Kris Hartmann, NP        Past Medical History:  Diagnosis Date  . Anemia   . Anxiety   . Arthritis   . Cardiomyopathy ([place])    a. 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  Marland Kitchen Chronic combined systolic (congestive) and diastolic (congestive) heart failure ([place])    a. 05/2015 TEE: EF 35-40% (in setting of Afib); b. 08/2017 Echo: EF 55-60%, no rwma, Gr2DD; b. 01/2018 Echo: EF 55-60%, mild MR. Nl LA size. Nl RV fxn.  . Chronic Pelvic pain   . Coronary artery disease    a. 05/2015 CTA Chest: 3 vessel coronary Ca2+.  . Depression   . Diabetes mellitus without complication ([place])   . Emphysema lung ([place])   . Gastric ulcer   . Gout   . Hyperlipidemia   . Hypertension   . Nephrolithiasis   . PAF  (paroxysmal atrial fibrillation) ([place])    a. 05/2015 s/p DCCV; b. 03/2016 s/p DCCV; c. CHA2DS2VASc = 6 ([place]).  . Prostate cancer [facility])    Prostate  . Stage III chronic kidney disease ([place])   . Weight loss    50 lb since Apr 24, 2015    Past Surgical History:  Procedure Laterality Date  . APPENDECTOMY    . BACK SURGERY    . CHOLECYSTECTOMY    . DIALYSIS/PERMA CATHETER INSERTION N/A 01/22/2018   Procedure: DIALYSIS/PERMA CATHETER INSERTION;  Surgeon: Katha Cabal, MD;  Location: [place] [facility];  Service: Cardiovascular;  Laterality: N/A;  . DIALYSIS/PERMA CATHETER REMOVAL N/A 04/01/2018   Procedure: DIALYSIS/PERMA CATHETER REMOVAL;  Surgeon: Katha Cabal, MD;  Location: [place] [facility];  Service: Cardiovascular;  Laterality: N/A;  . ELECTROPHYSIOLOGIC STUDY N/A 06/17/2015   Procedure: CARDIOVERSION;  Surgeon: Wellington Hampshire, MD;  Location: [facility];  Service: Cardiovascular;  Laterality: N/A;  . ELECTROPHYSIOLOGIC STUDY N/A 03/30/2016   Procedure: CARDIOVERSION;  Surgeon: Minna Merritts, MD;  Location: [facility];  Service: Cardiovascular;  Laterality: N/A;  . GASTRIC BYPASS    . HEMORRHOID SURGERY    . LOWER EXTREMITY ANGIOGRAPHY Right 01/23/2018   Procedure: LOWER EXTREMITY ANGIOGRAPHY;  Surgeon: Algernon Huxley, MD;  Location: [place] [facility];  Service: Cardiovascular;  Laterality: Right;  . LOWER EXTREMITY ANGIOGRAPHY Left 01/30/2018   Procedure: LOWER EXTREMITY ANGIOGRAPHY;  Surgeon: Algernon Huxley, MD;  Location: [place] [facility];  Service: Cardiovascular;  Laterality: Left;  . PERIPHERAL VASCULAR BALLOON ANGIOPLASTY Right 04/09/2018   Procedure: PERIPHERAL VASCULAR BALLOON ANGIOPLASTY;  Surgeon: Katha Cabal, MD;  Location: [place] [facility];  Service: Cardiovascular;  Laterality: Right;  . pilonidal cyst    . TEE WITHOUT CARDIOVERSION N/A 06/17/2015   Procedure: TRANSESOPHAGEAL ECHOCARDIOGRAM (TEE);  Surgeon: Wellington Hampshire, MD;  Location:  [facility] ORS;  Service: Cardiovascular;  Laterality: N/A;    Social History Social History   Tobacco Use  . Smoking status: Former Smoker    Last attempt to quit: 12/19/1940    Years since quitting: 77.3  . Smokeless tobacco: Former Systems developer    Types: Chew    Quit date: 11/19/2014  Substance Use Topics  . Alcohol use: No  . Drug use: No    Family History Family History  Problem Relation Age of Onset  . CAD Mother   . Stroke Mother   . COPD Mother   . Heart failure Mother   . Diabetes Other   No  family history of bleeding/clotting disorders, porphyria or autoimmune disease   Allergies  Allergen Reactions  . Aspirin Other (See Comments)    GI Bleeding  . Tuberculin Tests Other (See Comments)    unknown  . Codeine Other (See Comments)    BLISTERS      REVIEW OF SYSTEMS (Negative unless checked)  Constitutional: [] Weight loss  [] Fever  [] Chills Cardiac: [] Chest pain   [] Chest pressure   [] Palpitations   [] Shortness of breath when laying flat   [] Shortness of breath at rest   [] Shortness of breath with exertion. Vascular:  [x] Pain in legs with walking   [x] Pain in legs at rest   [] Pain in legs when laying flat   [] Claudication   [] Pain in feet when walking  [] Pain in feet at rest  [] Pain in feet when laying flat   [] History of DVT   [] Phlebitis   [] Swelling in legs   [] Varicose veins   [x] Non-healing ulcers Pulmonary:   [] Uses home oxygen   [] Productive cough   [] Hemoptysis   [] Wheeze  [x] COPD   [] Asthma Neurologic:  [] Dizziness  [] Blackouts   [] Seizures   [] History of stroke   [] History of TIA  [] Aphasia   [] Temporary blindness   [] Dysphagia   [] Weakness or numbness in arms   [x] Weakness or numbness in legs Musculoskeletal:  [] Arthritis   [] Joint swelling   [] Joint pain   [] Low back pain Hematologic:  [] Easy bruising  [] Easy bleeding   [] Hypercoagulable state   [] Anemic  [] Hepatitis Gastrointestinal:  [] Blood in stool   [] Vomiting blood  [] Gastroesophageal reflux/heartburn    [] Difficulty swallowing. Genitourinary:  [] Chronic kidney disease   [] Difficult urination  [] Frequent urination  [] Burning with urination   [] Blood in urine Skin:  [] Rashes   [] Ulcers   [] Wounds Psychological:  [] History of anxiety   []  History of major depression.   Physical Examination  Vitals:   04/21/18 1700 04/21/18 1930 04/21/18 2028 04/22/18 0551  BP: 116/71 99/65 115/65 118/77  Pulse: 91 93 (!) 104 95  Resp: (!) 22 16 16    Temp:   98.3 F (36.8 C) 98 F (36.7 C)  TempSrc:   Oral Oral  SpO2: 100% 100% 97% 96%  Weight:      Height:       Body mass index is 24.33 kg/m.  Head: Halfway/AT, No temporalis wasting. Prominent temp pulse not noted. Ear/Nose/Throat: Nares w/o erythema or drainage, oropharynx w/o obsrtuction, Mallampati score: Class III.  Dentition poor.  Eyes: PERRLA, Sclera nonicteric.  Neck: Supple, no nuchal rigidity.  No bruit or JVD.  Pulmonary:  Breath sounds equal bilaterally, no use of accessory muscles.  Cardiac: RRR, normal S1, S2, no Murmurs, rubs or gallops. Vascular: Pedal pulses are nonpalpable but Doppler signals are strongly biphasic to triphasic Gastrointestinal: soft, non-tender, non-distended.  Musculoskeletal: Moves all extremities.  Bilateral heel ulcers with dry eschar and some odor the right posterior calf has an elliptical area of eschar which is soft and malodorous left posterior calf has a smaller more superficial ulcer which does not have the odor, no edema. Neurologic: CN 2-12 intact. Symmetrical.  Speech is fluent.  Psychiatric: Judgment intact, Mood & affect appropriate for pt's clinical situation. Dermatologic: No rashes positive ulcers noted.  + cellulitis + open wounds. Lymph : No Cervical,  or Inguinal lymphadenopathy.      CBC Lab Results  Component Value Date   WBC 13.0 (H) 04/21/2018   HGB 10.7 (L) 04/21/2018   HCT 34.8 (L) 04/21/2018  MCV 93.5 04/21/2018   PLT 333 04/21/2018    BMET    Component Value Date/Time   NA  134 (L) 04/21/2018 1619   NA 139 03/27/2016 1634   K 4.0 04/21/2018 1619   CL 101 04/21/2018 1619   CO2 23 04/21/2018 1619   GLUCOSE 227 (H) 04/21/2018 1619   BUN 41 (H) 04/21/2018 1619   BUN 29 (H) 03/27/2016 1634   CREATININE 1.49 (H) 04/21/2018 1619   CALCIUM 8.3 (L) 04/21/2018 1619   GFRNONAA 44 (L) 04/21/2018 1619   GFRAA 51 (L) 04/21/2018 1619   Estimated Creatinine Clearance: 39.5 mL/min (A) (by C-G formula based on SCr of 1.49 mg/dL (H)).  COAG Lab Results  Component Value Date   INR 1.30 04/07/2018   INR 1.46 01/18/2018   INR 1.75 11/19/2017     Assessment/Plan 1.  Right posterior calf ulcer which appears necrotic and infected: Patient is on appropriate antibiotics.  I will plan for debridement later on this afternoon.  The risks and benefits have been reviewed with the patient and his son.  I have also discussed VAC wound care.  All questions were answered and patient agrees to proceed.  2.  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  He appears to be patent with good perfusion.  There are plans already for angiography of the left and we will continue with this.  Continue aspirin with Eliquis for now  3.  Type 2 diabetes mellitus.   Last hemoglobin A1c 7.7.  Patient on Glucophage as outpatient.  Put on sliding scale for now.  4.  Atrial fibrillation:  on metoprolol Cardizem and digoxin for rate control.  Eliquis for anticoagulation  5.  Hyperlipidemia unspecified:  on Crestor    Hortencia Pilar, MD  04/22/2018 8:27 AM

## 2018-04-22 NOTE — Anesthesia Post-op Follow-up Note (Signed)
Anesthesia QCDR form completed.        

## 2018-04-22 NOTE — Care Management (Signed)
Long term care resident of [facility].  Had I/D of right necrotic calf wound today by vascular

## 2018-04-22 NOTE — Progress Notes (Deleted)
Cardiology Office Note Date:  04/22/2018  Patient ID:  Gregory Tyler, Gregory Tyler Mar 22, 1941, MRN [id number] PCP:  [name], MD  Cardiologist:  Dr. Rockey Situ, MD  ***refresh   Chief Complaint: Hospital follow up  History of Present Illness: Gregory Tyler is a 76 y.o. male with history of PAF, chronic combined CHF, ESRD on HD, DM2, prostate cancer, PAD with chronic foot ulcerations followed by wound care and vascular surgery, anemia, HTN, HLD, and chronic pelvic pain who presents for hospital follow up after recent admission to [facility] from *** to *** for ***.   He was previously noted to have LV dysfunction in the setting of Afib in 05/2015, however subsequent echoes have shown improvement in LV systolic function. He was admitted in 12/2017 for AMS resulting from Pseudomonas urosepsis that resulted in ARF requiring inpatient HD. In that setting, he was noted to have transient elevation in LFTs that was felt to be secondary to shock liver. Echo during his 12/2017 admission showed an EF of 55-60% with septal WMA secondary to LBBB.    Past Medical History:  Diagnosis Date  . Anemia   . Anxiety   . Arthritis   . Cardiomyopathy ([place])    a. 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  Marland Kitchen Chronic combined systolic (congestive) and diastolic (congestive) heart failure ([place])    a. 05/2015 TEE: EF 35-40% (in setting of Afib); b. 08/2017 Echo: EF 55-60%, no rwma, Gr2DD; b. 01/2018 Echo: EF 55-60%, mild MR. Nl LA size. Nl RV fxn.  . Chronic Pelvic pain   . Coronary artery disease    a. 05/2015 CTA Chest: 3 vessel coronary Ca2+.  . Depression   . Diabetes mellitus without complication ([place])   . Emphysema lung ([place])   . Gastric ulcer   . Gout   . Hyperlipidemia   . Hypertension   . Nephrolithiasis   . PAF (paroxysmal atrial fibrillation) ([place])    a. 05/2015 s/p DCCV; b. 03/2016 s/p DCCV; c. CHA2DS2VASc = 6 ([facility]).  . Prostate cancer [facility])    Prostate  . Stage III chronic  kidney disease ([place])   . Weight loss    50 lb since Apr 24, 2015    Past Surgical History:  Procedure Laterality Date  . APPENDECTOMY    . BACK SURGERY    . CHOLECYSTECTOMY    . DIALYSIS/PERMA CATHETER INSERTION N/A 01/22/2018   Procedure: DIALYSIS/PERMA CATHETER INSERTION;  Surgeon: Katha Cabal, MD;  Location: [place] [facility];  Service: Cardiovascular;  Laterality: N/A;  . DIALYSIS/PERMA CATHETER REMOVAL N/A 04/01/2018   Procedure: DIALYSIS/PERMA CATHETER REMOVAL;  Surgeon: Katha Cabal, MD;  Location: [place] [facility];  Service: Cardiovascular;  Laterality: N/A;  . ELECTROPHYSIOLOGIC STUDY N/A 06/17/2015   Procedure: CARDIOVERSION;  Surgeon: Wellington Hampshire, MD;  Location: [facility] ORS;  Service: Cardiovascular;  Laterality: N/A;  . ELECTROPHYSIOLOGIC STUDY N/A 03/30/2016   Procedure: CARDIOVERSION;  Surgeon: Minna Merritts, MD;  Location: [facility] OR[facility];  Service: Cardiovascular;  Laterality: N/A;  . GASTRIC BYPASS    . HEMORRHOID SURGERY    . LOWER EXTREMITY ANGIOGRAPHY Right 01/23/2018   Procedure: LOWER EXTREMITY ANGIOGRAPHY;  Surgeon: Algernon Huxley, MD;  Location: [place] [facility];  Service: Cardiovascular;  Laterality: Right;  . LOWER EXTREMITY ANGIOGRAPHY Left 01/30/2018   Procedure: LOWER EXTREMITY ANGIOGRAPHY;  Surgeon: Algernon Huxley, MD;  Location: [place] [facility];  Service: Cardiovascular;  Laterality: Left;  .  PERIPHERAL VASCULAR BALLOON ANGIOPLASTY Right 04/09/2018   Procedure: PERIPHERAL VASCULAR BALLOON ANGIOPLASTY;  Surgeon: Katha Cabal, MD;  Location: [facility];  Service: Cardiovascular;  Laterality: Right;  . pilonidal cyst    . TEE WITHOUT CARDIOVERSION N/A 06/17/2015   Procedure: TRANSESOPHAGEAL ECHOCARDIOGRAM (TEE);  Surgeon: Wellington Hampshire, MD;  Location: [facility] OR[facility];  Service: Cardiovascular;  Laterality: N/A;    No outpatient medications have been marked as taking for the 04/24/18 encounter (Appointment) with Rise Mu, PA-C.      Allergies:   Aspirin; Tuberculin tests; and Codeine   Social History:  The patient  reports that he quit smoking about 77 years ago. He quit smokeless tobacco use about 3 years ago.  His smokeless tobacco use included chew. He reports that he does not drink alcohol or use drugs.   Family History:  The patient's family history includes CAD in his mother; COPD in his mother; Diabetes in an other family member; Heart failure in his mother; Stroke in his mother.  ROS:   ROS   PHYSICAL EXAM: *** VS:  There were no vitals taken for this visit. BMI: There is no height or weight on file to calculate BMI.  Physical Exam   EKG:  Was ordered and interpreted by me today. Shows ***  Recent Labs: 04/07/2018: B Natriuretic Peptide 537.0 04/08/2018: Magnesium 2.0; TSH 10.522 04/21/2018: ALT 72; BUN 41; Creatinine, Ser 1.49; Hemoglobin 10.7; Platelets 333; Potassium 4.0; Sodium 134  09/13/2017: Cholesterol 92; HDL 44; LDL Cholesterol 37; Total CHOL/HDL Ratio 2.1; Triglycerides 57; VLDL 11   Estimated Creatinine Clearance: 39.5 mL/min (A) (by C-G formula based on SCr of 1.49 mg/dL (H)).   Wt Readings from Last 3 Encounters:  04/21/18 160 lb (72.6 kg)  04/10/18 145 lb 1.6 oz (65.8 kg)  02/27/18 166 lb (75.3 kg)     Other studies reviewed: Additional studies/records reviewed today include: summarized above  ASSESSMENT AND PLAN:  1. ***  Disposition: F/u with Dr. Rockey Situ or an APP in ***  Current medicines are reviewed at length with the patient today.  The patient did not have any concerns regarding medicines.  Signed, Christell Faith, PA-C 04/22/2018 9:23 AM     [facility] [address] [place] [address] [place], [place] [address] [phone]

## 2018-04-22 NOTE — Plan of Care (Signed)
  Problem: Education: Goal: Knowledge of General Education information will improve Description: Including pain rating scale, medication(s)/side effects and non-pharmacologic comfort measures Outcome: Progressing   Problem: Elimination: Goal: Will not experience complications related to bowel motility Outcome: Progressing Goal: Will not experience complications related to urinary retention Outcome: Progressing   Problem: Pain Managment: Goal: General experience of comfort will improve Outcome: Progressing   Problem: Safety: Goal: Ability to remain free from injury will improve Outcome: Progressing   

## 2018-04-22 NOTE — Clinical Social Work Note (Signed)
Clinical Social Work Assessment  Patient Details  Name: Gregory Tyler MRN: [id number] Date of Birth: 1940/05/11  Date of referral:  04/22/18               Reason for consult:  Facility Placement                Permission sought to share information with:  Case Manager, Customer service manager, Family Supports Permission granted to share information::  Yes, Verbal Permission Granted  Name::        Agency::     Relationship::     Contact Information:     Housing/Transportation Living arrangements for the past 2 months:  [place] of Information:  Adult Children Patient Interpreter Needed:  None Criminal Activity/Legal Involvement Pertinent to Current Situation/Hospitalization:  No - Comment as needed Significant Relationships:  Adult Children Lives with:  Facility Resident Do you feel safe going back to the place where you live?  Yes Need for family participation in patient care:  Yes (Comment)  Care giving concerns:  Patient lives at [place] Worker assessment / plan:  CSW consulted for SNF placement. CSW attempted to meet with patient but he is out of the room for a procedure. CSW contacted patient's son Gregory Tyler [phone]. [name] reports that patient is a long term resident at [facility] and has been there since around August of 2019. Per son, patient is Medicaid pending at [facility]. Son reports that patient normally uses a wheelchair and is unable to stand or walk. Son would like patient to return to [facility] when medically ready. Son would like patient to be transported by EMS when medically ready. CSW will continue to follow for discharge planning.   Employment status:  Retired Forensic scientist:  Medicare PT Recommendations:  Not assessed at this time [place] / Referral to community resources:  [place]  Patient/Family's Response to care:  Son thanked CSW for assistance    Patient/Family's Understanding of and Emotional Response to Diagnosis, Current Treatment, and Prognosis:  Son understands current treatment plan and is in agreement.   Emotional Assessment Appearance:  Appears stated age Attitude/Demeanor/Rapport:  Unable to Assess Affect (typically observed):  Unable to Assess Orientation:  Oriented to Self, Oriented to Place Alcohol / Substance use:  Not Applicable Psych involvement (Current and /or in the community):  No (Comment)  Discharge Needs  Concerns to be addressed:  Discharge Planning Concerns Readmission within the last 30 days:  Yes Current discharge risk:  None Barriers to Discharge:  Continued Medical Work up   [employer], [place] 04/22/2018, 3:41 PM

## 2018-04-23 ENCOUNTER — Encounter: Payer: Self-pay | Admitting: Vascular Surgery

## 2018-04-23 LAB — CBC
HEMATOCRIT: 30.2 % — AB (ref 39.0–52.0)
Hemoglobin: 9.3 g/dL — ABNORMAL LOW (ref 13.0–17.0)
MCH: 28.4 pg (ref 26.0–34.0)
MCHC: 30.8 g/dL (ref 30.0–36.0)
MCV: 92.4 fL (ref 80.0–100.0)
Platelets: 280 10*3/uL (ref 150–400)
RBC: 3.27 MIL/uL — ABNORMAL LOW (ref 4.22–5.81)
RDW: 15.1 % (ref 11.5–15.5)
WBC: 10.8 10*3/uL — ABNORMAL HIGH (ref 4.0–10.5)
nRBC: 0 % (ref 0.0–0.2)

## 2018-04-23 LAB — BASIC METABOLIC PANEL
ANION GAP: 7 (ref 5–15)
BUN: 36 mg/dL — ABNORMAL HIGH (ref 8–23)
CO2: 22 mmol/L (ref 22–32)
Calcium: 8 mg/dL — ABNORMAL LOW (ref 8.9–10.3)
Chloride: 106 mmol/L (ref 98–111)
Creatinine, Ser: 1.51 mg/dL — ABNORMAL HIGH (ref 0.61–1.24)
GFR calc Af Amer: 51 mL/min — ABNORMAL LOW (ref >60)
GFR calc non Af Amer: 44 mL/min — ABNORMAL LOW (ref >60)
Glucose, Bld: 178 mg/dL — ABNORMAL HIGH (ref 70–99)
Potassium: 3.9 mmol/L (ref 3.5–5.1)
Sodium: 135 mmol/L (ref 135–145)

## 2018-04-23 LAB — GLUCOSE, CAPILLARY
Glucose-Capillary: 174 mg/dL — ABNORMAL HIGH (ref 70–99)
Glucose-Capillary: 205 mg/dL — ABNORMAL HIGH (ref 70–99)
Glucose-Capillary: 229 mg/dL — ABNORMAL HIGH (ref 70–99)
Glucose-Capillary: 189 mg/dL — ABNORMAL HIGH (ref 70–99)

## 2018-04-23 MED ORDER — APIXABAN 5 MG PO TABS
5.0000 mg | ORAL_TABLET | Freq: Two times a day (BID) | ORAL | Status: DC
  Administered 2018-04-23 – 2018-04-28 (×10): 5 mg via ORAL
  Filled 2018-04-23 (×11): qty 1

## 2018-04-23 NOTE — Progress Notes (Signed)
    Subjective  - POD #1, s/p I&D right calf  Having a bad day, questioning if he wants to live any more   Physical Exam:  Wound vac in place Both feet warm   Cx:Few GPC AND gram variable rods, too young to read    Assessment/Plan:  POD #1  ID:  Cultures pending, remains on Vanco and Maxipime Continue wound vac Routine wound care to other areas  [place] Sharonna Vinje 04/23/2018 2:18 PM --  Vitals:   04/23/18 0855 04/23/18 1303  BP: 107/76 103/67  Pulse: 88 (!) 103  Resp:  18  Temp:  98.7 F (37.1 C)  SpO2: 99% 96%    Intake/Output Summary (Last 24 hours) at 04/23/2018 1418 Last data filed at 04/23/2018 1357 Gross per 24 hour  Intake 1677 ml  Output 1100 ml  Net 577 ml     Laboratory CBC    Component Value Date/Time   WBC 10.8 (H) 04/23/2018 0402   HGB 9.3 (L) 04/23/2018 0402   HGB 12.2 (L) 03/27/2016 1634   HCT 30.2 (L) 04/23/2018 0402   HCT 37.6 03/27/2016 1634   PLT 280 04/23/2018 0402   PLT 243 03/27/2016 1634    BMET    Component Value Date/Time   NA 135 04/23/2018 0402   NA 139 03/27/2016 1634   K 3.9 04/23/2018 0402   CL 106 04/23/2018 0402   CO2 22 04/23/2018 0402   GLUCOSE 178 (H) 04/23/2018 0402   BUN 36 (H) 04/23/2018 0402   BUN 29 (H) 03/27/2016 1634   CREATININE 1.51 (H) 04/23/2018 0402   CALCIUM 8.0 (L) 04/23/2018 0402   GFRNONAA 44 (L) 04/23/2018 0402   GFRAA 51 (L) 04/23/2018 0402    COAG Lab Results  Component Value Date   INR 1.30 04/07/2018   INR 1.46 01/18/2018   INR 1.75 11/19/2017   No results found for: PTT  Antibiotics Anti-infectives (From admission, onward)   Start     Dose/Rate Route Frequency Ordered Stop   04/22/18 0600  vancomycin (VANCOCIN) IVPB 1000 mg/200 mL premix     1,000 mg 200 mL/hr over 60 Minutes Intravenous Every 24 hours 04/21/18 1909     04/21/18 2200  ceFEPIme (MAXIPIME) 1 g in sodium chloride 0.9 % 100 mL IVPB     1 g 200 mL/hr over 30 Minutes Intravenous Every 12 hours 04/21/18 1905     04/21/18 1800  vancomycin (VANCOCIN) IVPB 1000 mg/200 mL premix     1,000 mg 200 mL/hr over 60 Minutes Intravenous  Once 04/21/18 1750 04/21/18 1948       V. Leia Alf, M.D. [facility] Office: [phone] Pager:  [phone]

## 2018-04-23 NOTE — Progress Notes (Signed)
Gregory, Tyler ([id number]) Visit Report for 04/21/2018 Arrival Information Details Patient Name: Gregory Tyler, Gregory Tyler. Date of Service: 04/21/2018 2:00 PM Medical Record Number: [id number] Patient Account Number: [id number] Date of Birth/Sex: 1940/07/20 (78 y.o. M) Treating RN: Harold Barban Primary Care Velton Roselle: Fulton Reek Other Clinician: Referring Laquanda Bick: Fulton Reek Treating Cindra Austad/Extender: Oneida Arenas in Treatment: 8 Visit Information History Since Last Visit Added or deleted any medications: No Patient Arrived: Wheel Chair Any new allergies or adverse reactions: No Arrival Time: 14:25 Had a fall or experienced change in No Accompanied By: self activities of daily living that may affect Transfer Assistance: Harrel Lemon Lift risk of falls: Patient Identification Verified: Yes Signs or symptoms of abuse/neglect since last visito No Secondary Verification Process Yes Hospitalized since last visit: No Completed: Has Dressing in Place as Prescribed: Yes Patient Has Alerts: Yes Pain Present Now: Yes Patient Alerts: Patient on Blood Thinner Eliquis Diabetic Type II Electronic Signature(s) Signed: 04/22/2018 4:38:01 PM By: Harold Barban Entered By: Harold Barban on 04/21/2018 14:37:37 Norton, [name] ([id number]) -------------------------------------------------------------------------------- Clinic Level of Care Assessment Details Patient Name: Gregory Tyler. Date of Service: 04/21/2018 2:00 PM Medical Record Number: [id number] Patient Account Number: [id number] Date of Birth/Sex: 12-13-1940 (78 y.o. M) Treating RN: Harold Barban Primary Care Graylee Arutyunyan: Fulton Reek Other Clinician: Referring Bladimir Auman: Fulton Reek Treating Airen Stiehl/Extender: Oneida Arenas in Treatment: 8 Clinic Level of Care Assessment Items TOOL 4 Quantity Score []  - Use when only an EandM is performed on FOLLOW-UP visit 0 ASSESSMENTS - Nursing  Assessment / Reassessment X - Reassessment of Co-morbidities (includes updates in patient status) 1 10 X- 1 5 Reassessment of Adherence to Treatment Plan ASSESSMENTS - Wound and Skin Assessment / Reassessment []  - Simple Wound Assessment / Reassessment - one wound 0 X- 7 5 Complex Wound Assessment / Reassessment - multiple wounds []  - 0 Dermatologic / Skin Assessment (not related to wound area) ASSESSMENTS - Focused Assessment []  - Circumferential Edema Measurements - multi extremities 0 []  - 0 Nutritional Assessment / Counseling / Intervention []  - 0 Lower Extremity Assessment (monofilament, tuning fork, pulses) []  - 0 Peripheral Arterial Disease Assessment (using hand held doppler) ASSESSMENTS - Ostomy and/or Continence Assessment and Care []  - Incontinence Assessment and Management 0 []  - 0 Ostomy Care Assessment and Management (repouching, etc.) PROCESS - Coordination of Care []  - Simple Patient / Family Education for ongoing care 0 X- 1 20 Complex (extensive) Patient / Family Education for ongoing care X- 1 10 Staff obtains Consents, Records, Test Results / Process Orders X- 1 10 Staff telephones HHA, Nursing Homes / Clarify orders / etc X- 1 10 Routine Transfer to another Facility (non-emergent condition) []  - 0 Routine Hospital Admission (non-emergent condition) []  - 0 New Admissions / Biomedical engineer / Ordering NPWT, Apligraf, etc. []  - 0 Emergency Hospital Admission (emergent condition) []  - 0 Simple Discharge Coordination OREST, DYGERT. ([id number]) X- 1 15 Complex (extensive) Discharge Coordination PROCESS - Special Needs []  - Pediatric / Minor Patient Management 0 []  - 0 Isolation Patient Management []  - 0 Hearing / Language / Visual special needs []  - 0 Assessment of Community assistance (transportation, D/C planning, etc.) []  - 0 Additional assistance / Altered mentation []  - 0 Support Surface(s) Assessment (bed, cushion, seat,  etc.) INTERVENTIONS - Wound Cleansing / Measurement []  - Simple Wound Cleansing - one wound 0 X- 7 5 Complex Wound Cleansing - multiple wounds []  - 0 Wound Imaging (photographs - any number of wounds) []  -  0 Wound Tracing (instead of photographs) []  - 0 Simple Wound Measurement - one wound X- 7 5 Complex Wound Measurement - multiple wounds INTERVENTIONS - Wound Dressings []  - Small Wound Dressing one or multiple wounds 0 X- 7 15 Medium Wound Dressing one or multiple wounds []  - 0 Large Wound Dressing one or multiple wounds []  - 0 Application of Medications - topical []  - 0 Application of Medications - injection INTERVENTIONS - Miscellaneous []  - External ear exam 0 []  - 0 Specimen Collection (cultures, biopsies, blood, body fluids, etc.) []  - 0 Specimen(s) / Culture(s) sent or taken to Lab for analysis X- 1 10 Patient Transfer (multiple staff / Civil Service fast streamer / Similar devices) []  - 0 Simple Staple / Suture removal (25 or less) []  - 0 Complex Staple / Suture removal (26 or more) []  - 0 Hypo / Hyperglycemic Management (close monitor of Blood Glucose) []  - 0 Ankle / Brachial Index (ABI) - do not check if billed separately X- 1 5 Vital Signs Moretto, Marquett R. ([id number]) Has the patient been seen at the hospital within the last three years: Yes Total Score: 305 Level Of Care: New/Established - Level 5 Electronic Signature(s) Signed: 04/22/2018 4:38:01 PM By: Harold Barban Entered By: Harold Barban on 04/22/2018 08:02:07 Gregory Tyler ([id number]) -------------------------------------------------------------------------------- Encounter Discharge Information Details Patient Name: ABBY, Tyler. Date of Service: 04/21/2018 2:00 PM Medical Record Number: [id number] Patient Account Number: [id number] Date of Birth/Sex: [birth year] (78 y.o. M) Treating RN: Harold Barban Primary Care Ferol Laiche: Fulton Reek Other Clinician: Referring Shantel Wesely: Fulton Reek Treating Andrea Colglazier/Extender: Oneida Arenas in Treatment: 8 Encounter Discharge Information Items Discharge Condition: Stable Ambulatory Status: Wheelchair Discharge Destination: Hospital Telephoned: No Orders Sent: Yes Transportation: Ambulance Accompanied By: self Schedule Follow-up Appointment: Yes Clinical Summary of Care: Electronic Signature(s) Signed: 04/22/2018 4:38:01 PM By: Harold Barban Entered By: Harold Barban on 04/22/2018 15:21:59 Gregory Tyler ([id number]) -------------------------------------------------------------------------------- Lower Extremity Assessment Details Patient Name: UNNAMED, ZEIEN. Date of Service: 04/21/2018 2:00 PM Medical Record Number: [id number] Patient Account Number: [id number] Date of Birth/Sex: Sep 13, 1940 (78 y.o. M) Treating RN: Harold Barban Primary Care Borden Thune: Fulton Reek Other Clinician: Referring Georganne Siple: Fulton Reek Treating Arend Bahl/Extender: Beather Arbour Weeks in Treatment: 8 Edema Assessment Assessed: Shirlyn Goltz: No] [Right: No] Edema: [Left: Yes] [Right: Yes] Calf Left: Right: Point of Measurement: 30 cm From Medial Instep cm cm Ankle Left: Right: Point of Measurement: 10 cm From Medial Instep cm cm Vascular Assessment Pulses: Dorsalis Pedis Palpable: [Left:No] [Right:No] Posterior Tibial Palpable: [Left:No] [Right:No] Extremity colors, hair growth, and conditions: Hair Growth on Extremity: [Left:No] [Right:No] Temperature of Extremity: [Left:Cold] [Right:Cold] Capillary Refill: [Left:> 3 seconds] [Right:> 3 seconds] Toe Nail Assessment Left: Right: Thick: Yes Yes Discolored: Yes Yes Deformed: Yes Yes Improper Length and Hygiene: Yes Yes Electronic Signature(s) Signed: 04/22/2018 4:38:01 PM By: Harold Barban Entered By: Harold Barban on 04/21/2018 15:09:03 Gregory Tyler  ([id number]) -------------------------------------------------------------------------------- Multi Wound Chart Details Patient Name: Gregory Tyler. Date of Service: 04/21/2018 2:00 PM Medical Record Number: [id number] Patient Account Number: [id number] Date of Birth/Sex: 01/19/1941 (78 y.o. 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] [5:Pressure Ulcer] [6:Pressure Ulcer] Secondary Etiology: [1:Diabetic Wound/Ulcer of the Lower Extremity] [5:N/A] [6:N/A] Comorbid History: [1:Anemia, Arrhythmia, Hypertension, Type II Diabetes, End Stage Renal Disease, Received Radiation] [5:Anemia, Arrhythmia, Hypertension, Type II Diabetes, End Stage Renal Disease, Received Radiation] [6:Anemia, Arrhythmia, Hypertension,  Type II Diabetes, End Stage Renal Disease, Received Radiation] Date Acquired: [1:12/23/2017] [5:01/20/2018] [6:01/27/2018] Weeks of Treatment: [1:8] [5:8] [6:8] Wound Status: [1:Open] [5:Open] [6:Open] Clustered Wound: [1:No] [5:Yes] [6:Yes] Clustered Quantity: [1:N/A] [5:N/A] [6:2] Measurements L x W x D [1:2x2x0.1] [5:1x3x0.1] [6:4x2.5x0.5] (cm) Area (cm) : [1:3.142] [5:2.356] [6:7.854] Volume (cm) : [1:0.314] [5:0.236] [6:3.927] % Reduction in Area: [1:-1503.10%] [5:-613.90%] [6:-495.50%] % Reduction in Volume: [1:-1470.00%] [5:-615.20%] [6:-891.70%] Classification: [1:Category/Stage II] [5:Category/Stage III] [6:Category/Stage  III] Exudate Amount: [1:Medium] [5:Medium] [6:Large] Exudate Type: [1:Serosanguineous] [5:Serosanguineous] [6:Serosanguineous] Exudate Color: [1:red, brown] [5:red, brown] [6:red, brown] Wound Margin: [1:Flat and Intact] [5:Flat and Intact] [6:Flat and Intact] Granulation Amount: [1:None Present (0%)] [5:Small (1-33%)] [6:Small (1-33%)] Granulation Quality: [1:N/A] [5:Red] [6:Pink] Necrotic Amount: [1:Medium (34-66%)] [5:Medium (34-66%)] [6:Large (67-100%)] Necrotic Tissue: [1:Adherent Slough] [5:Adherent Slough] [6:Eschar, Adherent Slough] Exposed Structures: [1:Fat Layer (Subcutaneous Tissue) Exposed: Yes Fascia: No Tendon: No Muscle: No Joint: No Bone: No] [5:Fat Layer (Subcutaneous Tissue) Exposed: Yes Fascia: No Tendon: No Muscle: No Joint: No Bone: No] [6:Fat Layer (Subcutaneous Tissue) Exposed: Yes  Fascia: No Tendon: No Muscle: No Joint: No Bone: No] Epithelialization: None None Small (1-33%) Periwound Skin Texture: Excoriation: Yes Scarring: Yes Scarring: Yes Induration: Yes Excoriation: No Excoriation: No Callus: Yes Induration: No Induration: No Crepitus: Yes Callus: No Callus: No Rash: Yes Crepitus: No Crepitus: No Scarring: Yes Rash: No Rash: No Periwound Skin Moisture: Maceration: Yes Maceration: No Maceration: No Dry/Scaly: Yes Dry/Scaly: No Dry/Scaly: No Periwound Skin Color: Atrophie Blanche: Yes Hemosiderin Staining: Yes Atrophie Blanche: No Cyanosis: Yes Atrophie Blanche: No Cyanosis: No Ecchymosis: Yes Cyanosis: No Ecchymosis: No Erythema: Yes Ecchymosis: No Erythema: No Hemosiderin Staining: Yes Erythema: No Hemosiderin Staining: No Mottled: Yes Mottled: No Mottled: No Pallor: Yes Pallor: No Pallor: No Rubor: Yes Rubor: No Rubor: No Erythema Location: N/A N/A N/A Temperature: No Abnormality N/A N/A Tenderness on Palpation: Yes No No Wound Preparation: Ulcer Cleansing: Ulcer Cleansing: Ulcer Cleansing: Rinsed/Irrigated with  Saline Rinsed/Irrigated with Saline Rinsed/Irrigated with Saline Topical Anesthetic Applied: Topical Anesthetic Applied: Topical Anesthetic Applied: Other: lidocaine 4% None None Wound Number: 8 [id number] N/A Photos: No Photos N/A Wound Location: Right Lower Leg - Lateral Right Gluteus N/A Wounding Event: Not Known Pressure Injury N/A Primary Etiology: Pressure Ulcer Pressure Ulcer N/A Secondary Etiology: N/A N/A N/A Comorbid History: Anemia, Arrhythmia, Anemia, Arrhythmia, N/A Hypertension, Type II Hypertension, Type II Diabetes, End Stage Renal Diabetes, End Stage Renal Disease, Received Radiation Disease, Received Radiation Date Acquired: 03/20/2018 04/21/2018 N/A Weeks of Treatment: 4 0 N/A Wound Status: Open Open N/A Clustered Wound: No No N/A Clustered Quantity: N/A N/A N/A Measurements L x W x D 8x5.8x0.6 3x2x0.1 N/A (cm) Area (cm) : 36.442 4.712 N/A Volume (cm) : 21.865 0.471 N/A % Reduction in Area: -1971.70% 0.00% N/A % Reduction in Volume: -12323.30% 0.00% N/A Classification: Category/Stage III Category/Stage III N/A Exudate Amount: Small Large N/A Exudate Type: Purulent Serosanguineous N/A CHRISTION, LEONHARD R. ([id number]) Exudate Color: yellow, brown, green red, brown N/A Wound Margin: Thickened Thickened N/A Granulation Amount: None Present (0%) None Present (0%) N/A Granulation Quality: N/A N/A N/A Necrotic Amount: Large (67-100%) Large (67-100%) N/A Necrotic Tissue: Eschar, Adherent Slough Eschar N/A Exposed Structures: Fat Layer (Subcutaneous Fat Layer (Subcutaneous N/A Tissue) Exposed: Yes  Tissue) Exposed: Yes Fascia: No Fascia: No Tendon: No Tendon: No Muscle: No Muscle: No Joint: No Joint: No Bone: No Bone: No Epithelialization: None None N/A Periwound Skin Texture: Excoriation: No No Abnormalities Noted N/A Induration: No Callus: No Crepitus: No Rash: No Scarring: No Periwound Skin Moisture: Maceration: No No Abnormalities Noted N/A Dry/Scaly:  No Periwound Skin Color: Erythema: Yes Hemosiderin Staining: Yes N/A Hemosiderin Staining: Yes Atrophie Blanche: No Cyanosis: No Ecchymosis: No Mottled: No Pallor: No Rubor: No Erythema Location: Circumferential N/A N/A Temperature: N/A N/A N/A Tenderness on Palpation: No No N/A Wound Preparation: Ulcer Cleansing: Ulcer Cleansing: N/A Rinsed/Irrigated with Saline Rinsed/Irrigated with Saline Treatment Notes Electronic Signature(s) Signed: 04/21/2018 10:32:40 PM By: Beather Arbour FNP-C Entered By: Beather Arbour on 04/21/2018 15:11:25 Gregory Tyler ([id number]) -------------------------------------------------------------------------------- Pain Assessment Details Patient Name: Gregory Tyler. Date of Service: 04/21/2018 2:00 PM Medical Record Number: [id number] Patient Account Number: [id number] Date of Birth/Sex: 11/03/1940 (78 y.o. M) Treating RN: Harold Barban Primary Care Katiria Calame: Fulton Reek Other Clinician: Referring Yvana Samonte: Fulton Reek Treating Madaline Lefeber/Extender: Oneida Arenas in Treatment: 8 Active Problems Location of Pain Severity and Description of Pain Patient Has Paino Yes Site Locations Rate the pain. Current Pain Level: Insensate Pain Management and Medication Current Pain Management: Electronic Signature(s) Signed: 04/22/2018 4:38:01 PM By: Harold Barban Entered By: Harold Barban on 04/21/2018 14:38:07 Gregory Tyler ([id number]) -------------------------------------------------------------------------------- Patient/Caregiver Education Details Patient Name: Gregory Tyler. Date of Service: 04/21/2018 2:00 PM Medical Record Number: [id number] Patient Account Number: [id number] Date of Birth/Gender: 1940/09/13 (78 y.o. M) Treating RN: Harold Barban Primary Care Physician: Fulton Reek Other Clinician: Referring Physician: Fulton Reek Treating Physician/Extender: Oneida Arenas in Treatment:  8 Education Assessment Education Provided To: Patient Education Topics Provided Electronic Signature(s) Signed: 04/22/2018 4:38:01 PM By: Harold Barban Entered By: Harold Barban on 04/22/2018 08:02:22 HINES, KLOSS ([id number]) -------------------------------------------------------------------------------- Wound Assessment Details Patient Name: DESMUND, ELMAN. Date of Service: 04/21/2018 2:00 PM Medical Record Number: [id number] Patient Account Number: [id number] Date of Birth/Sex: 07-Oct-1940 (78 y.o. 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 Wounding Event: Pressure Injury Etiology: Date Acquired: 12/23/2017 Wound Open Weeks Of Treatment: 8 Status: Clustered Wound: No Comorbid Anemia, Arrhythmia, Hypertension, Type II History: Diabetes, End Stage Renal Disease, Received Radiation Photos Photo Uploaded By: Gretta Cool, BSN, RN, CWS, Kim on 04/22/2018 07:49:17 Wound Measurements Length: (cm) 2 Width: (cm) 2 Depth: (cm) 0.1 Area: (cm) 3.142 Volume: (cm) 0.314 % Reduction in Area: -1503.1% % Reduction in Volume: -1470% Epithelialization: None Tunneling: No Undermining: No Wound Description Classification: Category/Stage II Foul Odor Wound Margin: Flat and Intact Slough/Fi Exudate Amount: Medium Exudate Type: Serosanguineous Exudate Color: red, brown After Cleansing: No brino Yes Wound Bed Granulation Amount: None Present (0%) Exposed Structure Necrotic Amount: Medium (34-66%) Fascia Exposed: No Necrotic Quality: Adherent Slough Fat Layer (Subcutaneous Tissue) Exposed: Yes Tendon Exposed: No Muscle Exposed: No Joint Exposed: No Bone Exposed: No Charles,  Capers R. ([id number]) Periwound Skin Texture Texture Color No Abnormalities Noted: No No Abnormalities Noted: No Callus: Yes Atrophie Blanche: Yes Crepitus: Yes Cyanosis: Yes Excoriation: Yes Ecchymosis: Yes Induration: Yes Erythema: Yes Rash: Yes Hemosiderin Staining: Yes Scarring: Yes Mottled: Yes Pallor: Yes Moisture Rubor: Yes No Abnormalities Noted: No Dry / Scaly: Yes Temperature / Pain Maceration: Yes Temperature: No  Abnormality Tenderness on Palpation: Yes Wound Preparation Ulcer Cleansing: Rinsed/Irrigated with Saline Topical Anesthetic Applied: Other: lidocaine 4%, Treatment Notes Wound #1 (Left Calcaneus) Notes BFD all wounds, heels cups to calcaneous bilaterally Electronic Signature(s) Signed: 04/22/2018 4:38:01 PM By: Harold Barban Entered By: Harold Barban on 04/21/2018 14:58:32 Kott, [name] ([id number]) -------------------------------------------------------------------------------- Wound Assessment Details Patient Name: Gregory Tyler. Date of Service: 04/21/2018 2:00 PM Medical Record Number: [id number] Patient Account Number: [id number] Date of Birth/Sex: [birth year] (78 y.o. M) Treating RN: Harold Barban Primary Care Cherlynn Popiel: Fulton Reek Other Clinician: Referring Akansha Wyche: Fulton Reek Treating Nashla Althoff/Extender: Beather Arbour Weeks in Treatment: 8 Wound Status Wound Number: 10 Primary Abscess Etiology: Wound Location: Abdomen - midline Wound Open Wounding Event: Gradually Appeared Status: Date Acquired: 04/21/2018 Comorbid Anemia, Arrhythmia, Hypertension, Type II Weeks Of Treatment: 0 History: Diabetes, End Stage Renal Disease, Received Clustered Wound: No Radiation Wound Measurements Length: (cm) 2 Width: (cm) 1.4 Depth: (cm) 0.4 Area: (cm) 2.199 Volume: (cm) 0.88 % Reduction in Area: % Reduction in Volume: Epithelialization: None Tunneling: No Undermining: No Wound Description Full Thickness  Without Exposed Support Foul Odo Classification: Structures Slough/F Wound Margin: Flat and Intact Exudate Large Amount: Exudate Type: Purulent Exudate Color: yellow, brown, green r After Cleansing: No ibrino Yes Wound Bed Granulation Amount: None Present (0%) Exposed Structure Necrotic Amount: Medium (34-66%) Fascia Exposed: No Necrotic Quality: Adherent Slough Fat Layer (Subcutaneous Tissue) Exposed: Yes Tendon Exposed: No Muscle Exposed: No Joint Exposed: No Bone Exposed: No Periwound Skin Texture Texture Color No Abnormalities Noted: No No Abnormalities Noted: No Callus: No Atrophie Blanche: No Crepitus: No Cyanosis: No Excoriation: No Ecchymosis: No Induration: No Erythema: No Rash: No Hemosiderin Staining: No Scarring: No Mottled: No Pallor: No Moisture Rubor: No No Abnormalities Noted: No PARV, MANTHEY ([id number]) Dry / Scaly: No Temperature / Pain Maceration: No Temperature: No Abnormality Tenderness on Palpation: Yes Wound Preparation Ulcer Cleansing: Rinsed/Irrigated with Saline Topical Anesthetic Applied: None Treatment Notes Wound #10 (Abdomen - midline) Notes BFD all wounds, heels cups to calcaneous bilaterally Electronic Signature(s) Signed: 04/22/2018 4:38:01 PM By: Harold Barban Entered By: Harold Barban on 04/21/2018 17:34:30 Gregory Tyler ([id number]) -------------------------------------------------------------------------------- Wound Assessment Details Patient Name: Gregory Tyler. Date of Service: 04/21/2018 2:00 PM Medical Record Number: [id number] Patient Account Number: [id number] Date of Birth/Sex: April 03, 1941 (78 y.o. M) Treating RN: Harold Barban Primary Care Kelon Easom: Fulton Reek Other Clinician: Referring Bretton Tandy: Fulton Reek Treating Donyel Nester/Extender: Beather Arbour Weeks in Treatment: 8 Wound Status Wound Number: 11 Primary Abscess Etiology: Wound Location: Right Abdomen - Lower  Quadrant Wound Open Wounding Event: Gradually Appeared Status: Date Acquired: 04/21/2018 Comorbid Anemia, Arrhythmia, Hypertension, Type II Weeks Of Treatment: 0 History: Diabetes, End Stage Renal Disease, Received Clustered Wound: No Radiation Wound Measurements Length: (cm) 0.5 Width: (cm) 0.5 Depth: (cm) 0.2 Area: (cm) 0.196 Volume: (cm) 0.039 % Reduction in Area: % Reduction in Volume: Epithelialization: None Tunneling: No Undermining: No Wound Description Classification: Partial Thickness Wound Margin: Flat and Intact Exudate Amount: Small Exudate Type: Serosanguineous Exudate Color: red, brown Foul Odor After Cleansing: No Slough/Fibrino Yes Wound Bed Granulation Amount: None Present (0%) Exposed Structure Necrotic Amount: Medium (34-66%) Fascia Exposed: No Necrotic Quality: Adherent Slough Fat Layer (Subcutaneous Tissue) Exposed: Yes Tendon Exposed: No Muscle Exposed: No Joint Exposed: No Bone Exposed: No Periwound Skin Texture Texture Color No Abnormalities Noted: No No Abnormalities Noted: No Moisture No Abnormalities Noted: No Wound Preparation Ulcer Cleansing: Rinsed/Irrigated with Saline Topical Anesthetic Applied: None Treatment Notes Wound #11 (Right Abdomen -  Lower Quadrant) LAMOUNT, BANKSON ([id number]) Notes BFD all wounds, heels cups to calcaneous bilaterally Electronic Signature(s) Signed: 04/22/2018 4:38:01 PM By: Harold Barban Entered By: Harold Barban on 04/21/2018 17:37:19 Cowell, [name] ([id number]) -------------------------------------------------------------------------------- Wound Assessment Details Patient Name: Gregory Tyler. Date of Service: 04/21/2018 2:00 PM Medical Record Number: [id number] Patient Account Number: [id number] Date of Birth/Sex: March 07, 1941 (78 y.o. 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 Wound Open Wounding Event: Pressure Injury Status: Date Acquired: 01/20/2018 Comorbid Anemia, Arrhythmia, Hypertension, Type II Weeks Of Treatment: 8 History: Diabetes, End Stage Renal Disease, Received Clustered Wound: Yes Radiation Photos Photo Uploaded By: Gretta Cool, BSN, RN, CWS, Kim on 04/22/2018 07:49:18 Wound Measurements Length: (cm) 1 Width: (cm) 3 Depth: (cm) 0.1 Area: (cm) 2.356 Volume: (cm) 0.236 % Reduction in Area: -613.9% % Reduction in Volume: -615.2% Epithelialization: None Tunneling: No Undermining: No Wound Description Classification: Category/Stage III Wound Margin: Flat and Intact Exudate Amount: Medium Exudate Type: Serosanguineous Exudate Color: red, brown Foul Odor After Cleansing: No Slough/Fibrino Yes Wound Bed Granulation Amount: Small (1-33%) Exposed Structure Granulation Quality: Red Fascia Exposed: No Necrotic Amount: Medium (34-66%) Fat Layer (Subcutaneous Tissue) Exposed: Yes Necrotic Quality: Adherent Slough Tendon Exposed: No Muscle Exposed: No Joint Exposed: No Bone Exposed: No Periwound Skin Texture BRYNDEN, THUNE R. ([id number]) Texture Color No Abnormalities Noted: No No Abnormalities Noted: No Callus: No Atrophie Blanche: No Crepitus: No Cyanosis: No Excoriation: No Ecchymosis: No Induration: No Erythema: No Rash: No Hemosiderin Staining: Yes Scarring: Yes Mottled: No Pallor: No Moisture Rubor: No No Abnormalities Noted: No Dry / Scaly: No Maceration: No Wound Preparation Ulcer Cleansing: Rinsed/Irrigated with Saline Topical Anesthetic Applied: None Treatment Notes Wound #5 (Left Gluteus) Notes BFD all wounds, heels cups to calcaneous bilaterally Electronic Signature(s) Signed: 04/22/2018 4:38:01 PM By: Harold Barban Entered By: Harold Barban on  04/21/2018 14:59:28 Cedotal, [name] ([id number]) -------------------------------------------------------------------------------- Wound Assessment Details Patient Name: Gregory Tyler. Date of Service: 04/21/2018 2:00 PM Medical Record Number: [id number] Patient Account Number: [id number] Date of Birth/Sex: Dec 20, 1940 (78 y.o. M) Treating RN: Harold Barban Primary Care Eupha Lobb: Fulton Reek Other Clinician: Referring Zaden Sako: Fulton Reek Treating Taleen Prosser/Extender: Beather Arbour Weeks in Treatment: 8 Wound Status Wound Number: 6 Primary Pressure Ulcer Etiology: Wound Location: Sacrum - Medial Wound Open Wounding Event: Gradually Appeared Status: Date Acquired: 01/27/2018 Comorbid Anemia, Arrhythmia, Hypertension, Type II Weeks Of Treatment: 8 History: Diabetes, End Stage Renal Disease, Received Clustered Wound: Yes Radiation Photos Photo Uploaded By: Gretta Cool, BSN, RN, CWS, Kim on 04/22/2018 07:52:07 Wound Measurements Length: (cm) 4 Width: (cm) 2.5 Depth: (cm) 0.5 Clustered Quantity: 2 Area: (cm) 7.854 Volume: (cm) 3.927 % Reduction in Area: -495.5% % Reduction in Volume: -891.7% Epithelialization: Small (1-33%) Tunneling: No Undermining: No Wound Description Classification: Category/Stage III Foul Odor Wound Margin: Flat and Intact Slough/Fi Exudate Amount: Large Exudate Type: Serosanguineous Exudate Color: red, brown After Cleansing: No brino Yes Wound Bed Granulation Amount: Small (1-33%) Exposed Structure Granulation Quality: Pink Fascia Exposed: No Necrotic Amount: Large (67-100%) Fat Layer (Subcutaneous Tissue) Exposed: Yes Necrotic Quality: Eschar, Adherent Slough Tendon Exposed: No Muscle Exposed: No Joint Exposed: No Bone Exposed: No Villamor, Manny R. ([id number]) Periwound Skin Texture Texture Color No Abnormalities Noted: No No Abnormalities Noted: No Callus: No Atrophie Blanche: No Crepitus: No Cyanosis:  No Excoriation: No Ecchymosis: No Induration: No Erythema:  No Rash: No Hemosiderin Staining: No Scarring: Yes Mottled: No Pallor: No Moisture Rubor: No No Abnormalities Noted: No Dry / Scaly: No Maceration: No Wound Preparation Ulcer Cleansing: Rinsed/Irrigated with Saline Topical Anesthetic Applied: None Treatment Notes Wound #6 (Medial Sacrum) Notes BFD all wounds, heels cups to calcaneous bilaterally Electronic Signature(s) Signed: 04/22/2018 4:38:01 PM By: Harold Barban Entered By: Harold Barban on 04/21/2018 15:00:02 Gregory Tyler ([id number]) -------------------------------------------------------------------------------- Wound Assessment Details Patient Name: Gregory Tyler. Date of Service: 04/21/2018 2:00 PM Medical Record Number: [id number] Patient Account Number: [id number] Date of Birth/Sex: 01/11/1941 (78 y.o. M) Treating RN: Harold Barban Primary Care Makahla Kiser: Fulton Reek Other Clinician: Referring Xaiver Roskelley: Fulton Reek Treating Jasman Murri/Extender: Beather Arbour Weeks in Treatment: 8 Wound Status Wound Number: 8 Primary Pressure Ulcer Etiology: Wound Location: Right Lower Leg - Lateral Wound Open Wounding Event: Not Known Status: Date Acquired: 03/20/2018 Comorbid Anemia, Arrhythmia, Hypertension, Type II Weeks Of Treatment: 4 History: Diabetes, End Stage Renal Disease, Received Clustered Wound: No Radiation Photos Photo Uploaded By: Gretta Cool, BSN, RN, CWS, Kim on 04/21/2018 15:07:42 Wound Measurements Length: (cm) 8 Width: (cm) 5.8 Depth: (cm) 0.6 Area: (cm) 36.442 Volume: (cm) 21.865 % Reduction in Area: -1971.7% % Reduction in Volume: -12323.3% Epithelialization: None Tunneling: No Undermining: No Wound Description Classification: Category/Stage III Foul Odor Wound Margin: Thickened Slough/Fi Exudate Amount: Small Exudate Type: Purulent Exudate Color: yellow, brown, green After Cleansing: No brino  Yes Wound Bed Granulation Amount: None Present (0%) Exposed Structure Necrotic Amount: Large (67-100%) Fascia Exposed: No Necrotic Quality: Eschar, Adherent Slough Fat Layer (Subcutaneous Tissue) Exposed: Yes Tendon Exposed: No Muscle Exposed: No Joint Exposed: No Bone Exposed: No Periwound Skin Texture CURTIES, CONIGLIARO R. ([id number]) Texture Color No Abnormalities Noted: No No Abnormalities Noted: No Callus: No Atrophie Blanche: No Crepitus: No Cyanosis: No Excoriation: No Ecchymosis: No Induration: No Erythema: Yes Rash: No Erythema Location: Circumferential Scarring: No Hemosiderin Staining: Yes Mottled: No Moisture Pallor: No No Abnormalities Noted: No Rubor: No Dry / Scaly: No Maceration: No Wound Preparation Ulcer Cleansing: Rinsed/Irrigated with Saline Treatment Notes Wound #8 (Right, Lateral Lower Leg) Notes BFD all wounds, heels cups to calcaneous bilaterally Electronic Signature(s) Signed: 04/22/2018 4:38:01 PM By: Harold Barban Entered By: Harold Barban on 04/21/2018 15:00:52 Gregory Tyler ([id number]) -------------------------------------------------------------------------------- Wound Assessment Details Patient Name: Gregory Tyler. Date of Service: 04/21/2018 2:00 PM Medical Record Number: [id number] Patient Account Number: [id number] Date of Birth/Sex: 23-May-1940 (78 y.o. M) Treating RN: Harold Barban Primary Care Brayla Pat: Fulton Reek Other Clinician: Referring Ebert Forrester: Fulton Reek Treating Aedon Deason/Extender: Beather Arbour Weeks in Treatment: 8 Wound Status Wound Number: 9 Primary Pressure Ulcer Etiology: Wound Location: Right Gluteus Wound Open Wounding Event: Pressure Injury Status: Date Acquired: 04/21/2018 Comorbid Anemia, Arrhythmia, Hypertension, Type II Weeks Of Treatment: 0 History: Diabetes, End Stage Renal Disease, Received Clustered Wound: No Radiation Wound Measurements Length: (cm) 3 Width:  (cm) 2 Depth: (cm) 0.1 Area: (cm) 4.712 Volume: (cm) 0.471 % Reduction in Area: 0% % Reduction in Volume: 0% Epithelialization: None Tunneling: No Undermining: No Wound Description Classification: Category/Stage III Wound Margin: Thickened Exudate Amount: Large Exudate Type: Serosanguineous Exudate Color: red, brown Foul Odor After Cleansing: No Slough/Fibrino Yes Wound Bed Granulation Amount: None Present (0%) Exposed Structure Necrotic Amount: Large (67-100%) Fascia Exposed: No Necrotic Quality: Eschar Fat Layer (Subcutaneous Tissue) Exposed: Yes Tendon Exposed: No Muscle Exposed: No Joint Exposed: No Bone Exposed: No Periwound Skin Texture Texture Color No Abnormalities Noted: No No Abnormalities Noted: No Hemosiderin Staining: Yes Moisture No Abnormalities  Noted: No Wound Preparation Ulcer Cleansing: Rinsed/Irrigated with Saline Treatment Notes Wound #9 (Right Gluteus) Notes SAMNANG, SHUGARS. ([id number]) BFD all wounds, heels cups to calcaneous bilaterally Electronic Signature(s) Signed: 04/22/2018 4:38:01 PM By: Harold Barban Entered By: Harold Barban on 04/21/2018 15:02:23 Gregory Tyler ([id number]) -------------------------------------------------------------------------------- Vitals Details Patient Name: Gregory Tyler. Date of Service: 04/21/2018 2:00 PM Medical Record Number: [id number] Patient Account Number: [id number] Date of Birth/Sex: 04-Oct-1940 (78 y.o. M) Treating RN: Harold Barban Primary Care Joli Koob: Fulton Reek Other Clinician: Referring Burney Calzadilla: Fulton Reek Treating Tamaira Ciriello/Extender: Beather Arbour Weeks in Treatment: 8 Vital Signs Time Taken: 14:40 Temperature (F): 98.4 Height (in): 68 Pulse (bpm): 82 Weight (lbs): 168 Respiratory Rate (breaths/min): 16 Body Mass Index (BMI): 25.5 Blood Pressure (mmHg): 90/48 Reference Range: 80 - 120 mg / dl Electronic Signature(s) Signed: 04/22/2018 4:38:01  PM By: Harold Barban Entered By: Harold Barban on 04/21/2018 14:38:32

## 2018-04-23 NOTE — Progress Notes (Signed)
NASIIR, MONTS ([id number]) Visit Report for 04/21/2018 Chief Complaint Document Details Patient Name: Gregory Tyler, Gregory Tyler. Date of Service: 04/21/2018 2:00 PM Medical Record Number: [id number] Patient Account Number: [id number] Date of Birth/Sex: October 21, 1940 (77 y.o. M) Treating RN: Harold Barban Primary Care Provider: Fulton Reek Other Clinician: Referring Provider: Fulton Reek Treating Provider/Extender: Oneida Arenas in Treatment: 8 Information Obtained from: Patient Chief Complaint Multiple pressure ulcers of the bilateral LEs and gluteal/sacral region Electronic Signature(s) Signed: 04/21/2018 10:32:40 PM By: Beather Arbour FNP-C Entered By: Beather Arbour on 04/21/2018 15:11:39 Gregory Tyler ([id number]) -------------------------------------------------------------------------------- HPI Details Patient Name: Gregory Tyler Date of Service: 04/21/2018 2:00 PM Medical Record Number: [id number] Patient Account Number: [id number] Date of Birth/Sex: 03-12-1941 (77 y.o. M) Treating RN: Harold Barban Primary Care Provider: Fulton Reek Other Clinician: Referring Provider: Fulton Reek Treating Provider/Extender: Oneida Arenas in Treatment: 8 History of Present Illness HPI Description: 02/18/18 patient presents today for initial evaluation concerning multiple ulcers that he has over the bilateral lower extremities as well as the sacral and left gluteal region. 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 Prior to my valuation today the patient was seen by Dr. Vickki Muff in the last office visit note I have is from 01/02/18. At that point sharp debridement was performed of the wound sites. [facility] He has also been a patient of Dr. dew and has had recent angiograms of the bilateral lower extremities. Patient had injured [place] of the right lower extremity which was performed on June 01/23/18. The left lower extremity procedure was performed on 01/30/18. Patient tolerated the procedure as well and according to Dr. Bunnie Domino notes which were reviewed it appears that blood flow was improved to what degree it could be in regard to the bilateral lower extremities. The patient actually did seem to have decent blood flow today. To complement this it also seems that since the procedure the patient's wounds have been improving according to what he tells me today and in fact upon visual inspection his wounds do seem to be better. Currently there are a couple areas there are going to require some debridement but fortunately nothing too significant. No fevers, chills, nausea, or vomiting noted at this time. 03/24/18 on evaluation today patient actually appears to be doing better in some regards regarding some of his wounds. He has been tolerating the dressing changes. It's actually been a little over a month since I have last seen him. He is a resident again at [facility]. 04/07/18 upon evaluation today patient's wounds of actually dramatically worsened compared to my evaluation with him two weeks ago. Specifically his right lateral lower extremity ulcer appears to be significantly affected there does not appear to be any signs of overall worsening in regard to some of his other she'll ulcers although he does have several deep tissue injuries unfortunately. In general I am concerned about the possibility of the patient becoming septic especially considering the fact that he is in it your fibrillation at this point with heart rate running between 110 to 120 and last time he was here the heart rate was around 60. He tells me that he is been  somewhat short of breath he is able to talk but does stop to take a breath on  occasion. He states he's been trying to tell people what was going on with him although he tells me "they haven't paid attention" 04/21/18 Seen today for follow up and management of gluteal and multiple bilateral lower extremity wounds. There has not been any improvement to wounds. Per Mr. Grandville Silos, he states that his facility has been short staff and they have not been able to help him much. Right lower leg is concerning today as it has more than doubled in size and bleeding with surrounding erythema. No palpable pulses of LE extremities and cold to touch. Two new wounds to his pelvis/groin area. He states that he was told that he had a boil. Both pelvis wounds with purulent drainage. Bilateral heel DTI with adherent slough. Gluteal wound with necrotic tissue and slough. He is rating pain 7-8 out of 10. He is not in any acute distress at this time. He will be transferred to hospital via EMS for further evaluation of right leg wound to r/o osteomyelitis v/s sepsis. Electronic Signature(s) Signed: 04/21/2018 10:32:40 PM By: Beather Arbour FNP-C Entered By: Beather Arbour on 04/21/2018 15:38:15 Gregory Tyler ([id number]) -------------------------------------------------------------------------------- Physical Exam Details Patient Name: Gregory Tyler, Gregory Tyler. Date of Service: 04/21/2018 2:00 PM Medical Record Number: [id number] Patient Account Number: [id number] Date of Birth/Sex: [birth year] (77 y.o. M) Treating RN: Harold Barban Primary Care Provider: Fulton Reek Other Clinician: Referring Provider: Fulton Reek Treating Provider/Extender: Beather Arbour Weeks in Treatment: 8 Constitutional Alert and oriented x 3. Chronically ill in no acute distress. Eyes Conjunctivae clear. No discharge. Respiratory Respiratory effort is easy and symmetric bilaterally. Rate is normal at rest and on room  air.. Cardiovascular Pedal pulses absent bilaterally.. Edema present in right LE. Gastrointestinal (GI) Abdomen is soft and non-distended without masses or tenderness.. Musculoskeletal non ambulatory. Integumentary (Hair, Skin) Multiple open wounds. Psychiatric No evidence of depression, anxiety, or agitation. Calm, cooperative, and communicative. Appropriate interactions and affect.. Notes Patient's wound currently on the right lateral leg is worse since last visit. *Increased breakdown of sacral area and LE wounds. Concerning overall appearance of wounds. Due to deterioration of wounds he will be transferred to hospital for further evaluation of wounds. Electronic Signature(s) Signed: 04/21/2018 10:32:40 PM By: Beather Arbour FNP-C Entered By: Beather Arbour on 04/21/2018 15:36:41 Gregory Tyler, Gregory Tyler ([id number]) -------------------------------------------------------------------------------- Physician Orders Details Patient Name: Gregory Tyler, REAS. Date of Service: 04/21/2018 2:00 PM Medical Record Number: [id number] Patient Account Number: [id number] Date of Birth/Sex: 1940/11/23 (77 y.o. M) Treating RN: Harold Barban Primary Care Provider: Fulton Reek Other Clinician: Referring Provider: Fulton Reek Treating Provider/Extender: Oneida Arenas in Treatment: 8 Verbal / Phone Orders: No Diagnosis Coding ICD-10 Coding Code Description [phone] Pressure ulcer of left heel, stage 2 L89.610 Pressure ulcer of right heel, unstageable E11.621 Type 2 diabetes mellitus with foot ulcer L97.512 Non-pressure chronic ulcer of other part of right foot with fat layer exposed L89.323 Pressure ulcer of left buttock, stage 3 L89.153 Pressure ulcer of sacral region, stage 3 I73.89 Other specified peripheral vascular diseases Electronic Signature(s) Signed: 04/21/2018 10:32:40 PM By: Beather Arbour FNP-C Entered By: Beather Arbour on 04/21/2018 15:34:28 M[place], Gregory Tyler  ([id number]) -------------------------------------------------------------------------------- Problem List Details Patient Name: Gregory Tyler. Date of Service: 04/21/2018 2:00 PM Medical Record Number: [id number] Patient Account Number: [id number] Date of Birth/Sex: March 08, 1941 (77 y.o. M) Treating RN: Harold Barban Primary Care Provider: Fulton Reek Other Clinician: Referring Provider: Fulton Reek Treating Provider/Extender: Oneida Arenas in Treatment: 8 Active Problems ICD-10 Evaluated Encounter Code Description Active Date Today  Diagnosis L89.622 Pressure ulcer of left heel, stage 2 02/19/2018 No Yes L89.610 Pressure ulcer of right heel, unstageable 02/19/2018 No Yes E11.621 Type 2 diabetes mellitus with foot ulcer 02/19/2018 No Yes L97.512 Non-pressure chronic ulcer of other part of right foot with fat 02/19/2018 No Yes layer exposed L89.323 Pressure ulcer of left buttock, stage 3 02/19/2018 No Yes L89.153 Pressure ulcer of sacral region, stage 3 02/19/2018 No Yes I73.89 Other specified peripheral vascular diseases 02/19/2018 No Yes Inactive Problems Resolved Problems Electronic Signature(s) Signed: 04/21/2018 10:32:40 PM By: Beather Arbour FNP-C Entered By: Beather Arbour on 04/21/2018 15:11:09 Gregory Tyler ([id number]) -------------------------------------------------------------------------------- Progress Note Details Patient Name: Gregory Tyler. Date of Service: 04/21/2018 2:00 PM Medical Record Number: [id number] Patient Account Number: [id number] Date of Birth/Sex: [birth year] (77 y.o. M) Treating RN: Harold Barban Primary Care Provider: Fulton Reek Other Clinician: Referring Provider: Fulton Reek Treating Provider/Extender: Oneida Arenas in Treatment: 8 Subjective Chief Complaint Information obtained from Patient Multiple pressure ulcers of the bilateral LEs and gluteal/sacral region History of Present Illness  (HPI) 02/18/18 patient presents today for initial evaluation concerning multiple ulcers that he has over the bilateral lower extremities as well as the sacral and left gluteal region. 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 Prior to my valuation today the patient was seen by Dr. Vickki Muff in the last office visit note I have is from 01/02/18. At that point sharp debridement was performed of the wound sites. [facility] He has also been a patient of Dr. dew and has had recent angiograms of the bilateral lower extremities. Patient had injured [place] of the right lower extremity which was performed on June 01/23/18. The left lower extremity procedure was performed on 01/30/18. Patient tolerated the procedure as well and according to Dr. Bunnie Domino notes which were reviewed it appears that blood flow was improved to what degree it could be in regard to the bilateral lower extremities. The patient actually did seem to have decent blood flow today. To complement this it also seems that since the procedure the patient's wounds have been improving according to what he tells me today and in fact upon visual inspection his wounds do seem to be better. Currently there are a couple areas there are going to require some debridement but fortunately nothing too significant. No fevers, chills, nausea, or vomiting noted at this time. 03/24/18 on evaluation today patient actually appears to be doing better in some regards regarding some of his wounds. He has been tolerating the dressing changes. It's actually been a little over a month since I have last seen him. He is a resident again at [facility]. 04/07/18  upon evaluation today patient's wounds of actually dramatically worsened compared to my evaluation with him two weeks ago. Specifically his right lateral lower extremity ulcer appears to be significantly affected there does not appear to be any signs of overall worsening in regard to some of his other she'll ulcers although he does have several deep tissue injuries unfortunately. In general I am concerned about the possibility of the patient becoming septic especially considering the fact that he is in it your fibrillation at this point with heart rate running between 110 to 120 and last time he was here the heart rate was around  60. He tells me that he is been somewhat short of breath he is able to talk but does stop to take a breath on occasion. He states he's been trying to tell people what was going on with him although he tells me "they haven't paid attention" 04/21/18 Seen today for follow up and management of gluteal and multiple bilateral lower extremity wounds. There has not been any improvement to wounds. Per Mr. Grandville Silos, he states that his facility has been short staff and they have not been able to help him much. Right lower leg is concerning today as it has more than doubled in size and bleeding with surrounding erythema. No palpable pulses of LE extremities and cold to touch. Two new wounds to his pelvis/groin area. He states that he was told that he had a boil. Both pelvis wounds with purulent drainage. Bilateral heel DTI with adherent slough. Gluteal wound with necrotic tissue and slough. He is rating pain 7-8 out of 10. He is not in any acute distress at this time. He will be transferred to hospital for further evaluation of right leg wound to r/o osteomyelitis v/s sepsis. Patient History Information obtained from Patient. Family History Heart Disease - Mother, Hypertension, COLLIE, WERNICK ([id number]) No family history of Cancer, Diabetes, Kidney Disease, Lung Disease,  Seizures, Stroke, Thyroid Problems, Tuberculosis. Social History Former smoker, Marital Status - Married, Alcohol Use - Never, Drug Use - No History, Caffeine Use - Daily. Review of Systems (ROS) Constitutional Symptoms (General Health) The patient has no complaints or symptoms. Ear/Nose/Mouth/Throat The patient has no complaints or symptoms. Respiratory The patient has no complaints or symptoms. Cardiovascular The patient has no complaints or symptoms. Gastrointestinal The patient has no complaints or symptoms. Integumentary (Skin) Complains or has symptoms of Wounds - multiple wounds to gluteal and Bilateral LE, Swelling - right LE. Objective Constitutional Alert and oriented x 3. Chronically ill in no acute distress. Vitals Time Taken: 2:40 PM, Height: 68 in, Weight: 168 lbs, BMI: 25.5, Temperature: 98.4 F, Pulse: 82 bpm, Respiratory Rate: 16 breaths/min, Blood Pressure: 90/48 mmHg. Respiratory Respiratory effort is easy and symmetric bilaterally. Rate is normal at rest and on room air.. Cardiovascular Pedal pulses absent bilaterally.. Edema present in right LE. Gastrointestinal (GI) Abdomen is soft and non-distended without masses or tenderness.. General Notes: Patient's wound currently on the right lateral leg is worse since last visit. *Increased breakdown of sacral area and LE wounds. Concerning overall appearance of wounds. Due to deterioration of wounds he will be transferred to hospital for further evaluation of wounds. Integumentary (Hair, Skin) Multiple open wounds. Wound #1 status is Open. Original cause of wound was Pressure Injury. The wound is located on the Left Calcaneus. The wound measures 2cm length x 2cm width x 0.1cm depth; 3.142cm^2 area and 0.314cm^3 volume. There is Fat Layer (Subcutaneous Tissue) Exposed exposed. There is no tunneling or undermining noted. There is a medium amount of serosanguineous drainage noted. The wound margin is flat and intact.  There is no granulation within the wound bed. There is a medium (34-66%) amount of necrotic tissue within the wound bed including Adherent Slough. The periwound skin appearance exhibited: Callus, Crepitus, Excoriation, Induration, Rash, Scarring, Dry/Scaly, Maceration, Atrophie Gregory Tyler, Gregory R. ([id number]) Cyanosis, Ecchymosis, Hemosiderin Staining, Mottled, Pallor, Rubor, Erythema. The surrounding wound skin color is noted with erythema. Periwound temperature was noted as No Abnormality. The periwound has tenderness on palpation. Wound #5 status is Open. Original cause of wound was Pressure Injury. The wound  is located on the Left Gluteus. The wound measures 1cm length x 3cm width x 0.1cm depth; 2.356cm^2 area and 0.236cm^3 volume. There is Fat Layer (Subcutaneous Tissue) Exposed exposed. There is no tunneling or undermining noted. There is a medium amount of serosanguineous drainage noted. The wound margin is flat and intact. There is small (1-33%) red granulation within the wound bed. There is a medium (34-66%) amount of necrotic tissue within the wound bed including Adherent Slough. The periwound skin appearance exhibited: Scarring, Hemosiderin Staining. The periwound skin appearance did not exhibit: Callus, Crepitus, Excoriation, Induration, Rash, Dry/Scaly, Maceration, Atrophie Blanche, Cyanosis, Ecchymosis, Mottled, Pallor, Rubor, Erythema. Wound #6 status is Open. Original cause of wound was Gradually Appeared. The wound is located on the Medial Sacrum. The wound measures 4cm length x 2.5cm width x 0.5cm depth; 7.854cm^2 area and 3.927cm^3 volume. There is Fat Layer (Subcutaneous Tissue) Exposed exposed. There is no tunneling or undermining noted. There is a large amount of serosanguineous drainage noted. The wound margin is flat and intact. There is small (1-33%) pink granulation within the wound bed. There is a large (67-100%) amount of necrotic tissue within the wound bed  including Eschar and Adherent Slough. The periwound skin appearance exhibited: Scarring. The periwound skin appearance did not exhibit: Callus, Crepitus, Excoriation, Induration, Rash, Dry/Scaly, Maceration, Atrophie Blanche, Cyanosis, Ecchymosis, Hemosiderin Staining, Mottled, Pallor, Rubor, Erythema. Wound #8 status is Open. Original cause of wound was Not Known. The wound is located on the Right,Lateral Lower Leg. The wound measures 8cm length x 5.8cm width x 0.6cm depth; 36.442cm^2 area and 21.865cm^3 volume. There is Fat Layer (Subcutaneous Tissue) Exposed exposed. There is no tunneling or undermining noted. There is a small amount of purulent drainage noted. The wound margin is thickened. There is no granulation within the wound bed. There is a large (67- 100%) amount of necrotic tissue within the wound bed including Eschar and Adherent Slough. The periwound skin appearance exhibited: Hemosiderin Staining, Erythema. The periwound skin appearance did not exhibit: Callus, Crepitus, Excoriation, Induration, Rash, Scarring, Dry/Scaly, Maceration, Atrophie Blanche, Cyanosis, Ecchymosis, Mottled, Pallor, Rubor. The surrounding wound skin color is noted with erythema which is circumferential. Wound #9 status is Open. Original cause of wound was Pressure Injury. The wound is located on the Right Gluteus. The wound measures 3cm length x 2cm width x 0.1cm depth; 4.712cm^2 area and 0.471cm^3 volume. There is Fat Layer (Subcutaneous Tissue) Exposed exposed. There is no tunneling or undermining noted. There is a large amount of serosanguineous drainage noted. The wound margin is thickened. There is no granulation within the wound bed. There is a large (67-100%) amount of necrotic tissue within the wound bed including Eschar. The periwound skin appearance exhibited: Hemosiderin Staining. 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 Date of Service: 04/21/2018 2:00 PM Medical Record Number: [id number] Patient Account Number: [id number] Date of Birth/Sex: [birth year] (77 y.o. M) Treating RN: Harold Barban Primary Care Provider: Fulton Reek Other Clinician: Referring Provider: Fulton Reek Treating Provider/Extender: Beather Arbour Weeks in Treatment: 8 Information  Obtained From Patient Wound History Do you currently have one or more open woundso Yes How many open wounds do you currently haveo 6 Approximately how long have you had your woundso 1year How have you been treating your wound(s) until nowo betadine Has your wound(s) ever healed and then re-openedo No Have you had any lab work done in the past montho No Have you tested positive for an antibiotic resistant organism (MRSA, VRE)o No Have you tested positive for osteomyelitis (bone infection)o No Have you had any tests for circulation on your legso Yes Who ordered the [place] (Skin) Complaints and Symptoms: Positive for: Wounds - multiple wounds to gluteal and Bilateral LE; Swelling - right LE Constitutional Symptoms (General Health) Complaints and Symptoms: No Complaints or Symptoms Eyes Complaints and Symptoms: No Complaints or Symptoms Medical History: Negative for: Cataracts Ear/Nose/Mouth/Throat Complaints and Symptoms: No Complaints or  Symptoms Medical History: Negative for: Chronic sinus problems/congestion Hematologic/Lymphatic Medical History: Positive for: Anemia Negative for: Hemophilia; Human Immunodeficiency Virus; Lymphedema; Sickle Cell Disease Respiratory Gregory Tyler, NOLT. ([id number]) Complaints and Symptoms: No Complaints or Symptoms Cardiovascular Complaints and Symptoms: No Complaints or Symptoms Medical History: Positive for: Arrhythmia; Hypertension Negative for: Angina; Congestive Heart Failure; Coronary Artery Disease; Deep Vein Thrombosis; Hypotension; Myocardial Infarction; Peripheral Arterial Disease; Peripheral Venous Disease; Phlebitis; Vasculitis Gastrointestinal Complaints and Symptoms: No Complaints or Symptoms Medical History: Negative for: Cirrhosis ; Colitis; Crohnos; Hepatitis A; Hepatitis B; Hepatitis C Endocrine Medical History: Positive for: Type II Diabetes Negative for: Type I Diabetes Treated with: Oral agents Genitourinary Medical History: Positive for: End Stage Renal Disease Immunological Medical History: Negative for: Lupus Erythematosus; Raynaudos; Scleroderma Musculoskeletal Complaints and Symptoms: No Complaints or Symptoms Oncologic Medical History: Positive for: Received Radiation Negative for: Received Chemotherapy Psychiatric Complaints and Symptoms: No Complaints or Symptoms Immunizations Pneumococcal Vaccine: Received Pneumococcal Vaccination: Yes Gregory Tyler, Gregory Tyler ([id number]) Implantable Devices Family and Social History Cancer: No; Diabetes: No; Heart Disease: Yes - Mother; Hypertension: Yes; Kidney Disease: No; Lung Disease: No; Seizures: No; Stroke: No; Thyroid Problems: No; Tuberculosis: No; Former smoker; Marital Status - Married; Alcohol Use: Never; Drug Use: No History; Caffeine Use: Daily; Advanced Directives: Yes (Not Provided); Patient does not want information on Advanced Directives; Living Will: Yes (Not Provided); Medical Power of  Attorney: Yes (Not Provided) Physician Affirmation I have reviewed and agree with the above information. Electronic Signature(s) Signed: 04/21/2018 10:32:40 PM By: Beather Arbour FNP-C Signed: 04/22/2018 4:38:01 PM By: Harold Barban Entered By: Beather Arbour on 04/21/2018 15:37:22 Gregory Tyler, Gregory Tyler ([id number]) -------------------------------------------------------------------------------- SuperBill Details Patient Name: Gregory Tyler, Gregory Tyler. Date of Service: 04/21/2018 Medical Record Number: [id number] Patient Account Number: [id number] Date of Birth/Sex: 1940/11/29 (78 y.o. M) Treating RN: Harold Barban Primary Care Provider: Fulton Reek Other Clinician: Referring Provider: Fulton Reek Treating Provider/Extender: Beather Arbour Weeks in Treatment: 8 Diagnosis Coding ICD-10 Codes Code Description [phone] Pressure ulcer of left heel, stage 2 L89.610 Pressure ulcer of right heel, unstageable E11.621 Type 2 diabetes mellitus with foot ulcer L97.512 Non-pressure chronic ulcer of other part of right foot with fat layer exposed L89.323 Pressure ulcer of left buttock, stage 3 L89.153 Pressure ulcer of sacral region, stage 3 I73.89 Other specified peripheral vascular diseases Facility Procedures CPT4 Code: 56389373 Description: 42876 - WOUND CARE VISIT-LEV 5 EST PT Modifier: Quantity: 1 Physician Procedures CPT4 Code: 8115726 Description: 99213 - WC PHYS LEVEL 3 - EST PT ICD-10 Diagnosis Description L97.512 Non-pressure chronic ulcer of other part of right foot with fat L89.610 Pressure ulcer of  right heel, unstageable L89.622 Pressure ulcer of left heel, stage 2 L89.153  Pressure ulcer of sacral region, stage 3 Modifier: layer exposed Quantity: 1 Electronic Signature(s) Unsigned Previous Signature: 04/21/2018 10:32:40 PM Version By: Beather Arbour FNP-C Entered By: Sharon Mt on 04/22/2018 13:52:43 Signature(s): Date(s):

## 2018-04-23 NOTE — Progress Notes (Addendum)
Per [facility] admissions coordinator at [facility] patient is a long term care SNF resident at [facility] and can return to [facility] when medically stable. Magda Paganini is aware that patient will need a wound vac and reported that she will order it today.   [employer], LCSW [phone]

## 2018-04-23 NOTE — Progress Notes (Signed)
[place] at [place] NAME: Gregory Tyler    MR#:  [id number]  DATE OF BIRTH:  1940-09-06  SUBJECTIVE:   Status post surgical debridement of the right calf ulcer.  Postop day #1 today.  Patient has a wound VAC now.  Still complaining of pain in that right leg.  No other acute events overnight.  REVIEW OF SYSTEMS:    Review of Systems  Constitutional: Negative for chills and fever.  HENT: Negative for congestion and tinnitus.   Eyes: Negative for blurred vision and double vision.  Respiratory: Negative for cough, shortness of breath and wheezing.   Cardiovascular: Negative for chest pain, orthopnea and PND.  Gastrointestinal: Negative for abdominal pain, diarrhea, nausea and vomiting.  Genitourinary: Negative for dysuria and hematuria.  Neurological: Negative for dizziness, sensory change and focal weakness.  All other systems reviewed and are negative.   Nutrition: Carb control Tolerating Diet: yes Tolerating PT: Bedbound at baseline.  DRUG ALLERGIES:   Allergies  Allergen Reactions  . Aspirin Other (See Comments)    GI Bleeding  . Tuberculin Tests Other (See Comments)    unknown  . Codeine Other (See Comments)    BLISTERS     VITALS:  Blood pressure 103/67, pulse (!) 103, temperature 98.7 F (37.1 C), temperature source Oral, resp. rate 18, height 5\' 8"  (1.727 m), weight 72.6 kg, SpO2 96 %.  PHYSICAL EXAMINATION:   Physical Exam  GENERAL:  78 y.o.-year-old patient lying in bed in no acute distress.  EYES: Pupils equal, round, reactive to light and accommodation. No scleral icterus. Extraocular muscles intact.  HEENT: Head atraumatic, normocephalic. Oropharynx and nasopharynx clear.  NECK:  Supple, no jugular venous distention. No thyroid enlargement, no tenderness.  LUNGS: Normal breath sounds bilaterally, no wheezing, rales, rhonchi. No use of accessory muscles of respiration.  CARDIOVASCULAR: S1, S2 normal. No  murmurs, rubs, or gallops.  ABDOMEN: Soft, nontender, nondistended. Bowel sounds present. No organomegaly or mass.  EXTREMITIES: No cyanosis, clubbing or edema b/l.    NEUROLOGIC: Cranial nerves II through XII are intact. No focal Motor or sensory deficits b/l.  Globally weak PSYCHIATRIC: The patient is alert and oriented x 3.  SKIN: No obvious rash, lesion, right calf ulcer with wound vac in place, bilateral heel pressure ulcers which are stage I-II.   LABORATORY PANEL:   CBC Recent Labs  Lab 04/23/18 0402  WBC 10.8*  HGB 9.3*  HCT 30.2*  PLT 280   ------------------------------------------------------------------------------------------------------------------  Chemistries  Recent Labs  Lab 04/21/18 1619 04/23/18 0402  NA 134* 135  K 4.0 3.9  CL 101 106  CO2 23 22  GLUCOSE 227* 178*  BUN 41* 36*  CREATININE 1.49* 1.51*  CALCIUM 8.3* 8.0*  AST 94*  --   ALT 72*  --   ALKPHOS 118  --   BILITOT 0.6  --    ------------------------------------------------------------------------------------------------------------------  Cardiac Enzymes No results for input(s): TROPONINI in the last 168 hours. ------------------------------------------------------------------------------------------------------------------  RADIOLOGY:  No results found.   ASSESSMENT AND PLAN:   78 year old male with past medical history of COPD, diabetes, hypertension, hyperlipidemia, paroxysmal atrial fibrillation, history of prostate cancer, combined diastolic systolic CHF, gout who presents to the hospital due to pain in his lower extremities and noted to have infected right calf ulcer.  1.  Infected right calf ulcer- secondary to peripheral vascular disease.   -Seen by vascular surgery and status post surgical debridement postop day #1 today.  Status post wound VAC placement.  Wound cultures currently negative. -Continue broad-spectrum antibiotics with IV vancomycin, cefepime for now. -Patient  is clinically afebrile and hemodynamically stable.  2.  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 - BS stable.   3.  History of paroxysmal atrial fibrillation- rate controlled.  Continue Cardizem, digoxin, metoprolol. - will resume Eliquis today.   4.  COPD-no acute exacerbation-continue Spiriva, duo nebs as needed  5.  Diabetic neuropathy-continue gabapentin.  6.  History of gout-no acute attack.  Continue allopurinol.  All the records are reviewed and case discussed with Care Management/Social Worker. Management plans discussed with the patient, family and they are in agreement.  CODE STATUS: Full code  DVT Prophylaxis: Eliquis  TOTAL TIME TAKING CARE OF THIS PATIENT: 30 minutes.   POSSIBLE D/C IN 2-3 DAYS, DEPENDING ON CLINICAL CONDITION.   Henreitta Leber M.D on 04/23/2018 at 3:36 PM  Between 7am to 6pm - Pager - [phone]  After 6pm go to [email or web address] - Proofreader  [facility]  Office  [phone]  CC: Primary care physician; [name], MD

## 2018-04-24 ENCOUNTER — Ambulatory Visit: Payer: Medicare Other | Admitting: Physician Assistant

## 2018-04-24 ENCOUNTER — Ambulatory Visit (INDEPENDENT_AMBULATORY_CARE_PROVIDER_SITE_OTHER): Payer: Medicare Other | Admitting: Vascular Surgery

## 2018-04-24 LAB — URINALYSIS, ROUTINE W REFLEX MICROSCOPIC
Bacteria, UA: NONE SEEN
Bilirubin Urine: NEGATIVE
Glucose, UA: >=500 mg/dL — AB
Ketones, ur: NEGATIVE mg/dL
Nitrite: NEGATIVE
Protein, ur: 100 mg/dL — AB
Specific Gravity, Urine: 1.013 (ref 1.005–1.030)
WBC, UA: >50 WBC/hpf — ABNORMAL HIGH (ref 0–5)
pH: 5 (ref 5.0–8.0)

## 2018-04-24 LAB — GLUCOSE, CAPILLARY
Glucose-Capillary: 147 mg/dL — ABNORMAL HIGH (ref 70–99)
Glucose-Capillary: 167 mg/dL — ABNORMAL HIGH (ref 70–99)
Glucose-Capillary: 249 mg/dL — ABNORMAL HIGH (ref 70–99)
Glucose-Capillary: 283 mg/dL — ABNORMAL HIGH (ref 70–99)

## 2018-04-24 NOTE — Care Management Important Message (Signed)
Copy of signed Medicare IM left with patient in room. 

## 2018-04-24 NOTE — Consult Note (Signed)
Pharmacy Antibiotic Note  Gregory Tyler is a 78 y.o. male admitted on 04/21/2018 with wound infection.  Pharmacy has been consulted for Vancomycin/Cefepime dosing.  Plan: Cefepime 1g q 12hours (renally adjusted from 2g q12h)  Vancomycin 1g IV every 24 hours.  Goal trough 10-15 mcg/mL.  Pt received 1g dose @ ~ 1800 12/30 - Stacked dosing @ 12 hours  Ke=0.037  t 1/2 = 18.73  Vd = 50.82  VT will be checked prior to the 4th scheduled dose @ 0530 on 04/25/18   Height: 5\' 8"  (172.7 cm) Weight: 160 lb (72.6 kg) IBW/kg (Calculated) : 68.4  Temp (24hrs), Avg:98.2 F (36.8 C), Min:97.9 F (36.6 C), Max:98.7 F (37.1 C)  Recent Labs  Lab 04/21/18 1606 04/21/18 1619 04/23/18 0402  WBC  --  13.0* 10.8*  CREATININE  --  1.49* 1.51*  LATICACIDVEN 0.97  --   --     Estimated Creatinine Clearance: 39 mL/min (A) (by C-G formula based on SCr of 1.51 mg/dL (H)).    Allergies  Allergen Reactions  . Aspirin Other (See Comments)    GI Bleeding  . Tuberculin Tests Other (See Comments)    unknown  . Codeine Other (See Comments)    BLISTERS     Antimicrobials this admission: Vancomycin 12/30 >>  Cefepime 12/30 >>   Dose adjustments this admission: N/A  Microbiology results: 12/30 BCx: pending 12/31 wound Cx: + moderate proteus mirabilis. And few gram variable rod.   Thank you for allowing pharmacy to be a part of this patient's care.  Oswald Hillock, PharmD, BCPS Clinical Pharmacist 04/24/2018 9:59 AM

## 2018-04-24 NOTE — Consult Note (Signed)
[facility] Nurse wound consult note Reason for Consult: Consult requested for new onset Unstageable pressure injury, wound is a Stage 3 pressure injury Wound type:Pressure Pressure Injury POA: Yes Measurement:2.5cm x 3cm x 0.2cm Wound DPO:EUMP, with small amount of yellow fibrinous slough in wound bed. Small amount of drainage on old dressing. Drainage (amount, consistency, odor) small amount light yellow consistent with autolytically debriding slough. No odor Periwound: intact, mild erythema. Dressing procedure/placement/frequency: Patient is on a mattress replacement with low air loss feature, however, he is wearing an incontinence brief and has two underpads beneath him (a reusable and a disposable).  He is in the supine positon with HOB elevated above 30 degrees, his preference.  I teach that he will have the most efficacy from the low air loss therapy and an enhanced healing to his sacral pressure injury if he would lie side to side except for meals and if he would keep the [facility] at or below a 30 degree angle (again, except for meals).  He indicates that he is aware of this but that he has a back back and that he would "think about it".  Nursing Tech is provided guidance regarding underpads and positioning.  [place] nursing team will not follow, but will remain available to this patient, the nursing and medical teams.  Please re-consult if needed. Thanks, Maudie Flakes, MSN, RN, [place], Arther Abbott  Pager# [phone]

## 2018-04-24 NOTE — Progress Notes (Signed)
[place] at [place] NAME: Gregory Tyler    MR#:  [id number]  DATE OF BIRTH:  04/11/1941  SUBJECTIVE:   Status post surgical debridement of the right calf ulcer.  Postop day #2 today.  Patient has a wound VAC now.  Still complaining of pain near surgical site. Afebrile.  REVIEW OF SYSTEMS:    Review of Systems  Constitutional: Negative for chills and fever.  HENT: Negative for congestion and tinnitus.   Eyes: Negative for blurred vision and double vision.  Respiratory: Negative for cough, shortness of breath and wheezing.   Cardiovascular: Negative for chest pain, orthopnea and PND.  Gastrointestinal: Negative for abdominal pain, diarrhea, nausea and vomiting.  Genitourinary: Negative for dysuria and hematuria.  Neurological: Negative for dizziness, sensory change and focal weakness.  All other systems reviewed and are negative.   Nutrition: Carb control Tolerating Diet: yes Tolerating PT: Bedbound at baseline.  DRUG ALLERGIES:   Allergies  Allergen Reactions  . Aspirin Other (See Comments)    GI Bleeding  . Tuberculin Tests Other (See Comments)    unknown  . Codeine Other (See Comments)    BLISTERS     VITALS:  Blood pressure 110/81, pulse 95, temperature 97.9 F (36.6 C), temperature source Oral, resp. rate 18, height 5\' 8"  (1.727 m), weight 72.6 kg, SpO2 100 %.  PHYSICAL EXAMINATION:   Physical Exam  GENERAL:  78 y.o.-year-old patient lying in bed in no acute distress.  EYES: Pupils equal, round, reactive to light and accommodation. No scleral icterus. Extraocular muscles intact.  HEENT: Head atraumatic, normocephalic. Oropharynx and nasopharynx clear.  NECK:  Supple, no jugular venous distention. No thyroid enlargement, no tenderness.  LUNGS: Normal breath sounds bilaterally, no wheezing, rales, rhonchi. No use of accessory muscles of respiration.  CARDIOVASCULAR: S1, S2 normal. No murmurs, rubs, or gallops.    ABDOMEN: Soft, nontender, nondistended. Bowel sounds present. No organomegaly or mass.  EXTREMITIES: No cyanosis, clubbing or edema b/l.    NEUROLOGIC: Cranial nerves II through XII are intact. No focal Motor or sensory deficits b/l.  Globally weak PSYCHIATRIC: The patient is alert and oriented x 3.  SKIN: No obvious rash, lesion, right calf ulcer with wound vac in place, bilateral heel pressure ulcers which are stage I-II.   LABORATORY PANEL:   CBC Recent Labs  Lab 04/23/18 0402  WBC 10.8*  HGB 9.3*  HCT 30.2*  PLT 280   ------------------------------------------------------------------------------------------------------------------  Chemistries  Recent Labs  Lab 04/21/18 1619 04/23/18 0402  NA 134* 135  K 4.0 3.9  CL 101 106  CO2 23 22  GLUCOSE 227* 178*  BUN 41* 36*  CREATININE 1.49* 1.51*  CALCIUM 8.3* 8.0*  AST 94*  --   ALT 72*  --   ALKPHOS 118  --   BILITOT 0.6  --    ------------------------------------------------------------------------------------------------------------------  Cardiac Enzymes No results for input(s): TROPONINI in the last 168 hours. ------------------------------------------------------------------------------------------------------------------  RADIOLOGY:  No results found.   ASSESSMENT AND PLAN:   78 year old male with past medical history of COPD, diabetes, hypertension, hyperlipidemia, paroxysmal atrial fibrillation, history of prostate cancer, combined diastolic systolic CHF, gout who presents to the hospital due to pain in his lower extremities and noted to have infected right calf ulcer.  1.  Infected right calf ulcer- secondary to peripheral vascular disease.   -Seen by vascular surgery and status post surgical debridement postop day #2 today.  Status post wound VAC placement.  Wound cultures growing proteus but sensitivities pending.  - cont. Cefepime but will d/c Vanc. For now.  -Discussed with vascular surgery and  they plan on changing the wound VAC tomorrow and once sensitivities we can switch to oral antibiotics and discharge may be tomorrow.  2.  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 - BS stable.   3.  History of paroxysmal atrial fibrillation- rate controlled.  Continue Cardizem, digoxin, metoprolol. - cont. Eliquis  4.  COPD-no acute exacerbation-continue Spiriva, duo nebs as needed  5.  Diabetic neuropathy-continue gabapentin.  6.  History of gout-no acute attack.  Continue allopurinol.  All the records are reviewed and case discussed with Care Management/Social Worker. Management plans discussed with the patient, family and they are in agreement.  CODE STATUS: Full code  DVT Prophylaxis: Eliquis  TOTAL TIME TAKING CARE OF THIS PATIENT: 30 minutes.   POSSIBLE D/C IN 1-2 DAYS, DEPENDING ON CLINICAL CONDITION.   Henreitta Leber M.D on 04/24/2018 at 2:18 PM  Between 7am to 6pm - Pager - [phone]  After 6pm go to [email or web address] - Proofreader  [facility]  Office  [phone]  CC: Primary care physician; [name], MD

## 2018-04-25 ENCOUNTER — Encounter
Admission: EM | Disposition: A | Discharge status: Skilled Nursing Facility | Payer: Self-pay | Source: Ambulatory Visit | Attending: Specialist

## 2018-04-25 DIAGNOSIS — L02415 Cutaneous abscess of right lower limb: Secondary | ICD-10-CM

## 2018-04-25 DIAGNOSIS — Z48817 Encounter for surgical aftercare following surgery on the skin and subcutaneous tissue: Secondary | ICD-10-CM

## 2018-04-25 HISTORY — PX: FLUOROSCOPY GUIDANCE: CATH118240

## 2018-04-25 LAB — GLUCOSE, CAPILLARY
GLUCOSE-CAPILLARY: 182 mg/dL — AB (ref 70–99)
GLUCOSE-CAPILLARY: 314 mg/dL — AB (ref 70–99)
Glucose-Capillary: 153 mg/dL — ABNORMAL HIGH (ref 70–99)
Glucose-Capillary: 122 mg/dL — ABNORMAL HIGH (ref 70–99)
Glucose-Capillary: 128 mg/dL — ABNORMAL HIGH (ref 70–99)

## 2018-04-25 LAB — SURGICAL PATHOLOGY

## 2018-04-25 SURGERY — FLUOROSCOPY GUIDANCE
Anesthesia: Moderate Sedation | Laterality: Right

## 2018-04-25 MED ORDER — MIDAZOLAM HCL 5 MG/5ML IJ SOLN
INTRAMUSCULAR | Status: AC
  Filled 2018-04-25: qty 5

## 2018-04-25 MED ORDER — FENTANYL CITRATE (PF) 100 MCG/2ML IJ SOLN
INTRAMUSCULAR | Status: AC
  Filled 2018-04-25: qty 2

## 2018-04-25 MED ORDER — MIDAZOLAM HCL 2 MG/2ML IJ SOLN
INTRAMUSCULAR | Status: DC | PRN
  Administered 2018-04-25: 2 mg via INTRAVENOUS

## 2018-04-25 MED ORDER — FENTANYL CITRATE (PF) 100 MCG/2ML IJ SOLN
INTRAMUSCULAR | Status: DC | PRN
  Administered 2018-04-25: 25 ug via INTRAVENOUS
  Administered 2018-04-25: 50 ug via INTRAVENOUS
  Administered 2018-04-25: 25 ug via INTRAVENOUS

## 2018-04-25 SURGICAL SUPPLY — 3 items
DRSG VAC ATS MED SENSATRAC (GAUZE/BANDAGES/DRESSINGS) ×3 IMPLANT
TRAY LACERAT/PLASTIC (MISCELLANEOUS) ×3 IMPLANT
WND VAC CANISTER 500ML (MISCELLANEOUS) ×3 IMPLANT

## 2018-04-25 NOTE — Op Note (Signed)
    OPERATIVE NOTE   PROCEDURE: Wound VAC dressing change right leg with conscious sedation  PRE-OPERATIVE DIAGNOSIS: Abscess right leg status post incision drainage  POST-OPERATIVE DIAGNOSIS: Same  SURGEON: Hortencia Pilar  ASSISTANT(S): Mr. Travis [place]  ANESTHESIA: Conscious sedation: Intravenous Versed and fentanyl were administered.  Continuous ECG pulse oximetry and cardiopulmonary monitors performed throughout the entire procedure by the interventional radiology nurse total sedation time is 22 minutes  ESTIMATED BLOOD LOSS: Less than 5 cc  FINDING(S): Granulation now present.  No further purulence is encountered.  SPECIMEN(S): None  INDICATIONS:   Gregory Tyler is a 78 y.o. male who presents with right leg wound VAC status post incision and drainage of an abscess of the posterior calf.  Cultures have returned Proteus.  He is now undergoing his first VAC change.  DESCRIPTION: After full informed written consent was obtained from the patient, the patient was brought back to the operating room and placed supine upon the operating table.  Prior to induction, the patient received IV antibiotics.   After obtaining adequate anesthesia, the patient was then prepped and draped in the standard fashion for a wound VAC change.  Previous dressing is removed and the wound is inspected.  As noted above there is significant granulation tissue throughout the muscle bellies.  There is no further purulent material encountered.  A black wound VAC sponge is trimmed to the appropriate shape and then covered.  Sponge materials then brought anteriorly to avoid the hub acting as a pressure point.  VAC is connected and an excellent seal is noted.   The patient tolerated this procedure well.   COMPLICATIONS: None  CONDITION: [name]  Office: [phone]   04/25/2018, 12:06 PM

## 2018-04-25 NOTE — Consult Note (Addendum)
Pharmacy Antibiotic Note  Gregory Tyler is a 78 y.o. male admitted on 04/21/2018 with wound infection.  Pharmacy has been consulted for Cefepime dosing.  Plan: Cefepime 1g q 12hours (renally adjusted from 2g q12h)   Height: 5\' 8"  (172.7 cm) Weight: 160 lb (72.6 kg) IBW/kg (Calculated) : 68.4  Temp (24hrs), Avg:98.2 F (36.8 C), Min:98.1 F (36.7 C), Max:98.2 F (36.8 C)  Recent Labs  Lab 04/21/18 1606 04/21/18 1619 04/23/18 0402  WBC  --  13.0* 10.8*  CREATININE  --  1.49* 1.51*  LATICACIDVEN 0.97  --   --     Estimated Creatinine Clearance: 39 mL/min (A) (by C-G formula based on SCr of 1.51 mg/dL (H)).    Allergies  Allergen Reactions  . Aspirin Other (See Comments)    GI Bleeding  . Tuberculin Tests Other (See Comments)    unknown  . Codeine Other (See Comments)    BLISTERS     Antimicrobials this admission: Vancomycin 12/30 >> 1/2  Cefepime 12/30 >>   Dose adjustments this admission: N/A  Microbiology results: 12/30 BCx: NGTD 12/31 wound Cx: + moderate proteus mirabilis. And few gram variable rod.   Thank you for allowing pharmacy to be a part of this patient's care.  Oswald Hillock, PharmD, BCPS Clinical Pharmacist 04/25/2018 9:48 AM

## 2018-04-25 NOTE — Progress Notes (Signed)
[place] at [place] NAME: Gregory Tyler    MR#:  [id number]  DATE OF BIRTH:  1940/09/05  SUBJECTIVE:   Patient went to the OR to have his wound VAC changed today.  Sensitivities on the wound cultures are still pending.  Patient is afebrile.  Denies any complaints.  REVIEW OF SYSTEMS:    Review of Systems  Constitutional: Negative for chills and fever.  HENT: Negative for congestion and tinnitus.   Eyes: Negative for blurred vision and double vision.  Respiratory: Negative for cough, shortness of breath and wheezing.   Cardiovascular: Negative for chest pain, orthopnea and PND.  Gastrointestinal: Negative for abdominal pain, diarrhea, nausea and vomiting.  Genitourinary: Negative for dysuria and hematuria.  Neurological: Negative for dizziness, sensory change and focal weakness.  All other systems reviewed and are negative.   Nutrition: Carb control Tolerating Diet: yes Tolerating PT: Bedbound at baseline.  DRUG ALLERGIES:   Allergies  Allergen Reactions  . Aspirin Other (See Comments)    GI Bleeding  . Tuberculin Tests Other (See Comments)    unknown  . Codeine Other (See Comments)    BLISTERS     VITALS:  Blood pressure 101/68, pulse 70, temperature (!) 97.4 F (36.3 C), temperature source Oral, resp. rate 17, height 5\' 8"  (1.727 m), weight 72.6 kg, SpO2 98 %.  PHYSICAL EXAMINATION:   Physical Exam  GENERAL:  78 y.o.-year-old patient lying in bed in no acute distress.  EYES: Pupils equal, round, reactive to light and accommodation. No scleral icterus. Extraocular muscles intact.  HEENT: Head atraumatic, normocephalic. Oropharynx and nasopharynx clear.  NECK:  Supple, no jugular venous distention. No thyroid enlargement, no tenderness.  LUNGS: Normal breath sounds bilaterally, no wheezing, rales, rhonchi. No use of accessory muscles of respiration.  CARDIOVASCULAR: S1, S2 normal. No murmurs, rubs, or gallops.    ABDOMEN: Soft, nontender, nondistended. Bowel sounds present. No organomegaly or mass.  EXTREMITIES: No cyanosis, clubbing or edema b/l.    NEUROLOGIC: Cranial nerves II through XII are intact. No focal Motor or sensory deficits b/l.  Globally weak PSYCHIATRIC: The patient is alert and oriented x 3.  SKIN: No obvious rash, lesion, right calf ulcer with wound vac in place, bilateral heel pressure ulcers which are stage I-II.   LABORATORY PANEL:   CBC Recent Labs  Lab 04/23/18 0402  WBC 10.8*  HGB 9.3*  HCT 30.2*  PLT 280   ------------------------------------------------------------------------------------------------------------------  Chemistries  Recent Labs  Lab 04/21/18 1619 04/23/18 0402  NA 134* 135  K 4.0 3.9  CL 101 106  CO2 23 22  GLUCOSE 227* 178*  BUN 41* 36*  CREATININE 1.49* 1.51*  CALCIUM 8.3* 8.0*  AST 94*  --   ALT 72*  --   ALKPHOS 118  --   BILITOT 0.6  --    ------------------------------------------------------------------------------------------------------------------  Cardiac Enzymes No results for input(s): TROPONINI in the last 168 hours. ------------------------------------------------------------------------------------------------------------------  RADIOLOGY:  No results found.   ASSESSMENT AND PLAN:   78 year old male with past medical history of COPD, diabetes, hypertension, hyperlipidemia, paroxysmal atrial fibrillation, history of prostate cancer, combined diastolic systolic CHF, gout who presents to the hospital due to pain in his lower extremities and noted to have infected right calf ulcer.  1.  Infected right calf ulcer- secondary to peripheral vascular disease.   -Seen by vascular surgery and status post surgical debridement postop day #3 today.  Had wound vac changed by vascular  today. Await sensitivities on wound cultures.  - cont. Cefepime for now and will switch to Oral meds upon discharge.   -possible d/c tomorrow.    2.  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 - BS stable.   3.  History of paroxysmal atrial fibrillation- rate controlled.  Continue Cardizem, digoxin, metoprolol. - cont. Eliquis  4.  COPD-no acute exacerbation-continue Spiriva, duo nebs as needed  5.  Diabetic neuropathy-continue gabapentin.  6.  History of gout-no acute attack.  Continue allopurinol.  Likely discharge on Oral abx in a.m. tomorrow.   All the records are reviewed and case discussed with Care Management/Social Worker. Management plans discussed with the patient, family and they are in agreement.  CODE STATUS: Full code  DVT Prophylaxis: Eliquis  TOTAL TIME TAKING CARE OF THIS PATIENT: 30 minutes.   POSSIBLE D/C IN 1-2 DAYS, DEPENDING ON CLINICAL CONDITION.   Henreitta Leber M.D on 04/25/2018 at 3:53 PM  Between 7am to 6pm - Pager - [phone]  After 6pm go to [email or web address] - Proofreader  [facility]  Office  [phone]  CC: Primary care physician; [name], MD

## 2018-04-25 NOTE — Plan of Care (Signed)

## 2018-04-26 LAB — CBC
HEMATOCRIT: 29.6 % — AB (ref 39.0–52.0)
HEMOGLOBIN: 9 g/dL — AB (ref 13.0–17.0)
MCH: 28 pg (ref 26.0–34.0)
MCHC: 30.4 g/dL (ref 30.0–36.0)
MCV: 92.2 fL (ref 80.0–100.0)
Platelets: 271 10*3/uL (ref 150–400)
RBC: 3.21 MIL/uL — AB (ref 4.22–5.81)
RDW: 15.2 % (ref 11.5–15.5)
WBC: 8.7 10*3/uL (ref 4.0–10.5)
nRBC: 0 % (ref 0.0–0.2)

## 2018-04-26 LAB — CULTURE, BLOOD (ROUTINE X 2)
Culture: NO GROWTH
Culture: NO GROWTH
Special Requests: ADEQUATE
Special Requests: ADEQUATE

## 2018-04-26 LAB — GLUCOSE, CAPILLARY
Glucose-Capillary: 146 mg/dL — ABNORMAL HIGH (ref 70–99)
Glucose-Capillary: 211 mg/dL — ABNORMAL HIGH (ref 70–99)
Glucose-Capillary: 272 mg/dL — ABNORMAL HIGH (ref 70–99)
Glucose-Capillary: 223 mg/dL — ABNORMAL HIGH (ref 70–99)

## 2018-04-26 MED ORDER — CEFUROXIME AXETIL 500 MG PO TABS
500.0000 mg | ORAL_TABLET | Freq: Two times a day (BID) | ORAL | Status: DC
  Administered 2018-04-26 – 2018-04-28 (×4): 500 mg via ORAL
  Filled 2018-04-26 (×5): qty 1

## 2018-04-26 NOTE — Plan of Care (Signed)

## 2018-04-26 NOTE — Progress Notes (Signed)
[place] at [place] NAME: Gregory Tyler    MR#:  [id number]  DATE OF BIRTH:  [birth year]  SUBJECTIVE:   S/p Wound Vac change yesterday. Proteus growing on the wound is fairly pansensitive.  NO Other acute events overnight or complaints presently.  REVIEW OF SYSTEMS:    Review of Systems  Constitutional: Negative for chills and fever.  HENT: Negative for congestion and tinnitus.   Eyes: Negative for blurred vision and double vision.  Respiratory: Negative for cough, shortness of breath and wheezing.   Cardiovascular: Negative for chest pain, orthopnea and PND.  Gastrointestinal: Negative for abdominal pain, diarrhea, nausea and vomiting.  Genitourinary: Negative for dysuria and hematuria.  Neurological: Negative for dizziness, sensory change and focal weakness.  All other systems reviewed and are negative.   Nutrition: Carb control Tolerating Diet: yes Tolerating PT: Bedbound at baseline.  DRUG ALLERGIES:   Allergies  Allergen Reactions  . Aspirin Other (See Comments)    GI Bleeding  . Tuberculin Tests Other (See Comments)    unknown  . Codeine Other (See Comments)    BLISTERS     VITALS:  Blood pressure 94/61, pulse 92, temperature 97.6 F (36.4 C), temperature source Oral, resp. rate (!) 24, height 5\' 8"  (1.727 m), weight 72.6 kg, SpO2 97 %.  PHYSICAL EXAMINATION:   Physical Exam  GENERAL:  78 y.o.-year-old patient lying in bed in no acute distress.  EYES: Pupils equal, round, reactive to light and accommodation. No scleral icterus. Extraocular muscles intact.  HEENT: Head atraumatic, normocephalic. Oropharynx and nasopharynx clear.  NECK:  Supple, no jugular venous distention. No thyroid enlargement, no tenderness.  LUNGS: Normal breath sounds bilaterally, no wheezing, rales, rhonchi. No use of accessory muscles of respiration.  CARDIOVASCULAR: S1, S2 normal. No murmurs, rubs, or gallops.  ABDOMEN: Soft,  nontender, nondistended. Bowel sounds present. No organomegaly or mass.  EXTREMITIES: No cyanosis, clubbing or edema b/l.    NEUROLOGIC: Cranial nerves II through XII are intact. No focal Motor or sensory deficits b/l.  Globally weak PSYCHIATRIC: The patient is alert and oriented x 3.  SKIN: No obvious rash, lesion, right calf ulcer with wound vac in place, bilateral heel pressure ulcers which are stage I-II.   LABORATORY PANEL:   CBC Recent Labs  Lab 04/26/18 0302  WBC 8.7  HGB 9.0*  HCT 29.6*  PLT 271   ------------------------------------------------------------------------------------------------------------------  Chemistries  Recent Labs  Lab 04/21/18 1619 04/23/18 0402  NA 134* 135  K 4.0 3.9  CL 101 106  CO2 23 22  GLUCOSE 227* 178*  BUN 41* 36*  CREATININE 1.49* 1.51*  CALCIUM 8.3* 8.0*  AST 94*  --   ALT 72*  --   ALKPHOS 118  --   BILITOT 0.6  --    ------------------------------------------------------------------------------------------------------------------  Cardiac Enzymes No results for input(s): TROPONINI in the last 168 hours. ------------------------------------------------------------------------------------------------------------------  RADIOLOGY:  No results found.   ASSESSMENT AND PLAN:   78 year old male with past medical history of COPD, diabetes, hypertension, hyperlipidemia, paroxysmal atrial fibrillation, history of prostate cancer, combined diastolic systolic CHF, gout who presents to the hospital due to pain in his lower extremities and noted to have infected right calf ulcer.  1.  Infected right calf ulcer- secondary to peripheral vascular disease.   -Seen by vascular surgery and status post surgical debridement postop day #4 today.  Had wound vac changed by vascular yesterday.  -Proteus in the wound is fairly  pansensitive.  Will DC IV cefepime, switch to oral Ceftin.  2.  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 - BS stable.   3.  History of paroxysmal atrial fibrillation- rate controlled.  Continue Cardizem, digoxin, metoprolol. - cont. Eliquis  4.  COPD-no acute exacerbation-continue Spiriva, duo nebs as needed  5.  Diabetic neuropathy-continue gabapentin.  6.  History of gout-no acute attack.  Continue allopurinol.  Likely discharge on Monday once the skilled nursing facility has a wound vac on Oral abx.   All the records are reviewed and case discussed with Care Management/Social Worker. Management plans discussed with the patient, family and they are in agreement.  CODE STATUS: Full code  DVT Prophylaxis: Eliquis  TOTAL TIME TAKING CARE OF THIS PATIENT: 30 minutes.   POSSIBLE D/C IN 1-2 DAYS, DEPENDING ON CLINICAL CONDITION.   Henreitta Leber M.D on 04/26/2018 at 3:26 PM  Between 7am to 6pm - Pager - [phone]  After 6pm go to [email or web address] - Proofreader  [facility]  Office  [phone]  CC: Primary care physician; [name], MD

## 2018-04-26 NOTE — Clinical Social Work Note (Signed)
The CSW contacted [facility] to inquire if the patient can return today. [facility] has not yet received the wound vac, and the estimated day of deliver is Monday, 1/6. The CSW has updated the medical team and is following for discharge facilitation.  Santiago Bumpers, MSW, Latanya Presser [phone]

## 2018-04-26 NOTE — Consult Note (Signed)
Pharmacy Antibiotic Note  KAMEREN PARGAS is a 78 y.o. male admitted on 04/21/2018 with wound infection.  Pharmacy has been consulted for Cefepime dosing.  Plan: Cefepime 1g q 12hours.   Patient with pan sensitive proteus mirabilis. Please consider narrowing as appropriate.    Height: 5\' 8"  (172.7 cm) Weight: 160 lb (72.6 kg) IBW/kg (Calculated) : 68.4  Temp (24hrs), Avg:97.8 F (36.6 C), Min:97.7 F (36.5 C), Max:97.8 F (36.6 C)  Recent Labs  Lab 04/21/18 1606 04/21/18 1619 04/23/18 0402 04/26/18 0302  WBC  --  13.0* 10.8* 8.7  CREATININE  --  1.49* 1.51*  --   LATICACIDVEN 0.97  --   --   --     Estimated Creatinine Clearance: 39 mL/min (A) (by C-G formula based on SCr of 1.51 mg/dL (H)).    Allergies  Allergen Reactions  . Aspirin Other (See Comments)    GI Bleeding  . Tuberculin Tests Other (See Comments)    unknown  . Codeine Other (See Comments)    BLISTERS     Antimicrobials this admission: Vancomycin 12/30 >> 1/2  Cefepime 12/30 >>   Dose adjustments this admission: N/A  Microbiology results: 12/30 BCx: NGTD 12/31 Wound Cx: pansensitive proteus mirabilis   Thank you for allowing pharmacy to be a part of this patient's care.  Simpson,Michael L 04/26/2018 12:33 PM

## 2018-04-27 LAB — GLUCOSE, CAPILLARY
GLUCOSE-CAPILLARY: 146 mg/dL — AB (ref 70–99)
GLUCOSE-CAPILLARY: 179 mg/dL — AB (ref 70–99)
Glucose-Capillary: 252 mg/dL — ABNORMAL HIGH (ref 70–99)
Glucose-Capillary: 356 mg/dL — ABNORMAL HIGH (ref 70–99)

## 2018-04-27 NOTE — Progress Notes (Signed)
[place] at [place] NAME: Gregory Tyler    MR#:  [id number]  DATE OF BIRTH:  Sep 16, 1940  SUBJECTIVE:   No acute events overnight and no new complaints and will d/c tomorrow to SNF once Wound vac has been arranged for.   REVIEW OF SYSTEMS:    Review of Systems  Constitutional: Negative for chills and fever.  HENT: Negative for congestion and tinnitus.   Eyes: Negative for blurred vision and double vision.  Respiratory: Negative for cough, shortness of breath and wheezing.   Cardiovascular: Negative for chest pain, orthopnea and PND.  Gastrointestinal: Negative for abdominal pain, diarrhea, nausea and vomiting.  Genitourinary: Negative for dysuria and hematuria.  Neurological: Negative for dizziness, sensory change and focal weakness.  All other systems reviewed and are negative.   Nutrition: Carb control Tolerating Diet: yes Tolerating PT: Bedbound at baseline.  DRUG ALLERGIES:   Allergies  Allergen Reactions  . Aspirin Other (See Comments)    GI Bleeding  . Tuberculin Tests Other (See Comments)    unknown  . Codeine Other (See Comments)    BLISTERS     VITALS:  Blood pressure 113/71, pulse 88, temperature 98.4 F (36.9 C), resp. rate 20, height 5\' 8"  (1.727 m), weight 72.6 kg, SpO2 98 %.  PHYSICAL EXAMINATION:   Physical Exam  GENERAL:  78 y.o.-year-old patient lying in bed in no acute distress.  EYES: Pupils equal, round, reactive to light and accommodation. No scleral icterus. Extraocular muscles intact.  HEENT: Head atraumatic, normocephalic. Oropharynx and nasopharynx clear.  NECK:  Supple, no jugular venous distention. No thyroid enlargement, no tenderness.  LUNGS: Normal breath sounds bilaterally, no wheezing, rales, rhonchi. No use of accessory muscles of respiration.  CARDIOVASCULAR: S1, S2 normal. No murmurs, rubs, or gallops.  ABDOMEN: Soft, nontender, nondistended. Bowel sounds present. No  organomegaly or mass.  EXTREMITIES: No cyanosis, clubbing or edema b/l.    NEUROLOGIC: Cranial nerves II through XII are intact. No focal Motor or sensory deficits b/l.  Globally weak PSYCHIATRIC: The patient is alert and oriented x 3.  SKIN: No obvious rash, lesion, right calf ulcer with wound vac in place, bilateral heel pressure ulcers which are stage I-II.   LABORATORY PANEL:   CBC Recent Labs  Lab 04/26/18 0302  WBC 8.7  HGB 9.0*  HCT 29.6*  PLT 271   ------------------------------------------------------------------------------------------------------------------  Chemistries  Recent Labs  Lab 04/21/18 1619 04/23/18 0402  NA 134* 135  K 4.0 3.9  CL 101 106  CO2 23 22  GLUCOSE 227* 178*  BUN 41* 36*  CREATININE 1.49* 1.51*  CALCIUM 8.3* 8.0*  AST 94*  --   ALT 72*  --   ALKPHOS 118  --   BILITOT 0.6  --    ------------------------------------------------------------------------------------------------------------------  Cardiac Enzymes No results for input(s): TROPONINI in the last 168 hours. ------------------------------------------------------------------------------------------------------------------  RADIOLOGY:  No results found.   ASSESSMENT AND PLAN:   78 year old male with past medical history of COPD, diabetes, hypertension, hyperlipidemia, paroxysmal atrial fibrillation, history of prostate cancer, combined diastolic systolic CHF, gout who presents to the hospital due to pain in his lower extremities and noted to have infected right calf ulcer.  1.  Infected right calf ulcer- secondary to peripheral vascular disease.   -Seen by vascular surgery and status post surgical debridement postop day #5 today.  Had wound vac changed by vascular on 04/25/18.  -Proteus in the wound is fairly pansensitive.  Cont.  Oral Ceftin for a total for 2 weeks.  -Wound VAC to be arranged at the skilled nursing facility and patient can likely be discharged tomorrow once  that is done.  2.  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 - BS stable.   3.  History of paroxysmal atrial fibrillation- rate controlled.  Continue Cardizem, digoxin, metoprolol. - cont. Eliquis  4.  COPD-no acute exacerbation-continue Spiriva, duo nebs as needed  5.  Diabetic neuropathy-continue gabapentin.  6.  History of gout-no acute attack.  Continue allopurinol.  Likely discharge on Monday once the skilled nursing facility has a wound vac on Oral abx.   All the records are reviewed and case discussed with Care Management/Social Worker. Management plans discussed with the patient, family and they are in agreement.  CODE STATUS: Full code  DVT Prophylaxis: Eliquis  TOTAL TIME TAKING CARE OF THIS PATIENT: 30 minutes.   POSSIBLE D/C IN 1-2 DAYS, DEPENDING ON CLINICAL CONDITION.   Henreitta Leber M.D on 04/27/2018 at 1:32 PM  Between 7am to 6pm - Pager - [phone]  After 6pm go to [email or web address] - Proofreader  [facility]  Office  [phone]  CC: Primary care physician; [name], MD

## 2018-04-27 NOTE — Progress Notes (Signed)
Wound VAC placed 2 days prior, Friday.  Arrangements being made for wound VAC therapy in nursing facility.  He remains immobile, at high risk for further pressure ulceration.  Wound VAC appears to be functioning well currently.  Can go when wound VAC available for use on 3-day a week basis.  Follow-up with Dr. Delana Meyer as outpatient.  Zara Chess, MD

## 2018-04-28 LAB — AEROBIC/ANAEROBIC CULTURE W GRAM STAIN (SURGICAL/DEEP WOUND)

## 2018-04-28 LAB — GLUCOSE, CAPILLARY
GLUCOSE-CAPILLARY: 154 mg/dL — AB (ref 70–99)
GLUCOSE-CAPILLARY: 255 mg/dL — AB (ref 70–99)
Glucose-Capillary: 238 mg/dL — ABNORMAL HIGH (ref 70–99)

## 2018-04-28 MED ORDER — METOPROLOL TARTRATE 25 MG PO TABS: 12.5000 mg | ORAL_TABLET | Freq: Two times a day (BID) | ORAL | 0 refills | Status: DC

## 2018-04-28 MED ORDER — CEFUROXIME AXETIL 500 MG PO TABS: 500.0000 mg | ORAL_TABLET | Freq: Two times a day (BID) | ORAL | 0 refills | Status: AC

## 2018-04-28 MED ORDER — HYDROCODONE-ACETAMINOPHEN 5-325 MG PO TABS: 1.0000 | ORAL_TABLET | Freq: Four times a day (QID) | ORAL | 0 refills | Status: DC | PRN

## 2018-04-28 NOTE — Progress Notes (Signed)
Sacrum cleaned with NS pat dry, covered with silver hydrofiber top with 2x2 gauze and foam dressing.

## 2018-04-28 NOTE — Discharge Summary (Signed)
[facility] NAME: Gregory Tyler    MR#:  [id number]  DATE OF BIRTH:  08/12/1940  DATE OF ADMISSION:  04/21/2018 ADMITTING PHYSICIAN: Loletha Grayer, MD  DATE OF DISCHARGE: 04/28/2018   PRIMARY CARE PHYSICIAN: [name], MD    ADMISSION DIAGNOSIS:  Cellulitis of right lower extremity [G86.761]  DISCHARGE DIAGNOSIS:  Active Problems:   Wound infection   SECONDARY DIAGNOSIS:   Past Medical History:  Diagnosis Date  . Anemia   . Anxiety   . Arthritis   . Cardiomyopathy ([place])    a. 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  Marland Kitchen Chronic combined systolic (congestive) and diastolic (congestive) heart failure ([place])    a. 05/2015 TEE: EF 35-40% (in setting of Afib); b. 08/2017 Echo: EF 55-60%, no rwma, Gr2DD; b. 01/2018 Echo: EF 55-60%, mild MR. Nl LA size. Nl RV fxn.  . Chronic Pelvic pain   . Coronary artery disease    a. 05/2015 CTA Chest: 3 vessel coronary Ca2+.  . Depression   . Diabetes mellitus without complication ([place])   . Emphysema lung ([place])   . Gastric ulcer   . Gout   . Hyperlipidemia   . Hypertension   . Nephrolithiasis   . PAF (paroxysmal atrial fibrillation) ([place])    a. 05/2015 s/p DCCV; b. 03/2016 s/p DCCV; c. CHA2DS2VASc = 6 ([facility]).  . Prostate cancer [facility])    Prostate  . Stage III chronic kidney disease ([place])   . Weight loss    50 lb since Apr 24, 2015    HOSPITAL COURSE:   78 year old male with past medical history of COPD, diabetes, hypertension, hyperlipidemia, paroxysmal atrial fibrillation, history of prostate cancer, combined diastolic systolic CHF, gout who presents to the hospital due to pain in his lower extremities and noted to have infected right calf ulcer.  1.  Infected right calf ulcer- secondary to peripheral vascular disease.   -Seen by vascular surgery and status post surgical debridement postop day #5 today.  Had wound vac changed by vascular on  04/25/18.  -Proteus in the wound is fairly pansensitive.  Cont. Oral Ceftin for a total for 2 weeks.  -Wound VAC to be arranged at the skilled nursing facility and ned follow up with vascular clinic.  2.  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 - BS stable.   3.  History of paroxysmal atrial fibrillation- rate controlled.  Continue Cardizem, digoxin, metoprolol. - cont. Eliquis  4.  COPD-no acute exacerbation-continue Spiriva, duo nebs as needed  5.  Diabetic neuropathy-continue gabapentin.  6.  History of gout-no acute attack.  Continue allopurinol.  DISCHARGE CONDITIONS:   Stable.  CONSULTS OBTAINED:  Treatment Team:  Katha Cabal, MD  DRUG ALLERGIES:   Allergies  Allergen Reactions  . Aspirin Other (See Comments)    GI Bleeding  . Tuberculin Tests Other (See Comments)    unknown  . Codeine Other (See Comments)    BLISTERS     DISCHARGE MEDICATIONS:   Allergies as of 04/28/2018      Reactions   Aspirin Other (See Comments)   GI Bleeding   Tuberculin Tests Other (See Comments)   unknown   Codeine Other (See Comments)   BLISTERS      Medication List    TAKE these medications   acetaminophen 325 MG tablet Commonly known as:  TYLENOL Take 2 tablets (650 mg total)  by mouth every 6 (six) hours as needed for mild pain (or Fever >/= 101).   allopurinol 300 MG tablet Commonly known as:  ZYLOPRIM Take 300 mg by mouth daily.   apixaban 5 MG Tabs tablet Commonly known as:  ELIQUIS Take 1 tablet (5 mg total) by mouth 2 (two) times daily.   ARTIFICIAL TEARS 0.4 % Soln Generic drug:  Hypromellose Place 1 drop into both eyes 2 (two) times daily.   BETADINE EX Apply topically every evening. Apply to left heel and left lateral foot   cefUROXime 500 MG tablet Commonly known as:  CEFTIN Take 1 tablet (500 mg total) by mouth 2 (two) times daily with a meal for 12 days.   collagenase ointment Commonly known as:   SANTYL Apply 1 application topically every evening. Apply to eschar on RLE wound   Digoxin 62.5 MCG Tabs Take 0.0625 mg by mouth daily.   diltiazem 30 MG tablet Commonly known as:  CARDIZEM Take 1 tablet (30 mg total) by mouth every 8 (eight) hours.   gabapentin 100 MG capsule Commonly known as:  NEURONTIN Take 1 capsule (100 mg total) by mouth at bedtime.   HYDROcodone-acetaminophen 5-325 MG tablet Commonly known as:  NORCO/VICODIN Take 1 tablet by mouth every 6 (six) hours as needed for moderate pain or severe pain.   metFORMIN 500 MG tablet Commonly known as:  GLUCOPHAGE Take 1 tablet (500 mg total) by mouth daily with breakfast.   metoprolol tartrate 25 MG tablet Commonly known as:  LOPRESSOR Take 0.5 tablets (12.5 mg total) by mouth 2 (two) times daily. What changed:  how much to take   rosuvastatin 20 MG tablet Commonly known as:  CRESTOR Take 20 mg by mouth at bedtime.   tiotropium 18 MCG inhalation capsule Commonly known as:  SPIRIVA Place 18 mcg into inhaler and inhale daily.   torsemide 20 MG tablet Commonly known as:  DEMADEX Take 20 mg by mouth daily.        DISCHARGE INSTRUCTIONS:    Follow with vascular clinic in 1 week.  If you experience worsening of your admission symptoms, develop shortness of breath, life threatening emergency, suicidal or homicidal thoughts you must seek medical attention immediately by calling 911 or calling your MD immediately  if symptoms less severe.  You Must read complete instructions/literature along with all the possible adverse reactions/side effects for all the Medicines you take and that have been prescribed to you. Take any new Medicines after you have completely understood and accept all the possible adverse reactions/side effects.   Please note  You were cared for by a hospitalist during your hospital stay. If you have any questions about your discharge medications or the care you received while you were in the  hospital after you are discharged, you can call the unit and asked to speak with the hospitalist on call if the hospitalist that took care of you is not available. Once you are discharged, your primary care physician will handle any further medical issues. Please note that NO REFILLS for any discharge medications will be authorized once you are discharged, as it is imperative that you return to your primary care physician (or establish a relationship with a primary care physician if you do not have one) for your aftercare needs so that they can reassess your need for medications and monitor your lab values.    Today   CHIEF COMPLAINT:   Chief Complaint  Patient presents with  . Wound Infection  HISTORY OF PRESENT ILLNESS:  Gregory Tyler  is a 78 y.o. male with a known history of atrial fibrillation on Eliquis and chronic wounds of his lower extremities.  The patient does not walk and he is at long-term care.  He has bilateral heel ulcerations and a right calf large ulcer that he has been following at the wound care center for.  The wound care center referred him into the hospital for further evaluation.  The ulcers have become larger and foul-smelling.  The patient states that he has pain very severe in his right calf.  He states the heels also are very tender.  Hospitalist services were contacted for further evaluation.  The patient recently had a revascularization procedure a few weeks ago by vascular surgery.  ER physician contacted Dr. Delana Meyer to see the patient in evaluation.  VITAL SIGNS:  Blood pressure 118/63, pulse 84, temperature 98.2 F (36.8 C), temperature source Oral, resp. rate 16, height 5\' 8"  (1.727 m), weight 72.6 kg, SpO2 98 %.  I/O:    Intake/Output Summary (Last 24 hours) at 04/28/2018 1303 Last data filed at 04/28/2018 0951 Gross per 24 hour  Intake 480 ml  Output -  Net 480 ml    PHYSICAL EXAMINATION:   GENERAL:  78 y.o.-year-old patient lying in bed in no  acute distress.  EYES: Pupils equal, round, reactive to light and accommodation. No scleral icterus. Extraocular muscles intact.  HEENT: Head atraumatic, normocephalic. Oropharynx and nasopharynx clear.  NECK:  Supple, no jugular venous distention. No thyroid enlargement, no tenderness.  LUNGS: Normal breath sounds bilaterally, no wheezing, rales, rhonchi. No use of accessory muscles of respiration.  CARDIOVASCULAR: S1, S2 normal. No murmurs, rubs, or gallops.  ABDOMEN: Soft, nontender, nondistended. Bowel sounds present. No organomegaly or mass.  EXTREMITIES: No cyanosis, clubbing or edema b/l.    NEUROLOGIC: Cranial nerves II through XII are intact. No focal Motor or sensory deficits b/l.  Globally weak PSYCHIATRIC: The patient is alert and oriented x 3.  SKIN: No obvious rash, lesion, right calf ulcer with wound vac in place, bilateral heel pressure ulcers which are stage I-II.  DATA REVIEW:   CBC Recent Labs  Lab 04/26/18 0302  WBC 8.7  HGB 9.0*  HCT 29.6*  PLT 271    Chemistries  Recent Labs  Lab 04/21/18 1619 04/23/18 0402  NA 134* 135  K 4.0 3.9  CL 101 106  CO2 23 22  GLUCOSE 227* 178*  BUN 41* 36*  CREATININE 1.49* 1.51*  CALCIUM 8.3* 8.0*  AST 94*  --   ALT 72*  --   ALKPHOS 118  --   BILITOT 0.6  --     Cardiac Enzymes No results for input(s): TROPONINI in the last 168 hours.  Microbiology Results  Results for orders placed or performed during the hospital encounter of 04/21/18  Blood culture (routine x 2)     Status: None   Collection Time: 04/21/18  4:16 PM  Result Value Ref Range Status   Specimen Description BLOOD WRIST  Final   Special Requests   Final    BOTTLES DRAWN AEROBIC AND ANAEROBIC Blood Culture adequate volume   Culture   Final    NO GROWTH 5 DAYS Performed at [facility], [address], [place], [place] [address]    Report Status 04/26/2018 FINAL  Final  Blood culture (routine x 2)     Status: None   Collection Time:  04/21/18  4:19 PM  Result Value  Ref Range Status   Specimen Description BLOOD BLOOD LEFT ARM  Final   Special Requests   Final    BOTTLES DRAWN AEROBIC AND ANAEROBIC Blood Culture adequate volume   Culture   Final    NO GROWTH 5 DAYS Performed at [facility], [address] [place], [place] [address]    Report Status 04/26/2018 FINAL  Final  MRSA PCR Screening     Status: None   Collection Time: 04/21/18  8:12 PM  Result Value Ref Range Status   MRSA by PCR NEGATIVE NEGATIVE Final    Comment:        The GeneXpert MRSA Assay (FDA approved for NASAL specimens only), is one component of a comprehensive MRSA colonization surveillance program. It is not intended to diagnose MRSA infection nor to guide or monitor treatment for MRSA infections. Performed at [facility], [address] [place], [place] [address]   Aerobic/Anaerobic Culture (surgical/deep wound)     Status: None (Preliminary result)   Collection Time: 04/22/18  5:29 PM  Result Value Ref Range Status   Specimen Description WOUND RIGHT CALF SAMPLE 1  Final   Special Requests NONE  Final   Gram Stain   Final    MODERATE WBC PRESENT,BOTH PMN AND MONONUCLEAR FEW GRAM POSITIVE COCCI FEW GRAM VARIABLE ROD    Culture   Final    MODERATE PROTEUS MIRABILIS NO ANAEROBES ISOLATED; CULTURE IN PROGRESS FOR 5 DAYS    Report Status PENDING  Incomplete   Organism ID, Bacteria PROTEUS MIRABILIS  Final      Susceptibility   Proteus mirabilis - MIC*    AMPICILLIN <=2 SENSITIVE Sensitive     CEFAZOLIN <=4 SENSITIVE Sensitive     CEFEPIME <=1 SENSITIVE Sensitive     CEFTAZIDIME <=1 SENSITIVE Sensitive     CEFTRIAXONE <=1 SENSITIVE Sensitive     CIPROFLOXACIN <=0.25 SENSITIVE Sensitive     GENTAMICIN <=1 SENSITIVE Sensitive     IMIPENEM 2 SENSITIVE Sensitive     TRIMETH/SULFA <=20 SENSITIVE Sensitive     AMPICILLIN/SULBACTAM <=2 SENSITIVE Sensitive     PIP/TAZO Value in next row Sensitive      <=4  SENSITIVEPerformed at [facility] [address], [place], [place] [address]    * MODERATE PROTEUS MIRABILIS    RADIOLOGY:  No results found.  EKG:   Orders placed or performed during the hospital encounter of 04/21/18  . EKG 12-Lead  . EKG 12-Lead      Management plans discussed with the patient, family and they are in agreement.  CODE STATUS:  Code Status History    Date Active Date Inactive Code Status Order ID Comments User Context   04/07/2018 1848 04/10/2018 1828 DNR [id number]  Henreitta Leber, MD Inpatient   01/20/2018 1242 01/30/2018 2345 DNR [id number]  Knox Royalty, NP Inpatient   01/19/2018 1329 01/20/2018 1242 Full Code [id number]  Gorden Harms, MD Inpatient   01/13/2018 1712 01/19/2018 1329 DNR [id number]  Dustin Flock, MD Inpatient   12/19/2017 1932 12/20/2017 2151 DNR [id number]  [name], MD Inpatient   11/17/2017 1357 11/19/2017 1603 DNR [id number]  Hermelinda Dellen, DO Inpatient   11/15/2017 1550 11/17/2017 1357 Full Code [id number]  Loletha Grayer, MD ED   09/11/2017 1951 09/15/2017 1603 Full Code [id number]  Bettey Costa, MD ED   05/11/2015 1307 05/13/2015 1256 Full Code [id number]  Aldean Jewett, MD Inpatient   04/25/2015 0149 04/27/2015 1741 Full Code [id number]  Hillary Bow,  MD ED    Questions for Most Recent Historical Code Status (Order [id number])    Question Answer Comment   In the event of cardiac or respiratory ARREST Do not call a "code blue"    In the event of cardiac or respiratory ARREST Do not perform Intubation, CPR, defibrillation or ACLS    In the event of cardiac or respiratory ARREST Use medication by any route, position, wound care, and other measures to relive pain and suffering. May use oxygen, suction and manual treatment of airway obstruction as needed for comfort.         Advance Directive Documentation     Most Recent Value  Type of Advance Directive  Healthcare Power of Attorney  Pre-existing out of facility DNR order (yellow  form or pink MOST form)  -  "MOST" Form in Place?  -      TOTAL TIME TAKING CARE OF THIS PATIENT: 35 minutes.    [name] M.D on 04/28/2018 at 1:03 PM  Between 7am to 6pm - Pager - [phone]  After 6pm go to [email or web address] - password [email or web address] [facility]  Office  [phone]  CC: Primary care physician; [name], MD   Note: This dictation was prepared with Dragon dictation along with smaller phrase technology. Any transcriptional errors that result from this process are unintentional.

## 2018-04-28 NOTE — Clinical Social Work Note (Signed)
Patient is medically ready for discharge back to [facility] today. CSW notified patient's son Neftaly Swiss [phone] of discharge today. Son is in agreement with discharge back to [facility]. CSW also notified Magda Paganini at [facility] of discharge today. Magda Paganini states that patient can come and they have received wound vac. Patient will be transported by EMS. RN to call report and call for transport.   [place], [place]

## 2018-04-28 NOTE — Progress Notes (Signed)
Inpatient Diabetes Program Recommendations  AACE/ADA: New Consensus Statement on Inpatient Glycemic Control   Target Ranges:  Prepandial:   less than 140 mg/dL      Peak postprandial:   less than 180 mg/dL (1-2 hours)      Critically ill patients:  140 - 180 mg/dL   Results for SUMIT, BRANHAM (MRN [id number]) as of 04/28/2018 12:35  Ref. Range 04/27/2018 07:58 04/27/2018 11:57 04/27/2018 17:29 04/27/2018 20:34 04/28/2018 07:38 04/28/2018 11:48  Glucose-Capillary Latest Ref Range: 70 - 99 mg/dL 146 (H) 179 (H) 252 (H) 356 (H) 154 (H) 255 (H)   Review of Glycemic Control  Diabetes history: DM2 Outpatient Diabetes medications: Metformin 500 mg QAM Current orders for Inpatient glycemic control: Novolog 0-9 units TID with meals, Novolog 0-5 units QHS  Inpatient Diabetes Program Recommendations:  Insulin - Meal Coverage: Please consider ordering Novolog 3 units TID with meals for meal coverage if patient eats at least 50% of meals.  Thanks, Barnie Alderman, RN, MSN, CDE Diabetes Coordinator Inpatient Diabetes Program [phone] (Team Pager from 8am to 5pm)

## 2018-04-28 NOTE — Care Management Important Message (Signed)
Important Message  Patient Details  Name: Gregory Tyler MRN: [id number] Date of Birth: 08/15/1940   Medicare Important Message Given:  Yes    Gregory Tyler 04/28/2018, 10:57 AM

## 2018-04-28 NOTE — Discharge Instructions (Signed)
Vascular Surgery [facility] Discharge Instructions Settings: 125mg , Continuous. Change every Tuesday, Thursday, Saturday. Clean with with normal saline. Pat dry and reapply VAC dressing.  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   2) Cover with size-appropriate piece of silver hydrofiber (Aquacel Ag+, Kellie Simmering [phone]) to wound. 3) Top with dry gauze (2x2) and top with silicone foam dressing for sacrum.   4) Turn side to side and keep HOB at or below a 30 degree angle except for meals.

## 2018-04-28 NOTE — Plan of Care (Signed)

## 2018-04-29 ENCOUNTER — Encounter: Payer: Self-pay | Admitting: Vascular Surgery

## 2018-05-06 ENCOUNTER — Encounter: Payer: Medicare Other | Attending: Physician Assistant | Admitting: Physician Assistant

## 2018-05-06 DIAGNOSIS — Z8249 Family history of ischemic heart disease and other diseases of the circulatory system: Secondary | ICD-10-CM | POA: Diagnosis not present

## 2018-05-06 DIAGNOSIS — I132 Hypertensive heart and chronic kidney disease with heart failure and with stage 5 chronic kidney disease, or end stage renal disease: Secondary | ICD-10-CM | POA: Insufficient documentation

## 2018-05-06 DIAGNOSIS — L89622 Pressure ulcer of left heel, stage 2: Secondary | ICD-10-CM | POA: Insufficient documentation

## 2018-05-06 DIAGNOSIS — L89323 Pressure ulcer of left buttock, stage 3: Secondary | ICD-10-CM | POA: Diagnosis not present

## 2018-05-06 DIAGNOSIS — E11622 Type 2 diabetes mellitus with other skin ulcer: Secondary | ICD-10-CM | POA: Diagnosis not present

## 2018-05-06 DIAGNOSIS — Z87891 Personal history of nicotine dependence: Secondary | ICD-10-CM | POA: Insufficient documentation

## 2018-05-06 DIAGNOSIS — L89610 Pressure ulcer of right heel, unstageable: Secondary | ICD-10-CM | POA: Insufficient documentation

## 2018-05-06 DIAGNOSIS — N186 End stage renal disease: Secondary | ICD-10-CM | POA: Insufficient documentation

## 2018-05-06 DIAGNOSIS — J449 Chronic obstructive pulmonary disease, unspecified: Secondary | ICD-10-CM | POA: Diagnosis not present

## 2018-05-06 DIAGNOSIS — L89153 Pressure ulcer of sacral region, stage 3: Secondary | ICD-10-CM | POA: Insufficient documentation

## 2018-05-06 DIAGNOSIS — L97512 Non-pressure chronic ulcer of other part of right foot with fat layer exposed: Secondary | ICD-10-CM | POA: Diagnosis not present

## 2018-05-06 DIAGNOSIS — R627 Adult failure to thrive: Secondary | ICD-10-CM | POA: Insufficient documentation

## 2018-05-06 DIAGNOSIS — D631 Anemia in chronic kidney disease: Secondary | ICD-10-CM | POA: Diagnosis not present

## 2018-05-06 DIAGNOSIS — I5042 Chronic combined systolic (congestive) and diastolic (congestive) heart failure: Secondary | ICD-10-CM | POA: Insufficient documentation

## 2018-05-06 DIAGNOSIS — E1122 Type 2 diabetes mellitus with diabetic chronic kidney disease: Secondary | ICD-10-CM | POA: Insufficient documentation

## 2018-05-09 NOTE — Progress Notes (Signed)
[name], Tyler ([id number]) Visit Report for 05/06/2018 Arrival Information Details Patient Name: Gregory Tyler, Gregory Tyler. Date of Service: 05/06/2018 2:45 PM Medical Record Number: [id number] Patient Account Number: [id number] Date of Birth/Sex: 12-20-1940 (78 y.o. M) Treating RN: Montey Hora Primary Care Lachlyn Vanderstelt: Fulton Reek Other Clinician: Referring Doniven Vanpatten: Fulton Reek Treating Pattricia Weiher/Extender: Melburn Hake, HOYT Weeks in Treatment: 11 Visit Information History Since Last Visit Added or deleted any medications: No Patient Arrived: Wheel Chair Any new allergies or adverse reactions: No Arrival Time: 15:27 Had a fall or experienced change in No Accompanied By: self activities of daily living that may affect Transfer Assistance: Harrel Lemon Lift risk of falls: Patient Has Alerts: Yes Signs or symptoms of abuse/neglect since last visito No Patient Alerts: Patient on Blood Thinner Hospitalized since last visit: No Eliquis Implantable device outside of the clinic excluding No Diabetic Type II cellular tissue based products placed in the center since last visit: Has Dressing in Place as Prescribed: Yes Pain Present Now: Yes Electronic Signature(s) Signed: 05/06/2018 5:22:36 PM By: Montey Hora Entered By: Montey Hora on 05/06/2018 15:27:30 Gregory Tyler ([id number]) -------------------------------------------------------------------------------- Clinic Level of Care Assessment Details Patient Name: Gregory Tyler. Date of Service: 05/06/2018 2:45 PM Medical Record Number: [id number] Patient Account Number: [id number] Date of Birth/Sex: October 03, 1940 (78 y.o. M) Treating RN: Montey Hora Primary Care Ani Deoliveira: Fulton Reek Other Clinician: Referring Janaya Broy: Fulton Reek Treating Isabell Bonafede/Extender: Melburn Hake, HOYT Weeks in Treatment: 11 Clinic Level of Care Assessment Items TOOL 4 Quantity Score []  - Use when only an EandM is performed on FOLLOW-UP  visit 0 ASSESSMENTS - Nursing Assessment / Reassessment X - Reassessment of Co-morbidities (includes updates in patient status) 1 10 X- 1 5 Reassessment of Adherence to Treatment Plan ASSESSMENTS - Wound and Skin Assessment / Reassessment []  - Simple Wound Assessment / Reassessment - one wound 0 X- 7 5 Complex Wound Assessment / Reassessment - multiple wounds []  - 0 Dermatologic / Skin Assessment (not related to wound area) ASSESSMENTS - Focused Assessment []  - Circumferential Edema Measurements - multi extremities 0 []  - 0 Nutritional Assessment / Counseling / Intervention X- 1 5 Lower Extremity Assessment (monofilament, tuning fork, pulses) []  - 0 Peripheral Arterial Disease Assessment (using hand held doppler) ASSESSMENTS - Ostomy and/or Continence Assessment and Care X - Incontinence Assessment and Management 1 10 []  - 0 Ostomy Care Assessment and Management (repouching, etc.) PROCESS - Coordination of Care X - Simple Patient / Family Education for ongoing care 1 15 []  - 0 Complex (extensive) Patient / Family Education for ongoing care X- 1 10 Staff obtains Programmer, systems, Records, Test Results / Process Orders []  - 0 Staff telephones HHA, Nursing Homes / Clarify orders / etc []  - 0 Routine Transfer to another Facility (non-emergent condition) []  - 0 Routine Hospital Admission (non-emergent condition) []  - 0 New Admissions / Biomedical engineer / Ordering NPWT, Apligraf, etc. []  - 0 Emergency Hospital Admission (emergent condition) X- 1 10 Simple Discharge Coordination FLEM, ENDERLE. ([id number]) []  - 0 Complex (extensive) Discharge Coordination PROCESS - Special Needs []  - Pediatric / Minor Patient Management 0 []  - 0 Isolation Patient Management []  - 0 Hearing / Language / Visual special needs []  - 0 Assessment of Community assistance (transportation, D/C planning, etc.) []  - 0 Additional assistance / Altered mentation []  - 0 Support Surface(s)  Assessment (bed, cushion, seat, etc.) INTERVENTIONS - Wound Cleansing / Measurement []  - Simple Wound Cleansing - one wound 0 X- 7 5 Complex Wound Cleansing - multiple  wounds X- 1 5 Wound Imaging (photographs - any number of wounds) []  - 0 Wound Tracing (instead of photographs) []  - 0 Simple Wound Measurement - one wound X- 7 5 Complex Wound Measurement - multiple wounds INTERVENTIONS - Wound Dressings X - Small Wound Dressing one or multiple wounds 7 10 []  - 0 Medium Wound Dressing one or multiple wounds []  - 0 Large Wound Dressing one or multiple wounds []  - 0 Application of Medications - topical []  - 0 Application of Medications - injection INTERVENTIONS - Miscellaneous []  - External ear exam 0 []  - 0 Specimen Collection (cultures, biopsies, blood, body fluids, etc.) []  - 0 Specimen(s) / Culture(s) sent or taken to Lab for analysis X- 1 10 Patient Transfer (multiple staff / Civil Service fast streamer / Similar devices) []  - 0 Simple Staple / Suture removal (25 or less) []  - 0 Complex Staple / Suture removal (26 or more) []  - 0 Hypo / Hyperglycemic Management (close monitor of Blood Glucose) []  - 0 Ankle / Brachial Index (ABI) - do not check if billed separately X- 1 5 Vital Signs [place], Valdez R. ([id number]) Has the patient been seen at the hospital within the last three years: Yes Total Score: 260 Level Of Care: New/Established - Level 5 Electronic Signature(s) Signed: 05/06/2018 5:22:36 PM By: Montey Hora Entered By: Montey Hora on 05/06/2018 16:45:33 Huezo, [name] ([id number]) -------------------------------------------------------------------------------- Encounter Discharge Information Details Patient Name: Gregory Tyler. Date of Service: 05/06/2018 2:45 PM Medical Record Number: [id number] Patient Account Number: [id number] Date of Birth/Sex: 1940-10-28 (78 y.o. M) Treating RN: Harold Barban Primary Care Bennette Hasty: Fulton Reek Other  Clinician: Referring Orvis Stann: Fulton Reek Treating Boruch Manuele/Extender: Melburn Hake, HOYT Weeks in Treatment: 11 Encounter Discharge Information Items Schedule Follow-up Appointment: No Clinical Summary of Care: Provided Form Type Recipient Paper Patient jt Electronic Signature(s) Signed: 05/06/2018 5:02:52 PM By: Lorine Bears RCP, RRT, CHT Entered By: Lorine Bears on 05/06/2018 16:16:52 Mcclees, [name] ([id number]) -------------------------------------------------------------------------------- Lower Extremity Assessment Details Patient Name: ANURAG, SCARFO. Date of Service: 05/06/2018 2:45 PM Medical Record Number: [id number] Patient Account Number: [id number] Date of Birth/Sex: 1940/12/14 (78 y.o. M) Treating RN: Harold Barban Primary Care Kareem Aul: Fulton Reek Other Clinician: Referring Gailyn Crook: Fulton Reek Treating Kayren Holck/Extender: Melburn Hake, HOYT Weeks in Treatment: 11 Edema Assessment Assessed: [Left: No] [Right: No] [Left: Edema] [Right: :] Calf Left: Right: Point of Measurement: 30 cm From Medial Instep 30 cm 30 cm Ankle Left: Right: Point of Measurement: 10 cm From Medial Instep 19 cm 19.3 cm Vascular Assessment Pulses: Dorsalis Pedis Palpable: [Left:No] [Right:No] Posterior Tibial Palpable: [Left:Yes] [Right:Yes] Extremity colors, hair growth, and conditions: Hair Growth on Extremity: [Left:Yes] [Right:Yes] Capillary Refill: [Left:> 3 seconds] [Right:> 3 seconds] Toe Nail Assessment Left: Right: Thick: Yes Yes Discolored: Yes Yes Deformed: Yes Yes Improper Length and Hygiene: No No Electronic Signature(s) Signed: 05/08/2018 4:21:01 PM By: Harold Barban Entered By: Harold Barban on 05/06/2018 15:20:25 Gregory Tyler ([id number]) -------------------------------------------------------------------------------- Multi Wound Chart Details Patient Name: Gregory Tyler. Date of Service: 05/06/2018  2:45 PM Medical Record Number: [id number] Patient Account Number: [id number] Date of Birth/Sex: 12-17-1940 (78 y.o. M) Treating RN: Montey Hora Primary Care Jameria Bradway: Fulton Reek Other Clinician: Referring Konstantin Lehnen: Fulton Reek Treating Marbella Markgraf/Extender: Melburn Hake, HOYT Weeks in Treatment: 11 Vital Signs Height(in): 68 Pulse(bpm): 87 Weight(lbs): 168 Blood Pressure(mmHg): 90/52 Body Mass Index(BMI): 26 Temperature(F): 97.7 Respiratory Rate 16 (breaths/min): Photos: [1:No Photos] [10:No Photos] [11:No Photos] Wound Location: [1:Left Calcaneus] [10:Abdomen - midline] [11:Right Abdomen -  Lower Quadrant] Wounding Event: [1:Pressure Injury] [10:Gradually Appeared] [11:Gradually Appeared] Primary Etiology: [1:Pressure Ulcer] [10:Abscess] [11:Abscess] Secondary Etiology: [1:Diabetic Wound/Ulcer of the Lower Extremity] [10:N/A] [11:N/A] Comorbid History: [1:Anemia, Arrhythmia, Hypertension, Type II Diabetes, End Stage Renal Disease, Received Radiation] [10:Anemia, Arrhythmia, Hypertension, Type II Diabetes, End Stage Renal Disease, Received Radiation] [11:N/A] Date Acquired: [1:12/23/2017] [10:04/21/2018] [11:04/21/2018] Weeks of Treatment: [1:11] [10:2] [11:2] Wound Status: [1:Open] [10:Open] [11:Healed - Epithelialized] Clustered Wound: [1:No] [10:No] [11:No] Clustered Quantity: [1:N/A] [10:N/A] [11:N/A] Measurements L x W x D [1:0.1x0.1x0.1] [10:0.8x0.9x0.2] [11:0x0x0] (cm) Area (cm) : [1:0.008] [10:0.565] [11:0] Volume (cm) : [1:0.001] [10:0.113] [11:0] % Reduction in Area: [1:95.90%] [10:74.30%] [11:100.00%] % Reduction in Volume: [1:95.00%] [10:87.20%] [11:100.00%] Classification: [1:Category/Stage II] [10:Full Thickness Without Exposed Support Structures] [11:Partial Thickness] Exudate Amount: [1:Small] [10:Large] [11:N/A] Exudate Type: [1:Serosanguineous] [10:Purulent] [11:N/A] Exudate Color: [1:red, brown] [10:yellow, brown, green] [11:N/A] Wound Margin: [1:Flat  and Intact] [10:Flat and Intact] [11:N/A] Granulation Amount: [1:None Present (0%)] [10:None Present (0%)] [11:N/A] Granulation Quality: [1:N/A] [10:N/A] [11:N/A] Necrotic Amount: [1:Medium (34-66%)] [10:Medium (34-66%)] [11:N/A] Necrotic Tissue: [1:Adherent Slough] [10:Adherent Slough] [11:N/A] Exposed Structures: [1:Fat Layer (Subcutaneous Tissue) Exposed: Yes Fascia: No Tendon: No Muscle: No] [10:Fat Layer (Subcutaneous Tissue) Exposed: Yes Fascia: No Tendon: No Muscle: No] [11:N/A] Joint: No Joint: No Bone: No Bone: No Epithelialization: Small (1-33%) None N/A Periwound Skin Texture: Excoriation: Yes Excoriation: No No Abnormalities Noted Induration: Yes Induration: No Callus: Yes Callus: No Crepitus: Yes Crepitus: No Rash: Yes Rash: No Scarring: Yes Scarring: No Periwound Skin Moisture: Maceration: Yes Maceration: No No Abnormalities Noted Dry/Scaly: Yes Dry/Scaly: No Periwound Skin Color: Atrophie Blanche: Yes Atrophie Blanche: No No Abnormalities Noted Cyanosis: Yes Cyanosis: No Ecchymosis: Yes Ecchymosis: No Erythema: Yes Erythema: No Hemosiderin Staining: Yes Hemosiderin Staining: No Mottled: Yes Mottled: No Pallor: Yes Pallor: No Rubor: Yes Rubor: No Erythema Location: N/A N/A N/A Temperature: No Abnormality No Abnormality N/A Tenderness on Palpation: Yes Yes No Wound Preparation: Ulcer Cleansing: Ulcer Cleansing: N/A Rinsed/Irrigated with Saline Rinsed/Irrigated with Saline Topical Anesthetic Applied: Topical Anesthetic Applied: Other: lidocaine 4% None Wound Number: [id number]  Photos: No Photos No Photos No Photos Wound Location: Left Lower Leg - Posterior Right Toe Great Right Toe Second Wounding Event: Pressure Injury Pressure Injury Gradually Appeared Primary Etiology: Diabetic Wound/Ulcer of the Diabetic Wound/Ulcer of the Diabetic Wound/Ulcer of the Lower Extremity Lower Extremity Lower Extremity Secondary Etiology: N/A N/A N/A Comorbid  History: Anemia, Arrhythmia, Anemia, Arrhythmia, Anemia, Arrhythmia, Hypertension, Type II Hypertension, Type II Hypertension, Type II Diabetes, End Stage Renal Diabetes, End Stage Renal Diabetes, End Stage Renal Disease, Received Radiation Disease, Received Radiation Disease, Received Radiation Date Acquired: 05/07/2017 01/20/2018 02/17/2018 Weeks of Treatment: 0 11 11 Wound Status: Open Open Open Clustered Wound: No No No Clustered Quantity: N/A N/A N/A Measurements L x W x D 8x2.4x0.1 1.5x1.6x0.1 0.9x0.9x0.1 (cm) Area (cm) : 15.08 1.885 0.636 Volume (cm) : 1.508 0.188 0.064 % Reduction in Area: 0.00% 73.70% -169.50% % Reduction in Volume: 0.00% 73.80% -166.70% Classification: Grade 2 Grade 2 Grade 2 Exudate Amount: Large None Present None Present Exudate Type: Serosanguineous N/A N/A Exudate Color: red, brown N/A N/A Wound Margin: Flat and Intact Indistinct, nonvisible Indistinct, nonvisible Granulation Amount: None Present (0%) None Present (0%) None Present (0%) Granulation Quality: N/A N/A N/A SHMIEL, MORTON R. ([id number]) Necrotic Amount: Large (67-100%) Large (67-100%) Large (67-100%) Necrotic Tissue: Eschar, Adherent Slough Eschar Eschar Exposed Structures: Fat Layer (Subcutaneous Fascia: No Fat Layer (Subcutaneous Tissue) Exposed: Yes Fat Layer (Subcutaneous Tissue) Exposed: Yes Tendon: Yes Tissue)  Exposed: No Fascia: No Muscle: Yes Tendon: No Tendon: No Fascia: No Muscle: No Muscle: No Joint: No Joint: No Joint: No Bone: No Bone: No Bone: No Epithelialization: None None None Periwound Skin Texture: Excoriation: No Callus: Yes Callus: Yes Induration: No Excoriation: No Excoriation: No Callus: No Induration: No Induration: No Crepitus: No Crepitus: No Crepitus: No Rash: No Rash: No Rash: No Scarring: No Scarring: No Scarring: No Periwound Skin Moisture: Dry/Scaly: Yes Maceration: No Dry/Scaly: Yes Maceration: No Dry/Scaly:  No Maceration: No Periwound Skin Color: Erythema: Yes Erythema: Yes Erythema: Yes Atrophie Blanche: No Atrophie Blanche: No Atrophie Blanche: No Cyanosis: No Cyanosis: No Cyanosis: No Ecchymosis: No Ecchymosis: No Ecchymosis: No Hemosiderin Staining: No Hemosiderin Staining: No Hemosiderin Staining: No Mottled: No Mottled: No Mottled: No Pallor: No Pallor: No Pallor: No Rubor: No Rubor: No Rubor: No Erythema Location: Circumferential Circumferential Circumferential Temperature: N/A N/A No Abnormality Tenderness on Palpation: Yes No Yes Wound Preparation: Ulcer Cleansing: Ulcer Cleansing: Ulcer Cleansing: Rinsed/Irrigated with Saline Rinsed/Irrigated with Saline Rinsed/Irrigated with Saline Topical Anesthetic Applied: Topical Anesthetic Applied: Topical Anesthetic Applied: Other: lidocaine 4% Other: lidocaine 4% Other: lidocaine 4% Wound Number: [id number]  Photos: No Photos No Photos No Photos Wound Location: Left Gluteus Sacrum - Medial Right Lower Leg - Lateral Wounding Event: Pressure Injury Gradually Appeared Not Known 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 Secondary Etiology: N/A N/A N/A Comorbid History: Anemia, Arrhythmia, Anemia, Arrhythmia, Anemia, Arrhythmia, Hypertension, Type II Hypertension, Type II Hypertension, Type II Diabetes, End Stage Renal Diabetes, End Stage Renal Diabetes, End Stage Renal Disease, Received Radiation Disease, Received Radiation Disease, Received Radiation Date Acquired: 01/20/2018 01/27/2018 03/20/2018 Weeks of Treatment: 11 11 6  Wound Status: Open Open Open Clustered Wound: Yes Yes No Clustered Quantity: N/A 2 N/A Measurements L x W x D 0x0x0 3.5x2.2x0.2 11.5x5.8x2.8 (cm) Area (cm) : 0 6.048 52.386 Volume (cm) : 0 1.21 146.681 % Reduction in Area: 100.00% -358.50% -2878.20% % Reduction in Volume: 100.00% -205.60% -83241.50% Classification: Category/Stage III Category/Stage III Category/Stage  III Exudate Amount: Medium Large Small JOHATHON, OVERTURF. ([id number]) Exudate Type: Serosanguineous Serosanguineous Purulent Exudate Color: red, brown red, brown yellow, brown, green Wound Margin: Flat and Intact Flat and Intact Thickened Granulation Amount: None Present (0%) Small (1-33%) None Present (0%) Granulation Quality: N/A Pink N/A Necrotic Amount: None Present (0%) Large (67-100%) Large (67-100%) Necrotic Tissue: N/A Eschar, Adherent Slough Eschar, Adherent Slough Exposed Structures: Fascia: No Fat Layer (Subcutaneous Fat Layer (Subcutaneous Fat Layer (Subcutaneous Tissue) Exposed: Yes Tissue) Exposed: Yes Tissue) Exposed: No Fascia: No Fascia: No Tendon: No Tendon: No Tendon: No Muscle: No Muscle: No Muscle: No Joint: No Joint: No Joint: No Bone: No Bone: No Bone: No Epithelialization: Large (67-100%) Small (1-33%) None Periwound Skin Texture: Scarring: Yes Scarring: Yes Excoriation: No Excoriation: No Excoriation: No Induration: No Induration: No Induration: No Callus: No Callus: No Callus: No Crepitus: No Crepitus: No Crepitus: No Rash: No Rash: No Rash: No Scarring: No Periwound Skin Moisture: Maceration: No Maceration: No Maceration: No Dry/Scaly: No Dry/Scaly: No Dry/Scaly: No Periwound Skin Color: Hemosiderin Staining: Yes Atrophie Blanche: No Erythema: Yes Atrophie Blanche: No Cyanosis: No Hemosiderin Staining: Yes Cyanosis: No Ecchymosis: No Atrophie Blanche: No Ecchymosis: No Erythema: No Cyanosis: No Erythema: No Hemosiderin Staining: No Ecchymosis: No Mottled: No Mottled: No Mottled: No Pallor: No Pallor: No Pallor: No Rubor: No Rubor: No Rubor: No Erythema Location: N/A N/A Circumferential Temperature: N/A N/A N/A Tenderness on Palpation: No Yes Yes Wound Preparation: Ulcer Cleansing: Ulcer  Cleansing: Ulcer Cleansing: Rinsed/Irrigated with Saline Rinsed/Irrigated with Saline Rinsed/Irrigated with  Saline Topical Anesthetic Applied: Topical Anesthetic Applied: Topical Anesthetic Applied: None None Other: lidocaine 4% Wound Number: 9 N/A N/A Photos: No Photos N/A N/A Wound Location: Right Gluteus N/A N/A Wounding Event: Pressure Injury N/A N/A Primary Etiology: Pressure Ulcer N/A N/A Secondary Etiology: N/A N/A N/A Comorbid History: Anemia, Arrhythmia, N/A N/A Hypertension, Type II Diabetes, End Stage Renal Disease, Received Radiation Date Acquired: 04/21/2018 N/A N/A Weeks of Treatment: 2 N/A N/A Wound Status: Open N/A N/A Clustered Wound: No N/A N/A Clustered Quantity: N/A N/A N/A Measurements L x W x D 0x0x0 N/A N/A (cm) Area (cm) : 0 N/A N/A Gatz, Reginold R. ([id number]) Volume (cm) : 0 N/A N/A % Reduction in Area: 100.00% N/A N/A % Reduction in Volume: 100.00% N/A N/A Classification: Category/Stage III N/A N/A Exudate Amount: None Present N/A N/A Exudate Type: N/A N/A N/A Exudate Color: N/A N/A N/A Wound Margin: Thickened N/A N/A Granulation Amount: None Present (0%) N/A N/A Granulation Quality: N/A N/A N/A Necrotic Amount: None Present (0%) N/A N/A Necrotic Tissue: N/A N/A N/A Exposed Structures: Fascia: No N/A N/A Fat Layer (Subcutaneous Tissue) Exposed: No Tendon: No Muscle: No Joint: No Bone: No Epithelialization: Large (67-100%) N/A N/A Periwound Skin Texture: No Abnormalities Noted N/A N/A Periwound Skin Moisture: No Abnormalities Noted N/A N/A Periwound Skin Color: Hemosiderin Staining: Yes N/A N/A Erythema Location: N/A N/A N/A Temperature: N/A N/A N/A Tenderness on Palpation: No N/A N/A Wound Preparation: Ulcer Cleansing: N/A N/A Rinsed/Irrigated with Saline Treatment Notes Electronic Signature(s) Signed: 05/06/2018 5:22:36 PM By: Montey Hora Entered By: Montey Hora on 05/06/2018 15:36:03 Gregory Tyler ([id number]) -------------------------------------------------------------------------------- Elizabeth Plan  Details Patient Name: TAKUMI, DIN. Date of Service: 05/06/2018 2:45 PM Medical Record Number: [id number] Patient Account Number: [id number] Date of Birth/Sex: 02/04/1941 (78 y.o. M) Treating RN: Montey Hora Primary Care Minas Bonser: Fulton Reek Other Clinician: Referring Elecia Serafin: Fulton Reek Treating Gertrue Willette/Extender: Melburn Hake, HOYT Weeks in Treatment: 11 Active Inactive Electronic Signature(s) Signed: 05/06/2018 5:22:36 PM By: Montey Hora Entered By: Montey Hora on 05/06/2018 15:35:52 [place], [name] ([id number]) -------------------------------------------------------------------------------- Pain Assessment Details Patient Name: CLAUDY, ABDALLAH. Date of Service: 05/06/2018 2:45 PM Medical Record Number: [id number] Patient Account Number: [id number] Date of Birth/Sex: 04/11/1941 (78 y.o. M) Treating RN: Montey Hora Primary Care Emanuela Runnion: Fulton Reek Other Clinician: Referring Cleda Imel: Fulton Reek Treating Jaidyn Kuhl/Extender: Melburn Hake, HOYT Weeks in Treatment: 11 Active Problems Location of Pain Severity and Description of Pain Patient Has Paino Yes Site Locations Pain Location: Pain in Ulcers With Dressing Change: Yes Duration of the Pain. Constant / Intermittento Constant Pain Management and Medication Current Pain Management: Goals for Pain Management all wouds hurt Electronic Signature(s) Signed: 05/06/2018 5:22:36 PM By: Montey Hora Entered By: Montey Hora on 05/06/2018 15:27:48 Gregory Tyler ([id number]) -------------------------------------------------------------------------------- Patient/Caregiver Education Details Patient Name: TORRANCE, STOCKLEY. Date of Service: 05/06/2018 2:45 PM Medical Record Number: [id number] Patient Account Number: [id number] Date of Birth/Gender: [birth year] (78 y.o. M) Treating RN: Montey Hora Primary Care Physician: Fulton Reek Other Clinician: Referring Physician: Fulton Reek Treating Physician/Extender: Sharalyn Ink in Treatment: 11 Education Assessment Education Provided To: Caregiver SNF nurses via written orders Education Topics Provided Wound/Skin Impairment: Handouts: Other: signed wound care orders Methods: Printed Electronic Signature(s) Signed: 05/06/2018 5:22:36 PM By: Montey Hora Entered By: Montey Hora on 05/06/2018 16:48:02 Gregory Tyler ([id number]) -------------------------------------------------------------------------------- Wound Assessment Details Patient Name: HOPE, HOLST. Date of Service: 05/06/2018 2:45 PM  Medical Record Number: [id number] Patient Account Number: [id number] Date of Birth/Sex: 17-Aug-1940 (78 y.o. 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 Wounding Event: Pressure Injury Etiology: Date Acquired: 12/23/2017 Wound Open Weeks Of Treatment: 11 Status: Clustered Wound: No Comorbid Anemia, Arrhythmia, Hypertension, Type II History: Diabetes, End Stage Renal Disease, Received Radiation Photos Photo Uploaded By: Gretta Cool, BSN, RN, CWS, Kim on 05/06/2018 16:46:09 Wound Measurements Length: (cm) 0.1 Width: (cm) 0.1 Depth: (cm) 0.1 Area: (cm) 0.008 Volume: (cm) 0.001 % Reduction in Area: 95.9% % Reduction in Volume: 95% Epithelialization: Small (1-33%) Tunneling: No Undermining: No Wound Description Classification: Category/Stage II Foul Odor Wound Margin: Flat and Intact Slough/Fi Exudate Amount: Small Exudate Type: Serosanguineous Exudate Color: red, brown After Cleansing: No brino Yes Wound Bed Granulation Amount: None Present (0%) Exposed Structure Necrotic Amount: Medium  (34-66%) Fascia Exposed: No Necrotic Quality: Adherent Slough Fat Layer (Subcutaneous Tissue) Exposed: Yes Tendon Exposed: No Muscle Exposed: No Joint Exposed: No Bone Exposed: No DEWAUN, KINZLER R. ([id number]) Periwound Skin Texture Texture Color No Abnormalities Noted: No No Abnormalities Noted: No Callus: Yes Atrophie Blanche: Yes Crepitus: Yes Cyanosis: Yes Excoriation: Yes Ecchymosis: Yes Induration: Yes Erythema: Yes Rash: Yes Hemosiderin Staining: Yes Scarring: Yes Mottled: Yes Pallor: Yes Moisture Rubor: Yes No Abnormalities Noted: No Dry / Scaly: Yes Temperature / Pain Maceration: Yes Temperature: No Abnormality Tenderness on Palpation: Yes Wound Preparation Ulcer Cleansing: Rinsed/Irrigated with Saline Topical Anesthetic Applied: Other: lidocaine 4%, Treatment Notes Wound #1 (Left Calcaneus) Notes betadine paint Electronic Signature(s) Signed: 05/08/2018 4:21:01 PM By: Harold Barban Entered By: Harold Barban on 05/06/2018 15:08:23 Gregory Tyler ([id number]) -------------------------------------------------------------------------------- Wound Assessment Details Patient Name: FOSTER, FRERICKS. Date of Service: 05/06/2018 2:45 PM Medical Record Number: [id number] Patient Account Number: [id number] Date of Birth/Sex: 11-28-1940 (78 y.o. M) Treating RN: Harold Barban Primary Care Othell Diluzio: Fulton Reek Other Clinician: Referring Jadea Shiffer: Fulton Reek Treating Millissa Deese/Extender: Melburn Hake, HOYT Weeks in Treatment: 11 Wound Status Wound Number: 10 Primary Abscess Etiology: Wound Location: Abdomen - midline Wound Open Wounding Event: Gradually Appeared Status: Date Acquired: 04/21/2018 Comorbid Anemia, Arrhythmia, Hypertension, Type II Weeks Of Treatment: 2 History: Diabetes, End Stage Renal Disease, Received Clustered Wound: No Radiation Photos Photo Uploaded By: Gretta Cool, BSN, RN, CWS, Kim on 05/06/2018 16:59:24 Wound  Measurements Length: (cm) 0.8 Width: (cm) 0.9 Depth: (cm) 0.2 Area: (cm) 0.565 Volume: (cm) 0.113 % Reduction in Area: 74.3% % Reduction in Volume: 87.2% Epithelialization: None Tunneling: No Undermining: No Wound Description Full Thickness Without Exposed Support Foul Odo Classification: Structures Slough/F Wound Margin: Flat and Intact Exudate Large Amount: Exudate Type: Purulent Exudate Color: yellow, brown, green r After Cleansing: No ibrino Yes Wound Bed Granulation Amount: None Present (0%) Exposed Structure Necrotic Amount: Medium (34-66%) Fascia Exposed: No Necrotic Quality: Adherent Slough Fat Layer (Subcutaneous Tissue) Exposed: Yes Tendon Exposed: No Muscle Exposed: No Joint Exposed: No Bone Exposed: No Whittaker, Bosten R. ([id number]) Periwound Skin Texture Texture Color No Abnormalities Noted: No No Abnormalities Noted: No Callus: No Atrophie Blanche: No Crepitus: No Cyanosis: No Excoriation: No Ecchymosis: No Induration: No Erythema: No Rash: No Hemosiderin Staining: No Scarring: No Mottled: No Pallor: No Moisture Rubor: No No Abnormalities Noted: No Dry / Scaly: No Temperature / Pain Maceration: No Temperature: No Abnormality Tenderness on Palpation: Yes Wound Preparation  Ulcer Cleansing: Rinsed/Irrigated with Saline Topical Anesthetic Applied: None Treatment Notes Wound #10 (Abdomen - midline) Notes silvercel and bordered foam dressing; SNF to restart NPWT on right lateral lower leg wound Electronic Signature(s) Signed: 05/08/2018 4:21:01 PM By: Harold Barban Entered By: Harold Barban on 05/06/2018 15:09:01 Gregory Tyler ([id number]) -------------------------------------------------------------------------------- Wound Assessment Details Patient Name: Gregory Tyler. Date of Service: 05/06/2018 2:45 PM Medical Record Number: [id number] Patient Account Number: [id number] Date of Birth/Sex: 23-Aug-1940 (78 y.o.  M) Treating RN: Montey Hora Primary Care Nena Hampe: Fulton Reek Other Clinician: Referring Kainalu Heggs: Fulton Reek Treating Onetta Spainhower/Extender: Melburn Hake, HOYT Weeks in Treatment: 11 Wound Status Wound Number: 11 Primary Etiology: Abscess Wound Location: Right Abdomen - Lower Quadrant Wound Status: Healed - Epithelialized Wounding Event: Gradually Appeared Date Acquired: 04/21/2018 Weeks Of Treatment: 2 Clustered Wound: No Wound Measurements Length: (cm) 0 Width: (cm) 0 Depth: (cm) 0 Area: (cm) 0 Volume: (cm) 0 % Reduction in Area: 100% % Reduction in Volume: 100% Wound Description Classification: Partial Thickness Periwound Skin Texture Texture Color No Abnormalities Noted: No No Abnormalities Noted: No Moisture No Abnormalities Noted: No Electronic Signature(s) Signed: 05/06/2018 5:22:36 PM By: Montey Hora Entered By: Montey Hora on 05/06/2018 15:35:02 Gregory Tyler ([id number]) -------------------------------------------------------------------------------- Wound Assessment Details Patient Name: Gregory Tyler. Date of Service: 05/06/2018 2:45 PM Medical Record Number: [id number] Patient Account Number: [id number] Date of Birth/Sex: 17-Oct-1940 (78 y.o. M) Treating RN: Harold Barban Primary Care Rudra Hobbins: Fulton Reek Other Clinician: Referring Bobbie Valletta: Fulton Reek Treating Torian Thoennes/Extender: Melburn Hake, HOYT Weeks in Treatment: 11 Wound Status Wound Number: 12 Primary Diabetic Wound/Ulcer of the Lower Extremity Etiology: Wound Location: Left Lower Leg - Posterior Wound Open Wounding Event: Pressure Injury Status: Date Acquired: 05/07/2017 Comorbid Anemia, Arrhythmia, Hypertension, Type II Weeks Of Treatment: 0 History: Diabetes, End Stage Renal Disease, Received Clustered Wound: No Radiation Photos Photo Uploaded By: Gretta Cool, BSN, RN, CWS, Kim on 05/06/2018 17:03:21 Wound Measurements Length: (cm) 8 Width: (cm) 2.4 Depth:  (cm) 0.1 Area: (cm) 15.08 Volume: (cm) 1.508 % Reduction in Area: 0% % Reduction in Volume: 0% Epithelialization: None Tunneling: No Undermining: No Wound Description Classification: Grade 2 Foul Odor Wound Margin: Flat and Intact Slough/Fi Exudate Amount: Large Exudate Type: Serosanguineous Exudate Color: red, brown After Cleansing: No brino Yes Wound Bed Granulation Amount: None Present (0%) Exposed Structure Necrotic Amount: Large (67-100%) Fascia Exposed: No Necrotic Quality: Eschar, Adherent Slough Fat Layer (Subcutaneous Tissue) Exposed: Yes Tendon Exposed: Yes Muscle Exposed: Yes Necrosis of Muscle: No Joint Exposed: No Bone Exposed: No MASYN, ROSTRO R. ([id number]) Periwound Skin Texture Texture Color No Abnormalities Noted: No No Abnormalities Noted: No Callus: No Atrophie Blanche: No Crepitus: No Cyanosis: No Excoriation: No Ecchymosis: No Induration: No Erythema: Yes Rash: No Erythema Location: Circumferential Scarring: No Hemosiderin Staining: No Mottled: No Moisture Pallor: No No Abnormalities Noted: No Rubor: No Dry / Scaly: Yes Maceration: No Temperature / Pain Tenderness on Palpation: Yes Wound Preparation Ulcer Cleansing: Rinsed/Irrigated with Saline Topical Anesthetic Applied: Other: lidocaine 4%, Treatment Notes Wound #12 (Left, Posterior Lower Leg) Notes silvercel and bordered foam dressing; SNF to restart NPWT on right lateral lower leg wound Electronic Signature(s) Signed: 05/08/2018 4:21:01 PM By: Harold Barban Entered By: Harold Barban on 05/06/2018 15:13:27 Gregory Tyler ([id number]) -------------------------------------------------------------------------------- Wound Assessment Details Patient Name: Gregory Tyler. Date of Service: 05/06/2018 2:45 PM Medical Record Number: [id number] Patient Account Number: [id number] Date of Birth/Sex: 02-18-1941 (78 y.o. M) Treating RN: Harold Barban Primary Care  Sheria Rosello: Fulton Reek Other Clinician:  Referring Yeng Frankie: Fulton Reek Treating Abagale Boulos/Extender: Melburn Hake, HOYT Weeks in Treatment: 11 Wound Status Wound Number: 3 Primary Diabetic Wound/Ulcer of the Lower Extremity Etiology: Wound Location: Right Toe Great Wound Open Wounding Event: Pressure Injury Status: Date Acquired: 01/20/2018 Comorbid Anemia, Arrhythmia, Hypertension, Type II Weeks Of Treatment: 11 History: Diabetes, End Stage Renal Disease, Received Clustered Wound: No Radiation Photos Photo Uploaded By: Gretta Cool, BSN, RN, CWS, Kim on 05/06/2018 17:03:22 Wound Measurements Length: (cm) 1.5 Width: (cm) 1.6 Depth: (cm) 0.1 Area: (cm) 1.885 Volume: (cm) 0.188 % Reduction in Area: 73.7% % Reduction in Volume: 73.8% Epithelialization: None Tunneling: No Undermining: No Wound Description Classification: Grade 2 Wound Margin: Indistinct, nonvisible Exudate Amount: None Present Foul Odor After Cleansing: No Slough/Fibrino Yes Wound Bed Granulation Amount: None Present (0%) Exposed Structure Necrotic Amount: Large (67-100%) Fascia Exposed: No Necrotic Quality: Eschar Fat Layer (Subcutaneous Tissue) Exposed: No Tendon Exposed: No Muscle Exposed: No Joint Exposed: No Bone Exposed: No Periwound Skin Texture Texture Color No Abnormalities Noted: No No Abnormalities Noted: No RAYHAAN, HUSTER R. ([id number]) Callus: Yes Atrophie Blanche: No Crepitus: No Cyanosis: No Excoriation: No Ecchymosis: No Induration: No Erythema: Yes Rash: No Erythema Location: Circumferential Scarring: No Hemosiderin Staining: No Mottled: No Moisture Pallor: No No Abnormalities Noted: No Rubor: No Dry / Scaly: No Maceration: No Wound Preparation Ulcer Cleansing: Rinsed/Irrigated with Saline Topical Anesthetic Applied: Other: lidocaine 4%, Treatment Notes Wound #3 (Right Toe Great) Notes betadine paint Electronic Signature(s) Signed: 05/08/2018 4:21:01 PM By:  Harold Barban Entered By: Harold Barban on 05/06/2018 15:14:16 Weng, [name] ([id number]) -------------------------------------------------------------------------------- Wound Assessment Details Patient Name: HEMAN, QUE. Date of Service: 05/06/2018 2:45 PM Medical Record Number: [id number] Patient Account Number: [id number] Date of Birth/Sex: [birth year] (78 y.o. M) Treating RN: Harold Barban Primary Care Tasneem Cormier: Fulton Reek Other Clinician: Referring Zayn Selley: Fulton Reek Treating Hiba Garry/Extender: Melburn Hake, HOYT Weeks in Treatment: 11 Wound Status Wound Number: 4 Primary Diabetic Wound/Ulcer of the Lower Extremity Etiology: Wound Location: Right Toe Second Wound Open Wounding Event: Gradually Appeared Status: Date Acquired: 02/17/2018 Comorbid Anemia, Arrhythmia, Hypertension, Type II Weeks Of Treatment: 11 History: Diabetes, End Stage Renal Disease, Received Clustered Wound: No Radiation Photos Photo Uploaded By: Gretta Cool, BSN, RN, CWS, Kim on 05/06/2018 17:05:16 Wound Measurements Length: (cm) 0.9 Width: (cm) 0.9 Depth: (cm) 0.1 Area: (cm) 0.636 Volume: (cm) 0.064 % Reduction in Area: -169.5% % Reduction in Volume: -166.7% Epithelialization: None Tunneling: No Undermining: No Wound Description Classification: Grade 2 Wound Margin: Indistinct, nonvisible Exudate Amount: None Present Foul Odor After Cleansing: No Slough/Fibrino Yes Wound Bed Granulation Amount: None Present (0%) Exposed Structure Necrotic Amount: Large (67-100%) Fascia Exposed: No Necrotic Quality: Eschar Fat Layer (Subcutaneous Tissue) Exposed: Yes Tendon Exposed: No Muscle Exposed: No Joint Exposed: No Bone Exposed: No Periwound Skin Texture Texture Color No Abnormalities Noted: No No Abnormalities Noted: No NAJEE, MANNINEN R. ([id number]) Callus: Yes Atrophie Blanche: No Crepitus: No Cyanosis: No Excoriation: No Ecchymosis: No Induration:  No Erythema: Yes Rash: No Erythema Location: Circumferential Scarring: No Hemosiderin Staining: No Mottled: No Moisture Pallor: No No Abnormalities Noted: No Rubor: No Dry / Scaly: Yes Maceration: No Temperature / Pain Temperature: No Abnormality Tenderness on Palpation: Yes Wound Preparation Ulcer Cleansing: Rinsed/Irrigated with Saline Topical Anesthetic Applied: Other: lidocaine 4%, Treatment Notes Wound #4 (Right Toe Second) Notes betadine paint Electronic Signature(s) Signed: 05/08/2018 4:21:01 PM By: Harold Barban Entered By: Harold Barban on 05/06/2018 15:15:07 Gregory Tyler ([id number]) -------------------------------------------------------------------------------- Wound Assessment Details Patient Name: JAMARR, TREINEN. Date of  Service: 05/06/2018 2:45 PM Medical Record Number: [id number] Patient Account Number: [id number] Date of Birth/Sex: August 18, 1940 (77 y.o. M) Treating RN: Harold Barban Primary Care Ayala Ribble: Fulton Reek Other Clinician: Referring Hortense Cantrall: Fulton Reek Treating Kewanna Kasprzak/Extender: Melburn Hake, HOYT Weeks in Treatment: 11 Wound Status Wound Number: 5 Primary Pressure Ulcer Etiology: Wound Location: Left Gluteus Wound Open Wounding Event: Pressure Injury Status: Date Acquired: 01/20/2018 Comorbid Anemia, Arrhythmia, Hypertension, Type II Weeks Of Treatment: 11 History: Diabetes, End Stage Renal Disease, Received Clustered Wound: Yes Radiation Wound Measurements Length: (cm) 0 % Reduc Width: (cm) 0 % Reduc Depth: (cm) 0 Epithel Area: (cm) 0 Tunnel Volume: (cm) 0 Underm tion in Area: 100% tion in Volume: 100% ialization: Large (67-100%) ing: No ining: No Wound Description Classification: Category/Stage III Foul Od Wound Margin: Flat and Intact Slough/ Exudate Amount: Medium Exudate Type: Serosanguineous Exudate Color: red, brown or After Cleansing: No Fibrino No Wound Bed Granulation Amount: None  Present (0%) Exposed Structure Necrotic Amount: None Present (0%) Fascia Exposed: No Fat Layer (Subcutaneous Tissue) Exposed: No Tendon Exposed: No Muscle Exposed: No Joint Exposed: No Bone Exposed: No Periwound Skin Texture Texture Color No Abnormalities Noted: No No Abnormalities Noted: No Callus: No Atrophie Blanche: No Crepitus: No Cyanosis: No Excoriation: No Ecchymosis: No Induration: No Erythema: No Rash: No Hemosiderin Staining: Yes Scarring: Yes Mottled: No Pallor: No Moisture Rubor: No No Abnormalities Noted: No Dry / Scaly: No Maceration: No OLA, FAWVER R. ([id number]) Wound Preparation Ulcer Cleansing: Rinsed/Irrigated with Saline Topical Anesthetic Applied: None Electronic Signature(s) Signed: 05/08/2018 4:21:01 PM By: Harold Barban Entered By: Harold Barban on 05/06/2018 15:15:41 Gregory Tyler ([id number]) -------------------------------------------------------------------------------- Wound Assessment Details Patient Name: Gregory Tyler. Date of Service: 05/06/2018 2:45 PM Medical Record Number: [id number] Patient Account Number: [id number] Date of Birth/Sex: 05-Jun-1940 (78 y.o. M) Treating RN: Harold Barban Primary Care Chairty Toman: Fulton Reek Other Clinician: Referring Callyn Severtson: Fulton Reek Treating Dalyah Pla/Extender: Melburn Hake, HOYT Weeks in Treatment: 11 Wound Status Wound Number: 6 Primary Pressure Ulcer Etiology: Wound Location: Sacrum - Medial Wound Open Wounding Event: Gradually Appeared Status: Date Acquired: 01/27/2018 Comorbid Anemia, Arrhythmia, Hypertension, Type II Weeks Of Treatment: 11 History: Diabetes, End Stage Renal Disease, Received Clustered Wound: Yes Radiation Photos Photo Uploaded By: Gretta Cool, BSN, RN, CWS, Kim on 05/06/2018 17:05:17 Wound Measurements Length: (cm) 3.5 Width: (cm) 2.2 Depth: (cm) 0.2 Clustered Quantity: 2 Area: (cm) 6.048 Volume: (cm) 1.21 % Reduction in Area:  -358.5% % Reduction in Volume: -205.6% Epithelialization: Small (1-33%) Tunneling: No Undermining: No Wound Description Classification: Category/Stage III Foul Odor Wound Margin: Flat and Intact Slough/Fi Exudate Amount: Large Exudate Type: Serosanguineous Exudate Color: red, brown After Cleansing: No brino Yes Wound Bed Granulation Amount: Small (1-33%) Exposed Structure Granulation Quality: Pink Fascia Exposed: No Necrotic Amount: Large (67-100%) Fat Layer (Subcutaneous Tissue) Exposed: Yes Necrotic Quality: Eschar, Adherent Slough Tendon Exposed: No Muscle Exposed: No Joint Exposed: No Bone Exposed: No HRIDAY, STAI R. ([id number]) Periwound Skin Texture Texture Color No Abnormalities Noted: No No Abnormalities Noted: No Callus: No Atrophie Blanche: No Crepitus: No Cyanosis: No Excoriation: No Ecchymosis: No Induration: No Erythema: No Rash: No Hemosiderin Staining: No Scarring: Yes Mottled: No Pallor: No Moisture Rubor: No No Abnormalities Noted: No Dry / Scaly: No Temperature / Pain Maceration: No Tenderness on Palpation: Yes Wound Preparation Ulcer Cleansing: Rinsed/Irrigated with Saline Topical Anesthetic Applied: None Treatment Notes Wound #6 (Medial Sacrum) Notes silvercel and bordered foam dressing; SNF to restart NPWT on right lateral lower leg wound Electronic Signature(s) Signed: 05/08/2018  4:21:01 PM By: Harold Barban Entered By: Harold Barban on 05/06/2018 15:16:15 Gregory Tyler ([id number]) -------------------------------------------------------------------------------- Wound Assessment Details Patient Name: DJ, SENTENO. Date of Service: 05/06/2018 2:45 PM Medical Record Number: [id number] Patient Account Number: [id number] Date of Birth/Sex: 09-10-1940 (78 y.o. M) Treating RN: Harold Barban Primary Care Dail Lerew: Fulton Reek Other Clinician: Referring Darlin Stenseth: Fulton Reek Treating Shamonique Battiste/Extender: Melburn Hake, HOYT Weeks in Treatment: 11 Wound Status Wound Number: 8 Primary Pressure Ulcer Etiology: Wound Location: Right Lower Leg - Lateral Wound Open Wounding Event: Not Known Status: Date Acquired: 03/20/2018 Comorbid Anemia, Arrhythmia, Hypertension, Type II Weeks Of Treatment: 6 History: Diabetes, End Stage Renal Disease, Received Clustered Wound: No Radiation Photos Photo Uploaded By: Gretta Cool, BSN, RN, CWS, Kim on 05/06/2018 17:05:46 Wound Measurements Length: (cm) 11.5 Width: (cm) 5.8 Depth: (cm) 2.8 Area: (cm) 52.386 Volume: (cm) 146.681 % Reduction in Area: -2878.2% % Reduction in Volume: -83241.5% Epithelialization: None Tunneling: No Undermining: No Wound Description Classification: Category/Stage III Foul Odor Wound Margin: Thickened Slough/Fi Exudate Amount: Small Exudate Type: Purulent Exudate Color: yellow, brown, green After Cleansing: No brino Yes Wound Bed Granulation Amount: None Present (0%) Exposed Structure Necrotic Amount: Large (67-100%) Fascia Exposed: No Necrotic Quality: Eschar, Adherent Slough Fat Layer (Subcutaneous Tissue) Exposed: Yes Tendon Exposed: No Muscle Exposed: No Joint Exposed: No Bone Exposed: No Periwound Skin Texture Wargo, Jereld R. ([id number]) Texture Color No Abnormalities Noted: No No Abnormalities Noted: No Callus: No Atrophie Blanche: No Crepitus: No Cyanosis: No Excoriation: No Ecchymosis: No Induration: No Erythema: Yes Rash: No Erythema Location: Circumferential Scarring: No Hemosiderin Staining: Yes Mottled: No Moisture Pallor: No No Abnormalities Noted: No Rubor: No Dry / Scaly: No Maceration: No Temperature / Pain Tenderness on Palpation: Yes Wound Preparation Ulcer Cleansing: Rinsed/Irrigated with Saline Topical Anesthetic Applied: Other: lidocaine 4%, Treatment Notes Wound #8 (Right, Lateral Lower Leg) Notes silvercel and bordered foam dressing; SNF to restart NPWT on right lateral  lower leg wound Electronic Signature(s) Signed: 05/08/2018 4:21:01 PM By: Harold Barban Entered By: Harold Barban on 05/06/2018 15:16:45 Gregory Tyler ([id number]) -------------------------------------------------------------------------------- Wound Assessment Details Patient Name: Gregory Tyler. Date of Service: 05/06/2018 2:45 PM Medical Record Number: [id number] Patient Account Number: [id number] Date of Birth/Sex: 26-Feb-1941 (78 y.o. M) Treating RN: Harold Barban Primary Care Thurston Brendlinger: Fulton Reek Other Clinician: Referring Zaelyn Barbary: Fulton Reek Treating Roshan Salamon/Extender: Melburn Hake, HOYT Weeks in Treatment: 11 Wound Status Wound Number: 9 Primary Pressure Ulcer Etiology: Wound Location: Right Gluteus Wound Open Wounding Event: Pressure Injury Status: Date Acquired: 04/21/2018 Comorbid Anemia, Arrhythmia, Hypertension, Type II Weeks Of Treatment: 2 History: Diabetes, End Stage Renal Disease, Received Clustered Wound: No Radiation Wound Measurements Length: (cm) 0 Width: (cm) 0 Depth: (cm) 0 Area: (cm) 0 Volume: (cm) 0 % Reduction in Area: 100% % Reduction in Volume: 100% Epithelialization: Large (67-100%) Tunneling: No Undermining: No Wound Description Classification: Category/Stage III Wound Margin: Thickened Exudate Amount: None Present Foul Odor After Cleansing: No Slough/Fibrino No Wound Bed Granulation Amount: None Present (0%) Exposed Structure Necrotic Amount: None Present (0%) Fascia Exposed: No Fat Layer (Subcutaneous Tissue) Exposed: No Tendon Exposed: No Muscle Exposed: No Joint Exposed: No Bone Exposed: No Periwound Skin Texture Texture Color No Abnormalities Noted: No No Abnormalities Noted: No Hemosiderin Staining: Yes Moisture No Abnormalities Noted: No Wound Preparation Ulcer Cleansing: Rinsed/Irrigated with Saline Electronic Signature(s) Signed: 05/08/2018 4:21:01 PM By: Harold Barban Entered By: Harold Barban on 05/06/2018 15:17:21 Mounce, [name] ([id number]) -------------------------------------------------------------------------------- Fonda Details Patient Name: Gregory Tyler. Date of  Service: 05/06/2018 2:45 PM Medical Record Number: [id number] Patient Account Number: [id number] Date of Birth/Sex: 1940-06-26 (77 y.o. M) Treating RN: Harold Barban Primary Care Nariya Neumeyer: Fulton Reek Other Clinician: Referring Azya Barbero: Fulton Reek Treating Juliana Boling/Extender: Melburn Hake, HOYT Weeks in Treatment: 11 Vital Signs Time Taken: 15:30 Temperature (F): 97.7 Height (in): 68 Pulse (bpm): 87 Weight (lbs): 168 Respiratory Rate (breaths/min): 16 Body Mass Index (BMI): 25.5 Blood Pressure (mmHg): 90/52 Reference Range: 80 - 120 mg / dl Electronic Signature(s) Signed: 05/08/2018 4:21:01 PM By: Harold Barban Entered By: Harold Barban on 05/06/2018 15:30:05

## 2018-05-09 NOTE — Progress Notes (Signed)
Gregory Tyler ([id number]) Visit Report for 05/06/2018 Chief Complaint Document Details Patient Name: Gregory Tyler, [name]. Date of Service: 05/06/2018 2:45 PM Medical Record Number: [id number] Patient Account Number: [id number] Date of Birth/Sex: 12/08/1940 (78 y.o. M) Treating RN: Harold Barban Primary Care Provider: Fulton Reek Other Clinician: Referring Provider: Fulton Reek Treating Provider/Extender: Melburn Hake, HOYT Weeks in Treatment: 11 Information Obtained from: Patient Chief Complaint Multiple pressure ulcers of the bilateral LEs and gluteal/sacral region Electronic Signature(s) Signed: 05/06/2018 5:17:02 PM By: Worthy Keeler PA-C Entered By: Worthy Keeler on 05/06/2018 16:50:12 Gregory Tyler, Gregory Tyler ([id number]) -------------------------------------------------------------------------------- HPI Details Patient Name: Gregory Tyler. Date of Service: 05/06/2018 2:45 PM Medical Record Number: [id number] Patient Account Number: [id number] Date of Birth/Sex: Aug 23, 1940 (78 y.o. M) Treating RN: Harold Barban Primary Care Provider: Fulton Reek Other Clinician: Referring Provider: Fulton Reek Treating Provider/Extender: Melburn Hake, HOYT Weeks in Treatment: 11 History of Present Illness HPI Description: 02/18/18 patient presents today for initial evaluation concerning multiple ulcers that he has over the bilateral lower extremities as well as the sacral and left gluteal region. 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 Prior to my valuation today the patient was seen by Dr. Vickki Muff in the last office visit note I have is from 01/02/18. At that point sharp debridement was performed of the wound sites. [facility] He has also been a patient of Dr. dew and has had recent angiograms of the bilateral lower extremities. Patient had injured Hague of the right lower extremity which was performed on June 01/23/18. The left lower extremity procedure was performed on 01/30/18. Patient tolerated the procedure as well and according to Dr. Bunnie Domino notes which were reviewed it appears that blood flow was improved to what degree it could be in regard to the bilateral lower extremities. The patient actually did seem to have decent blood flow today. To complement this it also seems that since the procedure the patient's wounds have been improving according to what he tells me today and in fact upon visual inspection his wounds do seem to be better. Currently there are a couple areas there are going to require some debridement but fortunately nothing too significant. No fevers, chills, nausea, or vomiting noted at this time. 03/24/18 on evaluation today patient actually appears to be doing better in some regards regarding some of his wounds. He has been tolerating the dressing changes. It's actually been a little over a month since I have last seen him. He is a resident again at [facility]. 04/07/18 upon evaluation today patient's wounds of actually dramatically worsened compared to my evaluation with him two weeks ago. Specifically his right lateral lower extremity ulcer appears to be significantly affected there does not appear to be any signs of overall worsening in regard to some of his other she'll ulcers although he does have several deep tissue injuries unfortunately. In general I am concerned about the possibility of the patient becoming septic especially considering the fact that he is in it your fibrillation at this point with heart rate running between 110 to 120 and last time he was here the heart rate was around 60. He tells me that he is been  somewhat short of breath he is able to talk but does stop to  take a breath on occasion. He states he's been trying to tell people what was going on with him although he tells me "they haven't paid attention" 04/21/18 Seen today for follow up and management of gluteal and multiple bilateral lower extremity wounds. There has not been any improvement to wounds. Per Mr. Grandville Silos, he states that his facility has been short staff and they have not been able to help him much. Right lower leg is concerning today as it has more than doubled in size and bleeding with surrounding erythema. No palpable pulses of LE extremities and cold to touch. Two new wounds to his pelvis/groin area. He states that he was told that he had a boil. Both pelvis wounds with purulent drainage. Bilateral heel DTI with adherent slough. Gluteal wound with necrotic tissue and slough. He is rating pain 7-8 out of 10. He is not in any acute distress at this time. He will be transferred to hospital via EMS for further evaluation of right leg wound to r/o osteomyelitis v/s sepsis. 05/06/18 on evaluation today patient appears for follow-up concerning his multiple ulcers over his abdomen, sacral region, and bilateral lower extremities. Unfortunately these are still significant ulcers especially the right lateral lower extremity ulcer. With that being said he is on a Wound VAC in this regard which she's had since discharged from the hospital. Subsequently other than this most of the wounds appear to be doing better although he still is not wearing the proper one offloading boots according to what he tells me. At least not on a regular basis. It was also noted that the dressing on his abdomen had not been changed since 04/28/18 which again was eight days ago. For silver alginate dressing of the abdomen this is much too long. That was the date on the dressing change when the patient arrived in our clinic today. Electronic Signature(s) Signed:  05/06/2018 5:17:02 PM By: Orpah Melter, [place] ([id number]) Entered By: Worthy Keeler on 05/06/2018 16:50:55 Gregory Tyler, Gregory Tyler ([id number]) -------------------------------------------------------------------------------- Physical Exam Details Patient Name: Gregory Tyler, PATRIARCA. Date of Service: 05/06/2018 2:45 PM Medical Record Number: [id number] Patient Account Number: [id number] Date of Birth/Sex: 10-11-1940 (78 y.o. M) Treating RN: Harold Barban Primary Care Provider: Fulton Reek Other Clinician: Referring Provider: Fulton Reek Treating Provider/Extender: Melburn Hake, HOYT Weeks in Treatment: 11 Constitutional Chronically ill appearing but in no apparent acute distress. Respiratory normal breathing without difficulty. clear to auscultation bilaterally. Cardiovascular regular rate and rhythm with normal S1, S2. Psychiatric this patient is able to make decisions and demonstrates good insight into disease process. Alert and Oriented x 3. pleasant and cooperative. Notes On evaluation today patient's wounds actually appear to be doing better than when I previously seen him. He actually did request transportation to the hospital for further evaluation however he does not appear to have any signs of overt infection like a scene the last two times that I've seen him here in the clinic that warrant immediate transport to the hospital. Nonetheless I do believe that he still does have significant wounds I do believe that he does need appropriate care including the dressings being changed on a appropriately regular basis. This was discussed with the patient. Electronic Signature(s) Signed: 05/06/2018 5:17:02 PM By: Worthy Keeler PA-C Entered By: Worthy Keeler on 05/06/2018 16:51:41 Head, Gregory Tyler ([id number]) -------------------------------------------------------------------------------- Physician Orders Details Patient Name: Gregory Tyler, [place]. Date of  Service: 05/06/2018 2:45 PM Medical Record Number: [id number] Patient Account Number: [id number] Date of  Birth/Sex: 06-04-1940 (78 y.o. M) Treating RN: Montey Hora Primary Care Provider: Fulton Reek Other Clinician: Referring Provider: Fulton Reek Treating Provider/Extender: Melburn Hake, HOYT Weeks in Treatment: 60 Verbal / Phone Orders: No Diagnosis Coding Wound Cleansing Wound #1 Left Calcaneus o Clean wound with Normal Saline. o Cleanse wound with mild soap and water Wound #10 Abdomen - midline o Clean wound with Normal Saline. o Cleanse wound with mild soap and water Wound #12 Left,Posterior Lower Leg o Clean wound with Normal Saline. o Cleanse wound with mild soap and water Wound #4 Right Toe Second o Clean wound with Normal Saline. o Cleanse wound with mild soap and water Wound #6 Medial Sacrum o Clean wound with Normal Saline. o Cleanse wound with mild soap and water Wound #8 Right,Lateral Lower Leg o Clean wound with Normal Saline. o Cleanse wound with mild soap and water Primary Wound Dressing Wound #1 Left Calcaneus o Other: - Betadine Wound #3 Right Toe Great o Other: - Betadine Wound #4 Right Toe Second o Other: - Betadine Wound #10 Abdomen - midline o Silver Alginate Wound #12 Left,Posterior Lower Leg o Silver Alginate Wound #6 Medial Sacrum o Silver Alginate Wound #8 Right,Lateral Lower Leg Gregory Tyler, Gregory Tyler ([id number]) o Silver Alginate - in clinic only Secondary Dressing Wound #1 Left Calcaneus o Dry Gauze - on both heels Wound #10 Abdomen - midline o Boardered Foam Dressing Wound #12 Left,Posterior Lower Leg o Boardered Foam Dressing Wound #6 Medial Sacrum o Boardered Foam Dressing Wound #8 Right,Lateral Lower Leg o Boardered Foam Dressing - in clinic only Dressing Change Frequency Wound #1 Left Calcaneus o Change dressing every day. Wound #10 Abdomen - midline o Change dressing  every day. Wound #12 Left,Posterior Lower Leg o Change dressing every day. Wound #3 Right Toe Great o Change dressing every day. Wound #4 Right Toe Second o Change dressing every day. Wound #6 Medial Sacrum o Change dressing every day. Wound #8 Right,Lateral Lower Leg o Change Dressing Monday, Wednesday, Friday - and as needed Follow-up Appointments Wound #1 Left Calcaneus o Return Appointment in 2 weeks. Wound #10 Abdomen - midline o Return Appointment in 2 weeks. Wound #12 Left,Posterior Lower Leg o Return Appointment in 2 weeks. Wound #3 Right Toe Great o Return Appointment in 2 weeks. Wound #4 Right Toe Second o Return Appointment in 2 weeks. Gregory Tyler, Gregory Tyler ([id number]) Wound #6 Medial Sacrum o Return Appointment in 2 weeks. Wound #8 Right,Lateral Lower Leg o Return Appointment in 2 weeks. Off-Loading Wound #1 Left Calcaneus o Other: - Needs to wear Prevalon boots at all times while in bed. Additional Orders / Instructions o Vitamin A; Vitamin C, Zinc - Please add Decubivite to patient's medications to aid in wound healing o Increase protein intake. Negative Pressure Wound Therapy Wound #8 Right,Lateral Lower Leg o Wound VAC settings at 125/130 mmHg continuous pressure. Use BLACK/GREEN foam to wound cavity. Use WHITE foam to fill any tunnel/s and/or undermining. Change VAC dressing 3 X WEEK. Change canister as indicated when full. Nurse may titrate settings and frequency of dressing changes as clinically indicated. - SNF to continue NPWT Electronic Signature(s) Signed: 05/06/2018 5:17:02 PM By: Worthy Keeler PA-C Signed: 05/06/2018 5:22:36 PM By: Montey Hora Entered By: Montey Hora on 05/06/2018 15:42:42 Mondesir, Gregory Tyler ([id number]) -------------------------------------------------------------------------------- Problem List Details Patient Name: JWAN, HORNBAKER. Date of Service: 05/06/2018 2:45 PM Medical Record  Number: [id number] Patient Account Number: [id number] Date of Birth/Sex: 1940-12-25 (78 y.o. M) Treating RN: Harold Barban Primary  Care Provider: Fulton Reek Other Clinician: Referring Provider: Fulton Reek Treating Provider/Extender: Melburn Hake, HOYT Weeks in Treatment: 11 Active Problems ICD-10 Evaluated Encounter Code Description Active Date Today Diagnosis L89.622 Pressure ulcer of left heel, stage 2 02/19/2018 No Yes L89.610 Pressure ulcer of right heel, unstageable 02/19/2018 No Yes E11.621 Type 2 diabetes mellitus with foot ulcer 02/19/2018 No Yes L97.512 Non-pressure chronic ulcer of other part of right foot with fat 02/19/2018 No Yes layer exposed L89.323 Pressure ulcer of left buttock, stage 3 02/19/2018 No Yes L89.153 Pressure ulcer of sacral region, stage 3 02/19/2018 No Yes I73.89 Other specified peripheral vascular diseases 02/19/2018 No Yes Inactive Problems Resolved Problems Electronic Signature(s) Signed: 05/06/2018 5:17:02 PM By: Worthy Keeler PA-C Entered By: Worthy Keeler on 05/06/2018 16:50:06 Gregory Tyler, Gregory Tyler ([id number]) -------------------------------------------------------------------------------- Progress Note Details Patient Name: Gregory Tyler. Date of Service: 05/06/2018 2:45 PM Medical Record Number: [id number] Patient Account Number: [id number] Date of Birth/Sex: November 22, 1940 (78 y.o. M) Treating RN: Harold Barban Primary Care Provider: Fulton Reek Other Clinician: Referring Provider: Fulton Reek Treating Provider/Extender: Melburn Hake, HOYT Weeks in Treatment: 11 Subjective Chief Complaint Information obtained from Patient Multiple pressure ulcers of the bilateral LEs and gluteal/sacral region History of Present Illness (HPI) 02/18/18 patient presents today for initial evaluation concerning multiple ulcers that he has over the bilateral lower extremities as well as the sacral and left gluteal region. 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 Prior to my valuation today the patient was seen by Dr. Vickki Muff in the last office visit note I have is from 01/02/18. At that point sharp debridement was performed of the wound sites. [facility] He has also been a patient of Dr. dew and has had recent angiograms of the bilateral lower extremities. Patient had injured Hoagland of the right lower extremity which was performed on June 01/23/18. The left lower extremity procedure was performed on 01/30/18. Patient tolerated the procedure as well and according to Dr. Bunnie Domino notes which were reviewed it appears that blood flow was improved to what degree it could be in regard to the bilateral lower extremities. The patient actually did seem to have decent blood flow today. To complement this it also seems that since the procedure the patient's wounds have been improving according to what he tells me today and in fact upon visual inspection his wounds do seem to be better. Currently there are a couple areas there are going to require some debridement but fortunately nothing too significant. No fevers, chills, nausea, or vomiting noted at this time. 03/24/18 on evaluation today patient actually appears to be doing better in some regards regarding some of his wounds. He has been tolerating the dressing changes. It's actually been a little over a month since I have last seen him. He is a resident again at [facility]. 04/07/18 upon evaluation today patient's wounds of actually dramatically worsened compared to my evaluation with him two weeks ago. Specifically his right lateral lower extremity ulcer appears to be significantly  affected there does not appear to be any signs of overall worsening in regard to some of his other she'll ulcers although he does have several deep tissue injuries unfortunately. In general I am concerned about the possibility of the patient becoming septic especially  considering the fact that he is in it your fibrillation at this point with heart rate running between 110 to 120 and last time he was here the heart rate was around 60. He tells me that he is been somewhat short of breath he is able to talk but does stop to take a breath on occasion. He states he's been trying to tell people what was going on with him although he tells me "they haven't paid attention" 04/21/18 Seen today for follow up and management of gluteal and multiple bilateral lower extremity wounds. There has not been any improvement to wounds. Per Mr. Grandville Silos, he states that his facility has been short staff and they have not been able to help him much. Right lower leg is concerning today as it has more than doubled in size and bleeding with surrounding erythema. No palpable pulses of LE extremities and cold to touch. Two new wounds to his pelvis/groin area. He states that he was told that he had a boil. Both pelvis wounds with purulent drainage. Bilateral heel DTI with adherent slough. Gluteal wound with necrotic tissue and slough. He is rating pain 7-8 out of 10. He is not in any acute distress at this time. He will be transferred to hospital via EMS for further evaluation of right leg wound to r/o osteomyelitis v/s sepsis. 05/06/18 on evaluation today patient appears for follow-up concerning his multiple ulcers over his abdomen, sacral region, and bilateral lower extremities. Unfortunately these are still significant ulcers especially the right lateral lower extremity ulcer. With that being said he is on a Wound VAC in this regard which she's had since discharged from the hospital. Subsequently other than this most of the  wounds appear to be doing better although he still is not wearing the proper one offloading boots according to what he tells me. At least not on a regular basis. It was also noted that the dressing on his abdomen had not been changed since 04/28/18 which again was eight days ago. For silver alginate dressing of the abdomen this is much too YVETTE, ROARK R. ([id number]) long. That was the date on the dressing change when the patient arrived in our clinic today. Patient History Information obtained from Patient. Family History Heart Disease - Mother, Hypertension, No family history of Cancer, Diabetes, Kidney Disease, Lung Disease, Seizures, Stroke, Thyroid Problems, Tuberculosis. Social History Former smoker, Marital Status - Married, Alcohol Use - Never, Drug Use - No History, Caffeine Use - Daily. Review of Systems (ROS) Constitutional Symptoms (General Health) Denies complaints or symptoms of Fever, Chills. Respiratory The patient has no complaints or symptoms. Cardiovascular The patient has no complaints or symptoms. Psychiatric The patient has no complaints or symptoms. Objective Constitutional Chronically ill appearing but in no apparent acute distress. Vitals Time Taken: 3:30 PM, Height: 68 in, Weight: 168 lbs, BMI: 25.5, Temperature: 97.7 F, Pulse: 87 bpm, Respiratory Rate: 16 breaths/min, Blood Pressure: 90/52 mmHg. Respiratory normal breathing without difficulty. clear to auscultation bilaterally. Cardiovascular regular rate and rhythm with normal S1, S2. Psychiatric this patient is able to make decisions and demonstrates good insight into disease process. Alert and Oriented x 3. pleasant and cooperative. General Notes: On evaluation today patient's wounds actually appear to be doing better than when I previously seen him. He actually did request transportation to the hospital for further evaluation however he does not appear to have any signs of overt infection like a  scene the last two times that I've seen him here in  the clinic that warrant immediate transport to the hospital. Nonetheless I do believe that he still does have significant wounds I do believe that he does need appropriate care including the dressings being changed on a appropriately regular basis. This was discussed with the patient. Integumentary (Hair, Skin) Wound #1 status is Open. Original cause of wound was Pressure Injury. The wound is located on the Left Calcaneus. The MYRLE, DUES ([id number]) wound measures 0.1cm length x 0.1cm width x 0.1cm depth; 0.008cm^2 area and 0.001cm^3 volume. There is Fat Layer (Subcutaneous Tissue) Exposed exposed. There is no tunneling or undermining noted. There is a small amount of serosanguineous drainage noted. The wound margin is flat and intact. There is no granulation within the wound bed. There is a medium (34-66%) amount of necrotic tissue within the wound bed including Adherent Slough. The periwound skin appearance exhibited: Callus, Crepitus, Excoriation, Induration, Rash, Scarring, Dry/Scaly, Maceration, Atrophie Blanche, Cyanosis, Ecchymosis, Hemosiderin Staining, Mottled, Pallor, Rubor, Erythema. The surrounding wound skin color is noted with erythema. Periwound temperature was noted as No Abnormality. The periwound has tenderness on palpation. Wound #10 status is Open. Original cause of wound was Gradually Appeared. The wound is located on the Abdomen - midline. The wound measures 0.8cm length x 0.9cm width x 0.2cm depth; 0.565cm^2 area and 0.113cm^3 volume. There is Fat Layer (Subcutaneous Tissue) Exposed exposed. There is no tunneling or undermining noted. There is a large amount of purulent drainage noted. The wound margin is flat and intact. There is no granulation within the wound bed. There is a medium (34-66%) amount of necrotic tissue within the wound bed including Adherent Slough. The periwound skin appearance did not exhibit:  Callus, Crepitus, Excoriation, Induration, Rash, Scarring, Dry/Scaly, Maceration, Atrophie Blanche, Cyanosis, Ecchymosis, Hemosiderin Staining, Mottled, Pallor, Rubor, Erythema. Periwound temperature was noted as No Abnormality. The periwound has tenderness on palpation. Wound #11 status is Healed - Epithelialized. Original cause of wound was Gradually Appeared. The wound is located on the Right Abdomen - Lower Quadrant. The wound measures 0cm length x 0cm width x 0cm depth; 0cm^2 area and 0cm^3 volume. Wound #12 status is Open. Original cause of wound was Pressure Injury. The wound is located on the Left,Posterior Lower Leg. The wound measures 8cm length x 2.4cm width x 0.1cm depth; 15.08cm^2 area and 1.508cm^3 volume. There is muscle, tendon, and Fat Layer (Subcutaneous Tissue) Exposed exposed. There is no tunneling or undermining noted. There is a large amount of serosanguineous drainage noted. The wound margin is flat and intact. There is no granulation within the wound bed. There is a large (67-100%) amount of necrotic tissue within the wound bed including Eschar and Adherent Slough. The periwound skin appearance exhibited: Dry/Scaly, Erythema. The periwound skin appearance did not exhibit: Callus, Crepitus, Excoriation, Induration, Rash, Scarring, Maceration, Atrophie Blanche, Cyanosis, Ecchymosis, Hemosiderin Staining, Mottled, Pallor, Rubor. The surrounding wound skin color is noted with erythema which is circumferential. The periwound has tenderness on palpation. Wound #3 status is Open. Original cause of wound was Pressure Injury. The wound is located on the Right Toe Great. The wound measures 1.5cm length x 1.6cm width x 0.1cm depth; 1.885cm^2 area and 0.188cm^3 volume. There is no tunneling or undermining noted. There is a none present amount of drainage noted. The wound margin is indistinct and nonvisible. There is no granulation within the wound bed. There is a large (67-100%) amount  of necrotic tissue within the wound bed including Eschar. The periwound skin appearance exhibited: Callus, Erythema. The periwound skin appearance  did not exhibit: Crepitus, Excoriation, Induration, Rash, Scarring, Dry/Scaly, Maceration, Atrophie Blanche, Cyanosis, Ecchymosis, Hemosiderin Staining, Mottled, Pallor, Rubor. The surrounding wound skin color is noted with erythema which is circumferential. Wound #4 status is Open. Original cause of wound was Gradually Appeared. The wound is located on the Right Toe Second. The wound measures 0.9cm length x 0.9cm width x 0.1cm depth; 0.636cm^2 area and 0.064cm^3 volume. There is Fat Layer (Subcutaneous Tissue) Exposed exposed. There is no tunneling or undermining noted. There is a none present amount of drainage noted. The wound margin is indistinct and nonvisible. There is no granulation within the wound bed. There is a large (67-100%) amount of necrotic tissue within the wound bed including Eschar. The periwound skin appearance exhibited: Callus, Dry/Scaly, Erythema. The periwound skin appearance did not exhibit: Crepitus, Excoriation, Induration, Rash, Scarring, Maceration, Atrophie Blanche, Cyanosis, Ecchymosis, Hemosiderin Staining, Mottled, Pallor, Rubor. The surrounding wound skin color is noted with erythema which is circumferential. Periwound temperature was noted as No Abnormality. The periwound has tenderness on palpation. Wound #5 status is Open. Original cause of wound was Pressure Injury. The wound is located on the Left Gluteus. The wound measures 0cm length x 0cm width x 0cm depth; 0cm^2 area and 0cm^3 volume. There is no tunneling or undermining noted. There is a medium amount of serosanguineous drainage noted. The wound margin is flat and intact. There is no granulation within the wound bed. There is no necrotic tissue within the wound bed. The periwound skin appearance exhibited: Scarring, Hemosiderin Staining. The periwound skin  appearance did not exhibit: Callus, Crepitus, Excoriation, Induration, Rash, Dry/Scaly, Maceration, Atrophie Blanche, Cyanosis, Ecchymosis, Mottled, Pallor, Rubor, Erythema. Wound #6 status is Open. Original cause of wound was Gradually Appeared. The wound is located on the Medial Sacrum. The wound measures 3.5cm length x 2.2cm width x 0.2cm depth; 6.048cm^2 area and 1.21cm^3 volume. There is Fat Layer (Subcutaneous Tissue) Exposed exposed. There is no tunneling or undermining noted. There is a large amount of Gregory Tyler, Gregory R. ([id number]) serosanguineous drainage noted. The wound margin is flat and intact. There is small (1-33%) pink granulation within the wound bed. There is a large (67-100%) amount of necrotic tissue within the wound bed including Eschar and Adherent Slough. The periwound skin appearance exhibited: Scarring. The periwound skin appearance did not exhibit: Callus, Crepitus, Excoriation, Induration, Rash, Dry/Scaly, Maceration, Atrophie Blanche, Cyanosis, Ecchymosis, Hemosiderin Staining, Mottled, Pallor, Rubor, Erythema. The periwound has tenderness on palpation. Wound #8 status is Open. Original cause of wound was Not Known. The wound is located on the Right,Lateral Lower Leg. The wound measures 11.5cm length x 5.8cm width x 2.8cm depth; 52.386cm^2 area and 146.681cm^3 volume. There is Fat Layer (Subcutaneous Tissue) Exposed exposed. There is no tunneling or undermining noted. There is a small amount of purulent drainage noted. The wound margin is thickened. There is no granulation within the wound bed. There is a large (67- 100%) amount of necrotic tissue within the wound bed including Eschar and Adherent Slough. The periwound skin appearance exhibited: Hemosiderin Staining, Erythema. The periwound skin appearance did not exhibit: Callus, Crepitus, Excoriation, Induration, Rash, Scarring, Dry/Scaly, Maceration, Atrophie Blanche, Cyanosis, Ecchymosis, Mottled, Pallor, Rubor.  The surrounding wound skin color is noted with erythema which is circumferential. The periwound has tenderness on palpation. Wound #9 status is Open. Original cause of wound was Pressure Injury. The wound is located on the Right Gluteus. The wound measures 0cm length x 0cm width x 0cm depth; 0cm^2 area and 0cm^3 volume. There is no  tunneling or undermining noted. There is a none present amount of drainage noted. The wound margin is thickened. There is no granulation within the wound bed. There is no necrotic tissue within the wound bed. The periwound skin appearance exhibited: Hemosiderin Staining. 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 Cleanse wound with mild soap and water Wound #10 Abdomen - midline: Clean wound with Normal Saline. Cleanse wound with mild soap and water Wound #12 Left,Posterior Lower Leg: Clean wound with Normal Saline. Cleanse wound with mild soap and water Wound #4 Right Toe Second: Clean wound with Normal Saline. Cleanse wound with mild soap and water Gregory Tyler, Gregory R. ([id number]) Wound #6 Medial Sacrum: Clean wound with Normal Saline. Cleanse wound with mild soap and water Wound #8 Right,Lateral Lower Leg: Clean wound with Normal Saline. Cleanse wound with mild soap and water Primary Wound Dressing: Wound #1 Left Calcaneus: Other: - Betadine Wound #3 Right Toe Great: Other: - Betadine Wound #4 Right Toe Second: Other: - Betadine Wound #10 Abdomen - midline: Silver Alginate Wound #12 Left,Posterior Lower Leg: Silver Alginate Wound #6 Medial  Sacrum: Silver Alginate Wound #8 Right,Lateral Lower Leg: Silver Alginate - in clinic only Secondary Dressing: Wound #1 Left Calcaneus: Dry Gauze - on both heels Wound #10 Abdomen - midline: Boardered Foam Dressing Wound #12 Left,Posterior Lower Leg: Boardered Foam Dressing Wound #6 Medial Sacrum: Boardered Foam Dressing Wound #8 Right,Lateral Lower Leg: Boardered Foam Dressing - in clinic only Dressing Change Frequency: Wound #1 Left Calcaneus: Change dressing every day. Wound #10 Abdomen - midline: Change dressing every day. Wound #12 Left,Posterior Lower Leg: Change dressing every day. Wound #3 Right Toe Great: Change dressing every day. Wound #4 Right Toe Second: Change dressing every day. Wound #6 Medial Sacrum: Change dressing every day. Wound #8 Right,Lateral Lower Leg: Change Dressing Monday, Wednesday, Friday - and as needed Follow-up Appointments: Wound #1 Left Calcaneus: Return Appointment in 2 weeks. Wound #10 Abdomen - midline: Return Appointment in 2 weeks. Wound #12 Left,Posterior Lower Leg: Return Appointment in 2 weeks. Wound #3 Right Toe Great: Return Appointment in 2 weeks. Wound #4 Right Toe Second: Return Appointment in 2 weeks. Wound #6 Medial Sacrum: Return Appointment in 2 weeks. Gregory Tyler, Gregory Tyler ([id number]) Wound #8 Right,Lateral Lower Leg: Return Appointment in 2 weeks. Off-Loading: Wound #1 Left Calcaneus: Other: - Needs to wear Prevalon boots at all times while in bed. Additional Orders / Instructions: Vitamin A; Vitamin C, Zinc - Please add Decubivite to patient's medications to aid in wound healing Increase protein intake. Negative Pressure Wound Therapy: Wound #8 Right,Lateral Lower Leg: Wound VAC settings at 125/130 mmHg continuous pressure. Use BLACK/GREEN foam to wound cavity. Use WHITE foam to fill any tunnel/s and/or undermining. Change VAC dressing 3 X WEEK. Change canister as indicated when full. Nurse may titrate  settings and frequency of dressing changes as clinically indicated. - SNF to continue NPWT On evaluation today I feel like that the patient's wounds in general appear better than when I last saw him. With that being said there still a long ways to go before it a lot of these areas will be completely healed he  does need to be utilizing the problem on offloading boots on a daily basis. Subsequently I'm gonna see him back for reevaluation to see were things stand. Please see above for specific wound care orders. We will see patient for re-evaluation in 2 week(s) here in the clinic. If anything worsens or changes patient will contact our office for additional recommendations. Electronic Signature(s) Signed: 05/06/2018 5:17:02 PM By: Worthy Keeler PA-C Entered By: Worthy Keeler on 05/06/2018 16:52:18 DARICK, FETTERS ([id number]) -------------------------------------------------------------------------------- ROS/PFSH Details Patient Name: SHANA, YOUNGE. Date of Service: 05/06/2018 2:45 PM Medical Record Number: [id number] Patient Account Number: [id number] Date of Birth/Sex: 1940-06-24 (78 y.o. M) Treating RN: Harold Barban Primary Care Provider: Fulton Reek Other Clinician: Referring Provider: Fulton Reek Treating Provider/Extender: Melburn Hake, HOYT Weeks in Treatment: 11 Information Obtained From Patient Wound History Do you currently have one or more open woundso Yes How many open wounds do you currently haveo 6 Approximately how long have you had your woundso 1year How have you been treating your wound(s) until nowo betadine Has your wound(s) ever healed and then re-openedo No Have you had any lab work done in the past montho No Have you tested positive for an antibiotic resistant organism (MRSA, VRE)o No Have you tested positive for osteomyelitis (bone infection)o No Have you had any tests for circulation on your legso Yes Who ordered the [facility] Constitutional Symptoms (General Health) Complaints and Symptoms: Negative for: Fever; Chills Eyes Medical History: Negative for: Cataracts Ear/Nose/Mouth/Throat Medical History: Negative for: Chronic sinus problems/congestion Hematologic/Lymphatic Medical History: Positive for: Anemia Negative for: Hemophilia; Human Immunodeficiency Virus; Lymphedema; Sickle Cell Disease Respiratory Complaints and Symptoms: No Complaints or Symptoms Cardiovascular Complaints and Symptoms: No Complaints or Symptoms Medical History: Positive for: Arrhythmia; Hypertension Negative for: Angina; Congestive Heart Failure; Coronary Artery Disease; Deep Vein Thrombosis; Hypotension; Myocardial JEOFFREY, ELEAZER R. ([id number]) Infarction; Peripheral Arterial Disease; Peripheral Venous Disease; Phlebitis; Vasculitis Gastrointestinal Medical History: Negative for: Cirrhosis ; Colitis; Crohnos; Hepatitis A; Hepatitis B; Hepatitis C Endocrine Medical History: Positive for: Type II Diabetes Negative for: Type I Diabetes Treated with: Oral agents Genitourinary Medical History: Positive for: End Stage Renal Disease Immunological Medical History: Negative for: Lupus Erythematosus; Raynaudos; Scleroderma Oncologic Medical History: Positive for: Received Radiation Negative for: Received Chemotherapy Psychiatric Complaints and Symptoms: No Complaints or Symptoms Immunizations Pneumococcal Vaccine: Received Pneumococcal Vaccination: Yes Implantable Devices Family and Social History Cancer: No; Diabetes: No; Heart Disease: Yes - Mother; Hypertension: Yes; Kidney Disease: No; Lung Disease: No; Seizures: No; Stroke: No; Thyroid Problems: No; Tuberculosis: No; Former smoker; Marital Status - Married; Alcohol Use: Never; Drug Use: No History; Caffeine Use: Daily; Advanced Directives: Yes (Not Provided); Patient does not want information on Advanced Directives; Living Will: Yes (Not Provided);  Medical Power of Attorney: Yes (Not Provided) Physician Affirmation I have reviewed and agree with the above information. Electronic Signature(s) Signed: 05/06/2018 5:17:02 PM By: Worthy Keeler PA-C Signed: 05/08/2018 4:21:01 PM By: Harold Barban Entered By: Worthy Keeler on 05/06/2018 [place], Gregory Tyler ([id number]) -------------------------------------------------------------------------------- SuperBill Details Patient Name: RAYHAAN, HUSTER. Date of Service: 05/06/2018 Medical Record Number: [id number] Patient Account Number: [id number] Date of Birth/Sex: 1940/10/20 (78 y.o. M) Treating RN: Harold Barban Primary Care Provider: Fulton Reek Other Clinician: Referring Provider: Fulton Reek Treating Provider/Extender: Melburn Hake, HOYT Weeks in Treatment: 11 Diagnosis Coding ICD-10 Codes Code Description [phone] Pressure ulcer of left heel, stage 2 L89.610 Pressure ulcer of right heel, unstageable E11.621 Type 2 diabetes mellitus with foot  ulcer L97.512 Non-pressure chronic ulcer of other part of right foot with fat layer exposed L89.323 Pressure ulcer of left buttock, stage 3 L89.153 Pressure ulcer of sacral region, stage 3 I73.89 Other specified peripheral vascular diseases Facility Procedures CPT4 Code: [id number] Description: [phone] - WOUND CARE VISIT-LEV 5 EST PT Modifier: Quantity: 1 Physician Procedures CPT4 Code: [id number] Description: 50757 - WC PHYS LEVEL 4 - EST PT ICD-10 Diagnosis Description L89.622 Pressure ulcer of left heel, stage 2 L89.610 Pressure ulcer of right heel, unstageable E11.621 Type 2 diabetes mellitus with foot ulcer L97.512 Non-pressure chronic ulcer  of other part of right foot with fat Modifier: layer exposed Quantity: 1 Electronic Signature(s) Signed: 05/07/2018 2:18:24 PM By: Sharon Mt Signed: 05/08/2018 9:50:51 AM By: Worthy Keeler PA-C Previous Signature: 05/06/2018 5:17:02 PM Version By: Worthy Keeler PA-C Entered By:  Sharon Mt on 05/07/2018 14:18:08

## 2018-05-14 ENCOUNTER — Other Ambulatory Visit: Payer: Self-pay

## 2018-05-14 ENCOUNTER — Emergency Department: Payer: Medicare Other

## 2018-05-14 ENCOUNTER — Inpatient Hospital Stay
Admission: EM | Admit: 2018-05-14 | Discharge: 2018-05-17 | DRG: 871 | Disposition: A | Discharge status: Skilled Nursing Facility | Payer: Medicare Other | Attending: Internal Medicine | Admitting: Internal Medicine

## 2018-05-14 DIAGNOSIS — Z825 Family history of asthma and other chronic lower respiratory diseases: Secondary | ICD-10-CM

## 2018-05-14 DIAGNOSIS — I340 Nonrheumatic mitral (valve) insufficiency: Secondary | ICD-10-CM | POA: Diagnosis not present

## 2018-05-14 DIAGNOSIS — Z515 Encounter for palliative care: Secondary | ICD-10-CM | POA: Diagnosis not present

## 2018-05-14 DIAGNOSIS — E875 Hyperkalemia: Secondary | ICD-10-CM

## 2018-05-14 DIAGNOSIS — E872 Acidosis: Secondary | ICD-10-CM | POA: Diagnosis present

## 2018-05-14 DIAGNOSIS — Z79899 Other long term (current) drug therapy: Secondary | ICD-10-CM

## 2018-05-14 DIAGNOSIS — A419 Sepsis, unspecified organism: Principal | ICD-10-CM | POA: Diagnosis present

## 2018-05-14 DIAGNOSIS — N183 Chronic kidney disease, stage 3 (moderate): Secondary | ICD-10-CM | POA: Diagnosis present

## 2018-05-14 DIAGNOSIS — E860 Dehydration: Secondary | ICD-10-CM | POA: Diagnosis present

## 2018-05-14 DIAGNOSIS — I2510 Atherosclerotic heart disease of native coronary artery without angina pectoris: Secondary | ICD-10-CM | POA: Diagnosis present

## 2018-05-14 DIAGNOSIS — R6520 Severe sepsis without septic shock: Secondary | ICD-10-CM | POA: Diagnosis present

## 2018-05-14 DIAGNOSIS — D631 Anemia in chronic kidney disease: Secondary | ICD-10-CM | POA: Diagnosis present

## 2018-05-14 DIAGNOSIS — Z886 Allergy status to analgesic agent status: Secondary | ICD-10-CM

## 2018-05-14 DIAGNOSIS — Z7951 Long term (current) use of inhaled steroids: Secondary | ICD-10-CM

## 2018-05-14 DIAGNOSIS — J690 Pneumonitis due to inhalation of food and vomit: Secondary | ICD-10-CM | POA: Diagnosis present

## 2018-05-14 DIAGNOSIS — Z7401 Bed confinement status: Secondary | ICD-10-CM

## 2018-05-14 DIAGNOSIS — Z66 Do not resuscitate: Secondary | ICD-10-CM | POA: Diagnosis present

## 2018-05-14 DIAGNOSIS — R571 Hypovolemic shock: Secondary | ICD-10-CM | POA: Diagnosis present

## 2018-05-14 DIAGNOSIS — B965 Pseudomonas (aeruginosa) (mallei) (pseudomallei) as the cause of diseases classified elsewhere: Secondary | ICD-10-CM | POA: Diagnosis present

## 2018-05-14 DIAGNOSIS — I480 Paroxysmal atrial fibrillation: Secondary | ICD-10-CM | POA: Diagnosis present

## 2018-05-14 DIAGNOSIS — F329 Major depressive disorder, single episode, unspecified: Secondary | ICD-10-CM | POA: Diagnosis present

## 2018-05-14 DIAGNOSIS — N390 Urinary tract infection, site not specified: Secondary | ICD-10-CM | POA: Diagnosis present

## 2018-05-14 DIAGNOSIS — L89620 Pressure ulcer of left heel, unstageable: Secondary | ICD-10-CM | POA: Diagnosis present

## 2018-05-14 DIAGNOSIS — K649 Unspecified hemorrhoids: Secondary | ICD-10-CM | POA: Diagnosis present

## 2018-05-14 DIAGNOSIS — Z7189 Other specified counseling: Secondary | ICD-10-CM | POA: Diagnosis not present

## 2018-05-14 DIAGNOSIS — Z87891 Personal history of nicotine dependence: Secondary | ICD-10-CM

## 2018-05-14 DIAGNOSIS — I428 Other cardiomyopathies: Secondary | ICD-10-CM | POA: Diagnosis present

## 2018-05-14 DIAGNOSIS — E119 Type 2 diabetes mellitus without complications: Secondary | ICD-10-CM

## 2018-05-14 DIAGNOSIS — K219 Gastro-esophageal reflux disease without esophagitis: Secondary | ICD-10-CM | POA: Diagnosis present

## 2018-05-14 DIAGNOSIS — R0902 Hypoxemia: Secondary | ICD-10-CM | POA: Diagnosis present

## 2018-05-14 DIAGNOSIS — K625 Hemorrhage of anus and rectum: Secondary | ICD-10-CM | POA: Diagnosis present

## 2018-05-14 DIAGNOSIS — R7989 Other specified abnormal findings of blood chemistry: Secondary | ICD-10-CM | POA: Diagnosis present

## 2018-05-14 DIAGNOSIS — Z888 Allergy status to other drugs, medicaments and biological substances status: Secondary | ICD-10-CM

## 2018-05-14 DIAGNOSIS — I361 Nonrheumatic tricuspid (valve) insufficiency: Secondary | ICD-10-CM | POA: Diagnosis not present

## 2018-05-14 DIAGNOSIS — N189 Chronic kidney disease, unspecified: Secondary | ICD-10-CM

## 2018-05-14 DIAGNOSIS — E871 Hypo-osmolality and hyponatremia: Secondary | ICD-10-CM | POA: Diagnosis present

## 2018-05-14 DIAGNOSIS — Z833 Family history of diabetes mellitus: Secondary | ICD-10-CM

## 2018-05-14 DIAGNOSIS — K921 Melena: Secondary | ICD-10-CM | POA: Diagnosis present

## 2018-05-14 DIAGNOSIS — E1122 Type 2 diabetes mellitus with diabetic chronic kidney disease: Secondary | ICD-10-CM | POA: Diagnosis present

## 2018-05-14 DIAGNOSIS — I951 Orthostatic hypotension: Secondary | ICD-10-CM | POA: Diagnosis present

## 2018-05-14 DIAGNOSIS — I10 Essential (primary) hypertension: Secondary | ICD-10-CM | POA: Diagnosis present

## 2018-05-14 DIAGNOSIS — F419 Anxiety disorder, unspecified: Secondary | ICD-10-CM | POA: Diagnosis present

## 2018-05-14 DIAGNOSIS — Z9049 Acquired absence of other specified parts of digestive tract: Secondary | ICD-10-CM

## 2018-05-14 DIAGNOSIS — L89150 Pressure ulcer of sacral region, unstageable: Secondary | ICD-10-CM | POA: Diagnosis present

## 2018-05-14 DIAGNOSIS — K254 Chronic or unspecified gastric ulcer with hemorrhage: Secondary | ICD-10-CM | POA: Diagnosis present

## 2018-05-14 DIAGNOSIS — Z923 Personal history of irradiation: Secondary | ICD-10-CM

## 2018-05-14 DIAGNOSIS — I5042 Chronic combined systolic (congestive) and diastolic (congestive) heart failure: Secondary | ICD-10-CM | POA: Diagnosis present

## 2018-05-14 DIAGNOSIS — I130 Hypertensive heart and chronic kidney disease with heart failure and stage 1 through stage 4 chronic kidney disease, or unspecified chronic kidney disease: Secondary | ICD-10-CM | POA: Diagnosis present

## 2018-05-14 DIAGNOSIS — K922 Gastrointestinal hemorrhage, unspecified: Secondary | ICD-10-CM

## 2018-05-14 DIAGNOSIS — L89610 Pressure ulcer of right heel, unstageable: Secondary | ICD-10-CM | POA: Diagnosis present

## 2018-05-14 DIAGNOSIS — Z9884 Bariatric surgery status: Secondary | ICD-10-CM

## 2018-05-14 DIAGNOSIS — Z8249 Family history of ischemic heart disease and other diseases of the circulatory system: Secondary | ICD-10-CM

## 2018-05-14 DIAGNOSIS — E785 Hyperlipidemia, unspecified: Secondary | ICD-10-CM | POA: Diagnosis present

## 2018-05-14 DIAGNOSIS — Z8711 Personal history of peptic ulcer disease: Secondary | ICD-10-CM

## 2018-05-14 DIAGNOSIS — R778 Other specified abnormalities of plasma proteins: Secondary | ICD-10-CM | POA: Diagnosis present

## 2018-05-14 DIAGNOSIS — Z7984 Long term (current) use of oral hypoglycemic drugs: Secondary | ICD-10-CM

## 2018-05-14 DIAGNOSIS — R945 Abnormal results of liver function studies: Secondary | ICD-10-CM

## 2018-05-14 DIAGNOSIS — Z8546 Personal history of malignant neoplasm of prostate: Secondary | ICD-10-CM

## 2018-05-14 DIAGNOSIS — N179 Acute kidney failure, unspecified: Secondary | ICD-10-CM

## 2018-05-14 DIAGNOSIS — Z7901 Long term (current) use of anticoagulants: Secondary | ICD-10-CM

## 2018-05-14 DIAGNOSIS — J449 Chronic obstructive pulmonary disease, unspecified: Secondary | ICD-10-CM | POA: Diagnosis present

## 2018-05-14 DIAGNOSIS — R748 Abnormal levels of other serum enzymes: Secondary | ICD-10-CM | POA: Diagnosis not present

## 2018-05-14 DIAGNOSIS — G8929 Other chronic pain: Secondary | ICD-10-CM | POA: Diagnosis present

## 2018-05-14 DIAGNOSIS — M109 Gout, unspecified: Secondary | ICD-10-CM | POA: Diagnosis present

## 2018-05-14 DIAGNOSIS — Z885 Allergy status to narcotic agent status: Secondary | ICD-10-CM

## 2018-05-14 DIAGNOSIS — Z823 Family history of stroke: Secondary | ICD-10-CM

## 2018-05-14 DIAGNOSIS — Z87442 Personal history of urinary calculi: Secondary | ICD-10-CM

## 2018-05-14 LAB — CBC WITH DIFFERENTIAL/PLATELET
ABS IMMATURE GRANULOCYTES: 0.08 10*3/uL — AB (ref 0.00–0.07)
Basophils Absolute: 0 10*3/uL (ref 0.0–0.1)
Basophils Relative: 0 %
Eosinophils Absolute: 0 10*3/uL (ref 0.0–0.5)
Eosinophils Relative: 0 %
HCT: 34.9 % — ABNORMAL LOW (ref 39.0–52.0)
Hemoglobin: 10.8 g/dL — ABNORMAL LOW (ref 13.0–17.0)
IMMATURE GRANULOCYTES: 1 %
Lymphocytes Relative: 10 %
Lymphs Abs: 1.5 10*3/uL (ref 0.7–4.0)
MCH: 28.1 pg (ref 26.0–34.0)
MCHC: 30.9 g/dL (ref 30.0–36.0)
MCV: 90.6 fL (ref 80.0–100.0)
Monocytes Absolute: 1 10*3/uL (ref 0.1–1.0)
Monocytes Relative: 6 %
NEUTROS PCT: 83 %
NRBC: 0 % (ref 0.0–0.2)
Neutro Abs: 12.7 10*3/uL — ABNORMAL HIGH (ref 1.7–7.7)
Platelets: 338 10*3/uL (ref 150–400)
RBC: 3.85 MIL/uL — ABNORMAL LOW (ref 4.22–5.81)
RDW: 17 % — ABNORMAL HIGH (ref 11.5–15.5)
WBC: 15.2 10*3/uL — ABNORMAL HIGH (ref 4.0–10.5)

## 2018-05-14 LAB — URINALYSIS, COMPLETE (UACMP) WITH MICROSCOPIC
BACTERIA UA: NONE SEEN
Bilirubin Urine: NEGATIVE
Glucose, UA: NEGATIVE mg/dL
Ketones, ur: NEGATIVE mg/dL
Nitrite: NEGATIVE
Protein, ur: 100 mg/dL — AB
Specific Gravity, Urine: 1.014 (ref 1.005–1.030)
Squamous Epithelial / HPF: NONE SEEN (ref 0–5)
WBC, UA: >50 WBC/hpf — ABNORMAL HIGH (ref 0–5)
pH: 6 (ref 5.0–8.0)

## 2018-05-14 LAB — COMPREHENSIVE METABOLIC PANEL
ALT: 129 U/L — ABNORMAL HIGH (ref 0–44)
AST: 83 U/L — ABNORMAL HIGH (ref 15–41)
Albumin: 2.5 g/dL — ABNORMAL LOW (ref 3.5–5.0)
Alkaline Phosphatase: 266 U/L — ABNORMAL HIGH (ref 38–126)
Anion gap: 20 — ABNORMAL HIGH (ref 5–15)
BUN: 116 mg/dL — ABNORMAL HIGH (ref 8–23)
CHLORIDE: 101 mmol/L (ref 98–111)
CO2: 11 mmol/L — ABNORMAL LOW (ref 22–32)
Calcium: 6.2 mg/dL — CL (ref 8.9–10.3)
Creatinine, Ser: 5.9 mg/dL — ABNORMAL HIGH (ref 0.61–1.24)
GFR calc Af Amer: 10 mL/min — ABNORMAL LOW (ref >60)
GFR calc non Af Amer: 8 mL/min — ABNORMAL LOW (ref >60)
Glucose, Bld: 82 mg/dL (ref 70–99)
Potassium: 7.1 mmol/L (ref 3.5–5.1)
Sodium: 132 mmol/L — ABNORMAL LOW (ref 135–145)
Total Bilirubin: 0.6 mg/dL (ref 0.3–1.2)
Total Protein: 6.2 g/dL — ABNORMAL LOW (ref 6.5–8.1)

## 2018-05-14 LAB — TROPONIN I: Troponin I: 0.19 ng/mL (ref <0.03)

## 2018-05-14 LAB — PROTIME-INR
INR: 2.63
Prothrombin Time: 27.7 seconds — ABNORMAL HIGH (ref 11.4–15.2)

## 2018-05-14 LAB — MAGNESIUM: Magnesium: 2.1 mg/dL (ref 1.7–2.4)

## 2018-05-14 LAB — PREPARE RBC (CROSSMATCH)

## 2018-05-14 LAB — LACTIC ACID, PLASMA: Lactic Acid, Venous: 4.6 mmol/L (ref 0.5–1.9)

## 2018-05-14 LAB — LIPASE, BLOOD: Lipase: 57 U/L — ABNORMAL HIGH (ref 11–51)

## 2018-05-14 MED ORDER — ALBUTEROL SULFATE (2.5 MG/3ML) 0.083% IN NEBU: 3.0000 mL | INHALATION_SOLUTION | RESPIRATORY_TRACT | Status: DC | PRN

## 2018-05-14 MED ORDER — INSULIN ASPART 100 UNIT/ML ~~LOC~~ SOLN: 0.0000 [IU] | Freq: Every day | SUBCUTANEOUS | Status: DC

## 2018-05-14 MED ORDER — CALCIUM GLUCONATE-NACL 1-0.675 GM/50ML-% IV SOLN
1.0000 g | Freq: Once | INTRAVENOUS | Status: AC
  Administered 2018-05-14: 1000 mg via INTRAVENOUS
  Filled 2018-05-14: qty 50

## 2018-05-14 MED ORDER — SODIUM CHLORIDE 0.9 % IV SOLN
Freq: Once | INTRAVENOUS | Status: AC
  Administered 2018-05-14: 22:00:00 via INTRAVENOUS

## 2018-05-14 MED ORDER — SODIUM CHLORIDE 0.9 % IV SOLN: 1.0000 g | Freq: Once | INTRAVENOUS | Status: DC

## 2018-05-14 MED ORDER — SODIUM CHLORIDE 0.9 % IV SOLN: 10.0000 mL/h | Freq: Once | INTRAVENOUS | Status: DC

## 2018-05-14 MED ORDER — SODIUM CHLORIDE 0.9 % IV SOLN: 1.0000 g | INTRAVENOUS | Status: DC

## 2018-05-14 MED ORDER — DEXTROSE 50 % IV SOLN
25.0000 g | Freq: Once | INTRAVENOUS | Status: AC
  Administered 2018-05-14: 25 g via INTRAVENOUS
  Filled 2018-05-14: qty 50

## 2018-05-14 MED ORDER — ONDANSETRON HCL 4 MG PO TABS: 4.0000 mg | ORAL_TABLET | Freq: Four times a day (QID) | ORAL | Status: DC | PRN

## 2018-05-14 MED ORDER — SODIUM CHLORIDE 0.9 % IV BOLUS
500.0000 mL | Freq: Once | INTRAVENOUS | Status: AC
  Administered 2018-05-14: 500 mL via INTRAVENOUS

## 2018-05-14 MED ORDER — SODIUM BICARBONATE 8.4 % IV SOLN
50.0000 meq | Freq: Once | INTRAVENOUS | Status: AC
  Administered 2018-05-14: 50 meq via INTRAVENOUS
  Filled 2018-05-14: qty 50

## 2018-05-14 MED ORDER — INSULIN ASPART 100 UNIT/ML ~~LOC~~ SOLN
0.0000 [IU] | Freq: Three times a day (TID) | SUBCUTANEOUS | Status: DC
  Administered 2018-05-15 – 2018-05-17 (×4): 1 [IU] via SUBCUTANEOUS
  Filled 2018-05-14 (×4): qty 1

## 2018-05-14 MED ORDER — ONDANSETRON HCL 4 MG/2ML IJ SOLN: 4.0000 mg | Freq: Four times a day (QID) | INTRAMUSCULAR | Status: DC | PRN

## 2018-05-14 MED ORDER — INSULIN ASPART 100 UNIT/ML ~~LOC~~ SOLN
10.0000 [IU] | Freq: Once | SUBCUTANEOUS | Status: AC
  Administered 2018-05-14: 10 [IU] via INTRAVENOUS
  Filled 2018-05-14: qty 1

## 2018-05-14 MED ORDER — TIOTROPIUM BROMIDE MONOHYDRATE 18 MCG IN CAPS
18.0000 ug | ORAL_CAPSULE | Freq: Every day | RESPIRATORY_TRACT | Status: DC
  Administered 2018-05-15 – 2018-05-17 (×2): 18 ug via RESPIRATORY_TRACT
  Filled 2018-05-14 (×2): qty 5

## 2018-05-14 MED ORDER — ACETAMINOPHEN 650 MG RE SUPP: 650.0000 mg | Freq: Four times a day (QID) | RECTAL | Status: DC | PRN

## 2018-05-14 MED ORDER — SODIUM CHLORIDE 0.9 % IV SOLN: INTRAVENOUS | Status: DC

## 2018-05-14 MED ORDER — APIXABAN 5 MG PO TABS: 5.0000 mg | ORAL_TABLET | Freq: Two times a day (BID) | ORAL | Status: DC

## 2018-05-14 MED ORDER — PATIROMER SORBITEX CALCIUM 8.4 G PO PACK
8.4000 g | PACK | Freq: Every day | ORAL | Status: DC
  Administered 2018-05-14 – 2018-05-15 (×2): 8.4 g via ORAL
  Filled 2018-05-14 (×2): qty 1

## 2018-05-14 MED ORDER — ACETAMINOPHEN 325 MG PO TABS
650.0000 mg | ORAL_TABLET | Freq: Four times a day (QID) | ORAL | Status: DC | PRN
  Administered 2018-05-16: 650 mg via ORAL
  Filled 2018-05-14: qty 2

## 2018-05-14 MED ORDER — ROSUVASTATIN CALCIUM 10 MG PO TABS
20.0000 mg | ORAL_TABLET | Freq: Every day | ORAL | Status: DC
  Administered 2018-05-15 – 2018-05-16 (×2): 20 mg via ORAL
  Filled 2018-05-14 (×2): qty 2

## 2018-05-14 NOTE — ED Notes (Addendum)
prbc [id number]  [id number] Exp date 06/03/18  1 unit of emergency blood given to pt

## 2018-05-14 NOTE — ED Notes (Signed)
prbc complete.  No reaction noted.  Pt waiting on admission.

## 2018-05-14 NOTE — ED Notes (Signed)
Pt sleeping. 

## 2018-05-14 NOTE — ED Notes (Signed)
Pt has a wound vac to right lower leg.

## 2018-05-14 NOTE — ED Notes (Signed)
Pt brought in via ems from [facility].  Pt reports abd pain with rectal bleeding for 1 day.  Pt has decub to sacral area.  Pt has dark brown bleeding from rectum.  Oxygen sats low on arrival so pt placed on 2 liters oxygen on arrival   Pt alert speech clear.

## 2018-05-14 NOTE — ED Triage Notes (Signed)
Pt brought in via ems from [facility].  Pt reports rectal bleeding and low oxygen sats.  Pt alert and talking on arrival to er treatment room.  md at bedside.

## 2018-05-14 NOTE — H&P (Signed)
[facility] NAME: Gregory Tyler    MR#:  [id number]  DATE OF BIRTH:  25-Feb-1941  DATE OF ADMISSION:  05/14/2018  PRIMARY CARE PHYSICIAN: [name], MD   REQUESTING/REFERRING PHYSICIAN: Jimmye Norman, MD  CHIEF COMPLAINT:   Chief Complaint  Patient presents with  . Rectal Bleeding    HISTORY OF PRESENT ILLNESS:  Gregory Tyler  is a 78 y.o. male who presents with chief complaint as above. Patient presents from nursing facility with complaint of rectal bleeding.  Patient himself states that he has not had much to eat for the past 7 to 8 days.  He states that he has not had any desire to eat as he has had significant nausea whenever food was brought in.  On evaluation here in the ED he has significant renal dysfunction, with his creatinine greater than 6 today whereas his baseline was around 1.5.  His potassium was 7.1 with some EKG abnormalities.  He appears to have a UTI and does state that his been having some dysuria.  His hemoglobin is okay at 10.8, though he was guaiac positive in the ED.  Hospitalist were called for admission  PAST MEDICAL HISTORY:   Past Medical History:  Diagnosis Date  . Anemia   . Anxiety   . Arthritis   . Cardiomyopathy ([place])    a. 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  Marland Kitchen Chronic combined systolic (congestive) and diastolic (congestive) heart failure ([place])    a. 05/2015 TEE: EF 35-40% (in setting of Afib); b. 08/2017 Echo: EF 55-60%, no rwma, Gr2DD; b. 01/2018 Echo: EF 55-60%, mild MR. Nl LA size. Nl RV fxn.  . Chronic Pelvic pain   . Coronary artery disease    a. 05/2015 CTA Chest: 3 vessel coronary Ca2+.  . Depression   . Diabetes mellitus without complication ([place])   . Emphysema lung ([place])   . Gastric ulcer   . Gout   . Hyperlipidemia   . Hypertension   . Nephrolithiasis   . PAF (paroxysmal atrial fibrillation) ([place])    a. 05/2015 s/p DCCV; b. 03/2016 s/p DCCV;  c. CHA2DS2VASc = 6 ([place]).  . Prostate cancer [facility])    Prostate  . Stage III chronic kidney disease ([place])   . Weight loss    50 lb since Apr 24, 2015     PAST SURGICAL HISTORY:   Past Surgical History:  Procedure Laterality Date  . APPENDECTOMY    . APPLICATION OF WOUND VAC Right 04/22/2018   Procedure: APPLICATION OF WOUND VAC;  Surgeon: Katha Cabal, MD;  Location: [facility] ORS;  Service: Vascular;  Laterality: Right;  . BACK SURGERY    . CHOLECYSTECTOMY    . DIALYSIS/PERMA CATHETER INSERTION N/A 01/22/2018   Procedure: DIALYSIS/PERMA CATHETER INSERTION;  Surgeon: Katha Cabal, MD;  Location: [place] [facility];  Service: Cardiovascular;  Laterality: N/A;  . DIALYSIS/PERMA CATHETER REMOVAL N/A 04/01/2018   Procedure: DIALYSIS/PERMA CATHETER REMOVAL;  Surgeon: Katha Cabal, MD;  Location: [place] [facility];  Service: Cardiovascular;  Laterality: N/A;  . ELECTROPHYSIOLOGIC STUDY N/A 06/17/2015   Procedure: CARDIOVERSION;  Surgeon: Wellington Hampshire, MD;  Location: [facility];  Service: Cardiovascular;  Laterality: N/A;  . ELECTROPHYSIOLOGIC STUDY N/A 03/30/2016   Procedure: CARDIOVERSION;  Surgeon: Minna Merritts, MD;  Location: [facility];  Service: Cardiovascular;  Laterality: N/A;  . FLUOROSCOPY GUIDANCE Right 04/25/2018   Procedure: FLUOROSCOPY  GUIDANCE;  Surgeon: Katha Cabal, MD;  Location: [place] [facility];  Service: Cardiovascular;  Laterality: Right;  . GASTRIC BYPASS    . HEMORRHOID SURGERY    . I&D EXTREMITY Right 04/22/2018   Procedure: DEBRIDEMENT CALF;  Surgeon: Katha Cabal, MD;  Location: [facility] OR[facility];  Service: Vascular;  Laterality: Right;  . LOWER EXTREMITY ANGIOGRAPHY Right 01/23/2018   Procedure: LOWER EXTREMITY ANGIOGRAPHY;  Surgeon: Algernon Huxley, MD;  Location: [place] [facility];  Service: Cardiovascular;  Laterality: Right;  . LOWER EXTREMITY ANGIOGRAPHY Left 01/30/2018   Procedure: LOWER EXTREMITY ANGIOGRAPHY;  Surgeon: Algernon Huxley, MD;  Location: [place] [facility];  Service: Cardiovascular;  Laterality: Left;  . PERIPHERAL VASCULAR BALLOON ANGIOPLASTY Right 04/09/2018   Procedure: PERIPHERAL VASCULAR BALLOON ANGIOPLASTY;  Surgeon: Katha Cabal, MD;  Location: [place] [facility];  Service: Cardiovascular;  Laterality: Right;  . pilonidal cyst    . TEE WITHOUT CARDIOVERSION N/A 06/17/2015   Procedure: TRANSESOPHAGEAL ECHOCARDIOGRAM (TEE);  Surgeon: Wellington Hampshire, MD;  Location: [facility] OR[facility];  Service: Cardiovascular;  Laterality: N/A;     SOCIAL HISTORY:   Social History   Tobacco Use  . Smoking status: Former Smoker    Last attempt to quit: 12/19/1940    Years since quitting: 77.4  . Smokeless tobacco: Former Systems developer    Types: Chew    Quit date: 11/19/2014  Substance Use Topics  . Alcohol use: No     FAMILY HISTORY:   Family History  Problem Relation Age of Onset  . CAD Mother   . Stroke Mother   . COPD Mother   . Heart failure Mother   . Diabetes Other      DRUG ALLERGIES:   Allergies  Allergen Reactions  . Aspirin Other (See Comments)    GI Bleeding  . Tuberculin Tests Other (See Comments)    unknown  . Codeine Other (See Comments)    BLISTERS     MEDICATIONS AT HOME:   Prior to Admission medications   Medication Sig Start Date End Date Taking? Authorizing Provider  acetaminophen (TYLENOL) 325 MG tablet Take 2 tablets (650 mg total) by mouth every 6 (six) hours as needed for mild pain (or Fever >/= 101). 09/15/17  Yes Wieting, Richard, MD  albuterol (PROVENTIL HFA;VENTOLIN HFA) 108 (90 Base) MCG/ACT inhaler Inhale 1 puff into the lungs every 4 (four) hours as needed for wheezing or shortness of breath.   Yes [provider]  allopurinol (ZYLOPRIM) 300 MG tablet Take 300 mg by mouth daily.    Yes [provider]  apixaban (ELIQUIS) 5 MG TABS tablet Take 1 tablet (5 mg total) by mouth 2 (two) times daily. 04/10/18  Yes Bettey Costa, MD  digoxin 62.5 MCG TABS Take  0.0625 mg by mouth daily. 04/10/18  Yes Mody, Ulice Bold, MD  diltiazem (CARDIZEM) 30 MG tablet Take 1 tablet (30 mg total) by mouth every 8 (eight) hours. 04/10/18  Yes Mody, Ulice Bold, MD  gabapentin (NEURONTIN) 100 MG capsule Take 1 capsule (100 mg total) by mouth at bedtime. 09/15/17  Yes Wieting, Richard, MD  Hypromellose (ARTIFICIAL TEARS) 0.4 % SOLN Place 1 drop into both eyes 2 (two) times daily.   Yes [provider]  ipratropium-albuterol (DUONEB) 0.5-2.5 (3) MG/3ML SOLN Take 3 mLs by nebulization every 8 (eight) hours as needed (COPD/SHOB).   Yes [provider]  metFORMIN (GLUCOPHAGE) 500 MG tablet Take 1 tablet (500 mg total) by mouth daily with breakfast.  04/11/18  Yes Bettey Costa, MD  metoprolol tartrate (LOPRESSOR) 25 MG tablet Take 0.5 tablets (12.5 mg total) by mouth 2 (two) times daily. 04/28/18  Yes [name], MD  rosuvastatin (CRESTOR) 20 MG tablet Take 20 mg by mouth at bedtime.    Yes [provider]  torsemide (DEMADEX) 20 MG tablet Take 20 mg by mouth daily.   Yes [provider]  vitamin A 8000 UNIT capsule Take 8,000 Units by mouth daily.   Yes [provider]  vitamin C (ASCORBIC ACID) 250 MG tablet Take 250 mg by mouth 2 (two) times daily.   Yes [provider]  zinc gluconate 50 MG tablet Take 50 mg by mouth daily.   Yes [provider]  collagenase (SANTYL) ointment Apply 1 application topically every evening. Apply to eschar on RLE wound    [provider]  HYDROcodone-acetaminophen (NORCO/VICODIN) 5-325 MG tablet Take 1 tablet by mouth every 6 (six) hours as needed for moderate pain or severe pain. Patient not taking: Reported on 05/14/2018 04/28/18   [name], MD  tiotropium (SPIRIVA) 18 MCG inhalation capsule Place 18 mcg into inhaler and inhale daily.     [provider]    REVIEW OF SYSTEMS:  Review of Systems  Constitutional: Positive for malaise/fatigue. Negative for  chills, fever and weight loss.  HENT: Negative for ear pain, hearing loss and tinnitus.   Eyes: Negative for blurred vision, double vision, pain and redness.  Respiratory: Negative for cough, hemoptysis and shortness of breath.   Cardiovascular: Negative for chest pain, palpitations, orthopnea and leg swelling.  Gastrointestinal: Positive for nausea. Negative for abdominal pain, constipation, diarrhea and vomiting.  Genitourinary: Positive for dysuria. Negative for frequency and hematuria.  Musculoskeletal: Negative for back pain, joint pain and neck pain.  Skin:       No acne, rash, or lesions  Neurological: Negative for dizziness, tremors, focal weakness and weakness.  Endo/Heme/Allergies: Negative for polydipsia. Does not bruise/bleed easily.  Psychiatric/Behavioral: Negative for depression. The patient is not nervous/anxious and does not have insomnia.      VITAL SIGNS:   Vitals:   05/14/18 2140 05/14/18 2200 05/14/18 2220 05/14/18 2230  BP: 93/67 102/81 95/67   Pulse:    66  Resp: 15     Temp:      TempSrc:      SpO2:    100%  Weight:      Height:       Wt Readings from Last 3 Encounters:  05/14/18 72.6 kg  04/25/18 72.6 kg  04/10/18 65.8 kg    PHYSICAL EXAMINATION:  Physical Exam  Vitals reviewed. Constitutional: He is oriented to person, place, and time. He appears well-developed and well-nourished. No distress.  HENT:  Head: Normocephalic and atraumatic.  Dry mucous membranes  Eyes: Pupils are equal, round, and reactive to light. Conjunctivae and EOM are normal. No scleral icterus.  Neck: Normal range of motion. Neck supple. No JVD present. No thyromegaly present.  Cardiovascular: Normal rate, regular rhythm and intact distal pulses. Exam reveals no gallop and no friction rub.  No murmur heard. Respiratory: Effort normal and breath sounds normal. No respiratory distress. He has no wheezes. He has no rales.  GI: Soft. Bowel sounds are normal. He exhibits no  distension. There is no abdominal tenderness.  Musculoskeletal: Normal range of motion.        General: No edema.     Comments: No arthritis, no gout  Lymphadenopathy:  He has no cervical adenopathy.  Neurological: He is alert and oriented to person, place, and time. No cranial nerve deficit.  No dysarthria, no aphasia  Skin: Skin is warm and dry. No rash noted. No erythema.  Psychiatric: He has a normal mood and affect. His behavior is normal. Judgment and thought content normal.    LABORATORY PANEL:   CBC Recent Labs  Lab 05/14/18 1931  WBC 15.2*  HGB 10.8*  HCT 34.9*  PLT 338   ------------------------------------------------------------------------------------------------------------------  Chemistries  Recent Labs  Lab 05/14/18 1931  NA 132*  K 7.1*  CL 101  CO2 11*  GLUCOSE 82  BUN 116*  CREATININE 5.90*  CALCIUM 6.2*  AST 83*  ALT 129*  ALKPHOS 266*  BILITOT 0.6   ------------------------------------------------------------------------------------------------------------------  Cardiac Enzymes Recent Labs  Lab 05/14/18 1931  TROPONINI 0.19*   ------------------------------------------------------------------------------------------------------------------  RADIOLOGY:  Dg Chest Port 1 View  Result Date: 05/14/2018 CLINICAL DATA:  Rectal bleeding low oxygen saturation EXAM: PORTABLE CHEST 1 VIEW COMPARISON:  04/07/2018 FINDINGS: Retrocardiac opacity on the left. No pleural effusion. Stable cardiomediastinal silhouette. No pneumothorax. IMPRESSION: Left retrocardiac opacity may reflect atelectasis, pneumonia, or aspiration. Electronically Signed   By: Donavan Foil M.D.   On: 05/14/2018 22:36    EKG:   Orders placed or performed during the hospital encounter of 04/21/18  . EKG 12-Lead  . EKG 12-Lead    IMPRESSION AND PLAN:  Principal Problem:   UTI (urinary tract infection) -on suspicion the patient may have developed this UTI more than a week  ago given his report of malaise and decreased appetite for the past 7-8 days.  IV antibiotics initiated, urine culture ordered.  I suspect his significant renal failure and subsequent electrolyte abnormalities stem from this past week of significantly decreased p.o. intake with severe dehydration Active Problems:   Hyperkalemia -treated with temporizing medications in the ED and then [place].  Trend potassium until within normal limits   Dehydration -IV fluids given in the ED, we will continue these at a slower rate overnight given his history of heart failure   Acute renal failure superimposed on chronic kidney disease ([place]) -avoid nephrotoxins, IV fluids as above, monitor creatinine for improvement.  If he does not improve significantly over the next 24 hours could consider nephrology consult   Rectal bleed -unclear history here.  Patient states that he was told by facility workers that he had a rectal bleed.  He was guaiac positive in the ED, though his hemoglobin is 10.8 today from a prior value about 3 weeks ago of 9.  We have ordered a GI consult to help with further diagnosis   Elevated troponin -this could be related to his infection and troponin leak given his history of heart failure.  However, we will trend his cardiac enzymes and consider cardiology consult based on these results.  Patient has no complaint of chest pain or cardiac symptoms.   Chronic combined systolic and diastolic CHF (congestive heart failure) (HCC) -continue home meds, monitor closely   Coronary artery disease involving native coronary artery of native heart without angina pectoris -continue home medications   Essential hypertension -hold antihypertensives for now as the patient's blood pressure was low, IV fluids for blood pressure support as above   Diabetes (HCC) -sliding scale insulin coverage   Paroxysmal atrial fibrillation ([place]) -continue home rate controlling medications as well as anticoagulation   Hyperlipidemia  -Home dose antilipid  Chart review performed and case discussed with ED provider. Labs,  imaging and/or ECG reviewed by provider and discussed with patient/family. Management plans discussed with the patient and/or family.  DVT PROPHYLAXIS: Systemic anticoagulation  GI PROPHYLAXIS:  None  ADMISSION STATUS: Inpatient     CODE STATUS: DNR Code Status History    Date Active Date Inactive Code Status Order ID Comments User Context   04/07/2018 1848 04/10/2018 1828 DNR [id number]  Henreitta Leber, MD Inpatient   01/20/2018 1242 01/30/2018 2345 DNR [id number]  Knox Royalty, NP Inpatient   01/19/2018 1329 01/20/2018 1242 Full Code [id number]  Salary, Avel Peace, MD Inpatient   01/13/2018 1712 01/19/2018 1329 DNR [id number]  Dustin Flock, MD Inpatient   12/19/2017 1932 12/20/2017 2151 DNR [id number]  [name], MD Inpatient   11/17/2017 1357 11/19/2017 1603 DNR [id number]  Hermelinda Dellen, DO Inpatient   11/15/2017 1550 11/17/2017 1357 Full Code [id number]  Loletha Grayer, MD ED   09/11/2017 1951 09/15/2017 1603 Full Code [id number]  Bettey Costa, MD ED   05/11/2015 1307 05/13/2015 1256 Full Code [id number]  Aldean Jewett, MD Inpatient   04/25/2015 0149 04/27/2015 1741 Full Code [id number]  Hillary Bow, MD ED    Questions for Most Recent Historical Code Status (Order [id number])    Question Answer Comment   In the event of cardiac or respiratory ARREST Do not call a "code blue"    In the event of cardiac or respiratory ARREST Do not perform Intubation, CPR, defibrillation or ACLS    In the event of cardiac or respiratory ARREST Use medication by any route, position, wound care, and other measures to relive pain and suffering. May use oxygen, suction and manual treatment of airway obstruction as needed for comfort.         Advance Directive Documentation     Most Recent Value  Type of Advance Directive  Out of facility DNR (pink MOST or yellow form)  Pre-existing out of facility DNR order (yellow form  or pink MOST form)  -  "MOST" Form in Place?  -      TOTAL CRITICAL CARE TIME TAKING CARE OF THIS PATIENT: 50 minutes.   Ethlyn Daniels 05/14/2018, 10:52 PM  [facility]  Office  [phone]  CC: Primary care physician; [name], MD  Note:  This document was prepared using Dragon voice recognition software and may include unintentional dictation errors.

## 2018-05-14 NOTE — ED Notes (Addendum)
md in with pt   Blood verified with cole amoriello rn.  1 unit of prbc's started.  Pt alert and talking.  afib on monitor.  Skin warm and dry.  Pt on 2 liters oxygen.  This RN with pt while blood infusing.  Pt informed to tell nurse if any signs/sx of reaction to blood products.

## 2018-05-14 NOTE — ED Provider Notes (Signed)
[facility] Emergency Department Provider Note       Time seen: ----------------------------------------- 7:51 PM on 05/14/2018 -----------------------------------------   I have reviewed the triage vital signs and the nursing notes.  HISTORY   Chief Complaint Rectal Bleeding    HPI Gregory Tyler is a 78 y.o. male with a history of anemia, anxiety, arthritis, cardiomyopathy, chronic pelvic pain, coronary artery disease, gastric ulcer, gout, hyperlipidemia, hypertension, paroxysmal atrial fibrillation who presents to the ED for rectal bleeding and low oxygen saturations.  Patient is alert and talking on arrival.  Past Medical History:  Diagnosis Date  . Anemia   . Anxiety   . Arthritis   . Cardiomyopathy ([place])    a. 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  Marland Kitchen Chronic combined systolic (congestive) and diastolic (congestive) heart failure ([place])    a. 05/2015 TEE: EF 35-40% (in setting of Afib); b. 08/2017 Echo: EF 55-60%, no rwma, Gr2DD; b. 01/2018 Echo: EF 55-60%, mild MR. Nl LA size. Nl RV fxn.  . Chronic Pelvic pain   . Coronary artery disease    a. 05/2015 CTA Chest: 3 vessel coronary Ca2+.  . Depression   . Diabetes mellitus without complication ([place])   . Emphysema lung ([place])   . Gastric ulcer   . Gout   . Hyperlipidemia   . Hypertension   . Nephrolithiasis   . PAF (paroxysmal atrial fibrillation) ([place])    a. 05/2015 s/p DCCV; b. 03/2016 s/p DCCV; c. CHA2DS2VASc = 6 ([place]).  . Prostate cancer [facility])    Prostate  . Stage III chronic kidney disease ([place])   . Weight loss    50 lb since Apr 24, 2015    Patient Active Problem List   Diagnosis Date Noted  . Wound infection 04/21/2018  . Atrial fibrillation with RVR ([place]) 04/07/2018  . Protein-calorie malnutrition, severe 01/21/2018  . Acute renal failure superimposed on chronic kidney disease ([place])   . Palliative care by specialist   . DNR (do not resuscitate)   .  Sepsis ([place]) 01/13/2018  . Atherosclerotic PVD with ulceration ([place]) 01/09/2018  . Diarrhea 12/19/2017  . Pressure injury of skin 11/16/2017  . Enteritis 11/15/2017  . Malnutrition of moderate degree 09/13/2017  . Elevated INR 09/02/2017  . Paroxysmal atrial fibrillation ([place]) 10/25/2016  . Weakness generalized   . Hyperlipidemia 08/08/2015  . Diabetes ([place]) 05/19/2015  . Hyperkalemia   . Dehydration   . Orthostatic hypotension 05/09/2015  . Congestive dilated cardiomyopathy ([place]) 05/09/2015  . Polypharmacy 04/27/2015  . Chronic diastolic CHF (congestive heart failure) ([place]) 04/25/2015  . SOB (shortness of breath)   . Coronary artery disease involving native coronary artery of native heart without angina pectoris   . Essential hypertension     Past Surgical History:  Procedure Laterality Date  . APPENDECTOMY    . APPLICATION OF WOUND VAC Right 04/22/2018   Procedure: APPLICATION OF WOUND VAC;  Surgeon: Katha Cabal, MD;  Location: [facility] ORS;  Service: Vascular;  Laterality: Right;  . BACK SURGERY    . CHOLECYSTECTOMY    . DIALYSIS/PERMA CATHETER INSERTION N/A 01/22/2018   Procedure: DIALYSIS/PERMA CATHETER INSERTION;  Surgeon: Katha Cabal, MD;  Location: [place] [facility];  Service: Cardiovascular;  Laterality: N/A;  . DIALYSIS/PERMA CATHETER REMOVAL N/A 04/01/2018   Procedure: DIALYSIS/PERMA CATHETER REMOVAL;  Surgeon: Katha Cabal, MD;  Location: [place] [facility];  Service: Cardiovascular;  Laterality: N/A;  . ELECTROPHYSIOLOGIC STUDY  N/A 06/17/2015   Procedure: CARDIOVERSION;  Surgeon: Wellington Hampshire, MD;  Location: [facility] ORS;  Service: Cardiovascular;  Laterality: N/A;  . ELECTROPHYSIOLOGIC STUDY N/A 03/30/2016   Procedure: CARDIOVERSION;  Surgeon: Minna Merritts, MD;  Location: [facility] ORS;  Service: Cardiovascular;  Laterality: N/A;  . FLUOROSCOPY GUIDANCE Right 04/25/2018   Procedure: FLUOROSCOPY GUIDANCE;  Surgeon: Katha Cabal, MD;  Location:  [place] [facility];  Service: Cardiovascular;  Laterality: Right;  . GASTRIC BYPASS    . HEMORRHOID SURGERY    . I&D EXTREMITY Right 04/22/2018   Procedure: DEBRIDEMENT CALF;  Surgeon: Katha Cabal, MD;  Location: [facility] ORS;  Service: Vascular;  Laterality: Right;  . LOWER EXTREMITY ANGIOGRAPHY Right 01/23/2018   Procedure: LOWER EXTREMITY ANGIOGRAPHY;  Surgeon: Algernon Huxley, MD;  Location: [place] CV LAB;  Service: Cardiovascular;  Laterality: Right;  . LOWER EXTREMITY ANGIOGRAPHY Left 01/30/2018   Procedure: LOWER EXTREMITY ANGIOGRAPHY;  Surgeon: Algernon Huxley, MD;  Location: [place] [facility];  Service: Cardiovascular;  Laterality: Left;  . PERIPHERAL VASCULAR BALLOON ANGIOPLASTY Right 04/09/2018   Procedure: PERIPHERAL VASCULAR BALLOON ANGIOPLASTY;  Surgeon: Katha Cabal, MD;  Location: [place] [facility];  Service: Cardiovascular;  Laterality: Right;  . pilonidal cyst    . TEE WITHOUT CARDIOVERSION N/A 06/17/2015   Procedure: TRANSESOPHAGEAL ECHOCARDIOGRAM (TEE);  Surgeon: Wellington Hampshire, MD;  Location: [facility] ORS;  Service: Cardiovascular;  Laterality: N/A;    Allergies Aspirin; Tuberculin tests; and Codeine  Social History Social History   Tobacco Use  . Smoking status: Former Smoker    Last attempt to quit: 12/19/1940    Years since quitting: 77.4  . Smokeless tobacco: Former Systems developer    Types: Chew    Quit date: 11/19/2014  Substance Use Topics  . Alcohol use: No  . Drug use: No   Review of Systems Constitutional: Negative for fever. Cardiovascular: Negative for chest pain. Respiratory: Negative for shortness of breath. Gastrointestinal: Positive for rectal bleeding Musculoskeletal: Negative for back pain. Skin: Positive for skin ulcerations Neurological: Negative for headaches, positive for weakness  All systems negative/normal/unremarkable except as stated in the HPI  ____________________________________________   PHYSICAL EXAM:  VITAL  SIGNS: ED Triage Vitals  Enc Vitals Group     BP 05/14/18 1934 (!) 69/51     Pulse Rate 05/14/18 1934 68     Resp 05/14/18 1934 20     Temp 05/14/18 1934 98.2 F (36.8 C)     Temp Source 05/14/18 1934 Oral     SpO2 05/14/18 1934 93 %     Weight 05/14/18 1929 160 lb (72.6 kg)     Height 05/14/18 1929 5\' 8"  (1.727 m)     Head Circumference --      Peak Flow --      Pain Score 05/14/18 1927 10     Pain Loc --      Pain Edu? --      Excl. in [place]? --    Constitutional: Alert, chronically ill-appearing, no distress Eyes: Conjunctivae are normal. Normal extraocular movements. ENT      Head: Normocephalic and atraumatic.      Nose: No congestion/rhinnorhea.      Mouth/Throat: Mucous membranes are moist.      Neck: No stridor. Cardiovascular: Normal rate, regular rhythm. No murmurs, rubs, or gallops. Respiratory: Normal respiratory effort without tachypnea nor retractions. Breath sounds are clear and equal bilaterally. No wheezes/rales/rhonchi. Gastrointestinal: Soft and nontender. Normal bowel sounds Rectal: Dark  rectal bleeding is noted Musculoskeletal: Nontender with normal range of motion in extremities. No lower extremity tenderness nor edema. Neurologic:  Normal speech and language. No gross focal neurologic deficits are appreciated.  Skin: Wound VAC is present on his right lower leg laterally, ulcerations are appreciated on the left leg, sacral decubitus is noted Psychiatric: Mood and affect are normal. Speech and behavior are normal.  ____________________________________________  EKG: Interpreted by me.  Atrial fibrillation with a rate of 87 bpm, PVCs, wide QRS, PVCs, normal axis  ____________________________________________  ED COURSE:  As part of my medical decision making, I reviewed the following data within the [place] History obtained from family if available, nursing notes, old chart and ekg, as well as notes from prior ED visits. Patient presented  for rectal bleeding, we will assess with labs and imaging as indicated at this time.   Procedures ____________________________________________   LABS (pertinent positives/negatives)  Labs Reviewed  CBC WITH DIFFERENTIAL/PLATELET - Abnormal; Notable for the following components:      Result Value   WBC 15.2 (*)    RBC 3.85 (*)    Hemoglobin 10.8 (*)    HCT 34.9 (*)    RDW 17.0 (*)    Neutro Abs 12.7 (*)    Abs Immature Granulocytes 0.08 (*)    All other components within normal limits  COMPREHENSIVE METABOLIC PANEL - Abnormal; Notable for the following components:   Sodium 132 (*)    Potassium 7.1 (*)    CO2 11 (*)    BUN 116 (*)    Creatinine, Ser 5.90 (*)    Calcium 6.2 (*)    Total Protein 6.2 (*)    Albumin 2.5 (*)    AST 83 (*)    ALT 129 (*)    Alkaline Phosphatase 266 (*)    GFR calc non Af Amer 8 (*)    GFR calc Af Amer 10 (*)    Anion gap 20 (*)    All other components within normal limits  LIPASE, BLOOD - Abnormal; Notable for the following components:   Lipase 57 (*)    All other components within normal limits  TROPONIN I - Abnormal; Notable for the following components:   Troponin I 0.19 (*)    All other components within normal limits  PROTIME-INR - Abnormal; Notable for the following components:   Prothrombin Time 27.7 (*)    All other components within normal limits  LACTIC ACID, PLASMA - Abnormal; Notable for the following components:   Lactic Acid, Venous 4.6 (*)    All other components within normal limits  URINALYSIS, COMPLETE (UACMP) WITH MICROSCOPIC  TYPE AND SCREEN  PREPARE RBC (CROSSMATCH)   CRITICAL CARE Performed by: Laurence Aly   Total critical care time: 30 minutes  Critical care time was exclusive of separately billable procedures and treating other patients.  Critical care was necessary to treat or prevent imminent or life-threatening deterioration.  Critical care was time spent personally by me on the following  activities: development of treatment plan with patient and/or surrogate as well as nursing, discussions with consultants, evaluation of patient's response to treatment, examination of patient, obtaining history from patient or surrogate, ordering and performing treatments and interventions, ordering and review of laboratory studies, ordering and review of radiographic studies, pulse oximetry and re-evaluation of patient's condition.  ____________________________________________   DIFFERENTIAL DIAGNOSIS   Gastrointestinal bleeding, esophageal varices, medication side effect, dehydration, electro abnormality, anemia, renal failure  FINAL ASSESSMENT AND PLAN  Gastrointestinal bleeding, hyperkalemia, acute renal failure, hypocalcemia, lactic acidosis   Plan: The patient had presented for gastrointestinal bleeding and reported hypoxia. Patient's labs revealed numerous abnormalities including severe hyperkalemia, acute renal failure, hypocalcemia, lactic acidosis and elevated troponin.  Patient states he has not eaten or much to drink in the last week.  He is likely profoundly dehydrated.  Initially we gave him emergency release blood because he was hypotensive with active GI bleeding.  His hemoglobin did not appear to be as low as expected.  We are giving him IV fluids.  Initially for his renal failure and hyperkalemia he was given insulin, D50, Kayexalate, sodium bicarb and Veltassa.  I have discussed with nephrology.  Foley catheter was recommended, I will discuss with the hospitalist for ICU admission.   Laurence Aly, MD    Note: This note was generated in part or whole with voice recognition software. Voice recognition is usually quite accurate but there are transcription errors that can and very often do occur. I apologize for any typographical errors that were not detected and corrected.     Earleen Newport, MD 05/14/18 2149

## 2018-05-14 NOTE — ED Notes (Signed)
ED Provider at bedside. 

## 2018-05-14 NOTE — ED Notes (Signed)
Pt alert  Family with pt 

## 2018-05-14 NOTE — ED Notes (Signed)
prbc insfuing.   afib on monitor.  Family with pt.  Pt alert.

## 2018-05-14 NOTE — ED Notes (Signed)
Pt signed consent for blood.

## 2018-05-15 ENCOUNTER — Inpatient Hospital Stay (HOSPITAL_COMMUNITY)
Admit: 2018-05-15 | Discharge: 2018-05-15 | Disposition: A | Discharge status: Home / Self Care | Payer: Medicare Other | Attending: Pulmonary Disease | Admitting: Pulmonary Disease

## 2018-05-15 ENCOUNTER — Inpatient Hospital Stay: Payer: Medicare Other

## 2018-05-15 DIAGNOSIS — K625 Hemorrhage of anus and rectum: Secondary | ICD-10-CM

## 2018-05-15 DIAGNOSIS — I361 Nonrheumatic tricuspid (valve) insufficiency: Secondary | ICD-10-CM

## 2018-05-15 DIAGNOSIS — A419 Sepsis, unspecified organism: Secondary | ICD-10-CM

## 2018-05-15 DIAGNOSIS — I340 Nonrheumatic mitral (valve) insufficiency: Secondary | ICD-10-CM

## 2018-05-15 DIAGNOSIS — N179 Acute kidney failure, unspecified: Secondary | ICD-10-CM

## 2018-05-15 DIAGNOSIS — R748 Abnormal levels of other serum enzymes: Secondary | ICD-10-CM

## 2018-05-15 DIAGNOSIS — Z7189 Other specified counseling: Secondary | ICD-10-CM

## 2018-05-15 DIAGNOSIS — I5042 Chronic combined systolic (congestive) and diastolic (congestive) heart failure: Secondary | ICD-10-CM

## 2018-05-15 DIAGNOSIS — N189 Chronic kidney disease, unspecified: Secondary | ICD-10-CM

## 2018-05-15 DIAGNOSIS — Z515 Encounter for palliative care: Secondary | ICD-10-CM

## 2018-05-15 LAB — BASIC METABOLIC PANEL
Anion gap: 21 — ABNORMAL HIGH (ref 5–15)
Anion gap: 19 — ABNORMAL HIGH (ref 5–15)
BUN: 121 mg/dL — ABNORMAL HIGH (ref 8–23)
BUN: 112 mg/dL — ABNORMAL HIGH (ref 8–23)
CO2: 11 mmol/L — ABNORMAL LOW (ref 22–32)
CO2: 12 mmol/L — ABNORMAL LOW (ref 22–32)
CREATININE: 5.65 mg/dL — AB (ref 0.61–1.24)
Calcium: 6.1 mg/dL — CL (ref 8.9–10.3)
Calcium: 5.9 mg/dL — CL (ref 8.9–10.3)
Chloride: 102 mmol/L (ref 98–111)
Chloride: 101 mmol/L (ref 98–111)
Creatinine, Ser: 5.84 mg/dL — ABNORMAL HIGH (ref 0.61–1.24)
GFR calc Af Amer: 10 mL/min — ABNORMAL LOW (ref >60)
GFR calc Af Amer: 10 mL/min — ABNORMAL LOW (ref >60)
GFR calc non Af Amer: 8 mL/min — ABNORMAL LOW (ref >60)
GFR, EST NON AFRICAN AMERICAN: 9 mL/min — AB (ref >60)
Glucose, Bld: 127 mg/dL — ABNORMAL HIGH (ref 70–99)
Glucose, Bld: 177 mg/dL — ABNORMAL HIGH (ref 70–99)
Potassium: 5.2 mmol/L — ABNORMAL HIGH (ref 3.5–5.1)
Potassium: 4.8 mmol/L (ref 3.5–5.1)
Sodium: 134 mmol/L — ABNORMAL LOW (ref 135–145)
Sodium: 132 mmol/L — ABNORMAL LOW (ref 135–145)

## 2018-05-15 LAB — COMPREHENSIVE METABOLIC PANEL
ALT: 125 U/L — ABNORMAL HIGH (ref 0–44)
AST: 79 U/L — ABNORMAL HIGH (ref 15–41)
Albumin: 2.5 g/dL — ABNORMAL LOW (ref 3.5–5.0)
Alkaline Phosphatase: 255 U/L — ABNORMAL HIGH (ref 38–126)
Anion gap: 19 — ABNORMAL HIGH (ref 5–15)
BUN: 116 mg/dL — ABNORMAL HIGH (ref 8–23)
CO2: 14 mmol/L — ABNORMAL LOW (ref 22–32)
Calcium: 6.3 mg/dL — CL (ref 8.9–10.3)
Chloride: 101 mmol/L (ref 98–111)
Creatinine, Ser: 5.78 mg/dL — ABNORMAL HIGH (ref 0.61–1.24)
GFR calc Af Amer: 10 mL/min — ABNORMAL LOW (ref >60)
GFR calc non Af Amer: 9 mL/min — ABNORMAL LOW (ref >60)
Glucose, Bld: 83 mg/dL (ref 70–99)
POTASSIUM: 6 mmol/L — AB (ref 3.5–5.1)
SODIUM: 134 mmol/L — AB (ref 135–145)
Total Bilirubin: 0.8 mg/dL (ref 0.3–1.2)
Total Protein: 6.6 g/dL (ref 6.5–8.1)

## 2018-05-15 LAB — TROPONIN I
Troponin I: 0.19 ng/mL (ref <0.03)
Troponin I: 0.18 ng/mL (ref <0.03)

## 2018-05-15 LAB — GLUCOSE, CAPILLARY
GLUCOSE-CAPILLARY: 62 mg/dL — AB (ref 70–99)
GLUCOSE-CAPILLARY: 84 mg/dL (ref 70–99)
Glucose-Capillary: 116 mg/dL — ABNORMAL HIGH (ref 70–99)
Glucose-Capillary: 89 mg/dL (ref 70–99)
Glucose-Capillary: 150 mg/dL — ABNORMAL HIGH (ref 70–99)
Glucose-Capillary: 117 mg/dL — ABNORMAL HIGH (ref 70–99)

## 2018-05-15 LAB — LACTIC ACID, PLASMA
Lactic Acid, Venous: 2.4 mmol/L (ref 0.5–1.9)
Lactic Acid, Venous: 2.6 mmol/L (ref 0.5–1.9)

## 2018-05-15 LAB — CBC
HCT: 36.9 % — ABNORMAL LOW (ref 39.0–52.0)
HEMOGLOBIN: 11.9 g/dL — AB (ref 13.0–17.0)
MCH: 28.5 pg (ref 26.0–34.0)
MCHC: 32.2 g/dL (ref 30.0–36.0)
MCV: 88.3 fL (ref 80.0–100.0)
Platelets: 288 10*3/uL (ref 150–400)
RBC: 4.18 MIL/uL — ABNORMAL LOW (ref 4.22–5.81)
RDW: 16.7 % — ABNORMAL HIGH (ref 11.5–15.5)
WBC: 14.9 10*3/uL — ABNORMAL HIGH (ref 4.0–10.5)
nRBC: 0 % (ref 0.0–0.2)

## 2018-05-15 LAB — ECHOCARDIOGRAM COMPLETE
Height: 68 in
Weight: 2539.7 oz

## 2018-05-15 LAB — PROCALCITONIN: Procalcitonin: 1.15 ng/mL

## 2018-05-15 LAB — MRSA PCR SCREENING: MRSA by PCR: NEGATIVE

## 2018-05-15 MED ORDER — SODIUM BICARBONATE 8.4 % IV SOLN
INTRAVENOUS | Status: DC
  Administered 2018-05-15 – 2018-05-17 (×3): via INTRAVENOUS
  Filled 2018-05-15 (×4): qty 150

## 2018-05-15 MED ORDER — CALCIUM GLUCONATE-NACL 1-0.675 GM/50ML-% IV SOLN
1.0000 g | Freq: Once | INTRAVENOUS | Status: AC
  Administered 2018-05-15: 1000 mg via INTRAVENOUS
  Filled 2018-05-15: qty 50

## 2018-05-15 MED ORDER — PANTOPRAZOLE SODIUM 40 MG IV SOLR
40.0000 mg | Freq: Two times a day (BID) | INTRAVENOUS | Status: DC
  Administered 2018-05-15 – 2018-05-17 (×5): 40 mg via INTRAVENOUS
  Filled 2018-05-15 (×5): qty 40

## 2018-05-15 MED ORDER — SODIUM CHLORIDE 0.9 % IV SOLN
1.0000 g | INTRAVENOUS | Status: DC
  Administered 2018-05-15: 1 g via INTRAVENOUS
  Filled 2018-05-15 (×2): qty 1

## 2018-05-15 MED ORDER — COLLAGENASE 250 UNIT/GM EX OINT
TOPICAL_OINTMENT | Freq: Every day | CUTANEOUS | Status: DC
  Administered 2018-05-15 – 2018-05-16 (×2): via TOPICAL
  Filled 2018-05-15: qty 30

## 2018-05-15 MED ORDER — DEXTROSE 50 % IV SOLN
INTRAVENOUS | Status: AC
  Administered 2018-05-15: 25 g
  Filled 2018-05-15: qty 50

## 2018-05-15 MED ORDER — VITAMIN C 500 MG PO TABS
250.0000 mg | ORAL_TABLET | Freq: Two times a day (BID) | ORAL | Status: DC
  Administered 2018-05-15 – 2018-05-17 (×5): 250 mg via ORAL
  Filled 2018-05-15: qty 1
  Filled 2018-05-15 (×2): qty 0.5
  Filled 2018-05-15: qty 1
  Filled 2018-05-15: qty 0.5
  Filled 2018-05-15: qty 1
  Filled 2018-05-15: qty 0.5

## 2018-05-15 MED ORDER — SODIUM POLYSTYRENE SULFONATE 15 GM/60ML PO SUSP
45.0000 g | Freq: Once | ORAL | Status: AC
  Administered 2018-05-15: 45 g via ORAL
  Filled 2018-05-15: qty 180

## 2018-05-15 MED ORDER — SODIUM CHLORIDE 0.9 % IV SOLN
1.0000 g | INTRAVENOUS | Status: DC
  Administered 2018-05-15 – 2018-05-16 (×2): 1 g via INTRAVENOUS
  Filled 2018-05-15: qty 10
  Filled 2018-05-15 (×2): qty 1

## 2018-05-15 MED ORDER — FENTANYL CITRATE (PF) 100 MCG/2ML IJ SOLN
25.0000 ug | INTRAMUSCULAR | Status: DC | PRN
  Administered 2018-05-15: 25 ug via INTRAVENOUS
  Filled 2018-05-15: qty 2

## 2018-05-15 MED ORDER — DEXTROSE 50 % IV SOLN
1.0000 | Freq: Once | INTRAVENOUS | Status: AC
  Administered 2018-05-15: 50 mL via INTRAVENOUS
  Filled 2018-05-15: qty 50

## 2018-05-15 MED ORDER — SODIUM CHLORIDE 0.9 % IV BOLUS
500.0000 mL | Freq: Once | INTRAVENOUS | Status: AC
  Administered 2018-05-15: 500 mL via INTRAVENOUS

## 2018-05-15 MED ORDER — VANCOMYCIN HCL IN DEXTROSE 1-5 GM/200ML-% IV SOLN: 1000.0000 mg | INTRAVENOUS | Status: DC | PRN

## 2018-05-15 MED ORDER — JUVEN PO PACK
1.0000 | PACK | Freq: Two times a day (BID) | ORAL | Status: DC
  Administered 2018-05-17: 1 via ORAL

## 2018-05-15 MED ORDER — INSULIN REGULAR HUMAN 100 UNIT/ML IJ SOLN
10.0000 [IU] | Freq: Once | INTRAMUSCULAR | Status: AC
  Administered 2018-05-15: 10 [IU] via INTRAVENOUS
  Filled 2018-05-15: qty 10

## 2018-05-15 MED ORDER — VANCOMYCIN HCL IN DEXTROSE 1-5 GM/200ML-% IV SOLN
1000.0000 mg | Freq: Once | INTRAVENOUS | Status: AC
  Administered 2018-05-15: 1000 mg via INTRAVENOUS
  Filled 2018-05-15: qty 200

## 2018-05-15 MED ORDER — SODIUM BICARBONATE 8.4 % IV SOLN
50.0000 meq | Freq: Once | INTRAVENOUS | Status: AC
  Administered 2018-05-15: 50 meq via INTRAVENOUS
  Filled 2018-05-15: qty 50

## 2018-05-15 NOTE — Progress Notes (Signed)
Dr Abigail Butts notified that patient's potassium is 5.2  And calcium is 6.1.  MD gave order to check bmp at 1400.

## 2018-05-15 NOTE — Progress Notes (Signed)
Pharmacy Antibiotic Note  Gregory Tyler is a 78 y.o. male admitted on 05/14/2018 with UTI.  Pharmacy has been consulted for vancomycin and cefepime dosing.  Plan: DW 73 kg  Vd 51L kei 0.013 hr-1  T1/2 53 hours Vancomycin 1 gram x1 ordered. Will dose by serial levels until renal function stabilizes.  Cefepime 1 gram q 24 hours ordered  Height: 5\' 8"  (172.7 cm) Weight: 160 lb (72.6 kg) IBW/kg (Calculated) : 68.4  Temp (24hrs), Avg:98 F (36.7 C), Min:97.3 F (36.3 C), Max:98.2 F (36.8 C)  Recent Labs  Lab 05/14/18 1931 05/14/18 1946  WBC 15.2*  --   CREATININE 5.90*  --   LATICACIDVEN  --  4.6*    Estimated Creatinine Clearance: 10 mL/min (A) (by C-G formula based on SCr of 5.9 mg/dL (H)).    Allergies  Allergen Reactions  . Aspirin Other (See Comments)    GI Bleeding  . Tuberculin Tests Other (See Comments)    unknown  . Codeine Other (See Comments)    BLISTERS     Antimicrobials this admission: Ceftriaxone x1; vancomycin, cefepime 1/23  >>    >>   Dose adjustments this admission:   Microbiology results: 1/23 BCx:  pending  1/22 UCx: pending  1/23 MRSA PCR: pending      1/22 UA: LE(+) NO2 (-)  WBC >50 1/22 CXR: L retrocardiac opacity Thank you for allowing pharmacy to be a part of this patient's care.   Sim Boast, PharmD, BCPS  05/15/18 1:48 AM

## 2018-05-15 NOTE — Progress Notes (Signed)
Lab notified RN of critical calcium of 5.9, adjusted for albumin of 2.5 is 8.9.  NP, Dana notified, no new order.

## 2018-05-15 NOTE — ED Notes (Signed)
Report called to tess rn icu nurse

## 2018-05-15 NOTE — Progress Notes (Signed)
Initial Nutrition Assessment  DOCUMENTATION CODES:   Severe malnutrition in context of chronic illness  INTERVENTION:  Provide Magic cup TID with meals, each supplement provides 290 kcal and 9 grams of protein. Patient prefers vanilla (states too much chocolate gives him diarrhea).  Provide Juven po BID between meals, each supplement provides 95 kcal, 2.5 grams collagen, 14 grams amino acids, and vitamins/minerals essential for wound healing.  Provide vitamin C 250 mg BID.  NUTRITION DIAGNOSIS:   Severe Malnutrition related to chronic illness(s/p gastric bypass, emphysema, CHF, CKD) as evidenced by severe fat depletion, severe muscle depletion.  GOAL:   Patient will meet greater than or equal to 90% of their needs  MONITOR:   PO intake, Supplement acceptance, Labs, Weight trends, Skin, I & O's  REASON FOR ASSESSMENT:   Rounds    ASSESSMENT:   78 year old male with PMHx of DM, HTN, emphysema of lung, HLD, gout, arthritis, CAD, hx nephrolithiasis, anxiety, depression, chronic combined systolic and diastolic heart failure, cardiomyopathy, paroxysmal A-fib, stage III CKD, hx gastric bypass who is admitted with rectal bleeding, AKI, hyperkalemia, hypovolemic shock secondary to dehydration, severe sepsis secondary to UTI, also with atelectasis vs HCAP vs aspiration PNA.   Met with patient at bedside. He has several large wounds and has been followed by wound clinic monthly outpatient. He had negative pressure dressing to right posterior leg, but this was removed on admission as the machine was left at the facility. Patient is not able to provide a very good history today. He is tired and kept falling asleep during assessment. He is only able to report that he does not have a good appetite. He only ate 25% of his lunch today. He does not like any oral nutrition supplements. After discussion he is willing to try Magic Cup vanilla and Juven (currently have the fruit punch flavor). Discussed  importance of meeting adequate calorie/protein needs for wound healing. Patient unable to provide any details on the gastric bypass in his surgical history and there are also no notes in chart. On CT Abd/Pelvis from 11/15/2017, report just says patient is s/p gastric bypass.  Meal Completion: 25% of lunch today  Medications reviewed and include: Novolog 0-9 units TID, Novolot 0-5 unit sQHS, pantoprazole, cefepime, sodium bicarbonate in D5 at 50 mL/hr.  Labs reviewed: CBG 89-116, Sodium 134, Potassium 5.2, BUN 121, Creatinine 5.65, Calcium 6.3 (corrects to 7.5 with albumin of 2.5).  NUTRITION - FOCUSED PHYSICAL EXAM:    Most Recent Value  Orbital Region  Severe depletion  Upper Arm Region  Severe depletion  Thoracic and Lumbar Region  Moderate depletion  Buccal Region  Severe depletion  Temple Region  Severe depletion  Clavicle Bone Region  Severe depletion  Clavicle and Acromion Bone Region  Severe depletion  Scapular Bone Region  Unable to assess  Dorsal Hand  Severe depletion  Patellar Region  Moderate depletion  Anterior Thigh Region  Severe depletion  Posterior Calf Region  Unable to assess  Edema (RD Assessment)  Mild  Hair  Reviewed  Eyes  Reviewed  Mouth  Reviewed  Skin  Reviewed  Nails  Reviewed     Diet Order:   Diet Order            Diet regular Room service appropriate? Yes; Fluid consistency: Thin  Diet effective now             EDUCATION NEEDS:   Not appropriate for education at this time  Skin:  Skin Assessment:  Skin Integrity Issues:(Full thickness wound right posterior leg (12cm x 5cm x 3cm); full thickness wound left posterior leg (8cm x 3cm x 2cm); unstageable PU left heel (2cm x 3cm); unstageable PU right heel (5cm x 4cm); unstageable PU sacrum (4cm x 3cm))   Last BM:  05/15/2018 - medium type 7  Height:   Ht Readings from Last 1 Encounters:  05/14/18 _0  (1.727 m)   Weight:   Wt Readings from Last 1 Encounters:  05/15/18 72 kg   Ideal Body  Weight:  70 kg  BMI:  Body mass index is 24.14 kg/m.  Estimated Nutritional Needs:   Kcal:  1850-2130 (MSJ x 1.3-1.5)  Protein:  85-100 grams (1.2-1.4 grams/kg)  Fluid:  1.8-2.1 L/day (1 mL/kcal)  Willey Blade, MS, RD, LDN Office: [phone] Pager: [phone] After Hours/Weekend Pager: [phone]

## 2018-05-15 NOTE — Consult Note (Signed)
Consultation Note Date: 05/15/2018   Patient Name: Gregory Tyler  DOB: 1941/02/24  MRN: [id number]  Age / Sex: 78 y.o., male  PCP: Gregory Crouch, MD Referring Physician: Otila Back, MD  Reason for Consultation: Establishing goals of care  HPI/Patient Profile: 78 y.o. male  with past medical history of CHF, COPD, T2DM, a fib, CAD, gastric ulcer, HTN, CKD, HLD, anemia, nephrolithiasis, prostate cancer, anxiety, and depression admitted on 05/14/2018 with concern for rectal bleeding and low oxygen. He reports 7-8 days of poor PO intake. He was found to be hypotensive on admission. Further work up revealed creatinine of 5.9, potassium of 7.1, troponin 0.19, lactic acid 4.6, and hgb 10.8. UA positive for UTI and CXR reveals pna vs atelectasis. Treatment started for severe sepsis secondary to UTI/?pna. Also being treated for AKI and hyperkalemia. Patient given 1 unit PRBC in ED, no further blood given after hgb found to be higher than previous admission at 10.8. Echocardiogram obtained with EF 50-55%. Per nephrology, patient has a history of AKI requiring HD. He had an HD catheter that patient tells me was recently removed. No acute indication for HD. Patient also has BLE and sacral pressure ulcers. Wound on right LE attached to wound vac. Patient has been living at [facility]. Patient has frequent hospitalizations. 6 in the past 6 months - PMT consulted for [facility].   Clinical Assessment and Goals of Care: I have reviewed medical records including EPIC notes, labs and imaging, received report from RN, assessed the patient and then met with the patient  to discuss diagnosis prognosis, GOC, EOL wishes, disposition and options.  I introduced Palliative Medicine as specialized medical care for people living with serious illness. It focuses on providing relief from the symptoms and stress of a serious illness. The goal is to improve quality of life for both  the patient and the family.  No family at bedside. Patient initially tells me he does not feel like talking and only wants to sleep. After introducing myself and telling him my hope to talk to him and his family about his healthcare he tells me "I'm not thinking right, you need to speak to my son". He tells me his son is his 41 and he wants him to make all of his medical decisions.   I asked him if he understood what was going on and he tells me he understands his kidneys aren't working well. I asked his thoughts on HD - he is familiar with HD as he has received it before - he tells me "that'd be fine with me".  He then shares that he has been thinking he is "ready to stop, I have no life worth living". I asked for him to elaborate and he tells me that he does not enjoy his life, does not feel that he has good quality of life. He talks about living in a nursing home, non-ambulatory, with frequent hospitalizations. I explained that his thoughts were not unreasonable and we could continue this discussion with his son. I explained we could discuss what his healthcare should look like moving forward - such as would he want to be hospitalized again? Would he like the support of hospice care? I encouraged him to share his thoughts on his quality of life and healthcare with his son.   He tells me his wife lives in a memory care unit. He has 2 other children - one son he has no contact with and one daughter who lives 2.5  hours away. I asked if he would like to include his daughter in our conversation tomorrow and he declines telling me he prefers it only be his son.   I called patient's son Gregory Tyler who is HCPOA - introduced palliative and discussed what I'd like to speak to him about with his father. Scheduled meeting for tomorrow (1/24) at 10:30 AM.  Questions and concerns were addressed. The family was encouraged to call with questions or concerns.   Primary Decision Maker PATIENT -  He has some periods of  confusions and defers medical decisions to his son, Gregory Tyler - patient tells me Gregory Tyler is his 17    SUMMARY OF RECOMMENDATIONS   - scheduled meeting with patient and son tomorrow (1/24) 10:30 AM - son is HCPOA - DNR - patient oriented with periods of slight confusion during our conversation - he tells me he is "okay" with dialysis if needed, but then also tells me "I think I'm ready to stop; I have no life worth living" - plan to continue this discussion with patient and son tomorrow regarding quality of life and what plan of care fits the patient's goals moving forward  Code Status/Advance Care Planning:  DNR  Symptom Management:   PRN tylenol hip pain  Palliative Prophylaxis:   Delirium Protocol, Frequent Pain Assessment and Turn Reposition  Additional Recommendations (Limitations, Scope, Preferences):  Full Scope Treatment  Psycho-social/Spiritual:   Desire for further Chaplaincy support:no  Additional Recommendations: Education on Hospice  Prognosis:   Unable to determine - poor prognosis r/t poor functional status, poor nutritional status, kidney disease, heart failure  Discharge Planning: To Be Determined      Primary Diagnoses: Present on Admission: . Acute renal failure superimposed on chronic kidney disease ([place]) . Chronic combined systolic and diastolic CHF (congestive heart failure) ([place]) . Coronary artery disease involving native coronary artery of native heart without angina pectoris . Dehydration . Hyperkalemia . Essential hypertension . Hyperlipidemia . Paroxysmal atrial fibrillation ([facility]) . Rectal bleed . Elevated troponin . UTI (urinary tract infection)   I have reviewed the medical record, interviewed the patient and family, and examined the patient. The following aspects are pertinent.  Past Medical History:  Diagnosis Date  . Anemia   . Anxiety   . Arthritis   . Cardiomyopathy ([place])    a. Presumed to be NICM in setting of AFib - EF  35-40% 05/2015.  Marland Kitchen Chronic combined systolic (congestive) and diastolic (congestive) heart failure ([place])    a. 05/2015 TEE: EF 35-40% (in setting of Afib); b. 08/2017 Echo: EF 55-60%, no rwma, Gr2DD; b. 01/2018 Echo: EF 55-60%, mild MR. Nl LA size. Nl RV fxn.  . Chronic Pelvic pain   . Coronary artery disease    a. 05/2015 CTA Chest: 3 vessel coronary Ca2+.  . Depression   . Diabetes mellitus without complication ([place])   . Emphysema lung ([place])   . Gastric ulcer   . Gout   . Hyperlipidemia   . Hypertension   . Nephrolithiasis   . PAF (paroxysmal atrial fibrillation) ([place])    a. 05/2015 s/p DCCV; b. 03/2016 s/p DCCV; c. CHA2DS2VASc = 6 ([facility]).  . Prostate cancer [facility])    Prostate  . Stage III chronic kidney disease ([place])   . Weight loss    50 lb since Apr 24, 2015   Social History   Socioeconomic History  . Marital status: Married    Spouse name: Not on file  . Number of children: Not on  file  . Years of education: Not on file  . Highest education level: Not on file  Occupational History  . Occupation: retired  Scientific laboratory technician  . Financial resource strain: Not on file  . Food insecurity:    Worry: Patient refused    Inability: Patient refused  . Transportation needs:    Medical: Patient refused    Non-medical: Patient refused  Tobacco Use  . Smoking status: Former Smoker    Last attempt to quit: 12/19/1940    Years since quitting: 77.4  . Smokeless tobacco: Former Systems developer    Types: [place] date: 11/19/2014  Substance and Sexual Activity  . Alcohol use: No  . Drug use: No  . Sexual activity: Not Currently  Lifestyle  . Physical activity:    Days per week: 0 days    Minutes per session: 0 min  . Stress: Patient refused  Relationships  . Social connections:    Talks on phone: More than three times a week    Gets together: Twice a week    Attends religious service: Not on file    Active member of club or organization: No    Attends meetings of clubs or organizations:  Never    Relationship status: Married  Other Topics Concern  . Not on file  Social History Narrative   Retired.  Lives iwith son   Family History  Problem Relation Age of Onset  . CAD Mother   . Stroke Mother   . COPD Mother   . Heart failure Mother   . Diabetes Other    Scheduled Meds: . collagenase   Topical Daily  . insulin aspart  0-5 Units Subcutaneous QHS  . insulin aspart  0-9 Units Subcutaneous TID WC  . pantoprazole (PROTONIX) IV  40 mg Intravenous Q12H  . rosuvastatin  20 mg Oral QHS  . tiotropium  18 mcg Inhalation Daily   Continuous Infusions: . sodium chloride    . ceFEPime (MAXIPIME) IV Stopped (05/15/18 0334)  .  sodium bicarbonate  infusion 1000 mL 50 mL/hr at 05/15/18 1104   PRN Meds:.acetaminophen **OR** acetaminophen, albuterol, fentaNYL (SUBLIMAZE) injection, [DISCONTINUED] ondansetron **OR** ondansetron (ZOFRAN) IV Allergies  Allergen Reactions  . Aspirin Other (See Comments)    GI Bleeding  . Tuberculin Tests Other (See Comments)    unknown  . Codeine Other (See Comments)    BLISTERS    Review of Systems  Constitutional: Positive for activity change, appetite change and fatigue.  Musculoskeletal: Positive for arthralgias.  Skin: Positive for wound.  Neurological: Positive for weakness.    Physical Exam Constitutional:      Appearance: He is underweight. He is ill-appearing.  HENT:     Head: Normocephalic and atraumatic.  Cardiovascular:     Rate and Rhythm: Tachycardia present. Rhythm irregular.  Pulmonary:     Effort: Pulmonary effort is normal.     Breath sounds: Normal breath sounds.  Musculoskeletal:     Comments: Wound on BLE attached to wound vac with sanguinous drainage, pressure injuries covered with dressings on bilateral heels  Neurological:     Mental Status: He is alert and oriented to person, place, and time.     Comments: Mild confusion at times, easily redirected     Vital Signs: BP 97/84   Pulse (!) 124   Temp (!)  97.5 F (36.4 C) (Oral)   Resp 20   Ht 5' 8"  (1.727 m)   Wt 72 kg   SpO2  100%   BMI 24.14 kg/m  Pain Scale: 0-10   Pain Score: 5    SpO2: SpO2: 100 % O2 Device:SpO2: 100 % O2 Flow Rate: .O2 Flow Rate (L/min): 2 L/min  IO: Intake/output summary:   Intake/Output Summary (Last 24 hours) at 05/15/2018 1332 Last data filed at 05/15/2018 1104 Gross per 24 hour  Intake 2809.97 ml  Output 350 ml  Net 2459.97 ml    LBM: Last BM Date: 05/15/18 Baseline Weight: Weight: 72.6 kg Most recent weight: Weight: 72 kg     Palliative Assessment/Data: PPS 30%    Time Total: 80 minutes Greater than 50%  of this time was spent counseling and coordinating care related to the above assessment and plan.  Juel Burrow, DNP, AGNP-C Palliative Medicine Team [phone] Pager: [phone]

## 2018-05-15 NOTE — Consult Note (Addendum)
[facility] Nurse wound consult note Reason for Consult: Consult requested for bilat legs, bilat heels, and sacrum.  Pt had an unknown vendor of negative pressure device applied to right posterior leg prior to admission, and states he has been followed by the outpatient wound center monthly. Wound type: Removed previous negative pressure dressing (machine was left at the facility, according to the patient)  and applied one piece black foam to cont suction at 170mm and Vac machine connected with good seal.  Pt was medicated prior to the procedure and tolerated with mod amt discomfort. Full thickness wound to right posterior leg 12X5X.3cm, 90% red, 10% slough/eschar at wound edges, small amt blood-tinged drainage. Left posterior leg full thickness wound is 100% soft eschar, small amt tan drainage, strong odor,  8X3X.2cm Left heel with unstageable pressure injury; 2X3cm, dry eschar without odor, drainage, or fluctuance Right heel with unstageable pressure injury; 5X4cm, dry eschar without odor, drainage, or fluctuance Sacrum with unstageable pressure injury; 4X3 cm, 100% slough/eschar, surrounded by 3 cm dark reddish-purple deep tissure pressure injury Pressure Injury POA: Yes Dressing procedure/placement/frequency: Pt is on a low airloss bed to reduce pressure and heels are floated.  It is best practice to leave dry stable eschar intact to bilat heels; no topical treatment is indicated at this time and they are protected by foam heel cups.   Santyl ointment to provide enzymatic debridement to sacrum and left posterior leg wound.  Pt should follow-up with the outpatient wound care center after discharge; discussed plan of care with patient and son at the bedside.    [facility] team will plan to change leg Vac dressing on Monday and resume a M/W/F schedule for dressing changes, if patient is still in the hospital at that time.  Julien Girt MSN, RN, [place], [place], [place]

## 2018-05-15 NOTE — Progress Notes (Signed)
[facility] [place] NAME: Gregory Tyler    MR#:  [id number]  DATE OF BIRTH:  06/03/1940  SUBJECTIVE:  CHIEF COMPLAINT:   Chief Complaint  Patient presents with  . Rectal Bleeding   No new complaint reported this morning.  No further bleeding reported. REVIEW OF SYSTEMS:  ROS  Unobtainable at this time.  Patient currently resting comfortably .  Updated son at bedside.  DRUG ALLERGIES:   Allergies  Allergen Reactions  . Aspirin Other (See Comments)    GI Bleeding  . Tuberculin Tests Other (See Comments)    unknown  . Codeine Other (See Comments)    BLISTERS    VITALS:  Blood pressure 108/68, pulse (!) 109, temperature (!) 97.5 F (36.4 C), temperature source Oral, resp. rate 11, height 5\' 8"  (1.727 m), weight 72 kg, SpO2 100 %. PHYSICAL EXAMINATION:   Physical Exam  Constitutional: He is well-developed, well-nourished, and in no distress.  HENT:  Head: Normocephalic and atraumatic.  Eyes: Pupils are equal, round, and reactive to light. Conjunctivae are normal. Right eye exhibits no discharge.  Neck: Normal range of motion. Neck supple. No tracheal deviation present.  Cardiovascular: Normal rate, regular rhythm and normal heart sounds.  Pulmonary/Chest: Effort normal. He has rales.  Abdominal: Soft. Bowel sounds are normal. There is no abdominal tenderness.  Musculoskeletal: Normal range of motion.        General: No edema.  Neurological:  Patient resting comfortably.  Full neuro exam not done  Skin: Skin is warm. He is not diaphoretic. No erythema.   LABORATORY PANEL:  Male CBC Recent Labs  Lab 05/15/18 0304  WBC 14.9*  HGB 11.9*  HCT 36.9*  PLT 288   ------------------------------------------------------------------------------------------------------------------ Chemistries  Recent Labs  Lab 05/14/18 1931 05/15/18 0304 05/15/18 0755  NA 132* 134* 134*  K 7.1* 6.0* 5.2*  CL 101 101 102  CO2 11* 14*  11*  GLUCOSE 82 83 127*  BUN 116* 116* 121*  CREATININE 5.90* 5.78* 5.65*  CALCIUM 6.2* 6.3* 6.1*  MG 2.1  --   --   AST 83* 79*  --   ALT 129* 125*  --   ALKPHOS 266* 255*  --   BILITOT 0.6 0.8  --    RADIOLOGY:  US Abdomen Complete  Result Date: 05/15/2018 CLINICAL DATA:  Elevated LFTs, history CHF, coronary artery disease, cardiomyopathy, diabetes mellitus, hypertension, stage III chronic kidney disease, atrial fibrillation, nephrolithiasis EXAM: ABDOMEN ULTRASOUND COMPLETE COMPARISON:  Renal and abdominal ultrasound exams of 01/14/2018 FINDINGS: Gallbladder: Surgically absent Common bile duct: Diameter: 4 mm diameter, normal Liver: Upper normal echogenicity. No mass or nodularity. Portal vein is patent on color Doppler imaging with normal direction of blood flow towards the liver. IVC: Normal appearance Pancreas: Inadequately visualized due to bowel gas Spleen: Normal appearance, 4.2 cm length Right Kidney: Length: 9.8 cm. Normal cortical thickness. Increased cortical echogenicity. No mass or hydronephrosis. Left Kidney: Length: 9.7 cm. Normal cortical thickness. Increased cortical echogenicity. No mass or hydronephrosis. 7 mm mildly shadowing echogenic focus at mid LEFT kidney likely small nonobstructing calculus. Abdominal aorta: Atherosclerotic changes of aorta without aneurysm. Other findings: Small BILATERAL pleural effusions.  No ascites. IMPRESSION: Post cholecystectomy. Probable nonobstructing 7 mm mid LEFT renal calculus. Medical renal disease changes of both kidneys. BILATERAL pleural effusions. Electronically Signed   By: Lavonia Dana M.D.   On: 05/15/2018 10:34   Dg Chest Port 1 View  Result Date: 05/14/2018 CLINICAL DATA:  Rectal bleeding low oxygen saturation EXAM: PORTABLE CHEST 1 VIEW COMPARISON:  04/07/2018 FINDINGS: Retrocardiac opacity on the left. No pleural effusion. Stable cardiomediastinal silhouette. No pneumothorax. IMPRESSION: Left retrocardiac opacity may reflect  atelectasis, pneumonia, or aspiration. Electronically Signed   By: Donavan Foil M.D.   On: 05/14/2018 22:36   ASSESSMENT AND PLAN:    1.  UTI (urinary tract infection) - Patient already started on IV antibiotics.currently on cefepime pending results of urine culture and sensitivities.   IV fluid hydration   2.  Severe sepsis secondary to UTI and probable aspiration pneumonia Continue IV fluids and antibiotics.  Patient currently on IV cefepime.  Noted to have been given a dose of vancomycin in the emergency room  3.  Acute kidney injury and hyperkalemia and metabolic acidosis Hyperkalemia already improving with potassium level down to 5.2 from 7.1. Nephrology service following and managing.  4.  Lower GI bleed No further evidence of GI bleeding.  Hemoglobin remained stable.  Eliquis on hold Seen by gastroenterologist.  Faythe Ghee to continue with IV Protonix 40 mg twice daily recommendation was for conservative management from endoscopic standpoint at this time.  If patient has active signs of GI bleed that Lysbeth Galas be managed medically with PPI and blood transfusion, GI procedure will be considered at that time.  5.   Elevated troponin of 0.19.   History of Coronary artery disease involving native coronary artery of native heart without angina pectoris continue home medications 2D echocardiogram done with ejection fraction of 50 to 55%.  Regional wall motion abnormalities cannot be excluded.  Patient already scheduled for meeting with palliative care team and his son tomorrow.  Follow-up on their decision regarding goals of care going forward  6.  Essential hypertension - Blood pressure medications were placed on hold due to low blood pressure on presentation  Blood pressure now trending up.  Monitor and resume home regimen if blood pressure continues to trend up  7.  Paroxysmal atrial fibrillation Rate controlled. Anticoagulation with Eliquis placed on hold due to presentation with lower GI  bleed  8.  Diabetes mellitus type 2 Metformin on hold due to acute kidney injury Sliding scale insulin coverage.  Monitor blood sugars closely  9.  Multiple comorbidities mentioned above. Patient seen by palliative care team.  Scheduled meeting with patient and son tomorrow to discuss goal of treatment going forward.  Patient currently DNR  DVT prophylaxis; SCDs No heparin products due to GI bleed   CODE STATUS: DNR  TOTAL TIME TAKING CARE OF THIS PATIENT: 38 minutes.   More than 50% of the time was spent in counseling/coordination of care: YES  POSSIBLE D/C IN 2 DAYS, DEPENDING ON CLINICAL CONDITION.   Mahitha Hickling M.D on 05/15/2018 at 2:54 PM  Between 7am to 6pm - Pager - [phone]  After 6pm go to [email or web address] - Proofreader  [employer] [facility]  Office  [phone]  CC: Primary care physician; [name], MD  Note: This dictation was prepared with Dragon dictation along with smaller phrase technology. Any transcriptional errors that result from this process are unintentional.

## 2018-05-15 NOTE — Progress Notes (Signed)
*  PRELIMINARY RESULTS* Echocardiogram 2D Echocardiogram has been performed.  Gregory Tyler 05/15/2018, 9:01 AM

## 2018-05-15 NOTE — Progress Notes (Signed)
[facility]  ROUNDING NOTE   Subjective:   [name] admitted to [facility] on 05/14/2018 for Hypocalcemia [E83.51] Hyperkalemia [E87.5] Hypoxia [R09.02] Elevated troponin I level [R79.89] Acute renal failure, unspecified acute renal failure type [facility]) [N17.9] Gastrointestinal hemorrhage, unspecified gastrointestinal hemorrhage type [K92.2]  Patient was last seen by nephrology in December. Creatinine of 1.31. Patient has a history of acute renal failure requiring hemodialysis.   UOP 350  Objective:  Vital signs in last 24 hours:  Temp:  [97.3 F (36.3 C)-98.2 F (36.8 C)] 97.5 F (36.4 C) (01/23 0900) Pulse Rate:  [45-109] 101 (01/23 0900) Resp:  [11-20] 18 (01/23 0900) BP: (69-162)/(51-128) 133/115 (01/23 0900) SpO2:  [79 %-100 %] 100 % (01/23 0900) Weight:  [72 kg-72.6 kg] 72 kg (01/23 0500)  Weight change:  Filed Weights   05/14/18 1929 05/15/18 0500  Weight: 72.6 kg 72 kg    Intake/Output: I/O last 3 completed shifts: In: 2193.9 [I.V.:1100; Blood:360; IV Piggyback:733.9] Out: 350 [Urine:350]   Intake/Output this shift:  Total I/O In: 116 [IV Piggyback:116] Out: -   Physical Exam: General: NAD,   Head: Normocephalic, atraumatic. Moist oral mucosal membranes  Eyes: Anicteric, PERRL  Neck: Supple, trachea midline  Lungs:  Clear to auscultation  Heart: Regular rate and rhythm  Abdomen:  Soft, nontender,   Extremities: no peripheral edema.  Neurologic: Nonfocal, moving all four extremities  Skin: No lesions  Access: none    Basic Metabolic Panel: Recent Labs  Lab 05/14/18 1931 05/15/18 0304 05/15/18 0755  NA 132* 134* 134*  K 7.1* 6.0* 5.2*  CL 101 101 102  CO2 11* 14* 11*  GLUCOSE 82 83 127*  BUN 116* 116* 121*  CREATININE 5.90* 5.78* 5.65*  CALCIUM 6.2* 6.3* 6.1*  MG 2.1  --   --     Liver Function Tests: Recent Labs  Lab 05/14/18 1931 05/15/18 0304  AST 83* 79*  ALT 129* 125*  ALKPHOS 266* 255*  BILITOT 0.6 0.8   PROT 6.2* 6.6  ALBUMIN 2.5* 2.5*   Recent Labs  Lab 05/14/18 1931  LIPASE 57*   No results for input(s): AMMONIA in the last 168 hours.  CBC: Recent Labs  Lab 05/14/18 1931 05/15/18 0304  WBC 15.2* 14.9*  NEUTROABS 12.7*  --   HGB 10.8* 11.9*  HCT 34.9* 36.9*  MCV 90.6 88.3  PLT 338 288    Cardiac Enzymes: Recent Labs  Lab 05/14/18 1931 05/15/18 0304 05/15/18 0546  TROPONINI 0.19* 0.19* 0.18*    BNP: Invalid input(s): POCBNP  CBG: Recent Labs  Lab 05/15/18 0125 05/15/18 0157 05/15/18 0752  GLUCAP 66* 60 116*    Microbiology: Results for orders placed or performed during the hospital encounter of 05/14/18  MRSA PCR Screening     Status: None   Collection Time: 05/15/18  1:12 AM  Result Value Ref Range Status   MRSA by PCR NEGATIVE NEGATIVE Final    Comment:        The GeneXpert MRSA Assay (FDA approved for NASAL specimens only), is one component of a comprehensive MRSA colonization surveillance program. It is not intended to diagnose MRSA infection nor to guide or monitor treatment for MRSA infections. Performed at [facility], [address] [place], [place] [address]     Coagulation Studies: Recent Labs    05/14/18 1931  LABPROT 27.7*  INR 2.63    Urinalysis: Recent Labs    05/14/18 [place]  6.0  GLUCOSEU NEGATIVE  HGBUR SMALL*  BILIRUBINUR NEGATIVE  KETONESUR NEGATIVE  PROTEINUR 100*  NITRITE NEGATIVE  LEUKOCYTESUR MODERATE*      Imaging: Dg Chest Port 1 View  Result Date: 05/14/2018 CLINICAL DATA:  Rectal bleeding low oxygen saturation EXAM: PORTABLE CHEST 1 VIEW COMPARISON:  04/07/2018 FINDINGS: Retrocardiac opacity on the left. No pleural effusion. Stable cardiomediastinal silhouette. No pneumothorax. IMPRESSION: Left retrocardiac opacity may reflect atelectasis, pneumonia, or aspiration. Electronically Signed   By: Donavan Foil M.D.   On: 05/14/2018 22:36      Medications:   . sodium chloride    . ceFEPime (MAXIPIME) IV Stopped (05/15/18 0334)  .  sodium bicarbonate  infusion 1000 mL     . insulin aspart  0-5 Units Subcutaneous QHS  . insulin aspart  0-9 Units Subcutaneous TID WC  . pantoprazole (PROTONIX) IV  40 mg Intravenous Q12H  . patiromer  8.4 g Oral Daily  . rosuvastatin  20 mg Oral QHS  . tiotropium  18 mcg Inhalation Daily   acetaminophen **OR** acetaminophen, albuterol, ondansetron **OR** ondansetron (ZOFRAN) IV  Assessment/ Plan:  [name] is a 78 y.o. 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 Acute renal failure with hyperkalemia, metabolic acidosis and hyponatremia and chronic kidney disease stage III: baseline creatinine of 1.31, GFR of 53 on 11/19 Requiring hemodialysis on previous hospitalization.  Oliguric urine output.  Electrolytes have improved.  No acute indication for dialysis.  Ultrasound pending.  Discontinue veltassa  2. Anemia with chronic kidney disease and GI bleed: status post PRBC transfusion on admission hemoglobin stable at 11.9  3. Urinary tract infection: grossly pyuric urine.  - Empiric antibiotics: cefepime    LOS: 78 Gregory Tyler 1/23/202010:27 AM

## 2018-05-15 NOTE — Consult Note (Signed)
PULMONARY / CRITICAL CARE MEDICINE   Name: Gregory Tyler MRN: [id number] DOB: 1940/07/17    ADMISSION DATE:  05/14/2018 CONSULTATION DATE:  05/14/2018  REFERRING MD:  Dr. Jannifer Franklin  CHIEF COMPLAINT:  Rectal bleeding, UTI  BRIEF DISCUSSION: 78 y.o. Male, DNR/DNI, admitted with Rectal Bleeding, AKI, Hyperkalemia, Hypovolemic shock secondary to dehydration, and severe Sepsis secondary to UTI.  CXR also concerning for Atelectasis vs HCAP vs Aspiration PNA.  HISTORY OF PRESENT ILLNESS:   Gregory Tyler is a 78 y.o. Male, DNR/DNI, with a PMH as listed below who presents to [facility] ED on 05/14/18 with c/o Rectal bleeding and poor PO intake.  He states he hasn't seen any bleeding, but that he was told by staff at his nursing facility that he had rectal bleeding.  Upon presentation to the ED he was noted to be hypotensive and afebrile. Initial workup in the ED revealed Creatinine 5.9, Potassium 7.1, Sodium 132, Calcium 6.2, Anion gap 20, Troponin 0.19, Lactic Acid 4.6, WBC 12.29, Hemoglobin 10.8, Alk. Phosphatase 266, Lipase 57, AST 83, ALT 129.  Urinalysis is positive for UTI, and CXR is concerning for Atelectasis vs. Pneumonia. He is guaiac positive in the ED.  He is being admitted to [facility] for treatment of severe Sepsis secondary to UTI and questionable Pneumonia, Hypovolemic shock,  AKI, Hyperkalemia, Elevated troponin, and rectal bleeding.  PCCM is consulted for further management.  PAST MEDICAL HISTORY :  He  has a past medical history of Anemia, Anxiety, Arthritis, Cardiomyopathy ([place]), Chronic combined systolic (congestive) and diastolic (congestive) heart failure ([place]), Chronic Pelvic pain, Coronary artery disease, Depression, Diabetes mellitus without complication ([place]), Emphysema lung ([place]), Gastric ulcer, Gout, Hyperlipidemia, Hypertension, Nephrolithiasis, PAF (paroxysmal atrial fibrillation) ([place]), Prostate cancer ([place]), Stage III chronic kidney disease ([place]), and Weight loss.  PAST  SURGICAL HISTORY: He  has a past surgical history that includes Cholecystectomy; Appendectomy; Gastric bypass; Hemorrhoid surgery; Back surgery; pilonidal cyst; Cardiac catheterization (N/A, 06/17/2015); TEE without cardioversion (N/A, 06/17/2015); Cardiac catheterization (N/A, 03/30/2016); DIALYSIS/PERMA CATHETER INSERTION (N/A, 01/22/2018); Lower Extremity Angiography (Right, 01/23/2018); Lower Extremity Angiography (Left, 01/30/2018); DIALYSIS/PERMA CATHETER REMOVAL (N/A, 04/01/2018); PERIPHERAL VASCULAR BALLOON ANGIOPLASTY (Right, 04/09/2018); I&D extremity (Right, 04/22/2018); Application if wound vac (Right, 04/22/2018); and FLUOROSCOPY GUIDANCE (Right, 04/25/2018).  Allergies  Allergen Reactions  . Aspirin Other (See Comments)    GI Bleeding  . Tuberculin Tests Other (See Comments)    unknown  . Codeine Other (See Comments)    BLISTERS     No current facility-administered medications on file prior to encounter.    Current Outpatient Medications on File Prior to Encounter  Medication Sig  . acetaminophen (TYLENOL) 325 MG tablet Take 2 tablets (650 mg total) by mouth every 6 (six) hours as needed for mild pain (or Fever >/= 101).  Marland Kitchen albuterol (PROVENTIL HFA;VENTOLIN HFA) 108 (90 Base) MCG/ACT inhaler Inhale 1 puff into the lungs every 4 (four) hours as needed for wheezing or shortness of breath.  . allopurinol (ZYLOPRIM) 300 MG tablet Take 300 mg by mouth daily.   Marland Kitchen apixaban (ELIQUIS) 5 MG TABS tablet Take 1 tablet (5 mg total) by mouth 2 (two) times daily.  . digoxin 62.5 MCG TABS Take 0.0625 mg by mouth daily.  Marland Kitchen diltiazem (CARDIZEM) 30 MG tablet Take 1 tablet (30 mg total) by mouth every 8 (eight) hours.  . gabapentin (NEURONTIN) 100 MG capsule Take 1 capsule (100 mg total) by mouth at bedtime.  . Hypromellose (ARTIFICIAL TEARS) 0.4 % SOLN Place 1 drop into  both eyes 2 (two) times daily.  Marland Kitchen ipratropium-albuterol (DUONEB) 0.5-2.5 (3) MG/3ML SOLN Take 3 mLs by nebulization every 8 (eight)  hours as needed (COPD/SHOB).  . metFORMIN (GLUCOPHAGE) 500 MG tablet Take 1 tablet (500 mg total) by mouth daily with breakfast.  . metoprolol tartrate (LOPRESSOR) 25 MG tablet Take 0.5 tablets (12.5 mg total) by mouth 2 (two) times daily.  . rosuvastatin (CRESTOR) 20 MG tablet Take 20 mg by mouth at bedtime.   . torsemide (DEMADEX) 20 MG tablet Take 20 mg by mouth daily.  . vitamin A 8000 UNIT capsule Take 8,000 Units by mouth daily.  . vitamin C (ASCORBIC ACID) 250 MG tablet Take 250 mg by mouth 2 (two) times daily.  Marland Kitchen zinc gluconate 50 MG tablet Take 50 mg by mouth daily.  . collagenase (SANTYL) ointment Apply 1 application topically every evening. Apply to eschar on RLE wound  . HYDROcodone-acetaminophen (NORCO/VICODIN) 5-325 MG tablet Take 1 tablet by mouth every 6 (six) hours as needed for moderate pain or severe pain. (Patient not taking: Reported on 05/14/2018)  . tiotropium (SPIRIVA) 18 MCG inhalation capsule Place 18 mcg into inhaler and inhale daily.     FAMILY HISTORY:  His He indicated that his mother is deceased. He indicated that his father is deceased. He indicated that the status of his other is unknown.   SOCIAL HISTORY: He  reports that he quit smoking about 77 years ago. He quit smokeless tobacco use about 3 years ago.  His smokeless tobacco use included chew. He reports that he does not drink alcohol or use drugs.  REVIEW OF SYSTEMS:   Positives in BOLD: Gen: Denies fever, chills, weight change, fatigue, night sweats HEENT: Denies blurred vision, double vision, hearing loss, tinnitus, sinus congestion, rhinorrhea, sore throat, neck stiffness, dysphagia PULM: Denies shortness of breath, cough, sputum production, hemoptysis, wheezing CV: Denies chest pain, edema, orthopnea, paroxysmal nocturnal dyspnea, palpitations GI: Denies abdominal pain, nausea, vomiting, diarrhea, hematochezia, +melena, constipation, change in bowel habits GU: Denies dysuria, hematuria, polyuria,  oliguria, urethral discharge Endocrine: Denies hot or cold intolerance, polyuria, polyphagia or appetite change Derm: Denies rash, dry skin, scaling or peeling skin change Heme: Denies easy bruising, bleeding, bleeding gums Neuro: Denies headache, numbness, weakness, slurred speech, loss of memory or consciousness   SUBJECTIVE:  Pt reports pain in his back and sacral area Denies chest pain, shortness of breath, palpitations, fever/chills, cough or sputum production  VITAL SIGNS: BP (!) 93/57   Pulse 64   Temp 98.1 F (36.7 C) (Oral)   Resp 17   Ht 5' 8"  (1.727 m)   Wt 72.6 kg   SpO2 100%   BMI 24.33 kg/m   HEMODYNAMICS:    VENTILATOR SETTINGS:    INTAKE / OUTPUT: No intake/output data recorded.  PHYSICAL EXAMINATION: General:  Acute on chronically ill appearing male, laying in bed, on room air, in NAD Neuro:  Awake and alert, confused to time and place, follows commands, no focal deficits, speech is clear HEENT:  Atraumatic, normocephalic, neck supple, no JVD Cardiovascular: Irregularly irregular rhythm, rate controlled, no M/R/G Lungs:  Clear to auscultation bilaterally, even, nonlabored, no assessory muscle use, normal effort Abdomen:  Soft, nontender, nondistended, no guarding or rebound tenderness, BS hypoactive Musculoskeletal:  No deformities. Skin:  Warm/dry.  Pressure ulcer to sacrum (stage II).  Wounds to bilateral posterior calves  LABS:  BMET Recent Labs  Lab 05/14/18 1931  NA 132*  K 7.1*  CL 101  CO2 11*  BUN 116*  CREATININE 5.90*  GLUCOSE 82    Electrolytes Recent Labs  Lab 05/14/18 1931  CALCIUM 6.2*  MG 2.1    CBC Recent Labs  Lab 05/14/18 1931  WBC 15.2*  HGB 10.8*  HCT 34.9*  PLT 338    Coag's Recent Labs  Lab 05/14/18 1931  INR 2.63    Sepsis Markers Recent Labs  Lab 05/14/18 1946  LATICACIDVEN 4.6*    ABG No results for input(s): PHART, PCO2ART, PO2ART in the last 168 hours.  Liver Enzymes Recent Labs   Lab 05/14/18 1931  AST 83*  ALT 129*  ALKPHOS 266*  BILITOT 0.6  ALBUMIN 2.5*    Cardiac Enzymes Recent Labs  Lab 05/14/18 1931  TROPONINI 0.19*    Glucose No results for input(s): GLUCAP in the last 168 hours.  Imaging Dg Chest Port 1 View  Result Date: 05/14/2018 CLINICAL DATA:  Rectal bleeding low oxygen saturation EXAM: PORTABLE CHEST 1 VIEW COMPARISON:  04/07/2018 FINDINGS: Retrocardiac opacity on the left. No pleural effusion. Stable cardiomediastinal silhouette. No pneumothorax. IMPRESSION: Left retrocardiac opacity may reflect atelectasis, pneumonia, or aspiration. Electronically Signed   By: Donavan Foil M.D.   On: 05/14/2018 22:36     STUDIES:  Echocardiogram 05/15/18 Abdominal Ultrasound 05/15/18>>  CULTURES: Urine 1/22>> Blood 1/23>> Sputum 1/23>>  ANTIBIOTICS: Rocephin 1/22>>1/22 Vancomycin 1/23>> Cefepime 1/23>>  SIGNIFICANT EVENTS: 05/15/18>> Admission to [facility] Stepdown  LINES/TUBES:   ASSESSMENT / PLAN:  PULMONARY A: CXR w/ Left retrocardiac opacity (concerning for atelectasis vs HCAP vs Aspiration PNA) Hx: COPD P:   Supplemental O2 as needed to maintain O2 sats 88 to 94% Follow intermittent CXR and ABG as needed Obtain Sputum culture Place on Vancomycin and Cefepime Prn Bronchodilators  CARDIOVASCULAR A:  Severe sepsis + Hypovolemic shock in setting of Dehydration Chronic combined Systolic and Diastolic CHF, no acute exacerbation Elevated Troponin  Atrial Fibrillation, rate controlled Hx: Paroxsymal Atrial Fibrillation, HTN, HLD, CAD P:  Cardiac monitoring Maintain MAP >65 Received IVF bolus in ED with improvement noted in BP, continue NS @ 100 ml/hr Levophed if needed to maintain MAP goal Trend Troponin Consider Cardiology consult Obtain Echocardiogram Hold home Eliquis for now given rectal bleeding  RENAL A:   AKI on CKD III Hyperkalemia Hypocalcemia Anion gap metabolic acidosis in setting of Lactic acidosis and  AKI Hx: Nephrolithiasis, Prostrate cancer P:   Monitor I&O's / urinary output Follow BMP Ensure adequate renal perfusion Avoid nephrotoxic agents as able Replace electrolytes as indicated Consider Nephrology consult Received Ca Gluconate, insulin, D50, 1 amp Sodium Bicarb, Kayexalate, and Veltassa in ED, Follow up potassium is pending Trend Lactic acid   GASTROINTESTINAL A:   Rectal Bleed Guaiac positive in ED Elevated LFT's Hx: GERD, Gastric ulcer P:   NPO IV Protonix BID GI consulted, appreciate input Trend LFT's Obtain Abdominal Ultrasound  HEMATOLOGIC A:   Anemia in setting of ? GI Bleed Hx: Anemia P:  Monitor for S/Sx of bleeding Trend CBC SCD's for VTE Prophylaxis, will hold home Eliquis for now given Rectal bleeding  Transfuse for Hgb <7 Received 1 unit pRBC's in ED   INFECTIOUS A:   Sepsis secondary to UTI and ? HCAP vs Aspiration PNA Bilateral LE wounds and Sacral Pressure Ulcer P:   Monitor fever curve Trend WBC's and Procalcitonin Follow Pan cultures Place on Vancomycin & Cefepime; will D/c Rocephin Wound consult, appreciate input Consider Vascular Surgery consult for bilateral LE wounds  ENDOCRINE A:   Diabetes Mellitus   P:  CBG's SSI Follow ICU Hypo/hyperglycemia protocol  NEUROLOGIC A:   Acute Metabolic Encephalopathy in setting of Sepsis Hx: Anxiety P:   Provide supportive cre Avoid sedating meds as able Treat sepsis Lights on during the day   FAMILY  - Updates: Updated pt at bedside 1/23.  Pt is DNR/DNI.  - Inter-disciplinary family meet or Palliative Care meeting due by:  05/22/2018    Darel Hong, AGACNP-BC [place] Pager: [phone] Cell: [phone]  05/15/2018, 1:17 AM

## 2018-05-15 NOTE — Consult Note (Signed)
Vonda Antigua, MD [address], [place], [place], [place], [address] [address], [place], [place], [place], [address] Phone: [phone]  Fax: [phone]  Consultation  Referring Provider:    Dr. Lance Coon Primary Care Physician:  [name], MD Reason for Consultation:     Rectal bleed  Date of Admission:  05/14/2018 Date of Consultation:  05/15/2018         HPI:   Gregory Tyler is a 78 y.o. male admitted with sepsis, with GI consulted for rectal bleed.  During my evaluation patient is awake alert and oriented and denies any rectal bleeding.  Denies seeing any bright red blood or black stools.  No nausea vomiting, hematemesis or abdominal pain.  States only bleeding he has seen his from his leg.  Patient has a wound VAC in place and had an I&D within the last few weeks, and there is blood at the site of the wound and within the wound VAC itself.  H&P and ED note reported rectal bleeding at his nursing facility.  However, evaluation by ICU NP is consistent with patient's report to me that he did not see any rectal bleeding himself.  Patient's hemoglobin is 9-10 at baseline, and was noted to be 10.8 on admission.  Patient was also noted to be hypocalcemic, hyperkalemic, with very elevated creatinine from baseline.  He is hemodynamically stable, and also undergoing cardiac work-up for elevated troponin.  Past Medical History:  Diagnosis Date  . Anemia   . Anxiety   . Arthritis   . Cardiomyopathy ([place])    a. 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  Marland Kitchen Chronic combined systolic (congestive) and diastolic (congestive) heart failure ([place])    a. 05/2015 TEE: EF 35-40% (in setting of Afib); b. 08/2017 Echo: EF 55-60%, no rwma, Gr2DD; b. 01/2018 Echo: EF 55-60%, mild MR. Nl LA size. Nl RV fxn.  . Chronic Pelvic pain   . Coronary artery disease    a. 05/2015 CTA Chest: 3 vessel coronary Ca2+.  . Depression   . Diabetes mellitus without complication  ([place])   . Emphysema lung ([place])   . Gastric ulcer   . Gout   . Hyperlipidemia   . Hypertension   . Nephrolithiasis   . PAF (paroxysmal atrial fibrillation) ([place])    a. 05/2015 s/p DCCV; b. 03/2016 s/p DCCV; c. CHA2DS2VASc = 6 (Eliquis).  . Prostate cancer [facility])    Prostate  . Stage III chronic kidney disease ([place])   . Weight loss    50 lb since Apr 24, 2015    Past Surgical History:  Procedure Laterality Date  . APPENDECTOMY    . APPLICATION OF WOUND VAC Right 04/22/2018   Procedure: APPLICATION OF WOUND VAC;  Surgeon: Katha Cabal, MD;  Location: [facility] ORS;  Service: Vascular;  Laterality: Right;  . BACK SURGERY    . CHOLECYSTECTOMY    . DIALYSIS/PERMA CATHETER INSERTION N/A 01/22/2018   Procedure: DIALYSIS/PERMA CATHETER INSERTION;  Surgeon: Katha Cabal, MD;  Location: [place] [facility] LAB;  Service: Cardiovascular;  Laterality: N/A;  . DIALYSIS/PERMA CATHETER REMOVAL N/A 04/01/2018   Procedure: DIALYSIS/PERMA CATHETER REMOVAL;  Surgeon: Katha Cabal, MD;  Location: [place] [facility];  Service: Cardiovascular;  Laterality: N/A;  . ELECTROPHYSIOLOGIC STUDY N/A 06/17/2015   Procedure: CARDIOVERSION;  Surgeon: Wellington Hampshire, MD;  Location: [facility] ORS;  Service: Cardiovascular;  Laterality: N/A;  . ELECTROPHYSIOLOGIC STUDY N/A 03/30/2016  Procedure: CARDIOVERSION;  Surgeon: Minna Merritts, MD;  Location: [facility] ORS;  Service: Cardiovascular;  Laterality: N/A;  . FLUOROSCOPY GUIDANCE Right 04/25/2018   Procedure: FLUOROSCOPY GUIDANCE;  Surgeon: Katha Cabal, MD;  Location: [place] [facility];  Service: Cardiovascular;  Laterality: Right;  . GASTRIC BYPASS    . HEMORRHOID SURGERY    . I&D EXTREMITY Right 04/22/2018   Procedure: DEBRIDEMENT CALF;  Surgeon: Katha Cabal, MD;  Location: [facility] ORS;  Service: Vascular;  Laterality: Right;  . LOWER EXTREMITY ANGIOGRAPHY Right 01/23/2018   Procedure: LOWER EXTREMITY ANGIOGRAPHY;  Surgeon: Algernon Huxley, MD;   Location: [place] [facility];  Service: Cardiovascular;  Laterality: Right;  . LOWER EXTREMITY ANGIOGRAPHY Left 01/30/2018   Procedure: LOWER EXTREMITY ANGIOGRAPHY;  Surgeon: Algernon Huxley, MD;  Location: [place] [facility];  Service: Cardiovascular;  Laterality: Left;  . PERIPHERAL VASCULAR BALLOON ANGIOPLASTY Right 04/09/2018   Procedure: PERIPHERAL VASCULAR BALLOON ANGIOPLASTY;  Surgeon: Katha Cabal, MD;  Location: [place] [facility];  Service: Cardiovascular;  Laterality: Right;  . pilonidal cyst    . TEE WITHOUT CARDIOVERSION N/A 06/17/2015   Procedure: TRANSESOPHAGEAL ECHOCARDIOGRAM (TEE);  Surgeon: Wellington Hampshire, MD;  Location: [facility] ORS;  Service: Cardiovascular;  Laterality: N/A;    Prior to Admission medications   Medication Sig Start Date End Date Taking? Authorizing Provider  acetaminophen (TYLENOL) 325 MG tablet Take 2 tablets (650 mg total) by mouth every 6 (six) hours as needed for mild pain (or Fever >/= 101). 09/15/17  Yes Wieting, Richard, MD  albuterol (PROVENTIL HFA;VENTOLIN HFA) 108 (90 Base) MCG/ACT inhaler Inhale 1 puff into the lungs every 4 (four) hours as needed for wheezing or shortness of breath.   Yes [provider]  allopurinol (ZYLOPRIM) 300 MG tablet Take 300 mg by mouth daily.    Yes [provider]  apixaban (ELIQUIS) 5 MG TABS tablet Take 1 tablet (5 mg total) by mouth 2 (two) times daily. 04/10/18  Yes Bettey Costa, MD  digoxin 62.5 MCG TABS Take 0.0625 mg by mouth daily. 04/10/18  Yes Mody, Ulice Bold, MD  diltiazem (CARDIZEM) 30 MG tablet Take 1 tablet (30 mg total) by mouth every 8 (eight) hours. 04/10/18  Yes Mody, Ulice Bold, MD  gabapentin (NEURONTIN) 100 MG capsule Take 1 capsule (100 mg total) by mouth at bedtime. 09/15/17  Yes Wieting, Richard, MD  Hypromellose (ARTIFICIAL TEARS) 0.4 % SOLN Place 1 drop into both eyes 2 (two) times daily.   Yes [provider]  ipratropium-albuterol (DUONEB) 0.5-2.5 (3) MG/3ML SOLN Take 3  mLs by nebulization every 8 (eight) hours as needed (COPD/SHOB).   Yes [provider]  metFORMIN (GLUCOPHAGE) 500 MG tablet Take 1 tablet (500 mg total) by mouth daily with breakfast. 04/11/18  Yes Mody, Sital, MD  metoprolol tartrate (LOPRESSOR) 25 MG tablet Take 0.5 tablets (12.5 mg total) by mouth 2 (two) times daily. 04/28/18  Yes [name], MD  rosuvastatin (CRESTOR) 20 MG tablet Take 20 mg by mouth at bedtime.    Yes [provider]  torsemide (DEMADEX) 20 MG tablet Take 20 mg by mouth daily.   Yes [provider]  vitamin A 8000 UNIT capsule Take 8,000 Units by mouth daily.   Yes [provider]  vitamin C (ASCORBIC ACID) 250 MG tablet Take 250 mg by mouth 2 (two) times daily.   Yes [provider]  zinc gluconate 50 MG tablet Take 50 mg by mouth daily.   Yes  [provider]  collagenase (SANTYL) ointment Apply 1 application topically every evening. Apply to eschar on RLE wound    [provider]  HYDROcodone-acetaminophen (NORCO/VICODIN) 5-325 MG tablet Take 1 tablet by mouth every 6 (six) hours as needed for moderate pain or severe pain. Patient not taking: Reported on 05/14/2018 04/28/18   [name], MD  tiotropium (SPIRIVA) 18 MCG inhalation capsule Place 18 mcg into inhaler and inhale daily.     [provider]    Family History  Problem Relation Age of Onset  . CAD Mother   . Stroke Mother   . COPD Mother   . Heart failure Mother   . Diabetes Other      Social History   Tobacco Use  . Smoking status: Former Smoker    Last attempt to quit: 12/19/1940    Years since quitting: 77.4  . Smokeless tobacco: Former Systems developer    Types: Chew    Quit date: 11/19/2014  Substance Use Topics  . Alcohol use: No  . Drug use: No    Allergies as of 05/14/2018 - Review Complete 05/14/2018  Allergen Reaction Noted  . Aspirin Other (See Comments) 05/10/2015  . Tuberculin tests Other (See Comments)  04/07/2018  . Codeine Other (See Comments) 01/25/2014    Review of Systems:    All systems reviewed and negative except where noted in HPI.   Physical Exam:  Vital signs in last 24 hours: Vitals:   05/15/18 1000 05/15/18 1030 05/15/18 1100 05/15/18 1200  BP: (!) 217/199 (!) 159/147 127/60 97/84  Pulse: (!) 109 (!) 109 (!) 110 (!) 124  Resp: 11 17 (!) 24 20  Temp:      TempSrc:      SpO2: 100% 100% 94% 100%  Weight:      Height:       Last BM Date: 05/15/18 General:   Pleasant, cooperative in NAD Head:  Normocephalic and atraumatic. Eyes:   No icterus.   Conjunctiva pink. PERRLA. Ears:  Normal auditory acuity. Neck:  Supple; no masses or thyroidomegaly Lungs: Respirations even and unlabored. Lungs clear to auscultation bilaterally.   No wheezes, crackles, or rhonchi.  Abdomen:  Soft, nondistended, nontender. Normal bowel sounds. No appreciable masses or hepatomegaly.  No rebound or guarding.  Musculoskeletal: Wound VAC in place to right lower extremity with fresh red blood within the wound VAC itself. Neurologic:  Alert and oriented x3;  grossly normal neurologically. Skin:  Intact without significant lesions or rashes. Cervical Nodes:  No significant cervical adenopathy. Psych:  Alert and cooperative. Normal affect.  LAB RESULTS: Recent Labs    05/14/18 1931 05/15/18 0304  WBC 15.2* 14.9*  HGB 10.8* 11.9*  HCT 34.9* 36.9*  PLT 338 288   BMET Recent Labs    05/14/18 1931 05/15/18 0304 05/15/18 0755  NA 132* 134* 134*  K 7.1* 6.0* 5.2*  CL 101 101 102  CO2 11* 14* 11*  GLUCOSE 82 83 127*  BUN 116* 116* 121*  CREATININE 5.90* 5.78* 5.65*  CALCIUM 6.2* 6.3* 6.1*   LFT Recent Labs    05/15/18 0304  PROT 6.6  ALBUMIN 2.5*  AST 79*  ALT 125*  ALKPHOS 255*  BILITOT 0.8   PT/INR Recent Labs    05/14/18 1931  LABPROT 27.7*  INR 2.63    STUDIES: US Abdomen Complete  Result Date: 05/15/2018 CLINICAL DATA:  Elevated LFTs, history CHF, coronary  artery disease, cardiomyopathy, diabetes mellitus, hypertension, stage III chronic kidney disease, atrial  fibrillation, nephrolithiasis EXAM: ABDOMEN ULTRASOUND COMPLETE COMPARISON:  Renal and abdominal ultrasound exams of 01/14/2018 FINDINGS: Gallbladder: Surgically absent Common bile duct: Diameter: 4 mm diameter, normal Liver: Upper normal echogenicity. No mass or nodularity. Portal vein is patent on color Doppler imaging with normal direction of blood flow towards the liver. IVC: Normal appearance Pancreas: Inadequately visualized due to bowel gas Spleen: Normal appearance, 4.2 cm length Right Kidney: Length: 9.8 cm. Normal cortical thickness. Increased cortical echogenicity. No mass or hydronephrosis. Left Kidney: Length: 9.7 cm. Normal cortical thickness. Increased cortical echogenicity. No mass or hydronephrosis. 7 mm mildly shadowing echogenic focus at mid LEFT kidney likely small nonobstructing calculus. Abdominal aorta: Atherosclerotic changes of aorta without aneurysm. Other findings: Small BILATERAL pleural effusions.  No ascites. IMPRESSION: Post cholecystectomy. Probable nonobstructing 7 mm mid LEFT renal calculus. Medical renal disease changes of both kidneys. BILATERAL pleural effusions. Electronically Signed   By: Lavonia Dana M.D.   On: 05/15/2018 10:34   Dg Chest Port 1 View  Result Date: 05/14/2018 CLINICAL DATA:  Rectal bleeding low oxygen saturation EXAM: PORTABLE CHEST 1 VIEW COMPARISON:  04/07/2018 FINDINGS: Retrocardiac opacity on the left. No pleural effusion. Stable cardiomediastinal silhouette. No pneumothorax. IMPRESSION: Left retrocardiac opacity may reflect atelectasis, pneumonia, or aspiration. Electronically Signed   By: Donavan Foil M.D.   On: 05/14/2018 22:36      Impression / Plan:   Gregory Tyler is a 78 y.o. y/o male with admission for sepsis, with GI consulted for reported rectal bleeding, with history of Eliquis use  Patient denies any rectal bleeding himself,  but it is reported in his H&P and ED provider notes.  Patient reports history of a colonoscopy at an outlying facility.  States results were normal and was done "not too long ago".  I do not have the procedure report.  Patient has several acute medical issues at this time including sepsis, recent I&D with bleeding from the site at this time, renal failure, elevated troponin. Given the above, patient is at high risk for any endoscopic procedures at this time.  In addition, he is hemodynamically stable with no active GI bleeding since admission.  Low suspicion for upper GI bleed given clinical findings.  However, okay to continue Protonix 40 IV twice daily  Patient surgical history notes hemorrhoid surgery, and blood per rectum seen at his nursing facility may have been due to underlying hemorrhoids.  Would recommend conservative management from an endoscopic standpoint at this time.  If patient has active signs of GI bleeding that cannot be managed with medical intervention such as PPI, blood transfusions etc. GI procedures can be considered at that time if needed, after cardiology evaluation and clearance.  However, if active GI bleeding occurs, would recommend RBC scan depending on the rate of bleeding as that would put patient at less risk than sedation in the setting of acute issues.  PPI IV twice daily  Continue serial CBCs and transfuse PRN Avoid NSAIDs Maintain 2 large-bore IV lines Please page GI with any acute hemodynamic changes, or signs of active GI bleeding  That said, patient should have close follow-up as an outpatient with GI upon discharge as need for outpatient endoscopy after acute medical issues resolve needs to be considered and discussed.  Patient had elevated liver enzymes on admission.  However, looking back, he has had elevated liver enzymes back in September and October 2019 and since then they have been decreasing.  At that time she was diagnosed with shock liver.  Persistent elevation in liver enzymes likely due to hypotension and shock liver from sepsis on this admission in addition to renal failure.  Next Patient had complete liver work-up with serology and ultrasound in September 2019 due to elevated liver enzymes and this was unrevealing including no signs of viral hepatitis.  Repeat CMP daily Avoid hypotension If liver enzymes continue to worsen, can repeat liver serology and ultrasound.  If they continue to improve with medical resuscitation, then it is consistent with shock liver from sepsis. Normal bilirubin is reassuring for no bile duct obstruction.  No abdominal pain either.  Please follow-up with vascular surgery recommendations in regard to bleeding from his wound VAC site.  Thank you for involving me in the care of this patient.      LOS: 1 day   Virgel Manifold, MD  05/15/2018, 1:59 PM

## 2018-05-16 LAB — BASIC METABOLIC PANEL
Anion gap: 19 — ABNORMAL HIGH (ref 5–15)
BUN: 119 mg/dL — ABNORMAL HIGH (ref 8–23)
CO2: 17 mmol/L — ABNORMAL LOW (ref 22–32)
Calcium: 5.6 mg/dL — CL (ref 8.9–10.3)
Chloride: 99 mmol/L (ref 98–111)
Creatinine, Ser: 5.68 mg/dL — ABNORMAL HIGH (ref 0.61–1.24)
GFR calc Af Amer: 10 mL/min — ABNORMAL LOW (ref >60)
GFR, EST NON AFRICAN AMERICAN: 9 mL/min — AB (ref >60)
Glucose, Bld: 120 mg/dL — ABNORMAL HIGH (ref 70–99)
Potassium: 4.3 mmol/L (ref 3.5–5.1)
Sodium: 135 mmol/L (ref 135–145)

## 2018-05-16 LAB — CBC
HCT: 32.9 % — ABNORMAL LOW (ref 39.0–52.0)
Hemoglobin: 10.6 g/dL — ABNORMAL LOW (ref 13.0–17.0)
MCH: 28.2 pg (ref 26.0–34.0)
MCHC: 32.2 g/dL (ref 30.0–36.0)
MCV: 87.5 fL (ref 80.0–100.0)
Platelets: 233 10*3/uL (ref 150–400)
RBC: 3.76 MIL/uL — ABNORMAL LOW (ref 4.22–5.81)
RDW: 16.9 % — ABNORMAL HIGH (ref 11.5–15.5)
WBC: 15.4 10*3/uL — ABNORMAL HIGH (ref 4.0–10.5)
nRBC: 0 % (ref 0.0–0.2)

## 2018-05-16 LAB — PHOSPHORUS: Phosphorus: 9.9 mg/dL — ABNORMAL HIGH (ref 2.5–4.6)

## 2018-05-16 LAB — GLUCOSE, CAPILLARY
Glucose-Capillary: 105 mg/dL — ABNORMAL HIGH (ref 70–99)
Glucose-Capillary: 135 mg/dL — ABNORMAL HIGH (ref 70–99)
Glucose-Capillary: 140 mg/dL — ABNORMAL HIGH (ref 70–99)
Glucose-Capillary: 107 mg/dL — ABNORMAL HIGH (ref 70–99)

## 2018-05-16 LAB — MAGNESIUM: MAGNESIUM: 1.7 mg/dL (ref 1.7–2.4)

## 2018-05-16 LAB — PROCALCITONIN: Procalcitonin: 1.28 ng/mL

## 2018-05-16 MED ORDER — OXYCODONE-ACETAMINOPHEN 5-325 MG PO TABS
1.0000 | ORAL_TABLET | ORAL | Status: DC | PRN
  Administered 2018-05-16: 1 via ORAL
  Filled 2018-05-16: qty 1

## 2018-05-16 MED ORDER — HALOPERIDOL LACTATE 5 MG/ML IJ SOLN
2.0000 mg | Freq: Once | INTRAMUSCULAR | Status: AC
  Administered 2018-05-16: 2 mg via INTRAVENOUS
  Filled 2018-05-16: qty 1

## 2018-05-16 NOTE — Progress Notes (Signed)
Late entry ([id number]) talked to Dr. Stark Jock about patient's complaints of pain after dressing was change on his wound, patient had tylenol which we tried but did not help him. Order given for Percocet 5-325 PRN, patient is allergic to codein which MD is aware. RN will continue to monitor.

## 2018-05-16 NOTE — Clinical Social Work Note (Signed)
CSW was informed that patient is a long term care resident at [facility].  Palliative is recommending hospice follow patient at SNF, CSW to continue to follow patient's progress throughout discharge planning.  Jones Broom. Norval Morton, MSW, [place]  05/16/2018 12:44 PM

## 2018-05-16 NOTE — Progress Notes (Addendum)
Notify Dr. Jannifer [place] about patient's HR fluctuating between 100's-120's, highest at 127 non sustaining, BP 95/68 . Also patient is confused and is yelling out at times, asked if he can have sleeping aid, order for Haldol IV once. RN will continue to monitor.

## 2018-05-16 NOTE — Progress Notes (Signed)
[place] [facility] [place] NAME: Gregory Tyler    MR#:  [id number]  DATE OF BIRTH:  [birth year]  SUBJECTIVE:  CHIEF COMPLAINT:   Chief Complaint  Patient presents with  . Rectal Bleeding   No new complaint reported this morning.  No further bleeding reported. Patient more awake this morning.  REVIEW OF SYSTEMS:  Review of Systems  Constitutional: Negative for chills.  HENT: Negative for ear pain and hearing loss.   Eyes: Negative for blurred vision and double vision.  Respiratory: Negative for cough and hemoptysis.   Cardiovascular: Negative for chest pain and palpitations.  Gastrointestinal: Negative for heartburn and nausea.  Genitourinary: Negative for dysuria.  Musculoskeletal: Negative for myalgias and neck pain.  Skin: Negative for itching and rash.  Neurological: Negative for dizziness and headaches.  Psychiatric/Behavioral: Negative for depression and hallucinations.       DRUG ALLERGIES:   Allergies  Allergen Reactions  . Aspirin Other (See Comments)    GI Bleeding  . Tuberculin Tests Other (See Comments)    unknown  . Codeine Other (See Comments)    BLISTERS    VITALS:  Blood pressure 100/80, pulse (!) 105, temperature (!) 97.5 F (36.4 C), temperature source Oral, resp. rate 16, height 5' 8"  (1.727 m), weight 70.9 kg, SpO2 93 %. PHYSICAL EXAMINATION:   Physical Exam  Constitutional: He is well-developed, well-nourished, and in no distress.  HENT:  Head: Normocephalic and atraumatic.  Eyes: Pupils are equal, round, and reactive to light. Conjunctivae are normal. Right eye exhibits no discharge.  Neck: Normal range of motion. Neck supple. No tracheal deviation present.  Cardiovascular: Normal rate, regular rhythm and normal heart sounds.  Pulmonary/Chest: Effort normal. He has rales.  Abdominal: Soft. Bowel sounds are normal. There is no abdominal tenderness.  Musculoskeletal: Normal range of motion.      General: No edema.  Neurological:  Patient resting comfortably.  Full neuro exam not done  Skin: Skin is warm. He is not diaphoretic. No erythema.   LABORATORY PANEL:  Male CBC Recent Labs  Lab 05/16/18 0337  WBC 15.4*  HGB 10.6*  HCT 32.9*  PLT 233   ------------------------------------------------------------------------------------------------------------------ Chemistries  Recent Labs  Lab 05/15/18 0304  05/16/18 0337  NA 134*   < > 135  K 6.0*   < > 4.3  CL 101   < > 99  CO2 14*   < > 17*  GLUCOSE 83   < > 120*  BUN 116*   < > 119*  CREATININE 5.78*   < > 5.68*  CALCIUM 6.3*   < > 5.6*  MG  --   --  1.7  AST 79*  --   --   ALT 125*  --   --   ALKPHOS 255*  --   --   BILITOT 0.8  --   --    < > = values in this interval not displayed.   RADIOLOGY:  No results found. ASSESSMENT AND PLAN:    1.  UTI (urinary tract infection) - Patient already started on IV antibiotics.currently on cefepime pending results of urine culture and sensitivities.   IV fluid hydration   2.  Severe sepsis secondary to UTI and probable aspiration pneumonia Continue IV fluids and antibiotics.  Patient currently on IV cefepime.  Urine culture growing pseudomonas aeruginosa.  Final sensitivities still pending.   3.  Acute kidney injury and hyperkalemia and metabolic acidosis Hyperkalemia already improving with  potassium level down to 4.3 from 7.1. Nephrology service following and managing.  Very minimal improvement in renal function.  And son met with palliative care team today.  They do not want hemodialysis. Case manager to make arrangements to have patient return to nursing facility with hospice on discharge  4.  Lower GI bleed No further evidence of GI bleeding.  Hemoglobin remained stable.  Eliquis on hold Seen by gastroenterologist.  Faythe Ghee to continue with IV Protonix 40 mg twice daily recommendation was for conservative management from endoscopic standpoint at this time.  If  patient has active signs of GI bleed that Lysbeth Galas be managed medically with PPI and blood transfusion, GI procedure will be considered at that time.  5.   Elevated troponin of 0.19.   History of Coronary artery disease involving native coronary artery of native heart without angina pectoris continue home medications 2D echocardiogram done with ejection fraction of 50 to 55%.  Regional wall motion abnormalities cannot be excluded.  Patient already scheduled for meeting with palliative care team and his son tomorrow.  Follow-up on their decision regarding goals of care going forward  6.  Essential hypertension - Blood pressure medications were placed on hold due to low blood pressure on presentation  Blood pressure now trending up.  Monitor and resume home regimen if blood pressure continues to trend up  7.  Paroxysmal atrial fibrillation Rate controlled. Anticoagulation with Eliquis placed on hold due to presentation with lower GI bleed  8.  Diabetes mellitus type 2 Metformin on hold due to acute kidney injury Sliding scale insulin coverage.  Monitor blood sugars closely  9.  Multiple comorbidities mentioned above. Patient and son met with hospice today.  Plan is for return to nursing facility with hospice possibly tomorrow if urine culture and sensitivities available.  No further hospitalization.  No feeding tube.  No hemodialysis.   Patient currently DNR  DVT prophylaxis; SCDs No heparin products due to GI bleed   CODE STATUS: DNR  TOTAL TIME TAKING CARE OF THIS PATIENT: 36 minutes.   More than 50% of the time was spent in counseling/coordination of care: YES  POSSIBLE D/C IN 1 DAY, DEPENDING ON CLINICAL CONDITION.   Lajoy Vanamburg M.D on 05/16/2018 at 2:54 PM  Between 7am to 6pm - Pager - [phone]  After 6pm go to [email or web address] - Proofreader  [facility]  Office  [phone]  CC: Primary care physician; [name], MD  Note: This  dictation was prepared with Dragon dictation along with smaller phrase technology. Any transcriptional errors that result from this process are unintentional.

## 2018-05-16 NOTE — Care Management Important Message (Signed)
Patient unable to sign Medicare IM.  Left copy in room for reference.

## 2018-05-16 NOTE — Progress Notes (Signed)
Daily Progress Note   Patient Name: Gregory Tyler       Date: 05/16/2018 DOB: 1940-11-13  Age: 78 y.o. MRN#: [id number] Attending Physician: Otila Back, MD Primary Care Physician: [name], MD Admit Date: 05/14/2018  Reason for Consultation/Follow-up: Establishing goals of care, Hospice Evaluation, Non pain symptom management, Pain control and Psychosocial/spiritual support  Subjective: Patient slightly more confused today, does not want to talk, tells me to speak to his son about goals of care. He tells me he only wants to sleep.   Length of Stay: 2  Current Medications: Scheduled Meds:  . collagenase   Topical Daily  . insulin aspart  0-5 Units Subcutaneous QHS  . insulin aspart  0-9 Units Subcutaneous TID WC  . nutrition supplement (JUVEN)  1 packet Oral BID BM  . pantoprazole (PROTONIX) IV  40 mg Intravenous Q12H  . rosuvastatin  20 mg Oral QHS  . tiotropium  18 mcg Inhalation Daily  . vitamin C  250 mg Oral BID    Continuous Infusions: . sodium chloride    . cefTRIAXone (ROCEPHIN)  IV Stopped (05/15/18 1748)  .  sodium bicarbonate  infusion 1000 mL 50 mL/hr at 05/16/18 0705    PRN Meds: acetaminophen **OR** acetaminophen, albuterol, fentaNYL (SUBLIMAZE) injection, [DISCONTINUED] ondansetron **OR** ondansetron (ZOFRAN) IV  Physical Exam Constitutional:      General: He is not in acute distress.    Appearance: He is underweight. He is ill-appearing.  HENT:     Head: Normocephalic and atraumatic.  Cardiovascular:     Rate and Rhythm: Tachycardia present. Rhythm irregular.  Pulmonary:     Effort: Pulmonary effort is normal.  Musculoskeletal:     Comments: RLE pressure wounds dressed, one attached to wound vac with sanguinous drainage  Skin:    General: Skin is warm and  dry.  Neurological:     Mental Status: He is lethargic.     Comments: Oriented x3 with periods of confusion  Psychiatric:        Behavior: Behavior is withdrawn.             Vital Signs: BP 100/80 (BP Location: Left Arm)   Pulse (!) 105   Temp (!) 97.5 F (36.4 C) (Oral)   Resp 16   Ht 5' 8"  (1.727 m)   Wt 70.9 kg   SpO2 93%   BMI 23.78 kg/m  SpO2: SpO2: 93 % O2 Device: O2 Device: Nasal Cannula O2 Flow Rate: O2 Flow Rate (L/min): 2 L/min  Intake/output summary:   Intake/Output Summary (Last 24 hours) at 05/16/2018 1128 Last data filed at 05/16/2018 2376 Gross per 24 hour  Intake 1663.99 ml  Output 235 ml  Net 1428.99 ml   LBM: Last BM Date: 05/15/18 Baseline Weight: Weight: 72.6 kg Most recent weight: Weight: 70.9 kg       Palliative Assessment/Data: PPS 20%    Flowsheet Rows     Most Recent Value  Intake Tab  Referral Department  Critical care  Unit at Time of Referral  ICU  Palliative Care Primary Diagnosis  Nephrology  Date Notified  05/15/18  Palliative Care Type  New Palliative care  Reason for referral  Clarify Goals  of Care  Date of Admission  05/14/18  Date first seen by Palliative Care  05/15/18  # of days Palliative referral response time  0 Day(s)  # of days IP prior to Palliative referral  1  Clinical Assessment  Palliative Performance Scale Score  30%  Psychosocial & Spiritual Assessment  Palliative Care Outcomes  Patient/Family meeting held?  Yes  Who was at the meeting?  patient  Palliative Care Outcomes  Provided psychosocial or spiritual support, Provided advance care planning, Clarified goals of care      Patient Active Problem List   Diagnosis Date Noted  . Goals of care, counseling/discussion   . Rectal bleed 05/14/2018  . Elevated troponin 05/14/2018  . UTI (urinary tract infection) 05/14/2018  . Wound infection 04/21/2018  . Atrial fibrillation with RVR ([place]) 04/07/2018  . Protein-calorie malnutrition, severe 01/21/2018  .  Acute renal failure superimposed on chronic kidney disease ([place])   . Palliative care by specialist   . DNR (do not resuscitate)   . Sepsis ([place]) 01/13/2018  . Atherosclerotic PVD with ulceration ([place]) 01/09/2018  . Diarrhea 12/19/2017  . Pressure injury of skin 11/16/2017  . Enteritis 11/15/2017  . Malnutrition of moderate degree 09/13/2017  . Elevated INR 09/02/2017  . Paroxysmal atrial fibrillation ([place]) 10/25/2016  . Weakness generalized   . Hyperlipidemia 08/08/2015  . Diabetes ([place]) 05/19/2015  . Hyperkalemia   . Dehydration   . Orthostatic hypotension 05/09/2015  . Congestive dilated cardiomyopathy ([place]) 05/09/2015  . Polypharmacy 04/27/2015  . Chronic combined systolic and diastolic CHF (congestive heart failure) ([place]) 04/25/2015  . SOB (shortness of breath)   . Coronary artery disease involving native coronary artery of native heart without angina pectoris   . Essential hypertension     Palliative Care Assessment & Plan   HPI: 78 y.o. male  with past medical history of CHF, COPD, T2DM, a fib, CAD, gastric ulcer, HTN, CKD, HLD, anemia, nephrolithiasis, prostate cancer, anxiety, and depression admitted on 05/14/2018 with concern for rectal bleeding and low oxygen. He reports 7-8 days of poor PO intake. He was found to be hypotensive on admission. Further work up revealed creatinine of 5.9, potassium of 7.1, troponin 0.19, lactic acid 4.6, and hgb 10.8. UA positive for UTI and CXR reveals pna vs atelectasis. Treatment started for severe sepsis secondary to UTI/?pna. Also being treated for AKI and hyperkalemia. Patient given 1 unit PRBC in ED, no further blood given after hgb found to be higher than previous admission at 10.8. Echocardiogram obtained with EF 50-55%. Per nephrology, patient has a history of AKI requiring HD. He had an HD catheter that patient tells me was recently removed. No acute indication for HD. Patient also has BLE and sacral pressure ulcers. Wound on right LE  attached to wound vac. Patient has been living at [facility]. Patient has frequent hospitalizations. 6 in the past 6 months - PMT consulted for [place].   Assessment: Today I had follow-up with patient and met patient's son [name] to have a scheduled [place] discussion.   I have reviewed medical records including EPIC notes, labs and imaging, and assessed the patient.  I introduced Palliative Medicine as specialized medical care for people living with serious illness. It focuses on providing relief from the symptoms and stress of a serious illness. The goal is to improve quality of life for both the patient and the family.  We discussed a brief life review of the patient. [name] tells me about how the patient has  not been doing well for a while. He has lived at [facility] since September. He also shares how difficult it has been for the patient to see his wife decline - she lives in memory care.   As far as functional and nutritional status, [name] shares that patient has been non-ambulatory since July. [name] tells me the patient became weaker and weaker until he was no longer to stand or support himself. He tells me patient has been essentially bed bound and wounds have continued to worsen. [name] shares that the patient usually has a good appetite; but has not wanted to eat anything for the past week. As far as the patient's cognition, [name] shares that the confusion we are seeing now is not typical - usually the patient's mind is very sharp. We discussed his renal failure and infection are contributing to his mental status.    We discussed his current illness and what it means in the larger context of his on-going co-morbidities.  Natural disease trajectory and expectations at EOL were discussed. Specifically discussed patient's renal failure, repeated hospitalization, and severe debility.   I attempted to elicit values and goals of care important to the patient.  [name] shares that the patient has been  expressing his desire to not continue living this way for about the past month. [name] understands the patient believes he has poor quality of life and does not want life prolonged.  The difference between aggressive medical intervention and comfort care was considered in light of the patient's goals of care. We discussed that now the patient is on an aggressive medical path and detailed what a continued aggressive path would include: likely long-term dialysis, likely frequent hospitalizations. [name] shares this is not what his father wants. We discussed what a comfort path would look like utilizing the support of hospice at the patient's facility. [name] confirms that he wants to pursue comfort path and get his father the support of hospice. [name] also confirms they do not want to pursue dialysis.  [name] asks about the patient going to a hospice facility - we discussed that at this time it is unclear if the patient's prognosis qualifies him for hospice facility. Dr. Juleen China was present for meeting and also shares that he could have a <2 week prognosis but could also live longer than that depending on his response to antibiotics/hydration. We discussed that if patient significantly worsens in the coming days we could see if he is eligible for hospice facility; but for now will plan on transitioning back to [facility] with Hospice support there.   Advance directives, concepts specific to code status, artifical feeding and hydration, and rehospitalization were considered and discussed. We completed a MOST form to reflect the following - DNR, Comfort measures, do not hospitalize, determine use of antibiotics when infection occurs, No IV fluids, No feeding tube. MOST signed and placed on chart.   Questions and concerns were addressed. The family was encouraged to call with questions or concerns.   Recommendations/Plan:  Patient to return to LTC with hospice support once stabilized - discussed with CSW  If  acutely worsens and prognosis appears to be <2 weeks family and patient would prefer hospice home  MOST completed and placed on chart - DNR, comfort measures, no further hospitalizations, no IV fluids, no feeding tube  No dialysis  Goals of Care and Additional Recommendations:  Limitations on Scope of Treatment: Avoid Hospitalization, No Artificial Feeding and No Hemodialysis  Code Status:  DNR  Prognosis:   <  6 months - prognosis likely much less - potentially <2weeks depending on kidney function  Discharge Planning:  [place] with Hospice  Care plan was discussed with patient, son/HCPOA, Dr. Juleen China, Dr. Stark Jock, [place]  Thank you for allowing the Palliative Medicine Team to assist in the care of this patient.   Time In: 1020 Time Out: 1125 Total Time 65 minutes Prolonged Time Billed  yes       Greater than 50%  of this time was spent counseling and coordinating care related to the above assessment and plan.  Juel Burrow, DNP, [facility] Palliative Medicine Team Team Phone # [phone]  Pager [phone]

## 2018-05-16 NOTE — Progress Notes (Signed)
[facility]  ROUNDING NOTE   Subjective:   Patient states that he is tired and does not want to pursue dialysis. Patient's son and palliative care met with patient who agree with no more aggressive measures.   Objective:  Vital signs in last 24 hours:  Temp:  [97.5 F (36.4 C)-97.9 F (36.6 C)] 97.5 F (36.4 C) (01/24 0819) Pulse Rate:  [87-124] 105 (01/24 0819) Resp:  [9-20] 16 (01/24 0329) BP: (75-127)/(56-94) 100/80 (01/24 0819) SpO2:  [91 %-100 %] 93 % (01/24 0819) FiO2 (%):  [28 %] 28 % (01/23 1920) Weight:  [70.9 kg-72 kg] 70.9 kg (01/24 0329)  Weight change: -0.576 kg Filed Weights   05/15/18 0500 05/16/18 0238 05/16/18 0329  Weight: 72 kg 72 kg 70.9 kg    Intake/Output: I/O last 3 completed shifts: In: 4114 [P.O.:60; I.V.:2245.7; Blood:360; IV Piggyback:1448.3] Out: 585 [Urine:585]   Intake/Output this shift:  Total I/O In: 360 [P.O.:360] Out: -   Physical Exam: General: NAD,   Head: Normocephalic, atraumatic. Moist oral mucosal membranes  Eyes: Anicteric, PERRL  Neck: Supple, trachea midline  Lungs:  Clear to auscultation  Heart: Regular rate and rhythm  Abdomen:  Soft, nontender,   Extremities: no peripheral edema.  Neurologic: Nonfocal, moving all four extremities  Skin: No lesions  Access: none    Basic Metabolic Panel: Recent Labs  Lab 05/14/18 1931 05/15/18 0304 05/15/18 0755 05/15/18 1425 05/16/18 0337  NA 132* 134* 134* 132* 135  K 7.1* 6.0* 5.2* 4.8 4.3  CL 101 101 102 101 99  CO2 11* 14* 11* 12* 17*  GLUCOSE 82 83 127* 177* 120*  BUN 116* 116* 121* 112* 119*  CREATININE 5.90* 5.78* 5.65* 5.84* 5.68*  CALCIUM 6.2* 6.3* 6.1* 5.9* 5.6*  MG 2.1  --   --   --  1.7  PHOS  --   --   --   --  9.9*    Liver Function Tests: Recent Labs  Lab 05/14/18 1931 05/15/18 0304  AST 83* 79*  ALT 129* 125*  ALKPHOS 266* 255*  BILITOT 0.6 0.8  PROT 6.2* 6.6  ALBUMIN 2.5* 2.5*   Recent Labs  Lab 05/14/18 1931  LIPASE 57*    No results for input(s): AMMONIA in the last 168 hours.  CBC: Recent Labs  Lab 05/14/18 1931 05/15/18 0304 05/16/18 0337  WBC 15.2* 14.9* 15.4*  NEUTROABS 12.7*  --   --   HGB 10.8* 11.9* 10.6*  HCT 34.9* 36.9* 32.9*  MCV 90.6 88.3 87.5  PLT 338 288 233    Cardiac Enzymes: Recent Labs  Lab 05/14/18 1931 05/15/18 0304 05/15/18 0546  TROPONINI 0.19* 0.19* 0.18*    BNP: Invalid input(s): POCBNP  CBG: Recent Labs  Lab 05/15/18 0752 05/15/18 1122 05/15/18 1643 05/15/18 2144 05/16/18 0818  GLUCAP 116* 89 150* 117* 105*    Microbiology: Results for orders placed or performed during the hospital encounter of 05/14/18  Urine Culture     Status: Abnormal (Preliminary result)   Collection Time: 05/14/18  7:46 PM  Result Value Ref Range Status   Specimen Description   Final    URINE, RANDOM Performed at [facility], [address] [place], [place] [address]    Special Requests   Final    NONE Performed at [facility], [address] [place], [place] [address]    Culture >=100,000 COLONIES/mL PSEUDOMONAS AERUGINOSA (A)  Final   Report Status PENDING  Incomplete  MRSA PCR Screening  Status: None   Collection Time: 05/15/18  1:12 AM  Result Value Ref Range Status   MRSA by PCR NEGATIVE NEGATIVE Final    Comment:        The GeneXpert MRSA Assay (FDA approved for NASAL specimens only), is one component of a comprehensive MRSA colonization surveillance program. It is not intended to diagnose MRSA infection nor to guide or monitor treatment for MRSA infections. Performed at [facility], [address], [place], [place] [address]   CULTURE, BLOOD (ROUTINE X 2) w Reflex to ID Panel     Status: None (Preliminary result)   Collection Time: 05/15/18  3:04 AM  Result Value Ref Range Status   Specimen Description BLOOD LEFT AC  Final   Special Requests   Final    BOTTLES DRAWN AEROBIC AND ANAEROBIC Blood Culture results may  not be optimal due to an excessive volume of blood received in culture bottles   Culture   Final    NO GROWTH 1 DAY Performed at [facility], [address], [place], [place] [address]    Report Status PENDING  Incomplete  CULTURE, BLOOD (ROUTINE X 2) w Reflex to ID Panel     Status: None (Preliminary result)   Collection Time: 05/15/18  3:07 AM  Result Value Ref Range Status   Specimen Description BLOOD LEFT WRIST  Final   Special Requests   Final    BOTTLES DRAWN AEROBIC AND ANAEROBIC Blood Culture adequate volume   Culture   Final    NO GROWTH 1 DAY Performed at [facility], [address] [place],  [address]    Report Status PENDING  Incomplete    Coagulation Studies: Recent Labs    05/14/18 1931  LABPROT 27.7*  INR 2.63    Urinalysis: Recent Labs    05/14/18 Gregory Tyler 1.014  PHURINE 6.0  GLUCOSEU NEGATIVE  HGBUR SMALL*  [place] 100*  NITRITE NEGATIVE  LEUKOCYTESUR MODERATE*      Imaging: US Abdomen Complete  Result Date: 05/15/2018 CLINICAL DATA:  Elevated LFTs, history CHF, coronary artery disease, cardiomyopathy, diabetes mellitus, hypertension, stage III chronic kidney disease, atrial fibrillation, nephrolithiasis EXAM: ABDOMEN ULTRASOUND COMPLETE COMPARISON:  Renal and abdominal ultrasound exams of 01/14/2018 FINDINGS: Gallbladder: Surgically absent Common bile duct: Diameter: 4 mm diameter, normal Liver: Upper normal echogenicity. No mass or nodularity. Portal vein is patent on color Doppler imaging with normal direction of blood flow towards the liver. IVC: Normal appearance Pancreas: Inadequately visualized due to bowel gas Spleen: Normal appearance, 4.2 cm length Right Kidney: Length: 9.8 cm. Normal cortical thickness. Increased cortical echogenicity. No mass or hydronephrosis. Left Kidney: Length: 9.7 cm. Normal cortical thickness. Increased cortical  echogenicity. No mass or hydronephrosis. 7 mm mildly shadowing echogenic focus at mid LEFT kidney likely small nonobstructing calculus. Abdominal aorta: Atherosclerotic changes of aorta without aneurysm. Other findings: Small BILATERAL pleural effusions.  No ascites. IMPRESSION: Post cholecystectomy. Probable nonobstructing 7 mm mid LEFT renal calculus. Medical renal disease changes of both kidneys. BILATERAL pleural effusions. Electronically Signed   By: Lavonia Dana M.D.   On: 05/15/2018 10:34   Dg Chest Port 1 View  Result Date: 05/14/2018 CLINICAL DATA:  Rectal bleeding low oxygen saturation EXAM: PORTABLE CHEST 1 VIEW COMPARISON:  04/07/2018 FINDINGS: Retrocardiac opacity on the left. No pleural effusion. Stable cardiomediastinal silhouette. No pneumothorax. IMPRESSION: Left retrocardiac opacity may reflect atelectasis, pneumonia, or aspiration. Electronically Signed  By: Donavan Foil M.D.   On: 05/14/2018 22:36     Medications:   . sodium chloride    . cefTRIAXone (ROCEPHIN)  IV Stopped (05/15/18 1748)  .  sodium bicarbonate  infusion 1000 mL 50 mL/hr at 05/16/18 0705   . collagenase   Topical Daily  . insulin aspart  0-5 Units Subcutaneous QHS  . insulin aspart  0-9 Units Subcutaneous TID WC  . nutrition supplement (JUVEN)  1 packet Oral BID BM  . pantoprazole (PROTONIX) IV  40 mg Intravenous Q12H  . rosuvastatin  20 mg Oral QHS  . tiotropium  18 mcg Inhalation Daily  . vitamin C  250 mg Oral BID   acetaminophen **OR** acetaminophen, albuterol, fentaNYL (SUBLIMAZE) injection, [DISCONTINUED] ondansetron **OR** ondansetron (ZOFRAN) IV  Assessment/ Plan:  Gregory Tyler is a 78 y.o. 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 Acute renal  failure with hyperkalemia, metabolic acidosis and hyponatremia and chronic kidney disease stage III: baseline creatinine of 1.31, GFR of 53 on 11/19 Requiring hemodialysis on previous hospitalization.  Patient is not wanting renal replacement therapy at this time.   2. Anemia with chronic kidney disease and GI bleed: status post PRBC transfusion on admission hemoglobin stable at 10.6  3. Urinary tract infection: grossly pyuric urine. Pseudomonas species.  - Empiric antibiotics: ceftriaxone   LOS: 2 Crist Kruszka 1/24/202011:12 AM

## 2018-05-16 NOTE — NC FL2 (Deleted)
[place] LEVEL OF CARE SCREENING TOOL     IDENTIFICATION  Patient Name: Gregory Tyler Birthdate: [birth year] Sex: male Admission Date (Current Location): 05/14/2018  [place] and [place] Number:  Engineering geologist and Address:  [facility], [address] [place], [place] [address]      Provider Number: [id number]  Attending Physician Name and Address:  Otila Back, MD  Relative Name and Phone Number:       Current Level of Care: Hospital Recommended Level of Care: [place] Prior Approval Number:    Date Approved/Denied:   PASRR Number: [id number]  Discharge Plan: SNF    Current Diagnoses: Patient Active Problem List   Diagnosis Date Noted  . Goals of care, counseling/discussion   . Rectal bleed 05/14/2018  . Elevated troponin 05/14/2018  . UTI (urinary tract infection) 05/14/2018  . Wound infection 04/21/2018  . Atrial fibrillation with RVR ([place]) 04/07/2018  . Protein-calorie malnutrition, severe 01/21/2018  . Acute renal failure superimposed on chronic kidney disease ([place])   . Palliative care by specialist   . DNR (do not resuscitate)   . Sepsis ([place]) 01/13/2018  . Atherosclerotic PVD with ulceration ([place]) 01/09/2018  . Diarrhea 12/19/2017  . Pressure injury of skin 11/16/2017  . Enteritis 11/15/2017  . Malnutrition of moderate degree 09/13/2017  . Elevated INR 09/02/2017  . Paroxysmal atrial fibrillation ([place]) 10/25/2016  . Weakness generalized   . Hyperlipidemia 08/08/2015  . Diabetes ([place]) 05/19/2015  . Hyperkalemia   . Dehydration   . Orthostatic hypotension 05/09/2015  . Congestive dilated cardiomyopathy ([place]) 05/09/2015  . Polypharmacy 04/27/2015  . Chronic combined systolic and diastolic CHF (congestive heart failure) ([place]) 04/25/2015  . SOB (shortness of breath)   . Coronary artery disease involving native coronary artery of native heart without angina pectoris   . Essential  hypertension     Orientation RESPIRATION BLADDER Height & Weight     Self, Place  O2(2L) Incontinent Weight: 156 lb 6.4 oz (70.9 kg) Height:  5\' 8"  (172.7 cm)  BEHAVIORAL SYMPTOMS/MOOD NEUROLOGICAL BOWEL NUTRITION STATUS           AMBULATORY STATUS COMMUNICATION OF NEEDS Skin   Extensive Assist                           Personal Care Assistance Level of Assistance  Bathing, Feeding, Dressing Bathing Assistance: Limited assistance Feeding assistance: Independent Dressing Assistance: Limited assistance Total Care Assistance: Limited assistance   Functional Limitations Info  Sight, Hearing, Speech Sight Info: Adequate Hearing Info: Adequate Speech Info: Adequate    SPECIAL CARE FACTORS FREQUENCY  PT (By licensed PT), OT (By licensed OT)     PT Frequency: min 2x a week OT Frequency: min 2x a week            Contractures Contractures Info: Not present    Additional Factors Info  Code Status, Allergies, Insulin Sliding Scale Code Status Info: DNR Allergies Info: ASPIRIN, TUBERCULIN TESTS, CODEINE    Insulin Sliding Scale Info: insulin aspart (novoLOG) injection 0-5 Units 3x a day with meals       Current Medications (05/16/2018):  This is the current hospital active medication list Current Facility-Administered Medications  Medication Dose Route Frequency Provider Last Rate Last Dose  . 0.9 %  sodium chloride infusion  10 mL/hr Intravenous Once Lance Coon, MD      . acetaminophen (TYLENOL) tablet 650 mg  650 mg  Oral Q6H PRN Lance Coon, MD   650 mg at 05/16/18 1551   Or  . acetaminophen (TYLENOL) suppository 650 mg  650 mg Rectal Q6H PRN Lance Coon, MD      . albuterol (PROVENTIL) (2.5 MG/3ML) 0.083% nebulizer solution 3 mL  3 mL Inhalation Q4H PRN Lance Coon, MD      . cefTRIAXone (ROCEPHIN) 1 g in sodium chloride 0.9 % 100 mL IVPB  1 g Intravenous Q24H Awilda Bill, NP   Stopped at 05/15/18 1748  . collagenase (SANTYL) ointment   Topical Daily  Ojie, Jude, MD      . fentaNYL (SUBLIMAZE) injection 25-50 mcg  25-50 mcg Intravenous Q2H PRN Wilhelmina Mcardle, MD   25 mcg at 05/15/18 1115  . insulin aspart (novoLOG) injection 0-5 Units  0-5 Units Subcutaneous QHS Lance Coon, MD      . insulin aspart (novoLOG) injection 0-9 Units  0-9 Units Subcutaneous TID WC Lance Coon, MD   1 Units at 05/16/18 1225  . nutrition supplement (JUVEN) (JUVEN) powder packet 1 packet  1 packet Oral BID BM Awilda Bill, NP      . ondansetron (ZOFRAN) injection 4 mg  4 mg Intravenous Q6H PRN Lance Coon, MD      . pantoprazole (PROTONIX) injection 40 mg  40 mg Intravenous Q12H Darel Hong D, NP   40 mg at 05/16/18 0959  . rosuvastatin (CRESTOR) tablet 20 mg  20 mg Oral Corwin Levins, MD   20 mg at 05/15/18 2110  . sodium bicarbonate 150 mEq in dextrose 5 % 1,000 mL infusion   Intravenous Continuous Wilhelmina Mcardle, MD 50 mL/hr at 05/16/18 1400    . tiotropium (SPIRIVA) inhalation capsule ([facility] use ONLY) 18 mcg  18 mcg Inhalation Daily Lance Coon, MD   18 mcg at 05/15/18 0902  . vitamin C (ASCORBIC ACID) tablet 250 mg  250 mg Oral BID Awilda Bill, NP   250 mg at 05/16/18 5790     Discharge Medications: Please see discharge summary for a list of discharge medications.  Relevant Imaging Results:  Relevant Lab Results:   Additional Information SS# [id number]  Anell Barr

## 2018-05-16 NOTE — Progress Notes (Addendum)
Gregory Antigua, MD [address], [place], [place], [place], [address] [address], [place], [place], [place], [address] Phone: [phone]  Fax: [phone]   Subjective: No episodes of bleeding since presentation to hospital.  Patient denies any abdominal pain, nausea or vomiting.  Transferred to the floor and tolerating oral diet.   Objective: Exam: Vital signs in last 24 hours: Vitals:   05/16/18 0238 05/16/18 0329 05/16/18 0819 05/16/18 1531  BP:  107/63 100/80 108/78  Pulse:  87 (!) 105 100  Resp:  16    Temp:  97.7 F (36.5 C) (!) 97.5 F (36.4 C)   TempSrc:  Oral Oral   SpO2:  91% 93% 92%  Weight: 72 kg 70.9 kg    Height:  5\' 8"  (1.727 m)     Weight change: -0.576 kg  Intake/Output Summary (Last 24 hours) at 05/16/2018 1613 Last data filed at 05/16/2018 1545 Gross per 24 hour  Intake 2104.45 ml  Output 338 ml  Net 1766.45 ml    General: No acute distress, AAO x3 Abd: Soft, NT/ND, No HSM Skin: Warm, no rashes Neck: Supple, Trachea midline   Lab Results: Lab Results  Component Value Date   WBC 15.4 (H) 05/16/2018   HGB 10.6 (L) 05/16/2018   HCT 32.9 (L) 05/16/2018   MCV 87.5 05/16/2018   PLT 233 05/16/2018   Micro Results: Recent Results (from the past 240 hour(s))  Urine Culture     Status: Abnormal (Preliminary result)   Collection Time: 05/14/18  7:46 PM  Result Value Ref Range Status   Specimen Description   Final    URINE, RANDOM Performed at [facility], [address], [place], [place] [address]    Special Requests   Final    NONE Performed at [facility], [address], [place], [place] [address]    Culture >=100,000 COLONIES/mL PSEUDOMONAS AERUGINOSA (A)  Final   Report Status PENDING  Incomplete  MRSA PCR Screening     Status: None   Collection Time: 05/15/18  1:12 AM  Result Value Ref Range Status   MRSA by PCR NEGATIVE NEGATIVE Final    Comment:        The GeneXpert MRSA Assay (FDA approved for  NASAL specimens only), is one component of a comprehensive MRSA colonization surveillance program. It is not intended to diagnose MRSA infection nor to guide or monitor treatment for MRSA infections. Performed at [facility], [address] [place], [place] [address]   CULTURE, BLOOD (ROUTINE X 2) w Reflex to ID Panel     Status: None (Preliminary result)   Collection Time: 05/15/18  3:04 AM  Result Value Ref Range Status   Specimen Description BLOOD LEFT AC  Final   Special Requests   Final    BOTTLES DRAWN AEROBIC AND ANAEROBIC Blood Culture results may not be optimal due to an excessive volume of blood received in culture bottles   Culture   Final    NO GROWTH 1 DAY Performed at [facility], [address], [place], [place] [address]    Report Status PENDING  Incomplete  CULTURE, BLOOD (ROUTINE X 2) w Reflex to ID Panel     Status: None (Preliminary result)   Collection Time: 05/15/18  3:07 AM  Result Value Ref Range Status   Specimen Description BLOOD LEFT WRIST  Final   Special Requests   Final    BOTTLES DRAWN AEROBIC AND ANAEROBIC Blood Culture adequate volume  Culture   Final    NO GROWTH 1 DAY Performed at [facility], [address] [place], [place] [address]    Report Status PENDING  Incomplete   Studies/Results: US Abdomen Complete  Result Date: 05/15/2018 CLINICAL DATA:  Elevated LFTs, history CHF, coronary artery disease, cardiomyopathy, diabetes mellitus, hypertension, stage III chronic kidney disease, atrial fibrillation, nephrolithiasis EXAM: ABDOMEN ULTRASOUND COMPLETE COMPARISON:  Renal and abdominal ultrasound exams of 01/14/2018 FINDINGS: Gallbladder: Surgically absent Common bile duct: Diameter: 4 mm diameter, normal Liver: Upper normal echogenicity. No mass or nodularity. Portal vein is patent on color Doppler imaging with normal direction of blood flow towards the liver. IVC: Normal appearance Pancreas: Inadequately  visualized due to bowel gas Spleen: Normal appearance, 4.2 cm length Right Kidney: Length: 9.8 cm. Normal cortical thickness. Increased cortical echogenicity. No mass or hydronephrosis. Left Kidney: Length: 9.7 cm. Normal cortical thickness. Increased cortical echogenicity. No mass or hydronephrosis. 7 mm mildly shadowing echogenic focus at mid LEFT kidney likely small nonobstructing calculus. Abdominal aorta: Atherosclerotic changes of aorta without aneurysm. Other findings: Small BILATERAL pleural effusions.  No ascites. IMPRESSION: Post cholecystectomy. Probable nonobstructing 7 mm mid LEFT renal calculus. Medical renal disease changes of both kidneys. BILATERAL pleural effusions. Electronically Signed   By: Lavonia Dana M.D.   On: 05/15/2018 10:34   Dg Chest Port 1 View  Result Date: 05/14/2018 CLINICAL DATA:  Rectal bleeding low oxygen saturation EXAM: PORTABLE CHEST 1 VIEW COMPARISON:  04/07/2018 FINDINGS: Retrocardiac opacity on the left. No pleural effusion. Stable cardiomediastinal silhouette. No pneumothorax. IMPRESSION: Left retrocardiac opacity may reflect atelectasis, pneumonia, or aspiration. Electronically Signed   By: Donavan Foil M.D.   On: 05/14/2018 22:36   Medications:  Scheduled Meds: . collagenase   Topical Daily  . insulin aspart  0-5 Units Subcutaneous QHS  . insulin aspart  0-9 Units Subcutaneous TID WC  . nutrition supplement (JUVEN)  1 packet Oral BID BM  . pantoprazole (PROTONIX) IV  40 mg Intravenous Q12H  . rosuvastatin  20 mg Oral QHS  . tiotropium  18 mcg Inhalation Daily  . vitamin C  250 mg Oral BID   Continuous Infusions: . sodium chloride    . cefTRIAXone (ROCEPHIN)  IV Stopped (05/15/18 1748)  .  sodium bicarbonate  infusion 1000 mL 50 mL/hr at 05/16/18 1400   PRN Meds:.acetaminophen **OR** acetaminophen, albuterol, fentaNYL (SUBLIMAZE) injection, [DISCONTINUED] ondansetron **OR** ondansetron (ZOFRAN) IV   Assessment: 78 year old male admitted with  sepsis with GI consulted for rectal bleeding   Plan: Please repeat his liver enzymes on a daily basis to ensure they are improving from possible shock liver.   Patient has denied seeing any blood in stool since his admission. The only source of bleeding that he saw was his right lower extremity for which a wound VAC is in place.  Hemoglobin remained stable and patient has not shown any signs of active GI bleeding since presentation to the hospital and denies any such episodes prior to presentation as well.  He is alert and oriented and gives good history.  If patient has any signs of active GI bleeding please page GI for further evaluation  Given his elevated troponin on this admission he will need cardiac clearance if any procedures are needed from a GI standpoint.  PPI IV twice daily  Continue serial CBCs and transfuse PRN Avoid NSAIDs Maintain 2 large-bore IV lines Please page GI with any acute hemodynamic changes, or signs of active GI bleeding  Pt signed  out to Dr. Marius Ditch as she is on call for the weekend    LOS: 2 days   Gregory Antigua, MD 05/16/2018, 4:13 PM

## 2018-05-16 NOTE — Clinical Social Work Note (Signed)
Clinical Social Work Assessment  Patient Details  Name: Gregory [place] MRN: [id number] Date of Birth: 16-Apr-1941  Date of referral:  05/16/18               Reason for consult:  Facility Placement                Permission sought to share information with:  Case Manager, Customer service manager Permission granted to share information::  Yes, Verbal Permission Granted  Name::     Gregory, Tyler [phone]  [phone] or   Agency::  [facility]  Relationship::     Contact Information:     Housing/Transportation Living arrangements for the past 2 months:  [place] of Information:  Adult Children Patient Interpreter Needed:  None Criminal Activity/Legal Involvement Pertinent to Current Situation/Hospitalization:  No - Comment as needed Significant Relationships:  Adult Children Lives with:  Facility Resident Do you feel safe going back to the place where you live?  Yes Need for family participation in patient care:  Yes (Comment)  Care giving concerns:  Patient's family have not reported any concerns about patient returning back to SNF.   Social Worker assessment / plan:  Patient is a 78 year old male who is alert and oriented x1.  Patient is a poor historian, CSW reviewed information by looking over patient's chart.  Patient has been at [facility] since August of 2019, per medical record patient is Medicaid pending at [facility].  Patient's son reported in the chart that he would like patient to return back to SNF via EMS once he is medically ready.  Patient's son reported to palliative team, that he would like hospice services to follow patient once he returns to SNF.  Employment status:  Retired Forensic scientist:  Medicare PT Recommendations:  Not assessed at this time Information / Referral to community resources:     Patient/Family's Response to care:  Patient's family would like him to return to SNF with hospice services  to follow.  Patient/Family's Understanding of and Emotional Response to Diagnosis, Current Treatment, and Prognosis:  Patient's son would like patient to return once he is medically ready for discharge.  Emotional Assessment Appearance:  Appears stated age Attitude/Demeanor/Rapport:    Affect (typically observed):  Appropriate Orientation:  Oriented to Self Alcohol / Substance use:  Not Applicable Psych involvement (Current and /or in the community):  No (Comment)  Discharge Needs  Concerns to be addressed:  Care Coordination Readmission within the last 30 days:  Yes Current discharge risk:  Cognitively Impaired Barriers to Discharge:  Continued Medical Work up   Gregory [place] 05/16/2018, 6:16 PM

## 2018-05-16 NOTE — NC FL2 (Addendum)
[place] LEVEL OF CARE SCREENING TOOL     IDENTIFICATION  Patient Name: Gregory Tyler Birthdate: [birth year] Sex: male Admission Date (Current Location): 05/14/2018  [place] and [place] Number:  Engineering geologist and Address:  [facility], [address] [place], [place] [address]      Provider Number: [id number]  Attending Physician Name and Address:  Otila Back, MD  Relative Name and Phone Number:    Marquelle, [place] [phone]  [phone]  Lynnell Chad Daughter   [phone]        Current Level of Care: Hospital Recommended Level of Care: [place] Prior Approval Number:    Date Approved/Denied:   PASRR Number: [id number]  Discharge Plan: SNF    Current Diagnoses: Patient Active Problem List   Diagnosis Date Noted  . Goals of care, counseling/discussion   . Rectal bleed 05/14/2018  . Elevated troponin 05/14/2018  . UTI (urinary tract infection) 05/14/2018  . Wound infection 04/21/2018  . Atrial fibrillation with RVR ([place]) 04/07/2018  . Protein-calorie malnutrition, severe 01/21/2018  . Acute renal failure superimposed on chronic kidney disease ([place])   . Palliative care by specialist   . DNR (do not resuscitate)   . Sepsis ([place]) 01/13/2018  . 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 Diarrhea 12/19/2017  . Pressure injury of skin 11/16/2017  . Enteritis 11/15/2017  . Malnutrition of moderate degree 09/13/2017  . Elevated INR 09/02/2017  . Paroxysmal atrial fibrillation ([place]) 10/25/2016  . Weakness generalized   . Hyperlipidemia 08/08/2015  . Diabetes ([place]) 05/19/2015  . Hyperkalemia   . Dehydration   . Orthostatic hypotension 05/09/2015  . Congestive dilated cardiomyopathy ([place]) 05/09/2015  . Polypharmacy 04/27/2015  . Chronic combined systolic and diastolic CHF (congestive heart failure) ([place]) 04/25/2015  . SOB (shortness of breath)   . Coronary artery disease  involving native coronary artery of native heart without angina pectoris   . Essential hypertension     Orientation RESPIRATION BLADDER Height & Weight     Self, Place  O2(2L) Incontinent, Continent Weight: 156 lb 6.4 oz (70.9 kg) Height:  5\' 8"  (172.7 cm)  BEHAVIORAL SYMPTOMS/MOOD NEUROLOGICAL BOWEL NUTRITION STATUS      Incontinent Diet(Regular)  AMBULATORY STATUS COMMUNICATION OF NEEDS Skin   Extensive Assist Verbally Other (Comment), Wound Vac                       Personal Care Assistance Level of Assistance  Bathing, Feeding, Dressing Bathing Assistance: Maximum assistance Feeding assistance: Limited assistance Dressing Assistance: Maximum assistance Total Care Assistance: Maximum assistance   Functional Limitations Info  Sight, Hearing, Speech Sight Info: Adequate Hearing Info: Adequate Speech Info: Adequate    SPECIAL CARE FACTORS FREQUENCY  PT (By licensed PT), OT (By licensed OT)     PT Frequency: min 2x a week OT Frequency: min 2x a week            Contractures Contractures Info: Not present    Additional Factors Info  Code Status, Allergies, Insulin Sliding Scale Code Status Info: DNR Allergies Info: ASPIRIN, TUBERCULIN TESTS, CODEINE    Insulin Sliding Scale Info: insulin aspart (novoLOG) injection 0-9 Units 3x a day with meals       Current Medications (05/16/2018):  This is the current hospital active medication list Current Facility-Administered Medications  Medication Dose Route Frequency Provider Last Rate Last Dose  . 0.9 %  sodium chloride infusion  10 mL/hr Intravenous Once  Lance Coon, MD      . acetaminophen (TYLENOL) tablet 650 mg  650 mg Oral Q6H PRN Lance Coon, MD   650 mg at 05/16/18 1551   Or  . acetaminophen (TYLENOL) suppository 650 mg  650 mg Rectal Q6H PRN Lance Coon, MD      . albuterol (PROVENTIL) (2.5 MG/3ML) 0.083% nebulizer solution 3 mL  3 mL Inhalation Q4H PRN Lance Coon, MD      . cefTRIAXone (ROCEPHIN) 1  g in sodium chloride 0.9 % 100 mL IVPB  1 g Intravenous Q24H Awilda Bill, NP 200 mL/hr at 05/16/18 1825 1 g at 05/16/18 1825  . collagenase (SANTYL) ointment   Topical Daily Ojie, Jude, MD      . fentaNYL (SUBLIMAZE) injection 25-50 mcg  25-50 mcg Intravenous Q2H PRN Wilhelmina Mcardle, MD   25 mcg at 05/15/18 1115  . insulin aspart (novoLOG) injection 0-5 Units  0-5 Units Subcutaneous QHS Lance Coon, MD      . insulin aspart (novoLOG) injection 0-9 Units  0-9 Units Subcutaneous TID WC Lance Coon, MD   1 Units at 05/16/18 1820  . nutrition supplement (JUVEN) (JUVEN) powder packet 1 packet  1 packet Oral BID BM Awilda Bill, NP      . ondansetron (ZOFRAN) injection 4 mg  4 mg Intravenous Q6H PRN Lance Coon, MD      . pantoprazole (PROTONIX) injection 40 mg  40 mg Intravenous Q12H Darel Hong D, NP   40 mg at 05/16/18 0959  . rosuvastatin (CRESTOR) tablet 20 mg  20 mg Oral Corwin Levins, MD   20 mg at 05/15/18 2110  . sodium bicarbonate 150 mEq in dextrose 5 % 1,000 mL infusion   Intravenous Continuous Wilhelmina Mcardle, MD 50 mL/hr at 05/16/18 1400    . tiotropium (SPIRIVA) inhalation capsule ([facility] use ONLY) 18 mcg  18 mcg Inhalation Daily Lance Coon, MD   18 mcg at 05/15/18 0902  . vitamin C (ASCORBIC ACID) tablet 250 mg  250 mg Oral BID Awilda Bill, NP   250 mg at 05/16/18 0272     Discharge Medications: Please see discharge summary for a list of discharge medications.  Relevant Imaging Results:  Relevant Lab Results:   Additional Information SS# [id number]  Family wants hospice to follow  [place], Marcelo Baldy

## 2018-05-17 LAB — TYPE AND SCREEN
ABO/RH(D): O POS
Antibody Screen: NEGATIVE
Unit division: 0

## 2018-05-17 LAB — BPAM RBC
Blood Product Expiration Date: 202002112359
ISSUE DATE / TIME: 202001222003
Unit Type and Rh: 5100

## 2018-05-17 LAB — URINE CULTURE: Culture: >=100000 — AB

## 2018-05-17 LAB — GLUCOSE, CAPILLARY
Glucose-Capillary: 98 mg/dL (ref 70–99)
Glucose-Capillary: 128 mg/dL — ABNORMAL HIGH (ref 70–99)

## 2018-05-17 LAB — PROCALCITONIN: Procalcitonin: 1.18 ng/mL

## 2018-05-17 MED ORDER — PANTOPRAZOLE SODIUM 40 MG PO TBEC: 40.0000 mg | DELAYED_RELEASE_TABLET | Freq: Two times a day (BID) | ORAL | 0 refills | Status: AC

## 2018-05-17 MED ORDER — CIPROFLOXACIN HCL 500 MG PO TABS: 500.0000 mg | ORAL_TABLET | Freq: Two times a day (BID) | ORAL | 0 refills | Status: AC

## 2018-05-17 MED ORDER — HYDROCODONE-ACETAMINOPHEN 5-325 MG PO TABS: 1.0000 | ORAL_TABLET | Freq: Four times a day (QID) | ORAL | 0 refills | Status: AC | PRN

## 2018-05-17 MED ORDER — SODIUM CHLORIDE 0.9 % IV SOLN
1.0000 g | INTRAVENOUS | Status: DC
  Administered 2018-05-17: 1 g via INTRAVENOUS
  Filled 2018-05-17: qty 1

## 2018-05-17 NOTE — Discharge Instructions (Signed)
Rectal Bleeding    Rectal bleeding is when blood comes out of the opening of the butt (anus). People with this kind of bleeding may notice bright red blood in their underwear or in the toilet after they poop (have a bowel movement). They may also have dark red or black poop (stool). Rectal bleeding is often a sign that something is wrong. It needs to be checked by a doctor.  Follow these instructions at home:  Watch for any changes in your condition. Take these actions to help with bleeding and discomfort:  Eat a diet that is high in fiber. This will keep your poop soft so it is easier for you to poop without pushing too hard. Ask your doctor to tell you what foods and drinks are high in fiber.  Drink enough fluid to keep your pee (urine) clear or pale yellow. This also helps keep your poop soft.  Try taking a warm bath. This may help with pain.  Keep all follow-up visits as told by your doctor. This is important.  Get help right away if:  You have new bleeding.  You have more bleeding than before.  You have black or dark red poop.  You throw up (vomit) blood or something that looks like coffee grounds.  You have pain or tenderness in your belly (abdomen).  You have a fever.  You feel weak.  You feel sick to your stomach (nauseous).  You pass out (faint).  You have very bad pain in your butt.  You cannot poop.  This information is not intended to replace advice given to you by your health care provider. Make sure you discuss any questions you have with your health care provider.  Document Released: 12/20/2010 Document Revised: 09/15/2015 Document Reviewed: 06/05/2015  Elsevier Interactive Patient Education  2019 Elsevier Inc[employer]

## 2018-05-17 NOTE — Consult Note (Signed)
Pharmacy Antibiotic Note  Gregory Tyler is a 78 y.o. male admitted on 05/14/2018 with UTI. UCx showing >=100,000 COLONIES/mL PSEUDOMONAS AERUGINOSA.  Pharmacy has been consulted for Cefepime dosing.  Plan: Start Cefepime 1g IV every 24 hours based on current CrCl <67ml/min.   Height: 5\' 8"  (172.7 cm) Weight: 159 lb 9.6 oz (72.4 kg) IBW/kg (Calculated) : 68.4  Temp (24hrs), Avg:97.8 F (36.6 C), Min:97.5 F (36.4 C), Max:98.1 F (36.7 C)  Recent Labs  Lab 05/14/18 1931 05/14/18 1946 05/15/18 0304 05/15/18 0546 05/15/18 0755 05/15/18 1425 05/16/18 0337  WBC 15.2*  --  14.9*  --   --   --  15.4*  CREATININE 5.90*  --  5.78*  --  5.65* 5.84* 5.68*  LATICACIDVEN  --  4.6* 2.4* 2.6*  --   --   --     Estimated Creatinine Clearance: 10.4 mL/min (A) (by C-G formula based on SCr of 5.68 mg/dL (H)).    Allergies  Allergen Reactions  . Aspirin Other (See Comments)    GI Bleeding  . Tuberculin Tests Other (See Comments)    unknown  . Codeine Other (See Comments)    BLISTERS    Antimicrobials this admission: 1/23 cefepime>>> x 1 dose 1/23 ceftriaxone >> 1/25 1/25 Cefepime >>   Microbiology results: 1/22 BCx: NG TD 1/22  UCx: >=100,000 COLONIES/mL PSEUDOMONAS AERUGINOSAAbnormal  1/23 MRSA PCR: negative  Thank you for allowing pharmacy to be a part of this patient's care.  Pernell Dupre, PharmD, BCPS Clinical Pharmacist 05/17/2018 9:05 AM

## 2018-05-17 NOTE — Discharge Summary (Addendum)
[facility] NAME: Gregory Tyler    MR#:  [id number]  DATE OF BIRTH:  03-03-1941  DATE OF ADMISSION:  05/14/2018 ADMITTING PHYSICIAN: Lance Coon, MD  DATE OF DISCHARGE: 05/17/2018  PRIMARY CARE PHYSICIAN: [name], MD   ADMISSION DIAGNOSIS:  Hypocalcemia [E83.51] Hyperkalemia [E87.5] Hypoxia [R09.02] Elevated troponin I level [R79.89] Acute renal failure, unspecified acute renal failure type ([place]) [N17.9] Gastrointestinal hemorrhage, unspecified gastrointestinal hemorrhage type [K92.2]  DISCHARGE DIAGNOSIS:  Sepsis secondary to urinary tract infection Urinary tract infection Acute kidney injury Metabolic acidosis Hyperkalemia Gastrointestinal bleeding Elevated troponin Paroxysmal atrial fibrillation Metabolic encephalopathy Nephrolithiasis  SECONDARY DIAGNOSIS:   Past Medical History:  Diagnosis Date  . Anemia   . Anxiety   . Arthritis   . Cardiomyopathy ([place])    a. 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  Marland Kitchen Chronic combined systolic (congestive) and diastolic (congestive) heart failure ([place])    a. 05/2015 TEE: EF 35-40% (in setting of Afib); b. 08/2017 Echo: EF 55-60%, no rwma, Gr2DD; b. 01/2018 Echo: EF 55-60%, mild MR. Nl LA size. Nl RV fxn.  . Chronic Pelvic pain   . Coronary artery disease    a. 05/2015 CTA Chest: 3 vessel coronary Ca2+.  . Depression   . Diabetes mellitus without complication ([place])   . Emphysema lung ([place])   . Gastric ulcer   . Gout   . Hyperlipidemia   . Hypertension   . Nephrolithiasis   . PAF (paroxysmal atrial fibrillation) ([place])    a. 05/2015 s/p DCCV; b. 03/2016 s/p DCCV; c. CHA2DS2VASc = 6 (Eliquis).  . Prostate cancer [facility])    Prostate  . Stage III chronic kidney disease ([place])   . Weight loss    50 lb since Apr 24, 2015     ADMITTING HISTORY Gregory Tyler  is a 78 y.o. male who presents with chief complaint as above. Patient presents from nursing  facility with complaint of rectal bleeding.  Patient himself states that he has not had much to eat for the past 7 to 8 days.  He states that he has not had any desire to eat as he has had significant nausea whenever food was brought in.  On evaluation here in the ED he has significant renal dysfunction, with his creatinine greater than 6 today whereas his baseline was around 1.5.  His potassium was 7.1 with some EKG abnormalities.  He appears to have a UTI and does state that his been having some dysuria.  His hemoglobin is okay at 10.8, though he was guaiac positive in the ED.  Hospitalist were called for admission  HOSPITAL COURSE:  Patient was admitted to ICU for sepsis, urinary tract infection, hyperkalemia and metabolic acidosis with worsening renal function.  Nephrology consult was done along with intensivist consultation and gastroenterology consultation.  Patient received Veltassa for hyperkalemia.  Patient also had elevated troponin during the presentation in the emergency room.  Patient was started on IV cefepime antibiotic.  Urine culture grew Pseudomonas.  Endoscopy was deferred because of patient having several acute medical issues.  He received IV proton pump inhibitor.  Hemoglobin was closely monitored.  Abnormal liver function tests were also trended.  Patient had history of shock liver.  Patient was managed with IV antibiotics for sepsis secondary to pneumonia from aspiration and urinary tract infection.  Echocardiogram was done which showed EF of 50 to 55%.  Regional wall motion abnormality could  not be excluded.  Patient was worked up with abdominal ultrasound and chest x-ray. As blood pressure was on the lower side blood pressure medications could not be started.  Eliquis also held secondary to gastrointestinal bleeding.  Palliative care service team also evaluated the patient during hospitalization.  Patient was made DNR by [facility].  Team meeting was done with palliative care team and  patient's family along with the hospitalist team and nephrology attending.  Patient's family but his son who is the next healthcare power of attorney wants to pursue comfort path and they do not want any dialysis for renal failure.  They want patient to be transition back to [facility] with hospice support.  They do not want any artificial feeding such as PEG tube and IV fluids.  Patient's family do not want any aggressive medical intervention as well as laboratory work-up.Patient will be discharged to [facility] with hospice support services.  If any further worsening of the condition patient family want patient to be transition to hospice facility.  They do not want any readmissions to the acute care hospital.  As patient has renal failure will hold off on metformin.  As patient's blood pressure is borderline we will hold off on hypertensive medication.  We will hold off on Eliquis secondary to GI bleed.  Patient will be put on oral ciprofloxacin antibiotic for UTI.  High risk of morbidity and mortality.  Long-term prognosis is poor considering multiple medical problems.  Hospice services to follow and extend care for the patient at the facility  CONSULTS OBTAINED:  Treatment Team:  Virgel Manifold, MD Lavonia Dana, MD  DRUG ALLERGIES:   Allergies  Allergen Reactions  . Aspirin Other (See Comments)    GI Bleeding  . Tuberculin Tests Other (See Comments)    unknown  . Codeine Other (See Comments)    BLISTERS     DISCHARGE MEDICATIONS:   Allergies as of 05/17/2018      Reactions   Aspirin Other (See Comments)   GI Bleeding   Tuberculin Tests Other (See Comments)   unknown   Codeine Other (See Comments)   BLISTERS      Medication List    STOP taking these medications   allopurinol 300 MG tablet Commonly known as:  ZYLOPRIM   apixaban 5 MG Tabs tablet Commonly known as:  ELIQUIS   Digoxin 62.5 MCG Tabs   diltiazem 30 MG tablet Commonly known as:   CARDIZEM   gabapentin 100 MG capsule Commonly known as:  NEURONTIN   metFORMIN 500 MG tablet Commonly known as:  GLUCOPHAGE   metoprolol tartrate 25 MG tablet Commonly known as:  LOPRESSOR   rosuvastatin 20 MG tablet Commonly known as:  CRESTOR   torsemide 20 MG tablet Commonly known as:  DEMADEX     TAKE these medications   acetaminophen 325 MG tablet Commonly known as:  TYLENOL Take 2 tablets (650 mg total) by mouth every 6 (six) hours as needed for mild pain (or Fever >/= 101).   albuterol 108 (90 Base) MCG/ACT inhaler Commonly known as:  PROVENTIL HFA;VENTOLIN HFA Inhale 1 puff into the lungs every 4 (four) hours as needed for wheezing or shortness of breath.   ARTIFICIAL TEARS 0.4 % Soln Generic drug:  Hypromellose Place 1 drop into both eyes 2 (two) times daily.   ciprofloxacin 500 MG tablet Commonly known as:  CIPRO Take 1 tablet (500 mg total) by mouth 2 (two) times daily for 7 days.  collagenase ointment Commonly known as:  SANTYL Apply 1 application topically every evening. Apply to eschar on RLE wound   HYDROcodone-acetaminophen 5-325 MG tablet Commonly known as:  NORCO/VICODIN Take 1 tablet by mouth every 6 (six) hours as needed for moderate pain or severe pain.   ipratropium-albuterol 0.5-2.5 (3) MG/3ML Soln Commonly known as:  DUONEB Take 3 mLs by nebulization every 8 (eight) hours as needed (COPD/SHOB).   pantoprazole 40 MG tablet Commonly known as:  PROTONIX Take 1 tablet (40 mg total) by mouth 2 (two) times daily for 30 days.   tiotropium 18 MCG inhalation capsule Commonly known as:  SPIRIVA Place 18 mcg into inhaler and inhale daily.   vitamin A 8000 UNIT capsule Take 8,000 Units by mouth daily.   vitamin C 250 MG tablet Commonly known as:  ASCORBIC ACID Take 250 mg by mouth 2 (two) times daily.   zinc gluconate 50 MG tablet Take 50 mg by mouth daily.       Today  Patient seen and evaluated today Lying on the bed on oxygen via  nasal cannula at 1 L And responds to loud verbal commands and painful stimuli Vital signs appear to be stable Will be discharged to [facility] with hospice support services Long-term prognosis poor VITAL SIGNS:  Blood pressure 105/68, pulse 88, temperature (!) 97.5 F (36.4 C), temperature source Oral, resp. rate 18, height 5\' 8"  (1.727 m), weight 72.4 kg, SpO2 100 %.  I/O:    Intake/Output Summary (Last 24 hours) at 05/17/2018 1023 Last data filed at 05/17/2018 0449 Gross per 24 hour  Intake 964.64 ml  Output 493 ml  Net 471.64 ml    PHYSICAL EXAMINATION:  Physical Exam  GENERAL:  78 y.o.-year-old patient lying in the bed with no acute distress.  LUNGS: Normal breath sounds bilaterally, no wheezing, rales,rhonchi or crepitation. No use of accessory muscles of respiration.  CARDIOVASCULAR: S1, S2 normal. No murmurs, rubs, or gallops.  ABDOMEN: Soft, non-tender, non-distended. Bowel sounds present. No organomegaly or mass.  NEUROLOGIC: Moves all 4 extremities. PSYCHIATRIC: The patient is alert and oriented x 3.  SKIN: No obvious rash, lesion, or ulcer.   DATA REVIEW:   CBC Recent Labs  Lab 05/16/18 0337  WBC 15.4*  HGB 10.6*  HCT 32.9*  PLT 233    Chemistries  Recent Labs  Lab 05/15/18 0304  05/16/18 0337  NA 134*   < > 135  K 6.0*   < > 4.3  CL 101   < > 99  CO2 14*   < > 17*  GLUCOSE 83   < > 120*  BUN 116*   < > 119*  CREATININE 5.78*   < > 5.68*  CALCIUM 6.3*   < > 5.6*  MG  --   --  1.7  AST 79*  --   --   ALT 125*  --   --   ALKPHOS 255*  --   --   BILITOT 0.8  --   --    < > = values in this interval not displayed.    Cardiac Enzymes Recent Labs  Lab 05/15/18 0546  TROPONINI 0.18*    Microbiology Results  Results for orders placed or performed during the hospital encounter of 05/14/18  Urine Culture     Status: Abnormal   Collection Time: 05/14/18  7:46 PM  Result Value Ref Range Status   Specimen Description   Final    URINE,  RANDOM Performed at [facility]  [facility], [address], [place], [place] [address]    Special Requests   Final    NONE Performed at [facility], [place], [place] [address]    Culture >=100,000 COLONIES/mL PSEUDOMONAS AERUGINOSA (A)  Final   Report Status 05/17/2018 FINAL  Final   Organism ID, Bacteria PSEUDOMONAS AERUGINOSA (A)  Final      Susceptibility   Pseudomonas aeruginosa - MIC*    CEFTAZIDIME 4 SENSITIVE Sensitive     CIPROFLOXACIN <=0.25 SENSITIVE Sensitive     GENTAMICIN <=1 SENSITIVE Sensitive     IMIPENEM <=0.25 SENSITIVE Sensitive     PIP/TAZO 8 SENSITIVE Sensitive     CEFEPIME 2 SENSITIVE Sensitive     * >=100,000 COLONIES/mL PSEUDOMONAS AERUGINOSA  MRSA PCR Screening     Status: None   Collection Time: 05/15/18  1:12 AM  Result Value Ref Range Status   MRSA by PCR NEGATIVE NEGATIVE Final    Comment:        The GeneXpert MRSA Assay (FDA approved for NASAL specimens only), is one component of a comprehensive MRSA colonization surveillance program. It is not intended to diagnose MRSA infection nor to guide or monitor treatment for MRSA infections. Performed at [facility], [address], [place], [place] [address]   CULTURE, BLOOD (ROUTINE X 2) w Reflex to ID Panel     Status: None (Preliminary result)   Collection Time: 05/15/18  3:04 AM  Result Value Ref Range Status   Specimen Description BLOOD LEFT AC  Final   Special Requests   Final    BOTTLES DRAWN AEROBIC AND ANAEROBIC Blood Culture results may not be optimal due to an excessive volume of blood received in culture bottles   Culture   Final    NO GROWTH 2 DAYS Performed at [facility], [address], [place], [place] [address]    Report Status PENDING  Incomplete  CULTURE, BLOOD (ROUTINE X 2) w Reflex to ID Panel     Status: None (Preliminary result)   Collection Time: 05/15/18  3:07 AM  Result Value Ref Range Status   Specimen Description BLOOD  LEFT WRIST  Final   Special Requests   Final    BOTTLES DRAWN AEROBIC AND ANAEROBIC Blood Culture adequate volume   Culture   Final    NO GROWTH 2 DAYS Performed at [facility], [address] [place], [place] [address]    Report Status PENDING  Incomplete    RADIOLOGY:  US Abdomen Complete  Result Date: 05/15/2018 CLINICAL DATA:  Elevated LFTs, history CHF, coronary artery disease, cardiomyopathy, diabetes mellitus, hypertension, stage III chronic kidney disease, atrial fibrillation, nephrolithiasis EXAM: ABDOMEN ULTRASOUND COMPLETE COMPARISON:  Renal and abdominal ultrasound exams of 01/14/2018 FINDINGS: Gallbladder: Surgically absent Common bile duct: Diameter: 4 mm diameter, normal Liver: Upper normal echogenicity. No mass or nodularity. Portal vein is patent on color Doppler imaging with normal direction of blood flow towards the liver. IVC: Normal appearance Pancreas: Inadequately visualized due to bowel gas Spleen: Normal appearance, 4.2 cm length Right Kidney: Length: 9.8 cm. Normal cortical thickness. Increased cortical echogenicity. No mass or hydronephrosis. Left Kidney: Length: 9.7 cm. Normal cortical thickness. Increased cortical echogenicity. No mass or hydronephrosis. 7 mm mildly shadowing echogenic focus at mid LEFT kidney likely small nonobstructing calculus. Abdominal aorta: Atherosclerotic changes of aorta without aneurysm. Other findings: Small BILATERAL pleural effusions.  No ascites. IMPRESSION: Post cholecystectomy. Probable nonobstructing 7 mm mid LEFT renal calculus. Medical renal  disease changes of both kidneys. BILATERAL pleural effusions. Electronically Signed   By: Lavonia Dana M.D.   On: 05/15/2018 10:34    Follow up with PCP in 1 week.  Management plans discussed with the patient, family and they are in agreement.  CODE STATUS: DNR    Code Status Orders  (From admission, onward)         Start     Ordered   05/14/18 2352  Do not attempt  resuscitation (DNR)  Continuous    Question Answer Comment  In the event of cardiac or respiratory ARREST Do not call a "code blue"   In the event of cardiac or respiratory ARREST Do not perform Intubation, CPR, defibrillation or ACLS   In the event of cardiac or respiratory ARREST Use medication by any route, position, wound care, and other measures to relive pain and suffering. May use oxygen, suction and manual treatment of airway obstruction as needed for comfort.      05/14/18 2351        Code Status History    Date Active Date Inactive Code Status Order ID Comments User Context   04/07/2018 1848 04/10/2018 1828 DNR [id number]  Henreitta Leber, MD Inpatient   01/20/2018 1242 01/30/2018 2345 DNR [id number]  Knox Royalty, NP Inpatient   01/19/2018 1329 01/20/2018 1242 Full Code [id number]  Gorden Harms, MD Inpatient   01/13/2018 1712 01/19/2018 1329 DNR [id number]  Dustin Flock, MD Inpatient   12/19/2017 1932 12/20/2017 2151 DNR [id number]  [name], MD Inpatient   11/17/2017 1357 11/19/2017 1603 DNR [id number]  Hermelinda Dellen, DO Inpatient   11/15/2017 1550 11/17/2017 1357 Full Code [id number]  Loletha Grayer, MD ED   09/11/2017 1951 09/15/2017 1603 Full Code [id number]  Bettey Costa, MD ED   05/11/2015 1307 05/13/2015 1256 Full Code [id number]  Aldean Jewett, MD Inpatient   04/25/2015 0149 04/27/2015 1741 Full Code [id number]  Hillary Bow, MD ED    Advance Directive Documentation     Most Recent Value  Type of Advance Directive  Out of facility DNR (pink MOST or yellow form)  Pre-existing out of facility DNR order (yellow form or pink MOST form)  -  "MOST" Form in Place?  -      TOTAL TIME TAKING CARE OF THIS PATIENT ON DAY OF DISCHARGE: more than 39 minutes.   Saundra Shelling M.D on 05/17/2018 at 10:23 AM  Between 7am to 6pm - Pager - [phone]  After 6pm go to [email or web address] - password [email or web address] [facility]  Office  [phone]  CC: Primary care  physician; [name], MD  Note: This dictation was prepared with Dragon dictation along with smaller phrase technology. Any transcriptional errors that result from this process are unintentional.

## 2018-05-17 NOTE — Progress Notes (Signed)
Per Dr. Estanislado Pandy, patient is to return to SNF w/ Foley catheter. Informed SNF nurse. [place] and gave report to the receiving facility nurse at this time. All questions answered. [facility] and requested non-emergent patient transport to the receiving facility at this time. Awaiting arrival. Wenda Low [facility]

## 2018-05-17 NOTE — Clinical Social Work Note (Signed)
The patient will discharge to [facility] today via non-emergent EMS. The patient's family and the facility are aware and in agreement. The CSW has delivered the discharge packet and sent all needed documentation to both [facility] and [facility]. The CSW also answered any questions from the family concerning hospice care and provided emotional support. The CSW is now signing off. Please consult should needs arise.  Santiago Bumpers, MSW, Latanya Presser [phone]

## 2018-05-17 NOTE — Progress Notes (Signed)
[facility]  ROUNDING NOTE   Subjective:   Daughter and granddaughter at bedside.   Patient is asking to be sent home.   [facility] [id number]  Objective:  Vital signs in last 24 hours:  Temp:  [97.5 F (36.4 C)-98.1 F (36.7 C)] 97.5 F (36.4 C) (01/25 0823) Pulse Rate:  [88-100] 88 (01/25 0823) Resp:  [18] 18 (01/25 0823) BP: (95-122)/(68-84) 105/68 (01/25 0823) SpO2:  [89 %-100 %] 100 % (01/25 0823) Weight:  [72.4 kg] 72.4 kg (01/25 0446)  Weight change: 0.394 kg Filed Weights   05/16/18 0238 05/16/18 0329 05/17/18 0446  Weight: 72 kg 70.9 kg 72.4 kg    Intake/Output: I/O last 3 completed shifts: In: 2277.8 [P.O.:360; I.V.:1419.5; IV Piggyback:498.3] Out: 578 [Urine:486; Drains:90; Stool:2]   Intake/Output this shift:  No intake/output data recorded.  Physical Exam: General: NAD,   Head: Normocephalic, atraumatic. Moist oral mucosal membranes  Eyes: Anicteric, PERRL  Neck: Supple, trachea midline  Lungs:  Clear to auscultation  Heart: Regular rate and rhythm  Abdomen:  Soft, nontender,   Extremities: no peripheral edema. Right foot wound vac  Neurologic: Nonfocal, moving all four extremities  Skin: No lesions  Access: none    Basic Metabolic Panel: Recent Labs  Lab 05/14/18 1931 05/15/18 0304 05/15/18 0755 05/15/18 1425 05/16/18 0337  NA 132* 134* 134* 132* 135  K 7.1* 6.0* 5.2* 4.8 4.3  CL 101 101 102 101 99  CO2 11* 14* 11* 12* 17*  GLUCOSE 82 83 127* 177* 120*  BUN 116* 116* 121* 112* 119*  CREATININE 5.90* 5.78* 5.65* 5.84* 5.68*  CALCIUM 6.2* 6.3* 6.1* 5.9* 5.6*  MG 2.1  --   --   --  1.7  PHOS  --   --   --   --  9.9*    Liver Function Tests: Recent Labs  Lab 05/14/18 1931 05/15/18 0304  AST 83* 79*  ALT 129* 125*  ALKPHOS 266* 255*  BILITOT 0.6 0.8  PROT 6.2* 6.6  ALBUMIN 2.5* 2.5*   Recent Labs  Lab 05/14/18 1931  LIPASE 57*   No results for input(s): AMMONIA in the last 168 hours.  CBC: Recent Labs  Lab  05/14/18 1931 05/15/18 0304 05/16/18 0337  WBC 15.2* 14.9* 15.4*  NEUTROABS 12.7*  --   --   HGB 10.8* 11.9* 10.6*  HCT 34.9* 36.9* 32.9*  MCV 90.6 88.3 87.5  PLT 338 288 233    Cardiac Enzymes: Recent Labs  Lab 05/14/18 1931 05/15/18 0304 05/15/18 0546  TROPONINI 0.19* 0.19* 0.18*    BNP: Invalid input(s): POCBNP  CBG: Recent Labs  Lab 05/16/18 0818 05/16/18 1149 05/16/18 1815 05/16/18 2103 05/17/18 0825  GLUCAP 105* 135* 140* 107* 43    Microbiology: Results for orders placed or performed during the hospital encounter of 05/14/18  Urine Culture     Status: Abnormal   Collection Time: 05/14/18  7:46 PM  Result Value Ref Range Status   Specimen Description   Final    URINE, RANDOM Performed at [facility], [address] [place], [place] [address]    Special Requests   Final    NONE Performed at [facility], [address] [place],  [address]    Culture >=100,000 COLONIES/mL PSEUDOMONAS AERUGINOSA (A)  Final   Report Status 05/17/2018 FINAL  Final   Organism ID, Bacteria PSEUDOMONAS AERUGINOSA (A)  Final      Susceptibility   Pseudomonas aeruginosa - MIC*    CEFTAZIDIME  4 SENSITIVE Sensitive     CIPROFLOXACIN <=0.25 SENSITIVE Sensitive     GENTAMICIN <=1 SENSITIVE Sensitive     IMIPENEM <=0.25 SENSITIVE Sensitive     PIP/TAZO 8 SENSITIVE Sensitive     CEFEPIME 2 SENSITIVE Sensitive     * >=100,000 COLONIES/mL PSEUDOMONAS AERUGINOSA  MRSA PCR Screening     Status: None   Collection Time: 05/15/18  1:12 AM  Result Value Ref Range Status   MRSA by PCR NEGATIVE NEGATIVE Final    Comment:        The GeneXpert MRSA Assay (FDA approved for NASAL specimens only), is one component of a comprehensive MRSA colonization surveillance program. It is not intended to diagnose MRSA infection nor to guide or monitor treatment for MRSA infections. Performed at [facility], [address], [place], [place] [address]    CULTURE, BLOOD (ROUTINE X 2) w Reflex to ID Panel     Status: None (Preliminary result)   Collection Time: 05/15/18  3:04 AM  Result Value Ref Range Status   Specimen Description BLOOD LEFT AC  Final   Special Requests   Final    BOTTLES DRAWN AEROBIC AND ANAEROBIC Blood Culture results may not be optimal due to an excessive volume of blood received in culture bottles   Culture   Final    NO GROWTH 2 DAYS Performed at [facility], [address], [place], [place] [address]    Report Status PENDING  Incomplete  CULTURE, BLOOD (ROUTINE X 2) w Reflex to ID Panel     Status: None (Preliminary result)   Collection Time: 05/15/18  3:07 AM  Result Value Ref Range Status   Specimen Description BLOOD LEFT WRIST  Final   Special Requests   Final    BOTTLES DRAWN AEROBIC AND ANAEROBIC Blood Culture adequate volume   Culture   Final    NO GROWTH 2 DAYS Performed at [facility], [address] [place], [place] [address]    Report Status PENDING  Incomplete    Coagulation Studies: Recent Labs    05/14/18 1931  LABPROT 27.7*  INR 2.63    Urinalysis: Recent Labs    05/14/18 [place] 1.014  PHURINE 6.0  GLUCOSEU NEGATIVE  HGBUR SMALL*  [place] 100*  NITRITE NEGATIVE  LEUKOCYTESUR MODERATE*      Imaging: US Abdomen Complete  Result Date: 05/15/2018 CLINICAL DATA:  Elevated LFTs, history CHF, coronary artery disease, cardiomyopathy, diabetes mellitus, hypertension, stage III chronic kidney disease, atrial fibrillation, nephrolithiasis EXAM: ABDOMEN ULTRASOUND COMPLETE COMPARISON:  Renal and abdominal ultrasound exams of 01/14/2018 FINDINGS: Gallbladder: Surgically absent Common bile duct: Diameter: 4 mm diameter, normal Liver: Upper normal echogenicity. No mass or nodularity. Portal vein is patent on color Doppler imaging with normal direction of blood flow towards the liver. IVC: Normal  appearance Pancreas: Inadequately visualized due to bowel gas Spleen: Normal appearance, 4.2 cm length Right Kidney: Length: 9.8 cm. Normal cortical thickness. Increased cortical echogenicity. No mass or hydronephrosis. Left Kidney: Length: 9.7 cm. Normal cortical thickness. Increased cortical echogenicity. No mass or hydronephrosis. 7 mm mildly shadowing echogenic focus at mid LEFT kidney likely small nonobstructing calculus. Abdominal aorta: Atherosclerotic changes of aorta without aneurysm. Other findings: Small BILATERAL pleural effusions.  No ascites. IMPRESSION: Post cholecystectomy. Probable nonobstructing 7 mm mid LEFT renal calculus. Medical renal disease changes of both kidneys. BILATERAL pleural effusions. Electronically Signed   By: Crist Infante.D.  On: 05/15/2018 10:34     Medications:   . sodium chloride    . ceFEPime (MAXIPIME) IV    .  sodium bicarbonate  infusion 1000 mL 50 mL/hr at 05/17/18 0150   . collagenase   Topical Daily  . insulin aspart  0-5 Units Subcutaneous QHS  . insulin aspart  0-9 Units Subcutaneous TID WC  . nutrition supplement (JUVEN)  1 packet Oral BID BM  . pantoprazole (PROTONIX) IV  40 mg Intravenous Q12H  . rosuvastatin  20 mg Oral QHS  . tiotropium  18 mcg Inhalation Daily  . vitamin C  250 mg Oral BID   acetaminophen **OR** acetaminophen, albuterol, fentaNYL (SUBLIMAZE) injection, [DISCONTINUED] ondansetron **OR** ondansetron (ZOFRAN) IV, oxyCODONE-acetaminophen  Assessment/ Plan:  [name] is a 78 y.o. 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 Acute renal failure with hyperkalemia, metabolic acidosis and hyponatremia and chronic kidney disease stage III: baseline creatinine of 1.31, GFR of 53  on 11/19 Requiring hemodialysis on previous hospitalization.  Patient is not wanting renal replacement therapy at this time.   2. Anemia with chronic kidney disease and GI bleed: status post PRBC transfusion on admission hemoglobin stable at 10.6  3. Urinary tract infection: grossly pyuric urine. Pseudomonas species.  - Empiric antibiotics: ceftriaxone   LOS: 3 Eboney Claybrook 1/25/202010:09 AM

## 2018-05-19 ENCOUNTER — Telehealth: Payer: Self-pay | Admitting: Gastroenterology

## 2018-05-19 NOTE — Telephone Encounter (Signed)
Left vm for pt to call office and schedule apt for 2-3 week f/u with Dr. Darene Lamer

## 2018-05-19 NOTE — Telephone Encounter (Signed)
-----   Message from Virgel Manifold, MD sent at 05/19/2018 10:11 AM EST -----  Please set up clinic appointment with me in 2-4 weeks

## 2018-05-20 ENCOUNTER — Ambulatory Visit: Payer: Medicare Other | Admitting: Physician Assistant

## 2018-05-20 LAB — CULTURE, BLOOD (ROUTINE X 2)
CULTURE: NO GROWTH
CULTURE: NO GROWTH
Special Requests: ADEQUATE

## 2018-05-23 ENCOUNTER — Telehealth: Payer: Self-pay | Admitting: Gastroenterology

## 2018-05-23 NOTE — Telephone Encounter (Signed)
-----   Message from Virgel Manifold, MD sent at 05/19/2018 10:11 AM EST -----  Please set up clinic appointment with me in 2-4 weeks

## 2018-05-23 NOTE — Telephone Encounter (Signed)
Left vm for pt to call office and schedule 2-4 week f/u with DR.T

## 2018-05-26 NOTE — Progress Notes (Incomplete)
NO SHOW

## 2018-05-27 ENCOUNTER — Encounter: Payer: Medicare Other | Admitting: Physician Assistant

## 2018-05-30 ENCOUNTER — Ambulatory Visit: Payer: Medicare Other | Admitting: Cardiovascular Disease

## 2018-06-02 ENCOUNTER — Encounter: Payer: Self-pay | Admitting: Cardiovascular Disease

## 2018-06-03 ENCOUNTER — Ambulatory Visit: Payer: Medicare Other | Admitting: Physician Assistant

## 2018-06-22 DEATH — deceased

## 2018-06-25 ENCOUNTER — Ambulatory Visit: Payer: Medicare Other | Admitting: Gastroenterology

## 2020-07-06 IMAGING — MR MR CERVICAL SPINE WO/W CM
8 series · 46 of 48 positions shown · IV contrast (14 mL Multihance)
Comparison: None.

CLINICAL DATA: Left-sided weakness for 3 months with left hand
numbness.

EXAM:
MRI CERVICAL SPINE WITHOUT AND WITH CONTRAST
TECHNIQUE: Multiplanar and multiecho pulse sequences of the cervical spine, to
include the craniocervical junction and cervicothoracic junction,
were obtained without and with intravenous contrast.
CONTRAST:  14mL MULTIHANCE GADOBENATE DIMEGLUMINE 529 MG/ML IV SOLN

[Series 7: T2 · sagittal · 3.0mm · 0.70mm/px · 5 of 15 slices shown (1 of 2)]
[im 1/15]
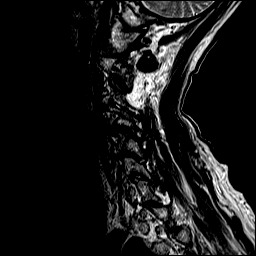
[im 4/15]
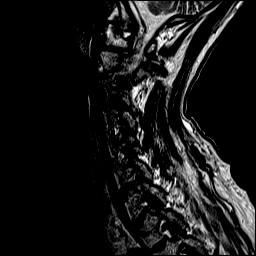
[im 8/15]
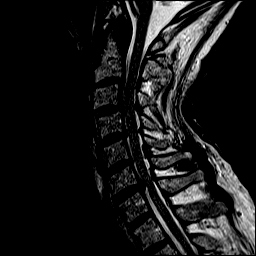
[im 11/15]
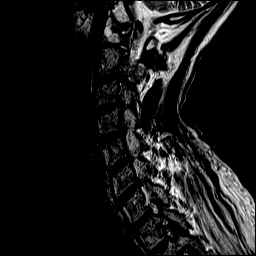
[im 15/15]
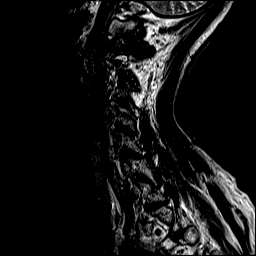

[Series 8: T1 · sagittal · 3.0mm · 0.70mm/px · 4 of 15 slices shown (1 of 2)]
[im 1/15]
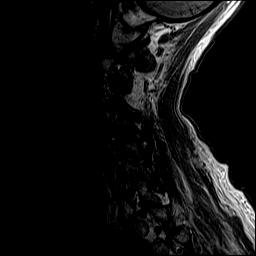
[im 5/15]
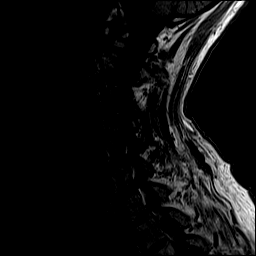
[im 10/15]
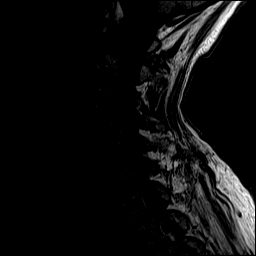
[im 15/15]
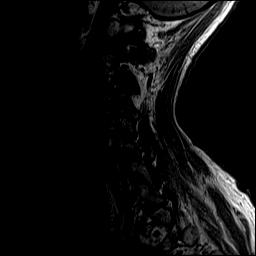

[Series 9: STIR · sagittal · 3.0mm · 0.70mm/px · 4 of 15 slices shown]
[im 1/15]
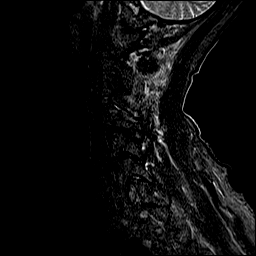
[im 5/15]
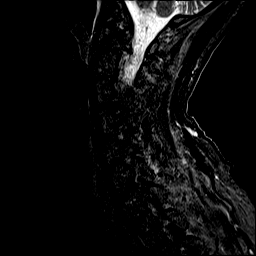
[im 10/15]
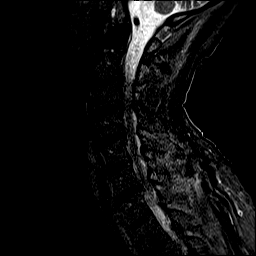
[im 15/15]
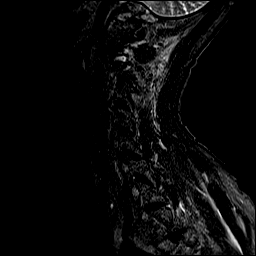

[Series 10: T2 · axial · 3.0mm · 0.70mm/px · z∈[-87,+17]mm · 8 of 29 slices shown (2 of 2)]
[im 1/29]
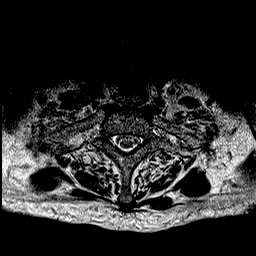
[im 5/29]
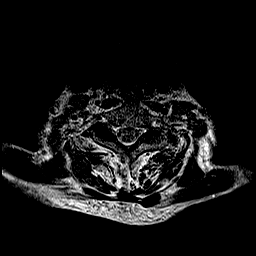
[im 9/29]
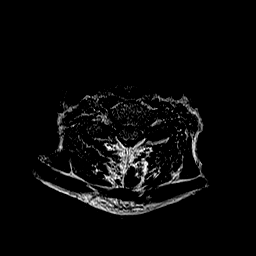
[im 13/29]
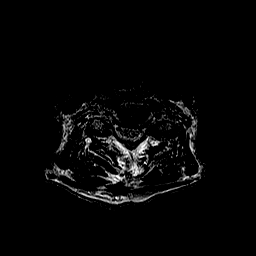
[im 17/29]
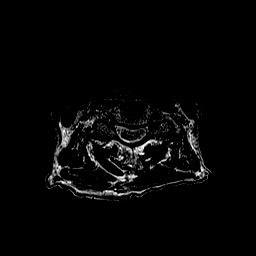
[im 21/29]
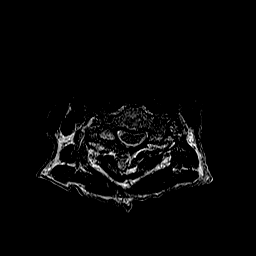
[im 25/29]
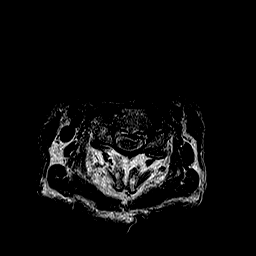
[im 29/29]
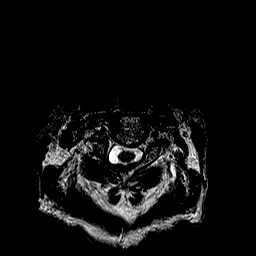

[Series 11: mpgr ax · axial · 3.0mm · 0.35mm/px · z∈[-87,-12]mm · 6 of 29 slices shown]
[im 1/29]
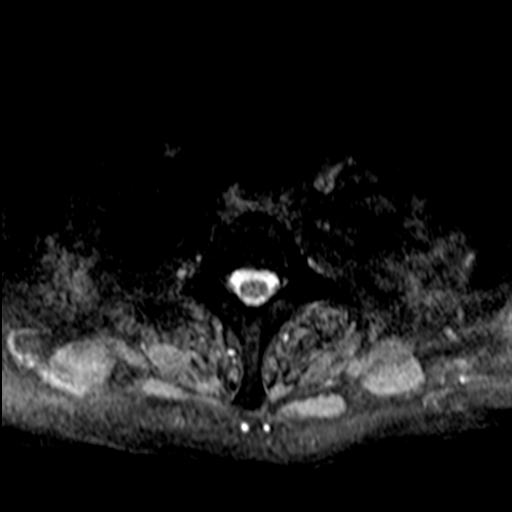
[im 5/29]
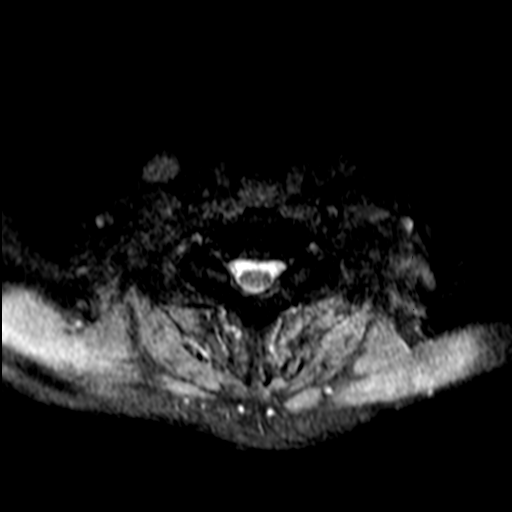
[im 9/29]
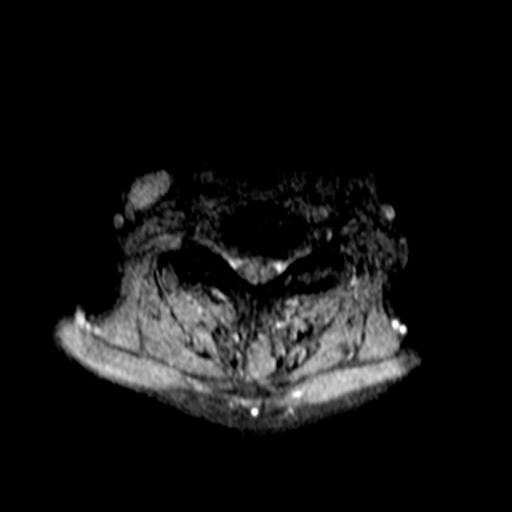
[im 13/29]
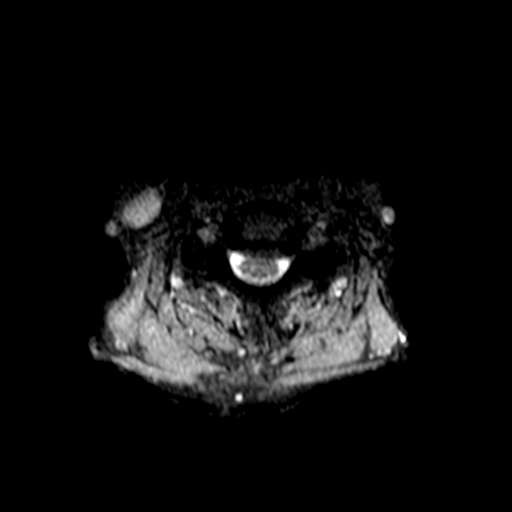
[im 17/29]
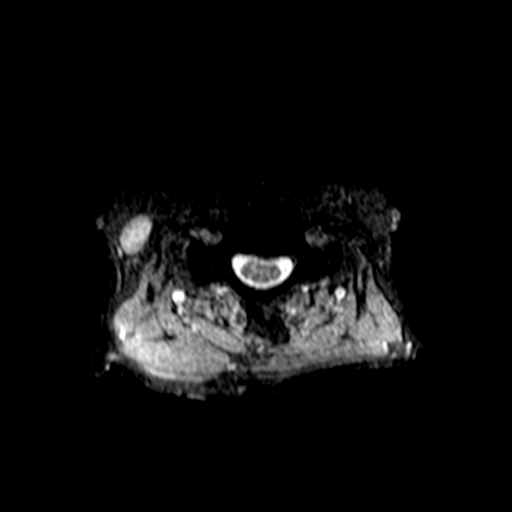
[im 21/29]
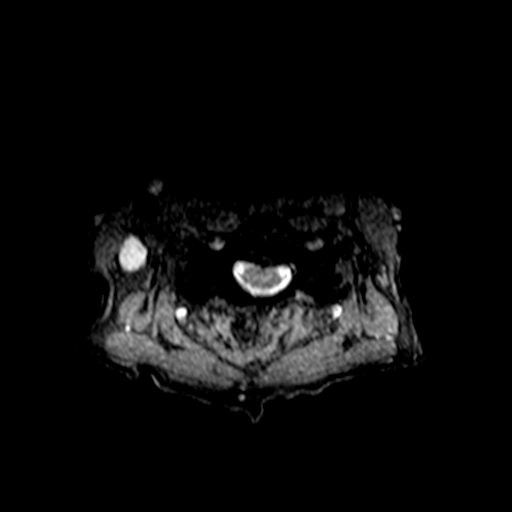

[Series 12: T1 · axial · 3.0mm · 0.70mm/px · z∈[-87,+17]mm · 8 of 28 slices shown (2 of 2)]
[im 1/28]
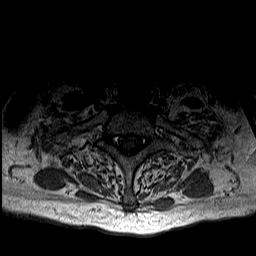
[im 4/28]
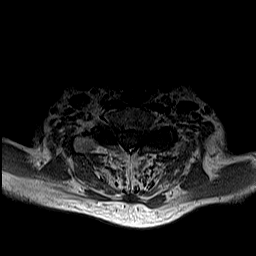
[im 8/28]
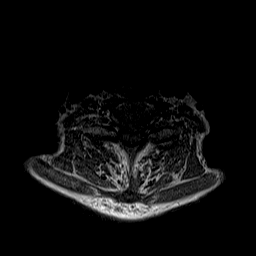
[im 12/28]
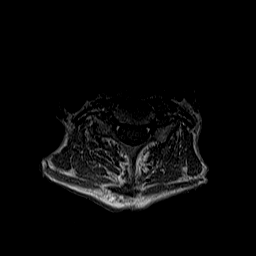
[im 16/28]
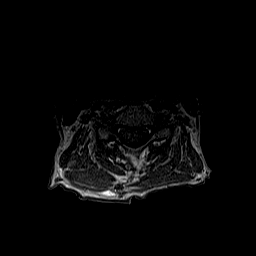
[im 20/28]
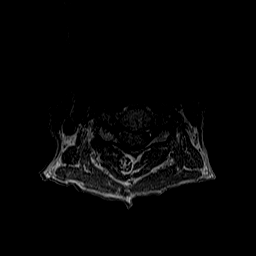
[im 24/28]
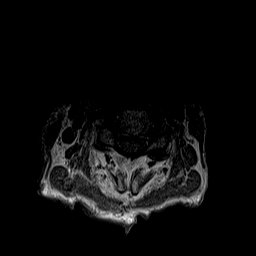
[im 28/28]
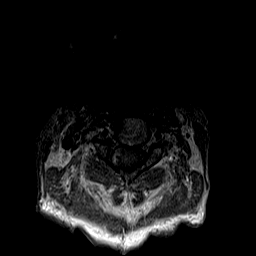

[Series 13: T1 fat-sat post-contrast · sagittal · 3.0mm · 0.70mm/px · 4 of 15 slices shown]
[im 1/15]
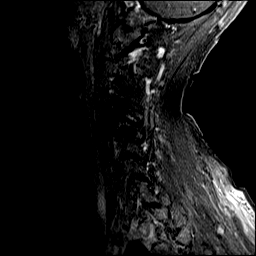
[im 5/15]
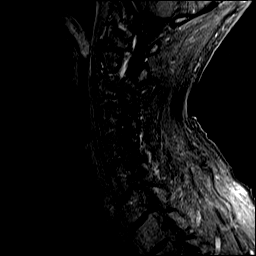
[im 10/15]
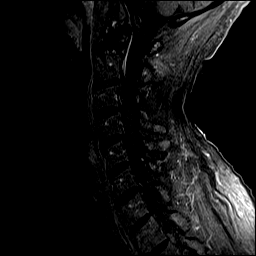
[im 15/15]
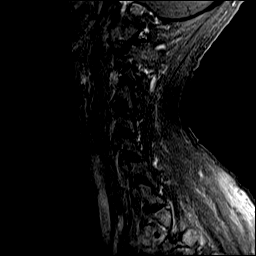

[Series 14: T1 post-contrast · axial · 3.0mm · 0.70mm/px · z∈[-87,+17]mm · 7 of 25 slices shown]
[im 1/25]
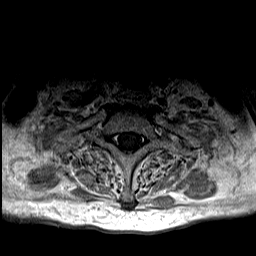
[im 5/25]
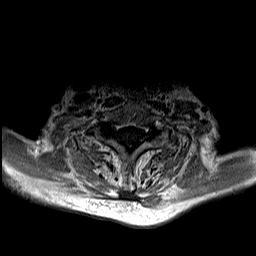
[im 9/25]
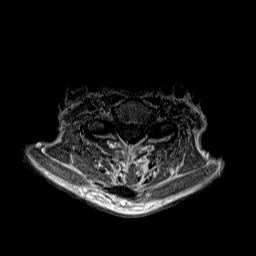
[im 13/25]
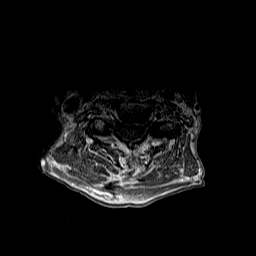
[im 17/25]
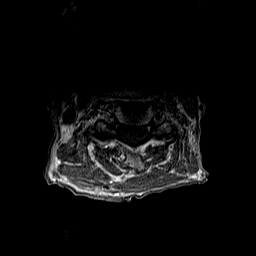
[im 21/25]
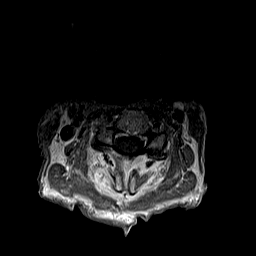
[im 25/25]
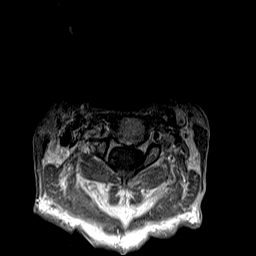

[46 of 48 positions shown; findings below may reference images not displayed]

FINDINGS: Alignment: Normal

Vertebrae: Mild right C3-4 facet edema. No focal marrow lesion. No
discitis-osteomyelitis.

Cord: Normal

Posterior Fossa, vertebral arteries, paraspinal tissues: Visualized
posterior fossa is normal. Vertebral artery flow voids are
preserved. No prevertebral soft tissue swelling.

Disc levels:

The C1-C2 level shows moderate hypertrophy. Foramen magnum is widely
patent.

C2-C3. Small disc protrusion narrows the ventral thecal sac without
causing spinal canal stenosis. There is moderate narrowing of the
left neural foramen.

C3-C4: Bilateral uncovertebral and facet hypertrophy. Mild central
spinal canal stenosis. Mild bilateral foraminal stenosis.

C4-C5: Intermediate sized central disc protrusion narrows the
ventral thecal sac and indents the spinal cord. Moderate central
spinal canal stenosis. Mild left foraminal stenosis.

C5-C6: No disc herniation. Mild facet hypertrophy. No spinal canal
stenosis. Mild left foraminal narrowing.

C6-C7: Large central disc protrusion effaces the ventral thecal sac
and indents the spinal cord. Moderate central spinal canal stenosis.
Mild bilateral foraminal stenosis.

C7-T1: Normal.

T1-T2: Imaged only in the sagittal plane.  Normal.

There is no abnormal contrast enhancement.
IMPRESSION: 1. C6-7 large central disc protrusion indenting the spinal cord and
causing moderate spinal canal stenosis.
2. C4-5 intermediate central disc protrusion indenting the spinal
cord and causing moderate spinal canal stenosis.
3. C2-C3 moderate left foraminal stenosis.
4. Mild foraminal stenosis at multiple levels.
